# Patient Record
Sex: Male | Born: 1940
Health system: Southern US, Community
[De-identification: ages and names within clinical notes are randomized; demographics above are authoritative.]

## PROBLEM LIST (undated history)

## (undated) DIAGNOSIS — J449 Chronic obstructive pulmonary disease, unspecified: Secondary | ICD-10-CM

## (undated) DIAGNOSIS — F419 Anxiety disorder, unspecified: Secondary | ICD-10-CM

## (undated) DIAGNOSIS — E785 Hyperlipidemia, unspecified: Secondary | ICD-10-CM

## (undated) DIAGNOSIS — R0902 Hypoxemia: Secondary | ICD-10-CM

## (undated) DIAGNOSIS — I2510 Atherosclerotic heart disease of native coronary artery without angina pectoris: Secondary | ICD-10-CM

## (undated) DIAGNOSIS — I219 Acute myocardial infarction, unspecified: Secondary | ICD-10-CM

## (undated) DIAGNOSIS — R0602 Shortness of breath: Secondary | ICD-10-CM

## (undated) DIAGNOSIS — N183 Chronic kidney disease, stage 3 unspecified: Secondary | ICD-10-CM

## (undated) DIAGNOSIS — I10 Essential (primary) hypertension: Secondary | ICD-10-CM

## (undated) DIAGNOSIS — Z9981 Dependence on supplemental oxygen: Secondary | ICD-10-CM

## (undated) DIAGNOSIS — F32A Depression, unspecified: Secondary | ICD-10-CM

## (undated) DIAGNOSIS — K219 Gastro-esophageal reflux disease without esophagitis: Secondary | ICD-10-CM

## (undated) DIAGNOSIS — F329 Major depressive disorder, single episode, unspecified: Secondary | ICD-10-CM

## (undated) DIAGNOSIS — I209 Angina pectoris, unspecified: Secondary | ICD-10-CM

## (undated) DIAGNOSIS — N400 Enlarged prostate without lower urinary tract symptoms: Secondary | ICD-10-CM

## (undated) HISTORY — DX: Hypoxemia: R09.02

## (undated) HISTORY — DX: Atherosclerotic heart disease of native coronary artery without angina pectoris: I25.10

## (undated) HISTORY — PX: OTHER SURGICAL HISTORY: SHX169

## (undated) HISTORY — PX: CATARACT EXTRACTION W/ INTRAOCULAR LENS  IMPLANT, BILATERAL: SHX1307

---

## 1998-04-16 ENCOUNTER — Emergency Department (HOSPITAL_COMMUNITY): Admission: EM | Admit: 1998-04-16 | Discharge: 1998-04-16 | Payer: Self-pay | Admitting: Emergency Medicine

## 1998-04-19 ENCOUNTER — Encounter (HOSPITAL_COMMUNITY): Admission: RE | Admit: 1998-04-19 | Discharge: 1998-07-18 | Payer: Self-pay

## 1999-07-24 ENCOUNTER — Other Ambulatory Visit: Admission: RE | Admit: 1999-07-24 | Discharge: 1999-07-24 | Payer: Self-pay | Admitting: Urology

## 2002-05-07 ENCOUNTER — Emergency Department (HOSPITAL_COMMUNITY): Admission: EM | Admit: 2002-05-07 | Discharge: 2002-05-07 | Payer: Self-pay | Admitting: Emergency Medicine

## 2002-05-07 ENCOUNTER — Encounter: Payer: Self-pay | Admitting: Emergency Medicine

## 2002-06-16 ENCOUNTER — Encounter: Payer: Self-pay | Admitting: *Deleted

## 2002-06-16 ENCOUNTER — Ambulatory Visit (HOSPITAL_COMMUNITY): Admission: RE | Admit: 2002-06-16 | Discharge: 2002-06-16 | Payer: Self-pay | Admitting: *Deleted

## 2002-06-22 ENCOUNTER — Ambulatory Visit (HOSPITAL_COMMUNITY): Admission: RE | Admit: 2002-06-22 | Discharge: 2002-06-22 | Payer: Self-pay | Admitting: *Deleted

## 2002-06-22 ENCOUNTER — Encounter: Payer: Self-pay | Admitting: *Deleted

## 2005-04-16 DIAGNOSIS — I2510 Atherosclerotic heart disease of native coronary artery without angina pectoris: Secondary | ICD-10-CM

## 2005-04-16 HISTORY — DX: Atherosclerotic heart disease of native coronary artery without angina pectoris: I25.10

## 2005-05-10 ENCOUNTER — Inpatient Hospital Stay (HOSPITAL_COMMUNITY): Admission: EM | Admit: 2005-05-10 | Discharge: 2005-05-12 | Payer: Self-pay | Admitting: Emergency Medicine

## 2005-05-11 HISTORY — PX: CORONARY ANGIOPLASTY WITH STENT PLACEMENT: SHX49

## 2007-05-28 ENCOUNTER — Encounter: Admission: RE | Admit: 2007-05-28 | Discharge: 2007-05-28 | Payer: Self-pay | Admitting: Interventional Cardiology

## 2007-12-30 ENCOUNTER — Emergency Department (HOSPITAL_COMMUNITY): Admission: EM | Admit: 2007-12-30 | Discharge: 2007-12-31 | Payer: Self-pay | Admitting: Physician Assistant

## 2008-04-16 HISTORY — PX: REFRACTIVE SURGERY: SHX103

## 2009-10-18 ENCOUNTER — Emergency Department (HOSPITAL_COMMUNITY): Admission: EM | Admit: 2009-10-18 | Discharge: 2009-10-19 | Payer: Self-pay | Admitting: Emergency Medicine

## 2010-07-02 LAB — DIFFERENTIAL
Basophils Relative: 1 % (ref 0–1)
Lymphocytes Relative: 25 % (ref 12–46)
Lymphs Abs: 2.3 10*3/uL (ref 0.7–4.0)
Neutro Abs: 5.2 10*3/uL (ref 1.7–7.7)
Neutrophils Relative %: 58 % (ref 43–77)

## 2010-07-02 LAB — COMPREHENSIVE METABOLIC PANEL
BUN: 22 mg/dL (ref 6–23)
CO2: 28 mEq/L (ref 19–32)
Calcium: 8.8 mg/dL (ref 8.4–10.5)
Chloride: 108 mEq/L (ref 96–112)
Creatinine, Ser: 0.89 mg/dL (ref 0.4–1.5)
GFR calc Af Amer: >60 mL/min (ref >60)
GFR calc non Af Amer: >60 mL/min (ref >60)
Glucose, Bld: 87 mg/dL (ref 70–99)
Total Bilirubin: 0.6 mg/dL (ref 0.3–1.2)

## 2010-07-02 LAB — URINALYSIS, ROUTINE W REFLEX MICROSCOPIC
Bilirubin Urine: NEGATIVE
Hgb urine dipstick: NEGATIVE
Protein, ur: NEGATIVE mg/dL
Specific Gravity, Urine: 1.02 (ref 1.005–1.030)
Urobilinogen, UA: 1 mg/dL (ref 0.0–1.0)

## 2010-07-02 LAB — CBC
HCT: 45.6 % (ref 39.0–52.0)
Hemoglobin: 15.9 g/dL (ref 13.0–17.0)
MCH: 30.6 pg (ref 26.0–34.0)
MCHC: 34.8 g/dL (ref 30.0–36.0)
MCV: 88 fL (ref 78.0–100.0)

## 2010-09-01 NOTE — H&P (Signed)
NAME:  Mitchell [name], Mitchell [name] NO.:  [id number]   MEDICAL RECORD NO.:  [id number]          PATIENT TYPE:  INP   LOCATION:  1833                         FACILITY:  [facility]   PHYSICIAN:  Lyn Records, M.D.   DATE OF BIRTH:  14-Feb-1941   DATE OF ADMISSION:  05/10/2005  DATE OF DISCHARGE:                                HISTORY & PHYSICAL   HISTORY OF PRESENT ILLNESS:  Mitchell [name] is a 70 year old Caucasian male with  a history of chest pain, status post diagnostic cardiac catheterization with  an approximately 60% blockage, per patient, 6 years ago, hypertension,  dyslipidemia, and depression.  He admits to recent family stressors and the  onset of intermittent chest pain at rest on Sunday.  The chest pain is  located in his left breast, to the left of his sternum and across his left  breast.  It is also located in his left back when he is sitting in a chair  without cushions.  He describes the pain as someone is stomping on my  chest, rates the pain a 7-8/10, and denies radiation.  He admits to  shortness of breath associated with the patient; however, denies  diaphoresis, nausea, vomiting, dizziness and syncope.  Mitchell [name] presented  to the [facility] Cardiology office this morning without an appointment, and was  advised to go to the [facility] emergency department.  Once triaged, he was  given nitroglycerin with pain improvement.  The nitroglycerin did not  totally relieve the pain.  The patient denies that anything is wrong with  him from a health perspective, and is anxious to leave the hospital to drive  to [facility].  In April of 1998, he had a normal Cardiolite exercise  stress test with an LVEF of 63%.   PAST MEDICAL HISTORY:  1.  Chest pain.  2.  Hypertension.  3.  Dyslipidemia.  4.  Depression.  5.  Status post diagnostic catheterization with approximately 60% blockage      per patient.  6.  Medication noncompliance.   ALLERGIES:  No known drug  allergies.   MEDICATIONS:  Aspirin 325 mg daily.   PAST SURGICAL HISTORY:  Ganglion cyst removal.   FAMILY HISTORY:  Significant for coronary artery disease status post 7  vessel CABG in his brother.  CVA in his father.  MI in his mother.  Hypertension and diabetes mellitus.   SOCIAL HISTORY:  Mitchell [name] is divorced.  He is semi-retired as a Veterinary surgeon.  He denies work since August of 2006.  He lives alone in  [place].  He has 3 adult sons and 3 grandchildren.  He admits to  occasional tobacco use in that he smokes a pipe.  He admits to 1-2 pints of  alcohol per day; however, denies illicit drug use.   REVIEW OF SYSTEMS:  He admits to chest pain and shortness of breath.  He  denies diaphoresis, nausea, vomiting, dizziness and syncope.  All other  systems reviewed are otherwise negative, other than what is stated in the  HPI.   PHYSICAL  EXAMINATION:  GENERAL:  Mitchell [name] is a 70 year old male who was  cooperative, anxious to leave the hospital, and in no acute distress.  VITAL SIGNS:  Temperature 97.5, blood pressure 148/84, pulse 74,  respirations 18, O2 saturations 98% over 2 liters of oxygen.  HEENT:  Unremarkable.  NECK:  Supple without carotid bruits or JVD.  HEART:  Regular rate and rhythm.  S1 and S2 normal without murmurs, gallops,  clicks, rubs.  LUNGS:  Expiratory wheezes noted.  ABDOMEN:  Soft, nontender, nontender, with active bowel sounds.  No aortic  or iliac bruits noted.  No masses or hepatosplenomegaly.  EXTREMITIES:  No peripheral edema.  DP pulses 2+/2 bilaterally.  SKIN:  Warm and dry without rashes or lesions.  NEUROLOGIC:  Alert and oriented x3.  No focal deficits.  PSYCHIATRIC:  Anxious mood.  Flat affect.   LABORATORY DATA:  White blood count 7.0, hemoglobin 16.5, hematocrit 47.5%,  platelets 186.  BMET revealed a sodium of 137, chloride 108, potassium 4.0,  CO2 of 24.4, BUN 19, creatinine 1.0, glucose 92.  Point of care enzymes x1   revealed myoglobin of 85.8, CK-MB 1.6, troponin I less than 0.05.   EKG revealed normal sinus rhythm with frequent PVC's.  Ventricular rate 80  beats per minute.  Chest x-ray revealed mild diffuse peribronchial  thickening.  No cardiomegaly or edema.  No active cardiopulmonary disease.   ASSESSMENT:  1.  Chest pain.  2.  Shortness of breath.  3.  Hypertension.  4.  Anxiety.  5.  Gastroesophageal reflux disease.  6.  Alcohol abuse.   PLAN:  1.  Admit the patient to the telemetry unit.  2.  Start IV nitroglycerin drip at 10 mcg per minute.  3.  Start oxygen at 2 liters per minute.  4.  Bed rest with bathroom privileges.  5.  Check serial cardiac enzymes.  6.  Start Protonix 40 mg daily.  7.  Precardiac catheterization orders initiated.  8.  Diagnostic left heart cardiac catheterization has been scheduled for      Friday, May 11, 2005.  9.  Check CBC with differential, CMET, EKG x2, PT, INR, MG, FLP in a.m.  10. Start subcutaneous Lovenox every 12 hours per pharmacy protocol.  11. The patient has been seen and examined by Dr. Verdis Prime, III, M.D.,      who participated in the medical decision making and plan of care.      Tylene Fantasia, [place]      Lyn Records, M.D.  Electronically Signed    RDM/MEDQ  D:  05/10/2005  T:  05/10/2005  Job:  [id number]

## 2010-09-01 NOTE — Discharge Summary (Signed)
NAME:  Mitchell [name], Mitchell [name] NO.:  [id number]   MEDICAL RECORD NO.:  [id number]          PATIENT TYPE:  INP   LOCATION:  6532                         FACILITY:  [facility]   PHYSICIAN:  Francisca December, M.D.  DATE OF BIRTH:  [birth year]   DATE OF ADMISSION:  05/10/2005  DATE OF DISCHARGE:  05/12/2005                                 DISCHARGE SUMMARY   DISCHARGE DIAGNOSES:  1.  Chest pain, resolved.  2.  Coronary artery disease, status post percutaneous intervention to the      left anterior descending and diagonal on May 11, 2005 by Dr. Verdis Prime.  3.  Hypertension.  4.  Dyslipidemia, treated.  5.  Depression.  6.  History of medication noncompliance.  7.  Family history of coronary artery disease.   Mr. Kidd is a 70 year old male patient who presented to the emergency room  on May 10, 2005 complaining of intermittent chest pain at rest.  Serial  cardiac isoenzymes were negative; however, with the patient's history of  mild coronary artery disease at prior catheterization in 1998, plans were  made for him to undergo cardiac catheterization on May 11, 2005.   Cardiac catheterization revealed angiographically normal left main  circumflex and right coronary artery.  He had a 90% LAD lesion/80% diagonal  lesion that required PCI of these lesions at the bifurcation utilizing  cutting balloon followed by kissing balloon angioplasty.  The patient  tolerated the procedure well and the following day was ready for discharge  to home.   Lab studies during the patient's hospitalization included a white count of  9.2, hemoglobin 16.2, hematocrit 45.8, platelets 211.  Sodium 134, potassium  4.7, BUN 17, creatinine 1.2.  Cardiac isoenzymes were negative.  Total  cholesterol 176, triglycerides 219, HDL 39, LDL 94.   DISCHARGE MEDICATIONS:  1.  Lipitor 40 mg a day.  2.  Toprol XL 50 mg a day.  3.  Enteric-coated aspirin 325 mg a day.  4.  Plavix 75 mg a day.  5.  Sublingual nitroglycerin p.r.n. chest pain.   He is not to drive for two days.  No lifting over ten pounds for one week.  Remain on a low fat diet.  Do not go back to work until seen by Dr. Katrinka Blazing.  He has an appointment with Dr. Katrinka Blazing on May 29, 2005 at 1:30 p.m.  He  is to call for any recurrent chest discomfort.      Guy Franco, P.A.      Francisca December, M.D.  Electronically Signed    LB/MEDQ  D:  06/27/2005  T:  06/28/2005  Job:  [id number]   cc:   Lyn Records, M.D.  Fax: [phone]

## 2010-09-01 NOTE — Cardiovascular Report (Signed)
NAME:  Mitchell [name], Mitchell [name] NO.:  [id number]   MEDICAL RECORD NO.:  [id number]          PATIENT TYPE:  INP   LOCATION:  3715                         FACILITY:  [facility]   PHYSICIAN:  Lyn Records, M.D.   DATE OF BIRTH:  [birth year]   DATE OF PROCEDURE:  DATE OF DISCHARGE:                              CARDIAC CATHETERIZATION   The patient has no primary care physician.   INDICATIONS:  Recurrent episodes of chest discomfort and right shoulder  discomfort. The patient with risk factors for coronary disease. No evidence  of myocardial infarction.   PROCEDURE PERFORMED:  1.  Left heart cath.  2.  __________  3.  Left ventriculography.  4.  PTCA with cutting balloon angioplasty and kissing balloon on the LAD      diagonal bifurcation lesion.  5.  Angio-Seal.   DESCRIPTION:  After informed consent, a 6-French sheath was placed in the  right femoral artery using modified Seldinger technique. A 6-French A2  multipurpose catheter was used for hemodynamic recordings, left  ventriculography by hand injection, selective left and right coronary  angiography. The patient tolerated the procedure without complications.   Review of the cine digital images demonstrated a high-grade mid LAD  bifurcation lesion with 90% stenosis in the LAD which is tiny and diffusely  diseased beyond the bifurcation and a larger diagonal at the bifurcation had  an ostial 80% stenosis. This was diagonal number two. Because the circumflex  and right coronary were normal, it was felt that if the patient's symptoms  are cardiac, they were due to his LAD lesion. After being with the patient  we decided to proceed with PCI. I discussed this case with Dr. Eldridge Dace and  Dr. Arminda Resides concerning approach.  He felt that if stenting was necessary  that it should probably occur from the LAD into the diagonal and rescue the  LAD because the LAD was small. After some contemplation, I felt that perhaps  the best  approach would be balloon angioplasty and not stenting if we get an  acceptable result.   We used heparin and Integrilin.  The patient was also enrolled in the  Zion trial. We used Integrilin and IV heparin. Alla Feeling study drug was  started. We used a Diastat and a #3 XP left guide catheter. We used a BMW  into the diagonal and a Clinical cytogeneticist into the LAD. After documenting an  ACT greater than 240-seconds, we dilated the LAD using a 15 mm long 2.0 mm  Maverick balloon. We then used a cutting balloon on the focal region of the  LAD at the bifurcation with the diagonal where recoil was occurring. This  was a 2.25 x 6 mm cutting balloon. We then performed cutting balloon  angioplasty on the large diagonal with a 2.75 x 6 mm cutting balloon.  Each  cutting balloon inflation was for 2 minutes in each vessel territory. We  then performed kissing balloon angioplasty using a 2.25 x 20 mm Quantum and  the LAD and a 2.75 x 12 mm Quantum in the diagonal.  Balloon inflation  pressures were 8 atmospheres.  The kiss was held for 2 minutes and the final  result, we felt was acceptable.  TIMI grade 3 flow was noted.  PCT was  documented to still be greater than 300.   PROCEDURE:  Angio-Seal arteriotomy closure was performed after documenting  appropriate anatomy.  Good hemostasis was achieved.   RESULTS:  1.  Hemodynamic data.      1.  Aortic pressure 136/83.      2.  Left ventricular pressure was 137/80.  2.  Left ventriculography: LV cavity size is normal. LVEF is normal at 60%.      No mitral regurgitation.  3.  Coronary angiography.      1.  Left main coronary:  Short, no obstruction.      2.  Left anterior descending coronary:  Relatively small vascular          territory with two large diagonals. The continuation of the LAD          after the second diagonal is relatively small in diameter and is          diffusely diseased up to 90% at its bifurcation with the second           diagonal. There is a more focal 80% stenosis in the LAD beyond the          bifurcation contained within a region of generalized diffuse          disease. The large second diagonal contains ostial 80% narrowing. No          other significant obstruction is noted in the LAD. The remainder of          the proximal LAD contains irregularities but no high-grade          obstruction.      3.  Circumflex artery: This gives origin to one large obtuse marginal          and contains no significant obstruction.      4.  Right coronary: The right coronary gives origin to a large PDA and          two large left ventricular branches. Minimal luminal irregularities          are noted. The PDA reaches the left ventricular apex and wraps          around.  4.  PCI: LAD at the bifurcation, 90% diagonal, two at the bifurcation 80%      and reduced to less than 40% and less than 30% with TIMI grade 3 flow      respectively.   CONCLUSIONS:  1.  Large normal RCA and moderate-sized circumflex is normal.  2.  High-grade bifurcation disease in the mid LAD involving the second      diagonal and the LAD with LAD containing a 90% stenosis with diffuse 80%      disease beyond the bifurcation and the ostium of the large diagonal      containing an 80% stenosis.  3.  Successful cutting balloon followed by kissing balloon angioplasty on      the bifurcation lesion with less than 40% stenosis noted in each      territory and TIMI grade 3 flow.  4.  Normal LV function.   PLAN:  Aspirin and Plavix. Plavix will be continued for least a week. Risk  factor modification by adding lipid lowering therapy. Will also add a beta  blocker. We will request smoking cessation consultation  and cardiac rehab  consultation.      Lyn Records, M.D.  Electronically Signed     HWS/MEDQ  D:  05/11/2005  T:  05/11/2005  Job:  [id number]   cc:   Lyn Records, M.D.  Fax: [phone]

## 2011-01-15 LAB — URINALYSIS, ROUTINE W REFLEX MICROSCOPIC
Bilirubin Urine: NEGATIVE
Hgb urine dipstick: NEGATIVE
Ketones, ur: NEGATIVE
Protein, ur: NEGATIVE
Urobilinogen, UA: 0.2

## 2011-01-15 LAB — DIFFERENTIAL
Basophils Relative: 1
Eosinophils Absolute: 0.3
Monocytes Absolute: 0.8
Monocytes Relative: 10
Neutrophils Relative %: 58

## 2011-01-15 LAB — POCT I-STAT, CHEM 8
Calcium, Ion: 1.16
Creatinine, Ser: 1
Glucose, Bld: 92
Hemoglobin: 16.3
TCO2: 23

## 2011-01-15 LAB — CBC
MCHC: 34.9
MCV: 86.8
RBC: 5.22

## 2011-04-29 ENCOUNTER — Encounter (HOSPITAL_COMMUNITY): Payer: Self-pay | Admitting: *Deleted

## 2011-04-29 ENCOUNTER — Inpatient Hospital Stay (HOSPITAL_COMMUNITY)
Admission: EM | Admit: 2011-04-29 | Discharge: 2011-05-10 | DRG: 153 | Disposition: A | Discharge status: Home / Self Care | Payer: Medicare HMO | Attending: Family Medicine | Admitting: Family Medicine

## 2011-04-29 ENCOUNTER — Emergency Department (HOSPITAL_COMMUNITY): Payer: Medicare HMO

## 2011-04-29 DIAGNOSIS — Z9861 Coronary angioplasty status: Secondary | ICD-10-CM

## 2011-04-29 DIAGNOSIS — F3289 Other specified depressive episodes: Secondary | ICD-10-CM | POA: Diagnosis present

## 2011-04-29 DIAGNOSIS — R0600 Dyspnea, unspecified: Secondary | ICD-10-CM

## 2011-04-29 DIAGNOSIS — Z7982 Long term (current) use of aspirin: Secondary | ICD-10-CM

## 2011-04-29 DIAGNOSIS — R0902 Hypoxemia: Secondary | ICD-10-CM | POA: Diagnosis present

## 2011-04-29 DIAGNOSIS — I10 Essential (primary) hypertension: Secondary | ICD-10-CM

## 2011-04-29 DIAGNOSIS — R0602 Shortness of breath: Secondary | ICD-10-CM

## 2011-04-29 DIAGNOSIS — N400 Enlarged prostate without lower urinary tract symptoms: Secondary | ICD-10-CM | POA: Diagnosis present

## 2011-04-29 DIAGNOSIS — Z79899 Other long term (current) drug therapy: Secondary | ICD-10-CM

## 2011-04-29 DIAGNOSIS — J101 Influenza due to other identified influenza virus with other respiratory manifestations: Secondary | ICD-10-CM | POA: Diagnosis present

## 2011-04-29 DIAGNOSIS — K5900 Constipation, unspecified: Secondary | ICD-10-CM | POA: Diagnosis not present

## 2011-04-29 DIAGNOSIS — J111 Influenza due to unidentified influenza virus with other respiratory manifestations: Principal | ICD-10-CM | POA: Diagnosis present

## 2011-04-29 DIAGNOSIS — E785 Hyperlipidemia, unspecified: Secondary | ICD-10-CM | POA: Diagnosis present

## 2011-04-29 DIAGNOSIS — R5381 Other malaise: Secondary | ICD-10-CM | POA: Diagnosis present

## 2011-04-29 DIAGNOSIS — J449 Chronic obstructive pulmonary disease, unspecified: Secondary | ICD-10-CM | POA: Diagnosis present

## 2011-04-29 DIAGNOSIS — B370 Candidal stomatitis: Secondary | ICD-10-CM | POA: Diagnosis not present

## 2011-04-29 DIAGNOSIS — M7981 Nontraumatic hematoma of soft tissue: Secondary | ICD-10-CM | POA: Diagnosis not present

## 2011-04-29 DIAGNOSIS — J4489 Other specified chronic obstructive pulmonary disease: Secondary | ICD-10-CM | POA: Diagnosis present

## 2011-04-29 DIAGNOSIS — Z87891 Personal history of nicotine dependence: Secondary | ICD-10-CM

## 2011-04-29 DIAGNOSIS — G4733 Obstructive sleep apnea (adult) (pediatric): Secondary | ICD-10-CM | POA: Diagnosis present

## 2011-04-29 DIAGNOSIS — I2510 Atherosclerotic heart disease of native coronary artery without angina pectoris: Secondary | ICD-10-CM | POA: Diagnosis present

## 2011-04-29 DIAGNOSIS — F329 Major depressive disorder, single episode, unspecified: Secondary | ICD-10-CM | POA: Diagnosis present

## 2011-04-29 HISTORY — DX: Gastro-esophageal reflux disease without esophagitis: K21.9

## 2011-04-29 HISTORY — DX: Depression, unspecified: F32.A

## 2011-04-29 HISTORY — DX: Benign prostatic hyperplasia without lower urinary tract symptoms: N40.0

## 2011-04-29 HISTORY — DX: Essential (primary) hypertension: I10

## 2011-04-29 HISTORY — DX: Hyperlipidemia, unspecified: E78.5

## 2011-04-29 HISTORY — DX: Shortness of breath: R06.02

## 2011-04-29 HISTORY — DX: Major depressive disorder, single episode, unspecified: F32.9

## 2011-04-29 HISTORY — DX: Anxiety disorder, unspecified: F41.9

## 2011-04-29 LAB — URINE CULTURE
Colony Count: 5000
Culture  Setup Time: 201301140228

## 2011-04-29 LAB — URINALYSIS, ROUTINE W REFLEX MICROSCOPIC
Glucose, UA: NEGATIVE mg/dL
Hgb urine dipstick: NEGATIVE
Ketones, ur: 15 mg/dL — AB
Leukocytes, UA: NEGATIVE
Protein, ur: 100 mg/dL — AB
pH: 5.5 (ref 5.0–8.0)

## 2011-04-29 LAB — URINE MICROSCOPIC-ADD ON

## 2011-04-29 LAB — CBC
MCH: 29.7 pg (ref 26.0–34.0)
MCV: 88.9 fL (ref 78.0–100.0)
Platelets: 121 10*3/uL — ABNORMAL LOW (ref 150–400)
RDW: 14.3 % (ref 11.5–15.5)

## 2011-04-29 LAB — DIFFERENTIAL
Basophils Absolute: 0 10*3/uL (ref 0.0–0.1)
Eosinophils Absolute: 0 10*3/uL (ref 0.0–0.7)
Eosinophils Relative: 0 % (ref 0–5)
Lymphs Abs: 0.5 10*3/uL — ABNORMAL LOW (ref 0.7–4.0)
Monocytes Absolute: 0.8 10*3/uL (ref 0.1–1.0)

## 2011-04-29 LAB — BASIC METABOLIC PANEL
BUN: 16 mg/dL (ref 6–23)
Calcium: 8.8 mg/dL (ref 8.4–10.5)
GFR calc non Af Amer: 74 mL/min — ABNORMAL LOW (ref >90)
Glucose, Bld: 94 mg/dL (ref 70–99)

## 2011-04-29 MED ORDER — IPRATROPIUM BROMIDE 0.02 % IN SOLN
0.5000 mg | Freq: Once | RESPIRATORY_TRACT | Status: AC
  Administered 2011-04-29: 0.5 mg via RESPIRATORY_TRACT

## 2011-04-29 MED ORDER — ALBUTEROL SULFATE (5 MG/ML) 0.5% IN NEBU
2.5000 mg | INHALATION_SOLUTION | Freq: Once | RESPIRATORY_TRACT | Status: DC
  Administered 2011-04-29: 2.5 mg via RESPIRATORY_TRACT

## 2011-04-29 MED ORDER — IPRATROPIUM BROMIDE 0.02 % IN SOLN: 1.0000 mg | Freq: Once | RESPIRATORY_TRACT | Status: DC

## 2011-04-29 MED ORDER — ASPIRIN EC 81 MG PO TBEC
81.0000 mg | DELAYED_RELEASE_TABLET | Freq: Every day | ORAL | Status: DC
  Administered 2011-04-30: 81 mg via ORAL
  Administered 2011-05-01 – 2011-05-04 (×4): 162 mg via ORAL
  Administered 2011-05-05 – 2011-05-08 (×4): 81 mg via ORAL
  Administered 2011-05-09: 162 mg via ORAL
  Administered 2011-05-10: 81 mg via ORAL
  Filled 2011-04-29 (×12): qty 2

## 2011-04-29 MED ORDER — ACETAMINOPHEN 500 MG PO TABS
1000.0000 mg | ORAL_TABLET | Freq: Once | ORAL | Status: AC
  Administered 2011-04-29: 1000 mg via ORAL
  Filled 2011-04-29: qty 2

## 2011-04-29 MED ORDER — FINASTERIDE 5 MG PO TABS
5.0000 mg | ORAL_TABLET | Freq: Every day | ORAL | Status: DC
  Administered 2011-04-30 – 2011-05-10 (×11): 5 mg via ORAL
  Filled 2011-04-29 (×12): qty 1

## 2011-04-29 MED ORDER — ATENOLOL 25 MG PO TABS
25.0000 mg | ORAL_TABLET | Freq: Every day | ORAL | Status: DC
  Administered 2011-04-30 – 2011-05-02 (×3): 25 mg via ORAL
  Filled 2011-04-29 (×4): qty 1

## 2011-04-29 MED ORDER — IPRATROPIUM BROMIDE 0.02 % IN SOLN
RESPIRATORY_TRACT | Status: AC
  Administered 2011-04-29: 0.5 mg via RESPIRATORY_TRACT
  Filled 2011-04-29: qty 2.5

## 2011-04-29 MED ORDER — ASPIRIN 81 MG PO CHEW
324.0000 mg | CHEWABLE_TABLET | Freq: Once | ORAL | Status: AC
  Administered 2011-04-29: 324 mg via ORAL
  Filled 2011-04-29: qty 4

## 2011-04-29 MED ORDER — ALBUTEROL SULFATE (5 MG/ML) 0.5% IN NEBU
INHALATION_SOLUTION | RESPIRATORY_TRACT | Status: AC
  Administered 2011-04-29: 2.5 mg via RESPIRATORY_TRACT
  Filled 2011-04-29: qty 1

## 2011-04-29 MED ORDER — ALBUTEROL SULFATE (5 MG/ML) 0.5% IN NEBU
5.0000 mg | INHALATION_SOLUTION | Freq: Once | RESPIRATORY_TRACT | Status: AC
  Administered 2011-04-29: 5 mg via RESPIRATORY_TRACT
  Filled 2011-04-29: qty 2

## 2011-04-29 MED ORDER — IOHEXOL 300 MG/ML  SOLN
100.0000 mL | Freq: Once | INTRAMUSCULAR | Status: AC | PRN
  Administered 2011-04-29: 100 mL via INTRAVENOUS

## 2011-04-29 MED ORDER — SODIUM CHLORIDE 0.9 % IV SOLN
INTRAVENOUS | Status: DC
  Administered 2011-04-29 – 2011-05-03 (×6): via INTRAVENOUS

## 2011-04-29 MED ORDER — METHYLPREDNISOLONE SODIUM SUCC 125 MG IJ SOLR
125.0000 mg | Freq: Once | INTRAMUSCULAR | Status: AC
  Administered 2011-04-29: 125 mg via INTRAVENOUS
  Filled 2011-04-29: qty 2

## 2011-04-29 MED ORDER — ALBUTEROL SULFATE (5 MG/ML) 0.5% IN NEBU
10.0000 mg | INHALATION_SOLUTION | Freq: Once | RESPIRATORY_TRACT | Status: AC
  Administered 2011-04-29: 10 mg via RESPIRATORY_TRACT

## 2011-04-29 NOTE — ED Notes (Signed)
Pt in from home with c/o sob states onset this am with no relief pt states called ems at 0300 was given O2 and felt better and refused to be transported on arrival pt anxious requesting O2 states pain in upper abd when coughing

## 2011-04-29 NOTE — H&P (Addendum)
Primary Care Physician: Verdis Prime MD (Cardiologist)   Chief Complaint: Shortness of breath for a couple days  History of Present Illness: Patient is a 71 year old gentleman history of hypertension, hyperlipidemia, left heart catheterization approximately 10 years ago with unknown results who presents for evaluation of shortness of breath. Patient reports that he was in his usual state of health until approximately 3 days prior to admission when he started to experience acute shortness of breath. Reports a small amount of rhinorrhea and nonproductive cough, although he feels as if phlegm is stuck in his chest. Denies any chest pain, palpitations, lightheadedness, or dizziness. No new nausea or vomiting. Unsure if there has been any recent sick contacts. No history of travel. Was an avid pipe smoker, recently quitting approximately a week ago given his ophthalmologist's recommendations. Symptoms progressively got worse, with patient unable to lie flat in bed. No new lower extremity edema. Patient denies any known heart disease, having only had angiogram which was apparently normal (per the patient). Has been taking his medications as directed. EMS was called last night, but the patient did not come in for further evaluation after he felt some improvement with a nebulizer treatment. However, symptoms quickly returned at which point the patient felt that he should come in for further evaluation.  In the emergency room, temperature 99.2, blood pressure 143/80, heart rate 79, respirations 18, satting 99 % on nasal cannula. CTA demonstrating retained secretions within the trachea with no pulmonary embolism seen to the large central or proximal hilar pulmonary arteries. EKG negative for ischemic changes with initial troponin negative. He was given a nebulizer treatment, aspirin 324 mg x1, and Solu-Medrol 125 mg x1. The patient was admitted for further evaluation and management.   Past Medical History     Diagnosis  Date   .  History of PTCA    .  History of cardiac cath    .  Hypertension    .  Hyperlipidemia    .  Depression     Past Surgical History   Procedure  Date   .  Cataract extraction       Allergies   Review of patient's allergies indicates no known allergies.  Home Medications    Current Outpatient Rx   Name  Route  Sig  Dispense  Refill   .  ASPIRIN EC 81 MG PO TBEC  Oral  Take 81-162 mg by mouth daily.     .  ATENOLOL 25 MG PO TABS  Oral  Take 25 mg by mouth daily.     Marland Kitchen  FINASTERIDE 5 MG PO TABS  Oral  Take 5 mg by mouth daily.       Family History   Problem  Relation  Age of Onset   .  Heart failure  Mother    .  Heart failure  Brother      Social History: Divorced many years, lives alone Recently quit pipe smoking, which he has done for many many years Reports drinking 5-6 drinks every other day or so No illicits  Review of Systems: General: As per HPI Skin: No rashes or lacerations HEENT: No rhinorrhea, sore throat, dry mouth, hearing difficulties Pulmonary: As per HPI Cardivascular: As per HPI. Of note, patient also reports progressive decrease in exercise tolerance over the past year or so. Gastrointestinal: No abdominal pain, dysphagia, odynophagia, nausea, vomiting, hematemesis, melena, hematochezia, bowel changes Genitourinary: No dysuria, hematuria, increased urinary frequency/urgency. No discharge Musculoskeletal: No muscle aches, pain. No arthritis  Hematologic: No easy bruising or bleeding Neurologic: No headaches, vision changes, focal neurologic deficits Psychologic: No suicidial or homicidal ideation. No depression  Filed Vitals:   04/29/11 1507 04/29/11 1613 04/29/11 1624 04/29/11 1639  BP: 143/80  142/85   Pulse: 79  94   Temp: 99.2 F (37.3 C)     TempSrc: Oral     Resp: 18  16   SpO2: 99% 100% 100% 100%    Physical Exam: General: Alert and oriented x 3, no apparent distress Skin: No rashes, bruises HEENT: Head  atraumatic, sclera anicertic, pupils equal and reactive to light, oropharynx moist with tonsils unremarkable Neck: Soft, no lymphadenopathy, thyromegaly, or bruits Chest: Inspiratory and expiratory wheezes. No rales or ronchi Heart: Regular rate and rhythm, normal S1/S2 no rubs, gallops, or murmurs Abdomen: Soft, nontender, obese, + bowel sounds, no masses Extremities: No cyanosis, clubbing, or edema. 2+ radial and dorsalis pedis pulses bilaterally Neurologic: Grossly intact   Labs: CBC    Component Value Date/Time   WBC 7.0 04/29/2011 1634   RBC 5.52 04/29/2011 1634   HGB 16.4 04/29/2011 1634   HCT 49.1 04/29/2011 1634   PLT 121* 04/29/2011 1634   MCV 88.9 04/29/2011 1634   MCH 29.7 04/29/2011 1634   MCHC 33.4 04/29/2011 1634   RDW 14.3 04/29/2011 1634   LYMPHSABS 0.5* 04/29/2011 1634   MONOABS 0.8 04/29/2011 1634   EOSABS 0.0 04/29/2011 1634   BASOSABS 0.0 04/29/2011 1634    BMET    Component Value Date/Time   NA 136 04/29/2011 1634   K 3.9 04/29/2011 1634   CL 99 04/29/2011 1634   CO2 28 04/29/2011 1634   GLUCOSE 94 04/29/2011 1634   BUN 16 04/29/2011 1634   CREATININE 1.00 04/29/2011 1634   CALCIUM 8.8 04/29/2011 1634   GFRNONAA 74* 04/29/2011 1634   GFRAA 85* 04/29/2011 1634    Troponin 0.00 BNP 254  Urinalysis: Cloudy with specific gravity 1.033, positive ketones and protein negative leuk esterase or white blood cells  EKG (my read): Sinus with possible left atrial type T wave abnormality. No ischemic changes  Chest radiograph: IMPRESSION:  Mild hyperinflation. No acute chest process.  CT chest: IMPRESSION:  Limited exam but no large central or proximal hilar pulmonary  embolus.  Lower trachea retained secretions.  No acute intrathoracic process, pneumonia or CHF   Impression/Plan: 71 year old gentleman history of hypertension, hyperlipidemia, left heart catheterization approximately 10 years ago with unknown results who presents for evaluation of shortness of breath.  The patient is currently afebrile and hemodynamically stable, although continues to report of shortness of breath. Differential considerations include acute bronchitis given history, with retained secretions in the trachea questionably secondary to bronchitis or retained secretions given recent tobacco cessation. However, no evidence of emphysema, heart failure, or focal pneumonia is seen on the CT.  Cough and shortness of breath: Most likely a component of reactive airway disease secondary to bronchitis/tracheitis with possible exacerbation due to recent tobacco cessation given history. However, differential considerations also include acute coronary syndrome given cardiac history and risk factors. - Admit to telemetry - Monitor serial cardiac enzymes - Flu swab, starting azithromycin for possible atypical infection - Prednisone 50 mg by mouth daily - Check TTE given history, worsening exercise tolerance over the past year, and potential cardiac consideration - Humidified oxygen and symptomatic management with nebulizers as needed  Hypertension: Slightly elevated - Continue to monitor, restarting home medications  BPH: - Continue Finasteride  Tobacco use: Patient reports having quit  for about a week - Nicotine patch  Fluid/electrolytes/nutrition: - Hep-locked IV - Monitor electrolytes daily - Cardiac diet  Prophylaxis: - Lovenox  CODE STATUS: Full code

## 2011-04-29 NOTE — ED Provider Notes (Signed)
History     CSN: [id number]  Arrival date & time 04/29/11  1445   Chief Complaint  Patient presents with  . Shortness of Breath  . Chest Pain    HPI Pt was seen at 1600.   Per pt, c/o gradual onset and worsening of persistent SOB, cough and "wheezing" for the past several days, worse since last night.  States the SOB worsens with ambulation and laying flat.  Has been sleeping in a chair for the past several days.  Has been assoc with chest "pressure" and "heaviness."  Denies palpitations, no fevers, no back pain, no abd pain, no N/V/D, no rash.      Cards:  Dr. Verdis Prime  Past Medical History  Diagnosis Date  . History of PTCA   . History of cardiac cath   . Hypertension   . Hyperlipidemia   . Depression     Past Surgical History  Procedure Date  . Cataract extraction     Family History  Problem Relation Age of Onset  . Heart failure Mother   . Heart failure Brother     History  Substance Use Topics  . Smoking status: Never Smoker   . Smokeless tobacco: Not on file  . Alcohol Use: Yes     Review of Systems ROS: Statement: All systems negative except as marked or noted in the HPI; Constitutional: Negative for fever and chills. ; ; Eyes: Negative for eye pain, redness and discharge. ; ; ENMT: Negative for ear pain, hoarseness, nasal congestion, sinus pressure and sore throat. ; ; Cardiovascular: +CP, SOB, DOE.  Negative for palpitations, diaphoresis, and peripheral edema. ; ; Respiratory: +cough, wheezing and negative for stridor. ; ; Gastrointestinal: Negative for nausea, vomiting, diarrhea, abdominal pain, blood in stool, hematemesis, jaundice and rectal bleeding. . ; ; Genitourinary: Negative for dysuria, flank pain and hematuria. ; ; Musculoskeletal: Negative for back pain and neck pain. Negative for swelling and trauma.; ; Skin: Negative for pruritus, rash, abrasions, blisters, bruising and skin lesion.; ; Neuro: Negative for headache, lightheadedness and neck  stiffness. Negative for weakness, altered level of consciousness , altered mental status, extremity weakness, paresthesias, involuntary movement, seizure and syncope.     Allergies  Review of patient's allergies indicates no known allergies.  Home Medications   Current Outpatient Rx  Name Route Sig Dispense Refill  . ASPIRIN EC 81 MG PO TBEC Oral Take 81-162 mg by mouth daily.    . ATENOLOL 25 MG PO TABS Oral Take 25 mg by mouth daily.    Marland Kitchen FINASTERIDE 5 MG PO TABS Oral Take 5 mg by mouth daily.      BP 142/85  Pulse 94  Temp(Src) 99.2 F (37.3 C) (Oral)  Resp 16  SpO2 100%  Physical Exam 1605: Physical examination:  Nursing notes reviewed; Vital signs and O2 SAT reviewed;  Constitutional: Well developed, Well nourished, Well hydrated, In no acute distress; Head:  Normocephalic, atraumatic; Eyes: EOMI, PERRL, No scleral icterus; ENMT: Mouth and pharynx normal, Mucous membranes moist; Neck: Supple, Full range of motion, No lymphadenopathy; Cardiovascular: Regular rate and rhythm, No murmur, rub, or gallop; Respiratory: Breath sounds coarse & equal bilaterally, with insp/exp wheezes bilat and occas audible wheezing.  Speaking full sentences. Normal respiratory effort/excursion; Chest: Nontender, Movement normal; Abdomen: Soft, Nontender, Nondistended, Normal bowel sounds;; Extremities: Pulses normal, No tenderness, No edema, No calf edema or asymmetry.; Neuro: AA&Ox3, Major CN grossly intact.  No gross focal motor or sensory deficits in extremities.; Skin:  Color normal, Warm, Dry, no rash.    ED Course  Procedures  1910:  Pt feels somewhat better after hour long neb and solumedrol, but still tells staff that he still feels SOB without the N/C O2.  Sats 100% on O2 2L N/C, resps appear without distress.  +anxious.  BNP mildly elevated, but no overt CHF on CXR.  Will check CT-A chest for r/o PE, CHF, PNA.  Pt agreeable.   2100:   CTA chest without evidence of PE, PNA or CHF.  T/C to [facility],  case discussed, including:  HPI, pertinent PM/SHx, VS/PE, dx testing, ED course and treatment.  Agreeable to admit.  Requests to obtain tele bed to team 5/Dr. Ashley Royalty.   MDM  MDM Reviewed: nursing note, vitals and previous chart Reviewed previous: ECG Interpretation: labs, ECG and x-ray    Date: 04/29/2011  Rate: 81  Rhythm: normal sinus rhythm  QRS Axis: normal  Intervals: normal  ST/T Wave abnormalities: normal  Conduction Disutrbances:none  Narrative Interpretation:   Old EKG Reviewed: unchanged; no significant changes from previous EKG dated 10/18/2009.  Results for orders placed during the hospital encounter of 04/29/11  BASIC METABOLIC PANEL      Component Value Range   Sodium 136  135 - 145 (mEq/L)   Potassium 3.9  3.5 - 5.1 (mEq/L)   Chloride 99  96 - 112 (mEq/L)   CO2 28  19 - 32 (mEq/L)   Glucose, Bld 94  70 - 99 (mg/dL)   BUN 16  6 - 23 (mg/dL)   Creatinine, Ser 1.61  0.50 - 1.35 (mg/dL)   Calcium 8.8  8.4 - 09.6 (mg/dL)   GFR calc non Af Amer 74 (*) >90 (mL/min)   GFR calc Af Amer 85 (*) >90 (mL/min)  PRO B NATRIURETIC PEPTIDE      Component Value Range   Pro B Natriuretic peptide (BNP) 257.2 (*) 0 - 125 (pg/mL)  CBC      Component Value Range   WBC 7.0  4.0 - 10.5 (K/uL)   RBC 5.52  4.22 - 5.81 (MIL/uL)   Hemoglobin 16.4  13.0 - 17.0 (g/dL)   HCT 04.5  40.9 - 81.1 (%)   MCV 88.9  78.0 - 100.0 (fL)   MCH 29.7  26.0 - 34.0 (pg)   MCHC 33.4  30.0 - 36.0 (g/dL)   RDW 91.4  78.2 - 95.6 (%)   Platelets 121 (*) 150 - 400 (K/uL)  DIFFERENTIAL      Component Value Range   Neutrophils Relative 81 (*) 43 - 77 (%)   Neutro Abs 5.6  1.7 - 7.7 (K/uL)   Lymphocytes Relative 7 (*) 12 - 46 (%)   Lymphs Abs 0.5 (*) 0.7 - 4.0 (K/uL)   Monocytes Relative 11  3 - 12 (%)   Monocytes Absolute 0.8  0.1 - 1.0 (K/uL)   Eosinophils Relative 0  0 - 5 (%)   Eosinophils Absolute 0.0  0.0 - 0.7 (K/uL)   Basophils Relative 0  0 - 1 (%)   Basophils Absolute 0.0  0.0 - 0.1 (K/uL)    URINALYSIS, ROUTINE W REFLEX MICROSCOPIC      Component Value Range   Color, Urine AMBER (*) YELLOW    APPearance CLOUDY (*) CLEAR    Specific Gravity, Urine 1.033 (*) 1.005 - 1.030    pH 5.5  5.0 - 8.0    Glucose, UA NEGATIVE  NEGATIVE (mg/dL)   Hgb urine dipstick NEGATIVE  NEGATIVE  Bilirubin Urine NEGATIVE  NEGATIVE    Ketones, ur 15 (*) NEGATIVE (mg/dL)   Protein, ur 147 (*) NEGATIVE (mg/dL)   Urobilinogen, UA 1.0  0.0 - 1.0 (mg/dL)   Nitrite NEGATIVE  NEGATIVE    Leukocytes, UA NEGATIVE  NEGATIVE   POCT I-STAT TROPONIN I      Component Value Range   Troponin i, poc 0.00  0.00 - 0.08 (ng/mL)   Comment 3           URINE MICROSCOPIC-ADD ON      Component Value Range   WBC, UA 0-2  <3 (WBC/hpf)   Bacteria, UA RARE  RARE    Casts HYALINE CASTS (*) NEGATIVE    Urine-Other MUCOUS PRESENT     Ct Angio Chest W/cm &/or Wo Cm 04/29/2011  *RADIOLOGY REPORT*  Clinical Data: Shortness of breath, cough, chest pain  CT ANGIOGRAPHY CHEST  Technique:  Multidetector CT imaging of the chest using the standard protocol during bolus administration of intravenous contrast. Multiplanar reconstructed images including MIPs were obtained and reviewed to evaluate the vascular anatomy.  Contrast: OMNIPAQUE IOHEXOL 300 MG/ML IV SOLN  Comparison: 04/29/2011 chest x-ray  Findings: Limited contrast opacification of the pulmonary arteries. Within the limits of the study, there is no large central or proximal hilar pulmonary embolus demonstrated.  Difficult to exclude smaller segmental or subsegmental pulmonary emboli.  Mild atherosclerosis of the thoracic aorta.  Negative for aneurysm or dissection.  Normal heart size.  No pericardial or pleural effusion.  No hiatal hernia.  Mildly prominent subcarinal lymph nodes, likely reactive.  No definite adenopathy.  Retained secretions present in the lower trachea, images 32-39.  No acute finding in the upper abdomen imaged.  Thoracic spondylosis and degenerative  changes noted.  Lung windows demonstrate mild hyperinflation.  Motion artifact in the lower lobes.  No focal pneumonia, collapse, consolidation, airspace process, pneumonia, or interstitial changes.  IMPRESSION: Limited exam but no large central or proximal hilar pulmonary embolus.  Lower trachea retained secretions.  No acute intrathoracic process, pneumonia or CHF  Original Report Authenticated By: Judie Petit. Ruel Favors, M.D.   Dg Chest Port 1 View 04/29/2011  *RADIOLOGY REPORT*  Clinical Data: CHF, shortness of breath, hypertension  PORTABLE CHEST - 1 VIEW  Comparison: 05/28/2007  Findings: Normal heart size and vascularity.  Monitor leads and external tubing overlie the chest.  No focal airspace disease, collapse, consolidation, large effusion or pneumothorax.  Trachea midline.  Degenerative thoracic spine.   Prominent lower lobe vascular markings.  Lungs are mildly hyperinflated.  IMPRESSION: Mild hyperinflation.  No acute chest process.  Original Report Authenticated By: Judie Petit. Ruel Favors, M.D.       [facility] [address] [place], [place] 04/30/11 [phone]

## 2011-04-29 NOTE — ED Notes (Signed)
Patient transported to CT 

## 2011-04-30 ENCOUNTER — Encounter (HOSPITAL_COMMUNITY): Payer: Self-pay

## 2011-04-30 LAB — CBC
HCT: 47.4 % (ref 39.0–52.0)
Hemoglobin: 16.1 g/dL (ref 13.0–17.0)
MCH: 30 pg (ref 26.0–34.0)
MCHC: 34 g/dL (ref 30.0–36.0)
MCV: 88.3 fL (ref 78.0–100.0)
Platelets: 117 10*3/uL — ABNORMAL LOW (ref 150–400)
RBC: 5.37 MIL/uL (ref 4.22–5.81)
RDW: 14.2 % (ref 11.5–15.5)
WBC: 5.6 10*3/uL (ref 4.0–10.5)

## 2011-04-30 LAB — BASIC METABOLIC PANEL WITH GFR
BUN: 15 mg/dL (ref 6–23)
CO2: 28 meq/L (ref 19–32)
Calcium: 8.4 mg/dL (ref 8.4–10.5)
Chloride: 101 meq/L (ref 96–112)
Creatinine, Ser: 0.9 mg/dL (ref 0.50–1.35)
GFR calc Af Amer: >90 mL/min
GFR calc non Af Amer: 84 mL/min — ABNORMAL LOW
Glucose, Bld: 165 mg/dL — ABNORMAL HIGH (ref 70–99)
Potassium: 4.2 meq/L (ref 3.5–5.1)
Sodium: 137 meq/L (ref 135–145)

## 2011-04-30 LAB — CARDIAC PANEL(CRET KIN+CKTOT+MB+TROPI)
CK, MB: 3.2 ng/mL (ref 0.3–4.0)
CK, MB: 3.7 ng/mL (ref 0.3–4.0)
CK, MB: 3.8 ng/mL (ref 0.3–4.0)
Relative Index: 1.4 (ref 0.0–2.5)
Relative Index: 1.9 (ref 0.0–2.5)
Relative Index: 1.7 (ref 0.0–2.5)
Total CK: 229 U/L (ref 7–232)
Total CK: 200 U/L (ref 7–232)
Total CK: 219 U/L (ref 7–232)
Troponin I: <0.3 ng/mL
Troponin I: <0.3 ng/mL
Troponin I: <0.3 ng/mL

## 2011-04-30 MED ORDER — ENOXAPARIN SODIUM 40 MG/0.4ML ~~LOC~~ SOLN
40.0000 mg | Freq: Every day | SUBCUTANEOUS | Status: DC
  Administered 2011-04-30 – 2011-05-05 (×7): 40 mg via SUBCUTANEOUS
  Filled 2011-04-30 (×9): qty 0.4

## 2011-04-30 MED ORDER — LEVOFLOXACIN IN D5W 750 MG/150ML IV SOLN
750.0000 mg | Freq: Once | INTRAVENOUS | Status: AC
  Administered 2011-04-30: 750 mg via INTRAVENOUS
  Filled 2011-04-30: qty 150

## 2011-04-30 MED ORDER — DM-GUAIFENESIN ER 30-600 MG PO TB12
1.0000 | ORAL_TABLET | Freq: Two times a day (BID) | ORAL | Status: DC
  Administered 2011-04-30 – 2011-05-03 (×7): 1 via ORAL
  Filled 2011-04-30 (×10): qty 1

## 2011-04-30 MED ORDER — ALPRAZOLAM 0.25 MG PO TABS
0.2500 mg | ORAL_TABLET | Freq: Two times a day (BID) | ORAL | Status: DC | PRN
  Administered 2011-04-30 – 2011-05-03 (×5): 0.25 mg via ORAL
  Filled 2011-04-30 (×5): qty 1

## 2011-04-30 MED ORDER — NICOTINE 7 MG/24HR TD PT24
7.0000 mg | MEDICATED_PATCH | Freq: Every day | TRANSDERMAL | Status: DC
  Administered 2011-04-30 – 2011-05-10 (×11): 7 mg via TRANSDERMAL
  Filled 2011-04-30 (×12): qty 1

## 2011-04-30 MED ORDER — BENZONATATE 100 MG PO CAPS
100.0000 mg | ORAL_CAPSULE | Freq: Three times a day (TID) | ORAL | Status: DC | PRN
  Filled 2011-04-30: qty 1

## 2011-04-30 MED ORDER — ONDANSETRON HCL 4 MG/2ML IJ SOLN
4.0000 mg | Freq: Four times a day (QID) | INTRAMUSCULAR | Status: DC | PRN
  Administered 2011-05-02 (×2): 4 mg via INTRAVENOUS
  Filled 2011-04-30 (×3): qty 2

## 2011-04-30 MED ORDER — SODIUM CHLORIDE 0.9 % IJ SOLN
3.0000 mL | INTRAMUSCULAR | Status: DC | PRN
  Administered 2011-05-09 – 2011-05-10 (×2): 3 mL via INTRAVENOUS

## 2011-04-30 MED ORDER — LEVOFLOXACIN IN D5W 750 MG/150ML IV SOLN
750.0000 mg | INTRAVENOUS | Status: DC
  Administered 2011-05-01 – 2011-05-03 (×3): 750 mg via INTRAVENOUS
  Filled 2011-04-30 (×4): qty 150

## 2011-04-30 MED ORDER — AZITHROMYCIN 250 MG PO TABS: 500.0000 mg | ORAL_TABLET | Freq: Every day | ORAL | Status: DC

## 2011-04-30 MED ORDER — SODIUM CHLORIDE 0.9 % IJ SOLN
3.0000 mL | Freq: Two times a day (BID) | INTRAMUSCULAR | Status: DC
  Administered 2011-04-30 – 2011-05-04 (×5): 3 mL via INTRAVENOUS
  Administered 2011-05-05: 21:00:00 via INTRAVENOUS
  Administered 2011-05-05 – 2011-05-10 (×10): 3 mL via INTRAVENOUS

## 2011-04-30 MED ORDER — TRAMADOL HCL 50 MG PO TABS
100.0000 mg | ORAL_TABLET | Freq: Four times a day (QID) | ORAL | Status: DC | PRN
  Administered 2011-04-30 – 2011-05-07 (×9): 100 mg via ORAL
  Filled 2011-04-30 (×5): qty 2
  Filled 2011-04-30 (×2): qty 1
  Filled 2011-04-30 (×3): qty 2

## 2011-04-30 MED ORDER — NITROGLYCERIN 0.4 MG SL SUBL: 0.4000 mg | SUBLINGUAL_TABLET | SUBLINGUAL | Status: DC | PRN

## 2011-04-30 MED ORDER — SODIUM CHLORIDE 0.9 % IV SOLN: 250.0000 mL | INTRAVENOUS | Status: DC | PRN

## 2011-04-30 MED ORDER — AZITHROMYCIN 250 MG PO TABS: 250.0000 mg | ORAL_TABLET | Freq: Every day | ORAL | Status: DC

## 2011-04-30 MED ORDER — ACETAMINOPHEN 325 MG PO TABS
650.0000 mg | ORAL_TABLET | ORAL | Status: DC | PRN
  Administered 2011-04-30 – 2011-05-10 (×9): 650 mg via ORAL
  Filled 2011-04-30 (×11): qty 2

## 2011-04-30 MED ORDER — PREDNISONE 50 MG PO TABS
50.0000 mg | ORAL_TABLET | Freq: Every day | ORAL | Status: DC
  Administered 2011-04-30: 50 mg via ORAL
  Filled 2011-04-30: qty 1

## 2011-04-30 MED ORDER — METHYLPREDNISOLONE SODIUM SUCC 125 MG IJ SOLR
80.0000 mg | Freq: Three times a day (TID) | INTRAMUSCULAR | Status: DC
  Administered 2011-04-30 – 2011-05-02 (×8): 80 mg via INTRAVENOUS
  Filled 2011-04-30 (×13): qty 2

## 2011-04-30 MED ORDER — IPRATROPIUM BROMIDE 0.02 % IN SOLN
0.5000 mg | RESPIRATORY_TRACT | Status: DC
  Administered 2011-04-30 – 2011-05-04 (×24): 0.5 mg via RESPIRATORY_TRACT
  Filled 2011-04-30 (×27): qty 2.5

## 2011-04-30 MED ORDER — ALBUTEROL SULFATE (5 MG/ML) 0.5% IN NEBU
2.5000 mg | INHALATION_SOLUTION | RESPIRATORY_TRACT | Status: DC
  Administered 2011-04-30 – 2011-05-04 (×24): 2.5 mg via RESPIRATORY_TRACT
  Filled 2011-04-30 (×6): qty 0.5
  Filled 2011-04-30: qty 1
  Filled 2011-04-30 (×19): qty 0.5

## 2011-04-30 NOTE — Progress Notes (Signed)
Confirms no pcp. Has [employer] coverage. Able to get one using [facility] customer service number

## 2011-04-30 NOTE — ED Notes (Signed)
Pt reports he is here for sob, pt sts he is still out of breath at this moment, sat 97% on 4 L oxygen, denied using oxygen at home. Pt reports he still has the chest pain at this moment. Breath sound clear, but diminished, HR 75 regular sinus. Pt IV saline lock at this moment.

## 2011-04-30 NOTE — Progress Notes (Signed)
PATIENT DETAILS Name: Mitchell [name] Age: 71 y.o. Sex: male Date of Birth: 03-Mar-1941 Admit Date: 04/29/2011 PCP:No primary provider on file.  Subjective: Still short of breath-almost unchanged from yesterday. Easy speaking in full sentences.  Objective: Vital signs in last 24 hours: Filed Vitals:   04/29/11 1624 04/29/11 1639 04/30/11 0153 04/30/11 0730  BP: 142/85  135/80 169/65  Pulse: 94  90 78  Temp:   99 F (37.2 C) 97.7 F (36.5 C)  TempSrc:   Oral Oral  Resp: 16  18   SpO2: 100% 100% 98% 98%    Weight change:   There is no height or weight on file to calculate BMI.  Intake/Output from previous day: No intake or output data in the 24 hours ending 04/30/11 1055  PHYSICAL EXAM: Gen Exam: Awake and alert with clear speech.  Not in acute distress, speaking in full sentences. Neck: Supple, No JVD.   Chest: Good air entry bilaterally, rhonchi heard all over CVS: S1 S2 Regular, no murmurs.  Abdomen: soft, BS +, non tender, non distended.  Extremities: no edema, lower extremities warm to touch. Neurologic: Non Focal.   Skin: No Rash.   Wounds: N/A.    CONSULTS:  none  LAB RESULTS: CBC  Lab 04/30/11 0800 04/29/11 1634  WBC 5.6 7.0  HGB 16.1 16.4  HCT 47.4 49.1  PLT 117* 121*  MCV 88.3 88.9  MCH 30.0 29.7  MCHC 34.0 33.4  RDW 14.2 14.3  LYMPHSABS -- 0.5*  MONOABS -- 0.8  EOSABS -- 0.0  BASOSABS -- 0.0  BANDABS -- --    Chemistries   Lab 04/30/11 0800 04/29/11 1634  NA 137 136  K 4.2 3.9  CL 101 99  CO2 28 28  GLUCOSE 165* 94  BUN 15 16  CREATININE 0.90 1.00  CALCIUM 8.4 8.8  MG -- --    GFR CrCl is unknown because there is no height on file for the current visit.  Coagulation profile No results found for this basename: INR:5,PROTIME:5 in the last 168 hours  Cardiac Enzymes  Lab 04/30/11 0800 04/30/11 0040  CKMB 3.7 3.2  TROPONINI <0.30 <0.30  MYOGLOBIN -- --    No components found with this basename: POCBNP:3 No results found  for this basename: DDIMER:2 in the last 72 hours No results found for this basename: HGBA1C:2 in the last 72 hours No results found for this basename: CHOL:2,HDL:2,LDLCALC:2,TRIG:2,CHOLHDL:2,LDLDIRECT:2 in the last 72 hours No results found for this basename: TSH,T4TOTAL,FREET3,T3FREE,THYROIDAB in the last 72 hours No results found for this basename: VITAMINB12:2,FOLATE:2,FERRITIN:2,TIBC:2,IRON:2,RETICCTPCT:2 in the last 72 hours No results found for this basename: LIPASE:2,AMYLASE:2 in the last 72 hours  Urine Studies No results found for this basename: UACOL:2,UAPR:2,USPG:2,UPH:2,UTP:2,UGL:2,UKET:2,UBIL:2,UHGB:2,UNIT:2,UROB:2,ULEU:2,UEPI:2,UWBC:2,URBC:2,UBAC:2,CAST:2,CRYS:2,UCOM:2,BILUA:2 in the last 72 hours  MICROBIOLOGY: No results found for this or any previous visit (from the past 240 hour(s)).  RADIOLOGY STUDIES/RESULTS: Ct Angio Chest W/cm &/or Wo Cm  04/29/2011  *RADIOLOGY REPORT*  Clinical Data: Shortness of breath, cough, chest pain  CT ANGIOGRAPHY CHEST  Technique:  Multidetector CT imaging of the chest using the standard protocol during bolus administration of intravenous contrast. Multiplanar reconstructed images including MIPs were obtained and reviewed to evaluate the vascular anatomy.  Contrast: OMNIPAQUE IOHEXOL 300 MG/ML IV SOLN  Comparison: 04/29/2011 chest x-ray  Findings: Limited contrast opacification of the pulmonary arteries. Within the limits of the study, there is no large central or proximal hilar pulmonary embolus demonstrated.  Difficult to exclude smaller segmental or subsegmental pulmonary emboli.  Mild atherosclerosis of the thoracic aorta.  Negative for aneurysm or dissection.  Normal heart size.  No pericardial or pleural effusion.  No hiatal hernia.  Mildly prominent subcarinal lymph nodes, likely reactive.  No definite adenopathy.  Retained secretions present in the lower trachea, images 32-39.  No acute finding in the upper abdomen imaged.  Thoracic  spondylosis and degenerative changes noted.  Lung windows demonstrate mild hyperinflation.  Motion artifact in the lower lobes.  No focal pneumonia, collapse, consolidation, airspace process, pneumonia, or interstitial changes.  IMPRESSION: Limited exam but no large central or proximal hilar pulmonary embolus.  Lower trachea retained secretions.  No acute intrathoracic process, pneumonia or CHF  Original Report Authenticated By: Judie Petit. Ruel Favors, M.D.   Dg Chest Port 1 View  04/29/2011  *RADIOLOGY REPORT*  Clinical Data: CHF, shortness of breath, hypertension  PORTABLE CHEST - 1 VIEW  Comparison: 05/28/2007  Findings: Normal heart size and vascularity.  Monitor leads and external tubing overlie the chest.  No focal airspace disease, collapse, consolidation, large effusion or pneumothorax.  Trachea midline.  Degenerative thoracic spine.   Prominent lower lobe vascular markings.  Lungs are mildly hyperinflated.  IMPRESSION: Mild hyperinflation.  No acute chest process.  Original Report Authenticated By: Judie Petit. Ruel Favors, M.D.    MEDICATIONS: Scheduled Meds:   . acetaminophen  1,000 mg Oral Once  . albuterol  10 mg Nebulization Once  . albuterol  2.5 mg Nebulization Q4H  . albuterol  5 mg Nebulization Once  . aspirin  324 mg Oral Once  . aspirin EC  81-162 mg Oral Daily  . atenolol  25 mg Oral Daily  . dextromethorphan-guaiFENesin  1 tablet Oral BID  . enoxaparin  40 mg Subcutaneous QHS  . finasteride  5 mg Oral Daily  . ipratropium  0.5 mg Nebulization Once  . ipratropium  0.5 mg Nebulization Q4H  . levofloxacin (LEVAQUIN) IV  750 mg Intravenous Once  . levofloxacin (LEVAQUIN) IV  750 mg Intravenous Q24H  . methylPREDNISolone (SOLU-MEDROL) injection  125 mg Intravenous Once  . methylPREDNISolone (SOLU-MEDROL) injection  80 mg Intravenous TID  . nicotine  7 mg Transdermal Daily  . sodium chloride  3 mL Intravenous Q12H  . DISCONTD: albuterol  2.5 mg Nebulization Once  . DISCONTD: azithromycin   250 mg Oral Daily  . DISCONTD: azithromycin  500 mg Oral Daily  . DISCONTD: ipratropium  1 mg Nebulization Once  . DISCONTD: predniSONE  50 mg Oral Q breakfast   Continuous Infusions:   . sodium chloride 50 mL/hr at 04/29/11 1837   PRN Meds:.sodium chloride, acetaminophen, ALPRAZolam, benzonatate, iohexol, nitroGLYCERIN, ondansetron (ZOFRAN) IV, sodium chloride  Antibiotics: Anti-infectives     Start     Dose/Rate Route Frequency Ordered Stop   05/01/11 1000   azithromycin (ZITHROMAX) tablet 250 mg  Status:  Discontinued        250 mg Oral Daily 04/30/11 0016 04/30/11 0939   05/01/11 1000   Levofloxacin (LEVAQUIN) IVPB 750 mg        750 mg 100 mL/hr over 90 Minutes Intravenous Every 24 hours 04/30/11 1018     04/30/11 1000   azithromycin (ZITHROMAX) tablet 500 mg  Status:  Discontinued        500 mg Oral Daily 04/30/11 0016 04/30/11 0939   04/30/11 0945   Levofloxacin (LEVAQUIN) IVPB 750 mg        750 mg 100 mL/hr over 90 Minutes Intravenous  Once 04/30/11 0944  Assessment/Plan: Patient Active Hospital Problem List: Shortness of breath    Assessment: Likely this is either acute bronchitis or undiagnosed COPD with exacerbation. He does not sound wet-doubt this is CHF, his BNP is only 257. 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   Plan: At this time, will plan to treat with antibiotics (change Zithromax to Levaquin), IV steroids (change prednisone to Solu-Medrol) and scheduled nebulizers (change as needed nebulizers to scheduled). Will get a 2-D echo to evaluate LV function as well. I do think he has some amount of obstructive sleep apnea, would need a sleep study as an outpatient. If he does not improve, then perhaps we could think of doing inpatient pulmonary function test, otherwise will plan on doing it as an outpatient. We'll get pulmonary consultation -if he fails to respond to the  above treatment plan.   Hypertension    Assessment: Relatively well controlled    Plan: Continue with the atenolol, if shortness of breath not better, then we need to change this to something even more selective.  History of coronary artery disease Assessment: Doubt above symptomatology is related to CAD, patient is wheezing/has rhonchi, cardiac enzymes so far are negative. Plan: Continue with aspirin and atenolol. Will check lipids to see if he should be on statins.  BPH Assessment: Stable, no problems with urinating. Plan: Continue with Proscar  Disposition: -Remain inpatient  DVT Prophylaxis: Subcutaneous Lovenox at prophylactic dose  Code Status: Full code  Werner Lean Zahli Vetsch,MD. 04/30/2011, 10:55 AM

## 2011-04-30 NOTE — Progress Notes (Signed)
Prefers Primary Care Physician: Verdis Prime MD (Cardiologist). ED CM also offered pt a list of In network [facility] Medicare providers within 15 minutes of his zip [phone]

## 2011-04-30 NOTE — Progress Notes (Signed)
ANTIBIOTIC CONSULT NOTE - INITIAL  Pharmacy Consult for Levaquin Indication: r/o PNA  No Known Allergies  Patient Measurements:     Vital Signs: Temp: 97.7 F (36.5 C) (01/14 0730) Temp src: Oral (01/14 0730) BP: 169/65 mmHg (01/14 0730) Pulse Rate: 78  (01/14 0730) Intake/Output from previous day:   Intake/Output from this shift:    Labs:  Basename 04/30/11 0800 04/29/11 1634  WBC 5.6 7.0  HGB 16.1 16.4  PLT 117* 121*  LABCREA -- --  CREATININE 0.90 1.00   CrCl is unknown because there is no height on file for the current visit. No results found for this basename: VANCOTROUGH:2,VANCOPEAK:2,VANCORANDOM:2,GENTTROUGH:2,GENTPEAK:2,GENTRANDOM:2,TOBRATROUGH:2,TOBRAPEAK:2,TOBRARND:2,AMIKACINPEAK:2,AMIKACINTROU:2,AMIKACIN:2, in the last 72 hours   Microbiology: No results found for this or any previous visit (from the past 720 hour(s)).  Medical History: Past Medical History  Diagnosis Date  . History of PTCA   . History of cardiac cath   . Hypertension   . Hyperlipidemia   . Depression   . BPH (benign prostatic hypertrophy)   . Shortness of breath   . Anxiety   . GERD (gastroesophageal reflux disease)     Medications:  Scheduled:    . acetaminophen  1,000 mg Oral Once  . albuterol  10 mg Nebulization Once  . albuterol  2.5 mg Nebulization Q4H  . albuterol  5 mg Nebulization Once  . aspirin  324 mg Oral Once  . aspirin EC  81-162 mg Oral Daily  . atenolol  25 mg Oral Daily  . dextromethorphan-guaiFENesin  1 tablet Oral BID  . enoxaparin  40 mg Subcutaneous QHS  . finasteride  5 mg Oral Daily  . ipratropium  0.5 mg Nebulization Once  . ipratropium  0.5 mg Nebulization Q4H  . levofloxacin (LEVAQUIN) IV  750 mg Intravenous Once  . methylPREDNISolone (SOLU-MEDROL) injection  125 mg Intravenous Once  . methylPREDNISolone (SOLU-MEDROL) injection  80 mg Intravenous TID  . nicotine  7 mg Transdermal Daily  . sodium chloride  3 mL Intravenous Q12H  . DISCONTD:  albuterol  2.5 mg Nebulization Once  . DISCONTD: azithromycin  250 mg Oral Daily  . DISCONTD: azithromycin  500 mg Oral Daily  . DISCONTD: ipratropium  1 mg Nebulization Once  . DISCONTD: predniSONE  50 mg Oral Q breakfast   Infusions:    . sodium chloride 50 mL/hr at 04/29/11 1837   PRN: sodium chloride, acetaminophen, ALPRAZolam, benzonatate, iohexol, nitroGLYCERIN, ondansetron (ZOFRAN) IV, sodium chloride Assessment: 71 yo M with SOB to start levaquin emperically for r/o PNA WBC WNL Renal fnx stable with Scr 0.9 Afebrile   Goal of Therapy: Levaquin per renal fnx  Plan:  1.) Levaquin 750 mg IV q24h 2.) Monitor renal fnx   Allante Whitmire, Loma Messing 04/30/2011,10:12 AM

## 2011-04-30 NOTE — Progress Notes (Signed)
  Echocardiogram 2D Echocardiogram has been performed.  [name] Deneen 04/30/2011, 10:31 AM

## 2011-04-30 NOTE — ED Notes (Signed)
Pt moved to hospital bed for comfort,

## 2011-04-30 NOTE — ED Notes (Signed)
Vital signs deferred until morning rounds, due to pt request and stability of vital signs previously

## 2011-05-01 DIAGNOSIS — J101 Influenza due to other identified influenza virus with other respiratory manifestations: Secondary | ICD-10-CM | POA: Diagnosis present

## 2011-05-01 DIAGNOSIS — R0902 Hypoxemia: Secondary | ICD-10-CM | POA: Diagnosis present

## 2011-05-01 LAB — CBC
HCT: 48.9 % (ref 39.0–52.0)
Hemoglobin: 16.5 g/dL (ref 13.0–17.0)
RBC: 5.5 MIL/uL (ref 4.22–5.81)
WBC: 8.7 10*3/uL (ref 4.0–10.5)

## 2011-05-01 LAB — BASIC METABOLIC PANEL
BUN: 20 mg/dL (ref 6–23)
CO2: 26 mEq/L (ref 19–32)
Chloride: 100 mEq/L (ref 96–112)
Glucose, Bld: 205 mg/dL — ABNORMAL HIGH (ref 70–99)
Potassium: 3.4 mEq/L — ABNORMAL LOW (ref 3.5–5.1)
Sodium: 137 mEq/L (ref 135–145)

## 2011-05-01 LAB — LIPID PANEL
Cholesterol: 187 mg/dL (ref 0–200)
HDL: 59 mg/dL (ref >39)
LDL Cholesterol: 113 mg/dL — ABNORMAL HIGH (ref 0–99)
Triglycerides: 76 mg/dL (ref <150)
VLDL: 15 mg/dL (ref 0–40)

## 2011-05-01 LAB — INFLUENZA PANEL BY PCR (TYPE A & B): H1N1 flu by pcr: NOT DETECTED

## 2011-05-01 MED ORDER — ALBUTEROL SULFATE (5 MG/ML) 0.5% IN NEBU
INHALATION_SOLUTION | RESPIRATORY_TRACT | Status: AC
  Administered 2011-05-01: 2.5 mg via RESPIRATORY_TRACT
  Filled 2011-05-01: qty 0.5

## 2011-05-01 NOTE — Progress Notes (Signed)
Subjective: Pt states that he doesn't feel any better today. Patient appears non-toxic but acutely ill appearing.Symptoms began 4 days ago. Objective: Filed Vitals:   05/01/11 1441 05/01/11 1541 05/01/11 2009 05/01/11 2120  BP: 148/79   152/88  Pulse: 74   78  Temp: 98.2 F (36.8 C)   97.7 F (36.5 C)  TempSrc: Oral   Oral  Resp: 18   18  Height:      Weight:      SpO2: 99% 96% 96% 96%   Weight change:   Intake/Output Summary (Last 24 hours) at 05/01/11 2125 Last data filed at 05/01/11 1800  Gross per 24 hour  Intake   1034 ml  Output    600 ml  Net    434 ml    General: Alert, awake, oriented x3, in no acute distress.  HEENT: [place]/AT [place]ER[place], EOMI Neck: Trachea midline,  no masses, no thyromegal,y no JVD, no carotid bruit OROPHARYNX:  Moist, No exudate/ erythema/lesions.  Heart: Regular rate and rhythm, without murmurs, rubs, gallops, PMI non-displaced, no heaves or thrills on palpation.  Lungs: Clear to auscultation, no wheezing or rhonchi noted. No increased vocal fremitus resonant to percussion  Abdomen: Soft, nontender, nondistended, positive bowel sounds, no masses no hepatosplenomegaly noted..  Neuro: No focal neurological deficits noted cranial nerves II through XII grossly intact. DTRs 2+ bilaterally upper and lower extremities. Strength functional in bilateral upper and lower extremities. Musculoskeletal: No warm swelling or erythema around joints, no spinal tenderness noted. Psychiatric: Patient alert and oriented x3, good insight and cognition, good recent to remote recall. Lymph node survey: No cervical axillary or inguinal lymphadenopathy noted.     Lab Results:  Basename 05/01/11 0430 04/30/11 0800  NA 137 137  K 3.4* 4.2  CL 100 101  CO2 26 28  GLUCOSE 205* 165*  BUN 20 15  CREATININE 1.02 0.90  CALCIUM 8.8 8.4  MG -- --  PHOS -- --   No results found for this basename: AST:2,ALT:2,ALKPHOS:2,BILITOT:2,PROT:2,ALBUMIN:2 in the last 72 hours No results  found for this basename: LIPASE:2,AMYLASE:2 in the last 72 hours  Basename 05/01/11 0430 04/30/11 0800 04/29/11 1634  WBC 8.7 5.6 --  NEUTROABS -- -- 5.6  HGB 16.5 16.1 --  HCT 48.9 47.4 --  MCV 88.9 88.3 --  PLT 120* 117* --    Basename 04/30/11 1155 04/30/11 0800 04/30/11 0040  CKTOTAL 219 200 229  CKMB 3.8 3.7 3.2  CKMBINDEX -- -- --  TROPONINI <0.30 <0.30 <0.30   No components found with this basename: POCBNP:3 No results found for this basename: DDIMER:2 in the last 72 hours No results found for this basename: HGBA1C:2 in the last 72 hours  Basename 05/01/11 0430  CHOL 187  HDL 59  LDLCALC 113*  TRIG 76  CHOLHDL 3.2  LDLDIRECT --   No results found for this basename: TSH,T4TOTAL,FREET3,T3FREE,THYROIDAB in the last 72 hours No results found for this basename: VITAMINB12:2,FOLATE:2,FERRITIN:2,TIBC:2,IRON:2,RETICCTPCT:2 in the last 72 hours  Micro Results: Recent Results (from the past 240 hour(s))  URINE CULTURE     Status: Normal   Collection Time   04/29/11  6:09 PM      Component Value Range Status Comment   Specimen Description URINE, CLEAN CATCH   Final    Special Requests NONE   Final    Setup Time [id number]   Final    Colony Count 5,000 COLONIES/ML   Final    Culture INSIGNIFICANT GROWTH   Final  Report Status 05/01/2011 FINAL   Final     Studies/Results: Ct Angio Chest W/cm &/or Wo Cm  04/29/2011  *RADIOLOGY REPORT*  Clinical Data: Shortness of breath, cough, chest pain  CT ANGIOGRAPHY CHEST  Technique:  Multidetector CT imaging of the chest using the standard protocol during bolus administration of intravenous contrast. Multiplanar reconstructed images including MIPs were obtained and reviewed to evaluate the vascular anatomy.  Contrast: OMNIPAQUE IOHEXOL 300 MG/ML IV SOLN  Comparison: 04/29/2011 chest x-ray  Findings: Limited contrast opacification of the pulmonary arteries. Within the limits of the study, there is no large central or proximal  hilar pulmonary embolus demonstrated.  Difficult to exclude smaller segmental or subsegmental pulmonary emboli.  Mild atherosclerosis of the thoracic aorta.  Negative for aneurysm or dissection.  Normal heart size.  No pericardial or pleural effusion.  No hiatal hernia.  Mildly prominent subcarinal lymph nodes, likely reactive.  No definite adenopathy.  Retained secretions present in the lower trachea, images 32-39.  No acute finding in the upper abdomen imaged.  Thoracic spondylosis and degenerative changes noted.  Lung windows demonstrate mild hyperinflation.  Motion artifact in the lower lobes.  No focal pneumonia, collapse, consolidation, airspace process, pneumonia, or interstitial changes.  IMPRESSION: Limited exam but no large central or proximal hilar pulmonary embolus.  Lower trachea retained secretions.  No acute intrathoracic process, pneumonia or CHF  Original Report Authenticated By: Judie Petit. Ruel Favors, M.D.   Dg Chest Port 1 View  04/29/2011  *RADIOLOGY REPORT*  Clinical Data: CHF, shortness of breath, hypertension  PORTABLE CHEST - 1 VIEW  Comparison: 05/28/2007  Findings: Normal heart size and vascularity.  Monitor leads and external tubing overlie the chest.  No focal airspace disease, collapse, consolidation, large effusion or pneumothorax.  Trachea midline.  Degenerative thoracic spine.   Prominent lower lobe vascular markings.  Lungs are mildly hyperinflated.  IMPRESSION: Mild hyperinflation.  No acute chest process.  Original Report Authenticated By: Judie Petit. Ruel Favors, M.D.    Medications: I have reviewed the patient's current medications. Scheduled Meds:   . albuterol  2.5 mg Nebulization Q4H  . aspirin EC  81-162 mg Oral Daily  . atenolol  25 mg Oral Daily  . dextromethorphan-guaiFENesin  1 tablet Oral BID  . enoxaparin  40 mg Subcutaneous QHS  . finasteride  5 mg Oral Daily  . ipratropium  0.5 mg Nebulization Q4H  . levofloxacin (LEVAQUIN) IV  750 mg Intravenous Q24H  .  methylPREDNISolone (SOLU-MEDROL) injection  80 mg Intravenous TID  . nicotine  7 mg Transdermal Daily  . sodium chloride  3 mL Intravenous Q12H   Continuous Infusions:   . sodium chloride 50 mL/hr at 05/01/11 1955   PRN Meds:.sodium chloride, acetaminophen, ALPRAZolam, benzonatate, nitroGLYCERIN, ondansetron (ZOFRAN) IV, sodium chloride, traMADol Assessment/Plan: Patient Active Hospital Problem List: Shortness of breath (04/29/2011)   Assessment: Pt does not display SOB and speaks in full sentences.    Hypertension (04/29/2011)   Assessment: BP still elevated. Will require further titration of medications.    Influenza A (05/01/2011)   Assessment: Will continue supportive care. Pt out of the window of opportunity for Tamiflu.    Hypoxemia (05/01/2011)   Assessment: Continue supportive care.  DVT prophylaxis with Lovenox.     LOS: 2 days

## 2011-05-01 NOTE — Progress Notes (Signed)
CARE MANAGEMENT NOTE 05/01/2011  Patient:  Mitchell [name], Mitchell [name]   Account Number:  [id number]  Date Initiated:  05/01/2011  Documentation initiated by:  DAVIS,RHONDA  Subjective/Objective Assessment:   pt with failed outpt tx of copd     Action/Plan:   lives at home   Anticipated DC Date:  05/04/2011   Anticipated DC Plan:  HOME/SELF CARE  In-house referral  NA      DC Planning Services  NA      [facility] Choice  NA   Choice offered to / List presented to:  NA   DME arranged  NA      DME agency  NA     HH arranged  NA      HH agency  NA   Status of service:  In process, will continue to follow Medicare Important Message given?  NA - LOS <3 / Initial given by admissions (If response is "NO", the following Medicare IM given date fields will be blank) Date Medicare IM given:   Date Additional Medicare IM given:    Discharge Disposition:    Per UR Regulation:  Reviewed for med. necessity/level of care/duration of stay  Comments:  [id number]/Rhonda Davis,RN,BSN,CCM

## 2011-05-02 LAB — CBC
MCH: 29.2 pg (ref 26.0–34.0)
MCHC: 33 g/dL (ref 30.0–36.0)
RDW: 13.9 % (ref 11.5–15.5)

## 2011-05-02 LAB — BASIC METABOLIC PANEL
CO2: 29 mEq/L (ref 19–32)
Chloride: 99 mEq/L (ref 96–112)
GFR calc Af Amer: >90 mL/min (ref >90)
Potassium: 4.9 mEq/L (ref 3.5–5.1)

## 2011-05-02 MED ORDER — OSELTAMIVIR PHOSPHATE 75 MG PO CAPS
75.0000 mg | ORAL_CAPSULE | Freq: Two times a day (BID) | ORAL | Status: AC
  Administered 2011-05-02 – 2011-05-07 (×10): 75 mg via ORAL
  Filled 2011-05-02 (×10): qty 1

## 2011-05-02 MED ORDER — PANTOPRAZOLE SODIUM 40 MG PO TBEC
40.0000 mg | DELAYED_RELEASE_TABLET | Freq: Every day | ORAL | Status: DC
  Administered 2011-05-02 – 2011-05-09 (×7): 40 mg via ORAL
  Filled 2011-05-02 (×8): qty 1

## 2011-05-02 MED ORDER — MORPHINE SULFATE 2 MG/ML IJ SOLN
1.0000 mg | Freq: Once | INTRAMUSCULAR | Status: AC
  Administered 2011-05-02: 1 mg via INTRAVENOUS

## 2011-05-02 MED ORDER — AMLODIPINE BESYLATE 5 MG PO TABS
5.0000 mg | ORAL_TABLET | Freq: Every day | ORAL | Status: DC
  Administered 2011-05-02 – 2011-05-04 (×3): 5 mg via ORAL
  Filled 2011-05-02 (×3): qty 1

## 2011-05-02 MED ORDER — METHYLPREDNISOLONE SODIUM SUCC 125 MG IJ SOLR
60.0000 mg | Freq: Three times a day (TID) | INTRAMUSCULAR | Status: DC
  Administered 2011-05-02 – 2011-05-03 (×2): 60 mg via INTRAVENOUS
  Filled 2011-05-02 (×5): qty 0.96

## 2011-05-02 MED ORDER — MORPHINE SULFATE 2 MG/ML IJ SOLN
INTRAMUSCULAR | Status: AC
  Filled 2011-05-02: qty 1

## 2011-05-02 NOTE — Progress Notes (Signed)
Mitchell [name] is a pleasant  71 y.o. male patient admitted toxic, complaining of shortness of breath. He was positive for type A influenza. He has remained quite toxic but has had tamiflu due to presenting 4 days or so into symptoms.  SUBJECTIVE Says he barely feels better.   1. Dyspnea     Past Medical History  Diagnosis Date  . History of PTCA   . History of cardiac cath   . Hypertension   . Hyperlipidemia   . Depression   . BPH (benign prostatic hypertrophy)   . Shortness of breath   . Anxiety   . GERD (gastroesophageal reflux disease)    Current Facility-Administered Medications  Medication Dose Route Frequency Provider Last Rate Last Dose  . 0.9 %  sodium chloride infusion   Intravenous Continuous Laray Anger, DO 50 mL/hr at 05/02/11 1839    . 0.9 %  sodium chloride infusion  250 mL Intravenous PRN Suella Grove, MD      . acetaminophen (TYLENOL) tablet 650 mg  650 mg Oral Q4H PRN Suella Grove, MD   650 mg at 05/02/11 0655  . albuterol (PROVENTIL) (5 MG/ML) 0.5% nebulizer solution 2.5 mg  2.5 mg Nebulization Q4H [name], MD   2.5 mg at 05/02/11 1644  . ALPRAZolam [name]) tablet 0.25 mg  0.25 mg Oral BID PRN Suella Grove, MD   0.25 mg at 05/01/11 2003  . aspirin EC tablet 81-162 mg  81-162 mg Oral Daily Suella Grove, MD   162 mg at 05/02/11 4782  . atenolol (TENORMIN) tablet 25 mg  25 mg Oral Daily Suella Grove, MD   25 mg at 05/02/11 9562  . benzonatate (TESSALON) capsule 100 mg  100 mg Oral TID PRN Suella Grove, MD      . dextromethorphan-guaiFENesin [facility] DM) 30-600 MG per 12 hr tablet 1 tablet  1 tablet Oral BID Suella Grove, MD   1 tablet at 05/02/11 [phone]  . enoxaparin (LOVENOX) injection 40 mg  40 mg Subcutaneous QHS Suella Grove, MD   40 mg at 05/01/11 2128  . finasteride (PROSCAR) tablet 5 mg  5 mg Oral Daily Suella Grove, MD   5 mg at 05/02/11 6578  . ipratropium (ATROVENT) nebulizer solution 0.5 mg  0.5 mg Nebulization Q4H [name], MD   0.5 mg at 05/02/11 1645  .  Levofloxacin (LEVAQUIN) IVPB 750 mg  750 mg Intravenous Q24H Loma Messing Borgerding, PHARMD   750 mg at 05/02/11 4696  . methylPREDNISolone sodium succinate (SOLU-MEDROL) 125 MG injection 80 mg  80 mg Intravenous TID [name], MD   80 mg at 05/02/11 1527  . morphine 2 MG/ML injection 1 mg  1 mg Intravenous Once Linken Mcglothen, MD      . nicotine (NICODERM CQ - dosed in mg/24 hr) patch 7 mg  7 mg Transdermal Daily Suella Grove, MD   7 mg at 05/02/11 0924  . nitroGLYCERIN (NITROSTAT) SL tablet 0.4 mg  0.4 mg Sublingual Q5 min PRN Suella Grove, MD      . ondansetron [facility]) injection 4 mg  4 mg Intravenous Q6H PRN Suella Grove, MD   4 mg at 05/02/11 1547  . oseltamivir (TAMIFLU) capsule 75 mg  75 mg Oral BID Rosezella Kronick, MD      . sodium chloride 0.9 % injection 3 mL  3 mL Intravenous Q12H Suella Grove, MD   3 mL at 05/01/11 2129  . sodium chloride 0.9 % injection 3 mL  3 mL  Intravenous PRN Suella Grove, MD      . traMADol Janean Sark) tablet 100 mg  100 mg Oral Q6H PRN [name], MD   100 mg at 05/02/11 1540   No Known Allergies Principal Problem:  *Shortness of breath Active Problems:  Hypertension  Influenza A  Hypoxemia   Vital signs in last 24 hours: Temp:  [97.4 F (36.3 C)-97.7 F (36.5 C)] 97.4 F (36.3 C) (01/16 1430) Pulse Rate:  [67-81] 76  (01/16 1430) Resp:  [18] 18  (01/16 1430) BP: (130-162)/(65-89) 130/65 mmHg (01/16 1430) SpO2:  [96 %-98 %] 98 % (01/16 1645) Weight:  [89.449 kg (197 lb 3.2 oz)] 89.449 kg (197 lb 3.2 oz) (01/16 0452) Weight change: -1.27 kg (-2 lb 12.8 oz) Last BM Date: 04/29/11  Intake/Output from previous day: 01/15 0701 - 01/16 0700 In: 1262 [P.O.:360; I.V.:750; IV Piggyback:152] Out: 860 [Urine:860] Intake/Output this shift:    Lab Results:  Basename 05/02/11 0430 05/01/11 0430  WBC 12.6* 8.7  HGB 17.0 16.5  HCT 51.5 48.9  PLT 129* 120*   BMET  Basename 05/02/11 0430 05/01/11 0430  NA 133* 137  K 4.9 3.4*  CL 99 100  CO2 29 26   GLUCOSE 161* 205*  BUN 20 20  CREATININE 0.86 1.02  CALCIUM 9.1 8.8    Studies/Results: No results found.  Medications: I have reviewed the patient's current medications.   Physical exam GENERAL- alert, sweaty. HEAD- normal atraumatic, no neck masses, normal thyroid, no jvd RESPIRATORY- appears well, vitals normal, no respiratory distress, acyanotic, normal RR, ear and throat exam is normal, neck free of mass or lymphadenopathy, chest clear, no wheezing, crepitations, rhonchi, normal symmetric air entry CVS- regular rate and rhythm, S1, S2 normal, no murmur, click, rub or gallop ABDOMEN- abdomen is soft without significant tenderness, masses, organomegaly or guarding NEURO- Grossly normal EXTREMITIES- extremities normal, atraumatic, no cyanosis or edema  Plan  * Influenza A- I believe he may benefit from tamiflu, given he is still toxic. Continue ivf, and other support measures, including small dose of morphine for pain control.   * Bp fluctuating- hold atenolol in view of persistent respiratory distress. Start norvasc.  *BPH- on finasteride.  * dvt/gi prophylaxis. Condition remains very closely guarded.    Wallie Lagrand 05/02/2011 7:03 PM Pager: [phone].

## 2011-05-03 LAB — CBC
HCT: 50.3 % (ref 39.0–52.0)
Hemoglobin: 16.9 g/dL (ref 13.0–17.0)
MCV: 87.8 fL (ref 78.0–100.0)
WBC: 10.2 10*3/uL (ref 4.0–10.5)

## 2011-05-03 LAB — COMPREHENSIVE METABOLIC PANEL
Alkaline Phosphatase: 55 U/L (ref 39–117)
BUN: 22 mg/dL (ref 6–23)
CO2: 26 mEq/L (ref 19–32)
Chloride: 101 mEq/L (ref 96–112)
Creatinine, Ser: 0.85 mg/dL (ref 0.50–1.35)
GFR calc Af Amer: >90 mL/min (ref >90)
GFR calc non Af Amer: 86 mL/min — ABNORMAL LOW (ref >90)
Glucose, Bld: 172 mg/dL — ABNORMAL HIGH (ref 70–99)
Potassium: 4.1 mEq/L (ref 3.5–5.1)
Total Bilirubin: 0.3 mg/dL (ref 0.3–1.2)

## 2011-05-03 MED ORDER — LACTULOSE 10 GM/15ML PO SOLN
20.0000 g | Freq: Once | ORAL | Status: AC
  Administered 2011-05-03: 20 g via ORAL
  Filled 2011-05-03: qty 30

## 2011-05-03 MED ORDER — FUROSEMIDE 40 MG PO TABS
40.0000 mg | ORAL_TABLET | Freq: Once | ORAL | Status: AC
  Administered 2011-05-03: 40 mg via ORAL
  Filled 2011-05-03: qty 1

## 2011-05-03 MED ORDER — GUAIFENESIN ER 600 MG PO TB12
1200.0000 mg | ORAL_TABLET | Freq: Two times a day (BID) | ORAL | Status: DC
  Administered 2011-05-03 – 2011-05-10 (×14): 1200 mg via ORAL
  Filled 2011-05-03 (×16): qty 2

## 2011-05-03 MED ORDER — DOCUSATE SODIUM 100 MG PO CAPS
100.0000 mg | ORAL_CAPSULE | Freq: Two times a day (BID) | ORAL | Status: DC | PRN
  Administered 2011-05-04 – 2011-05-10 (×2): 100 mg via ORAL
  Filled 2011-05-03: qty 1

## 2011-05-03 MED ORDER — ACETAMINOPHEN-CODEINE #3 300-30 MG PO TABS
1.0000 | ORAL_TABLET | Freq: Once | ORAL | Status: AC
  Administered 2011-05-03: 1 via ORAL
  Filled 2011-05-03: qty 1

## 2011-05-03 MED ORDER — LEVOFLOXACIN 750 MG PO TABS
750.0000 mg | ORAL_TABLET | Freq: Every day | ORAL | Status: AC
  Administered 2011-05-04 – 2011-05-06 (×3): 750 mg via ORAL
  Filled 2011-05-03 (×4): qty 1

## 2011-05-03 MED ORDER — SENNA 8.6 MG PO TABS
1.0000 | ORAL_TABLET | Freq: Every day | ORAL | Status: DC
  Administered 2011-05-03 – 2011-05-04 (×2): 8.6 mg via ORAL
  Filled 2011-05-03: qty 1
  Filled 2011-05-03: qty 2

## 2011-05-03 MED ORDER — ZOLPIDEM TARTRATE 5 MG PO TABS
5.0000 mg | ORAL_TABLET | Freq: Every evening | ORAL | Status: DC | PRN
  Administered 2011-05-04 – 2011-05-09 (×6): 5 mg via ORAL
  Filled 2011-05-03 (×6): qty 1

## 2011-05-03 MED ORDER — PREDNISONE 20 MG PO TABS
40.0000 mg | ORAL_TABLET | Freq: Every day | ORAL | Status: DC
  Administered 2011-05-03 – 2011-05-04 (×2): 40 mg via ORAL
  Filled 2011-05-03 (×2): qty 2

## 2011-05-03 NOTE — Progress Notes (Signed)
[name] says he feels a little better. I discussed with his daughter in law earlier today. He complaints of cough/constipation, fever and abdominal pains.   1. Dyspnea     Past Medical History  Diagnosis Date  . History of PTCA   . History of cardiac cath   . Hypertension   . Hyperlipidemia   . Depression   . BPH (benign prostatic hypertrophy)   . Shortness of breath   . Anxiety   . GERD (gastroesophageal reflux disease)    Current Facility-Administered Medications  Medication Dose Route Frequency Provider Last Rate Last Dose  . 0.9 %  sodium chloride infusion  250 mL Intravenous PRN Suella Grove, MD      . acetaminophen (TYLENOL) tablet 650 mg  650 mg Oral Q4H PRN Suella Grove, MD   650 mg at 05/02/11 0655  . acetaminophen-codeine (TYLENOL #3) 300-30 MG per tablet 1 tablet  1 tablet Oral Once Xavia Kniskern, MD      . albuterol (PROVENTIL) (5 MG/ML) 0.5% nebulizer solution 2.5 mg  2.5 mg Nebulization Q4H [name], MD   2.5 mg at 05/03/11 1150  . ALPRAZolam [name]) tablet 0.25 mg  0.25 mg Oral BID PRN Suella Grove, MD   0.25 mg at 05/03/11 0532  . amLODipine (NORVASC) tablet 5 mg  5 mg Oral Daily Shloime Keilman, MD   5 mg at 05/03/11 0946  . aspirin EC tablet 81-162 mg  81-162 mg Oral Daily Suella Grove, MD   162 mg at 05/03/11 0945  . docusate sodium (COLACE) capsule 100 mg  100 mg Oral BID PRN Rj Pedrosa, MD      . enoxaparin (LOVENOX) injection 40 mg  40 mg Subcutaneous QHS Suella Grove, MD   40 mg at 05/02/11 2150  . finasteride (PROSCAR) tablet 5 mg  5 mg Oral Daily Suella Grove, MD   5 mg at 05/03/11 0947  . furosemide (LASIX) tablet 40 mg  40 mg Oral Once [name], MD   40 mg at 05/03/11 1207  . guaiFENesin (MUCINEX) 12 hr tablet 1,200 mg  1,200 mg Oral BID Sy Saintjean, MD      . ipratropium (ATROVENT) nebulizer solution 0.5 mg  0.5 mg Nebulization Q4H [name], MD   0.5 mg at 05/03/11 1150  . lactulose (CHRONULAC) 10 GM/15ML solution 20 g  20 g Oral Once Shawnte Demarest,  MD   20 g at 05/03/11 1207  . morphine 2 MG/ML injection 1 mg  1 mg Intravenous Once Lynze Reddy, MD   1 mg at 05/02/11 2047  . morphine 2 MG/ML injection           . nicotine (NICODERM CQ - dosed in mg/24 hr) patch 7 mg  7 mg Transdermal Daily Suella Grove, MD   7 mg at 05/03/11 0945  . nitroGLYCERIN (NITROSTAT) SL tablet 0.4 mg  0.4 mg Sublingual Q5 min PRN Suella Grove, MD      . ondansetron [facility]) injection 4 mg  4 mg Intravenous Q6H PRN Suella Grove, MD   4 mg at 05/02/11 1547  . oseltamivir (TAMIFLU) capsule 75 mg  75 mg Oral BID Aseem Sessums, MD   75 mg at 05/03/11 0946  . pantoprazole (PROTONIX) EC tablet 40 mg  40 mg Oral Q1200 Eternity Dexter, MD   40 mg at 05/03/11 1206  . predniSONE (DELTASONE) tablet 40 mg  40 mg Oral Daily Serina Nichter, MD   40 mg at 05/03/11 1207  . senna (SENOKOT)  tablet 8.6 mg  1 tablet Oral Daily Kamdon Reisig, MD   8.6 mg at 05/03/11 1206  . sodium chloride 0.9 % injection 3 mL  3 mL Intravenous Q12H Suella Grove, MD   3 mL at 05/01/11 2129  . sodium chloride 0.9 % injection 3 mL  3 mL Intravenous PRN Suella Grove, MD      . traMADol Janean Sark) tablet 100 mg  100 mg Oral Q6H PRN [name], MD   100 mg at 05/03/11 0531  . zolpidem (AMBIEN) tablet 5 mg  5 mg Oral QHS PRN [name], MD      . DISCONTD: 0.9 %  sodium chloride infusion   Intravenous Continuous Arman Loy, MD 100 mL/hr at 05/03/11 0601    . DISCONTD: atenolol (TENORMIN) tablet 25 mg  25 mg Oral Daily Suella Grove, MD   25 mg at 05/02/11 3086  . DISCONTD: benzonatate (TESSALON) capsule 100 mg  100 mg Oral TID PRN Suella Grove, MD      . DISCONTD: dextromethorphan-guaiFENesin [facility] DM) 30-600 MG per 12 hr tablet 1 tablet  1 tablet Oral BID Suella Grove, MD   1 tablet at 05/03/11 0946  . DISCONTD: Levofloxacin (LEVAQUIN) IVPB 750 mg  750 mg Intravenous Q24H Loma Messing Borgerding, PHARMD   750 mg at 05/03/11 0949  . DISCONTD: methylPREDNISolone sodium succinate (SOLU-MEDROL) 125 MG injection 60 mg  60  mg Intravenous Q8H Georges Victorio, MD   60 mg at 05/03/11 0524  . DISCONTD: methylPREDNISolone sodium succinate (SOLU-MEDROL) 125 MG injection 80 mg  80 mg Intravenous TID [name], MD   80 mg at 05/02/11 1527   No Known Allergies Principal Problem:  *Shortness of breath Active Problems:  Hypertension  Influenza A  Hypoxemia   Vital signs in last 24 hours: Temp:  [97.4 F (36.3 C)-98.1 F (36.7 C)] 98.1 F (36.7 C) (01/17 0943) Pulse Rate:  [76-86] 86  (01/17 0943) Resp:  [18-20] 20  (01/17 0943) BP: (130-170)/(65-90) 165/90 mmHg (01/17 0943) SpO2:  [94 %-98 %] 97 % (01/17 0943) Weight:  [90.493 kg (199 lb 8 oz)] 90.493 kg (199 lb 8 oz) (01/17 0516) Weight change: 1.043 kg (2 lb 4.8 oz) Last BM Date: 04/29/11  Intake/Output from previous day: 01/16 0701 - 01/17 0700 In: 1583.7 [P.O.:480; I.V.:1101.7; IV Piggyback:2] Out: 950 [Urine:950] Intake/Output this shift: Total I/O In: 180 [P.O.:180] Out: -   Lab Results:  Basename 05/03/11 0404 05/02/11 0430  WBC 10.2 12.6*  HGB 16.9 17.0  HCT 50.3 51.5  PLT 121* 129*   BMET  Basename 05/03/11 0404 05/02/11 0430  NA 136 133*  K 4.1 4.9  CL 101 99  CO2 26 29  GLUCOSE 172* 161*  BUN 22 20  CREATININE 0.85 0.86  CALCIUM 8.8 9.1    Studies/Results: No results found.  Medications: I have reviewed the patient's current medications.   Physical exam GENERAL- alert HEAD- normal atraumatic, no neck masses, normal thyroid, no jvd RESPIRATORY- appears well, vitals normal, no respiratory distress, acyanotic, normal RR, ear and throat exam is normal, neck free of mass or lymphadenopathy, chest clear, no wheezing, crepitations, rhonchi, normal symmetric air entry CVS- regular rate and rhythm, S1, S2 normal, no murmur, click, rub or gallop ABDOMEN-obese. +BS. NEURO- Grossly normal EXTREMITIES- extremities normal, atraumatic, no cyanosis or edema  Plan  * Influenza A-Making slow progress. No longer toxic looking.  Trial of tylenol #3. Change levaquin to oral. * Htn-uncontrolled. Monitor off ivf.  *BPH- on  finasteride.  * dvt/gi prophylaxis.  Condition remains closely guarded.      Reatha Sur 05/03/2011 1:47 PM Pager: [phone].

## 2011-05-04 ENCOUNTER — Inpatient Hospital Stay (HOSPITAL_COMMUNITY): Payer: Medicare HMO

## 2011-05-04 LAB — CBC
MCV: 88.1 fL (ref 78.0–100.0)
Platelets: 131 10*3/uL — ABNORMAL LOW (ref 150–400)
RDW: 13.7 % (ref 11.5–15.5)
WBC: 11.6 10*3/uL — ABNORMAL HIGH (ref 4.0–10.5)

## 2011-05-04 LAB — BASIC METABOLIC PANEL
Chloride: 100 mEq/L (ref 96–112)
Creatinine, Ser: 0.95 mg/dL (ref 0.50–1.35)
GFR calc Af Amer: >90 mL/min (ref >90)
Sodium: 138 mEq/L (ref 135–145)

## 2011-05-04 MED ORDER — NYSTATIN 100000 UNIT/ML MT SUSP
10.0000 mL | Freq: Four times a day (QID) | OROMUCOSAL | Status: DC
  Administered 2011-05-04 – 2011-05-10 (×17): 1000000 [IU] via ORAL
  Filled 2011-05-04 (×29): qty 10

## 2011-05-04 MED ORDER — PREDNISONE 20 MG PO TABS
30.0000 mg | ORAL_TABLET | Freq: Every day | ORAL | Status: DC
  Administered 2011-05-05: 30 mg via ORAL
  Filled 2011-05-04: qty 1

## 2011-05-04 MED ORDER — FLEET ENEMA 7-19 GM/118ML RE ENEM
1.0000 | ENEMA | RECTAL | Status: AC
  Administered 2011-05-04: 1 via RECTAL
  Filled 2011-05-04: qty 1

## 2011-05-04 MED ORDER — AMLODIPINE BESYLATE 10 MG PO TABS
10.0000 mg | ORAL_TABLET | Freq: Every day | ORAL | Status: DC
  Administered 2011-05-05 – 2011-05-10 (×6): 10 mg via ORAL
  Filled 2011-05-04 (×6): qty 1

## 2011-05-04 NOTE — Progress Notes (Addendum)
SUBJECTIVE Mitchell [name] complaints of swollen and red tongue, which he said happened earlier today. He believes he could not swallow then. His son is concerned about the bulge in his belly when he lies flat. Also worried about constipation.   1. Dyspnea     Past Medical History  Diagnosis Date  . History of PTCA   . History of cardiac cath   . Hypertension   . Hyperlipidemia   . Depression   . BPH (benign prostatic hypertrophy)   . Shortness of breath   . Anxiety   . GERD (gastroesophageal reflux disease)    Current Facility-Administered Medications  Medication Dose Route Frequency Provider Last Rate Last Dose  . 0.9 %  sodium chloride infusion  250 mL Intravenous PRN Suella Grove, MD      . acetaminophen (TYLENOL) tablet 650 mg  650 mg Oral Q4H PRN Suella Grove, MD   650 mg at 05/02/11 0655  . albuterol (PROVENTIL) (5 MG/ML) 0.5% nebulizer solution 2.5 mg  2.5 mg Nebulization Q4H [name], MD   2.5 mg at 05/04/11 1220  . ALPRAZolam [name]) tablet 0.25 mg  0.25 mg Oral BID PRN Suella Grove, MD   0.25 mg at 05/03/11 2041  . amLODipine (NORVASC) tablet 5 mg  5 mg Oral Daily Ladarrell Cornwall, MD   5 mg at 05/04/11 0928  . aspirin EC tablet 81-162 mg  81-162 mg Oral Daily Suella Grove, MD   162 mg at 05/04/11 0926  . docusate sodium (COLACE) capsule 100 mg  100 mg Oral BID PRN Arielys Wandersee, MD   100 mg at 05/04/11 0929  . enoxaparin (LOVENOX) injection 40 mg  40 mg Subcutaneous QHS Suella Grove, MD   40 mg at 05/03/11 2039  . finasteride (PROSCAR) tablet 5 mg  5 mg Oral Daily Suella Grove, MD   5 mg at 05/04/11 9629  . guaiFENesin (MUCINEX) 12 hr tablet 1,200 mg  1,200 mg Oral BID Shady Padron, MD   1,200 mg at 05/04/11 0926  . ipratropium (ATROVENT) nebulizer solution 0.5 mg  0.5 mg Nebulization Q4H [name], MD   0.5 mg at 05/04/11 1220  . levofloxacin (LEVAQUIN) tablet 750 mg  750 mg Oral Daily Bethann Qualley, MD   750 mg at 05/04/11 0925  . nicotine (NICODERM CQ - dosed in mg/24 hr) patch  7 mg  7 mg Transdermal Daily Suella Grove, MD   7 mg at 05/04/11 0928  . nitroGLYCERIN (NITROSTAT) SL tablet 0.4 mg  0.4 mg Sublingual Q5 min PRN Suella Grove, MD      . nystatin (MYCOSTATIN) 100000 UNIT/ML suspension 1,000,000 Units  10 mL Oral QID Destry Dauber, MD      . ondansetron (ZOFRAN) injection 4 mg  4 mg Intravenous Q6H PRN Suella Grove, MD   4 mg at 05/02/11 1547  . oseltamivir (TAMIFLU) capsule 75 mg  75 mg Oral BID Devereaux Grayson, MD   75 mg at 05/04/11 0926  . pantoprazole (PROTONIX) EC tablet 40 mg  40 mg Oral Q1200 Jahlen Bollman, MD   40 mg at 05/04/11 1234  . predniSONE (DELTASONE) tablet 30 mg  30 mg Oral Daily Ravyn Nikkel, MD      . sodium chloride 0.9 % injection 3 mL  3 mL Intravenous Q12H Suella Grove, MD   3 mL at 05/04/11 0929  . sodium chloride 0.9 % injection 3 mL  3 mL Intravenous PRN Suella Grove, MD      . traMADol Janean Sark) tablet  100 mg  100 mg Oral Q6H PRN [name], MD   100 mg at 05/03/11 2040  . zolpidem (AMBIEN) tablet 5 mg  5 mg Oral QHS PRN Minta Fair, MD      . DISCONTD: predniSONE (DELTASONE) tablet 40 mg  40 mg Oral Daily Delainy Mcelhiney, MD   40 mg at 05/04/11 0926  . DISCONTD: senna (SENOKOT) tablet 8.6 mg  1 tablet Oral Daily Iseah Plouff, MD   8.6 mg at 05/04/11 0940   No Known Allergies Principal Problem:  *Shortness of breath Active Problems:  Hypertension  Influenza A  Hypoxemia   Vital signs in last 24 hours: Temp:  [97.3 F (36.3 C)-97.8 F (36.6 C)] 97.3 F (36.3 C) (01/18 0445) Pulse Rate:  [70-89] 70  (01/18 0445) Resp:  [20] 20  (01/18 0445) BP: (155-168)/(82-89) 155/87 mmHg (01/18 0445) SpO2:  [95 %-98 %] 95 % (01/18 0921) Weight change:  Last BM Date: 04/29/11  Intake/Output from previous day: 01/17 0701 - 01/18 0700 In: 2275 [P.O.:1500; I.V.:475; IV Piggyback:300] Out: 1525 [Urine:1525] Intake/Output this shift: Total I/O In: 240 [P.O.:240] Out: -   Lab Results:  Basename 05/04/11 0400 05/03/11 0404  WBC 11.6* 10.2   HGB 16.2 16.9  HCT 48.2 50.3  PLT 131* 121*   BMET  Basename 05/04/11 0400 05/03/11 0404  NA 138 136  K 4.1 4.1  CL 100 101  CO2 31 26  GLUCOSE 127* 172*  BUN 21 22  CREATININE 0.95 0.85  CALCIUM 8.6 8.8    Studies/Results: No results found.  Medications: I have reviewed the patient's current medications.   Physical exam GENERAL- alert, sitting up. More comfortable than yesterday. HEAD- normal atraumatic, no neck masses, normal thyroid, no jvd. Some oral thrush. Red tongue, but patient just had red jello. Per nursing RESPIRATORY- appears well, vitals normal, no respiratory distress, acyanotic, normal RR, ear and throat exam is normal, neck free of mass or lymphadenopathy, chest clear, no wheezing, crepitations, rhonchi, normal symmetric air entry CVS- regular rate and rhythm, S1, S2 normal, no murmur, click, rub or gallop ABDOMEN-?epigastric hernia. Reducible. NEURO- Grossly normal EXTREMITIES- extremities normal, atraumatic, no cyanosis or edema  Plan  * Influenza A- improving. Continue tamiflu, levaquin. Steroid taper. * Htn-fluctuating. Monitor. *BPH- on finasteride. * Swollen tongue- may have reacted to the new meds started yesterday? Tylenol #3 versus sennakot. D/c tylenol #3, sennakot and watch. Some candida as well. Add nystatin. * dvt/gi prophylaxis.  Condition remains closely guarded.     Paulina Muchmore 05/04/2011 2:31 PM Pager: [phone].   epigastric hernia? Ct abdomen.

## 2011-05-05 LAB — CBC
HCT: 47.8 % (ref 39.0–52.0)
Platelets: 139 10*3/uL — ABNORMAL LOW (ref 150–400)
RBC: 5.46 MIL/uL (ref 4.22–5.81)
RDW: 13.8 % (ref 11.5–15.5)
WBC: 10.2 10*3/uL (ref 4.0–10.5)

## 2011-05-05 LAB — BASIC METABOLIC PANEL
BUN: 22 mg/dL (ref 6–23)
Chloride: 100 mEq/L (ref 96–112)
GFR calc Af Amer: >90 mL/min (ref >90)
Potassium: 3.8 mEq/L (ref 3.5–5.1)

## 2011-05-05 MED ORDER — ALBUTEROL SULFATE (5 MG/ML) 0.5% IN NEBU
2.5000 mg | INHALATION_SOLUTION | Freq: Four times a day (QID) | RESPIRATORY_TRACT | Status: DC
  Administered 2011-05-05 – 2011-05-09 (×18): 2.5 mg via RESPIRATORY_TRACT
  Filled 2011-05-05 (×18): qty 0.5

## 2011-05-05 MED ORDER — LACTULOSE 10 GM/15ML PO SOLN
20.0000 g | Freq: Three times a day (TID) | ORAL | Status: AC
  Administered 2011-05-05 – 2011-05-06 (×3): 20 g via ORAL
  Filled 2011-05-05 (×3): qty 30

## 2011-05-05 MED ORDER — PREDNISONE 20 MG PO TABS
20.0000 mg | ORAL_TABLET | Freq: Every day | ORAL | Status: DC
  Administered 2011-05-06: 20 mg via ORAL
  Filled 2011-05-05 (×2): qty 1

## 2011-05-05 MED ORDER — IPRATROPIUM BROMIDE 0.02 % IN SOLN
0.5000 mg | Freq: Four times a day (QID) | RESPIRATORY_TRACT | Status: DC | PRN
  Administered 2011-05-05: 0.5 mg via RESPIRATORY_TRACT
  Filled 2011-05-05: qty 2.5

## 2011-05-05 MED ORDER — IPRATROPIUM BROMIDE 0.02 % IN SOLN
0.5000 mg | Freq: Four times a day (QID) | RESPIRATORY_TRACT | Status: DC
  Administered 2011-05-05 – 2011-05-09 (×18): 0.5 mg via RESPIRATORY_TRACT
  Filled 2011-05-05 (×19): qty 2.5

## 2011-05-05 MED ORDER — FUROSEMIDE 40 MG PO TABS
40.0000 mg | ORAL_TABLET | Freq: Once | ORAL | Status: AC
  Administered 2011-05-05: 40 mg via ORAL
  Filled 2011-05-05: qty 1

## 2011-05-05 MED ORDER — ALBUTEROL SULFATE (5 MG/ML) 0.5% IN NEBU
2.5000 mg | INHALATION_SOLUTION | Freq: Four times a day (QID) | RESPIRATORY_TRACT | Status: DC | PRN
  Administered 2011-05-05: 2.5 mg via RESPIRATORY_TRACT
  Filled 2011-05-05: qty 0.5

## 2011-05-05 NOTE — Progress Notes (Signed)
Mitchell [name] has not had ct of the abdomen as he says he is full and cannot take contrast as yet. He reports slow progress. He has had small bowel movement. Complaints of bloating and gaining "5 lbs since admission".    1. Dyspnea     Past Medical History  Diagnosis Date  . History of PTCA   . History of cardiac cath   . Hypertension   . Hyperlipidemia   . Depression   . BPH (benign prostatic hypertrophy)   . Shortness of breath   . Anxiety   . GERD (gastroesophageal reflux disease)    Current Facility-Administered Medications  Medication Dose Route Frequency Provider Last Rate Last Dose  . 0.9 %  sodium chloride infusion  250 mL Intravenous PRN Mitchell Grove, MD      . acetaminophen (TYLENOL) tablet 650 mg  650 mg Oral Q4H PRN Mitchell Grove, MD   650 mg at 05/02/11 0655  . albuterol (PROVENTIL) (5 MG/ML) 0.5% nebulizer solution 2.5 mg  2.5 mg Nebulization QID Mitchell Rinella, MD   2.5 mg at 05/05/11 1221  . albuterol (PROVENTIL) (5 MG/ML) 0.5% nebulizer solution 2.5 mg  2.5 mg Nebulization Q6H PRN Mitchell Duffner, MD   2.5 mg at 05/05/11 0549  . ALPRAZolam [name]) tablet 0.25 mg  0.25 mg Oral BID PRN Mitchell Grove, MD   0.25 mg at 05/03/11 2041  . amLODipine (NORVASC) tablet 10 mg  10 mg Oral Daily Mitchell Osmanovic, MD   10 mg at 05/05/11 1008  . aspirin EC tablet 81-162 mg  81-162 mg Oral Daily Mitchell Grove, MD   81 mg at 05/05/11 1008  . docusate sodium (COLACE) capsule 100 mg  100 mg Oral BID PRN Mitchell Renovato, MD   100 mg at 05/04/11 0929  . enoxaparin (LOVENOX) injection 40 mg  40 mg Subcutaneous QHS Mitchell Grove, MD   40 mg at 05/04/11 2154  . finasteride (PROSCAR) tablet 5 mg  5 mg Oral Daily Mitchell Grove, MD   5 mg at 05/05/11 1303  . guaiFENesin (MUCINEX) 12 hr tablet 1,200 mg  1,200 mg Oral BID Mitchell Ortner, MD   1,200 mg at 05/05/11 1302  . ipratropium (ATROVENT) nebulizer solution 0.5 mg  0.5 mg Nebulization QID Mitchell Gemme, MD   0.5 mg at 05/05/11 1221  . ipratropium (ATROVENT) nebulizer solution  0.5 mg  0.5 mg Nebulization Q6H PRN Mitchell Manninen, MD   0.5 mg at 05/05/11 0549  . levofloxacin (LEVAQUIN) tablet 750 mg  750 mg Oral Daily Mitchell Johnstone, MD   750 mg at 05/05/11 1008  . nicotine (NICODERM CQ - dosed in mg/24 hr) patch 7 mg  7 mg Transdermal Daily Mitchell Grove, MD   7 mg at 05/05/11 1009  . nitroGLYCERIN (NITROSTAT) SL tablet 0.4 mg  0.4 mg Sublingual Q5 min PRN Mitchell Grove, MD      . nystatin (MYCOSTATIN) 100000 UNIT/ML suspension 1,000,000 Units  10 mL Oral QID Mitchell Canal, MD   1,000,000 Units at 05/05/11 1304  . ondansetron (ZOFRAN) injection 4 mg  4 mg Intravenous Q6H PRN Mitchell Grove, MD   4 mg at 05/02/11 1547  . oseltamivir (TAMIFLU) capsule 75 mg  75 mg Oral BID Mitchell Boyar, MD   75 mg at 05/05/11 1008  . pantoprazole (PROTONIX) EC tablet 40 mg  40 mg Oral Q1200 Mitchell Netter, MD   40 mg at 05/05/11 1303  . predniSONE (DELTASONE) tablet 30 mg  30 mg Oral Daily Mitchell Binion,  MD   30 mg at 05/05/11 1008  . sodium chloride 0.9 % injection 3 mL  3 mL Intravenous Q12H Mitchell Grove, MD   3 mL at 05/05/11 1304  . sodium chloride 0.9 % injection 3 mL  3 mL Intravenous PRN Mitchell Grove, MD      . sodium phosphate (FLEET) 7-19 GM/118ML enema 1 enema  1 enema Rectal 1 day or 1 dose Mitchell Moraes, MD   1 enema at 05/04/11 1641  . traMADol (ULTRAM) tablet 100 mg  100 mg Oral Q6H PRN Mitchell A. Ashley Royalty, MD   100 mg at 05/04/11 2025  . zolpidem (AMBIEN) tablet 5 mg  5 mg Oral QHS PRN Mitchell Canal, MD   5 mg at 05/04/11 2154  . DISCONTD: albuterol (PROVENTIL) (5 MG/ML) 0.5% nebulizer solution 2.5 mg  2.5 mg Nebulization Q4H Mitchell Massed, MD   2.5 mg at 05/04/11 2037  . DISCONTD: ipratropium (ATROVENT) nebulizer solution 0.5 mg  0.5 mg Nebulization Q4H Mitchell Massed, MD   0.5 mg at 05/04/11 2037   No Known Allergies Principal Problem:  *Shortness of breath Active Problems:  Hypertension  Influenza A  Hypoxemia   Vital signs in last 24 hours: Temp:  [97.6 F (36.4 C)-98 F (36.7  C)] 98 F (36.7 C) (01/19 1430) Pulse Rate:  [82-110] 110  (01/19 1430) Resp:  [16-18] 18  (01/19 1430) BP: (127-166)/(80-89) 127/80 mmHg (01/19 1430) SpO2:  [86 %-96 %] 90 % (01/19 1445) Weight:  [89.994 kg (198 lb 6.4 oz)] 89.994 kg (198 lb 6.4 oz) (01/19 0619) Weight change:  Last BM Date: 04/29/11  Intake/Output from previous day: 01/18 0701 - 01/19 0700 In: 480 [P.O.:480] Out: 500 [Urine:500] Intake/Output this shift: Total I/O In: 420 [P.O.:420] Out: 175 [Urine:175]  Lab Results:  Basename 05/05/11 0419 05/04/11 0400  WBC 10.2 11.6*  HGB 16.2 16.2  HCT 47.8 48.2  PLT 139* 131*   BMET  Basename 05/05/11 0419 05/04/11 0400  NA 137 138  K 3.8 4.1  CL 100 100  CO2 31 31  GLUCOSE 102* 127*  BUN 22 21  CREATININE 0.91 0.95  CALCIUM 8.6 8.6    Studies/Results: No results found.  Medications: I have reviewed the patient's current medications.   Physical exam GENERAL- alert HEAD- normal atraumatic, no neck masses, normal thyroid, no jvd RESPIRATORY- appears well, vitals normal, no respiratory distress, acyanotic, normal RR, ear and throat exam is normal, neck free of mass or lymphadenopathy, chest clear, no wheezing, crepitations, rhonchi, normal symmetric air entry CVS- regular rate and rhythm, S1, S2 normal, no murmur, click, rub or gallop ABDOMEN- epigastric hernia. NEURO- Grossly normal EXTREMITIES- extremities normal, atraumatic, no cyanosis or edema  Plan  * Influenza A- improving. Continue tamiflu, levaquin.  Continue Steroid taper.  * Htn-fluctuating. Monitor. To get lasix dose today. Resume home meds for the long term. *BPH- on finasteride.  * Swollen tongue- resolved. Some candida. Continue nystatin. * Epigastric hernia- await ct abdomen/pelvis when patient ready to do it. * dvt/gi prophylaxis.  Condition fair.      Mitchell [name] 05/05/2011 4:36 PM Pager: [phone].

## 2011-05-05 NOTE — Progress Notes (Signed)
Pt continues to refuse bowel prep. Will continue to monitor and encourage pt the benefits of bowel prep. Mitchell Maser, RN

## 2011-05-06 LAB — COMPREHENSIVE METABOLIC PANEL
ALT: 43 U/L (ref 0–53)
AST: 23 U/L (ref 0–37)
Albumin: 2.7 g/dL — ABNORMAL LOW (ref 3.5–5.2)
Chloride: 95 mEq/L — ABNORMAL LOW (ref 96–112)
Creatinine, Ser: 0.97 mg/dL (ref 0.50–1.35)
Potassium: 3.4 mEq/L — ABNORMAL LOW (ref 3.5–5.1)
Sodium: 137 mEq/L (ref 135–145)
Total Bilirubin: 0.4 mg/dL (ref 0.3–1.2)

## 2011-05-06 LAB — CBC
MCV: 87 fL (ref 78.0–100.0)
Platelets: 151 10*3/uL (ref 150–400)
RBC: 5.45 MIL/uL (ref 4.22–5.81)
RDW: 13.8 % (ref 11.5–15.5)
WBC: 12.6 10*3/uL — ABNORMAL HIGH (ref 4.0–10.5)

## 2011-05-06 MED ORDER — FLEET ENEMA 7-19 GM/118ML RE ENEM
1.0000 | ENEMA | Freq: Once | RECTAL | Status: AC
  Administered 2011-05-06: 1 via RECTAL
  Filled 2011-05-06: qty 1

## 2011-05-06 MED ORDER — IOHEXOL 300 MG/ML  SOLN
100.0000 mL | Freq: Once | INTRAMUSCULAR | Status: AC | PRN
  Administered 2011-05-06: 100 mL via INTRAVENOUS

## 2011-05-06 MED ORDER — PREDNISONE 10 MG PO TABS
10.0000 mg | ORAL_TABLET | Freq: Every day | ORAL | Status: DC
  Administered 2011-05-07: 10 mg via ORAL
  Filled 2011-05-06 (×2): qty 1

## 2011-05-06 MED ORDER — POTASSIUM CHLORIDE CRYS ER 20 MEQ PO TBCR
40.0000 meq | EXTENDED_RELEASE_TABLET | Freq: Once | ORAL | Status: AC
  Administered 2011-05-06: 40 meq via ORAL
  Filled 2011-05-06: qty 2

## 2011-05-06 NOTE — Progress Notes (Signed)
SUBJECTIVE "not feeling too good". Hasn't moved bowel today. I discussed patient's ct abdomen results with DR Daphine Deutscher, gen surgery, who felt that no surgical intervention needed for such kind of intramuscular bleeds as patient has, unless superinfection occurs. i have also taken the patient off lovenox.   1. Dyspnea     Past Medical History  Diagnosis Date  . History of PTCA   . History of cardiac cath   . Hypertension   . Hyperlipidemia   . Depression   . BPH (benign prostatic hypertrophy)   . Shortness of breath   . Anxiety   . GERD (gastroesophageal reflux disease)    Current Facility-Administered Medications  Medication Dose Route Frequency Provider Last Rate Last Dose  . 0.9 %  sodium chloride infusion  250 mL Intravenous PRN Suella Grove, MD      . acetaminophen (TYLENOL) tablet 650 mg  650 mg Oral Q4H PRN Suella Grove, MD   650 mg at 05/06/11 0726  . albuterol (PROVENTIL) (5 MG/ML) 0.5% nebulizer solution 2.5 mg  2.5 mg Nebulization QID Devan Babino, MD   2.5 mg at 05/06/11 1610  . albuterol (PROVENTIL) (5 MG/ML) 0.5% nebulizer solution 2.5 mg  2.5 mg Nebulization Q6H PRN Leanette Eutsler, MD   2.5 mg at 05/05/11 0549  . ALPRAZolam [name]) tablet 0.25 mg  0.25 mg Oral BID PRN Suella Grove, MD   0.25 mg at 05/03/11 2041  . amLODipine (NORVASC) tablet 10 mg  10 mg Oral Daily Draven Laine, MD   10 mg at 05/06/11 1107  . aspirin EC tablet 81-162 mg  81-162 mg Oral Daily Suella Grove, MD   81 mg at 05/06/11 1108  . docusate sodium (COLACE) capsule 100 mg  100 mg Oral BID PRN Sahas Sluka, MD   100 mg at 05/04/11 0929  . finasteride (PROSCAR) tablet 5 mg  5 mg Oral Daily Suella Grove, MD   5 mg at 05/06/11 1107  . furosemide (LASIX) tablet 40 mg  40 mg Oral Once Andretta Ergle, MD   40 mg at 05/05/11 1815  . guaiFENesin (MUCINEX) 12 hr tablet 1,200 mg  1,200 mg Oral BID Marirose Deveney, MD   1,200 mg at 05/06/11 1107  . iohexol (OMNIPAQUE) 300 MG/ML solution 100 mL  100 mL Intravenous Once PRN  Medication Radiologist, MD   100 mL at 05/06/11 1000  . ipratropium (ATROVENT) nebulizer solution 0.5 mg  0.5 mg Nebulization QID Rayel Santizo, MD   0.5 mg at 05/06/11 1610  . ipratropium (ATROVENT) nebulizer solution 0.5 mg  0.5 mg Nebulization Q6H PRN Tillman Kazmierski, MD   0.5 mg at 05/05/11 0549  . lactulose (CHRONULAC) 10 GM/15ML solution 20 g  20 g Oral TID Roselyn Doby, MD   20 g at 05/06/11 1103  . levofloxacin (LEVAQUIN) tablet 750 mg  750 mg Oral Daily Sharleen Szczesny, MD   750 mg at 05/06/11 1106  . nicotine (NICODERM CQ - dosed in mg/24 hr) patch 7 mg  7 mg Transdermal Daily Suella Grove, MD   7 mg at 05/06/11 1114  . nitroGLYCERIN (NITROSTAT) SL tablet 0.4 mg  0.4 mg Sublingual Q5 min PRN Suella Grove, MD      . nystatin (MYCOSTATIN) 100000 UNIT/ML suspension 1,000,000 Units  10 mL Oral QID [name], MD   1,000,000 Units at 05/06/11 1103  . ondansetron (ZOFRAN) injection 4 mg  4 mg Intravenous Q6H PRN Suella Grove, MD   4 mg at 05/02/11 1547  . oseltamivir (TAMIFLU)  capsule 75 mg  75 mg Oral BID Ustin Cruickshank, MD   75 mg at 05/06/11 1107  . pantoprazole (PROTONIX) EC tablet 40 mg  40 mg Oral Q1200 Sarayu Prevost, MD   40 mg at 05/06/11 1107  . potassium chloride SA (K-DUR,KLOR-CON) CR tablet 40 mEq  40 mEq Oral Once Nela Bascom, MD      . predniSONE (DELTASONE) tablet 10 mg  10 mg Oral Daily Aryianna Earwood, MD      . sodium chloride 0.9 % injection 3 mL  3 mL Intravenous Q12H Suella Grove, MD   3 mL at 05/06/11 1105  . sodium chloride 0.9 % injection 3 mL  3 mL Intravenous PRN Suella Grove, MD      . sodium phosphate (FLEET) 7-19 GM/118ML enema 1 enema  1 enema Rectal Once Amorette Charrette, MD      . traMADol (ULTRAM) tablet 100 mg  100 mg Oral Q6H PRN [name], MD   100 mg at 05/06/11 0726  . zolpidem (AMBIEN) tablet 5 mg  5 mg Oral QHS PRN [name], MD   5 mg at 05/05/11 2112  . DISCONTD: enoxaparin (LOVENOX) injection 40 mg  40 mg Subcutaneous QHS Suella Grove, MD   40 mg at 05/05/11  2113  . DISCONTD: predniSONE (DELTASONE) tablet 20 mg  20 mg Oral Daily Enid Maultsby, MD       No Known Allergies Principal Problem:  *Shortness of breath Active Problems:  Hypertension  Influenza A  Hypoxemia   Vital signs in last 24 hours: Temp:  [97.4 F (36.3 C)-98.3 F (36.8 C)] 97.4 F (36.3 C) (01/20 1530) Pulse Rate:  [82-101] 83  (01/20 1530) Resp:  [18-19] 19  (01/20 1530) BP: (127-157)/(87-95) 157/95 mmHg (01/20 1530) SpO2:  [90 %-95 %] 90 % (01/20 1611) Weight:  [88.542 kg (195 lb 3.2 oz)] 88.542 kg (195 lb 3.2 oz) (01/20 0321) Weight change: -1.86 kg (-4 lb 1.6 oz) Last BM Date: 04/29/11  Intake/Output from previous day: 01/19 0701 - 01/20 0700 In: 570 [P.O.:570] Out: 1600 [Urine:1600] Intake/Output this shift: Total I/O In: 0  Out: 200 [Urine:200]  Lab Results:  [facility] 05/06/11 0432 05/05/11 0419  WBC 12.6* 10.2  HGB 15.9 16.2  HCT 47.4 47.8  PLT 151 139*   BMET  Basename 05/06/11 0432 05/05/11 0419  NA 137 137  K 3.4* 3.8  CL 95* 100  CO2 35* 31  GLUCOSE 96 102*  BUN 17 22  CREATININE 0.97 0.91  CALCIUM 8.6 8.6    Studies/Results: Ct Abdomen Pelvis W Contrast  05/06/2011  *RADIOLOGY REPORT*  Clinical Data: Suspected epigastric hernia  CT ABDOMEN AND PELVIS WITH CONTRAST  Technique:  Multidetector CT imaging of the abdomen and pelvis was performed following the standard protocol during bolus administration of intravenous contrast.  Contrast: OMNIPAQUE IOHEXOL 300 MG/ML IV SOLN  Comparison: 10/19/2009  Findings: Dependent atelectasis at the lung bases.  Liver, spleen, pancreas, and adrenal glands are within normal limits.  Cholelithiasis.  No associated inflammatory changes.  Kidneys are notable for a tiny left renal cysts.  No hydronephrosis.  No evidence of bowel obstruction.  Normal appendix.  Colonic diverticulosis, without associated inflammatory changes.  Atherosclerotic calcifications of the abdominal aorta and branch vessels.  No  abdominopelvic ascites.  No suspicious abdominopelvic lymphadenopathy.  Prostatomegaly, measuring 5.7 cm in transverse dimension.  Bladder is within normal limits.  No evidence of ventral hernia.  Tiny fat-containing left femoral and inguinal hernias (series  2/images 78 and 82).  Asymmetric enlargement of the right rectus abdominus muscle with suspected 2.5 x 3.3 cm focus of hemorrhage inferiorly (series 2/image 57).  Degenerative changes of the visualized thoracolumbar spine.  IMPRESSION: No evidence of ventral hernia.  Asymmetric enlargement of the right rectus abdominus muscle with suspected intramuscular hematoma.  Additional ancillary findings as above.  Original Report Authenticated By: Charline Bills, M.D.    Medications: I have reviewed the patient's current medications.   Physical exam GENERAL- alert HEAD- normal atraumatic, no neck masses, normal thyroid, no jvd RESPIRATORY- appears well, vitals normal, no respiratory distress, acyanotic, normal RR, ear and throat exam is normal, neck free of mass or lymphadenopathy, chest clear, no wheezing, crepitations, rhonchi, normal symmetric air entry CVS- regular rate and rhythm, S1, S2 normal, no murmur, click, rub or gallop ABDOMEN-slight tenderness to palpation RLQ. +BS NEURO- Grossly normal EXTREMITIES- extremities normal, atraumatic, no cyanosis or edema  Plan  * Influenza A- improving. Continue tamiflu, levaquin. D/c steroids. * Htn-fluctuating, but better. Resume home meds for the long term.  *BPH- on finasteride.  * Rectus sheath hematoma- related to excessive intraabdominal pressure from cough. Watch. * constipation- supportive care. * dvt/gi prophylaxis.  Condition fair.      Rufus Cypert 05/06/2011 5:16 PM Pager: [phone].

## 2011-05-06 NOTE — Progress Notes (Signed)
05-06-11 NSG:  Pt has decided to drink the contrast and have the CT.  CT tech notified and are bringing new contrast.  Abd is s any changes in the past 2 hours.  I have marked the rlq where it is firmer.  Pt vss, and NAD.   BLE edema is gone now and pt states he has lost 4 pounds since yesterday.

## 2011-05-06 NOTE — Progress Notes (Signed)
05-06-11 NSG:  Pt c/o increased pain in abd and points to rlq, this area is noted to be firmer than it was earlier in the shift, and abd is no longer symetrical it is larger on theLeft side than right.  Pt states I went to the bathroom about an hour ago and was able to pass gas, then returned to bed and sneezed 8 times and that is when this pain increased.   Refusing more pain meds or xanax at this time.  BS remain +, abd is remains soft but slightly firmer in that 2" round area rlq.  VSS.   Continues to refuse to drink CT contrast.   No real distress at this time will continue to monitor.

## 2011-05-06 NOTE — Progress Notes (Signed)
05-06-11 NSG:  Continues to refuse Contrast for CT states "I won't ever drink it - I can't tolerate it and I don't want the CT done - my hernia is just fine"  States he is passing gas and is noted to tolerate his diet well, denies any pain in his abd and denies nausea.  Abd is soft c + bs.   Much education done pt verbalizes understanding but states "if doctor wants to check on my hernia he is going to have to do it where I don't have to drink that stuff.

## 2011-05-07 LAB — CBC
MCH: 29.4 pg (ref 26.0–34.0)
MCHC: 33.6 g/dL (ref 30.0–36.0)
MCV: 87.5 fL (ref 78.0–100.0)
Platelets: 170 10*3/uL (ref 150–400)
RBC: 5.58 MIL/uL (ref 4.22–5.81)

## 2011-05-07 LAB — BASIC METABOLIC PANEL
CO2: 34 mEq/L — ABNORMAL HIGH (ref 19–32)
Calcium: 9 mg/dL (ref 8.4–10.5)
Creatinine, Ser: 0.99 mg/dL (ref 0.50–1.35)
GFR calc non Af Amer: 80 mL/min — ABNORMAL LOW (ref >90)
Glucose, Bld: 122 mg/dL — ABNORMAL HIGH (ref 70–99)

## 2011-05-07 MED ORDER — ATENOLOL 25 MG PO TABS
25.0000 mg | ORAL_TABLET | Freq: Every day | ORAL | Status: DC
  Administered 2011-05-07 – 2011-05-10 (×4): 25 mg via ORAL
  Filled 2011-05-07 (×4): qty 1

## 2011-05-07 NOTE — Progress Notes (Signed)
Patient transfered to 1307, Patient informed of the transfer and reason for the transfer and patient verbalized understanding. Alert and oriented X4 denies and pain distress prior to transfer. Reported off to [facility] on the 3rd floor and patient transferred via wheelchair.

## 2011-05-07 NOTE — Progress Notes (Signed)
SUBJECTIVE Says he had shower today. Worried to come off oxygen.   1. Dyspnea     Past Medical History  Diagnosis Date  . History of PTCA   . History of cardiac cath   . Hypertension   . Hyperlipidemia   . Depression   . BPH (benign prostatic hypertrophy)   . Shortness of breath   . Anxiety   . GERD (gastroesophageal reflux disease)    Current Facility-Administered Medications  Medication Dose Route Frequency Provider Last Rate Last Dose  . 0.9 %  sodium chloride infusion  250 mL Intravenous PRN Suella Grove, MD      . acetaminophen (TYLENOL) tablet 650 mg  650 mg Oral Q4H PRN Suella Grove, MD   650 mg at 05/07/11 1105  . albuterol (PROVENTIL) (5 MG/ML) 0.5% nebulizer solution 2.5 mg  2.5 mg Nebulization QID Mane Consolo, MD   2.5 mg at 05/07/11 1617  . albuterol (PROVENTIL) (5 MG/ML) 0.5% nebulizer solution 2.5 mg  2.5 mg Nebulization Q6H PRN Ronelle Smallman, MD   2.5 mg at 05/05/11 0549  . ALPRAZolam [name]) tablet 0.25 mg  0.25 mg Oral BID PRN Suella Grove, MD   0.25 mg at 05/03/11 2041  . amLODipine (NORVASC) tablet 10 mg  10 mg Oral Daily Seymore Brodowski, MD   10 mg at 05/07/11 1043  . aspirin EC tablet 81-162 mg  81-162 mg Oral Daily Suella Grove, MD   81 mg at 05/07/11 1043  . atenolol (TENORMIN) tablet 25 mg  25 mg Oral Daily Shyne Lehrke, MD      . docusate sodium (COLACE) capsule 100 mg  100 mg Oral BID PRN Nasiya Pascual, MD   100 mg at 05/04/11 0929  . finasteride (PROSCAR) tablet 5 mg  5 mg Oral Daily Suella Grove, MD   5 mg at 05/07/11 1043  . guaiFENesin (MUCINEX) 12 hr tablet 1,200 mg  1,200 mg Oral BID Noris Kulinski, MD   1,200 mg at 05/07/11 1043  . ipratropium (ATROVENT) nebulizer solution 0.5 mg  0.5 mg Nebulization QID Chancey Cullinane, MD   0.5 mg at 05/07/11 1617  . ipratropium (ATROVENT) nebulizer solution 0.5 mg  0.5 mg Nebulization Q6H PRN Madalee Altmann, MD   0.5 mg at 05/05/11 0549  . nicotine (NICODERM CQ - dosed in mg/24 hr) patch 7 mg  7 mg Transdermal Daily Suella Grove, MD    7 mg at 05/07/11 1041  . nitroGLYCERIN (NITROSTAT) SL tablet 0.4 mg  0.4 mg Sublingual Q5 min PRN Suella Grove, MD      . nystatin (MYCOSTATIN) 100000 UNIT/ML suspension 1,000,000 Units  10 mL Oral QID [name], MD   1,000,000 Units at 05/07/11 1803  . ondansetron (ZOFRAN) injection 4 mg  4 mg Intravenous Q6H PRN Suella Grove, MD   4 mg at 05/02/11 1547  . oseltamivir (TAMIFLU) capsule 75 mg  75 mg Oral BID Chimere Klingensmith, MD   75 mg at 05/07/11 1043  . pantoprazole (PROTONIX) EC tablet 40 mg  40 mg Oral Q1200 Cartrell Bentsen, MD   40 mg at 05/07/11 1227  . sodium chloride 0.9 % injection 3 mL  3 mL Intravenous Q12H Suella Grove, MD   3 mL at 05/07/11 1046  . sodium chloride 0.9 % injection 3 mL  3 mL Intravenous PRN Suella Grove, MD      . traMADol Janean Sark) tablet 100 mg  100 mg Oral Q6H PRN [name], MD   100 mg at 05/06/11 0726  .  zolpidem (AMBIEN) tablet 5 mg  5 mg Oral QHS PRN Danija Gosa, MD   5 mg at 05/06/11 2317  . DISCONTD: predniSONE (DELTASONE) tablet 10 mg  10 mg Oral Daily Angeliyah Kirkey, MD   10 mg at 05/07/11 1043   No Known Allergies Principal Problem:  *Shortness of breath Active Problems:  Hypertension  Influenza A  Hypoxemia   Vital signs in last 24 hours: Temp:  [97.5 F (36.4 C)-98 F (36.7 C)] 97.7 F (36.5 C) (01/21 1400) Pulse Rate:  [89-93] 89  (01/21 1400) Resp:  [18] 18  (01/21 1400) BP: (132-143)/(80-92) 132/80 mmHg (01/21 1400) SpO2:  [90 %-95 %] 95 % (01/21 1620) Weight:  [89.7 kg (197 lb 12 oz)] 89.7 kg (197 lb 12 oz) (01/21 0355) Weight change: 1.158 kg (2 lb 8.8 oz) Last BM Date: 05/05/11  Intake/Output from previous day: 01/20 0701 - 01/21 0700 In: 0  Out: 650 [Urine:650] Intake/Output this shift:    Lab Results:  Basename 05/07/11 0520 05/06/11 0432  WBC 11.5* 12.6*  HGB 16.4 15.9  HCT 48.8 47.4  PLT 170 151   BMET  Basename 05/07/11 0520 05/06/11 0432  NA 136 137  K 4.5 3.4*  CL 96 95*  CO2 34* 35*  GLUCOSE 122* 96  BUN  15 17  CREATININE 0.99 0.97  CALCIUM 9.0 8.6    Studies/Results: Ct Abdomen Pelvis W Contrast  05/06/2011  *RADIOLOGY REPORT*  Clinical Data: Suspected epigastric hernia  CT ABDOMEN AND PELVIS WITH CONTRAST  Technique:  Multidetector CT imaging of the abdomen and pelvis was performed following the standard protocol during bolus administration of intravenous contrast.  Contrast: OMNIPAQUE IOHEXOL 300 MG/ML IV SOLN  Comparison: 10/19/2009  Findings: Dependent atelectasis at the lung bases.  Liver, spleen, pancreas, and adrenal glands are within normal limits.  Cholelithiasis.  No associated inflammatory changes.  Kidneys are notable for a tiny left renal cysts.  No hydronephrosis.  No evidence of bowel obstruction.  Normal appendix.  Colonic diverticulosis, without associated inflammatory changes.  Atherosclerotic calcifications of the abdominal aorta and branch vessels.  No abdominopelvic ascites.  No suspicious abdominopelvic lymphadenopathy.  Prostatomegaly, measuring 5.7 cm in transverse dimension.  Bladder is within normal limits.  No evidence of ventral hernia.  Tiny fat-containing left femoral and inguinal hernias (series 2/images 78 and 82).  Asymmetric enlargement of the right rectus abdominus muscle with suspected 2.5 x 3.3 cm focus of hemorrhage inferiorly (series 2/image 57).  Degenerative changes of the visualized thoracolumbar spine.  IMPRESSION: No evidence of ventral hernia.  Asymmetric enlargement of the right rectus abdominus muscle with suspected intramuscular hematoma.  Additional ancillary findings as above.  Original Report Authenticated By: Charline Bills, M.D.    Medications: I have reviewed the patient's current medications.   Physical exam GENERAL- alert HEAD- normal atraumatic, no neck masses, normal thyroid, no jvd RESPIRATORY- appears well, vitals normal, no respiratory distress, acyanotic, normal RR, ear and throat exam is normal, neck free of mass or  lymphadenopathy, chest clear, no wheezing, crepitations, rhonchi, normal symmetric air entry CVS- regular rate and rhythm, S1, S2 normal, no murmur, click, rub or gallop ABDOMEN- abdomen is soft without significant tenderness, masses, organomegaly or guarding NEURO- Grossly normal EXTREMITIES- extremities normal, atraumatic, no cyanosis or edema  Plan  * Influenza A- improving. Continue tamiflu, levaquin. D/c steroids. * Htn-fluctuating. Resume atenolol. *BPH- on finasteride.   *Rectus sheath hematoma- stable.  * constipation- supportive care.  * dvt/gi prophylaxis.  Condition fair,  ambulate. Hopefully home next day or so.     Maven Varelas 05/07/2011 7:33 PM Pager: [phone].

## 2011-05-08 MED ORDER — FLEET ENEMA 7-19 GM/118ML RE ENEM
1.0000 | ENEMA | Freq: Once | RECTAL | Status: AC
  Administered 2011-05-08: 1 via RECTAL
  Filled 2011-05-08: qty 1

## 2011-05-08 MED ORDER — LACTULOSE 10 GM/15ML PO SOLN
20.0000 g | Freq: Once | ORAL | Status: AC
  Administered 2011-05-08: 20 g via ORAL
  Filled 2011-05-08: qty 30

## 2011-05-08 MED ORDER — DOCUSATE SODIUM 100 MG PO CAPS
100.0000 mg | ORAL_CAPSULE | Freq: Once | ORAL | Status: AC
  Administered 2011-05-08: 100 mg via ORAL
  Filled 2011-05-08: qty 1

## 2011-05-08 MED ORDER — TIOTROPIUM BROMIDE MONOHYDRATE 18 MCG IN CAPS
18.0000 ug | ORAL_CAPSULE | Freq: Every day | RESPIRATORY_TRACT | Status: DC
  Administered 2011-05-10: 18 ug via RESPIRATORY_TRACT
  Filled 2011-05-08: qty 5

## 2011-05-08 MED ORDER — FLUTICASONE-SALMETEROL 250-50 MCG/DOSE IN AEPB
1.0000 | INHALATION_SPRAY | Freq: Two times a day (BID) | RESPIRATORY_TRACT | Status: DC
  Administered 2011-05-08 – 2011-05-10 (×4): 1 via RESPIRATORY_TRACT
  Filled 2011-05-08: qty 14

## 2011-05-08 NOTE — Progress Notes (Signed)
Patient ambulated on room air, O2 sats 84%.

## 2011-05-08 NOTE — Progress Notes (Signed)
Mr Degroat was admitted on 04/29/11 with flu like symptoms, which had been on going for 4 days prior to admission. He had confirmed Influenza type A. He was eventually placed on tamiflu and will complete course before d/c. He was also treated as if superimposed CAP. He developed rectus sheath muscle hematoma from severe coughing. I discussed findings with Dr Martin([facility]), who opined no surgical intervention warranted unless he developed infection from the the hematoma. Mr Kolbe has made very slow progress, although he is no longer toxic, and should complete tamiflu/levaquin before d/c. The main challenge now is mobilizing him. He was walked in the hall way today and became dyspneic, desaturating to 84% on room air. He has been tapered off steroids. We have discussed discharge options and at present he is ok with HHS reluctantly. I talked with his son Faylene Million, who is actively involved in his care. The son is willing to take him in for a few days till he is able to manage by himself. He needs to work with Physical therapy, and the hope is that he can be safely discharged in the next day or 2.  S- complaints of constipation O/E- comfortable at rest but gets dyspneic off oxygen  Vitals- stable, Please see chart. HEENT- No jvd. RS- bilateral air entry, no rhonchi or rales or wheezes. CVS- S1S2.RRR. Abdomen- soft, non tender. +BS. CNS- grossly intact. Extremities- no pedal edema.  Plan  * Influenza A- Resolved. Completed abx course. Now deconditioned from severity of illness. * Htn-controlled. *BPH- on finasteride.  *Rectus sheath hematoma- stable.  * constipation- supportive care.  * dvt/gi prophylaxis.  Condition fair, ambulate. Hopefully home next day or so.

## 2011-05-08 NOTE — Progress Notes (Signed)
PT Cancellation Note  Treatment cancelled today due to pt currently wants to nap. He then wants to take a shower and try to get his bowels to move. While he does acknowledge that activity will help with his bowel management, he is "too give out" right now to even do any bed exercises.  Will try back later today  Donnetta Hail 05/08/2011, 9:53 AM

## 2011-05-08 NOTE — Evaluation (Signed)
Physical Therapy Evaluation Patient Details Name: Mitchell [name] MRN: [id number] DOB: 12/25/1940 Today's Date: 05/08/2011  Recommend HHPT and RW at D/C  Problem List:  Patient Active Problem List  Diagnoses  . Shortness of breath  . Hypertension  . Influenza A  . Hypoxemia    Past Medical History:  Past Medical History  Diagnosis Date  . History of PTCA   . History of cardiac cath   . Hypertension   . Hyperlipidemia   . Depression   . BPH (benign prostatic hypertrophy)   . Shortness of breath   . Anxiety   . GERD (gastroesophageal reflux disease)    Past Surgical History:  Past Surgical History  Procedure Date  . Cataract extraction   . Ganglion cyst removed   . Cardiac catheterization 05/11/2005    PCI OF LAD    PT Assessment/Plan/Recommendation PT Assessment Clinical Impression Statement: Pt with deconditioning from illness who needs a RW for gait support and O2 for respiratory support, He states he does not want to go  a rehab facility, but will consider HHPT. He needs continued PT at D/C PT Recommendation/Assessment: Patient will need skilled PT in the acute care venue PT Problem List: Decreased strength;Decreased activity tolerance;Decreased mobility;Decreased knowledge of use of DME Barriers to Discharge: Decreased caregiver support PT Therapy Diagnosis : Difficulty walking;Generalized weakness PT Plan PT Frequency: Min 3X/week PT Treatment/Interventions: DME instruction;Gait training;Functional mobility training;Therapeutic exercise;Stair training PT Recommendation Recommendations for Other Services: OT consult Follow Up Recommendations: Home health PT Equipment Recommended: Rolling walker with 5" wheels PT Goals  Acute Rehab PT Goals PT Goal Formulation: With patient Time For Goal Achievement: 2 weeks Pt will go Sit to Stand: with modified independence PT Goal: Sit to Stand - Progress: Goal set today Pt will Ambulate: >150 feet;with modified  independence;with least restrictive assistive device PT Goal: Ambulate - Progress: Goal set today Pt will Go Up / Down Stairs: 1-2 stairs;with modified independence PT Goal: Up/Down Stairs - Progress: Goal set today Pt will Perform Home Exercise Program: with supervision, verbal cues required/provided PT Goal: Perform Home Exercise Program - Progress: Goal set today  PT Evaluation Precautions/Restrictions    Prior Functioning  Home Living Lives With: Alone Receives Help From: Friend(s) Type of Home: Apartment Home Layout: One level Home Access: Stairs to enter [employer] of Steps: 1 Home Adaptive Equipment: None Prior Function Level of Independence: Independent with gait;Independent with transfers Cognition Cognition Arousal/Alertness: Awake/alert Overall Cognitive Status: Appears within functional limits for tasks assessed Sensation/Coordination   Extremity Assessment RLE Assessment RLE Assessment: Within Functional Limits LLE Assessment LLE Assessment: Within Functional Limits Mobility (including Balance) Bed Mobility Bed Mobility: Yes Supine to Sit: 7: Independent Sit to Supine: 7: Independent Transfers Sit to Stand: Other (comment);4: Min assist (needs cues for pushing up with arms ) Sit to Stand Details (indicate cue type and reason): pt needs frequent cues to extend hips to stand up straight Stand to Sit: 6: Modified independent (Device/Increase time) Ambulation/Gait Ambulation/Gait: Yes Ambulation/Gait Assistance: 4: Min assist Ambulation/Gait Assistance Details (indicate cue type and reason): pt used RW and needed cues to extend trunk to improve posture and activate core in gait Ambulation Distance (Feet): 100 Feet Assistive device: Rolling walker (O2 tank at 3L) Gait Pattern: Step-through pattern;Trunk flexed (pt tends to lean heavily on RW, can extend trunk with cues) Gait velocity: slow Stairs: No Wheelchair Mobility Wheelchair Mobility: No    Posture/Postural Control Posture/Postural Control: No significant limitations (pt with generaized deconditioning from  bedrest during recove) Balance Balance Assessed: Yes Static Sitting Balance Static Sitting - Balance Support: No upper extremity supported Static Sitting - Level of Assistance: 7: Independent Static Sitting - Comment/# of Minutes: 5 Exercise  Other Exercises Other Exercises: bilateral U/E hip to hip to activate core in sitting, standing., standing glute sets, sit to stand, static standing balance,  End of Session PT - End of Session Equipment Utilized During Treatment:  (RW) Activity Tolerance: Patient tolerated treatment well (pt concerned about wanting to have a BM) Patient left: in chair General Behavior During Session: [facility] for tasks performed Cognition: [facility] for tasks performed  Donnetta Hail 05/08/2011, 3:28 PM

## 2011-05-09 LAB — CBC
MCH: 29.5 pg (ref 26.0–34.0)
MCV: 87.1 fL (ref 78.0–100.0)
Platelets: 176 10*3/uL (ref 150–400)
RDW: 14 % (ref 11.5–15.5)
WBC: 16.3 10*3/uL — ABNORMAL HIGH (ref 4.0–10.5)

## 2011-05-09 LAB — BASIC METABOLIC PANEL
Calcium: 8.6 mg/dL (ref 8.4–10.5)
Creatinine, Ser: 0.96 mg/dL (ref 0.50–1.35)
GFR calc Af Amer: >90 mL/min (ref >90)

## 2011-05-09 MED ORDER — MAGNESIUM CITRATE PO SOLN
1.0000 | Freq: Once | ORAL | Status: AC
  Administered 2011-05-09: 1 via ORAL
  Filled 2011-05-09: qty 296

## 2011-05-09 MED ORDER — POLYETHYLENE GLYCOL 3350 17 G PO PACK
17.0000 g | PACK | Freq: Two times a day (BID) | ORAL | Status: DC
  Administered 2011-05-09 – 2011-05-10 (×3): 17 g via ORAL
  Filled 2011-05-09 (×4): qty 1

## 2011-05-09 MED ORDER — DOCUSATE SODIUM 100 MG PO CAPS
100.0000 mg | ORAL_CAPSULE | Freq: Two times a day (BID) | ORAL | Status: DC
  Administered 2011-05-09 – 2011-05-10 (×3): 100 mg via ORAL
  Filled 2011-05-09 (×4): qty 1

## 2011-05-09 NOTE — Progress Notes (Signed)
PROGRESS NOTE  Mitchell [name] RUE:[id number] DOB: Oct 18, 1940 DOA: 04/29/2011 PCP: Verdis Prime, M.D. (cardiologist)  Brief narrative: 71 year old man admitted January 13. Presented to the emergency department with shortness of breath for several days. It made for cough, shortness of breath, possible bronchitis/tracheitis, possible acute coronary syndrome.  Past medical history: Hypertension, hyperlipidemia, depression, history of pipe smoking, 5-6 alcoholic drinks every other day  Consultants:  Physical therapy: Home health physical therapy, rolling walker at discharge.  Procedures:  January 14: 2-D echocardiogram:  Antibiotics:  January 14-20: Levaquin  January 16-20: Tamiflu  Interim History: Chart reviewed in detail. Currently on 5 L nasal cannula. 2 stools reported yesterday. Subjective: Complains of few bowel movements. Breathing okay.  Objective: Filed Vitals:   05/08/11 1943 05/08/11 2010 05/09/11 0607 05/09/11 0915  BP:  136/71 127/80   Pulse:  70 81   Temp:  98.6 F (37 C) 98.5 F (36.9 C)   TempSrc:  Oral Oral   Resp:  16 16   Height:      Weight:      SpO2: 89% 94% 95% 93%    Intake/Output Summary (Last 24 hours) at 05/09/11 1132 Last data filed at 05/09/11 0725  Gross per 24 hour  Intake    240 ml  Output      0 ml  Net    240 ml    Exam:  General: Appears anxious. Nontoxic. Cardiovascular: Regular rate and rhythm. No murmur, rub or gallop. No lower extremity edema. Respiratory: Clear to auscultation bilaterally. No wheezes, rales or rhonchi. Normal respiratory effort.  Data Reviewed: Basic Metabolic Panel:  Lab 05/09/11 1478 05/07/11 0520 05/06/11 0432 05/05/11 0419 05/04/11 0400  NA 133* 136 137 137 138  K 4.0 4.5 -- -- --  CL 99 96 95* 100 100  CO2 29 34* 35* 31 31  GLUCOSE 98 122* 96 102* 127*  BUN 23 15 17 22 21   CREATININE 0.96 0.99 0.97 0.91 0.95  CALCIUM 8.6 9.0 8.6 8.6 8.6  MG -- -- -- -- --  PHOS -- -- -- -- --   Liver  Function Tests:  Lab 05/06/11 0432 05/03/11 0404  AST 23 21  ALT 43 22  ALKPHOS 48 55  BILITOT 0.4 0.3  PROT 5.5* 6.0  ALBUMIN 2.7* 2.8*   CBC:  Lab 05/09/11 0335 05/07/11 0520 05/06/11 0432 05/05/11 0419 05/04/11 0400  WBC 16.3* 11.5* 12.6* 10.2 11.6*  NEUTROABS -- -- -- -- --  HGB 15.3 16.4 15.9 16.2 16.2  HCT 45.2 48.8 47.4 47.8 48.2  MCV 87.1 87.5 87.0 87.5 88.1  PLT 176 170 151 139* 131*    Recent Results (from the past 240 hour(s))  URINE CULTURE     Status: Normal   Collection Time   04/29/11  6:09 PM      Component Value Range Status Comment   Specimen Description URINE, CLEAN CATCH   Final    Special Requests NONE   Final    Setup Time [id number]   Final    Colony Count 5,000 COLONIES/ML   Final    Culture INSIGNIFICANT GROWTH   Final    Report Status 05/01/2011 FINAL   Final      Studies: Ct Angio Chest W/cm &/or Wo Cm  04/29/2011  *RADIOLOGY REPORT*  Clinical Data: Shortness of breath, cough, chest pain  CT ANGIOGRAPHY CHEST  Technique:  Multidetector CT imaging of the chest using the standard protocol during bolus administration of intravenous contrast. Multiplanar reconstructed images  including MIPs were obtained and reviewed to evaluate the vascular anatomy.  Contrast: OMNIPAQUE IOHEXOL 300 MG/ML IV SOLN  Comparison: 04/29/2011 chest x-ray  Findings: Limited contrast opacification of the pulmonary arteries. Within the limits of the study, there is no large central or proximal hilar pulmonary embolus demonstrated.  Difficult to exclude smaller segmental or subsegmental pulmonary emboli.  Mild atherosclerosis of the thoracic aorta.  Negative for aneurysm or dissection.  Normal heart size.  No pericardial or pleural effusion.  No hiatal hernia.  Mildly prominent subcarinal lymph nodes, likely reactive.  No definite adenopathy.  Retained secretions present in the lower trachea, images 32-39.  No acute finding in the upper abdomen imaged.  Thoracic spondylosis and  degenerative changes noted.  Lung windows demonstrate mild hyperinflation.  Motion artifact in the lower lobes.  No focal pneumonia, collapse, consolidation, airspace process, pneumonia, or interstitial changes.  IMPRESSION: Limited exam but no large central or proximal hilar pulmonary embolus.  Lower trachea retained secretions.  No acute intrathoracic process, pneumonia or CHF  Original Report Authenticated By: Judie Petit. Ruel Favors, M.D.   Ct Abdomen Pelvis W Contrast  05/06/2011  *RADIOLOGY REPORT*  Clinical Data: Suspected epigastric hernia  CT ABDOMEN AND PELVIS WITH CONTRAST  Technique:  Multidetector CT imaging of the abdomen and pelvis was performed following the standard protocol during bolus administration of intravenous contrast.  Contrast: OMNIPAQUE IOHEXOL 300 MG/ML IV SOLN  Comparison: 10/19/2009  Findings: Dependent atelectasis at the lung bases.  Liver, spleen, pancreas, and adrenal glands are within normal limits.  Cholelithiasis.  No associated inflammatory changes.  Kidneys are notable for a tiny left renal cysts.  No hydronephrosis.  No evidence of bowel obstruction.  Normal appendix.  Colonic diverticulosis, without associated inflammatory changes.  Atherosclerotic calcifications of the abdominal aorta and branch vessels.  No abdominopelvic ascites.  No suspicious abdominopelvic lymphadenopathy.  Prostatomegaly, measuring 5.7 cm in transverse dimension.  Bladder is within normal limits.  No evidence of ventral hernia.  Tiny fat-containing left femoral and inguinal hernias (series 2/images 78 and 82).  Asymmetric enlargement of the right rectus abdominus muscle with suspected 2.5 x 3.3 cm focus of hemorrhage inferiorly (series 2/image 57).  Degenerative changes of the visualized thoracolumbar spine.  IMPRESSION: No evidence of ventral hernia.  Asymmetric enlargement of the right rectus abdominus muscle with suspected intramuscular hematoma.  Additional ancillary findings as above.  Original  Report Authenticated By: Charline Bills, M.D.   Dg Chest Port 1 View  04/29/2011  *RADIOLOGY REPORT*  Clinical Data: CHF, shortness of breath, hypertension  PORTABLE CHEST - 1 VIEW  Comparison: 05/28/2007  Findings: Normal heart size and vascularity.  Monitor leads and external tubing overlie the chest.  No focal airspace disease, collapse, consolidation, large effusion or pneumothorax.  Trachea midline.  Degenerative thoracic spine.   Prominent lower lobe vascular markings.  Lungs are mildly hyperinflated.  IMPRESSION: Mild hyperinflation.  No acute chest process.  Original Report Authenticated By: Judie Petit. TREVOR Miles Costain, M.D.    Scheduled Meds:   . albuterol  2.5 mg Nebulization QID  . amLODipine  10 mg Oral Daily  . aspirin EC  81-162 mg Oral Daily  . atenolol  25 mg Oral Daily  . docusate sodium  100 mg Oral Once  . finasteride  5 mg Oral Daily  . Fluticasone-Salmeterol  1 puff Inhalation BID  . guaiFENesin  1,200 mg Oral BID  . ipratropium  0.5 mg Nebulization QID  . lactulose  20 g Oral Once  .  nicotine  7 mg Transdermal Daily  . nystatin  10 mL Oral QID  . pantoprazole  40 mg Oral Q1200  . sodium chloride  3 mL Intravenous Q12H  . sodium phosphate  1 enema Rectal Once  . sodium phosphate  1 enema Rectal Once  . tiotropium  18 mcg Inhalation Daily   Continuous Infusions:    Assessment/Plan: 1. Influenza A: Clinically resolved. Completed Tamiflu. 2. Hypoxia: Oxygenation 84% on room air with ambulation. Home oxygen. Secondary to deconditioning. Currently on 5 L per minute nasal cannula. Wean as tolerated. 3. Leukocytosis: Possibly related to steroids. No evidence of acute infection. 4. Rectus sheath hematoma: Stable. Secondary to coughing. Per general surgery no intervention warranted unless hematoma becomes infected. 5. Constipation: Not currently on any scheduled medications. The patient refused bowel prep. Start scheduled medications. 6. Hypertension: Controlled. 7. Possible  obstructive sleep apnea: Consider outpatient sleep study. 8. Alcohol abuse: Will encourage cessation. 9. Possible subclinical COPD: Followup as an outpatient.   Code Status: Full code. Family Communication: Disposition Plan: Home with home health.   Brendia Sacks, MD  [facility] Pager [phone] 05/09/2011, 11:32 AM    LOS: 10 days

## 2011-05-09 NOTE — Plan of Care (Signed)
Problem: Phase II Progression Outcomes Goal: Dyspnea controlled w/progressive activity Outcome: Progressing Respiratory inhalers in progress-lungs clear;continue to monitor w/ respiratory therapist. AW

## 2011-05-09 NOTE — Progress Notes (Signed)
Physical Therapy Treatment Patient Details Name: Mitchell [name] MRN: [id number] DOB: [birth year] Today's Date: 05/09/2011  PT Assessment/Plan  PT - Assessment/Plan Comments on Treatment Session: Pt improved today, but still needs HHPT and RW at home.  Pt with decreased O2 sats with gait, working to wean from O2 PT Plan: Discharge plan remains appropriate PT Frequency: Min 3X/week Recommendations for Other Services: OT consult Follow Up Recommendations: Home health PT Equipment Recommended: Rolling walker with 5" wheels PT Goals  Acute Rehab PT Goals PT Goal: Sit to Stand - Progress: Progressing toward goal Pt will Ambulate: >150 feet;with modified independence;with least restrictive assistive device PT Goal: Ambulate - Progress: Progressing toward goal Pt will Perform Home Exercise Program: with supervision, verbal cues required/provided PT Goal: Perform Home Exercise Program - Progress: Progressing toward goal  PT Treatment Precautions/Restrictions  Restrictions Weight Bearing Restrictions: No Mobility (including Balance) Bed Mobility Bed Mobility: Yes Supine to Sit: 7: Independent Sit to Supine: 7: Independent Transfers Transfers: Yes Sit to Stand: 6: Modified independent (Device/Increase time) Stand to Sit: 6: Modified independent (Device/Increase time) Ambulation/Gait Ambulation/Gait: Yes Ambulation/Gait Assistance: 4: Min assist Ambulation/Gait Assistance Details (indicate cue type and reason): pt refused to use RW, but wanted to use hand hold assist.  He stayed forward bent at trunk and seemed to have difficiulty with stepping.  He had some pain in right abdomen with attempts at hip flexion on the right Ambulation Distance (Feet): 200 Feet Assistive device: 1 person hand held assist Gait Pattern: Step-through pattern;Decreased hip/knee flexion - right;Antalgic;Trunk flexed Gait velocity: slow Stairs: No Wheelchair Mobility Wheelchair Mobility: No  Posture/Postural  Control Posture/Postural Control: No significant limitations Static Sitting Balance Static Sitting - Level of Assistance: 7: Independent Exercise  Other Exercises Other Exercises: pt issued handout of exercises and says he can do them. copy in shadow chart End of Session PT - End of Session Activity Tolerance: Patient tolerated treatment well Patient left: in bed Nurse Communication: Mobility status for ambulation General Behavior During Session: [facility] for tasks performed Cognition: [facility] for tasks performed  Donnetta Hail 05/09/2011, 4:56 PM

## 2011-05-10 MED ORDER — ALBUTEROL SULFATE HFA 108 (90 BASE) MCG/ACT IN AERS: 2.0000 | INHALATION_SPRAY | RESPIRATORY_TRACT | Status: DC | PRN

## 2011-05-10 MED ORDER — DSS 100 MG PO CAPS: 100.0000 mg | ORAL_CAPSULE | Freq: Two times a day (BID) | ORAL | Status: AC

## 2011-05-10 MED ORDER — IPRATROPIUM-ALBUTEROL 18-103 MCG/ACT IN AERO
2.0000 | INHALATION_SPRAY | Freq: Four times a day (QID) | RESPIRATORY_TRACT | Status: DC
  Administered 2011-05-10: 2 via RESPIRATORY_TRACT
  Filled 2011-05-10: qty 14.7

## 2011-05-10 MED ORDER — IPRATROPIUM-ALBUTEROL 18-103 MCG/ACT IN AERO: 2.0000 | INHALATION_SPRAY | Freq: Four times a day (QID) | RESPIRATORY_TRACT | Status: DC

## 2011-05-10 MED ORDER — ALBUTEROL SULFATE HFA 108 (90 BASE) MCG/ACT IN AERS
2.0000 | INHALATION_SPRAY | RESPIRATORY_TRACT | Status: DC | PRN
  Filled 2011-05-10: qty 6.7

## 2011-05-10 MED ORDER — ALBUTEROL SULFATE (5 MG/ML) 0.5% IN NEBU
2.5000 mg | INHALATION_SOLUTION | Freq: Two times a day (BID) | RESPIRATORY_TRACT | Status: DC
  Administered 2011-05-10: 2.5 mg via RESPIRATORY_TRACT
  Filled 2011-05-10: qty 0.5

## 2011-05-10 MED ORDER — POLYETHYLENE GLYCOL 3350 17 G PO PACK: 17.0000 g | PACK | Freq: Two times a day (BID) | ORAL | Status: AC | PRN

## 2011-05-10 NOTE — Progress Notes (Signed)
Pt D/C home with home health . Pt alert and oriented, no signs of distress, denies pain, sats at 86% on RM air and 96 % on 2L O2. D/C instructions and  Medication Instructions done, pt verbalizes understanding.

## 2011-05-10 NOTE — Discharge Summary (Signed)
Physician Discharge Summary  AMBER GUTHRIDGE ZOX:[id number] DOB: May 18, 1940 DOA: 04/29/2011  PCP: Verdis Prime, M.D. (cardiologist)  Admit date: 04/29/2011 Discharge date: 05/10/2011  Discharge Diagnoses:  1. Influenza A 2. Persistent hypoxia, secondary to above 3. Rectus sheath hematoma 4. Constipation, resolved 5. Possible subclinical COPD  Discharge Condition: Improved  Disposition: Home with home oxygen 3 L per minute nasal cannula. Note home health physical therapy and rolling walker was recommended the patient refused.  History of present illness:  71 year old man admitted January 13. Presented to the emergency department with shortness of breath for several days. Was admitted for cough, shortness of breath, possible bronchitis/tracheitis, possible acute coronary syndrome.  Hospital Course:  Mr. Cookston was admitted to the medical floor. He was treated for influenza with Tamiflu. He has significant coughing and developed a rectus sheath hematoma. There has been no evidence of further complication from this and Gen. surgery recommended no further evaluation at this time. This patient has made his recovery from influenza he is to remain hypoxic. He does have a history of pipe smoking. At this point he will be discharged home on home oxygen. Hopefully this can be weaned as he continues to improve over the next few weeks. He may have subclinical COPD and outpatient evaluation for this could be considered as clinically indicated based on his improvement. Constipation was resolved with aggressive bowel regimen. These and other issues delineated below. 1. Influenza A: Clinically resolved. Completed Tamiflu.  2. Hypoxia: Oxygenation 84% on room air with ambulation. Home oxygen. Secondary to deconditioning. 3 L per minute nasal cannula continuous.   3. Rectus sheath hematoma: Stable. Secondary to coughing. Per general surgery no intervention warranted unless hematoma becomes infected.   4. Constipation: Resolved. Continue bowel regimen as an outpatient.  5. Hypertension: Controlled.  6. Possible obstructive sleep apnea: Consider outpatient sleep study.  7. Alcohol abuse: Recommend cessation.  8. Possible subclinical COPD: Followup as an outpatient.  Consultants:  Physical therapy: Home health physical therapy, rolling walker at discharge.  Procedures:  January 14: 2-D echocardiogram: Left ventricle: The cavity size was normal. Systolic function was normal. The estimated ejection fraction was in the range of 60% to 65%. Wall motion was normal; there were no regional wall motion abnormalities. Antibiotics:  January 14-20: Levaquin   January 16-20: Tamiflu  Discharge Instructions  Discharge Orders    Future Orders Please Complete By Expires   Diet - low sodium heart healthy      Increase activity slowly      Discharge instructions      Comments:   Recommended ambulation with a rolling walker.     Medication List  As of 05/10/2011 11:58 AM   TAKE these medications         albuterol 108 (90 BASE) MCG/ACT inhaler   Commonly known as: PROVENTIL HFA;VENTOLIN HFA   Inhale 2 puffs into the lungs every 4 (four) hours as needed for wheezing or shortness of breath.      albuterol-ipratropium 18-103 MCG/ACT inhaler   Commonly known as: COMBIVENT   Inhale 2 puffs into the lungs 4 (four) times daily.      aspirin EC 81 MG tablet   Take 81-162 mg by mouth daily.      atenolol 25 MG tablet   Commonly known as: TENORMIN   Take 25 mg by mouth daily.      DSS 100 MG Caps   Take 100 mg by mouth 2 (two) times daily.  finasteride 5 MG tablet   Commonly known as: PROSCAR   Take 5 mg by mouth daily.      polyethylene glycol packet   Commonly known as: MIRALAX / GLYCOLAX   Take 17 g by mouth 2 (two) times daily as needed (constipation).           Follow-up Information    Follow up with Lesleigh Noe, MD in 1 week.   Contact information:   [address] [place] [place] [address] [phone]           The results of significant diagnostics from this hospitalization (including imaging, microbiology, ancillary and laboratory) are listed below for reference.    Significant Diagnostic Studies: Ct Angio Chest W/cm &/or Wo Cm  04/29/2011  *RADIOLOGY REPORT*  Clinical Data: Shortness of breath, cough, chest pain  CT ANGIOGRAPHY CHEST  Technique:  Multidetector CT imaging of the chest using the standard protocol during bolus administration of intravenous contrast. Multiplanar reconstructed images including MIPs were obtained and reviewed to evaluate the vascular anatomy.  Contrast: OMNIPAQUE IOHEXOL 300 MG/ML IV SOLN  Comparison: 04/29/2011 chest x-ray  Findings: Limited contrast opacification of the pulmonary arteries. Within the limits of the study, there is no large central or proximal hilar pulmonary embolus demonstrated.  Difficult to exclude smaller segmental or subsegmental pulmonary emboli.  Mild atherosclerosis of the thoracic aorta.  Negative for aneurysm or dissection.  Normal heart size.  No pericardial or pleural effusion.  No hiatal hernia.  Mildly prominent subcarinal lymph nodes, likely reactive.  No definite adenopathy.  Retained secretions present in the lower trachea, images 32-39.  No acute finding in the upper abdomen imaged.  Thoracic spondylosis and degenerative changes noted.  Lung windows demonstrate mild hyperinflation.  Motion artifact in the lower lobes.  No focal pneumonia, collapse, consolidation, airspace process, pneumonia, or interstitial changes.  IMPRESSION: Limited exam but no large central or proximal hilar pulmonary embolus.  Lower trachea retained secretions.  No acute intrathoracic process, pneumonia or CHF  Original Report Authenticated By: Judie Petit. Ruel Favors, M.D.   Ct Abdomen Pelvis W Contrast  05/06/2011  *RADIOLOGY REPORT*  Clinical Data: Suspected epigastric hernia  CT ABDOMEN  AND PELVIS WITH CONTRAST  Technique:  Multidetector CT imaging of the abdomen and pelvis was performed following the standard protocol during bolus administration of intravenous contrast.  Contrast: OMNIPAQUE IOHEXOL 300 MG/ML IV SOLN  Comparison: 10/19/2009  Findings: Dependent atelectasis at the lung bases.  Liver, spleen, pancreas, and adrenal glands are within normal limits.  Cholelithiasis.  No associated inflammatory changes.  Kidneys are notable for a tiny left renal cysts.  No hydronephrosis.  No evidence of bowel obstruction.  Normal appendix.  Colonic diverticulosis, without associated inflammatory changes.  Atherosclerotic calcifications of the abdominal aorta and branch vessels.  No abdominopelvic ascites.  No suspicious abdominopelvic lymphadenopathy.  Prostatomegaly, measuring 5.7 cm in transverse dimension.  Bladder is within normal limits.  No evidence of ventral hernia.  Tiny fat-containing left femoral and inguinal hernias (series 2/images 78 and 82).  Asymmetric enlargement of the right rectus abdominus muscle with suspected 2.5 x 3.3 cm focus of hemorrhage inferiorly (series 2/image 57).  Degenerative changes of the visualized thoracolumbar spine.  IMPRESSION: No evidence of ventral hernia.  Asymmetric enlargement of the right rectus abdominus muscle with suspected intramuscular hematoma.  Additional ancillary findings as above.  Original Report Authenticated By: Charline Bills, M.D.   Dg Chest Port 1 View  04/29/2011  *  RADIOLOGY REPORT*  Clinical Data: CHF, shortness of breath, hypertension  PORTABLE CHEST - 1 VIEW  Comparison: 05/28/2007  Findings: Normal heart size and vascularity.  Monitor leads and external tubing overlie the chest.  No focal airspace disease, collapse, consolidation, large effusion or pneumothorax.  Trachea midline.  Degenerative thoracic spine.   Prominent lower lobe vascular markings.  Lungs are mildly hyperinflated.  IMPRESSION: Mild hyperinflation.  No acute  chest process.  Original Report Authenticated By: Judie Petit. Ruel Favors, M.D.    Labs: Basic Metabolic Panel:  Lab 05/09/11 1610 05/07/11 0520 05/06/11 0432 05/05/11 0419 05/04/11 0400  NA 133* 136 137 137 138  K 4.0 4.5 -- -- --  CL 99 96 95* 100 100  CO2 29 34* 35* 31 31  GLUCOSE 98 122* 96 102* 127*  BUN 23 15 17 22 21   CREATININE 0.96 0.99 0.97 0.91 0.95  CALCIUM 8.6 9.0 8.6 8.6 8.6  MG -- -- -- -- --  PHOS -- -- -- -- --   Liver Function Tests:  Lab 05/06/11 0432  AST 23  ALT 43  ALKPHOS 48  BILITOT 0.4  PROT 5.5*  ALBUMIN 2.7*   CBC:  Lab 05/09/11 0335 05/07/11 0520 05/06/11 0432 05/05/11 0419 05/04/11 0400  WBC 16.3* 11.5* 12.6* 10.2 11.6*  NEUTROABS -- -- -- -- --  HGB 15.3 16.4 15.9 16.2 16.2  HCT 45.2 48.8 47.4 47.8 48.2  MCV 87.1 87.5 87.0 87.5 88.1  PLT 176 170 151 139* 131*    Time coordinating discharge: 45 minutes.  Signed:  Brendia Sacks, MD  [facility] 05/10/2011, 11:58 AM

## 2011-05-10 NOTE — Progress Notes (Signed)
Patient refused HHC but is agreeable to home 02; Home 02 orders faxed to [facility] (insurance provider for DME) will deliver 02 to the patient's room for discharge; B. Ave Filter RN, BSN, [place]

## 2011-05-10 NOTE — Progress Notes (Signed)
PROGRESS NOTE  Mitchell [name] ZOX:[id number] DOB: [birth year] DOA: 04/29/2011 PCP: Verdis Prime, M.D. (cardiologist)  Brief narrative: 71 year old man admitted January 13. Presented to the emergency department with shortness of breath for several days. Was admitted for cough, shortness of breath, possible bronchitis/tracheitis, possible acute coronary syndrome.  Past medical history: Hypertension, hyperlipidemia, depression, history of pipe smoking, 5-6 alcoholic drinks every other day  Consultants:  Physical therapy: Home health physical therapy, rolling walker at discharge.  Procedures:  January 14: 2-D echocardiogram: Left ventricle: The cavity size was normal. Systolic function was normal. The estimated ejection fraction was in the range of 60% to 65%. Wall motion was normal; there were no regional wall motion abnormalities.  Antibiotics:  January 14-20: Levaquin  January 16-20: Tamiflu  Interim History: Chart reviewed in detail. Has been weaned to 3 L per minute nasal cannula. Patient refuses home health services and walker. Agreeable to home oxygen. Subjective: Feels much better. Reports many bowel movements.  Objective: Filed Vitals:   05/09/11 2055 05/09/11 2145 05/10/11 0237 05/10/11 0644  BP:  110/73 110/73 106/65  Pulse:  74 74 72  Temp:  98.2 F (36.8 C) 98.2 F (36.8 C) 98.5 F (36.9 C)  TempSrc:  Oral Oral Oral  Resp:  18 18 18   Height:      Weight:      SpO2: 90% 92% 92% 91%    Intake/Output Summary (Last 24 hours) at 05/10/11 1119 Last data filed at 05/09/11 1830  Gross per 24 hour  Intake    600 ml  Output    200 ml  Net    400 ml    Exam:  General: Appears less anxious. Nontoxic. Cardiovascular: Regular rate and rhythm. No murmur, rub or gallop. No lower extremity edema. Respiratory: Clear to auscultation bilaterally. No wheezes, rales or rhonchi. Normal respiratory effort.  Data Reviewed: Basic Metabolic Panel:  Lab 05/09/11 8119 05/07/11  0520 05/06/11 0432 05/05/11 0419 05/04/11 0400  NA 133* 136 137 137 138  K 4.0 4.5 -- -- --  CL 99 96 95* 100 100  CO2 29 34* 35* 31 31  GLUCOSE 98 122* 96 102* 127*  BUN 23 15 17 22 21   CREATININE 0.96 0.99 0.97 0.91 0.95  CALCIUM 8.6 9.0 8.6 8.6 8.6  MG -- -- -- -- --  PHOS -- -- -- -- --   Liver Function Tests:  Lab 05/06/11 0432  AST 23  ALT 43  ALKPHOS 48  BILITOT 0.4  PROT 5.5*  ALBUMIN 2.7*   CBC:  Lab 05/09/11 0335 05/07/11 0520 05/06/11 0432 05/05/11 0419 05/04/11 0400  WBC 16.3* 11.5* 12.6* 10.2 11.6*  NEUTROABS -- -- -- -- --  HGB 15.3 16.4 15.9 16.2 16.2  HCT 45.2 48.8 47.4 47.8 48.2  MCV 87.1 87.5 87.0 87.5 88.1  PLT 176 170 151 139* 131*   Studies: Ct Angio Chest W/cm &/or Wo Cm  04/29/2011  *RADIOLOGY REPORT*  Clinical Data: Shortness of breath, cough, chest pain  CT ANGIOGRAPHY CHEST  Technique:  Multidetector CT imaging of the chest using the standard protocol during bolus administration of intravenous contrast. Multiplanar reconstructed images including MIPs were obtained and reviewed to evaluate the vascular anatomy.  Contrast: OMNIPAQUE IOHEXOL 300 MG/ML IV SOLN  Comparison: 04/29/2011 chest x-ray  Findings: Limited contrast opacification of the pulmonary arteries. Within the limits of the study, there is no large central or proximal hilar pulmonary embolus demonstrated.  Difficult to exclude smaller segmental or subsegmental pulmonary  emboli.  Mild atherosclerosis of the thoracic aorta.  Negative for aneurysm or dissection.  Normal heart size.  No pericardial or pleural effusion.  No hiatal hernia.  Mildly prominent subcarinal lymph nodes, likely reactive.  No definite adenopathy.  Retained secretions present in the lower trachea, images 32-39.  No acute finding in the upper abdomen imaged.  Thoracic spondylosis and degenerative changes noted.  Lung windows demonstrate mild hyperinflation.  Motion artifact in the lower lobes.  No focal pneumonia, collapse,  consolidation, airspace process, pneumonia, or interstitial changes.  IMPRESSION: Limited exam but no large central or proximal hilar pulmonary embolus.  Lower trachea retained secretions.  No acute intrathoracic process, pneumonia or CHF  Original Report Authenticated By: Judie Petit. Ruel Favors, M.D.   Ct Abdomen Pelvis W Contrast  05/06/2011  *RADIOLOGY REPORT*  Clinical Data: Suspected epigastric hernia  CT ABDOMEN AND PELVIS WITH CONTRAST  Technique:  Multidetector CT imaging of the abdomen and pelvis was performed following the standard protocol during bolus administration of intravenous contrast.  Contrast: OMNIPAQUE IOHEXOL 300 MG/ML IV SOLN  Comparison: 10/19/2009  Findings: Dependent atelectasis at the lung bases.  Liver, spleen, pancreas, and adrenal glands are within normal limits.  Cholelithiasis.  No associated inflammatory changes.  Kidneys are notable for a tiny left renal cysts.  No hydronephrosis.  No evidence of bowel obstruction.  Normal appendix.  Colonic diverticulosis, without associated inflammatory changes.  Atherosclerotic calcifications of the abdominal aorta and branch vessels.  No abdominopelvic ascites.  No suspicious abdominopelvic lymphadenopathy.  Prostatomegaly, measuring 5.7 cm in transverse dimension.  Bladder is within normal limits.  No evidence of ventral hernia.  Tiny fat-containing left femoral and inguinal hernias (series 2/images 78 and 82).  Asymmetric enlargement of the right rectus abdominus muscle with suspected 2.5 x 3.3 cm focus of hemorrhage inferiorly (series 2/image 57).  Degenerative changes of the visualized thoracolumbar spine.  IMPRESSION: No evidence of ventral hernia.  Asymmetric enlargement of the right rectus abdominus muscle with suspected intramuscular hematoma.  Additional ancillary findings as above.  Original Report Authenticated By: Charline Bills, M.D.   Dg Chest Port 1 View  04/29/2011  *RADIOLOGY REPORT*  Clinical Data: CHF, shortness of  breath, hypertension  PORTABLE CHEST - 1 VIEW  Comparison: 05/28/2007  Findings: Normal heart size and vascularity.  Monitor leads and external tubing overlie the chest.  No focal airspace disease, collapse, consolidation, large effusion or pneumothorax.  Trachea midline.  Degenerative thoracic spine.   Prominent lower lobe vascular markings.  Lungs are mildly hyperinflated.  IMPRESSION: Mild hyperinflation.  No acute chest process.  Original Report Authenticated By: Judie Petit. TREVOR Miles Costain, M.D.    Scheduled Meds:    . albuterol  2.5 mg Nebulization BID  . amLODipine  10 mg Oral Daily  . aspirin EC  81-162 mg Oral Daily  . atenolol  25 mg Oral Daily  . docusate sodium  100 mg Oral BID  . finasteride  5 mg Oral Daily  . Fluticasone-Salmeterol  1 puff Inhalation BID  . guaiFENesin  1,200 mg Oral BID  . magnesium citrate  1 Bottle Oral Once  . nicotine  7 mg Transdermal Daily  . nystatin  10 mL Oral QID  . pantoprazole  40 mg Oral Q1200  . polyethylene glycol  17 g Oral BID  . sodium chloride  3 mL Intravenous Q12H  . tiotropium  18 mcg Inhalation Daily  . DISCONTD: albuterol  2.5 mg Nebulization QID  . DISCONTD: ipratropium  0.5 mg  Nebulization QID   Continuous Infusions:    Assessment/Plan: 1. Influenza A: Clinically resolved. Completed Tamiflu. 2. Hypoxia: Oxygenation 84% on room air with ambulation. Home oxygen. Secondary to deconditioning. 3 L per minute nasal cannula continuous. 3. Leukocytosis: Possibly related to steroids. No evidence of acute infection. 4. Rectus sheath hematoma: Stable. Secondary to coughing. Per general surgery no intervention warranted unless hematoma becomes infected. 5. Constipation: Resolved. Continue bowel regimen as an outpatient. 6. Hypertension: Controlled. 7. Possible obstructive sleep apnea: Consider outpatient sleep study. 8. Alcohol abuse: Recommend cessation. 9. Possible subclinical COPD: Followup as an outpatient.   Code Status: Full  code. Family Communication: Disposition Plan: Home today. Apparently patient refuses home health.   Brendia Sacks, MD  [facility] Pager [phone] 05/10/2011, 11:19 AM    LOS: 11 days

## 2011-05-10 NOTE — Progress Notes (Signed)
Talked to patient about DCP; patient plans to return home at discharge, refused any HHC services and rolling walker; Patient is agreeable to home oxygen ( room air sats 86% per nursing staff). Once order is written for home 02, CM will fax orders off to [facility] for home 02 arrangements. Abelino Derrick RN, BSN, MHA

## 2011-06-20 ENCOUNTER — Emergency Department (HOSPITAL_COMMUNITY)
Admission: EM | Admit: 2011-06-20 | Discharge: 2011-06-20 | Disposition: A | Discharge status: Home / Self Care | Payer: Medicare HMO | Attending: Emergency Medicine | Admitting: Emergency Medicine

## 2011-06-20 ENCOUNTER — Encounter (HOSPITAL_COMMUNITY): Payer: Self-pay | Admitting: Emergency Medicine

## 2011-06-20 DIAGNOSIS — F3289 Other specified depressive episodes: Secondary | ICD-10-CM | POA: Insufficient documentation

## 2011-06-20 DIAGNOSIS — N400 Enlarged prostate without lower urinary tract symptoms: Secondary | ICD-10-CM | POA: Insufficient documentation

## 2011-06-20 DIAGNOSIS — Z7982 Long term (current) use of aspirin: Secondary | ICD-10-CM | POA: Insufficient documentation

## 2011-06-20 DIAGNOSIS — K219 Gastro-esophageal reflux disease without esophagitis: Secondary | ICD-10-CM | POA: Insufficient documentation

## 2011-06-20 DIAGNOSIS — M542 Cervicalgia: Secondary | ICD-10-CM | POA: Insufficient documentation

## 2011-06-20 DIAGNOSIS — F329 Major depressive disorder, single episode, unspecified: Secondary | ICD-10-CM | POA: Insufficient documentation

## 2011-06-20 DIAGNOSIS — F411 Generalized anxiety disorder: Secondary | ICD-10-CM | POA: Insufficient documentation

## 2011-06-20 DIAGNOSIS — M791 Myalgia, unspecified site: Secondary | ICD-10-CM

## 2011-06-20 DIAGNOSIS — I10 Essential (primary) hypertension: Secondary | ICD-10-CM | POA: Insufficient documentation

## 2011-06-20 DIAGNOSIS — E785 Hyperlipidemia, unspecified: Secondary | ICD-10-CM | POA: Insufficient documentation

## 2011-06-20 DIAGNOSIS — Z9861 Coronary angioplasty status: Secondary | ICD-10-CM | POA: Insufficient documentation

## 2011-06-20 MED ORDER — HYDROCODONE-ACETAMINOPHEN 5-325 MG PO TABS: 1.0000 | ORAL_TABLET | ORAL | Status: AC | PRN

## 2011-06-20 MED ORDER — CYCLOBENZAPRINE HCL 10 MG PO TABS
5.0000 mg | ORAL_TABLET | Freq: Once | ORAL | Status: AC
  Administered 2011-06-20: 5 mg via ORAL
  Filled 2011-06-20: qty 2

## 2011-06-20 MED ORDER — HYDROCODONE-ACETAMINOPHEN 5-325 MG PO TABS
1.0000 | ORAL_TABLET | Freq: Once | ORAL | Status: AC
  Administered 2011-06-20: 1 via ORAL
  Filled 2011-06-20: qty 1

## 2011-06-20 MED ORDER — CYCLOBENZAPRINE HCL 5 MG PO TABS: 5.0000 mg | ORAL_TABLET | Freq: Two times a day (BID) | ORAL | Status: AC | PRN

## 2011-06-20 NOTE — ED Provider Notes (Signed)
No CP SOB n/v/d.  No f/c/r.  No cough, no sore throat.  No red flags.  Positional MSK pain  NCAT FROM of the neck with pain, supple RRR CTAB  Nabs  PAIN MEDICATION AND CLOSE FOLLOW UP  Abhijot Straughter K Mckoy Bhakta-Rasch, MD 06/20/11 [phone]

## 2011-06-20 NOTE — Discharge Instructions (Signed)
ALTERNATE WARM WITH COOL COMPRESSES TO THE NECK. TAKE MEDICATIONS AS PRESCRIBED. RETURN HERE WITH ANY FEVER, VOMITING, DIZZINESS OR NEW CONCERN.  Muscle Strain A muscle strain, or pulled muscle, occurs when a muscle is over-stretched. A small number of muscle fibers may also be torn. This is especially common in athletes. This happens when a sudden violent force placed on a muscle pushes it past its capacity. Usually, recovery from a pulled muscle takes 1 to 2 weeks. But complete healing will take 5 to 6 weeks. There are millions of muscle fibers. Following injury, your body will usually return to normal quickly. HOME CARE INSTRUCTIONS   While awake, apply ice to the sore muscle for 15 to 20 minutes each hour for the first 2 days. Put ice in a plastic bag and place a towel between the bag of ice and your skin.   Do not use the pulled muscle for several days. Do not use the muscle if you have pain.   You may wrap the injured area with an elastic bandage for comfort. Be careful not to bind it too tightly. This may interfere with blood circulation.   Only take over-the-counter or prescription medicines for pain, discomfort, or fever as directed by your caregiver. Do not use aspirin as this will increase bleeding (bruising) at injury site.   Warming up before exercise helps prevent muscle strains.  SEEK MEDICAL CARE IF:  There is increased pain or swelling in the affected area. MAKE SURE YOU:   Understand these instructions.   Will watch your condition.   Will get help right away if you are not doing well or get worse.  Document Released: 04/02/2005 Document Revised: 03/22/2011 Document Reviewed: 10/30/2006 [facility] Patient Information 2012 [place], [place].

## 2011-06-20 NOTE — ED Provider Notes (Signed)
History     CSN: [id number]  Arrival date & time 06/20/11  0518   First MD Initiated Contact with Patient 06/20/11 [phone]      Chief Complaint  Patient presents with  . Neck Pain    (Consider location/radiation/quality/duration/timing/severity/associated sxs/prior treatment) Patient is a 71 y.o. male presenting with neck pain. The history is provided by the patient.  Neck Pain  This is a new problem. The current episode started more than 2 days ago. The problem occurs constantly. The problem has not changed since onset.There has been no fever. The pain is present in the occipital region. The pain is moderate. The symptoms are aggravated by position. The pain is the same all the time. Pertinent negatives include no photophobia, no visual change, no syncope, no headaches, no tingling and no weakness. Associated symptoms comments: He reports similar symptoms in the past off and on. To relieve the discomfort, he rests head in certain positions. No known injury. . He has tried neck support for the symptoms. The treatment provided mild relief.    Past Medical History  Diagnosis Date  . History of PTCA   . History of cardiac cath   . Hypertension   . Hyperlipidemia   . Depression   . BPH (benign prostatic hypertrophy)   . Shortness of breath   . Anxiety   . GERD (gastroesophageal reflux disease)     Past Surgical History  Procedure Date  . Cataract extraction   . Ganglion cyst removed   . Cardiac catheterization 05/11/2005    PCI OF LAD    Family History  Problem Relation Age of Onset  . Heart failure Mother   . Heart failure Brother     History  Substance Use Topics  . Smoking status: Current Some Day Smoker    Types: Pipe  . Smokeless tobacco: Never Used  . Alcohol Use: 3.5 oz/week    7 drink(s) per week      Review of Systems  Constitutional: Negative for fever and chills.  HENT: Positive for neck pain.   Eyes: Negative for photophobia.  Respiratory: Negative.     Cardiovascular: Negative.  Negative for syncope.  Gastrointestinal: Negative.   Musculoskeletal:       See HPI  Skin: Negative.   Neurological: Negative.  Negative for tingling, weakness and headaches.    Allergies  Review of patient's allergies indicates no known allergies.  Home Medications   Current Outpatient Rx  Name Route Sig Dispense Refill  . ALBUTEROL SULFATE HFA 108 (90 BASE) MCG/ACT IN AERS Inhalation Inhale 2 puffs into the lungs every 4 (four) hours as needed for wheezing or shortness of breath. 1 Inhaler 0  . ASPIRIN EC 81 MG PO TBEC Oral Take 162 mg by mouth daily.     . ATENOLOL 25 MG PO TABS Oral Take 25 mg by mouth daily.    Marland Kitchen FINASTERIDE 5 MG PO TABS Oral Take 5 mg by mouth daily.    Maximino Greenland 18-103 MCG/ACT IN AERO Inhalation Inhale 2 puffs into the lungs 4 (four) times daily. 1 Inhaler 0    BP 148/91  Pulse 55  Temp 98 F (36.7 C)  Resp 16  SpO2 96%  Physical Exam  Constitutional: He is oriented to person, place, and time. He appears well-developed and well-nourished.  Neck: Normal range of motion.       FROM of neck where patient reports pain with movement. Tender bilaterally from occipital region to base of neck. No  redness, swelling.   Pulmonary/Chest: Effort normal.  Musculoskeletal: Normal range of motion.  Neurological: He is alert and oriented to person, place, and time.  Skin: Skin is warm and dry.  Psychiatric: He has a normal mood and affect.    ED Course  Procedures (including critical care time  Pain improved with medications. Dr. Nicanor Alcon in to co-evaluate.  Labs Reviewed - No data to display No results found.   No diagnosis found.    MDM   Pain with movement, better with rest. No symptoms of illness or dizziness to cause suspicion for diagnoses other than muscular soreness.       Rodena Medin, PA-C 06/20/11 [phone]

## 2011-06-20 NOTE — ED Notes (Signed)
Pt alert, nad, c/o stiff neck, onset a few days ago, denies trauma or injury, ambulates to triage, resp even unlabored, skin pwd

## 2011-12-27 ENCOUNTER — Other Ambulatory Visit (HOSPITAL_COMMUNITY): Payer: Self-pay | Admitting: Urology

## 2011-12-27 DIAGNOSIS — R599 Enlarged lymph nodes, unspecified: Secondary | ICD-10-CM

## 2012-01-08 ENCOUNTER — Encounter (HOSPITAL_COMMUNITY)
Admission: RE | Admit: 2012-01-08 | Discharge: 2012-01-08 | Disposition: A | Discharge status: Home / Self Care | Payer: Medicare HMO | Source: Ambulatory Visit | Attending: Urology | Admitting: Urology

## 2012-01-08 DIAGNOSIS — K5730 Diverticulosis of large intestine without perforation or abscess without bleeding: Secondary | ICD-10-CM | POA: Insufficient documentation

## 2012-01-08 DIAGNOSIS — N400 Enlarged prostate without lower urinary tract symptoms: Secondary | ICD-10-CM | POA: Insufficient documentation

## 2012-01-08 DIAGNOSIS — R599 Enlarged lymph nodes, unspecified: Secondary | ICD-10-CM | POA: Insufficient documentation

## 2012-01-08 DIAGNOSIS — K8020 Calculus of gallbladder without cholecystitis without obstruction: Secondary | ICD-10-CM | POA: Insufficient documentation

## 2012-01-08 DIAGNOSIS — I714 Abdominal aortic aneurysm, without rupture, unspecified: Secondary | ICD-10-CM | POA: Insufficient documentation

## 2012-01-08 LAB — GLUCOSE, CAPILLARY: Glucose-Capillary: 100 mg/dL — ABNORMAL HIGH (ref 70–99)

## 2012-01-08 MED ORDER — FLUDEOXYGLUCOSE F - 18 (FDG) INJECTION
15.2000 | Freq: Once | INTRAVENOUS | Status: AC | PRN
  Administered 2012-01-08: 15.2 via INTRAVENOUS

## 2012-01-15 DIAGNOSIS — I209 Angina pectoris, unspecified: Secondary | ICD-10-CM

## 2012-01-15 HISTORY — DX: Angina pectoris, unspecified: I20.9

## 2012-01-22 ENCOUNTER — Inpatient Hospital Stay (HOSPITAL_COMMUNITY)
Admission: EM | Admit: 2012-01-22 | Discharge: 2012-01-25 | DRG: 247 | Disposition: A | Discharge status: Home / Self Care | Payer: Medicare HMO | Attending: Internal Medicine | Admitting: Internal Medicine

## 2012-01-22 ENCOUNTER — Emergency Department (HOSPITAL_COMMUNITY): Payer: Medicare HMO

## 2012-01-22 ENCOUNTER — Encounter (HOSPITAL_COMMUNITY): Payer: Self-pay | Admitting: *Deleted

## 2012-01-22 DIAGNOSIS — E785 Hyperlipidemia, unspecified: Secondary | ICD-10-CM | POA: Diagnosis present

## 2012-01-22 DIAGNOSIS — I2510 Atherosclerotic heart disease of native coronary artery without angina pectoris: Secondary | ICD-10-CM | POA: Diagnosis present

## 2012-01-22 DIAGNOSIS — Z955 Presence of coronary angioplasty implant and graft: Secondary | ICD-10-CM

## 2012-01-22 DIAGNOSIS — F411 Generalized anxiety disorder: Secondary | ICD-10-CM | POA: Diagnosis present

## 2012-01-22 DIAGNOSIS — G894 Chronic pain syndrome: Secondary | ICD-10-CM | POA: Diagnosis present

## 2012-01-22 DIAGNOSIS — I25119 Atherosclerotic heart disease of native coronary artery with unspecified angina pectoris: Secondary | ICD-10-CM | POA: Diagnosis present

## 2012-01-22 DIAGNOSIS — R0902 Hypoxemia: Secondary | ICD-10-CM | POA: Diagnosis present

## 2012-01-22 DIAGNOSIS — K219 Gastro-esophageal reflux disease without esophagitis: Secondary | ICD-10-CM | POA: Diagnosis present

## 2012-01-22 DIAGNOSIS — I10 Essential (primary) hypertension: Secondary | ICD-10-CM | POA: Diagnosis present

## 2012-01-22 DIAGNOSIS — R079 Chest pain, unspecified: Secondary | ICD-10-CM

## 2012-01-22 DIAGNOSIS — I219 Acute myocardial infarction, unspecified: Secondary | ICD-10-CM

## 2012-01-22 DIAGNOSIS — F17200 Nicotine dependence, unspecified, uncomplicated: Secondary | ICD-10-CM | POA: Diagnosis present

## 2012-01-22 DIAGNOSIS — Z9861 Coronary angioplasty status: Secondary | ICD-10-CM

## 2012-01-22 DIAGNOSIS — K8020 Calculus of gallbladder without cholecystitis without obstruction: Secondary | ICD-10-CM | POA: Diagnosis present

## 2012-01-22 DIAGNOSIS — R0602 Shortness of breath: Secondary | ICD-10-CM | POA: Diagnosis present

## 2012-01-22 DIAGNOSIS — F3289 Other specified depressive episodes: Secondary | ICD-10-CM | POA: Diagnosis present

## 2012-01-22 DIAGNOSIS — F329 Major depressive disorder, single episode, unspecified: Secondary | ICD-10-CM | POA: Diagnosis present

## 2012-01-22 DIAGNOSIS — N400 Enlarged prostate without lower urinary tract symptoms: Secondary | ICD-10-CM | POA: Diagnosis present

## 2012-01-22 DIAGNOSIS — Z7982 Long term (current) use of aspirin: Secondary | ICD-10-CM

## 2012-01-22 DIAGNOSIS — I214 Non-ST elevation (NSTEMI) myocardial infarction: Principal | ICD-10-CM | POA: Diagnosis present

## 2012-01-22 DIAGNOSIS — D696 Thrombocytopenia, unspecified: Secondary | ICD-10-CM | POA: Diagnosis not present

## 2012-01-22 DIAGNOSIS — Z79899 Other long term (current) drug therapy: Secondary | ICD-10-CM

## 2012-01-22 HISTORY — DX: Acute myocardial infarction, unspecified: I21.9

## 2012-01-22 LAB — CBC WITH DIFFERENTIAL/PLATELET
Basophils Absolute: 0.1 10*3/uL (ref 0.0–0.1)
Basophils Relative: 1 % (ref 0–1)
Eosinophils Relative: 3 % (ref 0–5)
HCT: 44.6 % (ref 39.0–52.0)
Hemoglobin: 15.2 g/dL (ref 13.0–17.0)
MCH: 29.5 pg (ref 26.0–34.0)
MCHC: 34.1 g/dL (ref 30.0–36.0)
MCV: 86.6 fL (ref 78.0–100.0)
Monocytes Absolute: 1.2 10*3/uL — ABNORMAL HIGH (ref 0.1–1.0)
Monocytes Relative: 13 % — ABNORMAL HIGH (ref 3–12)
Neutro Abs: 5.3 10*3/uL (ref 1.7–7.7)
RDW: 13.7 % (ref 11.5–15.5)

## 2012-01-22 LAB — COMPREHENSIVE METABOLIC PANEL
ALT: 15 U/L (ref 0–53)
AST: 18 U/L (ref 0–37)
Albumin: 3.3 g/dL — ABNORMAL LOW (ref 3.5–5.2)
Alkaline Phosphatase: 59 U/L (ref 39–117)
Calcium: 9 mg/dL (ref 8.4–10.5)
Glucose, Bld: 131 mg/dL — ABNORMAL HIGH (ref 70–99)
Potassium: 3.8 mEq/L (ref 3.5–5.1)
Sodium: 139 mEq/L (ref 135–145)
Total Protein: 5.9 g/dL — ABNORMAL LOW (ref 6.0–8.3)

## 2012-01-22 LAB — POCT I-STAT TROPONIN I

## 2012-01-22 MED ORDER — ONDANSETRON HCL 4 MG/2ML IJ SOLN
INTRAMUSCULAR | Status: AC
  Administered 2012-01-22: 20:00:00
  Filled 2012-01-22: qty 2

## 2012-01-22 MED ORDER — NITROGLYCERIN IN D5W 200-5 MCG/ML-% IV SOLN
2.0000 ug/min | Freq: Once | INTRAVENOUS | Status: AC
  Administered 2012-01-22: 10 ug/min via INTRAVENOUS
  Filled 2012-01-22: qty 250

## 2012-01-22 MED ORDER — IOHEXOL 350 MG/ML SOLN
65.0000 mL | Freq: Once | INTRAVENOUS | Status: AC | PRN
  Administered 2012-01-22: 65 mL via INTRAVENOUS

## 2012-01-22 MED ORDER — HEPARIN (PORCINE) IN NACL 100-0.45 UNIT/ML-% IJ SOLN
1100.0000 [IU]/h | INTRAMUSCULAR | Status: DC
  Administered 2012-01-22 – 2012-01-23 (×2): 1100 [IU]/h via INTRAVENOUS
  Filled 2012-01-22: qty 250

## 2012-01-22 MED ORDER — MORPHINE SULFATE 4 MG/ML IJ SOLN
4.0000 mg | Freq: Once | INTRAMUSCULAR | Status: AC
  Administered 2012-01-22: 4 mg via INTRAVENOUS
  Filled 2012-01-22: qty 1

## 2012-01-22 MED ORDER — ASPIRIN 81 MG PO CHEW
324.0000 mg | CHEWABLE_TABLET | Freq: Once | ORAL | Status: AC
  Administered 2012-01-22: 324 mg via ORAL

## 2012-01-22 MED ORDER — ALBUTEROL SULFATE (5 MG/ML) 0.5% IN NEBU
5.0000 mg | INHALATION_SOLUTION | Freq: Once | RESPIRATORY_TRACT | Status: AC
  Administered 2012-01-22: 5 mg via RESPIRATORY_TRACT
  Filled 2012-01-22: qty 1

## 2012-01-22 MED ORDER — HEPARIN BOLUS VIA INFUSION
4000.0000 [IU] | Freq: Once | INTRAVENOUS | Status: AC
  Administered 2012-01-22: 4000 [IU] via INTRAVENOUS

## 2012-01-22 MED ORDER — ONDANSETRON HCL 4 MG/2ML IJ SOLN
INTRAMUSCULAR | Status: AC
  Administered 2012-01-22: 4 mg
  Filled 2012-01-22: qty 2

## 2012-01-22 NOTE — ED Notes (Signed)
C/o sob, chest heaviness, DOE. Onset 2d ago, here by EMS, ASA 324, ntg x3, zofran 4mg  given PTA, NSL in place, NSR on monitor. Alert, NAD, calm, interactive, resps even & mildly labored, pt of Dr. Verdis Prime.

## 2012-01-22 NOTE — H&P (Addendum)
Admit date: 01/22/2012 Referring Physician  Dr. Ethelda Chick Primary Cardiologist Dr. Verdis Prime Chief complaint/reason for admission:chest pain  HPI: This is a 71yo WM with a history of cutting balloon PCI of the LAD in 2007, HTN, dyslipidemia and GERD who was in his USOH until yesterday afternoon and took 2 bayer ASA which improved with pain.  He had recurrent pain last PM but went to bed and slept all night.  When he got up this am he has some mild discomfort in his chest but around 1PM he starting having severe pressure on his chest with radiation into the left arm and neck.  He call EMS and they gave him 2-3 SL NTG.  The NTG did improve his pain.  Currently he complains of pain in his neck and left arm.  The chest pain increases with deep breathing and movement of his left arm.  He says that this pain was the worst pain he has ever had but currently it is a 4/10 in intensity.  Of note he had some bleeding from his penis Sept 11th and saw Dr. 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  He has had severe abdominal pain in the RUQ for several days that is very tender to palpation.    PMH:    Past Medical History  Diagnosis Date  . History of PTCA   . History of cardiac cath   . Hypertension   . Hyperlipidemia   . Depression   . BPH (benign prostatic hypertrophy)   . Shortness of breath   . Anxiety   . GERD (gastroesophageal reflux disease)     PSH:    Past Surgical History  Procedure Date  . Cataract extraction   . Ganglion cyst removed   . Cardiac catheterization 05/11/2005    PCI OF LAD    ALLERGIES:   Review of patient's allergies indicates no known allergies.  Prior to Admit Meds:   (Not in a hospital admission) Family HX:    Family History  Problem Relation Age of Onset  . Heart failure Mother   . Heart failure Brother     Social HX:    History   Social History  . Marital Status: Single    Spouse Name: N/A    Number of Children: N/A  . Years of Education: N/A   Occupational History  . Not on file.   Social History Main Topics  . Smoking status: Current Some Day Smoker    Types: Pipe  . Smokeless tobacco: Never Used  . Alcohol Use: 3.5 oz/week    7 drink(s) per week  . Drug Use: No  . Sexually Active: No   Other Topics Concern  . Not on file   Social History Narrative  . No narrative on file     ROS:  All 11 ROS were addressed and are negative except what is stated in the HPI  PHYSICAL EXAM Filed Vitals:   01/22/12 2243  BP: 121/80  Pulse:   Temp:   Resp: 13   General: Well developed, well nourished, in no acute distress Head: Eyes PERRLA, No xanthomas.   Normal cephalic and atramatic  Lungs:   Clear bilaterally to auscultation and percussion. Heart:   HRRR S1 S2 Pulses are 2+ & equal.            No carotid bruit.  No JVD.  No abdominal bruits. No femoral bruits. Abdomen: Bowel sounds are positive, abdomen soft and tender to palpation over RUQ without masses  Extremities:   No clubbing, cyanosis or edema.  DP +1 Neuro: Alert and oriented X 3. Psych:  Good affect, responds appropriately   Labs:   Lab Results  Component Value Date   WBC 8.6 01/22/2012   HGB 15.2 01/22/2012   HCT 44.6 01/22/2012   MCV 86.6 01/22/2012   PLT 152 01/22/2012    Lab 01/22/12 1939  NA 139  K 3.8  CL 105  CO2 25  BUN 22  CREATININE 1.12  CALCIUM 9.0  PROT 5.9*  BILITOT 0.2*  ALKPHOS 59  ALT 15  AST 18  GLUCOSE 131*   Lab Results  Component Value Date   CKTOTAL 219 04/30/2011   CKMB 3.8 04/30/2011   TROPONINI <0.30 04/30/2011   No results found for this basename: PTT   No results found for this basename: INR, PROTIME     Lab Results  Component Value Date   CHOL 187 05/01/2011   Lab Results  Component Value Date   HDL 59 05/01/2011   Lab Results  Component Value Date   LDLCALC  113* 05/01/2011   Lab Results  Component Value Date   TRIG 76 05/01/2011   Lab Results  Component Value Date   CHOLHDL 3.2 05/01/2011   No results found for this basename: LDLDIRECT      Radiology: *RADIOLOGY REPORT*  Clinical Data: Shortness of breath, chest pressure  CHEST - 1 VIEW  Comparison: 04/29/2011  Findings: The heart size and mediastinal contours are within normal  limits. Both lungs are clear.  IMPRESSION:  No active disease.  Original Report Authenticated By: Judie Petit. Ruel Favors, M.D.    EKG:  NSR with nonspecific T wave abnormality in V1 and V2  ASSESSMENT:  1.  Chest pain with some atypical components.  Initial troponin is elevated. 2.  CAD with history of cutting balloon angioplasty with stent in 2007 3.  Anxiety/depression 4. GERD 5.  Abdominal pain  PLAN:   1.  Admit to stepdown tele 2.  Cycle cardiac enzymes 3.  IV Heparin gtt per pharmacy 4.  IV NTG gtt 5.  ASA/statin/beta blocker 6.  NPO after midnight 7.  Probable cath in am 8.  Consider GI evaluation for cholelithiasis by CT scan with severe pain with palpation of RUQ  Quintella Reichert, MD  01/22/2012  11:15 PM

## 2012-01-22 NOTE — ED Notes (Signed)
Cardiology in to see pt.  Pt remains in ed to see hospitalist for admission.  Nad.

## 2012-01-22 NOTE — ED Provider Notes (Signed)
History     CSN: [id number]  Arrival date & time 01/22/12  1925   First MD Initiated Contact with Patient 01/22/12 1944      Chief Complaint  Patient presents with  . Chest Pain  . Shortness of Breath    (Consider location/radiation/quality/duration/timing/severity/associated sxs/prior treatment) HPI Complains of left-sided chest pain and left arm pain, pleuritic in nature worse with moving his arm accompanied by shortness of breath onset yesterday afternoon. Treated by EMS today with aspirin and 3 sublingual nitroglycerin and Zofran IV with partial relief. Pain is improved with remaining still associated symptoms include shortness of breath no nausea no sweatiness Past Medical History  Diagnosis Date  . History of PTCA   . History of cardiac cath   . Hypertension   . Hyperlipidemia   . Depression   . BPH (benign prostatic hypertrophy)   . Shortness of breath   . Anxiety   . 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 Cataract extraction   . Ganglion cyst removed   . Cardiac catheterization 05/11/2005    PCI OF LAD    Family History  Problem Relation Age of Onset  . Heart failure Mother   . Heart failure Brother     History  Substance Use Topics  . Smoking status: Current Some Day Smoker    Types: Pipe  . Smokeless tobacco: Never Used  . Alcohol Use: 3.5 oz/week    7 drink(s) per week      Review of Systems  Constitutional: Negative.   HENT: Negative.   Respiratory: Positive for shortness of breath.   Cardiovascular: Positive for chest pain.  Gastrointestinal: Positive for nausea.  Musculoskeletal: Negative.   Skin: Negative.   Neurological: Negative.   Hematological: Negative.   Psychiatric/Behavioral: Negative.     Allergies  Review of patient's allergies indicates no known  allergies.  Home Medications   Current Outpatient Rx  Name Route Sig Dispense Refill  . ALBUTEROL SULFATE HFA 108 (90 BASE) MCG/ACT IN AERS Inhalation Inhale 2 puffs into the lungs every 4 (four) hours as needed for wheezing or shortness of breath. 1 Inhaler 0  . IPRATROPIUM-ALBUTEROL 18-103 MCG/ACT IN AERO Inhalation Inhale 2 puffs into the lungs 4 (four) times daily. 1 Inhaler 0  . ASPIRIN EC 81 MG PO TBEC Oral Take 162 mg by mouth daily.     . ATENOLOL 25 MG PO TABS Oral Take 25 mg by mouth daily.    Marland Kitchen FINASTERIDE 5 MG PO TABS Oral Take 5 mg by mouth daily.      BP 146/85  Temp 98.1 F (36.7 C) (Oral)  Resp 12  SpO2 97%  Physical Exam  Nursing note and vitals reviewed. Constitutional: He appears well-developed and well-nourished. He appears distressed.       Appears uncomfortable  HENT:  Head: Normocephalic and atraumatic.  Eyes: Conjunctivae normal are normal. Pupils are equal, round, and reactive to light.  Neck: Neck supple. No tracheal deviation present. No thyromegaly present.  Cardiovascular: Normal rate and regular rhythm.   No murmur heard. Pulmonary/Chest: Effort normal and breath sounds normal.  Abdominal: Soft. Bowel sounds are normal. He exhibits no distension. There is no tenderness.  Musculoskeletal: Normal range of motion. He exhibits no edema and no tenderness.  Neurological: He is alert. Coordination normal.  Skin: Skin is warm  and dry. No rash noted.  Psychiatric: He has a normal mood and affect.    ED Course  Procedures (including critical care time) 8:25 PM patient reports nausea and pain not improved after treatment with morphine . Intravenous nitroglycerin drip ordered  Labs Reviewed  COMPREHENSIVE METABOLIC PANEL  CBC WITH DIFFERENTIAL  PRO B NATRIURETIC PEPTIDE   No results found.  Date: 01/22/2012  Rate: 65  Rhythm: normal sinus rhythm  QRS Axis: normal  Intervals: normal  ST/T Wave abnormalities: normal  Conduction Disutrbances:none   Narrative Interpretation:   Old EKG Reviewed: unchanged no significant change from Apr 29 2011 as interpreted by me Results for orders placed during the hospital encounter of 01/22/12  COMPREHENSIVE METABOLIC PANEL      Component Value Range   Sodium 139  135 - 145 mEq/L   Potassium 3.8  3.5 - 5.1 mEq/L   Chloride 105  96 - 112 mEq/L   CO2 25  19 - 32 mEq/L   Glucose, Bld 131 (*) 70 - 99 mg/dL   BUN 22  6 - 23 mg/dL   Creatinine, Ser 5.28  0.50 - 1.35 mg/dL   Calcium 9.0  8.4 - 41.3 mg/dL   Total Protein 5.9 (*) 6.0 - 8.3 g/dL   Albumin 3.3 (*) 3.5 - 5.2 g/dL   AST 18  0 - 37 U/L   ALT 15  0 - 53 U/L   Alkaline Phosphatase 59  39 - 117 U/L   Total Bilirubin 0.2 (*) 0.3 - 1.2 mg/dL   GFR calc non Af Amer 64 (*) >90 mL/min   GFR calc Af Amer 74 (*) >90 mL/min  CBC WITH DIFFERENTIAL      Component Value Range   WBC 8.6  4.0 - 10.5 K/uL   RBC 5.15  4.22 - 5.81 MIL/uL   Hemoglobin 15.2  13.0 - 17.0 g/dL   HCT 24.4  01.0 - 27.2 %   MCV 86.6  78.0 - 100.0 fL   MCH 29.5  26.0 - 34.0 pg   MCHC 34.1  30.0 - 36.0 g/dL   RDW 53.6  64.4 - 03.4 %   Platelets 152  150 - 400 K/uL   Neutrophils Relative 61  43 - 77 %   Neutro Abs 5.3  1.7 - 7.7 K/uL   Lymphocytes Relative 22  12 - 46 %   Lymphs Abs 1.9  0.7 - 4.0 K/uL   Monocytes Relative 13 (*) 3 - 12 %   Monocytes Absolute 1.2 (*) 0.1 - 1.0 K/uL   Eosinophils Relative 3  0 - 5 %   Eosinophils Absolute 0.3  0.0 - 0.7 K/uL   Basophils Relative 1  0 - 1 %   Basophils Absolute 0.1  0.0 - 0.1 K/uL  PRO B NATRIURETIC PEPTIDE      Component Value Range   Pro B Natriuretic peptide (BNP) 162.4 (*) 0 - 125 pg/mL  POCT I-STAT TROPONIN I      Component Value Range   Troponin i, poc 0.18 (*) 0.00 - 0.08 ng/mL   Comment NOTIFIED PHYSICIAN     Comment 3           LIPASE, BLOOD      Component Value Range   Lipase 28  11 - 59 U/L   Ct Angio Chest Pe W/cm &/or Wo Cm  01/22/2012  *RADIOLOGY REPORT*  Clinical Data: 71 year old male with shortness  of breath and chest pain.  CT ANGIOGRAPHY CHEST  Technique:  Multidetector CT imaging of the chest using the standard protocol during bolus administration of intravenous contrast. Multiplanar reconstructed images including MIPs were obtained and reviewed to evaluate the vascular anatomy.  Contrast: 65mL OMNIPAQUE IOHEXOL 350 MG/ML SOLN  Comparison: 04/29/2011.  Findings: Suboptimal contrast bolus timing in the pulmonary arterial tree.  Fortunately there is very little respiratory motion. No focal filling defect identified in the pulmonary arterial tree to suggest the presence of acute pulmonary embolism.  Atelectatic changes to the major airways.  Increased dependent opacity compatible with atelectasis.  No other abnormal pulmonary opacity.  Negative thoracic inlet.  Mediastinal lipomatosis.  No mediastinal lymphadenopathy.  Coronary artery atherosclerosis.  No pericardial or pleural effusion.  Cholelithiasis.  Other visualized upper abdominal viscera are negative.  Chronic lower sternal fracture. No acute osseous abnormality identified.  IMPRESSION: 1. No evidence of acute pulmonary embolus. 2.  Pulmonary atelectasis.  Coronary artery atherosclerosis. Cholelithiasis.   Original Report Authenticated By: Harley Hallmark, M.D.    Nm Pet Image Initial (pi) Skull Base To Thigh  01/08/2012  *RADIOLOGY REPORT*  Clinical Data: Initial treatment strategy for enlarged pancreatic lymph node on CT.  NUCLEAR MEDICINE PET SKULL BASE TO THIGH  Fasting Blood Glucose:  100  Technique:  15.2 mCi F-18 FDG was injected intravenously. CT data was obtained and used for attenuation correction and anatomic localization only.  (This was not acquired as a diagnostic CT examination.) Additional exam technical data entered on technologist worksheet.  Comparison:  CT abdomen pelvis dated 05/06/2011.  CT chest dated 04/29/2011.  CT abdomen/pelvis dated 10/19/2009.  Findings:  Neck: No hypermetabolic lymph nodes in the neck.  Chest:  No  hypermetabolic mediastinal or hilar nodes.  No suspicious pulmonary nodules on the CT scan.  Abdomen/Pelvis:  2.2 x 2.3 cm non-FDG-avid soft tissue lesion in the mid abdomen (series 2/image 153).  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  It is stable versus minimally enlarged from 2011, when it measured 2.1 x 1.8 cm.  No abnormal hypermetabolic activity within the liver, pancreas, adrenal glands, or spleen.  No hypermetabolic lymph nodes in the abdomen or pelvis.  Cholelithiasis.  Colonic diverticulosis.  Prostatomegaly, measuring 6.1 cm in transverse dimension.  Bladder is thick-walled although underdistended.  Prior right inferior rectus hematoma has resolved.  Skeleton:  No focal hypermetabolic activity to suggest skeletal metastasis.  IMPRESSION: No findings specific for malignancy in the neck, chest, abdomen, or pelvis.  No suspicious lymphadenopathy.  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  This likely corresponds to the reported "enlarged pancreatic lymph node."  Additional stable ancillary findings as above.   Original Report Authenticated By: Charline Bills, M.D.    Dg Chest Portable 1 View  01/22/2012   *RADIOLOGY REPORT*  Clinical Data: Shortness of breath, chest pressure  CHEST - 1 VIEW  Comparison:  04/29/2011  Findings: The heart size and mediastinal contours are within normal limits.  Both lungs are clear.  IMPRESSION: No active disease.   Original Report Authenticated By: Judie Petit. Ruel Favors, M.D.      No diagnosis found.  10:40 PM pain improved after treatment with intravenous nitroglycerin. Heparin as per pharmacy protocol for acute coronary syndrome spoke with Dr. Mayford Knife who will patient emergency department for  admission  12:45 PM patient continues to improve patient reexamined. He is in no distress he is minimally tender at epigastrium and right upper quadrant MDM  Assessment symptoms atypical for acute coronary syndrome however with elevated troponin, shortness of breath and left arm pain feel it intravenous nitroglycerin, aspirin heparin are warranted. Dr. Mayford Knife requests that I call hospitalist physician for admission.  Dr. Onalee Hua called by me and will work with Dr. Mayford Knife to arrange for admission or 23  observation Diagnosis NSTEMI   CRITICAL CARE Performed by: Doug Sou   Total critical care time: 35 minute  Critical care time was exclusive of separately billable procedures and treating other patients.  Critical care was necessary to treat or prevent imminent or life-threatening deterioration.  Critical care was time spent personally by me on the following activities: development of treatment plan with patient and/or surrogate as well as nursing, discussions with consultants, evaluation of patient's response to treatment, examination of patient, obtaining history from patient or surrogate, ordering and performing treatments and interventions, ordering and review of laboratory studies, ordering and review of radiographic studies, pulse oximetry and re-evaluation of patient's condition.      Doug Sou, MD 01/23/12 [phone]

## 2012-01-22 NOTE — ED Notes (Signed)
Pt to CT, no changes, alert, calm, NAd.

## 2012-01-22 NOTE — ED Notes (Signed)
EDP at BS 

## 2012-01-22 NOTE — ED Notes (Signed)
EKG completed and given to Dr. Jacubowitz along with OLD ekg. 

## 2012-01-22 NOTE — Progress Notes (Signed)
\  ANTICOAGULATION CONSULT NOTE - Initial Consult  Pharmacy Consult for Heparin Indication: chest pain/ACS  No Known Allergies  Patient Measurements: Height: 6' 0.05" (183 cm) Weight: 198 lb 6.6 oz (90 kg) IBW/kg (Calculated) : 77.71   Vital Signs: Temp: 98.1 F (36.7 C) (10/08 1937) Temp src: Oral (10/08 1937) BP: 121/80 mmHg (10/08 2243) Pulse Rate: 63  (10/08 2130)  Labs:  Basename 01/22/12 1939  HGB 15.2  HCT 44.6  PLT 152  APTT --  LABPROT --  INR --  HEPARINUNFRC --  CREATININE 1.12  CKTOTAL --  CKMB --  TROPONINI --    Estimated Creatinine Clearance: 65.5 ml/min (by C-G formula based on Cr of 1.12).   Medical History: Past Medical History  Diagnosis Date  . History of PTCA   . History of cardiac cath   . Hypertension   . Hyperlipidemia   . Depression   . BPH (benign prostatic hypertrophy)   . Shortness of breath   . Anxiety   . GERD (gastroesophageal reflux disease)     Medications:  ASA  Atenolol  Proscar  Ntg  Assessment: 71 yo male with chest pain for Heparin  Goal of Therapy:  Heparin level 0.3-0.7 units/ml Monitor platelets by anticoagulation protocol: Yes   Plan:  Heparin 4000 units IV bolus, then 1100 units/hr Check heparin level in 6 hours.  Kiarra Kidd, Gary Fleet 01/22/2012,10:45 PM

## 2012-01-23 DIAGNOSIS — I25119 Atherosclerotic heart disease of native coronary artery with unspecified angina pectoris: Secondary | ICD-10-CM | POA: Diagnosis present

## 2012-01-23 DIAGNOSIS — I214 Non-ST elevation (NSTEMI) myocardial infarction: Secondary | ICD-10-CM | POA: Diagnosis present

## 2012-01-23 DIAGNOSIS — K8020 Calculus of gallbladder without cholecystitis without obstruction: Secondary | ICD-10-CM | POA: Diagnosis present

## 2012-01-23 DIAGNOSIS — I2510 Atherosclerotic heart disease of native coronary artery without angina pectoris: Secondary | ICD-10-CM | POA: Diagnosis present

## 2012-01-23 DIAGNOSIS — I10 Essential (primary) hypertension: Secondary | ICD-10-CM

## 2012-01-23 DIAGNOSIS — R079 Chest pain, unspecified: Secondary | ICD-10-CM

## 2012-01-23 DIAGNOSIS — R0602 Shortness of breath: Secondary | ICD-10-CM | POA: Insufficient documentation

## 2012-01-23 LAB — BASIC METABOLIC PANEL
Chloride: 107 mEq/L (ref 96–112)
Creatinine, Ser: 1 mg/dL (ref 0.50–1.35)
GFR calc Af Amer: 85 mL/min — ABNORMAL LOW (ref >90)
Potassium: 3.8 mEq/L (ref 3.5–5.1)
Sodium: 139 mEq/L (ref 135–145)

## 2012-01-23 LAB — LIPID PANEL
HDL: 51 mg/dL (ref >39)
LDL Cholesterol: 111 mg/dL — ABNORMAL HIGH (ref 0–99)
Triglycerides: 63 mg/dL (ref <150)

## 2012-01-23 LAB — CBC
HCT: 41.2 % (ref 39.0–52.0)
RDW: 13.8 % (ref 11.5–15.5)
WBC: 8.7 10*3/uL (ref 4.0–10.5)

## 2012-01-23 LAB — LIPASE, BLOOD: Lipase: 28 U/L (ref 11–59)

## 2012-01-23 LAB — PROTIME-INR
INR: 1.03 (ref 0.00–1.49)
Prothrombin Time: 13.4 seconds (ref 11.6–15.2)

## 2012-01-23 LAB — MRSA PCR SCREENING: MRSA by PCR: NEGATIVE

## 2012-01-23 MED ORDER — ATORVASTATIN CALCIUM 80 MG PO TABS
80.0000 mg | ORAL_TABLET | Freq: Every day | ORAL | Status: DC
  Administered 2012-01-23 – 2012-01-24 (×2): 80 mg via ORAL
  Filled 2012-01-23 (×3): qty 1

## 2012-01-23 MED ORDER — ASPIRIN EC 81 MG PO TBEC: 81.0000 mg | DELAYED_RELEASE_TABLET | Freq: Every day | ORAL | Status: DC

## 2012-01-23 MED ORDER — SODIUM CHLORIDE 0.9 % IJ SOLN: 3.0000 mL | INTRAMUSCULAR | Status: DC | PRN

## 2012-01-23 MED ORDER — FINASTERIDE 5 MG PO TABS
5.0000 mg | ORAL_TABLET | Freq: Every day | ORAL | Status: DC
  Administered 2012-01-23 – 2012-01-25 (×3): 5 mg via ORAL
  Filled 2012-01-23 (×3): qty 1

## 2012-01-23 MED ORDER — ASPIRIN 81 MG PO CHEW: 324.0000 mg | CHEWABLE_TABLET | ORAL | Status: DC

## 2012-01-23 MED ORDER — ACETAMINOPHEN 325 MG PO TABS
650.0000 mg | ORAL_TABLET | ORAL | Status: DC | PRN
  Administered 2012-01-23 – 2012-01-24 (×5): 650 mg via ORAL
  Filled 2012-01-23 (×4): qty 2

## 2012-01-23 MED ORDER — ONDANSETRON HCL 4 MG/2ML IJ SOLN
4.0000 mg | Freq: Four times a day (QID) | INTRAMUSCULAR | Status: DC | PRN
  Administered 2012-01-23: 4 mg via INTRAVENOUS
  Filled 2012-01-23: qty 2

## 2012-01-23 MED ORDER — SODIUM CHLORIDE 0.9 % IV SOLN
INTRAVENOUS | Status: DC
  Administered 2012-01-23: 150 mL/h via INTRAVENOUS
  Administered 2012-01-24: 10:00:00 via INTRAVENOUS

## 2012-01-23 MED ORDER — SODIUM CHLORIDE 0.9 % IV SOLN: INTRAVENOUS | Status: DC

## 2012-01-23 MED ORDER — SODIUM CHLORIDE 0.9 % IV SOLN: 250.0000 mL | INTRAVENOUS | Status: DC | PRN

## 2012-01-23 MED ORDER — ASPIRIN 81 MG PO CHEW
324.0000 mg | CHEWABLE_TABLET | ORAL | Status: AC
  Administered 2012-01-23: 324 mg via ORAL
  Filled 2012-01-23: qty 4

## 2012-01-23 MED ORDER — POLYVINYL ALCOHOL 1.4 % OP SOLN
2.0000 [drp] | OPHTHALMIC | Status: DC | PRN
  Administered 2012-01-23: 2 [drp] via OPHTHALMIC
  Filled 2012-01-23: qty 15

## 2012-01-23 MED ORDER — MORPHINE SULFATE 2 MG/ML IJ SOLN: 1.0000 mg | INTRAMUSCULAR | Status: DC | PRN

## 2012-01-23 MED ORDER — ASPIRIN EC 81 MG PO TBEC
81.0000 mg | DELAYED_RELEASE_TABLET | Freq: Every day | ORAL | Status: DC
  Administered 2012-01-23 – 2012-01-25 (×2): 81 mg via ORAL
  Filled 2012-01-23 (×3): qty 1

## 2012-01-23 MED ORDER — DIAZEPAM 5 MG PO TABS: 10.0000 mg | ORAL_TABLET | ORAL | Status: DC

## 2012-01-23 MED ORDER — ATENOLOL 25 MG PO TABS
25.0000 mg | ORAL_TABLET | Freq: Every morning | ORAL | Status: DC
  Administered 2012-01-23 – 2012-01-25 (×3): 25 mg via ORAL
  Filled 2012-01-23 (×4): qty 1

## 2012-01-23 MED ORDER — SODIUM CHLORIDE 0.9 % IJ SOLN: 3.0000 mL | Freq: Two times a day (BID) | INTRAMUSCULAR | Status: DC

## 2012-01-23 MED ORDER — DIAZEPAM 5 MG PO TABS
10.0000 mg | ORAL_TABLET | ORAL | Status: AC
  Administered 2012-01-24: 10 mg via ORAL
  Filled 2012-01-23: qty 2

## 2012-01-23 MED ORDER — SODIUM CHLORIDE 0.9 % IV SOLN
250.0000 mL | INTRAVENOUS | Status: DC | PRN
  Administered 2012-01-23: 250 mL via INTRAVENOUS

## 2012-01-23 MED ORDER — SODIUM CHLORIDE 0.9 % IJ SOLN
3.0000 mL | Freq: Two times a day (BID) | INTRAMUSCULAR | Status: DC
  Administered 2012-01-23 (×2): 3 mL via INTRAVENOUS

## 2012-01-23 MED ORDER — ASPIRIN 300 MG RE SUPP
300.0000 mg | RECTAL | Status: AC
  Filled 2012-01-23: qty 1

## 2012-01-23 MED ORDER — ASPIRIN 81 MG PO CHEW
324.0000 mg | CHEWABLE_TABLET | ORAL | Status: AC
  Administered 2012-01-24: 324 mg via ORAL
  Filled 2012-01-23: qty 4

## 2012-01-23 MED ORDER — ATORVASTATIN CALCIUM 10 MG PO TABS
10.0000 mg | ORAL_TABLET | Freq: Every day | ORAL | Status: DC
  Filled 2012-01-23: qty 1

## 2012-01-23 MED ORDER — NITROGLYCERIN IN D5W 200-5 MCG/ML-% IV SOLN
3.0000 ug/min | INTRAVENOUS | Status: DC
  Administered 2012-01-23: 20 ug/min via INTRAVENOUS
  Administered 2012-01-24: 15 ug/min via INTRAVENOUS
  Filled 2012-01-23: qty 250

## 2012-01-23 MED ORDER — CYCLOBENZAPRINE HCL 10 MG PO TABS
5.0000 mg | ORAL_TABLET | Freq: Three times a day (TID) | ORAL | Status: DC | PRN
  Filled 2012-01-23: qty 1

## 2012-01-23 MED ORDER — BIOTENE DRY MOUTH MT LIQD
15.0000 mL | Freq: Two times a day (BID) | OROMUCOSAL | Status: DC
  Administered 2012-01-23 (×2): 15 mL via OROMUCOSAL

## 2012-01-23 MED ORDER — ACETAMINOPHEN 325 MG PO TABS
650.0000 mg | ORAL_TABLET | Freq: Once | ORAL | Status: AC
  Administered 2012-01-23: 650 mg via ORAL
  Filled 2012-01-23: qty 2

## 2012-01-23 NOTE — Progress Notes (Signed)
CRITICAL VALUE ALERT  Critical value received:  Troponin 4.51   Date of notification:  01/23/12  Time of notification:  1005  Critical value read back:yes  Nurse who received alert:  C.Viriginia Amendola  MD notified (1st page):  Dr.HenrySmith III   Time of first page:  1005  MD notified (2nd page):  Time of second page:  Responding MD:  Dr.Smith   Time MD responded:  1020

## 2012-01-23 NOTE — Progress Notes (Signed)
Dr. Mayford Knife aware of critical troponin. Also, pt having episodes of SB with a HR of 49 non-sustaining. Orders to hold AM dose of atenolol. Pt comfortable and resting at this time. Will report to AM nurse and continue to monitor.  M.Foster Simpson, RN

## 2012-01-23 NOTE — Progress Notes (Signed)
ANTICOAGULATION CONSULT NOTE - Follow Up Consult  Pharmacy Consult for Heparin Indication: NSTEMI  No Known Allergies  Patient Measurements: Height: 6' (182.9 cm) Weight: 218 lb 7.6 oz (99.1 kg) IBW/kg (Calculated) : 77.6  Heparin Dosing Weight: 99kg  Vital Signs: Temp: 97.9 F (36.6 C) (10/09 1200) Temp src: Oral (10/09 1200) BP: 114/73 mmHg (10/09 1200) Pulse Rate: 51  (10/09 1200)  Labs:  Basename 01/23/12 0850 01/23/12 0308 01/23/12 0303 01/22/12 1939  HGB -- 14.0 -- 15.2  HCT -- 41.2 -- 44.6  PLT -- 122* -- 152  APTT -- -- -- --  LABPROT -- 13.4 -- --  INR -- 1.03 -- --  HEPARINUNFRC 0.35 -- -- --  CREATININE -- 1.00 -- 1.12  CKTOTAL -- -- -- --  CKMB -- -- -- --  TROPONINI 4.51* -- 3.44* --    Estimated Creatinine Clearance: 81.4 ml/min (by C-G formula based on Cr of 1).   Medications:  Scheduled:    . acetaminophen  650 mg Oral Once  . albuterol  5 mg Nebulization Once  . antiseptic oral rinse  15 mL Mouth Rinse BID  . aspirin  324 mg Oral Once  . aspirin  324 mg Oral NOW   Or  . aspirin  300 mg Rectal NOW  . aspirin  324 mg Oral Pre-Cath  . aspirin EC  81 mg Oral Daily  . atenolol  25 mg Oral q morning - 10a  . atorvastatin  80 mg Oral q1800  . diazepam  10 mg Oral On Call  . finasteride  5 mg Oral Daily  . heparin  4,000 Units Intravenous Once  .  morphine injection  4 mg Intravenous Once  . nitroGLYCERIN  2-200 mcg/min Intravenous Once  . ondansetron      . ondansetron      . sodium chloride  3 mL Intravenous Q12H  . sodium chloride  3 mL Intravenous Q12H  . DISCONTD: sodium chloride   Intravenous STAT  . DISCONTD: aspirin  324 mg Oral Pre-Cath  . DISCONTD: aspirin EC  81 mg Oral Daily  . DISCONTD: atorvastatin  10 mg Oral q1800  . DISCONTD: diazepam  10 mg Oral On Call   Infusions:    . sodium chloride    . heparin 1,100 Units/hr (01/22/12 2303)  . nitroGLYCERIN 30 mcg/min (01/23/12 0900)    Assessment: 71 y/o male patient  admitted with chest pain, now with positive cardiac enzymes receiving heparin for anticoagulation. Xa level is therapeutic, plan for cardiac cath in am.  Goal of Therapy:  Heparin level 0.3-0.7 units/ml Monitor platelets by anticoagulation protocol: Yes   Plan:  Continue gtt at 1100 units/hr and check level in 8 hours.   Verlene Mayer, PharmD, BCPS Pager [phone] 01/23/2012,12:40 PM

## 2012-01-23 NOTE — ED Notes (Signed)
Admitting MD at [facility], no changes, alert, NAD, calm, interactive, family at [facility] x2, pending bed assignment and admission orders.

## 2012-01-23 NOTE — Progress Notes (Addendum)
CRITICAL VALUE ALERT  Critical value received:  Troponin I-3.44  Date of notification:  01/23/12  Time of notification:  0350  Critical value read back:yes  Nurse who received alert:  M.Foster Simpson, RN  MD notified (1st page):  K.Schorr, NP. Orders to page cardiology.                                        Kym Groom, MD paged  Time of first page:  [phone]  MD notified (2nd page): T. Turner  Time of second page: 0530  Responding MD:  T. Turner  Time MD responded:  0600

## 2012-01-23 NOTE — Progress Notes (Addendum)
PATIENT RESTING AT THIS TIME, C/O CHEST PAIN OF 3/10 WITH HEADACHE OF 8/10 TYLENOL GIVEN PER ORDERS WILL CONTINUE TO MONITOR

## 2012-01-23 NOTE — Progress Notes (Signed)
ANTICOAGULATION CONSULT NOTE - Follow Up Consult  Pharmacy Consult for Heparin Indication: NSTEMI  No Known Allergies  Patient Measurements: Height: 6' (182.9 cm) Weight: 218 lb 7.6 oz (99.1 kg) IBW/kg (Calculated) : 77.6  Heparin Dosing Weight: 99kg  Vital Signs: Temp: 98 F (36.7 C) (10/09 1945) Temp src: Oral (10/09 1945) BP: 135/71 mmHg (10/09 1945) Pulse Rate: 53  (10/09 1945)  Labs:  Basename 01/23/12 1930 01/23/12 1444 01/23/12 0850 01/23/12 0308 01/23/12 0303 01/22/12 1939  HGB -- -- -- 14.0 -- 15.2  HCT -- -- -- 41.2 -- 44.6  PLT -- -- -- 122* -- 152  APTT -- -- -- -- -- --  LABPROT -- -- -- 13.4 -- --  INR -- -- -- 1.03 -- --  HEPARINUNFRC 0.30 -- 0.35 -- -- --  CREATININE -- -- -- 1.00 -- 1.12  CKTOTAL -- -- -- -- -- --  CKMB -- -- -- -- -- --  TROPONINI -- 9.12* 4.51* -- 3.44* --    Estimated Creatinine Clearance: 81.4 ml/min (by C-G formula based on Cr of 1).   Medications:  Scheduled:     . acetaminophen  650 mg Oral Once  . antiseptic oral rinse  15 mL Mouth Rinse BID  . aspirin  324 mg Oral Once  . aspirin  324 mg Oral NOW   Or  . aspirin  300 mg Rectal NOW  . aspirin  324 mg Oral Pre-Cath  . aspirin EC  81 mg Oral Daily  . atenolol  25 mg Oral q morning - 10a  . atorvastatin  80 mg Oral q1800  . diazepam  10 mg Oral On Call  . finasteride  5 mg Oral Daily  . heparin  4,000 Units Intravenous Once  . nitroGLYCERIN  2-200 mcg/min Intravenous Once  . sodium chloride  3 mL Intravenous Q12H  . sodium chloride  3 mL Intravenous Q12H  . DISCONTD: sodium chloride   Intravenous STAT  . DISCONTD: aspirin  324 mg Oral Pre-Cath  . DISCONTD: aspirin EC  81 mg Oral Daily  . DISCONTD: atorvastatin  10 mg Oral q1800  . DISCONTD: diazepam  10 mg Oral On Call   Infusions:     . sodium chloride    . heparin 1,100 Units/hr (01/23/12 1823)  . nitroGLYCERIN 20 mcg/min (01/23/12 1300)    Assessment: 71 y/o male patient admitted with chest pain, now  with positive cardiac enzymes receiving heparin for anticoagulation. Heparin level remains therapeutic, plan for cardiac cath in am.  Goal of Therapy:  Heparin level 0.3-0.7 units/ml Monitor platelets by anticoagulation protocol: Yes   Plan:  Continue gtt at 1100 units/hr   Follow-up daily heparin level or post-cath.   Link Snuffer, PharmD, BCPS Clinical Pharmacist [phone] 01/23/2012,8:31 PM

## 2012-01-23 NOTE — Progress Notes (Addendum)
[facility] PROGRESS NOTE  Mitchell [name] ZOX:[id number] DOB: Nov 14, 1940 DOA: 01/22/2012 PCP: Sheila Oats, MD  Assessment/Plan:    PLAN:   Chest pain with elevated troponin, likely NSTEMI in patient with known CAD.  CT angio PE was negative on 10/8.   -  Appreciate cardiology recommendations -  Continue NTG gtt -  Continue heparin gtt -  Trending troponins -  Likely cath tomorrow morning -  Continue ASA, statin -  Beta blocker held this morning due to bradycardia  -  LDL not at goal, increase atorvastatin to 80mg  pending catheterization -  A1c pending.  Shortness of breath, likely related to ACS.  No evidence of PE or pneumonia or pulmonary edema. -   Continue oxygen for now pending catheterization  Headache, likely secondary to NTG. -  Continue aspirin and tylenol  Abdominal pain with normal LFTs and lipase.  May be musculoskeletal -  RUQ [place] -  Trend LFTs and lipase tomorrow morning.    Left shoulder pain, likely partly musculoskeletal as positional and apparent spasm on exam.   -  Flexeril prn  Dry eyes, start saline eye gtt  Thrombocytopenia, mild decrease from yesterday.  No evidence of bleeding.  May be due to heparin gtt.   -  Trend -  If worsens severely, dc heparin and send HIT panel  DIET:  Healthy heart, then NPO at MN ACCESS:  PIV x 2 IVF:  None PROPH:  Heparin gtt  Code Status: Full code Family Communication:  Spoke with family at bedside Disposition Plan: pending likely cardiac catheterization tomorrow morning, awaiting cardiology recommendations  Brief history:  This is a 71yo WM with a history of cutting balloon PCI of the LAD in 2007, HTN, dyslipidemia and GERD who was in his USOH until yesterday afternoon and took 2 bayer ASA which improved with pain. He had recurrent pain last PM but went to bed and slept all night. When he got up this am he has some mild discomfort in his chest but around 1PM he starting having severe pressure on his  chest with radiation into the left arm and neck. He call EMS and they gave him 2-3 SL NTG. The NTG did improve his pain. Currently he complains of pain in his neck and left arm. The chest pain increases with deep breathing and movement of his left arm. He says that this pain was the worst pain he has ever had but currently it is a 4/10 in intensity. Of note he had some bleeding from his penis Sept 11th and saw Dr. 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 He has had severe abdominal pain in the RUQ for several days that is very tender to palpation.   Consultants:  Cardiology  Procedures:  RUQ [place] pending  CT angio chest 10/8  Antibiotics:  None  HPI/Subjective:  8/10 substernal chest soreness with persistent shortness of breath.  Denies nausea, vomiting.  Has left shoulder pain and was unable to sleep last night.  He has severe bitemporal headache that is not improving with tylenol.    Objective: Filed Vitals:   01/23/12 0400 01/23/12 0500 01/23/12 0800 01/23/12 1000  BP: 114/73 121/71 107/68 110/73  Pulse: 56 54 62 54  Temp:   97.6 F (36.4 C)   TempSrc:   Oral   Resp: 15 16 20 13   Height:  Weight:      SpO2: 95% 96% 96% 98%    Intake/Output Summary (Last 24 hours) at 01/23/12 1010 Last data filed at 01/23/12 0900  Gross per 24 hour  Intake    317 ml  Output    400 ml  Net    -83 ml   Filed Weights   01/22/12 2243 01/23/12 0241  Weight: 90 kg (198 lb 6.6 oz) 99.1 kg (218 lb 7.6 oz)    Exam:  General: Well developed, well nourished, in no acute distress  Head: Eyes PERRLA Lungs: Clear bilaterally to auscultation and percussion.  Heart: HRRR S1 S2 Pulses are 2+ & equal.   Abdomen: Bowel sounds are positive, abdomen soft and tender to palpation over RUQ without rebound or guarding. Extremities:  1+ lower  extremity edema. DP +1.  Left shoulder with muscle spasms.  No bony tenderness over clavicle or scapula.   Neuro: Alert and oriented X 3.  Psych: Good affect, responds appropriately   Data Reviewed: Basic Metabolic Panel:  Lab 01/23/12 1610 01/22/12 1939  NA 139 139  K 3.8 3.8  CL 107 105  CO2 25 25  GLUCOSE 111* 131*  BUN 21 22  CREATININE 1.00 1.12  CALCIUM 8.5 9.0  MG -- --  PHOS -- --   Liver Function Tests:  Lab 01/22/12 1939  AST 18  ALT 15  ALKPHOS 59  BILITOT 0.2*  PROT 5.9*  ALBUMIN 3.3*    Lab 01/22/12 1940  LIPASE 28  AMYLASE --   No results found for this basename: AMMONIA:5 in the last 168 hours CBC:  Lab 01/23/12 0308 01/22/12 1939  WBC 8.7 8.6  NEUTROABS -- 5.3  HGB 14.0 15.2  HCT 41.2 44.6  MCV 86.6 86.6  PLT 122* 152   Cardiac Enzymes:  Lab 01/23/12 0850 01/23/12 0303  CKTOTAL -- --  CKMB -- --  CKMBINDEX -- --  TROPONINI 4.51* 3.44*   BNP (last 3 results)  Basename 01/22/12 1940 05/01/11 0430 04/29/11 1634  PROBNP 162.4* 391.3* 257.2*   CBG: No results found for this basename: GLUCAP:5 in the last 168 hours  Recent Results (from the past 240 hour(s))  MRSA PCR SCREENING     Status: Normal   Collection Time   01/23/12  2:56 AM      Component Value Range Status Comment   MRSA by PCR NEGATIVE  NEGATIVE Final      Studies: Ct Angio Chest Pe W/cm &/or Wo Cm  01/22/2012  *RADIOLOGY REPORT*  Clinical Data: 71 year old male with shortness of breath and chest pain.  CT ANGIOGRAPHY CHEST  Technique:  Multidetector CT imaging of the chest using the standard protocol during bolus administration of intravenous contrast. Multiplanar reconstructed images including MIPs were obtained and reviewed to evaluate the vascular anatomy.  Contrast: 65mL OMNIPAQUE IOHEXOL 350 MG/ML SOLN  Comparison: 04/29/2011.  Findings: Suboptimal contrast bolus timing in the pulmonary arterial tree.  Fortunately there is very little respiratory motion. No focal  filling defect identified in the pulmonary arterial tree to suggest the presence of acute pulmonary embolism.  Atelectatic changes to the major airways.  Increased dependent opacity compatible with atelectasis.  No other abnormal pulmonary opacity.  Negative thoracic inlet.  Mediastinal lipomatosis.  No mediastinal lymphadenopathy.  Coronary artery atherosclerosis.  No pericardial or pleural effusion.  Cholelithiasis.  Other visualized upper abdominal viscera are negative.  Chronic lower sternal fracture. No acute osseous abnormality identified.  IMPRESSION: 1. No evidence of acute pulmonary embolus.  2.  Pulmonary atelectasis.  Coronary artery atherosclerosis. Cholelithiasis.   Original Report Authenticated By: Harley Hallmark, M.D.    Dg Chest Portable 1 View  01/22/2012   *RADIOLOGY REPORT*  Clinical Data: Shortness of breath, chest pressure  CHEST - 1 VIEW  Comparison:  04/29/2011  Findings: The heart size and mediastinal contours are within normal limits.  Both lungs are clear.  IMPRESSION: No active disease.   Original Report Authenticated By: Judie Petit. Ruel Favors, M.D.     Scheduled Meds:   . acetaminophen  650 mg Oral Once  . albuterol  5 mg Nebulization Once  . antiseptic oral rinse  15 mL Mouth Rinse BID  . aspirin  324 mg Oral Once  . aspirin  324 mg Oral NOW   Or  . aspirin  300 mg Rectal NOW  . aspirin  324 mg Oral Pre-Cath  . aspirin EC  81 mg Oral Daily  . aspirin EC  81 mg Oral Daily  . atenolol  25 mg Oral q morning - 10a  . atorvastatin  10 mg Oral q1800  . diazepam  10 mg Oral On Call  . finasteride  5 mg Oral Daily  . heparin  4,000 Units Intravenous Once  .  morphine injection  4 mg Intravenous Once  . nitroGLYCERIN  2-200 mcg/min Intravenous Once  . ondansetron      . ondansetron      . sodium chloride  3 mL Intravenous Q12H  . sodium chloride  3 mL Intravenous Q12H  . DISCONTD: sodium chloride   Intravenous STAT   Continuous Infusions:   . sodium chloride    .  heparin 1,100 Units/hr (01/22/12 2303)  . nitroGLYCERIN 20 mcg/min (01/23/12 0323)    Principal Problem:  *NSTEMI (non-ST elevated myocardial infarction) Active Problems:  Chest pain  CAD (coronary artery disease)  SOB (shortness of breath)  Cholelithiasis    Time spent: 30    Yarielys Beed, [facility]  [facility] Pager [phone]. If 8PM-8AM, please contact night-coverage at [email or web address], password [facility] 01/23/2012, 10:10 AM  LOS: 1 day

## 2012-01-23 NOTE — H&P (Signed)
Chief Complaint:  cp  HPI: 71 yo male h/o cad comes in with 2 days of waxing/wan progressive worsening sscp radiates to left arm.  Associated sob and nausea no vomiting.  No fevers/cough or recent illnesses but has been feeling really tired lately.  No le swelling or edema.  Currently on ntg gtt and hep gtt and cp is much improved but not totally resolved.  Also has been having ruq abd pain from his gallbladder, has h/o cholelithiasis.  Also been dealing with hematuria which has resolved about 3 wks ago, had cytoscopy which was neg per pt report.  Review of Systems:  O/w neg  Past Medical History: Past Medical History  Diagnosis Date  . History of PTCA   . History of cardiac cath   . Hypertension   . Hyperlipidemia   . Depression   . BPH (benign prostatic hypertrophy)   . Shortness of breath   . Anxiety   . GERD (gastroesophageal reflux disease)    Past Surgical History  Procedure Date  . Cataract extraction   . Ganglion cyst removed   . Cardiac catheterization 05/11/2005    PCI OF LAD    Medications: Prior to Admission medications   Medication Sig Start Date End Date Taking? Authorizing Provider  aspirin EC 81 MG tablet Take 243 mg by mouth once.    Yes Historical Provider, MD  aspirin EC 81 MG tablet Take 81 mg by mouth daily.   Yes Historical Provider, MD  atenolol (TENORMIN) 25 MG tablet Take 25 mg by mouth every morning.    Yes Historical Provider, MD  finasteride (PROSCAR) 5 MG tablet Take 5 mg by mouth daily.   Yes Historical Provider, MD  nitroGLYCERIN (NITROSTAT) 0.4 MG SL tablet Place 0.4 mg under the tongue every 5 (five) minutes as needed. FOR CHEST PAIN   Yes Historical Provider, MD    Allergies:  No Known Allergies  Social History:  reports that he has been smoking Pipe.  He has never used smokeless tobacco. He reports that he drinks about 3.5 ounces of alcohol per week. He reports that he does not use illicit drugs.  Family History: Family History    Problem Relation Age of Onset  . Heart failure Mother   . Heart failure Brother     Physical Exam: Filed Vitals:   01/23/12 0015 01/23/12 0045 01/23/12 0130 01/23/12 0145  BP: 123/79 127/81 130/82 124/81  Pulse: 62 57 61 58  Temp:      TempSrc:      Resp: 13 15 17 16   Height:      Weight:      SpO2: 97% 97% 97% 98%   General appearance: alert, cooperative and no distress Lungs: clear to auscultation bilaterally Heart: regular rate and rhythm, S1, S2 normal, no murmur, click, rub or gallop Abdomen: soft, non-tender; bowel sounds normal; no masses,  no organomegaly Extremities: extremities normal, atraumatic, no cyanosis or edema Pulses: 2+ and symmetric Skin: Skin color, texture, turgor normal. No rashes or lesions Neurologic: Grossly normal    Labs on Admission:   [facility] 01/22/12 1939  NA 139  K 3.8  CL 105  CO2 25  GLUCOSE 131*  BUN 22  CREATININE 1.12  CALCIUM 9.0  MG --  PHOS --    Basename 01/22/12 1939  AST 18  ALT 15  ALKPHOS 59  BILITOT 0.2*  PROT 5.9*  ALBUMIN 3.3*    Basename 01/22/12 1940  LIPASE 28  AMYLASE --  Basename 01/22/12 1939  WBC 8.6  NEUTROABS 5.3  HGB 15.2  HCT 44.6  MCV 86.6  PLT 152   Radiological Exams on Admission: Ct Angio Chest Pe W/cm &/or Wo Cm  01/22/2012  *RADIOLOGY REPORT*  Clinical Data: 71 year old male with shortness of breath and chest pain.  CT ANGIOGRAPHY CHEST  Technique:  Multidetector CT imaging of the chest using the standard protocol during bolus administration of intravenous contrast. Multiplanar reconstructed images including MIPs were obtained and reviewed to evaluate the vascular anatomy.  Contrast: 65mL OMNIPAQUE IOHEXOL 350 MG/ML SOLN  Comparison: 04/29/2011.  Findings: Suboptimal contrast bolus timing in the pulmonary arterial tree.  Fortunately there is very little respiratory motion. No focal filling defect identified in the pulmonary arterial tree to suggest the presence of acute pulmonary  embolism.  Atelectatic changes to the major airways.  Increased dependent opacity compatible with atelectasis.  No other abnormal pulmonary opacity.  Negative thoracic inlet.  Mediastinal lipomatosis.  No mediastinal lymphadenopathy.  Coronary artery atherosclerosis.  No pericardial or pleural effusion.  Cholelithiasis.  Other visualized upper abdominal viscera are negative.  Chronic lower sternal fracture. No acute osseous abnormality identified.  IMPRESSION: 1. No evidence of acute pulmonary embolus. 2.  Pulmonary atelectasis.  Coronary artery atherosclerosis. Cholelithiasis.   Original Report Authenticated By: Harley Hallmark, M.D.    Nm Pet Image Initial (pi) Skull Base To Thigh  01/08/2012  *RADIOLOGY REPORT*  Clinical Data: Initial treatment strategy for enlarged pancreatic lymph node on CT.  NUCLEAR MEDICINE PET SKULL BASE TO THIGH  Fasting Blood Glucose:  100  Technique:  15.2 mCi F-18 FDG was injected intravenously. CT data was obtained and used for attenuation correction and anatomic localization only.  (This was not acquired as a diagnostic CT examination.) Additional exam technical data entered on technologist worksheet.  Comparison:  CT abdomen pelvis dated 05/06/2011.  CT chest dated 04/29/2011.  CT abdomen/pelvis dated 10/19/2009.  Findings:  Neck: No hypermetabolic lymph nodes in the neck.  Chest:  No hypermetabolic mediastinal or hilar nodes.  No suspicious pulmonary nodules on the CT scan.  Abdomen/Pelvis:  2.2 x 2.3 cm non-FDG-avid soft tissue lesion in the mid abdomen (series 2/image 153).  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  It is stable versus minimally enlarged from 2011, when it measured 2.1 x 1.8 cm.  No abnormal hypermetabolic activity within the liver, pancreas, adrenal glands, or spleen.  No hypermetabolic lymph nodes in the abdomen or pelvis.   Cholelithiasis.  Colonic diverticulosis.  Prostatomegaly, measuring 6.1 cm in transverse dimension.  Bladder is thick-walled although underdistended.  Prior right inferior rectus hematoma has resolved.  Skeleton:  No focal hypermetabolic activity to suggest skeletal metastasis.  IMPRESSION: No findings specific for malignancy in the neck, chest, abdomen, or pelvis.  No suspicious lymphadenopathy.  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  This likely corresponds to the reported "enlarged pancreatic lymph node."  Additional stable ancillary findings as above.   Original Report Authenticated By: Charline Bills, M.D.    Dg Chest Portable 1 View  01/22/2012   *RADIOLOGY REPORT*  Clinical Data: Shortness of breath, chest pressure  CHEST - 1 VIEW  Comparison:  04/29/2011  Findings: The heart size and mediastinal contours are within normal limits.  Both lungs are clear.  IMPRESSION: No active disease.   Original Report Authenticated By: Judie Petit. Ruel Favors, M.D.     Assessment/Plan Present on Admission:  71 yo male with nstemi .NSTEMI (non-ST elevated myocardial infarction) .Chest pain .CAD (coronary artery disease) .SOB (shortness of breath) .Cholelithiasis  lft and alk phos nml.  Will order abd u/s for his prob symptomatic cholelithiasis but nothing acute at this time.  Pt is [facility] cardiology pt.  Pt already evaluated by cards on call dr turner and will likely get heart cath in am.  Keep npo overnight.  Will cont ntg and hep gtt.  Attempted to call on call MD cardiology but no return call.  Will place in stepdown and serial enzymes.  No evidence of chf at this time.  acs pathway.  Mikah Rottinghaus [phone] 01/23/2012, 2:01 AM

## 2012-01-23 NOTE — Progress Notes (Addendum)
Patient Name: Mitchell [name] Date of Encounter: 01/23/2012    SUBJECTIVE: Doing well with no angina this AM. Markers are positive. Having mechanical left shoulder pain.  TELEMETRY:  NSR: Filed Vitals:   01/23/12 0500 01/23/12 0800 01/23/12 1000 01/23/12 1200  BP: 121/71 107/68 110/73 114/73  Pulse: 54 62 54 51  Temp:  97.6 F (36.4 C)  97.9 F (36.6 C)  TempSrc:  Oral  Oral  Resp: 16 20 13 13   Height:      Weight:      SpO2: 96% 96% 98% 99%    Intake/Output Summary (Last 24 hours) at 01/23/12 1319 Last data filed at 01/23/12 1300  Gross per 24 hour  Intake    437 ml  Output    400 ml  Net     37 ml    LABS: Basic Metabolic Panel:  Basename 01/23/12 0308 01/22/12 1939  NA 139 139  K 3.8 3.8  CL 107 105  CO2 25 25  GLUCOSE 111* 131*  BUN 21 22  CREATININE 1.00 1.12  CALCIUM 8.5 9.0  MG -- --  PHOS -- --   CBC:  Basename 01/23/12 0308 01/22/12 1939  WBC 8.7 8.6  NEUTROABS -- 5.3  HGB 14.0 15.2  HCT 41.2 44.6  MCV 86.6 86.6  PLT 122* 152   Cardiac Enzymes:  Basename 01/23/12 0850 01/23/12 0303  CKTOTAL -- --  CKMB -- --  CKMBINDEX -- --  TROPONINI 4.51* 3.44*   BNP    Component Value Date/Time   PROBNP 162.4* 01/22/2012 1940   Fasting Lipid Panel:  Basename 01/23/12 0308  CHOL 175  HDL 51  LDLCALC 111*  TRIG 63  CHOLHDL 3.4  LDLDIRECT --    Radiology/Studies:  No CHF on CXR CTA chest revealed cholelithiasis and no PE  ECG : normal x 2 Physical Exam: Blood pressure 114/73, pulse 51, temperature 97.9 F (36.6 C), temperature source Oral, resp. rate 13, height 6' (1.829 m), weight 99.1 kg (218 lb 7.6 oz), SpO2 99.00%. Weight change:    S4 gallop. Chest clear Extremities - edema.  ASSESSMENT:  1. NSTEMI with normal appearing ECG. Suspect circumflex/OM disease. 2. Chronic pain syndrome 3. Hypertension 4. CAD with prior PCI LAD 2006 5. Hyperlipidemia  Plan:  1. IV hydration since got contrast late last night. 2. Cor angio and  possible PCI in AM 3. NPO after mid night. Discussed cath and possible PCI including risks. He agrees to proceed in AM.   Signed, Veatrice Kells W 01/23/2012, 1:19 PM

## 2012-01-24 ENCOUNTER — Other Ambulatory Visit: Payer: Self-pay

## 2012-01-24 ENCOUNTER — Encounter (HOSPITAL_COMMUNITY)
Admission: EM | Disposition: A | Discharge status: Home / Self Care | Payer: Self-pay | Source: Home / Self Care | Attending: Internal Medicine

## 2012-01-24 HISTORY — PX: LEFT HEART CATHETERIZATION WITH CORONARY ANGIOGRAM: SHX5451

## 2012-01-24 LAB — POCT ACTIVATED CLOTTING TIME: Activated Clotting Time: 394 seconds

## 2012-01-24 LAB — COMPREHENSIVE METABOLIC PANEL
ALT: 21 U/L (ref 0–53)
AST: 56 U/L — ABNORMAL HIGH (ref 0–37)
Alkaline Phosphatase: 53 U/L (ref 39–117)
CO2: 25 mEq/L (ref 19–32)
Calcium: 8.5 mg/dL (ref 8.4–10.5)
Chloride: 105 mEq/L (ref 96–112)
GFR calc Af Amer: >90 mL/min (ref >90)
GFR calc non Af Amer: 85 mL/min — ABNORMAL LOW (ref >90)
Glucose, Bld: 126 mg/dL — ABNORMAL HIGH (ref 70–99)
Potassium: 4.1 mEq/L (ref 3.5–5.1)
Sodium: 137 mEq/L (ref 135–145)
Total Bilirubin: 0.4 mg/dL (ref 0.3–1.2)

## 2012-01-24 LAB — CBC
Hemoglobin: 13.5 g/dL (ref 13.0–17.0)
Platelets: 123 10*3/uL — ABNORMAL LOW (ref 150–400)
RBC: 4.61 MIL/uL (ref 4.22–5.81)
WBC: 8.6 10*3/uL (ref 4.0–10.5)

## 2012-01-24 LAB — TROPONIN I
Troponin I: 7.33 ng/mL (ref <0.30)
Troponin I: 4.47 ng/mL (ref <0.30)

## 2012-01-24 SURGERY — LEFT HEART CATHETERIZATION WITH CORONARY ANGIOGRAM
Anesthesia: LOCAL | Laterality: Bilateral

## 2012-01-24 MED ORDER — LIDOCAINE HCL (PF) 1 % IJ SOLN
INTRAMUSCULAR | Status: AC
  Filled 2012-01-24: qty 30

## 2012-01-24 MED ORDER — SODIUM CHLORIDE 0.9 % IV SOLN
INTRAVENOUS | Status: AC
  Administered 2012-01-24: 16:00:00 via INTRAVENOUS

## 2012-01-24 MED ORDER — SODIUM CHLORIDE 0.9 % IV SOLN
INTRAVENOUS | Status: DC
Start: 2012-01-24 — End: 2012-01-24

## 2012-01-24 MED ORDER — FENTANYL CITRATE 0.05 MG/ML IJ SOLN
INTRAMUSCULAR | Status: AC
  Filled 2012-01-24: qty 2

## 2012-01-24 MED ORDER — TICAGRELOR 90 MG PO TABS
ORAL_TABLET | ORAL | Status: AC
  Filled 2012-01-24: qty 2

## 2012-01-24 MED ORDER — MIDAZOLAM HCL 2 MG/2ML IJ SOLN
INTRAMUSCULAR | Status: AC
  Filled 2012-01-24: qty 2

## 2012-01-24 MED ORDER — ACETAMINOPHEN 325 MG PO TABS: 650.0000 mg | ORAL_TABLET | ORAL | Status: DC | PRN

## 2012-01-24 MED ORDER — BIVALIRUDIN 250 MG IV SOLR
INTRAVENOUS | Status: AC
  Filled 2012-01-24: qty 250

## 2012-01-24 MED ORDER — HEPARIN SODIUM (PORCINE) 1000 UNIT/ML IJ SOLN
INTRAMUSCULAR | Status: AC
  Filled 2012-01-24: qty 1

## 2012-01-24 MED ORDER — SODIUM CHLORIDE 0.9 % IV SOLN
0.2500 mg/kg/h | INTRAVENOUS | Status: DC
  Filled 2012-01-24: qty 250

## 2012-01-24 MED ORDER — HEPARIN (PORCINE) IN NACL 2-0.9 UNIT/ML-% IJ SOLN
INTRAMUSCULAR | Status: AC
  Filled 2012-01-24: qty 1500

## 2012-01-24 MED ORDER — TICAGRELOR 90 MG PO TABS
90.0000 mg | ORAL_TABLET | Freq: Two times a day (BID) | ORAL | Status: DC
  Administered 2012-01-24 – 2012-01-25 (×2): 90 mg via ORAL
  Filled 2012-01-24 (×3): qty 1

## 2012-01-24 MED ORDER — VERAPAMIL HCL 2.5 MG/ML IV SOLN
INTRAVENOUS | Status: AC
  Filled 2012-01-24: qty 2

## 2012-01-24 MED ORDER — NITROGLYCERIN 0.2 MG/ML ON CALL CATH LAB
INTRAVENOUS | Status: AC
  Filled 2012-01-24: qty 1

## 2012-01-24 NOTE — Progress Notes (Signed)
TR BAND REMOVAL  LOCATION:    right radial  DEFLATED PER PROTOCOL:    yes  TIME BAND OFF / DRESSING APPLIED:    1830   SITE UPON ARRIVAL:    Level 0  SITE AFTER BAND REMOVAL:    Level 0  REVERSE ALLEN'S TEST:     positive  CIRCULATION SENSATION AND MOVEMENT:    Within Normal Limits   yes  COMMENTS:   Tolerated procedure well 

## 2012-01-24 NOTE — CV Procedure (Signed)
Diagnostic Cardiac Catheterization Report  Mitchell [name]  71 y.o.  male 08/19/1940  Procedure Date:01/24/2012 Referring Physician: Deboraha Sprang at [facility] Primary Cardiologist:: HWBSmith, III, MD   PROCEDURE:  Left heart catheterization with selective coronary angiography, left ventriculogram, and DES distal RCA.  INDICATIONS:  ACS with NSTEMI and abnormal markers with vague recurring chest and shoulder pain.  The risks, benefits, and details of the procedure were explained to the patient.  The patient verbalized understanding and wanted to proceed.  Informed written consent was obtained.  PROCEDURE TECHNIQUE:  After Xylocaine anesthesia a 76F sheath was placed in the right radial artery with a single anterior needle wall stick.   Coronary angiography was done using a 76F 4 cm JR, 3.5 CM JL catheter.  Left ventriculography was done using a 76F JR catheter.  After reviewing the images we noted a 99% stenosis in the proximal portion of a large PDA and had TIMI grade 2 flow. Collaterals from the distal circumflex to this territory were noted.  We upgraded to a 6 French brachial sheath. Tortuosity in the subclavian and innominate may the catheter movement and torque ability difficult. An Angiomax bolus and infusion was started. ACT was documented greater than 300 seconds. 180 mg of Brilinta was loaded. A BMW wire was used with some difficulty to cross the stenosis in the PDA. Our guide catheter was a 6 French RCA XB catheter. Predilatation was performed with a 2.5 x 12 mm balloon. We then positioned and deployed a 2.75 x 15 mm Kellogg at 10 atmospheres. 2 balloon inflations were performed to a peak pressure of 13 atmospheres. Mild chest discomfort occurred during balloon inflation. High pressure post dilatation was not felt to be necessary.  CONTRAST:  Total of 270 cc.  COMPLICATIONS:  None.    HEMODYNAMICS:  Aortic pressure was 166/69 mmHg; LV pressure was 164/1 mmHg; LVEDP  20 mm mercury.  There was no gradient between the left ventricle and aorta.    ANGIOGRAPHIC DATA:   The left main coronary artery is short and widely patent..  The left anterior descending artery is diffuse luminal irregularities are noted throughout the proximal vessel. The bifurcation of a large diagonal and continuation of the LAD in the mid vessel which was previously intervened upon was widely patent. Up to 50% stenosis is noted in the midportion of the LAD. The distal PDA fills around the apex via collaterals  The left circumflex artery is a small and gives origin to a bifurcating first obtuse marginal. Minimal luminal irregularities are noted.  The right coronary artery is dominant vessel with 2 large left ventricular branches and a large posterior descending branch. The posterior descending branch contains a 99% stenosis with TIMI grade 2 in the proximal one third. This was felt to be the culprit for the patient's presentation.Marland Kitchen  LEFT VENTRICULOGRAM:  Left ventricular angiogram was done in the 30 RAO projection and revealed normal left ventricular wall motion and systolic function with an estimated ejection fraction of 50% %.    CORONARY PCI: The 99% stenosis in the proximal PDA with noted TIMI 2 flow was reduced to 0 presented with TIMI grade 3 flow after PCI and stenting with a drug-eluting device to an effective diameter of 2.9 mm. TIMI grade 3 flow was reestablished.  IMPRESSIONS:  1. Non-ST elevation myocardial infarction due to 2 subtotal occlusion of the proximal PDA.  2. Successful DES implantation in the posterior descending branch as noted above  3.  Moderate mid left anterior descending coronary disease but no high-grade obstruction.  4. Widely patent small circumflex territory  5. Overall preserved LV systolic function with elevated EDP suggesting diastolic heart failure.   RECOMMENDATION:  1. Brilinta and aspirin for 12 months  2. Consider discharge tomorrow morning if  no complications.

## 2012-01-24 NOTE — Progress Notes (Signed)
Patient Name: Mitchell [name] Date of Encounter: 01/24/2012    SUBJECTIVE:No chest discomfort during the night. He has severe HA due to NTG drip. He has chronic dyspnea, no different than usual COPD related breathing problems.  TELEMETRY:  NSR: Filed Vitals:   01/24/12 0500 01/24/12 0600 01/24/12 0700 01/24/12 0900  BP: 118/68 136/67 124/70 120/62  Pulse:    58  Temp:    97.8 F (36.6 C)  TempSrc:    Oral  Resp: 17 18 14 15   Height:      Weight:      SpO2:   100% 100%    Intake/Output Summary (Last 24 hours) at 01/24/12 1102 Last data filed at 01/24/12 0900  Gross per 24 hour  Intake   2890 ml  Output   1075 ml  Net   1815 ml    LABS: Basic Metabolic Panel:  Basename 01/24/12 0200 01/23/12 0308  NA 137 139  K 4.1 3.8  CL 105 107  CO2 25 25  GLUCOSE 126* 111*  BUN 14 21  CREATININE 0.85 1.00  CALCIUM 8.5 8.5  MG -- --  PHOS -- --   CBC:  Basename 01/24/12 0200 01/23/12 0308 01/22/12 1939  WBC 8.6 8.7 --  NEUTROABS -- -- 5.3  HGB 13.5 14.0 --  HCT 40.1 41.2 --  MCV 87.0 86.6 --  PLT 123* 122* --   Cardiac Enzymes:  Basename 01/24/12 0821 01/24/12 0200 01/23/12 1930  CKTOTAL -- -- --  CKMB -- -- --  CKMBINDEX -- -- --  TROPONINI 4.47* 7.33* 3.91*   Fasting Lipid Panel:  Basename 01/23/12 0308  CHOL 175  HDL 51  LDLCALC 111*  TRIG 63  CHOLHDL 3.4  LDLDIRECT --   ECG: normal  Radiology/Studies:  No new.  Physical Exam: Blood pressure 120/62, pulse 58, temperature 97.8 F (36.6 C), temperature source Oral, resp. rate 15, height 6' (1.829 m), weight 99.1 kg (218 lb 7.6 oz), SpO2 100.00%. Weight change:    S4 gallop.  Chest is clear Extremities are without edema.  ASSESSMENT:  1. NSTEMI. No recurrent CP since admission. ECG is normal but the TI elevations are c/w MI 2. Diastolic heart failure, acute on chronic aggravated by IV fluid for cath later today. 3. Hypertension, controlled.  Plan:  1. For cath and possible PCI this AM when lab  available. At risk for renal injury from contrast, CVA, MI, Death, bleeding, limb ischemia, etc. Discussed with patient again this AM with the family present. 2. Will d/c NTG when anatomy iddentified.  Windy Fast W 01/24/2012, 11:02 AM

## 2012-01-24 NOTE — Progress Notes (Signed)
[facility] PROGRESS NOTE  OSHA ERRICO ZOX:[id number] DOB: [birth year] DOA: 01/22/2012 PCP: Sheila Oats, MD  Assessment/Plan: Principal Problem:  *NSTEMI (non-ST elevated myocardial infarction) Due to stenosis of prox PDA s/p DES stent- Brilinta and ASA for 12 mo  Active Problems:  Shortness of breath on exertion Due to above lesion?- follow for improvement now that stenting has been accomplished   Hypertension Cont Atenolol  RUQ pain Pt states pain started during the chest pain and intensified as the chest pain increased. May be a radiation of his chest pain but he also stated that pain increased when he tried to move or turn therefore, it may be musculoskeletal. Will cont to follow.   Morbid obesity Will need to address importance of weight loss prior to d/c  Dyslipidemia Elevated lDL- has been started on high dose Lipitor   Brief narrative: 71 yo male h/o cad comes in with 2 days of waxing/wan progressive worsening sscp radiates to left arm. Associated sob and nausea no vomiting. No fevers/cough or recent illnesses but has been feeling really tired lately. No le swelling or edema. Currently on ntg gtt and hep gtt and cp is much improved but not totally resolved. Also has been having ruq abd pain from his gallbladder, has h/o cholelithiasis. Also been dealing with hematuria which has resolved about 3 wks ago, had cytoscopy which was neg per pt report.   Consultants:  cardiology  Procedures:  Cardiac cath 10/10  Antibiotics:  None   HPI/Subjective: Pt alert, no current chest pain but chest is "sore". No abdominal pain either. He has a severe headache from Nitro infusion. He states he is severely constipated and attributes his abdominal pain to this.   Objective: Filed Vitals:   01/24/12 0700 01/24/12 0900 01/24/12 1107 01/24/12 1508  BP: 124/70 120/62  164/46  Pulse:  58 54 66  Temp:  97.8 F (36.6 C)  97.9 F (36.6 C)  TempSrc:  Oral  Oral  Resp: 14  15  16   Height:      Weight:      SpO2: 100% 100%  98%    Intake/Output Summary (Last 24 hours) at 01/24/12 1512 Last data filed at 01/24/12 0900  Gross per 24 hour  Intake   2536 ml  Output   1075 ml  Net   1461 ml    Exam:   General:  Alert, mild distress due to headache   Cardiovascular: RRR, no murmurs  Respiratory: CTA b/l   Abdomen: Mild tenderness in RUQ and epigastrium, BS+, Abd quite distended and tympanic  Ext: no c/c/e  Data Reviewed: Basic Metabolic Panel:  Lab 01/24/12 8119 01/23/12 0308 01/22/12 1939  NA 137 139 139  K 4.1 3.8 3.8  CL 105 107 105  CO2 25 25 25   GLUCOSE 126* 111* 131*  BUN 14 21 22   CREATININE 0.85 1.00 1.12  CALCIUM 8.5 8.5 9.0  MG -- -- --  PHOS -- -- --   Liver Function Tests:  Lab 01/24/12 0200 01/22/12 1939  AST 56* 18  ALT 21 15  ALKPHOS 53 59  BILITOT 0.4 0.2*  PROT 5.4* 5.9*  ALBUMIN 2.9* 3.3*    Lab 01/24/12 0200 01/22/12 1940  LIPASE 36 28  AMYLASE -- --   No results found for this basename: AMMONIA:5 in the last 168 hours CBC:  Lab 01/24/12 0200 01/23/12 0308 01/22/12 1939  WBC 8.6 8.7 8.6  NEUTROABS -- -- 5.3  HGB 13.5 14.0 15.2  HCT 40.1  41.2 44.6  MCV 87.0 86.6 86.6  PLT 123* 122* 152   Cardiac Enzymes:  Lab 01/24/12 0821 01/24/12 0200 01/23/12 1930 01/23/12 1444 01/23/12 0850  CKTOTAL -- -- -- -- --  CKMB -- -- -- -- --  CKMBINDEX -- -- -- -- --  TROPONINI 4.47* 7.33* 3.91* 9.12* 4.51*   BNP (last 3 results)  Basename 01/22/12 1940 05/01/11 0430 04/29/11 1634  PROBNP 162.4* 391.3* 257.2*   CBG: No results found for this basename: GLUCAP:5 in the last 168 hours  Recent Results (from the past 240 hour(s))  MRSA PCR SCREENING     Status: Normal   Collection Time   01/23/12  2:56 AM      Component Value Range Status Comment   MRSA by PCR NEGATIVE  NEGATIVE Final      Studies: Ct Angio Chest Pe W/cm &/or Wo Cm  01/22/2012  *RADIOLOGY REPORT*  Clinical Data: 71 year old male with  shortness of breath and chest pain.  CT ANGIOGRAPHY CHEST  Technique:  Multidetector CT imaging of the chest using the standard protocol during bolus administration of intravenous contrast. Multiplanar reconstructed images including MIPs were obtained and reviewed to evaluate the vascular anatomy.  Contrast: 65mL OMNIPAQUE IOHEXOL 350 MG/ML SOLN  Comparison: 04/29/2011.  Findings: Suboptimal contrast bolus timing in the pulmonary arterial tree.  Fortunately there is very little respiratory motion. No focal filling defect identified in the pulmonary arterial tree to suggest the presence of acute pulmonary embolism.  Atelectatic changes to the major airways.  Increased dependent opacity compatible with atelectasis.  No other abnormal pulmonary opacity.  Negative thoracic inlet.  Mediastinal lipomatosis.  No mediastinal lymphadenopathy.  Coronary artery atherosclerosis.  No pericardial or pleural effusion.  Cholelithiasis.  Other visualized upper abdominal viscera are negative.  Chronic lower sternal fracture. No acute osseous abnormality identified.  IMPRESSION: 1. No evidence of acute pulmonary embolus. 2.  Pulmonary atelectasis.  Coronary artery atherosclerosis. Cholelithiasis.   Original Report Authenticated By: Harley Hallmark, M.D.    Nm Pet Image Initial (pi) Skull Base To Thigh  01/08/2012  *RADIOLOGY REPORT*  Clinical Data: Initial treatment strategy for enlarged pancreatic lymph node on CT.  NUCLEAR MEDICINE PET SKULL BASE TO THIGH  Fasting Blood Glucose:  100  Technique:  15.2 mCi F-18 FDG was injected intravenously. CT data was obtained and used for attenuation correction and anatomic localization only.  (This was not acquired as a diagnostic CT examination.) Additional exam technical data entered on technologist worksheet.  Comparison:  CT abdomen pelvis dated 05/06/2011.  CT chest dated 04/29/2011.  CT abdomen/pelvis dated 10/19/2009.  Findings:  Neck: No hypermetabolic lymph nodes in the neck.  Chest:   No hypermetabolic mediastinal or hilar nodes.  No suspicious pulmonary nodules on the CT scan.  Abdomen/Pelvis:  2.2 x 2.3 cm non-FDG-avid soft tissue lesion in the mid abdomen (series 2/image 153).  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  It is stable versus minimally enlarged from 2011, when it measured 2.1 x 1.8 cm.  No abnormal hypermetabolic activity within the liver, pancreas, adrenal glands, or spleen.  No hypermetabolic lymph nodes in the abdomen or pelvis.  Cholelithiasis.  Colonic diverticulosis.  Prostatomegaly, measuring 6.1 cm in transverse dimension.  Bladder is thick-walled although underdistended.  Prior right inferior rectus hematoma has resolved.  Skeleton:  No focal hypermetabolic activity to suggest skeletal metastasis.  IMPRESSION: No findings specific for malignancy in the neck, chest, abdomen, or pelvis.  No suspicious lymphadenopathy.  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  This likely corresponds to the reported "enlarged pancreatic lymph node."  Additional stable ancillary findings as above.   Original Report Authenticated By: Charline Bills, M.D.    Dg Chest Portable 1 View  01/22/2012   *RADIOLOGY REPORT*  Clinical Data: Shortness of breath, chest pressure  CHEST - 1 VIEW  Comparison:  04/29/2011  Findings: The heart size and mediastinal contours are within normal limits.  Both lungs are clear.  IMPRESSION: No active disease.   Original Report Authenticated By: Judie Petit. Ruel Favors, M.D.     Scheduled Meds:   . antiseptic oral rinse  15 mL Mouth Rinse BID  . aspirin  324 mg Oral Pre-Cath  . aspirin EC  81 mg Oral Daily  . atenolol  25 mg Oral q morning - 10a  . atorvastatin  80 mg Oral q1800  . bivalirudin      .  diazepam  10 mg Oral On Call  . fentaNYL      . fentaNYL      . finasteride  5 mg Oral Daily  . heparin      . heparin      . lidocaine      . midazolam      . midazolam      . nitroGLYCERIN      . sodium chloride  3 mL Intravenous Q12H  . Ticagrelor      . verapamil      . DISCONTD: bivalirudin (ANGIOMAX) infusion 5 mg/mL (Cath Lab,ACS,PCI indication)  0.25 mg/kg/hr Intravenous To Cath  . DISCONTD: sodium chloride  3 mL Intravenous Q12H   Continuous Infusions:   . heparin Stopped (01/24/12 1040)  . nitroGLYCERIN Stopped (01/24/12 1207)  . DISCONTD: sodium chloride 150 mL/hr at 01/24/12 0957    ________________________________________________________________________  Time spent: 35 min   [employer]  [facility] Pager [phone] If 8PM-8AM, please contact night-coverage at [email or web address], password [facility] 01/24/2012, 3:12 PM  LOS: 2 days

## 2012-01-25 LAB — CBC
HCT: 45.2 % (ref 39.0–52.0)
MCV: 86.1 fL (ref 78.0–100.0)
RBC: 5.25 MIL/uL (ref 4.22–5.81)
WBC: 8.6 10*3/uL (ref 4.0–10.5)

## 2012-01-25 LAB — BASIC METABOLIC PANEL
BUN: 9 mg/dL (ref 6–23)
CO2: 27 mEq/L (ref 19–32)
Chloride: 104 mEq/L (ref 96–112)
Creatinine, Ser: 0.88 mg/dL (ref 0.50–1.35)

## 2012-01-25 MED ORDER — TICAGRELOR 90 MG PO TABS: 90.0000 mg | ORAL_TABLET | Freq: Two times a day (BID) | ORAL | Status: DC

## 2012-01-25 MED ORDER — ATORVASTATIN CALCIUM 80 MG PO TABS: 80.0000 mg | ORAL_TABLET | Freq: Every day | ORAL | Status: DC

## 2012-01-25 MED FILL — Dextrose Inj 5%: INTRAVENOUS | Qty: 50 | Status: AC

## 2012-01-25 NOTE — Progress Notes (Signed)
CARDIAC REHAB PHASE I   PRE:  Rate/Rhythm: 62SR  BP:  Supine: 139/87  Sitting:   Standing:    SaO2: 94%RA  MODE:  Ambulation: 350 ft   POST:  Rate/Rhythem: 74SR  BP:  Supine:   Sitting: 163/74  Standing:    SaO2: 95%RA 4540-9811 Pt walked 350 ft on RA with hand held asst. Gait slow and steady. Had not walked since Tues. No c/o CP. Education completed. Permission given to set up CRP2 [place]. Set up breakfast and assisted pt. To recliner after walk. Call bell in reach.  Mitchell [name]

## 2012-01-25 NOTE — Discharge Summary (Signed)
Patient ID: Mitchell [name] MRN: [id number] DOB/AGE: 72-Aug-1942 72 y.o.  Admit date: 01/22/2012 Discharge date: 01/25/2012  Primary Discharge Diagnosis: Non STEMI Secondary Discharge Diagnosis: Coronary artery disease  Hypertension  Cholelithiasis  COPD  Patient Active Problem List   Diagnosis Date Noted  . NSTEMI (non-ST elevated myocardial infarction) 01/23/2012    Priority: High  . Chest pain 01/23/2012  . CAD (coronary artery disease) 01/23/2012  . SOB (shortness of breath) 01/23/2012  . Cholelithiasis 01/23/2012  . Influenza A 05/01/2011  . Hypoxemia 05/01/2011  . Shortness of breath 04/29/2011  . Hypertension 04/29/2011   Significant Diagnostic Studies: Cardiac catheterization and PCI of the PDA  Consults: None  Hospital Course: Patient was admitted to the hospital with an atypical pain syndrome in the chest. Because of this he underwent a somewhat circuitous evaluation including a CT angiogram of his chest and abdomen. He is found to have gallstones. Cardiac markers subsequently returned abnormal. IV nitroglycerin and heparin was started. He was taken to the catheterization laboratory on 01/24/2012 and from out of subtotally occluded posterior descending artery of the right coronary. This was treated with a drug-eluting stent with a good result. No complications occurred overnight. The patient is discharged in improved condition.   Discharge Exam: Blood pressure 149/80, pulse 64, temperature 97.9 F (36.6 C), temperature source Oral, resp. rate 13, height 6' (1.829 m), weight 101.7 kg (224 lb 3.3 oz), SpO2 94.00%.    The right radial cath site is unremarkable.  The lungs are clear.  Cardiac exam reveals no gallop or rub.  Neurological exam is unremarkable. Labs:   Lab Results  Component Value Date   WBC 8.6 01/25/2012   HGB 15.1 01/25/2012   HCT 45.2 01/25/2012   MCV 86.1 01/25/2012   PLT 130* 01/25/2012    Lab 01/24/12 0200  NA 137  K 4.1  CL  105  CO2 25  BUN 14  CREATININE 0.85  CALCIUM 8.5  PROT 5.4*  BILITOT 0.4  ALKPHOS 53  ALT 21  AST 56*  GLUCOSE 126*   Lab Results  Component Value Date   CKTOTAL 219 04/30/2011   CKMB 3.8 04/30/2011   TROPONINI 4.47* 01/24/2012    Lab Results  Component Value Date   CHOL 175 01/23/2012   CHOL 187 05/01/2011   Lab Results  Component Value Date   HDL 51 01/23/2012   HDL 59 5/80/9983   Lab Results  Component Value Date   LDLCALC 111* 01/23/2012   LDLCALC 113* 05/01/2011   Lab Results  Component Value Date   TRIG 63 01/23/2012   TRIG 76 05/01/2011   Lab Results  Component Value Date   CHOLHDL 3.4 01/23/2012   CHOLHDL 3.2 05/01/2011   No results found for this basename: LDLDIRECT      Radiology: No acute abnormality on chest x-ray EKG: EKG on 01/25/2012 is normal.  FOLLOW UP PLANS AND APPOINTMENTS    Medication List     As of 01/25/2012  7:10 AM    TAKE these medications         aspirin EC 81 MG tablet   Take 243 mg by mouth once.      atenolol 25 MG tablet   Commonly known as: TENORMIN   Take 25 mg by mouth every morning.      atorvastatin 80 MG tablet   Commonly known as: LIPITOR   Take 1 tablet (80 mg total) by mouth daily at 6 PM.  finasteride 5 MG tablet   Commonly known as: PROSCAR   Take 5 mg by mouth daily.      nitroGLYCERIN 0.4 MG SL tablet   Commonly known as: NITROSTAT   Place 0.4 mg under the tongue every 5 (five) minutes as needed. FOR CHEST PAIN      Ticagrelor 90 MG Tabs tablet   Commonly known as: BRILINTA   Take 1 tablet (90 mg total) by mouth 2 (two) times daily.           Follow-up Information    Follow up with Lesleigh Noe, MD. On 01/30/2012. (9:10 AM)    Contact information:   [address] [place] [address] [phone]          BRING ALL MEDICATIONS WITH YOU TO FOLLOW UP APPOINTMENTS  Time spent with patient to include physician time: 20 minutes    Signed: Lesleigh Noe 01/25/2012, 7:10 AM

## 2012-02-14 ENCOUNTER — Emergency Department (HOSPITAL_COMMUNITY): Payer: Medicare HMO

## 2012-02-14 ENCOUNTER — Encounter (HOSPITAL_COMMUNITY): Payer: Self-pay | Admitting: Unknown Physician Specialty

## 2012-02-14 ENCOUNTER — Observation Stay (HOSPITAL_COMMUNITY)
Admission: EM | Admit: 2012-02-14 | Discharge: 2012-02-15 | Disposition: A | Discharge status: Home / Self Care | Payer: Medicare HMO | Attending: Interventional Cardiology | Admitting: Interventional Cardiology

## 2012-02-14 DIAGNOSIS — Z8659 Personal history of other mental and behavioral disorders: Secondary | ICD-10-CM | POA: Insufficient documentation

## 2012-02-14 DIAGNOSIS — R0602 Shortness of breath: Secondary | ICD-10-CM | POA: Insufficient documentation

## 2012-02-14 DIAGNOSIS — I10 Essential (primary) hypertension: Secondary | ICD-10-CM | POA: Insufficient documentation

## 2012-02-14 DIAGNOSIS — Z9861 Coronary angioplasty status: Secondary | ICD-10-CM | POA: Insufficient documentation

## 2012-02-14 DIAGNOSIS — R079 Chest pain, unspecified: Secondary | ICD-10-CM | POA: Diagnosis present

## 2012-02-14 DIAGNOSIS — I25119 Atherosclerotic heart disease of native coronary artery with unspecified angina pectoris: Secondary | ICD-10-CM | POA: Diagnosis present

## 2012-02-14 DIAGNOSIS — E782 Mixed hyperlipidemia: Secondary | ICD-10-CM | POA: Insufficient documentation

## 2012-02-14 DIAGNOSIS — K219 Gastro-esophageal reflux disease without esophagitis: Secondary | ICD-10-CM | POA: Insufficient documentation

## 2012-02-14 DIAGNOSIS — R0600 Dyspnea, unspecified: Secondary | ICD-10-CM | POA: Diagnosis present

## 2012-02-14 DIAGNOSIS — I252 Old myocardial infarction: Secondary | ICD-10-CM | POA: Insufficient documentation

## 2012-02-14 DIAGNOSIS — I2510 Atherosclerotic heart disease of native coronary artery without angina pectoris: Principal | ICD-10-CM | POA: Diagnosis present

## 2012-02-14 DIAGNOSIS — E785 Hyperlipidemia, unspecified: Secondary | ICD-10-CM | POA: Insufficient documentation

## 2012-02-14 DIAGNOSIS — R9431 Abnormal electrocardiogram [ECG] [EKG]: Secondary | ICD-10-CM

## 2012-02-14 HISTORY — DX: Chronic obstructive pulmonary disease, unspecified: J44.9

## 2012-02-14 HISTORY — DX: Angina pectoris, unspecified: I20.9

## 2012-02-14 HISTORY — DX: Atherosclerotic heart disease of native coronary artery without angina pectoris: I25.10

## 2012-02-14 HISTORY — DX: Acute myocardial infarction, unspecified: I21.9

## 2012-02-14 LAB — COMPREHENSIVE METABOLIC PANEL
BUN: 19 mg/dL (ref 6–23)
CO2: 24 mEq/L (ref 19–32)
Chloride: 105 mEq/L (ref 96–112)
Creatinine, Ser: 0.95 mg/dL (ref 0.50–1.35)
GFR calc non Af Amer: 81 mL/min — ABNORMAL LOW (ref >90)
Glucose, Bld: 144 mg/dL — ABNORMAL HIGH (ref 70–99)
Total Bilirubin: 0.3 mg/dL (ref 0.3–1.2)

## 2012-02-14 LAB — BASIC METABOLIC PANEL
BUN: 19 mg/dL (ref 6–23)
CO2: 28 mEq/L (ref 19–32)
Chloride: 105 mEq/L (ref 96–112)
Creatinine, Ser: 1.05 mg/dL (ref 0.50–1.35)
GFR calc Af Amer: 80 mL/min — ABNORMAL LOW (ref >90)
Glucose, Bld: 117 mg/dL — ABNORMAL HIGH (ref 70–99)

## 2012-02-14 LAB — CBC WITH DIFFERENTIAL/PLATELET
Basophils Relative: 1 % (ref 0–1)
HCT: 44.1 % (ref 39.0–52.0)
Hemoglobin: 14.9 g/dL (ref 13.0–17.0)
Lymphs Abs: 1.7 10*3/uL (ref 0.7–4.0)
MCH: 29.1 pg (ref 26.0–34.0)
MCHC: 33.8 g/dL (ref 30.0–36.0)
Monocytes Absolute: 1 10*3/uL (ref 0.1–1.0)
Monocytes Relative: 12 % (ref 3–12)
Neutro Abs: 5.8 10*3/uL (ref 1.7–7.7)

## 2012-02-14 LAB — TROPONIN I: Troponin I: <0.3 ng/mL (ref <0.30)

## 2012-02-14 MED ORDER — SODIUM CHLORIDE 0.9 % IJ SOLN: 3.0000 mL | INTRAMUSCULAR | Status: DC | PRN

## 2012-02-14 MED ORDER — CLOPIDOGREL BISULFATE 75 MG PO TABS
75.0000 mg | ORAL_TABLET | Freq: Every day | ORAL | Status: DC
  Administered 2012-02-15: 75 mg via ORAL
  Filled 2012-02-14 (×2): qty 1

## 2012-02-14 MED ORDER — HEPARIN BOLUS VIA INFUSION
4000.0000 [IU] | Freq: Once | INTRAVENOUS | Status: AC
  Administered 2012-02-14: 4000 [IU] via INTRAVENOUS

## 2012-02-14 MED ORDER — ONDANSETRON HCL 4 MG/2ML IJ SOLN: 4.0000 mg | Freq: Four times a day (QID) | INTRAMUSCULAR | Status: DC | PRN

## 2012-02-14 MED ORDER — SODIUM CHLORIDE 0.9 % IV SOLN: 250.0000 mL | INTRAVENOUS | Status: DC | PRN

## 2012-02-14 MED ORDER — CLOPIDOGREL BISULFATE 75 MG PO TABS
150.0000 mg | ORAL_TABLET | Freq: Once | ORAL | Status: AC
  Administered 2012-02-14: 150 mg via ORAL
  Filled 2012-02-14: qty 2

## 2012-02-14 MED ORDER — SODIUM CHLORIDE 0.9 % IJ SOLN
3.0000 mL | Freq: Two times a day (BID) | INTRAMUSCULAR | Status: DC
  Administered 2012-02-14 – 2012-02-15 (×2): 3 mL via INTRAVENOUS

## 2012-02-14 MED ORDER — NITROGLYCERIN 0.4 MG SL SUBL: 0.4000 mg | SUBLINGUAL_TABLET | SUBLINGUAL | Status: DC | PRN

## 2012-02-14 MED ORDER — ATENOLOL 25 MG PO TABS
25.0000 mg | ORAL_TABLET | Freq: Every morning | ORAL | Status: DC
  Administered 2012-02-15: 25 mg via ORAL
  Filled 2012-02-14: qty 1

## 2012-02-14 MED ORDER — ASPIRIN EC 81 MG PO TBEC
81.0000 mg | DELAYED_RELEASE_TABLET | Freq: Every day | ORAL | Status: DC
  Administered 2012-02-15: 81 mg via ORAL
  Filled 2012-02-14: qty 1

## 2012-02-14 MED ORDER — ACETAMINOPHEN 325 MG PO TABS: 650.0000 mg | ORAL_TABLET | ORAL | Status: DC | PRN

## 2012-02-14 MED ORDER — FINASTERIDE 5 MG PO TABS
5.0000 mg | ORAL_TABLET | Freq: Every day | ORAL | Status: DC
  Administered 2012-02-15: 5 mg via ORAL
  Filled 2012-02-14: qty 1

## 2012-02-14 MED ORDER — ATORVASTATIN CALCIUM 80 MG PO TABS: 80.0000 mg | ORAL_TABLET | Freq: Every day | ORAL | Status: DC

## 2012-02-14 MED ORDER — ZOLPIDEM TARTRATE 5 MG PO TABS
5.0000 mg | ORAL_TABLET | Freq: Every evening | ORAL | Status: DC | PRN
  Administered 2012-02-14: 5 mg via ORAL
  Filled 2012-02-14: qty 1

## 2012-02-14 MED ORDER — HEPARIN (PORCINE) IN NACL 100-0.45 UNIT/ML-% IJ SOLN
1150.0000 [IU]/h | INTRAMUSCULAR | Status: DC
  Administered 2012-02-14: 1150 [IU]/h via INTRAVENOUS
  Filled 2012-02-14: qty 250

## 2012-02-14 NOTE — ED Notes (Signed)
Patient arrived via EMS with chest pain and shortness of breath x 3wks. Patient states he had an MI 3 wks ago and stent placement, his symptoms have continued since then. Patient is A/O x3, pain 5/10.

## 2012-02-14 NOTE — Consult Note (Signed)
ANTICOAGULATION CONSULT NOTE - Initial Consult  Pharmacy Consult for Heparin Indication: chest pain/ACS  No Known Allergies  Patient Measurements: Height: 6' 0.05" (183 cm) Weight: 224 lb 3.3 oz (101.7 kg) IBW/kg (Calculated) : 77.71  Heparin Dosing Weight: ~98kg  Vital Signs: Temp: 98.2 F (36.8 C) (10/31 1545) Temp src: Oral (10/31 1545) BP: 127/89 mmHg (10/31 1820) Pulse Rate: 66  (10/31 1820)  Labs:  [facility] 02/14/12 1625  HGB 14.9  HCT 44.1  PLT 155  APTT --  LABPROT --  INR --  HEPARINUNFRC --  CREATININE 1.05  CKTOTAL --  CKMB --  TROPONINI <0.30    Estimated Creatinine Clearance: 78.5 ml/min (by C-G formula based on Cr of 1.05).   Medical History: Past Medical History  Diagnosis Date  . History of PTCA   . History of cardiac cath   . Hypertension   . Hyperlipidemia   . Depression   . BPH (benign prostatic hypertrophy)   . Anxiety   . GERD (gastroesophageal reflux disease)     Medications:  No anticoagulants pta  Assessment: 71yom with recent DES placed to his posterior descending artery (01/24/12) returns to the ED today with CP and new EKG changes. He will begin heparin. Last admission a heparin bolus of 4000 units and drip at 1100 units/hr resulted in a therapeutic level. CBC and renal function wnl.  Goal of Therapy:  Heparin level 0.3-0.7 units/ml Monitor platelets by anticoagulation protocol: Yes   Plan:  1) Heparin bolus 4000 units x 1 2) Heparin drip at 1150 units/hr 3) 8h heparin level 4) Daily heparin level and CBC  Fredrik Rigger 02/14/2012,7:45 PM

## 2012-02-14 NOTE — ED Notes (Signed)
Called pharmacy and requested to come speak to patient about possible allergy to -statin medications.

## 2012-02-14 NOTE — ED Provider Notes (Signed)
History     CSN: [id number]  Arrival date & time 02/14/12  1530   First MD Initiated Contact with Patient 02/14/12 1548      Chief Complaint  Patient presents with  . Shortness of Breath     Patient is a 71 y.o. male presenting with shortness of breath. The history is provided by the patient.  Shortness of Breath  The current episode started more than 2 weeks ago. The onset was gradual. The problem occurs occasionally. The problem has been gradually worsening. The problem is moderate. The symptoms are relieved by rest. The symptoms are aggravated by activity. Associated symptoms include chest pain and shortness of breath. Pertinent negatives include no orthopnea, no fever and no cough.  Pt presents for chest pain/SOB for several weeks He reports h/o MI several weeks ago.  He reports ever since leaving hospital he has felt SOB with chest pain CP is left upper chest, worse with movement of arms.  No falls/trauma No focal weakness No abd pain No orthopnea He reports he spoke to cardiac rehab nurse today and was advised to come to the ED He came via EMS He reports med compliance since hospital discharge He does report mild diarrhea but it is nonbloody  Past Medical History  Diagnosis Date  . History of PTCA   . History of cardiac cath   . Hypertension   . Hyperlipidemia   . Depression   . BPH (benign prostatic hypertrophy)   . Shortness of breath   . Anxiety   . GERD (gastroesophageal reflux disease)     Past Surgical History  Procedure Date  . Cataract extraction   . Ganglion cyst removed   . Cardiac catheterization 05/11/2005    PCI OF LAD    Family History  Problem Relation Age of Onset  . Heart failure Mother   . Heart failure Brother     History  Substance Use Topics  . Smoking status: Former Smoker    Types: Pipe  . Smokeless tobacco: Never Used  . Alcohol Use: 3.5 oz/week    7 drink(s) per week      Review of Systems  Constitutional: Negative for  fever.  Respiratory: Positive for shortness of breath. Negative for cough.   Cardiovascular: Positive for chest pain. Negative for orthopnea.  All other systems reviewed and are negative.    Allergies  Review of patient's allergies indicates no known allergies.  Home Medications   Current Outpatient Rx  Name Route Sig Dispense Refill  . ATENOLOL 25 MG PO TABS Oral Take 25 mg by mouth every morning.     . ATORVASTATIN CALCIUM 80 MG PO TABS Oral Take 1 tablet (80 mg total) by mouth daily at 6 PM. 30 tablet 11  . FINASTERIDE 5 MG PO TABS Oral Take 5 mg by mouth daily.    Marland Kitchen NITROGLYCERIN 0.4 MG SL SUBL Sublingual Place 0.4 mg under the tongue every 5 (five) minutes as needed. FOR CHEST PAIN    . TICAGRELOR 90 MG PO TABS Oral Take 1 tablet (90 mg total) by mouth 2 (two) times daily. 60 tablet 11    BP 134/76  Pulse 86  Temp 98.2 F (36.8 C) (Oral)  Resp 18  SpO2 97%  Physical Exam CONSTITUTIONAL: Well developed/well nourished HEAD AND FACE: Normocephalic/atraumatic EYES: EOMI/PERRL ENMT: Mucous membranes moist NECK: supple no meningeal signs SPINE:entire spine nontender Chest - tender in left upper chest, no bruising/crepitance CV: S1/S2 noted, no murmurs/rubs/gallops noted LUNGS: Lungs  are clear to auscultation bilaterally, no apparent distress ABDOMEN: soft, nontender, no rebound or guarding GU:no cva tenderness NEURO: Pt is awake/alert, moves all extremitiesx4 EXTREMITIES: pulses normal, full ROM SKIN: warm, color normal PSYCH: no abnormalities of mood noted  ED Course  Procedures   Labs Reviewed  CBC WITH DIFFERENTIAL  BASIC METABOLIC PANEL  TROPONIN I   4:23 PM Pt here with CP/SOB-  Reports constant for several weeks since hospital discharge.  Has not significant worsened today.  No syncope reported.  Reports pain is worse with palpation/movement of his arms.   He does have h/o recent nonstemi with stent placement earlier this month ASA deferred as he is on  brilinta 4:44 PM Pt with abnormal EKG, T wave changes inferiorly, will consult cardiology  5:41 PM D/w dr Donnie Aho, will see patient as he agrees pt with abnormal EKG but negative Trop Pt is pain free at this time. Will defer further anticoagulation No pleuritic pain reported, doubt PE   MDM  Nursing notes including past medical history and social history reviewed and considered in documentation Previous records reviewed and considered xrays reviewed and considered Labs/vital reviewed and considered        Date: 02/14/2012  Rate: 80  Rhythm: normal sinus rhythm  QRS Axis: normal  Intervals: normal  ST/T Wave abnormalities: inverted T waves inferiorly  Conduction Disutrbances:none  Narrative Interpretation:   Old EKG Reviewed: changes noted    Joya Gaskins, MD 02/14/12 [phone]

## 2012-02-14 NOTE — ED Notes (Signed)
Pt reports feeling fine at this time but states that with any movement he becomes SOB and has pain.  Provided pt and family member with meal tray.

## 2012-02-14 NOTE — ED Notes (Signed)
Pts ex wife at bedside states that pt cannot have any of "-tor" medications because it "paralyzes him." Pt was told he was allergic to -statins.

## 2012-02-14 NOTE — H&P (Addendum)
History and Physical   Admit date: 02/14/2012 Name:  Mitchell [name] Medical record number: [id number] DOB/Age:  January 19, 1941  71 y.o. male  Referring Physician:   [facility] Primary Cardiologist: Dr. Verdis Prime Primary Physician: Deboraha Sprang [facility] Chief complaint/reason for admission:  Dyspnea, chest pain and shoulder pain  HPI:  This 71 year old male has a history of hypertension, hyperlipidemia and some anxiety and depression. He had a history of cutting balloon angioplasty of a bifurcation lesion of the LAD about 2 years ago. About 3 weeks ago he was admitted with atypical chest and shoulder pain but had positive cardiac enzymes. He was eventually taken to the cardiac catheterization laboratory were he was found to have subtotal occlusion of the posterior descending artery and had a drug-eluting stent implanted in that. He was discharged home. He has had significant dyspnea since discharge. The dyspnea has been quite problematic for him and he has been seen at Dr. Michaelle Copas office and I was also called on one occasion. The dyspnea is to the point where he can do nothing without significant dyspnea. 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 Today he was talking to the nurse at cardiac rehabilitation and noted him to be dyspneic at home and then he was complaining of a left-sided chest pain like a bruise. He also complained of left shoulder pain but the shoulder pain was worse if he would move his arm and he was found that he would have to support his arm with the pillow or in a sling if he walked. Because of these multiple complaints he presented to the [facility] emergency room. An EKG showed some T wave inversions in the inferior leads that were new from a previous EKG October 11 when he was discharged. He denies PND or orthopnea and has not had significant edema.    Past Medical History    Diagnosis Date  . History of PTCA   . History of cardiac cath   . Hypertension   . Hyperlipidemia   . Depression   . BPH (benign prostatic hypertrophy)   . Anxiety   . GERD (gastroesophageal reflux disease)      Past Surgical History  Procedure Date  . Cataract extraction   . Ganglion cyst removed   . Cardiac catheterization 05/11/2005    PCI OF LAD   Allergies:  has no known allergies.   Medications: Prior to Admission medications   Medication Sig Start Date End Date Taking? Authorizing Provider  atenolol (TENORMIN) 25 MG tablet Take 25 mg by mouth every morning.    Yes Historical Provider, MD  atorvastatin (LIPITOR) 80 MG tablet Take 1 tablet (80 mg total) by mouth daily at 6 PM. 01/25/12  Yes Lyn Records III, MD  finasteride (PROSCAR) 5 MG tablet Take 5 mg by mouth daily.   Yes Historical Provider, MD  nitroGLYCERIN (NITROSTAT) 0.4 MG SL tablet Place 0.4 mg under the tongue every 5 (five) minutes as needed. FOR CHEST PAIN   Yes Historical Provider, MD  Ticagrelor (BRILINTA) 90 MG TABS tablet Take 1 tablet (90 mg total) by mouth 2 (two) times daily. 01/25/12  Yes Lesleigh Noe, MD   Family History:  Family Status  Relation Status Death Age  . Mother Deceased     MI  . Brother Alive     CAD  . Father Deceased     CVA  Social History:   reports that he has quit smoking. His smoking use included Pipe. He has never used smokeless tobacco. He reports that he drinks about 3.5 ounces of alcohol per week. He reports that he does not use illicit drugs.   History   Social History Narrative   Truck driver     Review of Systems: He complains of occasional abdominal pain if he eats fatty food. He does have significant nocturia. He complains of poor vision or he has had previous cataract surgery and implants. His weight has been fluctuating some. Other than as noted above the remainder of the review of systems is normal.  Physical Exam: BP 127/89  Pulse 66  Temp  98.2 F (36.8 C) (Oral)  Resp 14  SpO2 98%  General appearance: alert, cooperative, appears stated age, no distress and mildly obese Head: Normocephalic, without obvious abnormality, atraumatic Eyes: conjunctivae/corneas clear. PERRL, EOM's intact. Fundi not examined Neck: no adenopathy, no carotid bruit, no JVD and supple, symmetrical, trachea midline Lungs: mild wheezing bilaterally Heart: regular rate and rhythm, S1, S2 normal, no murmur, click, rub or gallop Abdomen: soft, non-tender; bowel sounds normal; no masses,  no organomegaly Pelvic: deferred Extremities: extremities normal, atraumatic, no cyanosis or edema Pulses: 2+ and symmetric Skin: Skin color, texture, turgor normal. No rashes or lesions Neurologic: Grossly normal  Labs: CBC  Basename 02/14/12 1625  WBC 8.9  RBC 5.12  HGB 14.9  HCT 44.1  PLT 155  MCV 86.1  MCH 29.1  MCHC 33.8  RDW 13.5  LYMPHSABS 1.7  MONOABS 1.0  EOSABS 0.3  BASOSABS 0.1   CMP   Basename 02/14/12 1625  NA 140  K 3.8  CL 105  CO2 28  GLUCOSE 117*  BUN 19  CREATININE 1.05  CALCIUM 8.8  PROT --  ALBUMIN --  AST --  ALT --  ALKPHOS --  BILITOT --  GFRNONAA 69*  GFRAA 80*   BNP (last 3 results)  Basename 01/22/12 1940 05/01/11 0430 04/29/11 1634  PROBNP 162.4* 391.3* 257.2*   Cardiac Panel (last 3 results)  Basename 02/14/12 1625  CKTOTAL --  CKMB --  TROPONINI <0.30  RELINDX --   EKG: T wave inversions in the inferior leads new from his last EKG done on October 11  Radiology: No acute lung disease   IMPRESSIONS: 1. Chest pain with atypical features that may be unstable angina pectoris 2. Severe dyspnea 3. Coronary artery disease with recent drug-eluting stent placement in the posterior descending artery 4. Hypertension 5. Hyperlipidemia 6. Shoulder pain that is likely arthritic 7. Anxiety and depression  PLAN: The patient has had significant dyspnea that has worsened since his stent implantation. It  may be related to [place]. I will change him to Plavix and we will get serial cardiac enzymes. Continue heparin overnight. I will keep him n.p.o. For possible further testing in the morning.  Signed: Darden Palmer MD [facility]  02/14/2012, 7:25 PM

## 2012-02-14 NOTE — ED Notes (Signed)
Meds ordered from pharmacy.

## 2012-02-14 NOTE — ED Notes (Signed)
Pharmacy at bedside

## 2012-02-15 LAB — HEPARIN LEVEL (UNFRACTIONATED): Heparin Unfractionated: 0.38 IU/mL (ref 0.30–0.70)

## 2012-02-15 LAB — LIPID PANEL
Cholesterol: 94 mg/dL (ref 0–200)
LDL Cholesterol: 42 mg/dL (ref 0–99)
Triglycerides: 39 mg/dL (ref <150)

## 2012-02-15 LAB — TROPONIN I: Troponin I: <0.3 ng/mL (ref <0.30)

## 2012-02-15 MED ORDER — ASPIRIN 81 MG PO TBEC: 81.0000 mg | DELAYED_RELEASE_TABLET | Freq: Every day | ORAL | Status: DC

## 2012-02-15 MED ORDER — IPRATROPIUM-ALBUTEROL 18-103 MCG/ACT IN AERO
2.0000 | INHALATION_SPRAY | Freq: Four times a day (QID) | RESPIRATORY_TRACT | Status: DC
  Filled 2012-02-15: qty 14.7

## 2012-02-15 MED ORDER — LEVALBUTEROL HCL 0.63 MG/3ML IN NEBU
0.6300 mg | INHALATION_SOLUTION | Freq: Three times a day (TID) | RESPIRATORY_TRACT | Status: DC
  Administered 2012-02-15: 0.63 mg via RESPIRATORY_TRACT
  Filled 2012-02-15 (×4): qty 3

## 2012-02-15 MED ORDER — CLOPIDOGREL BISULFATE 75 MG PO TABS: 75.0000 mg | ORAL_TABLET | Freq: Every day | ORAL | Status: DC

## 2012-02-15 MED ORDER — IPRATROPIUM-ALBUTEROL 18-103 MCG/ACT IN AERO: 2.0000 | INHALATION_SPRAY | Freq: Four times a day (QID) | RESPIRATORY_TRACT | Status: DC

## 2012-02-15 NOTE — Progress Notes (Signed)
SUBJECTIVE:  SHOB better this AM.  [place] pain better as well.    OBJECTIVE:   Vitals:   Filed Vitals:   02/14/12 1945 02/14/12 2007 02/14/12 2033 02/15/12 0614  BP: 93/64  148/84 129/85  Pulse: 78 63 64 59  Temp:   98.2 F (36.8 C) 98 F (36.7 C)  TempSrc:   Oral Oral  Resp: 21 12 19 20   Height:   6' (1.829 m)   Weight:   101.691 kg (224 lb 3 oz)   SpO2: 95% 99% 97% 98%   I&O's:   Intake/Output Summary (Last 24 hours) at 02/15/12 0845 Last data filed at 02/15/12 0600  Gross per 24 hour  Intake      0 ml  Output    350 ml  Net   -350 ml   TELEMETRY: Reviewed telemetry pt in NSR:     PHYSICAL EXAM General: Well developed, well nourished, in no acute distress Head:    Normal cephalic and atramatic  Lungs:  Bilateral wheezing Heart:   HRRR S1 S2  Abdomen: obese Msk:   Normal strength and tone for age. Extremities:  No edema.  DP +1 Neuro: Alert and oriented X 3. Psych:  Good affect, responds appropriately   LABS: Basic Metabolic Panel:  Basename 02/14/12 1955 02/14/12 1625  NA 141 140  K 3.5 3.8  CL 105 105  CO2 24 28  GLUCOSE 144* 117*  BUN 19 19  CREATININE 0.95 1.05  CALCIUM 8.9 8.8  MG -- --  PHOS -- --   Liver Function Tests:  [facility] 02/14/12 1955  AST 18  ALT 16  ALKPHOS 76  BILITOT 0.3  PROT 6.1  ALBUMIN 3.4*   No results found for this basename: LIPASE:2,AMYLASE:2 in the last 72 hours CBC:  Basename 02/14/12 1625  WBC 8.9  NEUTROABS 5.8  HGB 14.9  HCT 44.1  MCV 86.1  PLT 155   Cardiac Enzymes:  Basename 02/15/12 0131 02/14/12 1955 02/14/12 1625  CKTOTAL -- -- --  CKMB -- -- --  CKMBINDEX -- -- --  TROPONINI <0.30 <0.30 <0.30   BNP: No components found with this basename: POCBNP:3 D-Dimer:  [facility] 02/14/12 1955  DDIMER 0.52*   Hemoglobin A1C: No results found for this basename: HGBA1C in the last 72 hours Fasting Lipid Panel:  Basename 02/15/12 0525  CHOL 94  HDL 44  LDLCALC 42  TRIG 39  CHOLHDL 2.1    LDLDIRECT --   Thyroid Function Tests:  Basename 02/14/12 1955  TSH 0.946  T4TOTAL --  T3FREE --  THYROIDAB --   Anemia Panel: No results found for this basename: VITAMINB12,FOLATE,FERRITIN,TIBC,IRON,RETICCTPCT in the last 72 hours Coag Panel:   Lab Results  Component Value Date   INR 0.99 02/14/2012   INR 1.03 01/23/2012    RADIOLOGY: Ct Angio Chest Pe W/cm &/or Wo Cm  01/22/2012  *RADIOLOGY REPORT*  Clinical Data: 71 year old male with shortness of breath and chest pain.  CT ANGIOGRAPHY CHEST  Technique:  Multidetector CT imaging of the chest using the standard protocol during bolus administration of intravenous contrast. Multiplanar reconstructed images including MIPs were obtained and reviewed to evaluate the vascular anatomy.  Contrast: 65mL OMNIPAQUE IOHEXOL 350 MG/ML SOLN  Comparison: 04/29/2011.  Findings: Suboptimal contrast bolus timing in the pulmonary arterial tree.  Fortunately there is very little respiratory motion. No focal filling defect identified in the pulmonary arterial tree to suggest the presence of acute pulmonary embolism.  Atelectatic changes to the major airways.  Increased dependent opacity compatible with atelectasis.  No other abnormal pulmonary opacity.  Negative thoracic inlet.  Mediastinal lipomatosis.  No mediastinal lymphadenopathy.  Coronary artery atherosclerosis.  No pericardial or pleural effusion.  Cholelithiasis.  Other visualized upper abdominal viscera are negative.  Chronic lower sternal fracture. No acute osseous abnormality identified.  IMPRESSION: 1. No evidence of acute pulmonary embolus. 2.  Pulmonary atelectasis.  Coronary artery atherosclerosis. Cholelithiasis.   Original Report Authenticated By: Harley Hallmark, M.D.    Dg Chest Port 1 View  02/14/2012  *RADIOLOGY REPORT*  Clinical Data: Chest pain, no acute injury, former smoking history  PORTABLE CHEST - 1 VIEW  Comparison: CT angio chest of 01/22/2012 and chest x-ray of the same date   Findings: No active infiltrate or effusion is seen.  Mediastinal contours appear normal.  The heart is within normal limits in size. No bony abnormality is seen.  IMPRESSION: No active lung disease.   Original Report Authenticated By: Dwyane Dee, M.D.    Dg Chest Portable 1 View  01/22/2012   *RADIOLOGY REPORT*  Clinical Data: Shortness of breath, chest pressure  CHEST - 1 VIEW  Comparison:  04/29/2011  Findings: The heart size and mediastinal contours are within normal limits.  Both lungs are clear.  IMPRESSION: No active disease.   Original Report Authenticated By: Judie Petit. Ruel Favors, M.D.       ASSESSMENTLennette [name], CAD  PLAN:  Sx better.  Ruled out for MI.  WIll stop heparin.  Start Xopenex.  He had a hospital admission in Jan where he got better with nebs, per his report. Required home oxygen at that time. No pulmonary edema by CXR.  Ambulate in halls.  If feeling better, may let him go home later today.  Clear liquids for now in the event that cath is needed later.  He was sent home on Albuterol adn atrovent at that time but is no longer taking this medicine.  Corky Crafts., MD  02/15/2012  8:45 AM

## 2012-02-15 NOTE — Consult Note (Signed)
ANTICOAGULATION CONSULT NOTE - Follow-Up Consult  Pharmacy Consult for Heparin Indication: chest pain/ACS  Allergies  Allergen Reactions  . Lipitor (Atorvastatin) Other (See Comments)    "just paralyzed me; I can't take it"  . Statins Other (See Comments)    "paralyzed me; I can't take it"    Patient Measurements: Height: 6' (182.9 cm) Weight: 224 lb 3 oz (101.691 kg) IBW/kg (Calculated) : 77.6  Heparin Dosing Weight: ~98kg  Vital Signs: Temp: 98 F (36.7 C) (11/01 0614) Temp src: Oral (11/01 0614) BP: 129/85 mmHg (11/01 0614) Pulse Rate: 59  (11/01 0614)  Labs:  Basename 02/15/12 0525 02/15/12 0131 02/14/12 1955 02/14/12 1625  HGB -- -- -- 14.9  HCT -- -- -- 44.1  PLT -- -- -- 155  APTT -- -- 32 --  LABPROT -- -- 13.0 --  INR -- -- 0.99 --  HEPARINUNFRC 0.38 -- -- --  CREATININE -- -- 0.95 1.05  CKTOTAL -- -- -- --  CKMB -- -- -- --  TROPONINI -- <0.30 <0.30 <0.30    Estimated Creatinine Clearance: 86.7 ml/min (by C-G formula based on Cr of 0.95).  Assessment: 71yom with recent DES placed to his posterior descending artery (01/24/12) returns to the ED with CP and new EKG changes. Heparin level therapeutic on 1150 units/hr. No bleeding noted.  Goal of Therapy:  Heparin level 0.3-0.7 units/ml Monitor platelets by anticoagulation protocol: Yes   Plan:  1) Continue Heparin drip at 1150 units/hr 2) Daily heparin level and CBC  Christoper Fabian, PharmD, BCPS Clinical pharmacist, pager [phone] 02/15/2012,6:28 AM

## 2012-02-15 NOTE — Progress Notes (Signed)
Pt ready for discharge. Completed pt's discharge education along with prescriptions. Pt understands the importance of taking his plavix daily. Pt is stable for discharge with daughter. Will wheel pt out to car.

## 2012-02-15 NOTE — Discharge Summary (Signed)
Patient ID: Mitchell [name] MRN: [id number] DOB/AGE: January 17, 1941 71 y.o.  Admit date: 02/14/2012 Discharge date: 02/15/2012  Primary Discharge Diagnosis Shortness of breath Secondary Discharge Diagnosis CAD  Significant Diagnostic Studies: CXR- no pulmonary edema  Consults: None  Hospital Course: 71 y/o who had a PDA stent 3 weeks ago.  He was on Brilinta.  He had been taking this antiplatelet drug since his stent was placed.  He came to the office and had some shortness of breath last week.  The plan was to keep him on Brilinta for 6 weeks and then changing to Plavix.  Last night, when he came in, he was switched to Plavix.  This morning, he notes that his shortness of breath is better.  He still had some wheezing on exam.  Review of the medical history showed that in January, he was sent home with prescriptions for albuterol and Atrovent.  He states that he was given into the canister is a never used this medication chronically.  Since receiving a Xopenex nebulizer treatment, he is feeling much better.  He has walked in the halls without any chest discomfort.  His left shoulder feels better.  He thinks it is the medication that was bothering him.  He would  Like to go home.    Discharge Exam: Blood pressure 138/74, pulse 77, temperature 98 F (36.7 C), temperature source Oral, resp. rate 18, height 6' (1.829 m), weight 101.691 kg (224 lb 3 oz), SpO2 99.00%.   [place]/AT RRR, S1-S2  Scant bilateral wheezing Obese  No edema  Labs:   Lab Results  Component Value Date   WBC 8.9 02/14/2012   HGB 14.9 02/14/2012   HCT 44.1 02/14/2012   MCV 86.1 02/14/2012   PLT 155 02/14/2012    Lab 02/14/12 1955  NA 141  K 3.5  CL 105  CO2 24  BUN 19  CREATININE 0.95  CALCIUM 8.9  PROT 6.1  BILITOT 0.3  ALKPHOS 76  ALT 16  AST 18  GLUCOSE 144*   Lab Results  Component Value Date   CKTOTAL 219 04/30/2011   CKMB 3.8 04/30/2011   TROPONINI <0.30 02/15/2012    Lab Results  Component Value  Date   CHOL 94 02/15/2012   CHOL 175 01/23/2012   CHOL 187 05/01/2011   Lab Results  Component Value Date   HDL 44 02/15/2012   HDL 51 40/12/8117   HDL 59 1/47/8295   Lab Results  Component Value Date   LDLCALC 42 02/15/2012   LDLCALC 111* 01/23/2012   LDLCALC 113* 05/01/2011   Lab Results  Component Value Date   TRIG 39 02/15/2012   TRIG 63 01/23/2012   TRIG 76 05/01/2011   Lab Results  Component Value Date   CHOLHDL 2.1 02/15/2012   CHOLHDL 3.4 01/23/2012   CHOLHDL 3.2 05/01/2011   No results found for this basename: LDLDIRECT      Radiology: No pulmonary edema on chest x-ray  AOZ:HYQMVH sinus rhythm, inferior T wave inversions  FOLLOW UP PLANS AND APPOINTMENTS Discharge Orders    Future Appointments: Provider: Department: Dept Phone: Center:   02/20/2012 1:00 PM Colonel Bald, OT [facility] [phone] [facility]       Medication List     As of 02/15/2012  1:58 PM    STOP taking these medications         atorvastatin 80 MG tablet   Commonly known as: LIPITOR      TAKE these medications  albuterol-ipratropium 18-103 MCG/ACT inhaler   Commonly known as: COMBIVENT   Inhale 2 puffs into the lungs every 6 (six) hours.      aspirin 81 MG EC tablet   Take 1 tablet (81 mg total) by mouth daily.      atenolol 25 MG tablet   Commonly known as: TENORMIN   Take 25 mg by mouth every morning.      clopidogrel 75 MG tablet   Commonly known as: PLAVIX   Take 1 tablet (75 mg total) by mouth daily with breakfast.      finasteride 5 MG tablet   Commonly known as: PROSCAR   Take 5 mg by mouth daily.      nitroGLYCERIN 0.4 MG SL tablet   Commonly known as: NITROSTAT   Place 0.4 mg under the tongue every 5 (five) minutes as needed. FOR CHEST PAIN      Ticagrelor 90 MG Tabs tablet   Commonly known as: BRILINTA   Take 1 tablet (90 mg total) by mouth 2 (two) times daily.         BRING ALL MEDICATIONS WITH YOU TO FOLLOW UP APPOINTMENTS  Time spent with  patient to include physician time:40 minutes, going over medication changes and coordinating a way from the get his medicines  Signed: VARANASI,JAYADEEP S. 02/15/2012, 1:58 PM

## 2012-02-15 NOTE — Progress Notes (Signed)
Pt ambulated with standby assistance approx 550 feet. Pt tolerated well. No chest pain, slight SOB towards end of walk. Pt's O2 sats were 96% on room air while ambulating.

## 2012-02-15 NOTE — Care Management Note (Signed)
    Page 1 of 1   02/18/2012     3:26:04 PM   CARE MANAGEMENT NOTE 02/18/2012  Patient:  Mitchell [name], Mitchell [name]   Account Number:  [id number]  Date Initiated:  02/15/2012  Documentation initiated by:  Drystan Reader  Subjective/Objective Assessment:   PT ADM WITH CP, SOB S/P STENT PLACEMENT 3 WEEKS AGO.  PTA, PT LIVES ALONE AND IS INDEPENDENT.     Action/Plan:   WILL FOLLOW FOR HOME NEEDS AS PT PROGRESSES.   Anticipated DC Date:  02/15/2012   Anticipated DC Plan:  HOME/SELF CARE         Choice offered to / List presented to:             Status of service:  Completed, signed off Medicare Important Message given?   (If response is "NO", the following Medicare IM given date fields will be blank) Date Medicare IM given:   Date Additional Medicare IM given:    Discharge Disposition:  HOME/SELF CARE  Per UR Regulation:  Reviewed for med. necessity/level of care/duration of stay  If discussed at Long Length of Stay Meetings, dates discussed:    Comments:

## 2012-02-15 NOTE — Progress Notes (Signed)
Pt received breathing treatment then ambulated approx. 500 feet. Pt did have expiratory wheezing while ambulating and when resting. Pt did not have chest pain and stated that his SOB was better than when he came in. Will continue to monitor.

## 2012-02-20 ENCOUNTER — Ambulatory Visit: Payer: Medicare HMO | Attending: Ophthalmology | Admitting: Occupational Therapy

## 2012-02-20 DIAGNOSIS — H53419 Scotoma involving central area, unspecified eye: Secondary | ICD-10-CM | POA: Insufficient documentation

## 2012-02-20 DIAGNOSIS — IMO0001 Reserved for inherently not codable concepts without codable children: Secondary | ICD-10-CM | POA: Insufficient documentation

## 2012-02-20 DIAGNOSIS — H3530 Unspecified macular degeneration: Secondary | ICD-10-CM | POA: Insufficient documentation

## 2012-07-14 ENCOUNTER — Emergency Department (HOSPITAL_COMMUNITY): Payer: Medicare HMO

## 2012-07-14 ENCOUNTER — Encounter (HOSPITAL_COMMUNITY): Payer: Self-pay

## 2012-07-14 ENCOUNTER — Emergency Department (HOSPITAL_COMMUNITY)
Admission: EM | Admit: 2012-07-14 | Discharge: 2012-07-14 | Disposition: A | Discharge status: Home / Self Care | Payer: Medicare HMO | Attending: Emergency Medicine | Admitting: Emergency Medicine

## 2012-07-14 DIAGNOSIS — R059 Cough, unspecified: Secondary | ICD-10-CM | POA: Insufficient documentation

## 2012-07-14 DIAGNOSIS — N400 Enlarged prostate without lower urinary tract symptoms: Secondary | ICD-10-CM | POA: Insufficient documentation

## 2012-07-14 DIAGNOSIS — Z7902 Long term (current) use of antithrombotics/antiplatelets: Secondary | ICD-10-CM | POA: Insufficient documentation

## 2012-07-14 DIAGNOSIS — J441 Chronic obstructive pulmonary disease with (acute) exacerbation: Secondary | ICD-10-CM | POA: Insufficient documentation

## 2012-07-14 DIAGNOSIS — I2510 Atherosclerotic heart disease of native coronary artery without angina pectoris: Secondary | ICD-10-CM | POA: Insufficient documentation

## 2012-07-14 DIAGNOSIS — R079 Chest pain, unspecified: Secondary | ICD-10-CM | POA: Insufficient documentation

## 2012-07-14 DIAGNOSIS — Z7982 Long term (current) use of aspirin: Secondary | ICD-10-CM | POA: Insufficient documentation

## 2012-07-14 DIAGNOSIS — Z8719 Personal history of other diseases of the digestive system: Secondary | ICD-10-CM | POA: Insufficient documentation

## 2012-07-14 DIAGNOSIS — Z8639 Personal history of other endocrine, nutritional and metabolic disease: Secondary | ICD-10-CM | POA: Insufficient documentation

## 2012-07-14 DIAGNOSIS — Z79899 Other long term (current) drug therapy: Secondary | ICD-10-CM | POA: Insufficient documentation

## 2012-07-14 DIAGNOSIS — Z9861 Coronary angioplasty status: Secondary | ICD-10-CM | POA: Insufficient documentation

## 2012-07-14 DIAGNOSIS — I252 Old myocardial infarction: Secondary | ICD-10-CM | POA: Insufficient documentation

## 2012-07-14 DIAGNOSIS — Z8679 Personal history of other diseases of the circulatory system: Secondary | ICD-10-CM | POA: Insufficient documentation

## 2012-07-14 DIAGNOSIS — Z862 Personal history of diseases of the blood and blood-forming organs and certain disorders involving the immune mechanism: Secondary | ICD-10-CM | POA: Insufficient documentation

## 2012-07-14 DIAGNOSIS — R05 Cough: Secondary | ICD-10-CM | POA: Insufficient documentation

## 2012-07-14 DIAGNOSIS — Z8659 Personal history of other mental and behavioral disorders: Secondary | ICD-10-CM | POA: Insufficient documentation

## 2012-07-14 DIAGNOSIS — I10 Essential (primary) hypertension: Secondary | ICD-10-CM | POA: Insufficient documentation

## 2012-07-14 DIAGNOSIS — Z87891 Personal history of nicotine dependence: Secondary | ICD-10-CM | POA: Insufficient documentation

## 2012-07-14 LAB — CBC WITH DIFFERENTIAL/PLATELET
Eosinophils Absolute: 0.5 10*3/uL (ref 0.0–0.7)
Hemoglobin: 16 g/dL (ref 13.0–17.0)
Lymphs Abs: 1.7 10*3/uL (ref 0.7–4.0)
MCH: 29 pg (ref 26.0–34.0)
Neutro Abs: 5.2 10*3/uL (ref 1.7–7.7)
Neutrophils Relative %: 61 % (ref 43–77)
Platelets: 145 10*3/uL — ABNORMAL LOW (ref 150–400)
RBC: 5.51 MIL/uL (ref 4.22–5.81)
WBC: 8.6 10*3/uL (ref 4.0–10.5)

## 2012-07-14 LAB — COMPREHENSIVE METABOLIC PANEL
ALT: 15 U/L (ref 0–53)
Albumin: 3.5 g/dL (ref 3.5–5.2)
Alkaline Phosphatase: 69 U/L (ref 39–117)
Chloride: 104 mEq/L (ref 96–112)
GFR calc Af Amer: >90 mL/min (ref >90)
Glucose, Bld: 124 mg/dL — ABNORMAL HIGH (ref 70–99)
Potassium: 3.8 mEq/L (ref 3.5–5.1)
Sodium: 139 mEq/L (ref 135–145)
Total Bilirubin: 0.3 mg/dL (ref 0.3–1.2)
Total Protein: 6.4 g/dL (ref 6.0–8.3)

## 2012-07-14 MED ORDER — ALBUTEROL SULFATE (2.5 MG/3ML) 0.083% IN NEBU: 2.5000 mg | INHALATION_SOLUTION | Freq: Four times a day (QID) | RESPIRATORY_TRACT | Status: DC | PRN

## 2012-07-14 MED ORDER — IPRATROPIUM BROMIDE 0.02 % IN SOLN
0.5000 mg | RESPIRATORY_TRACT | Status: DC
  Administered 2012-07-14: 0.5 mg via RESPIRATORY_TRACT
  Filled 2012-07-14 (×2): qty 2.5

## 2012-07-14 MED ORDER — ALBUTEROL SULFATE (5 MG/ML) 0.5% IN NEBU: 2.5000 mg | INHALATION_SOLUTION | Freq: Once | RESPIRATORY_TRACT | Status: DC

## 2012-07-14 MED ORDER — PREDNISONE 10 MG PO TABS: 60.0000 mg | ORAL_TABLET | Freq: Every day | ORAL | Status: DC

## 2012-07-14 MED ORDER — ALBUTEROL SULFATE (5 MG/ML) 0.5% IN NEBU
2.5000 mg | INHALATION_SOLUTION | RESPIRATORY_TRACT | Status: DC
  Administered 2012-07-14: 2.5 mg via RESPIRATORY_TRACT
  Filled 2012-07-14: qty 0.5
  Filled 2012-07-14: qty 1
  Filled 2012-07-14: qty 0.5

## 2012-07-14 MED ORDER — AEROCHAMBER PLUS FLO-VU MEDIUM MISC
1.0000 | Freq: Once | Status: DC
  Filled 2012-07-14: qty 1

## 2012-07-14 MED ORDER — MOXIFLOXACIN HCL 400 MG PO TABS: 400.0000 mg | ORAL_TABLET | Freq: Every day | ORAL | Status: DC

## 2012-07-14 MED ORDER — IPRATROPIUM BROMIDE 0.02 % IN SOLN: 0.5000 mg | Freq: Once | RESPIRATORY_TRACT | Status: DC

## 2012-07-14 NOTE — ED Notes (Signed)
Patient remained at 94% O2 while ambulating

## 2012-07-14 NOTE — ED Provider Notes (Signed)
History     CSN: [id number]  Arrival date & time 07/14/12  1547   First MD Initiated Contact with Patient 07/14/12 1550      No chief complaint on file.   (Consider location/radiation/quality/duration/timing/severity/associated sxs/prior treatment) Patient is a 72 y.o. male presenting with shortness of breath. The history is provided by the patient.  Shortness of Breath Severity:  Severe Onset quality:  Gradual Duration:  2 weeks Timing:  Constant Progression:  Worsening Context: URI   Relieved by:  Rest and oxygen Worsened by:  Activity Ineffective treatments:  Inhaler Associated symptoms: chest pain, cough and sputum production   Associated symptoms: no abdominal pain, no fever, no rash and no vomiting   Cough:    Cough characteristics:  Productive   Past Medical History  Diagnosis Date  . Hypertension   . Hyperlipidemia   . Depression   . BPH (benign prostatic hypertrophy)   . Anxiety   . GERD (gastroesophageal reflux disease)   . Coronary artery disease   . Myocardial infarction 01/22/2012  . Anginal pain 01/2012  . COPD (chronic obstructive pulmonary disease)     "maybe" (02/14/2012)  . Shortness of breath     "walking and laying down sometimes" (02/14/2012)    Past Surgical History  Procedure Laterality Date  . Ganglion cyst removed  1956?    right  . Coronary angioplasty with stent placement  05/11/2005    PCI OF LAD  . Cataract extraction w/ intraocular lens  implant, bilateral  ~ 2010  . Refractive surgery  2010    left    Family History  Problem Relation Age of Onset  . Heart failure Mother   . Heart failure Brother     History  Substance Use Topics  . Smoking status: Former Smoker -- 42 years    Types: Pipe    Quit date: 04/28/2011  . Smokeless tobacco: Never Used  . Alcohol Use: 3.5 oz/week    7 drink(s) per week     Comment: 02/14/2012 "weekends I have 6-7 mixed drinks"      Review of Systems  Constitutional: Negative for fever.   HENT: Negative for congestion, facial swelling and trouble swallowing.   Respiratory: Positive for cough, sputum production and shortness of breath.   Cardiovascular: Positive for chest pain.  Gastrointestinal: Negative for nausea, vomiting, abdominal pain and diarrhea.  Genitourinary: Negative for difficulty urinating.  Skin: Negative for rash.  All other systems reviewed and are negative.    Allergies  Lipitor and Statins  Home Medications   Current Outpatient Rx  Name  Route  Sig  Dispense  Refill  . albuterol-ipratropium (COMBIVENT) 18-103 MCG/ACT inhaler   Inhalation   Inhale 2 puffs into the lungs every 6 (six) hours.   1 Inhaler   6   . aspirin EC 81 MG EC tablet   Oral   Take 1 tablet (81 mg total) by mouth daily.         Marland Kitchen atenolol (TENORMIN) 25 MG tablet   Oral   Take 25 mg by mouth every morning.          . clopidogrel (PLAVIX) 75 MG tablet   Oral   Take 1 tablet (75 mg total) by mouth daily with breakfast.   30 tablet   11   . finasteride (PROSCAR) 5 MG tablet   Oral   Take 5 mg by mouth daily.         . nitroGLYCERIN (NITROSTAT) 0.4 MG SL  tablet   Sublingual   Place 0.4 mg under the tongue every 5 (five) minutes as needed. FOR CHEST PAIN         . Ticagrelor (BRILINTA) 90 MG TABS tablet   Oral   Take 1 tablet (90 mg total) by mouth 2 (two) times daily.   60 tablet   11     BP 164/84  Pulse 79  Temp(Src) 98.2 F (36.8 C)  Resp 14  SpO2 94%  Physical Exam  Nursing note and vitals reviewed. Constitutional: He is oriented to person, place, and time. He appears well-developed and well-nourished. No distress.  HENT:  Head: Normocephalic and atraumatic.  Mouth/Throat: Oropharynx is clear and moist.  Eyes: Conjunctivae are normal. Pupils are equal, round, and reactive to light. No scleral icterus.  Neck: Normal range of motion. Neck supple.  Cardiovascular: Normal rate, regular rhythm, normal heart sounds and intact distal pulses.    No murmur heard. Pulmonary/Chest: Effort normal. No stridor. No respiratory distress. He has decreased breath sounds in the left middle field and the left lower field. He has wheezes (diffuse).  Abdominal: Soft. He exhibits no distension. There is no tenderness.  Musculoskeletal: Normal range of motion. He exhibits edema (1+ pitting).  Neurological: He is alert and oriented to person, place, and time.  Skin: Skin is warm and dry. No rash noted.  Psychiatric: He has a normal mood and affect. His behavior is normal.    ED Course  Procedures (including critical care time)  Labs Reviewed  CBC WITH DIFFERENTIAL - Abnormal; Notable for the following:    Platelets 145 (*)    Monocytes Absolute 1.1 (*)    Eosinophils Relative 6 (*)    All other components within normal limits  COMPREHENSIVE METABOLIC PANEL - Abnormal; Notable for the following:    Glucose, Bld 124 (*)    BUN 25 (*)    GFR calc non Af Amer 81 (*)    All other components within normal limits  PRO B NATRIURETIC PEPTIDE - Abnormal; Notable for the following:    Pro B Natriuretic peptide (BNP) 166.6 (*)    All other components within normal limits  TROPONIN I   Dg Chest 2 View  07/14/2012  *RADIOLOGY REPORT*  Clinical Data: Chest pain.  Fever.  Cough.  Shortness of breath. Current history of hypertension.  CHEST - 2 VIEW  Comparison: Portable chest x-ray 02/14/2012, 01/22/2012.  CTA chest 01/22/2012, 04/29/2011.  Findings: Cardiac silhouette normal in size, unchanged.  Thoracic aorta mildly atherosclerotic, unchanged.  Hilar and mediastinal contours otherwise unremarkable.  Lungs clear.  Bronchovascular markings normal.  Pulmonary vascularity normal.  No pneumothorax. No pleural effusions.  Degenerative changes involving the thoracic spine.  IMPRESSION: No acute cardiopulmonary disease.   Original Report Authenticated By: Hulan Saas, M.D.   All radiology studies independently viewed by me.      Date: 07/14/2012  Rate: 78   Rhythm: normal sinus rhythm  QRS Axis: normal  Intervals: normal  ST/T Wave abnormalities: nonspecific T wave changes  Conduction Disutrbances:none  Narrative Interpretation: nonspecific t wave changes have resolved in lateral leads and are now present in aVL.  Old EKG Reviewed: changes noted   1. COPD exacerbation       MDM   2 weeks of worsening shortness of breath.  EMS reported diffuse wheezing and respiratory distress which has improved after albuterol and solumedrol given PTA.  Still has some wheezing, but is not tachypneic or hypoxic on initial exam.  He reports productive cough as well.  Plan CXR, labs, and duoneb.  He has mild BLE edema, but last echo showed good cardiac function.  Most likely illness secondary to COPD exacerbation +/- pneumonia.  Story not consistent with ACS.    5:59 PM on re-exam, pt was moving better air and was without wheezes.  Plan to ambulate.  He endorsed left upper chest pain and was significantly tender to palpation here.  Pain occurs when coughing.  Still not c/w ACS.  Labwork is unremarkable.       Rennis Petty, MD 07/14/12 2101

## 2012-07-14 NOTE — ED Notes (Signed)
Densel Kronick (Son) left [phone] as contact number

## 2012-07-14 NOTE — ED Provider Notes (Signed)
72 year old male with a long standing history of tobacco use who stopped approximately a year and a half ago but has since been diagnosed with some degree of obstructive pulmonary disease. He presents with increased dyspnea over the last 24 hours to use had increased shortness of breath over a month, this is worse with exertion, worse with coughing and when he gets upper respiratory symptoms his cough seems to get worse with shortness of breath. He denies any significant swelling or asymmetry of his lower extremities and denies any chest pain to me at this time. He has had increased cough over the last week.  On exam the patient has diffuse mild expiratory wheezing with forced expiration only over his posterior lung fields, no increased work of breathing, no rales, oxygen saturations of 95-96% on room air, minimal edema bilaterally with no asymmetry, no rashes, normal mental status. He is not tachycardic, tachypneic and does not appear to be in distress.  I personally interpreted his two-view chest x-ray PA and lateral views which shows no signs of focal infiltrates pneumothorax or abnormal mediastinum. The patient would likely benefit from further neb treatments, home with a spacer for his metered-dose inhaler, prednisone therapy over the next 5 days and followup with his family doctor. At this time is not warranted home oxygen use though he states that he wants it for comfort reasons only. He will be referred back to Dr. Tenny Craw, his family doctor.  I agree with the residents ECG evaluation.  I saw and evaluated the patient, reviewed the resident's note and I agree with the findings and plan.   Vida Roller, MD 07/14/12 [phone]

## 2012-07-15 NOTE — ED Provider Notes (Signed)
Medical screening examination/treatment/procedure(s) were conducted as a shared visit with resident physician practitioner(s) and myself.  I personally evaluated the patient during the encounter   I saw and evaluated the patient, reviewed the resident's note and I agree with the findings and plan.  Please see separate documentation regarding my interaction with pt  Vida Roller, MD 07/15/12 [phone]

## 2013-02-02 ENCOUNTER — Other Ambulatory Visit: Payer: Self-pay | Admitting: *Deleted

## 2013-02-02 DIAGNOSIS — E7800 Pure hypercholesterolemia, unspecified: Secondary | ICD-10-CM

## 2013-02-25 ENCOUNTER — Other Ambulatory Visit: Payer: Medicare HMO

## 2013-02-25 ENCOUNTER — Ambulatory Visit: Payer: Medicare HMO | Admitting: Interventional Cardiology

## 2013-04-30 ENCOUNTER — Ambulatory Visit (INDEPENDENT_AMBULATORY_CARE_PROVIDER_SITE_OTHER): Payer: Commercial Managed Care - HMO | Admitting: Interventional Cardiology

## 2013-04-30 ENCOUNTER — Encounter: Payer: Self-pay | Admitting: Interventional Cardiology

## 2013-04-30 ENCOUNTER — Other Ambulatory Visit: Payer: Medicare HMO

## 2013-04-30 VITALS — BP 120/80 | HR 54 | Ht 72.0 in | Wt 226.0 lb

## 2013-04-30 DIAGNOSIS — I10 Essential (primary) hypertension: Secondary | ICD-10-CM

## 2013-04-30 DIAGNOSIS — J449 Chronic obstructive pulmonary disease, unspecified: Secondary | ICD-10-CM | POA: Diagnosis not present

## 2013-04-30 DIAGNOSIS — J4489 Other specified chronic obstructive pulmonary disease: Secondary | ICD-10-CM | POA: Diagnosis not present

## 2013-04-30 DIAGNOSIS — E7800 Pure hypercholesterolemia, unspecified: Secondary | ICD-10-CM

## 2013-04-30 DIAGNOSIS — E785 Hyperlipidemia, unspecified: Secondary | ICD-10-CM

## 2013-04-30 DIAGNOSIS — I2510 Atherosclerotic heart disease of native coronary artery without angina pectoris: Secondary | ICD-10-CM | POA: Diagnosis not present

## 2013-04-30 LAB — HEPATIC FUNCTION PANEL
ALBUMIN: 3.6 g/dL (ref 3.5–5.2)
ALT: 19 U/L (ref 0–53)
AST: 18 U/L (ref 0–37)
Alkaline Phosphatase: 53 U/L (ref 39–117)
BILIRUBIN TOTAL: 0.7 mg/dL (ref 0.3–1.2)
Bilirubin, Direct: 0.1 mg/dL (ref 0.0–0.3)
Total Protein: 6.1 g/dL (ref 6.0–8.3)

## 2013-04-30 LAB — LIPID PANEL
CHOLESTEROL: 167 mg/dL (ref 0–200)
HDL: 64.9 mg/dL (ref >39.00)
LDL CALC: 87 mg/dL (ref 0–99)
TRIGLYCERIDES: 75 mg/dL (ref 0.0–149.0)
Total CHOL/HDL Ratio: 3
VLDL: 15 mg/dL (ref 0.0–40.0)

## 2013-04-30 NOTE — Progress Notes (Signed)
Patient ID: Mitchell BossierJarald Dean Kiper, male   DOB: April 29, 1940, [age] y.o.   MRN: [id number]    [address] [place], [place]  [address] Phone: [phone] Fax:  [phone]  Date:  04/30/2013   ID:  Mitchell BossierJarald Dean Ramser, DOB April 29, 1940, MRN [id number]  PCP:  Default, Provider, MD   ASSESSMENT:  1. CAD with prior right coronary DES and LAD diagonal cutting balloon angioplasty, remote. He is stable without angina 2. COPD, treated by primary physician Dr. Janalyn Rouseawls 3. Hypertension under good control 4. Hyperlipidemia on therapy  PLAN:  1. The patient is doing well and I recommended that he engage in physical activity as tolerated. 2. He is encouraged to call if he begins having angina or any evidence of heart failure such as lower extremity swelling or orthopnea. 3. I will otherwise plan to see him back in 12 months unless cardiovascular symptoms develop.   SUBJECTIVE: Mitchell BossierJarald Dean Glosser is a 73 y.o. male who has COPD, anxiety disorder, hypertension, and COPD. He had dyspnea about one month ago and saw his primary physician he started antibiotics. This led to resolution and the thought that the dyspnea was COPD/bronchitis related. He has not had angina. He denies nitroglycerin use. There is no orthopnea, PND, or peripheral edema.   Wt Readings from Last 3 Encounters:  04/30/13 226 lb (102.513 kg)  02/14/12 224 lb 3 oz (101.691 kg)  01/25/12 224 lb 3.3 oz (101.7 kg)     Past Medical History  Diagnosis Date  . Hypertension   . Hyperlipidemia   . Depression   . BPH (benign prostatic hypertrophy)   . Anxiety   . GERD (gastroesophageal reflux disease)   . Coronary artery disease   . Myocardial infarction 01/22/2012  . Anginal pain 01/2012  . COPD (chronic obstructive pulmonary disease)     "maybe" (02/14/2012)  . Shortness of breath     "walking and laying down sometimes" (02/14/2012)    Current Outpatient Prescriptions  Medication Sig Dispense Refill  . albuterol (PROVENTIL  HFA;VENTOLIN HFA) 108 (90 BASE) MCG/ACT inhaler Inhale 2 puffs into the lungs every 6 (six) hours as needed for wheezing. For shortness of breath      . albuterol (PROVENTIL) (2.5 MG/3ML) 0.083% nebulizer solution Take 3 mLs (2.5 mg total) by nebulization every 6 (six) hours as needed for wheezing.  75 mL  0  . aspirin EC 81 MG EC tablet Take 1 tablet (81 mg total) by mouth daily.      Marland Kitchen. atenolol (TENORMIN) 25 MG tablet Take 25 mg by mouth every morning.       . clopidogrel (PLAVIX) 75 MG tablet Take 1 tablet (75 mg total) by mouth daily with breakfast.  30 tablet  11  . doxycycline (VIBRA-TABS) 100 MG tablet       . finasteride (PROSCAR) 5 MG tablet Take 5 mg by mouth daily.      . furosemide (LASIX) 20 MG tablet       . nitroGLYCERIN (NITROSTAT) 0.4 MG SL tablet Place 0.4 mg under the tongue every 5 (five) minutes as needed. FOR CHEST PAIN      . predniSONE (DELTASONE) 10 MG tablet Take 6 tablets (60 mg total) by mouth daily.  30 tablet  0  . tamsulosin (FLOMAX) 0.4 MG CAPS capsule       . budesonide-formoterol (SYMBICORT) 160-4.5 MCG/ACT inhaler Inhale 2 puffs into the lungs 2 (two) times daily.      . [DISCONTINUED] atorvastatin (  LIPITOR) 80 MG tablet Take 1 tablet (80 mg total) by mouth daily at 6 PM.  30 tablet  11   No current facility-administered medications for this visit.    Allergies:    Allergies  Allergen Reactions  . Lipitor [Atorvastatin] Other (See Comments)    "just paralyzed me; I can't take it"  . Statins Other (See Comments)    "paralyzed me; I can't take it"    Social History:  The patient  reports that he quit smoking about 2 years ago. His smoking use included Pipe. He has never used smokeless tobacco. He reports that he drinks about 3.5 ounces of alcohol per week. He reports that he does not use illicit drugs.   ROS:  Please see the history of present illness.   No syncope, palpitations, transient neurological complaints, or claudication. Has been having swelling  of the right knee.   All other systems reviewed and negative.   OBJECTIVE: VS:  BP 120/80  Pulse 54  Ht 6' (1.829 m)  Wt 226 lb (102.513 kg)  BMI 30.64 kg/m2 Well nourished, well developed, in no acute distress, elderly HEENT: normal Neck: JVD flat. Carotid bruit absent  Cardiac:  normal S1, S2; RRR; no murmur Lungs:  clear to auscultation bilaterally, no wheezing, rhonchi or rales Abd: soft, nontender, no hepatomegaly Ext: No edema. Pulses 2+ Skin: warm and dry Neuro:  CNs 2-12 intact, no focal abnormalities noted  EKG:  Normal with the exception of sinus bradycardia       Signed, Darci Needle III, MD 04/30/2013 10:27 AM  Past Medical History  CAD with LAD/Diag CBPTCA and Kissing 2007, 10/13--+ NSTEMI with DES to RCA (PDA)   Hypertension   Hyperlipidemia   History of depression/anxiety   BPH   GERD

## 2013-04-30 NOTE — Patient Instructions (Signed)
Your physician recommends that you continue on your current medications as directed. Please refer to the Current Medication list given to you today.  Your physician wants you to follow-up in: 1 year You will receive a reminder letter in the mail two months in advance. If you don't receive a letter, please call our office to schedule the follow-up appointment.  Please call the office if you are experiencing chest pain, or swelling

## 2013-05-01 ENCOUNTER — Telehealth: Payer: Self-pay | Admitting: *Deleted

## 2013-05-01 ENCOUNTER — Encounter: Payer: Self-pay | Admitting: Interventional Cardiology

## 2013-05-01 NOTE — Telephone Encounter (Signed)
Mitchell RhodesBetty called to let Mitchell [name] know that after his office visit they reviewed his medication list at home and wanted Mitchell [name] to know that he is not taking proscar, doxycycline or prednisone. I will remove these from his med list.

## 2013-05-04 ENCOUNTER — Telehealth: Payer: Self-pay

## 2013-05-04 NOTE — Telephone Encounter (Signed)
pt given lab results.At target and no evidence liver injury.pt verbalized understanding, and rqst copy of labs be mailed.done.

## 2013-05-04 NOTE — Telephone Encounter (Signed)
Message copied by Jarvis NewcomerPARRIS-GODLEY, LISA S on Mon May 04, 2013  9:00 AM ------      Message from: Verdis PrimeSMITH, HENRY      Created: Thu Apr 30, 2013  8:03 PM       At target and no evidence liver injury ------

## 2013-12-08 ENCOUNTER — Encounter (HOSPITAL_BASED_OUTPATIENT_CLINIC_OR_DEPARTMENT_OTHER): Payer: Medicare HMO | Attending: General Surgery

## 2013-12-08 DIAGNOSIS — Z7902 Long term (current) use of antithrombotics/antiplatelets: Secondary | ICD-10-CM | POA: Diagnosis not present

## 2013-12-08 DIAGNOSIS — I2510 Atherosclerotic heart disease of native coronary artery without angina pectoris: Secondary | ICD-10-CM | POA: Diagnosis not present

## 2013-12-08 DIAGNOSIS — X58XXXA Exposure to other specified factors, initial encounter: Secondary | ICD-10-CM | POA: Insufficient documentation

## 2013-12-08 DIAGNOSIS — S91009A Unspecified open wound, unspecified ankle, initial encounter: Principal | ICD-10-CM

## 2013-12-08 DIAGNOSIS — I10 Essential (primary) hypertension: Secondary | ICD-10-CM | POA: Diagnosis not present

## 2013-12-08 DIAGNOSIS — Z951 Presence of aortocoronary bypass graft: Secondary | ICD-10-CM | POA: Diagnosis not present

## 2013-12-08 DIAGNOSIS — S81809A Unspecified open wound, unspecified lower leg, initial encounter: Principal | ICD-10-CM

## 2013-12-08 DIAGNOSIS — S81009A Unspecified open wound, unspecified knee, initial encounter: Secondary | ICD-10-CM | POA: Insufficient documentation

## 2013-12-08 NOTE — Progress Notes (Signed)
[facility]  NAME:  [name], [name] NO.:  [id number]  MEDICAL RECORD NO.:  [id number]      DATE OF BIRTH:  13-Oct-1940  PHYSICIAN:  Ardath Sax, M.D.           VISIT DATE:                                  OFFICE VISIT   This is a 73 year old male who comes to [place] because he had a traumatic wound to the anterior aspect of his left leg which developed into a large hematoma which later drained, and he now has a wound about 2 cm in diameter.  This man has been on Plavix since open heart surgery several years ago.  He also is on Lasix, nitroglycerin, albuterol, and metoprolol or hypertension.  He had the open heart surgery in 2013.  The wound on examination goes down fairly deep, but does not appear to go through the subcutaneous tissue.  I debrided it and elected to treat it with silver collagen and he will use this every day.  I also felt it would be good to wrap him in an Ace bandage as he has some edema of this lower extremity.  He is also on Bactrim DS and I wrote him another prescription to keep him on it.  His blood pressure was 128/88, pulse 49, temperature 98.  He weighs 224 pounds.  DIAGNOSES: 1. Traumatic wound to anterior aspect of the left leg. 2. Hypertension. 3. Coronary artery disease.     Ardath Sax, M.D.     PP/MEDQ  D:  12/08/2013  T:  12/08/2013  Job:  [id number]

## 2013-12-15 ENCOUNTER — Encounter (HOSPITAL_BASED_OUTPATIENT_CLINIC_OR_DEPARTMENT_OTHER): Payer: Medicare HMO | Attending: General Surgery

## 2013-12-15 DIAGNOSIS — S99929A Unspecified injury of unspecified foot, initial encounter: Principal | ICD-10-CM

## 2013-12-15 DIAGNOSIS — X58XXXA Exposure to other specified factors, initial encounter: Secondary | ICD-10-CM | POA: Insufficient documentation

## 2013-12-15 DIAGNOSIS — S99919A Unspecified injury of unspecified ankle, initial encounter: Principal | ICD-10-CM

## 2013-12-15 DIAGNOSIS — S8990XA Unspecified injury of unspecified lower leg, initial encounter: Secondary | ICD-10-CM | POA: Insufficient documentation

## 2013-12-22 DIAGNOSIS — S8990XA Unspecified injury of unspecified lower leg, initial encounter: Secondary | ICD-10-CM | POA: Diagnosis not present

## 2013-12-29 DIAGNOSIS — S99919A Unspecified injury of unspecified ankle, initial encounter: Secondary | ICD-10-CM | POA: Diagnosis not present

## 2013-12-29 DIAGNOSIS — S8990XA Unspecified injury of unspecified lower leg, initial encounter: Secondary | ICD-10-CM | POA: Diagnosis not present

## 2014-01-05 DIAGNOSIS — S8990XA Unspecified injury of unspecified lower leg, initial encounter: Secondary | ICD-10-CM | POA: Diagnosis not present

## 2014-01-05 DIAGNOSIS — S99929A Unspecified injury of unspecified foot, initial encounter: Secondary | ICD-10-CM | POA: Diagnosis not present

## 2014-01-12 DIAGNOSIS — S99919A Unspecified injury of unspecified ankle, initial encounter: Secondary | ICD-10-CM | POA: Diagnosis not present

## 2014-01-12 DIAGNOSIS — S8990XA Unspecified injury of unspecified lower leg, initial encounter: Secondary | ICD-10-CM | POA: Diagnosis not present

## 2014-01-19 ENCOUNTER — Encounter (HOSPITAL_BASED_OUTPATIENT_CLINIC_OR_DEPARTMENT_OTHER): Payer: Medicare HMO | Attending: General Surgery

## 2014-01-19 DIAGNOSIS — L97929 Non-pressure chronic ulcer of unspecified part of left lower leg with unspecified severity: Secondary | ICD-10-CM | POA: Insufficient documentation

## 2014-01-19 DIAGNOSIS — I87332 Chronic venous hypertension (idiopathic) with ulcer and inflammation of left lower extremity: Secondary | ICD-10-CM | POA: Diagnosis not present

## 2014-01-26 DIAGNOSIS — I87332 Chronic venous hypertension (idiopathic) with ulcer and inflammation of left lower extremity: Secondary | ICD-10-CM | POA: Diagnosis not present

## 2014-01-26 DIAGNOSIS — L97929 Non-pressure chronic ulcer of unspecified part of left lower leg with unspecified severity: Secondary | ICD-10-CM | POA: Diagnosis not present

## 2014-02-02 DIAGNOSIS — L97929 Non-pressure chronic ulcer of unspecified part of left lower leg with unspecified severity: Secondary | ICD-10-CM | POA: Diagnosis not present

## 2014-02-02 DIAGNOSIS — I87332 Chronic venous hypertension (idiopathic) with ulcer and inflammation of left lower extremity: Secondary | ICD-10-CM | POA: Diagnosis not present

## 2014-02-09 DIAGNOSIS — L97929 Non-pressure chronic ulcer of unspecified part of left lower leg with unspecified severity: Secondary | ICD-10-CM | POA: Diagnosis not present

## 2014-02-09 DIAGNOSIS — I87332 Chronic venous hypertension (idiopathic) with ulcer and inflammation of left lower extremity: Secondary | ICD-10-CM | POA: Diagnosis not present

## 2014-02-16 ENCOUNTER — Encounter (HOSPITAL_BASED_OUTPATIENT_CLINIC_OR_DEPARTMENT_OTHER): Payer: Commercial Managed Care - HMO | Attending: General Surgery

## 2014-02-16 DIAGNOSIS — S81802D Unspecified open wound, left lower leg, subsequent encounter: Secondary | ICD-10-CM | POA: Insufficient documentation

## 2014-03-25 ENCOUNTER — Encounter (HOSPITAL_COMMUNITY): Payer: Self-pay | Admitting: Interventional Cardiology

## 2014-05-25 ENCOUNTER — Ambulatory Visit: Payer: Medicare HMO | Admitting: Interventional Cardiology

## 2014-07-26 ENCOUNTER — Ambulatory Visit: Payer: Medicare HMO | Admitting: Interventional Cardiology

## 2014-10-04 ENCOUNTER — Ambulatory Visit (INDEPENDENT_AMBULATORY_CARE_PROVIDER_SITE_OTHER): Payer: Commercial Managed Care - HMO | Admitting: Interventional Cardiology

## 2014-10-04 ENCOUNTER — Encounter: Payer: Self-pay | Admitting: Interventional Cardiology

## 2014-10-04 VITALS — BP 130/70 | HR 67 | Ht 72.0 in | Wt 234.4 lb

## 2014-10-04 DIAGNOSIS — I2510 Atherosclerotic heart disease of native coronary artery without angina pectoris: Secondary | ICD-10-CM

## 2014-10-04 DIAGNOSIS — I10 Essential (primary) hypertension: Secondary | ICD-10-CM | POA: Diagnosis not present

## 2014-10-04 DIAGNOSIS — J449 Chronic obstructive pulmonary disease, unspecified: Secondary | ICD-10-CM

## 2014-10-04 DIAGNOSIS — E785 Hyperlipidemia, unspecified: Secondary | ICD-10-CM

## 2014-10-04 NOTE — Patient Instructions (Signed)
Medication Instructions:  Your physician recommends that you continue on your current medications as directed. Please refer to the Current Medication list given to you today.   Labwork: None   Testing/Procedures: None   Follow-Up: Your physician wants you to follow-up in: 1 year with Dr.Smith You will receive a reminder letter in the mail two months in advance. If you don't receive a letter, please call our office to schedule the follow-up appointment.  Any Other Special Instructions Will Be Listed Below (If Applicable).   

## 2014-10-04 NOTE — Progress Notes (Signed)
Cardiology Office Note   Date:  10/04/2014   ID:  Mitchell [name], DOB 20-Sep-1940, MRN [id number]  PCP:  Default, Provider, MD  Cardiologist:  Lesleigh Noe, MD   Chief Complaint  Patient presents with  . Coronary Artery Disease      History of Present Illness: Mitchell [name] is a 74 y.o. male who presents for COPD, coronary artery disease with LAD cutting balloon angioplasty 2007 and DES PDA 2013, hypertension, and hyperlipidemia.  He has no complaints. He specifically denies angina. There is no orthopnea PND or lower extremity swelling. He has not had palpitations or syncope.  Past Medical History  Diagnosis Date  . Hypertension   . Hyperlipidemia   . Depression   . BPH (benign prostatic hypertrophy)   . Anxiety   . GERD (gastroesophageal reflux disease)   . Coronary artery disease   . Myocardial infarction 01/22/2012  . Anginal pain 01/2012  . COPD (chronic obstructive pulmonary disease)     "maybe" (02/14/2012)  . Shortness of breath     "walking and laying down sometimes" (02/14/2012)    Past Surgical History  Procedure Laterality Date  . Ganglion cyst removed  1956?    right  . Coronary angioplasty with stent placement  05/11/2005    PCI OF LAD  . Cataract extraction w/ intraocular lens  implant, bilateral  ~ 2010  . Refractive surgery  2010    left  . Left heart catheterization with coronary angiogram Bilateral 01/24/2012    Procedure: LEFT HEART CATHETERIZATION WITH CORONARY ANGIOGRAM;  Surgeon: Lesleigh Noe, MD;  Location: [facility] CATH LAB;  Service: Cardiovascular;  Laterality: Bilateral;     Current Outpatient Prescriptions  Medication Sig Dispense Refill  . albuterol (PROVENTIL HFA;VENTOLIN HFA) 108 (90 BASE) MCG/ACT inhaler Inhale 2 puffs into the lungs every 6 (six) hours as needed for wheezing. For shortness of breath    . albuterol (PROVENTIL) (2.5 MG/3ML) 0.083% nebulizer solution Take 3 mLs (2.5 mg total) by nebulization every 6  (six) hours as needed for wheezing. 75 mL 0  . aspirin EC 81 MG EC tablet Take 1 tablet (81 mg total) by mouth daily.    Marland Kitchen atenolol (TENORMIN) 25 MG tablet Take 25 mg by mouth every morning.     Marland Kitchen atorvastatin (LIPITOR) 10 MG tablet Take 10 mg by mouth daily.    . budesonide-formoterol (SYMBICORT) 160-4.5 MCG/ACT inhaler Inhale 2 puffs into the lungs 2 (two) times daily.    . clopidogrel (PLAVIX) 75 MG tablet Take 1 tablet (75 mg total) by mouth daily with breakfast. 30 tablet 11  . diclofenac sodium (VOLTAREN) 1 % GEL Apply topically 4 (four) times daily.    Marland Kitchen doxycycline (VIBRAMYCIN) 100 MG capsule Take 100 mg by mouth 2 (two) times daily.    . finasteride (PROSCAR) 5 MG tablet Take 5 mg by mouth daily.    . Fluticasone-Salmeterol (ADVAIR) 250-50 MCG/DOSE AEPB Inhale 1 puff into the lungs 2 (two) times daily.    . furosemide (LASIX) 20 MG tablet Take 20 mg by mouth daily. Fluid pill    . mometasone-formoterol (DULERA) 100-5 MCG/ACT AERO Inhale 2 puffs into the lungs 2 (two) times daily.    . nitroGLYCERIN (NITROSTAT) 0.4 MG SL tablet Place 0.4 mg under the tongue every 5 (five) minutes as needed. FOR CHEST PAIN    . tamsulosin (FLOMAX) 0.4 MG CAPS capsule      No current facility-administered medications for this visit.  Allergies:   Lipitor and Statins    Social History:  The patient  reports that he quit smoking about 3 years ago. His smoking use included Pipe. He has never used smokeless tobacco. He reports that he drinks about 3.5 oz of alcohol per week. He reports that he does not use illicit drugs.   Family History:  The patient's family history includes Heart failure in his brother and mother.    ROS:  Please see the history of present illness.   Otherwise, review of systems are positive for under financial stress but otherwise no complaints.   All other systems are reviewed and negative.    PHYSICAL EXAM: VS:  There were no vitals taken for this visit. , BMI There is no  weight on file to calculate BMI. GEN: Well nourished, well developed, in no acute distress HEENT: normal Neck: no JVD, carotid bruits, or masses Cardiac: RRR; no murmurs, rubs, or gallops,no edema  Respiratory:  clear to auscultation bilaterally, normal work of breathing GI: soft, nontender, nondistended, + BS MS: no deformity or atrophy Skin: warm and dry, no rash Neuro:  Strength and sensation are intact Psych: euthymic mood, full affect   EKG:  EKG is ordered today. The ekg ordered today demonstrates normal sinus rhythm with normal appearance   Recent Labs: No results found for requested labs within last 365 days.    Lipid Panel    Component Value Date/Time   CHOL 167 04/30/2013 1043   TRIG 75.0 04/30/2013 1043   HDL 64.90 04/30/2013 1043   CHOLHDL 3 04/30/2013 1043   VLDL 15.0 04/30/2013 1043   LDLCALC 87 04/30/2013 1043      Wt Readings from Last 3 Encounters:  04/30/13 102.513 kg (226 lb)  02/14/12 101.691 kg (224 lb 3 oz)  01/25/12 101.7 kg (224 lb 3.3 oz)      Other studies Reviewed: Additional studies/ records that were reviewed today include:    ASSESSMENT AND PLAN:  Essential hypertension -stable  Hyperlipemia - no recent laboratory data  Chronic obstructive pulmonary disease, unspecified COPD, unspecified chronic bronchitis type - limited teeing dyspnea  Coronary artery disease involving native coronary artery of native heart without angina pectoris - no nitroglycerin use     Current medicines are reviewed at length with the patient today.  The patient does not have concerns regarding medicines.  The following changes have been made:  no change  Labs/ tests ordered today include:  No orders of the defined types were placed in this encounter.     Disposition:   FU with HS in 1 year  Signed, Lesleigh Noe, MD  10/04/2014 2:23 PM    [facility] [address] [place], [place], [place]  [address] Phone: [phone];  Fax: [phone]

## 2015-01-31 ENCOUNTER — Inpatient Hospital Stay (HOSPITAL_COMMUNITY)
Admission: EM | Admit: 2015-01-31 | Discharge: 2015-02-03 | DRG: 872 | Disposition: A | Discharge status: Home Health | Payer: Commercial Managed Care - HMO | Attending: Internal Medicine | Admitting: Internal Medicine

## 2015-01-31 ENCOUNTER — Emergency Department (HOSPITAL_COMMUNITY): Payer: Commercial Managed Care - HMO

## 2015-01-31 ENCOUNTER — Encounter (HOSPITAL_COMMUNITY): Payer: Self-pay | Admitting: *Deleted

## 2015-01-31 DIAGNOSIS — J441 Chronic obstructive pulmonary disease with (acute) exacerbation: Secondary | ICD-10-CM | POA: Diagnosis present

## 2015-01-31 DIAGNOSIS — I252 Old myocardial infarction: Secondary | ICD-10-CM | POA: Diagnosis not present

## 2015-01-31 DIAGNOSIS — Z9861 Coronary angioplasty status: Secondary | ICD-10-CM

## 2015-01-31 DIAGNOSIS — E785 Hyperlipidemia, unspecified: Secondary | ICD-10-CM | POA: Diagnosis present

## 2015-01-31 DIAGNOSIS — I2510 Atherosclerotic heart disease of native coronary artery without angina pectoris: Secondary | ICD-10-CM | POA: Diagnosis present

## 2015-01-31 DIAGNOSIS — F419 Anxiety disorder, unspecified: Secondary | ICD-10-CM | POA: Diagnosis present

## 2015-01-31 DIAGNOSIS — Z8659 Personal history of other mental and behavioral disorders: Secondary | ICD-10-CM

## 2015-01-31 DIAGNOSIS — H547 Unspecified visual loss: Secondary | ICD-10-CM

## 2015-01-31 DIAGNOSIS — H548 Legal blindness, as defined in USA: Secondary | ICD-10-CM | POA: Diagnosis present

## 2015-01-31 DIAGNOSIS — L299 Pruritus, unspecified: Secondary | ICD-10-CM | POA: Diagnosis present

## 2015-01-31 DIAGNOSIS — Z7902 Long term (current) use of antithrombotics/antiplatelets: Secondary | ICD-10-CM | POA: Diagnosis not present

## 2015-01-31 DIAGNOSIS — N400 Enlarged prostate without lower urinary tract symptoms: Secondary | ICD-10-CM | POA: Diagnosis present

## 2015-01-31 DIAGNOSIS — R739 Hyperglycemia, unspecified: Secondary | ICD-10-CM | POA: Diagnosis present

## 2015-01-31 DIAGNOSIS — Z6831 Body mass index (BMI) 31.0-31.9, adult: Secondary | ICD-10-CM | POA: Diagnosis not present

## 2015-01-31 DIAGNOSIS — I10 Essential (primary) hypertension: Secondary | ICD-10-CM | POA: Diagnosis present

## 2015-01-31 DIAGNOSIS — Z888 Allergy status to other drugs, medicaments and biological substances status: Secondary | ICD-10-CM

## 2015-01-31 DIAGNOSIS — F329 Major depressive disorder, single episode, unspecified: Secondary | ICD-10-CM | POA: Diagnosis present

## 2015-01-31 DIAGNOSIS — Z7982 Long term (current) use of aspirin: Secondary | ICD-10-CM | POA: Diagnosis not present

## 2015-01-31 DIAGNOSIS — M79671 Pain in right foot: Secondary | ICD-10-CM

## 2015-01-31 DIAGNOSIS — E669 Obesity, unspecified: Secondary | ICD-10-CM | POA: Diagnosis present

## 2015-01-31 DIAGNOSIS — Z87891 Personal history of nicotine dependence: Secondary | ICD-10-CM

## 2015-01-31 DIAGNOSIS — K219 Gastro-esophageal reflux disease without esophagitis: Secondary | ICD-10-CM | POA: Diagnosis present

## 2015-01-31 DIAGNOSIS — Z8249 Family history of ischemic heart disease and other diseases of the circulatory system: Secondary | ICD-10-CM

## 2015-01-31 DIAGNOSIS — R0600 Dyspnea, unspecified: Secondary | ICD-10-CM | POA: Diagnosis present

## 2015-01-31 DIAGNOSIS — E6609 Other obesity due to excess calories: Secondary | ICD-10-CM

## 2015-01-31 DIAGNOSIS — I2583 Coronary atherosclerosis due to lipid rich plaque: Secondary | ICD-10-CM

## 2015-01-31 DIAGNOSIS — R079 Chest pain, unspecified: Secondary | ICD-10-CM | POA: Diagnosis not present

## 2015-01-31 DIAGNOSIS — Z7289 Other problems related to lifestyle: Secondary | ICD-10-CM

## 2015-01-31 DIAGNOSIS — Z79899 Other long term (current) drug therapy: Secondary | ICD-10-CM | POA: Diagnosis not present

## 2015-01-31 DIAGNOSIS — I25119 Atherosclerotic heart disease of native coronary artery with unspecified angina pectoris: Secondary | ICD-10-CM | POA: Diagnosis present

## 2015-01-31 DIAGNOSIS — R000 Tachycardia, unspecified: Secondary | ICD-10-CM

## 2015-01-31 DIAGNOSIS — A419 Sepsis, unspecified organism: Principal | ICD-10-CM | POA: Diagnosis present

## 2015-01-31 DIAGNOSIS — F1090 Alcohol use, unspecified, uncomplicated: Secondary | ICD-10-CM

## 2015-01-31 DIAGNOSIS — Z789 Other specified health status: Secondary | ICD-10-CM

## 2015-01-31 DIAGNOSIS — E782 Mixed hyperlipidemia: Secondary | ICD-10-CM | POA: Diagnosis present

## 2015-01-31 LAB — COMPREHENSIVE METABOLIC PANEL
ALBUMIN: 3.6 g/dL (ref 3.5–5.0)
ALBUMIN: 3.2 g/dL — AB (ref 3.5–5.0)
ALK PHOS: 62 U/L (ref 38–126)
ALT: 20 U/L (ref 17–63)
ALT: 19 U/L (ref 17–63)
ANION GAP: 10 (ref 5–15)
AST: 22 U/L (ref 15–41)
AST: 25 U/L (ref 15–41)
Alkaline Phosphatase: 53 U/L (ref 38–126)
Anion gap: 14 (ref 5–15)
BILIRUBIN TOTAL: 0.9 mg/dL (ref 0.3–1.2)
BUN: 25 mg/dL — AB (ref 6–20)
BUN: 23 mg/dL — ABNORMAL HIGH (ref 6–20)
CHLORIDE: 102 mmol/L (ref 101–111)
CO2: 25 mmol/L (ref 22–32)
CO2: 25 mmol/L (ref 22–32)
CREATININE: 1.36 mg/dL — AB (ref 0.61–1.24)
CREATININE: 1.3 mg/dL — AB (ref 0.61–1.24)
Calcium: 9.3 mg/dL (ref 8.9–10.3)
Calcium: 9.4 mg/dL (ref 8.9–10.3)
Chloride: 101 mmol/L (ref 101–111)
GFR calc Af Amer: 57 mL/min — ABNORMAL LOW (ref >60)
GFR calc non Af Amer: 52 mL/min — ABNORMAL LOW (ref >60)
GFR, EST NON AFRICAN AMERICAN: 49 mL/min — AB (ref >60)
GLUCOSE: 72 mg/dL (ref 65–99)
GLUCOSE: 281 mg/dL — AB (ref 65–99)
POTASSIUM: 4.2 mmol/L (ref 3.5–5.1)
Potassium: 4 mmol/L (ref 3.5–5.1)
SODIUM: 137 mmol/L (ref 135–145)
Sodium: 140 mmol/L (ref 135–145)
TOTAL PROTEIN: 5.9 g/dL — AB (ref 6.5–8.1)
Total Bilirubin: 0.9 mg/dL (ref 0.3–1.2)
Total Protein: 5.7 g/dL — ABNORMAL LOW (ref 6.5–8.1)

## 2015-01-31 LAB — CBC WITH DIFFERENTIAL/PLATELET
BASOS ABS: 0 10*3/uL (ref 0.0–0.1)
BASOS PCT: 0 %
Eosinophils Absolute: 0 10*3/uL (ref 0.0–0.7)
Eosinophils Relative: 0 %
HEMATOCRIT: 46 % (ref 39.0–52.0)
Hemoglobin: 15.1 g/dL (ref 13.0–17.0)
LYMPHS PCT: 8 %
Lymphs Abs: 1.2 10*3/uL (ref 0.7–4.0)
MCH: 29.5 pg (ref 26.0–34.0)
MCHC: 32.8 g/dL (ref 30.0–36.0)
MCV: 89.8 fL (ref 78.0–100.0)
MONO ABS: 1.7 10*3/uL — AB (ref 0.1–1.0)
Monocytes Relative: 10 %
NEUTROS ABS: 13.4 10*3/uL — AB (ref 1.7–7.7)
NEUTROS PCT: 82 %
Platelets: 146 10*3/uL — ABNORMAL LOW (ref 150–400)
RBC: 5.12 MIL/uL (ref 4.22–5.81)
RDW: 15.1 % (ref 11.5–15.5)
WBC: 16.3 10*3/uL — AB (ref 4.0–10.5)

## 2015-01-31 LAB — INFLUENZA PANEL BY PCR (TYPE A & B)
H1N1FLUPCR: NOT DETECTED
Influenza A By PCR: NEGATIVE
Influenza B By PCR: NEGATIVE

## 2015-01-31 LAB — BRAIN NATRIURETIC PEPTIDE: B Natriuretic Peptide: 40.9 pg/mL (ref 0.0–100.0)

## 2015-01-31 LAB — TSH: TSH: 0.608 u[IU]/mL (ref 0.350–4.500)

## 2015-01-31 LAB — TROPONIN I

## 2015-01-31 MED ORDER — ALBUTEROL SULFATE (2.5 MG/3ML) 0.083% IN NEBU
5.0000 mg | INHALATION_SOLUTION | Freq: Once | RESPIRATORY_TRACT | Status: AC
  Administered 2015-01-31: 5 mg via RESPIRATORY_TRACT
  Filled 2015-01-31: qty 6

## 2015-01-31 MED ORDER — IPRATROPIUM BROMIDE 0.02 % IN SOLN
0.5000 mg | Freq: Once | RESPIRATORY_TRACT | Status: AC
  Administered 2015-01-31: 0.5 mg via RESPIRATORY_TRACT
  Filled 2015-01-31: qty 2.5

## 2015-01-31 MED ORDER — LEVOFLOXACIN IN D5W 750 MG/150ML IV SOLN
750.0000 mg | INTRAVENOUS | Status: DC
  Administered 2015-01-31 – 2015-02-02 (×3): 750 mg via INTRAVENOUS
  Filled 2015-01-31 (×4): qty 150

## 2015-01-31 MED ORDER — ONDANSETRON HCL 4 MG/2ML IJ SOLN
4.0000 mg | Freq: Four times a day (QID) | INTRAMUSCULAR | Status: DC | PRN
  Administered 2015-02-03: 4 mg via INTRAVENOUS
  Filled 2015-01-31: qty 2

## 2015-01-31 MED ORDER — DICLOFENAC SODIUM 1 % TD GEL
2.0000 g | Freq: Four times a day (QID) | TRANSDERMAL | Status: DC
  Administered 2015-01-31 – 2015-02-03 (×12): 2 g via TOPICAL
  Filled 2015-01-31: qty 100

## 2015-01-31 MED ORDER — ACETAMINOPHEN 325 MG PO TABS
650.0000 mg | ORAL_TABLET | Freq: Four times a day (QID) | ORAL | Status: DC | PRN
  Administered 2015-02-01 – 2015-02-03 (×3): 650 mg via ORAL
  Filled 2015-01-31 (×3): qty 2

## 2015-01-31 MED ORDER — ATENOLOL 50 MG PO TABS
25.0000 mg | ORAL_TABLET | Freq: Every morning | ORAL | Status: DC
  Administered 2015-01-31 – 2015-02-03 (×4): 25 mg via ORAL
  Filled 2015-01-31 (×4): qty 1

## 2015-01-31 MED ORDER — ONDANSETRON HCL 4 MG PO TABS: 4.0000 mg | ORAL_TABLET | Freq: Four times a day (QID) | ORAL | Status: DC | PRN

## 2015-01-31 MED ORDER — DOCUSATE SODIUM 100 MG PO CAPS
100.0000 mg | ORAL_CAPSULE | Freq: Every day | ORAL | Status: DC | PRN
  Administered 2015-02-01: 100 mg via ORAL
  Filled 2015-01-31: qty 1

## 2015-01-31 MED ORDER — FUROSEMIDE 20 MG PO TABS
20.0000 mg | ORAL_TABLET | Freq: Every day | ORAL | Status: DC
  Administered 2015-01-31 – 2015-02-01 (×2): 20 mg via ORAL
  Filled 2015-01-31 (×2): qty 1

## 2015-01-31 MED ORDER — POLYETHYLENE GLYCOL 3350 17 G PO PACK
17.0000 g | PACK | Freq: Every day | ORAL | Status: DC | PRN
  Administered 2015-02-01: 17 g via ORAL
  Filled 2015-01-31: qty 1

## 2015-01-31 MED ORDER — METHYLPREDNISOLONE SODIUM SUCC 125 MG IJ SOLR
125.0000 mg | Freq: Once | INTRAMUSCULAR | Status: AC
  Administered 2015-01-31: 125 mg via INTRAVENOUS
  Filled 2015-01-31: qty 2

## 2015-01-31 MED ORDER — BUDESONIDE 0.25 MG/2ML IN SUSP
0.2500 mg | Freq: Two times a day (BID) | RESPIRATORY_TRACT | Status: DC
  Administered 2015-01-31 – 2015-02-03 (×6): 0.25 mg via RESPIRATORY_TRACT
  Filled 2015-01-31 (×8): qty 2

## 2015-01-31 MED ORDER — TAMSULOSIN HCL 0.4 MG PO CAPS
0.4000 mg | ORAL_CAPSULE | Freq: Every day | ORAL | Status: DC
  Administered 2015-02-01 – 2015-02-03 (×3): 0.4 mg via ORAL
  Filled 2015-01-31 (×3): qty 1

## 2015-01-31 MED ORDER — CETYLPYRIDINIUM CHLORIDE 0.05 % MT LIQD
7.0000 mL | Freq: Two times a day (BID) | OROMUCOSAL | Status: DC
  Administered 2015-01-31 – 2015-02-03 (×6): 7 mL via OROMUCOSAL

## 2015-01-31 MED ORDER — CLOPIDOGREL BISULFATE 75 MG PO TABS
75.0000 mg | ORAL_TABLET | Freq: Every day | ORAL | Status: DC
  Administered 2015-02-01 – 2015-02-03 (×3): 75 mg via ORAL
  Filled 2015-01-31 (×3): qty 1

## 2015-01-31 MED ORDER — NITROGLYCERIN 0.4 MG SL SUBL: 0.4000 mg | SUBLINGUAL_TABLET | SUBLINGUAL | Status: DC | PRN

## 2015-01-31 MED ORDER — IPRATROPIUM-ALBUTEROL 0.5-2.5 (3) MG/3ML IN SOLN
3.0000 mL | RESPIRATORY_TRACT | Status: DC
  Administered 2015-01-31 – 2015-02-03 (×17): 3 mL via RESPIRATORY_TRACT
  Filled 2015-01-31 (×15): qty 3

## 2015-01-31 MED ORDER — ASPIRIN EC 81 MG PO TBEC: 81.0000 mg | DELAYED_RELEASE_TABLET | Freq: Every day | ORAL | Status: DC

## 2015-01-31 MED ORDER — SODIUM CHLORIDE 0.9 % IJ SOLN
3.0000 mL | Freq: Two times a day (BID) | INTRAMUSCULAR | Status: DC
  Administered 2015-01-31: 3 mL via INTRAVENOUS
  Administered 2015-01-31: 19 mL via INTRAVENOUS
  Administered 2015-02-01 – 2015-02-03 (×5): 3 mL via INTRAVENOUS

## 2015-01-31 MED ORDER — ACETAMINOPHEN 650 MG RE SUPP: 650.0000 mg | Freq: Four times a day (QID) | RECTAL | Status: DC | PRN

## 2015-01-31 MED ORDER — ENOXAPARIN SODIUM 40 MG/0.4ML ~~LOC~~ SOLN
40.0000 mg | SUBCUTANEOUS | Status: DC
  Administered 2015-01-31 – 2015-02-02 (×3): 40 mg via SUBCUTANEOUS
  Filled 2015-01-31 (×4): qty 0.4

## 2015-01-31 MED ORDER — ZOLPIDEM TARTRATE 5 MG PO TABS
5.0000 mg | ORAL_TABLET | Freq: Once | ORAL | Status: AC
  Administered 2015-01-31: 5 mg via ORAL
  Filled 2015-01-31: qty 1

## 2015-01-31 MED ORDER — ASPIRIN EC 81 MG PO TBEC
81.0000 mg | DELAYED_RELEASE_TABLET | Freq: Every day | ORAL | Status: DC
  Administered 2015-01-31 – 2015-02-03 (×4): 81 mg via ORAL
  Filled 2015-01-31 (×4): qty 1

## 2015-01-31 MED ORDER — HYDROCODONE-ACETAMINOPHEN 5-325 MG PO TABS
1.0000 | ORAL_TABLET | ORAL | Status: DC | PRN
  Administered 2015-01-31 – 2015-02-03 (×4): 1 via ORAL
  Filled 2015-01-31 (×4): qty 1

## 2015-01-31 NOTE — H&P (Signed)
[facility] History and Physical  Ralphael Southgate AUQ:[id number] DOB: 02/07/1941 DOA: 01/31/2015  Referring physician: Jola Schmidt, MD PCP: Default, Provider, MD   Chief Complaint: "Couldn't breathe".  HPI:  Patient is a 74 year old Caucasian male with past medical history significant for COPD not requiring home oxygen, coronary artery disease, myocardial infarction, worst being legally blind, alcohol use, and obesity; who presents with a 2 day history of progressively worsening shortness of breath. Patient reported associated symptoms of a duct of cough with greenish sputum production, subjective fever, diaphoresis, and chest pain. Currently the patient feels as though he has a headache He reports that he tried taking at least 5 nebulized treatments at home without relief of symptoms. He notes no significant relief with the treatments. He reports living at home alone legally blind. Feels like he needs assistance at home and is not able to get it. Family which includes 3 sons live nearby.  His cardiologist Daneen Schick, MD reports last seeing him in April or May of this year. He reports that he took one full pill yesterday and that is instructed to take 1-2 depending on his weight. Fluid fills can make him lose up to 10 pounds as he states his dry weight is somewhere around 226. On presentation to the ED his weight is 234. BMPs were within normal limits. Lastly the patient complains of right foot pain and denies any recent trauma. Pain is experienced mostly with bearing weight.    Review of Systems:  Constitutional: Positive for fever, chills, diaphoresis, and fatigue.  HEENT: Positive for eye pain, headache, decreased vision. Denies photophobia,  redness, hearing loss, ear pain, congestion, sore throat, rhinorrhea, sneezing, mouth sores, trouble swallowing, neck pain, neck stiffness and tinnitus.  Respiratory: Positive for SOB, DOE, cough, chest tightness, and wheezing.  Cardiovascular:  Positive for chest pain. Denies palpitations and leg swelling.  Gastrointestinal: Positive for abdominal distention. Denies nausea, vomiting, abdominal pain, diarrhea, constipation, blood in stool.  Genitourinary: Denies dysuria, urgency, frequency, hematuria, flank pain and difficulty urinating.  Musculoskeletal: Denies myalgias, back pain, joint swelling, arthralgias and gait problem.  Skin: Denies pallor, rash and wound.  Neurological: Denies dizziness, seizures, syncope, weakness, light-headedness, and numbness. Positive for headaches.  Hematological: Denies adenopathy. Easy bruising, personal or family bleeding history  Psychiatric/Behavioral: Denies suicidal ideation, mood changes, confusion, nervousness, sleep disturbance and agitation       Past Medical History  Diagnosis Date  . Hypertension   . Hyperlipidemia   . Depression   . BPH (benign prostatic hypertrophy)   . Anxiety   . GERD (gastroesophageal reflux disease)   . Coronary artery disease   . Myocardial infarction ([place]) 01/22/2012  . Anginal pain ([place]) 01/2012  . COPD (chronic obstructive pulmonary disease) ([place])     "maybe" (02/14/2012)  . Shortness of breath     "walking and laying down sometimes" (02/14/2012)     Past Surgical History  Procedure Laterality Date  . Ganglion cyst removed  1956?    right  . Coronary angioplasty with stent placement  05/11/2005    PCI OF LAD  . Cataract extraction w/ intraocular lens  implant, bilateral  ~ 2010  . Refractive surgery  2010    left  . Left heart catheterization with coronary angiogram Bilateral 01/24/2012    Procedure: LEFT HEART CATHETERIZATION WITH CORONARY ANGIOGRAM;  Surgeon: Sinclair Grooms, MD;  Location[facility] C[facility] LAB;  Service: Cardiovascular;  Laterality: Bilateral;      Social History:  reports  that he quit smoking about 3 years ago. His smoking use included Pipe. He has never used smokeless tobacco. He reports that he drinks about 3.5 oz of alcohol  per week. He reports that he does not use illicit drugs.  where does patient live--home  and with whom if at home? alone Can patient participate in ADLs? Y  Allergies  Allergen Reactions  . Lipitor [Atorvastatin] Other (See Comments)    "just paralyzed me; I can't take it"  . Statins Other (See Comments)    "paralyzed me; I can't take it"    Family History  Problem Relation Age of Onset  . Heart failure Mother   . Heart failure Brother       FAMILY HISTORY    Prior to Admission medications   Medication Sig Start Date End Date Taking? Authorizing Provider  albuterol (PROVENTIL HFA;VENTOLIN HFA) 108 (90 BASE) MCG/ACT inhaler Inhale 2 puffs into the lungs every 6 (six) hours as needed for wheezing. For shortness of breath   Yes Historical Provider, MD  albuterol (PROVENTIL) (2.5 MG/3ML) 0.083% nebulizer solution Take 3 mLs (2.5 mg total) by nebulization every 6 (six) hours as needed for wheezing. 07/14/12  Yes Serita Grit, MD  aspirin EC 81 MG EC tablet Take 1 tablet (81 mg total) by mouth daily. 02/15/12  Yes Jettie Booze, MD  atenolol (TENORMIN) 25 MG tablet Take 25 mg by mouth every morning.    Yes Historical Provider, MD  budesonide-formoterol (SYMBICORT) 160-4.5 MCG/ACT inhaler Inhale 2 puffs into the lungs 2 (two) times daily.   Yes Historical Provider, MD  clopidogrel (PLAVIX) 75 MG tablet Take 1 tablet (75 mg total) by mouth daily with breakfast. 02/15/12  Yes Jettie Booze, MD  diclofenac sodium (VOLTAREN) 1 % GEL Apply topically 4 (four) times daily.   Yes Historical Provider, MD  Fluticasone-Salmeterol (ADVAIR) 250-50 MCG/DOSE AEPB Inhale 1 puff into the lungs 2 (two) times daily.   Yes Historical Provider, MD  furosemide (LASIX) 20 MG tablet Take 20 mg by mouth daily. Fluid pill 02/11/13  Yes Historical Provider, MD  mometasone-formoterol (DULERA) 100-5 MCG/ACT AERO Inhale 2 puffs into the lungs 2 (two) times daily.   Yes Historical Provider, MD  nitroGLYCERIN  (NITROSTAT) 0.4 MG SL tablet Place 0.4 mg under the tongue every 5 (five) minutes as needed. FOR CHEST PAIN   Yes Historical Provider, MD  tamsulosin (FLOMAX) 0.4 MG CAPS capsule Take 0.4 mg by mouth daily after breakfast.  03/19/13  Yes Historical Provider, MD     Physical Exam: Filed Vitals:   01/31/15 0714 01/31/15 0715 01/31/15 0730 01/31/15 0841  BP: 116/63 120/69 122/71 133/79  Pulse: 104 103 101 93  Temp:      Resp: 24 27 25 17   Height:      Weight:      SpO2: 94% 93% 90% 95%     Constitutional: Vital signs reviewed. Patient is a obese male who appears in mild respiratory distress but is cooperative with exam. Alert and oriented 3  Head: Normocephalic and atraumatic  Ear: TM normal bilaterally  Mouth: no erythema or exudates, MMM  Eyes: PERRL, EOMI, conjunctivae normal, No scleral icterus.  Neck: Supple, Trachea midline normal ROM, No JVD, mass, thyromegaly, or carotid bruit present.  Cardiovascular: Tachycardic, S1 normal, S2 normal, no MRG, pulses symmetric and intact bilaterally  Pulmonary/Chest: Patient is tachypnea on nasal cannula oxygen has decreased overall air movement. Prolonged expiratory wheezes are appreciated with visualization of greenish sputum production.  Able to speak in almost full sentences. Abdominal: Soft. Non-tender, non-distended, bowel sounds are normal, no masses, organomegaly, or guarding present.  GU: no CVA tenderness Musculoskeletal: No joint deformities, erythema, or stiffness, ROM full and no nontender Ext: +1 pitting edema noted to the knee bilaterally. Tenderness to palpation of the dorsum of the right forefoot. No cyanosis, pulses palpable bilaterally (DP and PT)  Hematology: no cervical, inginal, or axillary adenopathy.  Neurological: A&O x3, Strenght is normal and symmetric bilaterally, cranial nerve II-XII are grossly intact, no focal motor deficit, sensory intact to light touch bilaterally.  Skin: Warm, dry and intact. No rash, cyanosis, or  clubbing.  Psychiatric: Normal mood and affect. speech and behavior is normal. Judgment and thought content normal. Cognition and memory are normal.      Data Review   Micro Results No results found for this or any previous visit (from the past 240 hour(s)).  Radiology Reports Dg Chest Portable 1 View  01/31/2015  CLINICAL DATA:  Shortness of breath, history of COPD and CHF. EXAM: PORTABLE CHEST 1 VIEW COMPARISON:  Radiograph 09/16/2012.  CT 01/22/2012 FINDINGS: Hyperinflation is unchanged from prior exam. Both costophrenic angles are excluded from the field of view. Heart at the upper limits normal in size, mediastinal contours are normal. No airspace consolidation, pulmonary edema, or pneumothorax. No large pleural effusion. Sclerotic densities projecting over the left upper hemithorax correspond to bone islands in the left posterior rib on prior CT. No acute osseous abnormalities are seen. IMPRESSION: Hyperinflation, unchanged from prior exam. No acute pulmonary process, allowing for both costophrenic angles being excluded. Electronically Signed   By: Jeb Levering M.D.   On: 01/31/2015 03:44     CBC  Recent Labs Lab 01/31/15 0405  WBC 16.3*  HGB 15.1  HCT 46.0  PLT 146*  MCV 89.8  MCH 29.5  MCHC 32.8  RDW 15.1  LYMPHSABS 1.2  MONOABS 1.7*  EOSABS 0.0  BASOSABS 0.0    Chemistries  No results for input(s): NA, K, CL, CO2, GLUCOSE, BUN, CREATININE, CALCIUM, MG, AST, ALT, ALKPHOS, BILITOT in the last 168 hours.  Invalid input(s): GFRCGP ------------------------------------------------------------------------------------------------------------------ CrCl cannot be calculated (Patient has no serum creatinine result on file.). ------------------------------------------------------------------------------------------------------------------ No results for input(s): HGBA1C in the last 72  hours. ------------------------------------------------------------------------------------------------------------------ No results for input(s): CHOL, HDL, LDLCALC, TRIG, CHOLHDL, LDLDIRECT in the last 72 hours. ------------------------------------------------------------------------------------------------------------------ No results for input(s): TSH, T4TOTAL, T3FREE, THYROIDAB in the last 72 hours.  Invalid input(s): FREET3 ------------------------------------------------------------------------------------------------------------------ No results for input(s): VITAMINB12, FOLATE, FERRITIN, TIBC, IRON, RETICCTPCT in the last 72 hours.  Coagulation profile No results for input(s): INR, PROTIME in the last 168 hours.  No results for input(s): DDIMER in the last 72 hours.  Cardiac Enzymes  Recent Labs Lab 01/31/15 0405  TROPONINI <0.03   ------------------------------------------------------------------------------------------------------------------ Invalid input(s): POCBNP   CBG: No results for input(s): GLUCAP in the last 168 hours.     EKG: Independently reviewed. Shows sinus tachycardia   Assessment/Plan Principal Problem: SIRS/sepsis: SIRS criteria are met in regards to patient's tachycardia, tachypnea, and elevated what blood cell count. Suspect a respiratory source.  Acute COPD exacerbation ([place]): Patient found to have significant wheezing with greenish sputum production.  Hyperinflated lungs on chest x-ray no signs of a definitive infiltrate. Given 125 mg of Solu-Medrol in the emergency department along with DuoNeb's. Patient with moderate to severe exacerbation considering increased dyspnea with the patient whose age is greater than 85 and previous history of cardiac disease.  -Sputum culture -Started  patient on Levaquin -Repeat checks x-ray in the morning -Budesonide nebulized every 12 hours scheduled -continue DuoNeb's every 4 hours  -We'll need to be  placed back on home inhalers once breathing better controlled . Hypertension: Controlled at this time Continue home meds atenolol  CAD (coronary artery disease) with history of PCI and 2 previous MIs. -Continue Plavix and aspirin  Hyperlipemia: Chronic Patient with allergy to Lipitor and statins -Place patient on a heart healthy diet - lipid panel ordered - Further counseling needed on dietary modifications  History of depression: patient expresses feelings of being down secondary to decreased sight and ability to function around his home  Vision impairment:chronic issue -Will have social worker assess for needs   Right foot pain: -Check x-ray foot -Physical therapy to eval  Obesity  Alcohol use ([place])  Sinus tachycardia ([place]) Secondary to acute infection being signs of   Code Status:   full Family Communication: bedside Disposition Plan: admit   Total time spent 55 minutes.Greater than 50% of this time was spent in counseling, explanation of diagnosis, planning of further management, and coordination of care  [facility] Pager [phone]  If 7PM-7AM, please contact night-coverage [email or web address] Password [email or web address] 01/31/2015, 8:51 AM

## 2015-01-31 NOTE — ED Provider Notes (Signed)
CSN: [id number]     Arrival date & time 01/31/15  4098 History  By signing my name below, I, Doreatha Martin, attest that this documentation has been prepared under the direction and in the presence of Azalia Bilis, MD. Electronically Signed: Doreatha Martin, ED Scribe. 01/31/2015. 3:31 AM.    Chief Complaint  Patient presents with  . Shortness of Breath  . Congestive Heart Failure   The history is provided by the patient. No language interpreter was used.    HPI Comments: Mitchell [name] is a 74 y.o. male with h/o HTN, HLD, CAD, MI, COPD who presents to the Emergency Department complaining of moderate SOB onset 2 days ago with associated HA, cough onset 5 hours ago. Pt took 5 neb treatments prior to EMS with some relief of SOB but notes that he has had increased coughing since. He states that symptoms are worsened when lying flat. No recent abx. He is not O2 dependent at home. Pt is a non-smoker. He states he previously smoked a pipe. PCP is Dr. Tenny Craw with [facility] at [facility]. He states he lives alone. No increased leg swelling or abdominal distension from baseline.   Past Medical History  Diagnosis Date  . Hypertension   . Hyperlipidemia   . Depression   . BPH (benign prostatic hypertrophy)   . Anxiety   . GERD (gastroesophageal reflux disease)   . Coronary artery disease   . Myocardial infarction ([facility]) 01/22/2012  . Anginal pain ([facility]) 01/2012  . COPD (chronic obstructive pulmonary disease) ([facility])     "maybe" (02/14/2012)  . Shortness of breath     "walking and laying down sometimes" (02/14/2012)   Past Surgical History  Procedure Laterality Date  . Ganglion cyst removed  1956?    right  . Coronary angioplasty with stent placement  05/11/2005    PCI OF LAD  . Cataract extraction w/ intraocular lens  implant, bilateral  ~ 2010  . Refractive surgery  2010    left  . Left heart catheterization with coronary angiogram Bilateral 01/24/2012    Procedure: LEFT HEART CATHETERIZATION  WITH CORONARY ANGIOGRAM;  Surgeon: Lesleigh Noe, MD;  Location: [facility] CATH LAB;  Service: Cardiovascular;  Laterality: Bilateral;   Family History  Problem Relation Age of Onset  . Heart failure Mother   . Heart failure Brother    Social History  Substance Use Topics  . Smoking status: Former Smoker -- 42 years    Types: Pipe    Quit date: 04/28/2011  . Smokeless tobacco: Never Used  . Alcohol Use: 3.5 oz/week    7 drink(s) per week     Comment: 02/14/2012 "weekends I have 6-7 mixed drinks"    Review of Systems  A complete 10 system review of systems was obtained and all systems are negative except as noted in the HPI and PMH.    Allergies  Lipitor and Statins  Home Medications   Prior to Admission medications   Medication Sig Start Date End Date Taking? Authorizing Provider  albuterol (PROVENTIL HFA;VENTOLIN HFA) 108 (90 BASE) MCG/ACT inhaler Inhale 2 puffs into the lungs every 6 (six) hours as needed for wheezing. For shortness of breath    Historical Provider, MD  albuterol (PROVENTIL) (2.5 MG/3ML) 0.083% nebulizer solution Take 3 mLs (2.5 mg total) by nebulization every 6 (six) hours as needed for wheezing. 07/14/12   Blake Divine, MD  aspirin EC 81 MG EC tablet Take 1 tablet (81 mg total) by mouth daily.  02/15/12   Corky Crafts, MD  atenolol (TENORMIN) 25 MG tablet Take 25 mg by mouth every morning.     Historical Provider, MD  atorvastatin (LIPITOR) 10 MG tablet Take 10 mg by mouth daily.    Historical Provider, MD  budesonide-formoterol (SYMBICORT) 160-4.5 MCG/ACT inhaler Inhale 2 puffs into the lungs 2 (two) times daily.    Historical Provider, MD  clopidogrel (PLAVIX) 75 MG tablet Take 1 tablet (75 mg total) by mouth daily with breakfast. 02/15/12   Corky Crafts, MD  diclofenac sodium (VOLTAREN) 1 % GEL Apply topically 4 (four) times daily.    Historical Provider, MD  doxycycline (VIBRAMYCIN) 100 MG capsule Take 100 mg by mouth 2 (two) times daily.     Historical Provider, MD  finasteride (PROSCAR) 5 MG tablet Take 5 mg by mouth daily.    Historical Provider, MD  Fluticasone-Salmeterol (ADVAIR) 250-50 MCG/DOSE AEPB Inhale 1 puff into the lungs 2 (two) times daily.    Historical Provider, MD  furosemide (LASIX) 20 MG tablet Take 20 mg by mouth daily. Fluid pill 02/11/13   Historical Provider, MD  mometasone-formoterol (DULERA) 100-5 MCG/ACT AERO Inhale 2 puffs into the lungs 2 (two) times daily.    Historical Provider, MD  nitroGLYCERIN (NITROSTAT) 0.4 MG SL tablet Place 0.4 mg under the tongue every 5 (five) minutes as needed. FOR CHEST PAIN    Historical Provider, MD  tamsulosin (FLOMAX) 0.4 MG CAPS capsule Take 0.4 mg by mouth daily after breakfast.  03/19/13   Historical Provider, MD   BP 125/67 mmHg  Pulse 110  Temp(Src) 99.6 F (37.6 C)  Resp 28  Ht 6' (1.829 m)  Wt 234 lb 8 oz (106.369 kg)  BMI 31.80 kg/m2  SpO2 92% Physical Exam  Constitutional: He is oriented to person, place, and time. He appears well-developed and well-nourished.  HENT:  Head: Normocephalic and atraumatic.  Eyes: EOM are normal.  Neck: Normal range of motion.  Cardiovascular: Normal rate, regular rhythm, normal heart sounds and intact distal pulses.   Pulmonary/Chest: Effort normal. No respiratory distress. He has wheezes. He has rales.  Mild rales and wheezing bilaterally. Orthopnea noted.   Abdominal: Soft. He exhibits no distension. There is no tenderness.  Musculoskeletal: Normal range of motion. He exhibits no edema.  No edema in the lower extremities.   Neurological: He is alert and oriented to person, place, and time.  Skin: Skin is warm and dry.  Psychiatric: He has a normal mood and affect. Judgment normal.  Nursing note and vitals reviewed.  ED Course  Procedures (including critical care time) DIAGNOSTIC STUDIES: Oxygen Saturation is 93% on [place] 2.5 L/min, adequate by my interpretation.    COORDINATION OF CARE: 3:29 AM Discussed treatment  plan with pt at bedside and pt agreed to plan.   Labs Review Labs Reviewed  CBC WITH DIFFERENTIAL/PLATELET - Abnormal; Notable for the following:    WBC 16.3 (*)    Platelets 146 (*)    Neutro Abs 13.4 (*)    Monocytes Absolute 1.7 (*)    All other components within normal limits  BRAIN NATRIURETIC PEPTIDE  TROPONIN I  COMPREHENSIVE METABOLIC PANEL    Imaging Review Dg Chest Portable 1 View  01/31/2015  CLINICAL DATA:  Shortness of breath, history of COPD and CHF. EXAM: PORTABLE CHEST 1 VIEW COMPARISON:  Radiograph 09/16/2012.  CT 01/22/2012 FINDINGS: Hyperinflation is unchanged from prior exam. Both costophrenic angles are excluded from the field of view. Heart at the upper  limits normal in size, mediastinal contours are normal. No airspace consolidation, pulmonary edema, or pneumothorax. No large pleural effusion. Sclerotic densities projecting over the left upper hemithorax correspond to bone islands in the left posterior rib on prior CT. No acute osseous abnormalities are seen. IMPRESSION: Hyperinflation, unchanged from prior exam. No acute pulmonary process, allowing for both costophrenic angles being excluded. Electronically Signed   By: Rubye OaksMelanie  Ehinger M.D.   On: 01/31/2015 03:44   I have personally reviewed and evaluated these images and lab results as part of my medical decision-making.   EKG Interpretation   Date/Time:  Monday January 31 2015 03:27:11 EDT Ventricular Rate:  108 PR Interval:  163 QRS Duration: 87 QT Interval:  350 QTC Calculation: 469 R Axis:   6 Text Interpretation:  Sinus tachycardia No significant change was found  Confirmed by King Pinzon  MD, Dorene Bruni ([id number]) on 01/31/2015 4:36:31 AM      MDM   Final diagnoses:  None    7:28 AM Pt continues to feel SOB at this time. Will be admitted for COPD exacerbation. Still with wheezing at this time   I, Raylen Ken M, personally performed the services described in this documentation. All medical record  entries made by the scribe were at my direction and in my presence.  I have reviewed the chart and discharge instructions and agree that the record reflects my personal performance and is accurate and complete. Charls Custer M.  01/31/2015. 7:28 AM.       Azalia BilisKevin Vessie Olmsted, MD 01/31/15 [phone]

## 2015-01-31 NOTE — ED Notes (Addendum)
Pt to ED from home via [facility] c/o shortness of breath x 2 days. Hx of CHF and COPD; pt has taken neb treatments x 5 prior to EMS which increased coughing. Pt has received 5mg  Albuterol and 0.5 mg Atrovent by EMS. Also c/o fever at home and headache.

## 2015-02-01 ENCOUNTER — Inpatient Hospital Stay (HOSPITAL_COMMUNITY): Payer: Commercial Managed Care - HMO

## 2015-02-01 DIAGNOSIS — E785 Hyperlipidemia, unspecified: Secondary | ICD-10-CM

## 2015-02-01 DIAGNOSIS — R079 Chest pain, unspecified: Secondary | ICD-10-CM

## 2015-02-01 DIAGNOSIS — Z789 Other specified health status: Secondary | ICD-10-CM

## 2015-02-01 DIAGNOSIS — J441 Chronic obstructive pulmonary disease with (acute) exacerbation: Secondary | ICD-10-CM

## 2015-02-01 DIAGNOSIS — I2510 Atherosclerotic heart disease of native coronary artery without angina pectoris: Secondary | ICD-10-CM

## 2015-02-01 DIAGNOSIS — Z8659 Personal history of other mental and behavioral disorders: Secondary | ICD-10-CM

## 2015-02-01 DIAGNOSIS — I10 Essential (primary) hypertension: Secondary | ICD-10-CM

## 2015-02-01 LAB — COMPREHENSIVE METABOLIC PANEL
ALT: 17 U/L (ref 17–63)
ANION GAP: 8 (ref 5–15)
AST: 18 U/L (ref 15–41)
Albumin: 2.9 g/dL — ABNORMAL LOW (ref 3.5–5.0)
Alkaline Phosphatase: 57 U/L (ref 38–126)
BUN: 23 mg/dL — ABNORMAL HIGH (ref 6–20)
CALCIUM: 8.8 mg/dL — AB (ref 8.9–10.3)
CHLORIDE: 101 mmol/L (ref 101–111)
CO2: 25 mmol/L (ref 22–32)
Creatinine, Ser: 1.26 mg/dL — ABNORMAL HIGH (ref 0.61–1.24)
GFR calc non Af Amer: 54 mL/min — ABNORMAL LOW (ref >60)
Glucose, Bld: 196 mg/dL — ABNORMAL HIGH (ref 65–99)
Potassium: 4.1 mmol/L (ref 3.5–5.1)
SODIUM: 134 mmol/L — AB (ref 135–145)
Total Bilirubin: 0.9 mg/dL (ref 0.3–1.2)
Total Protein: 5.3 g/dL — ABNORMAL LOW (ref 6.5–8.1)

## 2015-02-01 LAB — GLUCOSE, CAPILLARY
Glucose-Capillary: 180 mg/dL — ABNORMAL HIGH (ref 65–99)
Glucose-Capillary: 183 mg/dL — ABNORMAL HIGH (ref 65–99)

## 2015-02-01 LAB — CBC
HCT: 43.9 % (ref 39.0–52.0)
HEMOGLOBIN: 14.7 g/dL (ref 13.0–17.0)
MCH: 29.9 pg (ref 26.0–34.0)
MCHC: 33.5 g/dL (ref 30.0–36.0)
MCV: 89.4 fL (ref 78.0–100.0)
PLATELETS: 144 10*3/uL — AB (ref 150–400)
RBC: 4.91 MIL/uL (ref 4.22–5.81)
RDW: 15.1 % (ref 11.5–15.5)
WBC: 20.5 10*3/uL — ABNORMAL HIGH (ref 4.0–10.5)

## 2015-02-01 MED ORDER — INSULIN ASPART 100 UNIT/ML ~~LOC~~ SOLN
0.0000 [IU] | Freq: Every day | SUBCUTANEOUS | Status: DC
  Administered 2015-02-02: 2 [IU] via SUBCUTANEOUS

## 2015-02-01 MED ORDER — FUROSEMIDE 10 MG/ML IJ SOLN: 40.0000 mg | Freq: Every day | INTRAMUSCULAR | Status: DC

## 2015-02-01 MED ORDER — SALINE SPRAY 0.65 % NA SOLN
1.0000 | NASAL | Status: DC | PRN
  Administered 2015-02-01 (×2): 1 via NASAL
  Filled 2015-02-01: qty 44

## 2015-02-01 MED ORDER — INSULIN ASPART 100 UNIT/ML ~~LOC~~ SOLN
0.0000 [IU] | Freq: Three times a day (TID) | SUBCUTANEOUS | Status: DC
  Administered 2015-02-01 – 2015-02-03 (×5): 3 [IU] via SUBCUTANEOUS
  Administered 2015-02-03: 2 [IU] via SUBCUTANEOUS

## 2015-02-01 MED ORDER — METHYLPREDNISOLONE SODIUM SUCC 125 MG IJ SOLR
60.0000 mg | Freq: Two times a day (BID) | INTRAMUSCULAR | Status: DC
  Administered 2015-02-01 – 2015-02-03 (×4): 60 mg via INTRAVENOUS
  Filled 2015-02-01 (×4): qty 2

## 2015-02-01 MED ORDER — ZOLPIDEM TARTRATE 5 MG PO TABS
5.0000 mg | ORAL_TABLET | Freq: Once | ORAL | Status: AC
  Administered 2015-02-01: 5 mg via ORAL
  Filled 2015-02-01: qty 1

## 2015-02-01 MED ORDER — FUROSEMIDE 10 MG/ML IJ SOLN
40.0000 mg | Freq: Every day | INTRAMUSCULAR | Status: AC
  Administered 2015-02-01 – 2015-02-02 (×2): 40 mg via INTRAVENOUS
  Filled 2015-02-01 (×2): qty 4

## 2015-02-01 MED ORDER — BENZONATATE 100 MG PO CAPS
100.0000 mg | ORAL_CAPSULE | Freq: Three times a day (TID) | ORAL | Status: DC
  Administered 2015-02-01 – 2015-02-03 (×6): 100 mg via ORAL
  Filled 2015-02-01 (×6): qty 1

## 2015-02-01 NOTE — Plan of Care (Signed)
Problem: Acute Rehab PT Goals(only PT should resolve) Goal: Pt Will Transfer Bed To Chair/Chair To Bed With safe technique Goal: Pt Will Ambulate With reciprocal gait pattern

## 2015-02-01 NOTE — Care Management Note (Addendum)
Case Management Note  Patient Details  Name: Mitchell BossierJarald Dean Jenkin MRN: [id number] Date of Birth: Aug 26, 1940  Subjective/Objective:      Date: 02/01/15 Spoke with patient at the bedside along with son , [name] [phone]. Introduced self as Sports coachcase manager and explained role in discharge planning and how to be reached. Verified patient lives in town, alone , has DME  [facility] nebulizer etc. Expressed  [facility] he would like to see if he could get a mask instead of nose prongs. Verified patient anticipates to go home alone at time of discharge and will have  part-time supervision by family friends neighbors at this time to best of their knowledge. Patient denied needing help with their medication. Patient drives  to MD appointments. Verified patient has PCP Donovan KailAllen Ross. Cleda DaubBetty Reece , ex wife will most likely be his transport at discharge  [phone].  [facility].   Plan: CM will continue to follow for discharge planning and [facility].               Action/Plan:   Expected Discharge Date:                  Expected Discharge Plan:  Home w Home Health Services  In-House Referral:     Discharge planning Services  CM Consult  Post Acute Care Choice:    Choice offered to:     DME Arranged:    DME Agency:     HH Arranged:    HH Agency:     Status of Service:  In process, will continue to follow  Medicare Important Message Given:    Date Medicare IM Given:    Medicare IM give by:    Date Additional Medicare IM Given:    Additional Medicare Important Message give by:     If discussed at Long Length of Stay Meetings, dates discussed:    Additional Comments:  Leone Havenaylor, Collins Dimaria Clinton, RN 02/01/2015, 4:06 PM

## 2015-02-01 NOTE — Evaluation (Signed)
Physical Therapy Evaluation Patient Details Name: Mitchell [name] MRN: [id number] DOB: 08/17/1940 Today's Date: 02/01/2015   History of Present Illness  74 yo male with onset of  LLe pain from a year ago, has been using SPC to manage at home.  Injured stepping into hole and straining R hip at the time.  Has also SIRS, sepsis since this amission with COPD exacerbation  Clinical Impression  Pt is weak but able to move, and will need some additional assistance at home.  His main issue with LLE is tightness in L thigh and with L ankle, restricting his control of LLE.  Pt is acustomed to accommodating this with gait but is in need of a follow up stretch program as he did stretch IT band with just mobility.      Follow Up Recommendations Home health PT    Equipment Recommendations  Other (comment) (assess ability to use [facility] next visit)    Recommendations for Other Services       Precautions / Restrictions Precautions Precautions: Fall (telemetry) Restrictions Weight Bearing Restrictions: No      Mobility  Bed Mobility Overal bed mobility: Modified Independent                Transfers Overall transfer level: Modified independent Equipment used:  (IV pole for steadying)             General transfer comment: reminders for hand placemennt  Ambulation/Gait Ambulation/Gait assistance: Independent Ambulation Distance (Feet): 125 Feet Assistive device:  (IV pole) Gait Pattern/deviations: Step-through pattern;Shuffle;Wide base of support Gait velocity: slower Gait velocity interpretation: Below normal speed for age/gender General Gait Details: Pt is accustomed to [facility] and did use IV pole in a minimal way, no issue with requiring assistance  Stairs            Wheelchair Mobility    Modified Rankin (Stroke Patients Only)       Balance Overall balance assessment: Needs assistance Sitting-balance support: Feet supported Sitting balance-Leahy Scale: Good    Postural control: Posterior lean Standing balance support: Single extremity supported Standing balance-Leahy Scale: Fair                               Pertinent Vitals/Pain Pain Assessment: 0-10 Pain Score: 10-Worst pain ever Pain Location: L lateral thigh Pain Descriptors / Indicators: Cramping Pain Intervention(s): Monitored during session;Premedicated before session    Home Living Family/patient expects to be discharged to:: Private residence Living Arrangements: Alone Available Help at Discharge: Other (Comment) (none per pt) Type of Home: House       Home Layout: One level Home Equipment: Grab bars - toilet;Grab bars - tub/shower;Cane - single point      Prior Function Level of Independence: Independent               Hand Dominance        Extremity/Trunk Assessment   Upper Extremity Assessment: Overall WFL for tasks assessed           Lower Extremity Assessment: Overall WFL for tasks assessed      Cervical / Trunk Assessment: Normal  Communication   Communication: No difficulties  Cognition Arousal/Alertness: Awake/alert Behavior During Therapy: WFL for tasks assessed/performed Overall Cognitive Status: Within Functional Limits for tasks assessed       Memory: Decreased recall of precautions;Decreased short-term memory              General Comments General comments (  skin integrity, edema, etc.): Pt was mildly SOB with exertion on 2L O2     Exercises        Assessment/Plan    PT Assessment Patient needs continued PT services  PT Diagnosis Difficulty walking;Acute pain   PT Problem List Decreased strength;Decreased range of motion;Decreased activity tolerance;Decreased balance;Decreased mobility;Decreased coordination;Decreased knowledge of use of DME;Decreased safety awareness;Decreased knowledge of precautions;Obesity;Pain  PT Treatment Interventions DME instruction;Gait training;Functional mobility  training;Therapeutic activities;Therapeutic exercise;Balance training;Neuromuscular re-education;Patient/family education   PT Goals (Current goals can be found in the Care Plan section) Acute Rehab PT Goals Patient Stated Goal: to have a little help at home PT Goal Formulation: With patient Time For Goal Achievement: 02/15/15 Potential to Achieve Goals: Good    Frequency Min 3X/week   Barriers to discharge Decreased caregiver support home alone with reduced endurance    Co-evaluation               End of Session Equipment Utilized During Treatment: Oxygen Activity Tolerance: Patient tolerated treatment well;Patient limited by pain Patient left: in bed;with call bell/phone within reach;with bed alarm set Nurse Communication: Mobility status         Time: [id number] PT Time Calculation (min) (ACUTE ONLY): 38 min   Charges:   PT Evaluation $Initial PT Evaluation Tier I: 1 Procedure PT Treatments $Gait Training: 8-22 mins $Therapeutic Exercise: 8-22 mins   PT G Codes:        Ivar DrapeStout, Virl Coble E 02/01/2015, 5:05 PM   Samul Dadauth Cassandria Drew, PT MS Acute Rehab Dept. Number: [facility] [phone] and [facility] [phone]

## 2015-02-01 NOTE — Progress Notes (Signed)
Utilization review completed. Powell Halbert, RN, BSN. 

## 2015-02-01 NOTE — Progress Notes (Signed)
[facility]                                                                              Patient Demographics  Mitchell [name], is a 74 y.o. male, DOB - 1941/02/28, SFK:[id number]  Admit date - 01/31/2015   Admitting Physician Norval Morton, MD  Outpatient Primary MD for the patient is [facility], [place]  LOS - 1   Chief Complaint  Patient presents with  . Shortness of Breath  . Congestive Heart Failure       Brief HPI   Per Dr. Tamala Julian on 01/31/15 Patient is a 74 year old Caucasian male with past medical history significant for COPD not requiring home oxygen, coronary artery disease, myocardial infarction, worst being legally blind, alcohol use, and obesity; who presents with a 2 day history of progressively worsening shortness of breath. Patient reported associated symptoms of a duct of cough with greenish sputum production, subjective fever, diaphoresis, and chest pain. Currently the patient feels as though he has a headache He reports that he tried taking at least 5 nebulized treatments at home without relief of symptoms. He notes no significant relief with the treatments. He reports living at home alone legally blind. Feels like he needs assistance at home and is not able to get it. Family which includes 3 sons live nearby.  His cardiologist Daneen Schick, MD reports last seeing him in April or May of this year. He reports that he took one full pill yesterday and that is instructed to take 1-2 depending on his weight. Fluid fills can make him lose up to 10 pounds as he states his dry weight is somewhere around 226. On presentation to the ED his weight is 234. BMPs were within normal limits. Lastly the patient complains of right foot pain and denies any recent trauma. Pain is experienced mostly with bearing weight.   Assessment & Plan    Principal Problem: SIRS/sepsis: SIRS criteria are met in regards to patient's tachycardia, tachypnea, and elevated what blood cell  count. Suspected respiratory source.  Active problems Acute COPD exacerbation (HCC) - Continue duo nebs scheduled, Pulmicort, IV Levaquin, flutter valve - Placed on Solu-Medrol 60 mg IV q12hrs  - Influenza panel negative - No blood cultures available. Home O2 evaluation prior to discharge.  - Question if patient has any pulmonary hypertension, right heart or underlying congestive heart failure, had been taking double dosing of his Lasix at home prior to admission for last 3 days. No prior echo in the system, obtain 2-D echocardiogram, place on IV Lasix for 2 days  - strict I's and O's and daily weights   Hypertension: Controlled at this time Continue home meds atenolol  CAD (coronary artery disease) with history of PCI and 2 previous MIs. -Continue Plavix and aspirin  Hyperlipemia: Chronic Patient with allergy to Lipitor and statins - Follow lipid panel   Vision impairment:chronic issue -Will have social worker assess for needs PTU to evaluation, home O2 evaluation prior to discharge   Obesity -Patient counseled strongly on diet and weight control    Alcohol use ([place])  Sinus tachycardia ([place]); Likely due to #1  Code Status:Full  CODE STATUS  Family Communication: Discussed in detail with the patient, all imaging results, lab results explained to the patient    Disposition Plan: Likely DC in 24-48 hours  Time Spent in minutes  25 minutes  Procedures  CXR  Consults   None   DVT Prophylaxis  Lovenox   Medications  Scheduled Meds: . antiseptic oral rinse  7 mL Mouth Rinse BID  . aspirin EC  81 mg Oral Daily  . atenolol  25 mg Oral q morning - 10a  . budesonide (PULMICORT) nebulizer solution  0.25 mg Nebulization BID  . clopidogrel  75 mg Oral Q breakfast  . diclofenac sodium  2 g Topical QID  . enoxaparin (LOVENOX) injection  40 mg Subcutaneous Q24H  . furosemide  20 mg Oral Daily  . ipratropium-albuterol  3 mL Nebulization Q4H  . levofloxacin (LEVAQUIN) IV  750  mg Intravenous Q24H  . sodium chloride  3 mL Intravenous Q12H  . tamsulosin  0.4 mg Oral QPC breakfast   Continuous Infusions:  PRN Meds:.acetaminophen **OR** acetaminophen, docusate sodium, HYDROcodone-acetaminophen, nitroGLYCERIN, ondansetron **OR** ondansetron (ZOFRAN) IV, polyethylene glycol   Antibiotics   Anti-infectives    Start     Dose/Rate Route Frequency Ordered Stop   01/31/15 1100  levofloxacin (LEVAQUIN) IVPB 750 mg     750 mg 100 mL/hr over 90 Minutes Intravenous Every 24 hours 01/31/15 1046          Subjective:   [name] was seen and examined today.  Still feeling short of breath and wheezing. Reports that he had been taking double doses of his Lasix. No chest pain. Denies any fevers or chills. Patient denies dizziness, abdominal pain, N/V/D/C, new weakness, numbess, tingling. No acute events overnight.    Objective:   Blood pressure 125/79, pulse 75, temperature 98 F (36.7 C), temperature source Oral, resp. rate 20, height 6' (1.829 m), weight 107.6 kg (237 lb 3.4 oz), SpO2 97 %.  Wt Readings from Last 3 Encounters:  02/01/15 107.6 kg (237 lb 3.4 oz)  10/04/14 106.323 kg (234 lb 6.4 oz)  04/30/13 102.513 kg (226 lb)     Intake/Output Summary (Last 24 hours) at 02/01/15 1422 Last data filed at 02/01/15 1338  Gross per 24 hour  Intake    483 ml  Output    775 ml  Net   -292 ml    Exam  General: Alert and oriented x 3, NAD  HEENT:  PERRLA, EOMI, Anicteric Sclera, mucous membranes moist.   Neck: Supple, no JVD, no masses  CVS: S1 S2 auscultated, no rubs, murmurs or gallops. Regular rate and rhythm.  Respiratory: Decreased breath sounds at the bases with scattered wheezing  Abdomen: Soft, nontender, nondistended, + bowel sounds  Ext: no cyanosis clubbing or edema  Neuro: AAOx3, Cr N's II- XII. Strength 5/5 upper and lower extremities bilaterally  Skin: No rashes  Psych: Normal affect and demeanor, alert and oriented x3    Data  Review   Micro Results No results found for this or any previous visit (from the past 240 hour(s)).  Radiology Reports Dg Chest 2 View  02/01/2015  CLINICAL DATA:  Short of breath EXAM: CHEST  2 VIEW COMPARISON:  Yesterday FINDINGS: Upper normal heart size. Left basilar atelectasis. Low volumes. No pneumothorax. IMPRESSION: Low volumes and left basilar atelectasis persist. Electronically Signed   By: Marybelle Killings M.D.   On: 02/01/2015 09:31   Dg Chest Portable 1 View  01/31/2015  CLINICAL DATA:  Shortness of breath, history of COPD and CHF. EXAM: PORTABLE CHEST 1 VIEW COMPARISON:  Radiograph 09/16/2012.  CT 01/22/2012 FINDINGS: Hyperinflation is unchanged from prior exam. Both costophrenic angles are excluded from the field of view. Heart at the upper limits normal in size, mediastinal contours are normal. No airspace consolidation, pulmonary edema, or pneumothorax. No large pleural effusion. Sclerotic densities projecting over the left upper hemithorax correspond to bone islands in the left posterior rib on prior CT. No acute osseous abnormalities are seen. IMPRESSION: Hyperinflation, unchanged from prior exam. No acute pulmonary process, allowing for both costophrenic angles being excluded. Electronically Signed   By: Jeb Levering M.D.   On: 01/31/2015 03:44    CBC  Recent Labs Lab 01/31/15 0405 02/01/15 0521  WBC 16.3* 20.5*  HGB 15.1 14.7  HCT 46.0 43.9  PLT 146* 144*  MCV 89.8 89.4  MCH 29.5 29.9  MCHC 32.8 33.5  RDW 15.1 15.1  LYMPHSABS 1.2  --   MONOABS 1.7*  --   EOSABS 0.0  --   BASOSABS 0.0  --     Chemistries   Recent Labs Lab 01/31/15 1105 01/31/15 1442 02/01/15 0521  NA 140 137 134*  K 4.2 4.0 4.1  CL 101 102 101  CO2 25 25 25   GLUCOSE 72 281* 196*  BUN 25* 23* 23*  CREATININE 1.36* 1.30* 1.26*  CALCIUM 9.3 9.4 8.8*  AST 22 25 18   ALT 20 19 17   ALKPHOS 62 53 57  BILITOT 0.9 0.9 0.9    ------------------------------------------------------------------------------------------------------------------ estimated creatinine clearance is 64.2 mL/min (by C-G formula based on Cr of 1.26). ------------------------------------------------------------------------------------------------------------------ No results for input(s): HGBA1C in the last 72 hours. ------------------------------------------------------------------------------------------------------------------ No results for input(s): CHOL, HDL, LDLCALC, TRIG, CHOLHDL, LDLDIRECT in the last 72 hours. ------------------------------------------------------------------------------------------------------------------  Recent Labs  01/31/15 1142  TSH 0.608   ------------------------------------------------------------------------------------------------------------------ No results for input(s): VITAMINB12, FOLATE, FERRITIN, TIBC, IRON, RETICCTPCT in the last 72 hours.  Coagulation profile No results for input(s): INR, PROTIME in the last 168 hours.  No results for input(s): DDIMER in the last 72 hours.  Cardiac Enzymes  Recent Labs Lab 01/31/15 0405  TROPONINI <0.03   ------------------------------------------------------------------------------------------------------------------ Invalid input(s): POCBNP  No results for input(s): GLUCAP in the last 72 hours.   Sallie Maker M.D. [facility] 02/01/2015, 2:22 PM  Pager: [phone] Between 7am to 7pm - call Pager - [phone]  After 7pm go to [email or web address] - password [email or web address]  Call night coverage person covering after 7pm

## 2015-02-02 ENCOUNTER — Inpatient Hospital Stay (HOSPITAL_COMMUNITY): Payer: Commercial Managed Care - HMO

## 2015-02-02 DIAGNOSIS — R0600 Dyspnea, unspecified: Secondary | ICD-10-CM

## 2015-02-02 LAB — CBC
HCT: 43.5 % (ref 39.0–52.0)
Hemoglobin: 14 g/dL (ref 13.0–17.0)
MCH: 28.7 pg (ref 26.0–34.0)
MCHC: 32.2 g/dL (ref 30.0–36.0)
MCV: 89.1 fL (ref 78.0–100.0)
PLATELETS: 154 10*3/uL (ref 150–400)
RBC: 4.88 MIL/uL (ref 4.22–5.81)
RDW: 15.2 % (ref 11.5–15.5)
WBC: 13.4 10*3/uL — ABNORMAL HIGH (ref 4.0–10.5)

## 2015-02-02 LAB — GLUCOSE, CAPILLARY
GLUCOSE-CAPILLARY: 169 mg/dL — AB (ref 65–99)
Glucose-Capillary: 160 mg/dL — ABNORMAL HIGH (ref 65–99)
Glucose-Capillary: 152 mg/dL — ABNORMAL HIGH (ref 65–99)
Glucose-Capillary: 226 mg/dL — ABNORMAL HIGH (ref 65–99)

## 2015-02-02 LAB — BASIC METABOLIC PANEL
Anion gap: 13 (ref 5–15)
BUN: 23 mg/dL — ABNORMAL HIGH (ref 6–20)
CO2: 25 mmol/L (ref 22–32)
CREATININE: 1.32 mg/dL — AB (ref 0.61–1.24)
Calcium: 9.1 mg/dL (ref 8.9–10.3)
Chloride: 100 mmol/L — ABNORMAL LOW (ref 101–111)
GFR calc Af Amer: 59 mL/min — ABNORMAL LOW (ref >60)
GFR, EST NON AFRICAN AMERICAN: 51 mL/min — AB (ref >60)
Glucose, Bld: 181 mg/dL — ABNORMAL HIGH (ref 65–99)
Potassium: 4.5 mmol/L (ref 3.5–5.1)
SODIUM: 138 mmol/L (ref 135–145)

## 2015-02-02 LAB — HEMOGLOBIN A1C
HEMOGLOBIN A1C: 5.9 % — AB (ref 4.8–5.6)
MEAN PLASMA GLUCOSE: 123 mg/dL

## 2015-02-02 MED ORDER — ZOLPIDEM TARTRATE 5 MG PO TABS
5.0000 mg | ORAL_TABLET | Freq: Every evening | ORAL | Status: DC | PRN
  Administered 2015-02-02: 5 mg via ORAL
  Filled 2015-02-02: qty 1

## 2015-02-02 NOTE — Progress Notes (Signed)
  Echocardiogram 2D Echocardiogram has been performed.  Arvil ChacoFoster, Tatiyana Foucher 02/02/2015, 12:26 PM

## 2015-02-02 NOTE — Consult Note (Signed)
Hue Frick EdD 

## 2015-02-02 NOTE — Consult Note (Signed)
CARDIOLOGY CONSULT NOTE   Patient ID: Mitchell [name] MRN: [id number] DOB/AGE: 07-15-1940 74 y.o.  Admit date: 01/31/2015  Primary Physician   Donovan Kail, MD Primary Cardiologist   Dr. Katrinka Blazing Reason for Consultation   Possible CHF  HPI: Mitchell [name] is a 74 y.o. male with a history of COPD, coronary artery disease with LAD cutting balloon angioplasty 2007 and DES PDA 2013, hypertension, hyperlipidemia, GERD who admitted 01/31/15 for COPD exacerbation and sepsis.  Last seen by Dr. Katrinka Blazing 10/04/14. At that time, he was doing well on cardiac stand point. The patient has been prescribed Lasix 20 mg daily by his primary care provider January/February 2016. However, he forgot to mention Dr. Katrinka Blazing when he saw him last time.  He has a chronic shortness of breath. Intermittent lower extremity edema, for which he takes additional Lasix. He is a very anxious person. He is partially blind due to cataract. For the past few weeks, he has been having more shortness of breath and abdominal tightness. For that, he is taking additional Lasix however no improvement. He admits to having orthopnea and mild upper chest discomfort, described as tightness. This chest pain is different from his previous cardiac pain. He denies PND, palpitations, melena, blood in his stool.  He presented to [facility] ED 01/31/15 for 2 days history of progressive worsening shortness of breath associated with cough and greenish sputum. He was admitted for acute COPD exacerbation and sepsis criteria. He was treated with nebs scheduled, Pulmicort and  IV Levaquin.   Cardiology is consulted for evaluation of possible congestive heart failure. His breathing has improved however not at baseline. Currently no chest pain. Complains of abdominal tightness. Patient reports dry weight is around 225lb. So far patient has given IV Lasix 20 mg 2 and IV Lasix 40 mg 2. Net I&O negative since admission. Creatinine ~1.3 since  admission. CXR yesterday showed low volumes and left basilar atelectasis. EKG 01/31/15 with sinus tachycardia at rate of 108bmp. No ST or T wave abnormality.   Echo 04/30/11 - LV EF of 60-65%, no WM abnormality. Septal motion showed abnormal function and dyssynergy. Pending reading of echo today.   Past Medical History  Diagnosis Date  . Hypertension   . Hyperlipidemia   . Depression   . BPH (benign prostatic hypertrophy)   . Anxiety   . GERD (gastroesophageal reflux disease)   . Coronary artery disease   . Myocardial infarction (HCC) 01/22/2012  . Anginal pain (HCC) 01/2012  . COPD (chronic obstructive pulmonary disease) (HCC)     "maybe" (02/14/2012)  . Shortness of breath     "walking and laying down sometimes" (02/14/2012)     Past Surgical History  Procedure Laterality Date  . Ganglion cyst removed  1956?    right  . Coronary angioplasty with stent placement  05/11/2005    PCI OF LAD  . Cataract extraction w/ intraocular lens  implant, bilateral  ~ 2010  . Refractive surgery  2010    left  . Left heart catheterization with coronary angiogram Bilateral 01/24/2012    Procedure: LEFT HEART CATHETERIZATION WITH CORONARY ANGIOGRAM;  Surgeon: Lesleigh Noe, MD;  Location: [facility] CATH LAB;  Service: Cardiovascular;  Laterality: Bilateral;    Allergies  Allergen Reactions  . Lipitor [Atorvastatin] Other (See Comments)    "just paralyzed me; I can't take it"  . Statins Other (See Comments)    "paralyzed me; I can't take it"    I have reviewed  the patient's current medications . antiseptic oral rinse  7 mL Mouth Rinse BID  . aspirin EC  81 mg Oral Daily  . atenolol  25 mg Oral q morning - 10a  . benzonatate  100 mg Oral TID  . budesonide (PULMICORT) nebulizer solution  0.25 mg Nebulization BID  . clopidogrel  75 mg Oral Q breakfast  . diclofenac sodium  2 g Topical QID  . enoxaparin (LOVENOX) injection  40 mg Subcutaneous Q24H  . insulin aspart  0-15 Units Subcutaneous TID  WC  . insulin aspart  0-5 Units Subcutaneous QHS  . ipratropium-albuterol  3 mL Nebulization Q4H  . levofloxacin (LEVAQUIN) IV  750 mg Intravenous Q24H  . methylPREDNISolone (SOLU-MEDROL) injection  60 mg Intravenous Q12H  . sodium chloride  3 mL Intravenous Q12H  . tamsulosin  0.4 mg Oral QPC breakfast     acetaminophen **OR** acetaminophen, docusate sodium, HYDROcodone-acetaminophen, nitroGLYCERIN, ondansetron **OR** ondansetron (ZOFRAN) IV, polyethylene glycol, sodium chloride  Prior to Admission medications   Medication Sig Start Date End Date Taking? Authorizing Provider  albuterol (PROVENTIL HFA;VENTOLIN HFA) 108 (90 BASE) MCG/ACT inhaler Inhale 2 puffs into the lungs every 6 (six) hours as needed for wheezing. For shortness of breath   Yes Historical Provider, MD  albuterol (PROVENTIL) (2.5 MG/3ML) 0.083% nebulizer solution Take 3 mLs (2.5 mg total) by nebulization every 6 (six) hours as needed for wheezing. 07/14/12  Yes Blake Divine, MD  aspirin EC 81 MG EC tablet Take 1 tablet (81 mg total) by mouth daily. 02/15/12  Yes Corky Crafts, MD  atenolol (TENORMIN) 25 MG tablet Take 25 mg by mouth every morning.    Yes Historical Provider, MD  budesonide-formoterol (SYMBICORT) 160-4.5 MCG/ACT inhaler Inhale 2 puffs into the lungs 2 (two) times daily.   Yes Historical Provider, MD  clopidogrel (PLAVIX) 75 MG tablet Take 1 tablet (75 mg total) by mouth daily with breakfast. 02/15/12  Yes Corky Crafts, MD  diclofenac sodium (VOLTAREN) 1 % GEL Apply topically 4 (four) times daily.   Yes Historical Provider, MD  Fluticasone-Salmeterol (ADVAIR) 250-50 MCG/DOSE AEPB Inhale 1 puff into the lungs 2 (two) times daily.   Yes Historical Provider, MD  furosemide (LASIX) 20 MG tablet Take 20 mg by mouth daily. Fluid pill 02/11/13  Yes Historical Provider, MD  mometasone-formoterol (DULERA) 100-5 MCG/ACT AERO Inhale 2 puffs into the lungs 2 (two) times daily.   Yes Historical Provider, MD    nitroGLYCERIN (NITROSTAT) 0.4 MG SL tablet Place 0.4 mg under the tongue every 5 (five) minutes as needed. FOR CHEST PAIN   Yes Historical Provider, MD  tamsulosin (FLOMAX) 0.4 MG CAPS capsule Take 0.4 mg by mouth daily after breakfast.  03/19/13  Yes Historical Provider, MD     Social History   Social History  . Marital Status: Single    Spouse Name: N/A  . Number of Children: N/A  . Years of Education: N/A   Occupational History  . Not on file.   Social History Main Topics  . Smoking status: Former Smoker -- 42 years    Types: Pipe    Quit date: 04/28/2011  . Smokeless tobacco: Never Used  . Alcohol Use: 3.5 oz/week    7 Standard drinks or equivalent per week     Comment: 02/14/2012 "weekends I have 6-7 mixed drinks"     occasional  . Drug Use: No  . Sexual Activity: Yes   Other Topics Concern  . Not on file  Social History Narrative   Truck driver          Family Status  Relation Status Death Age  . Mother Deceased     MI  . Brother Alive     CAD  . Father Deceased     CVA   Family History  Problem Relation Age of Onset  . Heart failure Mother   . Heart failure Brother      ROS:  Full 14 point review of systems complete and found to be negative unless listed above.  Physical Exam: Blood pressure 143/73, pulse 67, temperature 97.7 F (36.5 C), temperature source Oral, resp. rate 20, height 6' (1.829 m), weight 233 lb 3.2 oz (105.779 kg), SpO2 95 %.  General: Well developed, well nourished, anxious male in no acute distress Head: Eyes PERRLA, No xanthomas. Normocephalic and atraumatic, oropharynx without edema or exudate.  Lungs: Resp regular and unlabored. Diminished breath sound throughout without rales or wheezing.  Heart: RRR no s3, s4, or murmurs..   Neck: No carotid bruits. No lymphadenopathy.  JVD difficult to assess due to girth.  Abdomen: Bowel sounds present, abdomen distended.  Msk:  No spine or cva tenderness. No weakness, no joint  deformities or effusions. Extremities: No clubbing, cyanosis. Trace BL LE edema. DP/PT/Radials 2+ and equal bilaterally. Neuro: Alert and oriented X 3. No focal deficits noted. Psych:  Good affect, responds appropriately Skin: No rashes or lesions noted.  Labs:   Lab Results  Component Value Date   WBC 13.4* 02/02/2015   HGB 14.0 02/02/2015   HCT 43.5 02/02/2015   MCV 89.1 02/02/2015   PLT 154 02/02/2015   No results for input(s): INR in the last 72 hours.  Recent Labs Lab 02/01/15 0521 02/02/15 0531  NA 134* 138  K 4.1 4.5  CL 101 100*  CO2 25 25  BUN 23* 23*  CREATININE 1.26* 1.32*  CALCIUM 8.8* 9.1  PROT 5.3*  --   BILITOT 0.9  --   ALKPHOS 57  --   ALT 17  --   AST 18  --   GLUCOSE 196* 181*  ALBUMIN 2.9*  --    No results found for: MG  Recent Labs  01/31/15 0405  TROPONINI <0.03   BNP (last 3 results)  Recent Labs  01/31/15 0405  BNP 40.9   Lab Results  Component Value Date   TSH 0.608 01/31/2015   ProBNP (last 3 results) No results for input(s): PROBNP in the last 8760 hours.   Lipid Panel     Component Value Date/Time   CHOL 167 04/30/2013 1043   TRIG 75.0 04/30/2013 1043   HDL 64.90 04/30/2013 1043   CHOLHDL 3 04/30/2013 1043   VLDL 15.0 04/30/2013 1043   LDLCALC 87 04/30/2013 1043     Echo: 02/02/15 LV EF: 55% -  60%  ------------------------------------------------------------------- Indications:   Dyspnea 786.09.  ------------------------------------------------------------------- History:  PMH:  Chest pain. Coronary artery disease. Chronic obstructive pulmonary disease. Risk factors: Hypertension. Dyslipidemia.  ------------------------------------------------------------------- Study Conclusions  - Left ventricle: The cavity size was normal. Wall thickness was normal. Systolic function was normal. The estimated ejection fraction was in the range of 55% to 60%. Wall motion was normal; there were no  regional wall motion abnormalities. Left ventricular diastolic function parameters were normal.  ECG:  Vent. rate 108 BPM PR interval 163 ms QRS duration 87 ms QT/QTc 350/469 ms P-R-T axes 71 6 64  Radiology:  Dg Chest 2 View  02/01/2015  CLINICAL DATA:  Short of breath EXAM: CHEST  2 VIEW COMPARISON:  Yesterday FINDINGS: Upper normal heart size. Left basilar atelectasis. Low volumes. No pneumothorax. IMPRESSION: Low volumes and left basilar atelectasis persist. Electronically Signed   By: Jolaine Click M.D.   On: 02/01/2015 09:31    ASSESSMENT AND PLAN:     1. Worsening dyspnea to COPD exacerbation. - Presented with worsening shortness of breath and cough with green sputum. - His breathing has improved but not at baseline. He said that his nebulizer treatment does not work at home. - COPD management per primary. - There is a concern regarding underlying congestive heart failure. He does have a history of lower extremity edema with fluid buildup in his abdomen. He takes Lasix by mouth 20 mg daily. He also takes an additional Lasix if worsening of lower extremity edema or abdominal tightness. Patient reports dry weight is around 225lb. So far patient has given IV Lasix 20 mg 2 and IV Lasix 40 mg 2. Net I&O negative since admission. Creatinine ~1.3 since admission. Chest x-ray without pulmonary congestion. No rales on auscultation of lungs. Trace lower extremity edema. BNP of 40.9. TSH normal.  - Echo 04/30/11 - LV EF of 60-65%, no WM abnormality. Septal motion showed abnormal function and dyssynergy - Repeat echo today 55-60%, no WM abnormality. RV function is normal.  - His shortness of breath most likely from COPD given reassuring most recent echocardiogram today. Consider GI etiology for distended abdomen. Further recommendation regarding daily dose of Lasix per M.D.  1. Hypertension - Relatively stable. Continue BB    2. Chest pain - Likely pruritus take with cough and COPD  exacerbation. His pain is different from previous cardiac pain, which he described as a something sitting on his chest. Currently chest pain-free. Top  3. CAD (coronary artery disease) - LAD cutting balloon angioplasty 2007 and DES PDA 2013 - No anginal pain. - Continue aspirin, atenolol, Plavix - Not on statin due to allergies.  4. Hyperlipemia - No results found for requested labs within last 365 days.  - AST & ALT normal.  - Consider lipid clinic evaluation as outpatient.  5.   Obesity - discussed lifestyle modification at length.   6. Hyperglycemia - HgbA1C of 5.9. Hemoglobin benefit from dietary counseling.   Otherwise per IM:   History of depression   Vision impairment   Right foot pain   Alcohol use (HCC)   Dispo: He will benefit to from medication education. I have advised him to bring all his home medication during outpatient office visit.     SignedManson Passey, PA 02/02/2015, 2:26 PM Pager [phone]  Co-Sign MD  Patient seen and examined. Agree with assessment and plan. Mitchell [name] is a 68 Caucasian male who is followed by Dr. Miguel [name], for primary care and Dr. Garnette Scheuermann for cardiology care.  The patient has established CAD and underwent Cutting Balloon PCI to his LAD in 2007 and DES stenting of his PDA in 2013.  He has a history of COPD, hypertension, hyperlipidemia, as well as GERD.  The patient admits to chronic shortness of breath and has noticed intermittent leg edema for which he takes Lasix on an as-needed basis.  He was admitted following a 2 day history of progressive shortness of breath with cough and development of greenish sputum.  He is been treated with nebulizers, Pulmicort, and Levaquin.  We are asked to see him to evaluate potential for CHF in the setting of his  COPD.  He denies any anginal type symptoms or any of the chest pressure that he experienced prior to his coronary interventions.  A 2-D echo Doppler study today confirms normal  systolic and diastolic function and did not reveal any significant valvular pathology.  Of note, his estimated CVP was 3 cm which is dry.   His BNP is 40 which excludes congestive heart failure.  Troponins are negative.  Thyroid function is normal. His ECG from 10/17 which was independently reviewed by me reveals sinus tachycardia at 108, without ST segment changes or ischemic findings.  A chest x-ray did not reveal any signs of CHF with low lung volumes and left basilar atelectasis.  The patient has some confusion of his home meds and these would need to be reviewed and adjusted if necessary prior to discharge.    Lennette Biharihomas A. Jezel Basto, MD, [facility] OrleansFACC 02/02/2015 3:56 PM

## 2015-02-02 NOTE — Progress Notes (Signed)
[facility]                                                                              Patient Demographics  Mitchell [name], is a 74 y.o. male, DOB - 1940-10-10, QBH:[id number]  Admit date - 01/31/2015   Admitting Physician Norval Morton, MD  Outpatient Primary MD for the patient is [facility], MD  LOS - 2   Chief Complaint  Patient presents with  . Shortness of Breath  . Congestive Heart Failure       Brief HPI   Per Dr. Tamala Julian on 01/31/15 Patient is a 74 year old Caucasian male with past medical history significant for COPD not requiring home oxygen, coronary artery disease, myocardial infarction, worst being legally blind, alcohol use, and obesity; who presents with a 2 day history of progressively worsening shortness of breath. Patient reported associated symptoms of a duct of cough with greenish sputum production, subjective fever, diaphoresis, and chest pain. Currently the patient feels as though he has a headache He reports that he tried taking at least 5 nebulized treatments at home without relief of symptoms. He notes no significant relief with the treatments. He reports living at home alone legally blind. Feels like he needs assistance at home and is not able to get it. Family which includes 3 sons live nearby.  His cardiologist Daneen Schick, MD reports last seeing him in April or May of this year. He reports that he took one full pill yesterday and that is instructed to take 1-2 depending on his weight. Fluid fills can make him lose up to 10 pounds as he states his dry weight is somewhere around 226. On presentation to the ED his weight is 234. BMPs were within normal limits. Lastly the patient complains of right foot pain and denies any recent trauma. Pain is experienced mostly with bearing weight.   Assessment & Plan    Principal Problem: SIRS/sepsis: SIRS criteria are met in regards to patient's tachycardia, tachypnea, and elevated what blood cell  count. Suspected respiratory source.  Active problems Dyspnea secondary to Acute COPD exacerbation ([place]), ? Underlying CHF  - Continue duo nebs scheduled, Pulmicort, IV Levaquin, flutter valve - Placed on Solu-Medrol 60 mg IV q12hrs  - Influenza panel negative - No blood cultures available. Home O2 evaluation prior to discharge.  - Question if patient has any pulmonary hypertension, right heart or underlying congestive heart failure, had been taking double dosing of his Lasix at home prior to admission for last 3 days. No prior echo in the system, 2-D echo done, results pending  - Continue IV Lasix, patient reports that his dry weight is around 225lbs , cardiology consult called.  - Home O2 evaluation prior to DC - No hypoxia or tachycardia to suggest any pulmonary embolism     Hypertension: Controlled at this time Continue home meds atenolol  CAD (coronary artery disease) with history of PCI and 2 previous MIs. -Continue Plavix and aspirin  Hyperlipemia: - Allergic to Lipitor and statins  Vision impairment:chronic issue -PT evaluation and treatment at home PT  - home O2 evaluation prior to discharge   Obesity -Patient counseled  strongly on diet and weight control    Alcohol use ([place]) - Patient reports occasional alcohol use  Sinus tachycardia ([place]); Likely due to #1  Code Status:Full CODE STATUS  Family Communication: Discussed in detail with the patient, all imaging results, lab results explained to the patient    Disposition Plan: Likely DC in 24-48 hours  Time Spent in minutes  25 minutes  Procedures  CXR  Consults   None   DVT Prophylaxis  Lovenox   Medications  Scheduled Meds: . antiseptic oral rinse  7 mL Mouth Rinse BID  . aspirin EC  81 mg Oral Daily  . atenolol  25 mg Oral q morning - 10a  . benzonatate  100 mg Oral TID  . budesonide (PULMICORT) nebulizer solution  0.25 mg Nebulization BID  . clopidogrel  75 mg Oral Q breakfast  . diclofenac  sodium  2 g Topical QID  . enoxaparin (LOVENOX) injection  40 mg Subcutaneous Q24H  . insulin aspart  0-15 Units Subcutaneous TID WC  . insulin aspart  0-5 Units Subcutaneous QHS  . ipratropium-albuterol  3 mL Nebulization Q4H  . levofloxacin (LEVAQUIN) IV  750 mg Intravenous Q24H  . methylPREDNISolone (SOLU-MEDROL) injection  60 mg Intravenous Q12H  . sodium chloride  3 mL Intravenous Q12H  . tamsulosin  0.4 mg Oral QPC breakfast   Continuous Infusions:  PRN Meds:.acetaminophen **OR** acetaminophen, docusate sodium, HYDROcodone-acetaminophen, nitroGLYCERIN, ondansetron **OR** ondansetron (ZOFRAN) IV, polyethylene glycol, sodium chloride   Antibiotics   Anti-infectives    Start     Dose/Rate Route Frequency Ordered Stop   01/31/15 1100  levofloxacin (LEVAQUIN) IVPB 750 mg     750 mg 100 mL/hr over 90 Minutes Intravenous Every 24 hours 01/31/15 1046          Subjective:   Mitchell [name] was seen and examined today.  No wheezing however still feeling short of breath.  No chest pain. Denies any fevers or chills. Patient denies dizziness, abdominal pain, N/V/D/C, new weakness, numbess, tingling. No acute events overnight.    Objective:   Blood pressure 143/81, pulse 73, temperature 98.4 F (36.9 C), temperature source Oral, resp. rate 20, height 6' (1.829 m), weight 105.779 kg (233 lb 3.2 oz), SpO2 95 %.  Wt Readings from Last 3 Encounters:  02/02/15 105.779 kg (233 lb 3.2 oz)  10/04/14 106.323 kg (234 lb 6.4 oz)  04/30/13 102.513 kg (226 lb)     Intake/Output Summary (Last 24 hours) at 02/02/15 1251 Last data filed at 02/02/15 0350  Gross per 24 hour  Intake    243 ml  Output    550 ml  Net   -307 ml    Exam  General: Alert and oriented x 3, NAD  HEENT:  PERRLA, EOMI, Anicteric Sclera, mucous membranes moist.   Neck: Supple, no JVD, no masses  CVS: S1 S2 auscultated, no rubs, murmurs or gallops. Regular rate and rhythm.  Respiratory: Decreased breath sounds  throughout otherwise no wheezing today  Abdomen: Soft, nontender, nondistended, + bowel sounds  Ext: no cyanosis clubbing or edema  Neuro: no new deficits  Skin: No rashes  Psych: Normal affect and demeanor, alert and oriented x3    Data Review   Micro Results No results found for this or any previous visit (from the past 240 hour(s)).  Radiology Reports Dg Chest 2 View  02/01/2015  CLINICAL DATA:  Short of breath EXAM: CHEST  2 VIEW COMPARISON:  Yesterday FINDINGS: Upper normal heart size. Left  basilar atelectasis. Low volumes. No pneumothorax. IMPRESSION: Low volumes and left basilar atelectasis persist. Electronically Signed   By: Marybelle Killings M.D.   On: 02/01/2015 09:31   Dg Chest Portable 1 View  01/31/2015  CLINICAL DATA:  Shortness of breath, history of COPD and CHF. EXAM: PORTABLE CHEST 1 VIEW COMPARISON:  Radiograph 09/16/2012.  CT 01/22/2012 FINDINGS: Hyperinflation is unchanged from prior exam. Both costophrenic angles are excluded from the field of view. Heart at the upper limits normal in size, mediastinal contours are normal. No airspace consolidation, pulmonary edema, or pneumothorax. No large pleural effusion. Sclerotic densities projecting over the left upper hemithorax correspond to bone islands in the left posterior rib on prior CT. No acute osseous abnormalities are seen. IMPRESSION: Hyperinflation, unchanged from prior exam. No acute pulmonary process, allowing for both costophrenic angles being excluded. Electronically Signed   By: Jeb Levering M.D.   On: 01/31/2015 03:44    CBC  Recent Labs Lab 01/31/15 0405 02/01/15 0521 02/02/15 0531  WBC 16.3* 20.5* 13.4*  HGB 15.1 14.7 14.0  HCT 46.0 43.9 43.5  PLT 146* 144* 154  MCV 89.8 89.4 89.1  MCH 29.5 29.9 28.7  MCHC 32.8 33.5 32.2  RDW 15.1 15.1 15.2  LYMPHSABS 1.2  --   --   MONOABS 1.7*  --   --   EOSABS 0.0  --   --   BASOSABS 0.0  --   --     Chemistries   Recent Labs Lab 01/31/15 1105  01/31/15 1442 02/01/15 0521 02/02/15 0531  NA 140 137 134* 138  K 4.2 4.0 4.1 4.5  CL 101 102 101 100*  CO2 _0 GLUCOSE 72 281* 196* 181*  BUN 25* 23* 23* 23*  CREATININE 1.36* 1.30* 1.26* 1.32*  CALCIUM 9.3 9.4 8.8* 9.1  AST _1 --   ALT _2 --   ALKPHOS 62 53 57  --   BILITOT 0.9 0.9 0.9  --    ------------------------------------------------------------------------------------------------------------------ estimated creatinine clearance is 60.8 mL/min (by C-G formula based on Cr of 1.32). ------------------------------------------------------------------------------------------------------------------  Recent Labs  02/01/15 0521  HGBA1C 5.9*   ------------------------------------------------------------------------------------------------------------------ No results for input(s): CHOL, HDL, LDLCALC, TRIG, CHOLHDL, LDLDIRECT in the last 72 hours. ------------------------------------------------------------------------------------------------------------------  Recent Labs  01/31/15 1142  TSH 0.608   ------------------------------------------------------------------------------------------------------------------ No results for input(s): VITAMINB12, FOLATE, FERRITIN, TIBC, IRON, RETICCTPCT in the last 72 hours.  Coagulation profile No results for input(s): INR, PROTIME in the last 168 hours.  No results for input(s): DDIMER in the last 72 hours.  Cardiac Enzymes  Recent Labs Lab 01/31/15 0405  TROPONINI <0.03   ------------------------------------------------------------------------------------------------------------------ Invalid input(s): POCBNP   Recent Labs  02/01/15 1805 02/01/15 2128 02/02/15 0730 02/02/15 1126  GLUCAP 180* 183* 169* 160*     Varetta Chavers M.D. [facility] 02/02/2015, 12:51 PM  Pager: [phone] Between 7am to 7pm - call Pager - [phone]  After 7pm go to [email or web address] - password [email or web address]  Call  night coverage person covering after 7pm

## 2015-02-03 LAB — GLUCOSE, CAPILLARY
Glucose-Capillary: 149 mg/dL — ABNORMAL HIGH (ref 65–99)
Glucose-Capillary: 154 mg/dL — ABNORMAL HIGH (ref 65–99)

## 2015-02-03 LAB — BASIC METABOLIC PANEL
Anion gap: 8 (ref 5–15)
BUN: 24 mg/dL — AB (ref 6–20)
CHLORIDE: 102 mmol/L (ref 101–111)
CO2: 27 mmol/L (ref 22–32)
CREATININE: 1.21 mg/dL (ref 0.61–1.24)
Calcium: 8.7 mg/dL — ABNORMAL LOW (ref 8.9–10.3)
GFR calc Af Amer: >60 mL/min (ref >60)
GFR calc non Af Amer: 57 mL/min — ABNORMAL LOW (ref >60)
Glucose, Bld: 158 mg/dL — ABNORMAL HIGH (ref 65–99)
POTASSIUM: 4.1 mmol/L (ref 3.5–5.1)
SODIUM: 137 mmol/L (ref 135–145)

## 2015-02-03 LAB — CBC
HEMATOCRIT: 44.9 % (ref 39.0–52.0)
HEMOGLOBIN: 14.8 g/dL (ref 13.0–17.0)
MCH: 29.5 pg (ref 26.0–34.0)
MCHC: 33 g/dL (ref 30.0–36.0)
MCV: 89.4 fL (ref 78.0–100.0)
Platelets: 158 10*3/uL (ref 150–400)
RBC: 5.02 MIL/uL (ref 4.22–5.81)
RDW: 15 % (ref 11.5–15.5)
WBC: 12.4 10*3/uL — ABNORMAL HIGH (ref 4.0–10.5)

## 2015-02-03 MED ORDER — FUROSEMIDE 20 MG PO TABS
20.0000 mg | ORAL_TABLET | Freq: Every day | ORAL | Status: DC
Start: 2015-02-03 — End: 2015-02-03

## 2015-02-03 MED ORDER — FUROSEMIDE 20 MG PO TABS: 20.0000 mg | ORAL_TABLET | Freq: Every day | ORAL | Status: DC | PRN

## 2015-02-03 MED ORDER — BENZONATATE 100 MG PO CAPS: 100.0000 mg | ORAL_CAPSULE | Freq: Three times a day (TID) | ORAL | Status: DC | PRN

## 2015-02-03 MED ORDER — PREDNISONE 50 MG PO TABS
60.0000 mg | ORAL_TABLET | Freq: Every day | ORAL | Status: DC
  Administered 2015-02-03: 60 mg via ORAL
  Filled 2015-02-03 (×2): qty 1

## 2015-02-03 MED ORDER — GUAIFENESIN ER 600 MG PO TB12
1200.0000 mg | ORAL_TABLET | Freq: Two times a day (BID) | ORAL | Status: DC
Start: 2015-02-03 — End: 2015-02-03
  Administered 2015-02-03: 1200 mg via ORAL
  Filled 2015-02-03: qty 2

## 2015-02-03 MED ORDER — LEVOFLOXACIN 750 MG PO TABS
750.0000 mg | ORAL_TABLET | Freq: Every day | ORAL | Status: DC
Start: 2015-02-03 — End: 2015-03-07

## 2015-02-03 MED ORDER — PREDNISONE 10 MG PO TABS: ORAL_TABLET | ORAL | Status: DC

## 2015-02-03 MED ORDER — LEVOFLOXACIN 750 MG PO TABS
750.0000 mg | ORAL_TABLET | Freq: Every day | ORAL | Status: DC
  Administered 2015-02-03: 750 mg via ORAL
  Filled 2015-02-03: qty 1

## 2015-02-03 MED ORDER — ALBUTEROL SULFATE (2.5 MG/3ML) 0.083% IN NEBU: 2.5000 mg | INHALATION_SOLUTION | Freq: Three times a day (TID) | RESPIRATORY_TRACT | Status: DC

## 2015-02-03 MED ORDER — FUROSEMIDE 20 MG PO TABS: 20.0000 mg | ORAL_TABLET | Freq: Every day | ORAL | Status: DC

## 2015-02-03 MED ORDER — GUAIFENESIN ER 600 MG PO TB12: 1200.0000 mg | ORAL_TABLET | Freq: Two times a day (BID) | ORAL | Status: DC

## 2015-02-03 NOTE — Progress Notes (Signed)
Walking O2 sat done with NT, Tayla. O2 saturation ranged from 91-96% on room air while ambulating around unit. Gavin Poundeborah, CM notified. Will get pt ready for d/c.

## 2015-02-03 NOTE — Clinical Social Work Note (Signed)
Clinical Social Worker received referral for assistance at home.  Chart reviewed.  PT/OT recommending home with home health.  Spoke with RN Case Manager who will follow up with patient to discuss home health needs.    CSW signing off - please re consult if social work needs arise.  Macario GoldsJesse Gennaro [place], [place] [phone]

## 2015-02-03 NOTE — Progress Notes (Signed)
NURSING PROGRESS NOTE  Mitchell [name] [id number] Discharge Data: 02/03/2015 11:49 AM Attending Provider: Cathren Harshipudeep K Rai, MD WUJ:WJXB,JYNWGPCP:ROSS,ALLeN, MD     Mitchell [name] to be D/C'd Home per MD order.  Discussed with the patient the After Visit Summary and all questions fully answered. All IV's discontinued with no bleeding noted. All belongings returned to patient for patient to take home. O2 tank and four point cane sent home with pt.  Last Vital Signs:  Blood pressure 143/84, pulse 82, temperature 98.1 F (36.7 C), temperature source Oral, resp. rate 18, height 6' (1.829 m), weight 105.779 kg (233 lb 3.2 oz), SpO2 93 %.  Discharge Medication List   Medication List    STOP taking these medications        diclofenac sodium 1 % Gel  Commonly known as:  VOLTAREN     Fluticasone-Salmeterol 250-50 MCG/DOSE Aepb  Commonly known as:  ADVAIR      TAKE these medications        albuterol (2.5 MG/3ML) 0.083% nebulizer solution  Commonly known as:  PROVENTIL  Take 3 mLs (2.5 mg total) by nebulization 3 (three) times daily.     albuterol 108 (90 BASE) MCG/ACT inhaler  Commonly known as:  PROVENTIL HFA;VENTOLIN HFA  Inhale 2 puffs into the lungs every 6 (six) hours as needed for wheezing. For shortness of breath     aspirin 81 MG EC tablet  Take 1 tablet (81 mg total) by mouth daily.     atenolol 25 MG tablet  Commonly known as:  TENORMIN  Take 25 mg by mouth every morning.     benzonatate 100 MG capsule  Commonly known as:  TESSALON  Take 1 capsule (100 mg total) by mouth 3 (three) times daily as needed for cough.     budesonide-formoterol 160-4.5 MCG/ACT inhaler  Commonly known as:  SYMBICORT  Inhale 2 puffs into the lungs 2 (two) times daily.     clopidogrel 75 MG tablet  Commonly known as:  PLAVIX  Take 1 tablet (75 mg total) by mouth daily with breakfast.     furosemide 20 MG tablet  Commonly known as:  LASIX  Take 1 tablet (20 mg total) by mouth daily. Fluid pill     guaiFENesin 600 MG 12 hr tablet  Commonly known as:  MUCINEX  Take 2 tablets (1,200 mg total) by mouth 2 (two) times daily.     levofloxacin 750 MG tablet  Commonly known as:  LEVAQUIN  Take 1 tablet (750 mg total) by mouth daily. X 7days     mometasone-formoterol 100-5 MCG/ACT Aero  Commonly known as:  DULERA  Inhale 2 puffs into the lungs 2 (two) times daily.     nitroGLYCERIN 0.4 MG SL tablet  Commonly known as:  NITROSTAT  Place 0.4 mg under the tongue every 5 (five) minutes as needed. FOR CHEST PAIN     predniSONE 10 MG tablet  Commonly known as:  DELTASONE  Prednisone dosing: Take  Prednisone 40mg  (4 tabs) x 3 days, then taper to 30mg  (3 tabs) x 3 days, then 20mg  (2 tabs) x 3days, then 10mg  (1 tab) x 3days, then OFF.  Dispense:  30 tabs, refills: None  Start taking on:  02/04/2015     tamsulosin 0.4 MG Caps capsule  Commonly known as:  FLOMAX  Take 0.4 mg by mouth daily after breakfast.         Bennie Pieriniyndi Arieanna Pressey, RN

## 2015-02-03 NOTE — Care Management Note (Addendum)
Case Management Note  Patient Details  Name: Mitchell [name] MRN: [id number] Date of Birth: 06/10/1940  Subjective/Objective:    Patient for dc today, [facility], and neb machine. Ref[facility] made to [facility], Pt, OT, aide . Soc will begin 24-48 hrs post dc. Also [facility], which will be brought up to patient's room. Patient states he does not need a neb machine he has one at home. Patient will need home oxygen and a large base quad cane. This [facility] also [facility] also [facility].               Action/Plan:   Expected Discharge Date:                  Expected Discharge Plan:  Home w Home Health Services  In-House Referral:     Discharge planning Services  CM Consult  Post Acute Care Choice:  Home Health Choice offered to:  Patient  DME Arranged:  Oxygen DME Agency:  [facility]  HH Arranged:  RN, PT, OT, Nurse's Aide HH Agency:  [facility]  Status of Service:  Completed, signed off  Medicare Important Message Given:    Date Medicare IM Given:    Medicare IM give by:    Date Additional Medicare IM Given:    Additional Medicare Important Message give by:     If discussed at Long Length of Stay Meetings, dates discussed:    Additional Comments:  Leone Havenaylor, Deborah Clinton, RN 02/03/2015, 12:19 PM

## 2015-02-03 NOTE — Consult Note (Signed)
   [facility] Consult   02/03/2015  Mitchell [name] [birth year] [id number] Patient evaluated for community based chronic disease management services with [facility] as a benefit of patient's [facility] Medicare Insurance. Spoke with patient at bedside to explain [facility] Care [facility] services.  Patient endorses that his primary care provider is Dr. Miguel AschoffAllan Ross.  Patient lives alone and is legally blind.  Consent forms signed.   Patient will receive post discharge transition of care call and will be evaluated for monthly home visits for assessments and disease process education.  Left contact information and [facility] literature at bedside. Made Inpatient Case Manager, Gavin PoundDeborah aware that [facility] Care Management following. Of note, [facility] Care [facility] services does not replace or interfere with any services that are arranged by inpatient case management or social work.  For additional questions or referrals please contact:   Charlesetta ShanksVictoria Dezi Schaner, RN BSN CCM [facility] Liaison  [phone] business mobile phone

## 2015-02-03 NOTE — Care Management Important Message (Signed)
Important Message  Patient Details  Name: Mitchell [name] MRN: [id number] Date of Birth: [birth year]   Medicare Important Message Given:  Yes-second notification given    Kyla BalzarineShealy, Jaycie Kregel Abena 02/03/2015, 1:42 PM

## 2015-02-03 NOTE — Progress Notes (Signed)
Walking O2 sat re-done. SATURATION QUALIFICATIONS: (This note is used to comply with regulatory documentation for home oxygen)  Patient Saturations on Room Air at Rest = 94%  Patient Saturations on Room Air while Ambulating = 87%  Patient Saturations on 2 Liters of oxygen while Ambulating = 95%

## 2015-02-03 NOTE — Care Management Note (Addendum)
Case Management Note  Patient Details  Name: Mitchell [name] MRN: [id number] Date of Birth: [birth year]  Subjective/Objective:      Patient for dc today, [facility], and neb machine.  Ref[facility] made [facility], Pt, OT, aide .  Soc will begin 24-48 hrs post dc.  Also [facility], which will be brought up to patient's room.  Patient states he does not need a neb machine he has one at home.  Patient will need home oxygen.  This referral also given to [facility].                Action/Plan:   Expected Discharge Date:                  Expected Discharge Plan:  Home w Home Health Services  In-House Referral:     Discharge planning Services  CM Consult  Post Acute Care Choice:  Home Health Choice offered to:  Patient  DME Arranged:  Nebulizer machine DME Agency:  [facility]  HH Arranged:  RN, PT, OT, Nurse's Aide HH Agency:  [facility]  Status of Service:  Completed, signed off  Medicare Important Message Given:    Date Medicare IM Given:    Medicare IM give by:    Date Additional Medicare IM Given:    Additional Medicare Important Message give by:     If discussed at Long Length of Stay Meetings, dates discussed:    Additional Comments:  Leone Havenaylor, Srinivas Lippman Clinton, RN 02/03/2015, 11:15 AM

## 2015-02-03 NOTE — Discharge Summary (Signed)
Physician Discharge Summary   Patient ID: Mitchell [name] MRN: [id number] DOB/AGE: 1941-03-21 74 y.o.  Admit date: 01/31/2015 Discharge date: 02/03/2015  Primary Care Physician:  Donovan Kail, MD  Discharge Diagnoses:    . COPD exacerbation (HCC) . CAD (coronary artery disease) . Hyperlipemia . Hypertension . Dyspnea  obesity  Consults:   Cardiology, Dr. Tresa Endo   Recommendations for Outpatient Follow-up:  Patient was recommended albuterol nebs 3 times a day and in between as needed, prednisone with taper, Levaquin  Home health services arranged by the case manager  Outpatient pulmonology follow-up appointment scheduled on 02/07/15 with Dr. Vassie Loll  TESTS THAT NEED FOLLOW-UP None  DIET:Heart healthy diet   Allergies:   Allergies  Allergen Reactions  . Lipitor [Atorvastatin] Other (See Comments)    "just paralyzed me; I can't take it"  . Statins Other (See Comments)    "paralyzed me; I can't take it"     Discharge Medications:   Medication List    STOP taking these medications        diclofenac sodium 1 % Gel  Commonly known as:  VOLTAREN     Fluticasone-Salmeterol 250-50 MCG/DOSE Aepb  Commonly known as:  ADVAIR      TAKE these medications        albuterol (2.5 MG/3ML) 0.083% nebulizer solution  Commonly known as:  PROVENTIL  Take 3 mLs (2.5 mg total) by nebulization 3 (three) times daily.     albuterol 108 (90 BASE) MCG/ACT inhaler  Commonly known as:  PROVENTIL HFA;VENTOLIN HFA  Inhale 2 puffs into the lungs every 6 (six) hours as needed for wheezing. For shortness of breath     aspirin 81 MG EC tablet  Take 1 tablet (81 mg total) by mouth daily.     atenolol 25 MG tablet  Commonly known as:  TENORMIN  Take 25 mg by mouth every morning.     benzonatate 100 MG capsule  Commonly known as:  TESSALON  Take 1 capsule (100 mg total) by mouth 3 (three) times daily as needed for cough.     budesonide-formoterol 160-4.5 MCG/ACT inhaler  Commonly  known as:  SYMBICORT  Inhale 2 puffs into the lungs 2 (two) times daily.     clopidogrel 75 MG tablet  Commonly known as:  PLAVIX  Take 1 tablet (75 mg total) by mouth daily with breakfast.     furosemide 20 MG tablet  Commonly known as:  LASIX  Take 1 tablet (20 mg total) by mouth daily. Fluid pill     guaiFENesin 600 MG 12 hr tablet  Commonly known as:  MUCINEX  Take 2 tablets (1,200 mg total) by mouth 2 (two) times daily.     levofloxacin 750 MG tablet  Commonly known as:  LEVAQUIN  Take 1 tablet (750 mg total) by mouth daily. X 7days     mometasone-formoterol 100-5 MCG/ACT Aero  Commonly known as:  DULERA  Inhale 2 puffs into the lungs 2 (two) times daily.     nitroGLYCERIN 0.4 MG SL tablet  Commonly known as:  NITROSTAT  Place 0.4 mg under the tongue every 5 (five) minutes as needed. FOR CHEST PAIN     predniSONE 10 MG tablet  Commonly known as:  DELTASONE  Prednisone dosing: Take  Prednisone  (4 tabs) x 3 days, then taper to  (3 tabs) x 3 days, then  (2 tabs) x 3days, then  (1 tab) x 3days, then OFF.  Dispense:  30 tabs, refills: None  Start taking on:  02/04/2015     tamsulosin 0.4 MG Caps capsule  Commonly known as:  FLOMAX  Take 0.4 mg by mouth daily after breakfast.         Brief H and P: For complete details please refer to admission H and P, but in brief Per Dr. Katrinka BlazingSmith on 01/31/15 Patient is a 74 year old Caucasian male with past medical history significant for COPD not requiring home oxygen, coronary artery disease, myocardial infarction, worst being legally blind, alcohol use, and obesity; who presents with a 2 day history of progressively worsening shortness of breath. Patient reported associated symptoms of a duct of cough with greenish sputum production, subjective fever, diaphoresis, and chest pain. Currently the patient feels as though he has a headache He reports that he tried taking at least 5 nebulized treatments at home without relief of  symptoms. He notes no significant relief with the treatments. He reports living at home alone legally blind. Feels like he needs assistance at home and is not able to get it. Family which includes 3 sons live nearby.  His cardiologist Verdis PrimeHenry Smith, MD reports last seeing him in April or May of this year. He reports that he took one full pill yesterday and that is instructed to take 1-2 depending on his weight. Fluid fills can make him lose up to 10 pounds as he states his dry weight is somewhere around 226. On presentation to the ED his weight is 234. BMPs were within normal limits. Lastly the patient complains of right foot pain and denies any recent trauma. Pain is experienced mostly with bearing weight.   Hospital Course:   Dyspnea secondary to Acute COPD exacerbation (HCC) Patient was placed on scheduled bronchodilators, Pulmicort, IV Levaquin, flutter valve. He was placed on IV Solu-Medrol, transitioned to oral prednisone with taper. - Influenza panel negative - Home O2 evaluation will be done prior to discharge. - Patient had been taking Lasix at home, sometimes taking double dose with no significant improvement. 2-D echo was done which showed EF of 55-60%, normal wall motion, no regional wall motion abnormalities, diastolic function parameters normal. Patient was seen by cardiology, did not feel that dyspnea is coming from any underlying congestive heart failure. BNP 40 which exclude CHF exacerbation.  - No hypoxia or tachycardia to suggest any pulmonary embolism. Outpatient pulmonology appointment was scheduled with Dr. Vassie LollAlva on 10/24, I recommended patient to follow-up with his cardiologist in 2 weeks, Dr. Katrinka BlazingSmith.    Hypertension: Controlled at this time Continue home meds atenolol  CAD (coronary artery disease) with history of PCI and 2 previous MIs. -Continue Plavix and aspirin. No acute issues per cardiology.  Hyperlipemia: - Allergic to Lipitor and statins  Vision  impairment:chronic issue - PT evaluation recommended home health physical therapy. Patient [facility] also [facility] and for home assessments and disease process education. Home health RN for medication management and home health PT was arranged. - home O2 evaluation prior to discharge   Obesity -Patient counseled strongly on diet and weight control   Alcohol use (HCC) - Patient reports occasional alcohol use  Sinus tachycardia (HCC); Likely due to #1    Day of Discharge BP 143/84 mmHg  Pulse 81  Temp(Src) 98.1 F (36.7 C) (Oral)  Resp 18  Ht 6' (1.829 m)  Wt 105.779 kg (233 lb 3.2 oz)  BMI 31.62 kg/m2  SpO2 92%  Physical Exam: General: Alert and awake oriented x3 not in any acute distress. HEENT: anicteric sclera,  pupils reactive to light and accommodation CVS: S1-S2 clear no murmur rubs or gallops Chest: clear to auscultation bilaterally, no wheezing rales or rhonchi Abdomen: soft nontender, nondistended, normal bowel sounds Extremities: no cyanosis, clubbing or edema noted bilaterally Neuro: Cranial nerves II-XII intact, no focal neurological deficits   The results of significant diagnostics from this hospitalization (including imaging, microbiology, ancillary and laboratory) are listed below for reference.    LAB RESULTS: Basic Metabolic Panel:  Recent Labs Lab 02/02/15 0531 02/03/15 0541  NA 138 137  K 4.5 4.1  CL 100* 102  CO2 25 27  GLUCOSE 181* 158*  BUN 23* 24*  CREATININE 1.32* 1.21  CALCIUM 9.1 8.7*   Liver Function Tests:  Recent Labs Lab 01/31/15 1442 02/01/15 0521  AST 25 18  ALT 19 17  ALKPHOS 53 57  BILITOT 0.9 0.9  PROT 5.7* 5.3*  ALBUMIN 3.2* 2.9*   No results for input(s): LIPASE, AMYLASE in the last 168 hours. No results for input(s): AMMONIA in the last 168 hours. CBC:  Recent Labs Lab 01/31/15 0405  02/02/15 0531 02/03/15 0541  WBC 16.3*  < > 13.4* 12.4*  NEUTROABS 13.4*  --   --   --   HGB 15.1  < > 14.0 14.8   HCT 46.0  < > 43.5 44.9  MCV 89.8  < > 89.1 89.4  PLT 146*  < > 154 158  < > = values in this interval not displayed. Cardiac Enzymes:  Recent Labs Lab 01/31/15 0405  TROPONINI <0.03   BNP: Invalid input(s): POCBNP CBG:  Recent Labs Lab 02/02/15 2105 02/03/15 0813  GLUCAP 226* 149*    Significant Diagnostic Studies:  Dg Chest 2 View  02/01/2015  CLINICAL DATA:  Short of breath EXAM: CHEST  2 VIEW COMPARISON:  Yesterday FINDINGS: Upper normal heart size. Left basilar atelectasis. Low volumes. No pneumothorax. IMPRESSION: Low volumes and left basilar atelectasis persist. Electronically Signed   By: Jolaine Click M.D.   On: 02/01/2015 09:31   Dg Chest Portable 1 View  01/31/2015  CLINICAL DATA:  Shortness of breath, history of COPD and CHF. EXAM: PORTABLE CHEST 1 VIEW COMPARISON:  Radiograph 09/16/2012.  CT 01/22/2012 FINDINGS: Hyperinflation is unchanged from prior exam. Both costophrenic angles are excluded from the field of view. Heart at the upper limits normal in size, mediastinal contours are normal. No airspace consolidation, pulmonary edema, or pneumothorax. No large pleural effusion. Sclerotic densities projecting over the left upper hemithorax correspond to bone islands in the left posterior rib on prior CT. No acute osseous abnormalities are seen. IMPRESSION: Hyperinflation, unchanged from prior exam. No acute pulmonary process, allowing for both costophrenic angles being excluded. Electronically Signed   By: Rubye Oaks M.D.   On: 01/31/2015 03:44    2D ECHO: Study Conclusions  - Left ventricle: The cavity size was normal. Wall thickness was normal. Systolic function was normal. The estimated ejection fraction was in the range of 55% to 60%. Wall motion was normal; there were no regional wall motion abnormalities. Left ventricular diastolic function parameters were normal.   Disposition and Follow-up: Discharge Instructions    Diet - low sodium heart  healthy    Complete by:  As directed      Discharge instructions    Complete by:  As directed   Please continue albuterol nebs 3 times a day and in between as needed. You can use albuterol inhaler as a rescue inhaler.  Please follow with Dr. Vassie Loll, pulmonology, on  02/07/15, appointment scheduled.     Increase activity slowly    Complete by:  As directed             DISPOSITION: Home with home health   DISCHARGE FOLLOW-UP Follow-up Information    Follow up with Oretha Milch., MD On 02/07/2015.   Specialty:  Pulmonary Disease   Why:  At 10:00 AM, for hospital follow-up, please arrive at 9:45am for paperwork    Contact information:   [address] [place] [address] [phone]       Follow up with ROSS,ALLeN, MD. Schedule an appointment as soon as possible for a visit in 2 weeks.   Specialty:  Obstetrics and Gynecology   Why:  for hospital follow-up// This is not there patient   Contact information:   [address] [place] [address] [phone]       Follow up with Lesleigh Noe, MD. Schedule an appointment as soon as possible for a visit on 02/17/2015.   Specialty:  Cardiology   Why:  for hospital follow-up, please ask about lasix (fluid pill)  Appointment with Dr. Katrinka Blazing PA  02/17/15 at 3:20pm   Contact information:   [address] [place] [address] [phone]       Follow up with [facility].   Why:  HHRN, HHPT, OT, aide   Contact information:   [address] [place] [address] [phone]       Follow up with [facility].   Why:  neb machine   Contact information:   [address] [place] [address] [phone]        Time spent on Discharge: 35 minutes  Signed:   RAI,RIPUDEEP M.D. [facility] 02/03/2015, 11:27 AM Pager: [phone]

## 2015-02-03 NOTE — Progress Notes (Signed)
PT Cancellation Note  Patient Details Name: Mitchell [name] MRN: [id number] DOB: 1940/11/22   Cancelled Treatment:    Reason Eval/Treat Not Completed: Other (comment) (Pt preparing for d/c).  Michail JewelsAshley Parr PT, DPT [phone] Pager: [phone] 02/03/2015, 12:03 PM

## 2015-02-04 ENCOUNTER — Telehealth: Payer: Self-pay | Admitting: Interventional Cardiology

## 2015-02-04 NOTE — Telephone Encounter (Signed)
Referred Home Health Nurse to call patient's PCP.

## 2015-02-04 NOTE — Patient Outreach (Signed)
[facility]) Care Management  02/04/2015  Mitchell [name] 12-Dec-1940 [id number]   Referral from Charlesetta ShanksVictoria Brewer, RN to assign Community RN, assigned Elliot CousinLisa Matthews, RN.  Thanks, Corrie MckusickLisa O. Sharlee BlewMoore, [facility] Care Management [facility] CM Assistant Phone: [phone] Fax: [phone]

## 2015-02-04 NOTE — Telephone Encounter (Signed)
New message    Home health nurse calling - new  prescription  [facility] to [facility] - home oxygen mask.    Fax# [phone].

## 2015-02-07 ENCOUNTER — Encounter: Payer: Self-pay | Admitting: Pulmonary Disease

## 2015-02-07 ENCOUNTER — Telehealth: Payer: Self-pay | Admitting: Pulmonary Disease

## 2015-02-07 ENCOUNTER — Ambulatory Visit (INDEPENDENT_AMBULATORY_CARE_PROVIDER_SITE_OTHER): Payer: Commercial Managed Care - HMO | Admitting: Pulmonary Disease

## 2015-02-07 VITALS — BP 118/78 | HR 64

## 2015-02-07 DIAGNOSIS — J449 Chronic obstructive pulmonary disease, unspecified: Secondary | ICD-10-CM | POA: Diagnosis not present

## 2015-02-07 DIAGNOSIS — J441 Chronic obstructive pulmonary disease with (acute) exacerbation: Secondary | ICD-10-CM | POA: Diagnosis not present

## 2015-02-07 DIAGNOSIS — R0602 Shortness of breath: Secondary | ICD-10-CM

## 2015-02-07 MED ORDER — NYSTATIN 100000 UNIT/ML MT SUSP: 5.0000 mL | Freq: Two times a day (BID) | OROMUCOSAL | Status: DC

## 2015-02-07 NOTE — Telephone Encounter (Signed)
OK to continue dulera twice daily- rinse mouth after use Rx for albuterol MDI 2 puffs as needed for wheezing every 6h

## 2015-02-07 NOTE — Assessment & Plan Note (Signed)
Complete course of prednisone and Levaquin Stay on dulera 2 puffs twice daily Use albuterol MDI 2 puffs as needed every 6h Consider pulmonary rehabilitation in the future if he would be interested

## 2015-02-07 NOTE — Patient Instructions (Addendum)
Breathing test showed airway obstruction & lung capacity at 38% Complete course of prednisone Stay on dulera 2 puffs twice daily Use albuterol MDI 2 puffs as needed every 6h Nystatin 5 ml swish & swallow twice daily x 10 days

## 2015-02-07 NOTE — Progress Notes (Signed)
Subjective:    Patient ID: Mitchell [name], male    DOB: [birth year], 74 y.o.   MRN: [id number]  HPI  Mitchell [name] is a 74 year old Caucasian male who presents for evaluation of COPD PMH coronary artery disease, myocardial infarction, worst being legally blind, alcohol use, and obesity;  He smoked a pipe for 30 years, never smoked any cigarettes.  He is accompanied by his ex-wife 30 years, [name].  Adm 10/17-10/20 with a 2 day history of progressively worsening shortness of breath. Patient was seen by cardiology, did not feel that dyspnea is coming from any underlying congestive heart failure. BNP 40 . He was discharged on Levaquin and prednisone taper. He feels that DuoNeb's worsened his symptoms and does not want to take this. He is unclear on his medications- but I had his ex-wife check and call us-he does seem that he is taking Dulera Frequent wheezing has been noted by friends and family members He reports a dry cough without diurnal or seasonal variation,On tessalon for cough - helps  He denies pedal edema or history of orthopnea or paroxysmal nocturnal dyspnea He is a retired [employer] architect, drinks a lot on weekends  Significant tests/ events  Spirometry-showed moderate airway obstruction with FEV1 39%  echo was done which showed EF of 55-60%, normal wall motion, no regional wall motion abnormalities, diastolic function parameters normal   Past Medical History  Diagnosis Date  . Hypertension   . Hyperlipidemia   . Depression   . BPH (benign prostatic hypertrophy)   . Anxiety   . GERD (gastroesophageal reflux disease)   . Coronary artery disease   . Myocardial infarction (HCC) 01/22/2012  . Anginal pain (HCC) 01/2012  . COPD (chronic obstructive pulmonary disease) (HCC)     "maybe" (02/14/2012)  . Shortness of breath     "walking and laying down sometimes" (02/14/2012)    Past Surgical History  Procedure Laterality Date  . Ganglion cyst removed  1956?    right  .  Coronary angioplasty with stent placement  05/11/2005    PCI OF LAD  . Cataract extraction w/ intraocular lens  implant, bilateral  ~ 2010  . Refractive surgery  2010    left  . Left heart catheterization with coronary angiogram Bilateral 01/24/2012    Procedure: LEFT HEART CATHETERIZATION WITH CORONARY ANGIOGRAM;  Surgeon: Lesleigh Noe, MD;  Location: [facility] CATH LAB;  Service: Cardiovascular;  Laterality: Bilateral;    Allergies  Allergen Reactions  . Lipitor [Atorvastatin] Other (See Comments)    "just paralyzed me; I can't take it"  . Statins Other (See Comments)    "paralyzed me; I can't take it"    Social History   Social History  . Marital Status: Single    Spouse Name: N/A  . Number of Children: N/A  . Years of Education: N/A   Occupational History  . Not on file.   Social History Main Topics  . Smoking status: Former Smoker -- 42 years    Types: Pipe    Quit date: 04/28/2011  . Smokeless tobacco: Never Used  . Alcohol Use: 3.5 oz/week    7 Standard drinks or equivalent per week     Comment: 02/14/2012 "weekends I have 6-7 mixed drinks"     occasional  . Drug Use: No  . Sexual Activity: Yes   Other Topics Concern  . Not on file   Social History Narrative   Truck driver  Family History  Problem Relation Age of Onset  . Heart failure Mother   . Heart failure Brother      Review of Systems neg for any significant sore throat, dysphagia, itching, sneezing, nasal congestion or excess/ purulent secretions, fever, chills, sweats, unintended wt loss, pleuritic or exertional cp, hempoptysis, orthopnea pnd or change in chronic leg swelling. Also denies presyncope, palpitations, heartburn, abdominal pain, nausea, vomiting, diarrhea or change in bowel or urinary habits, dysuria,hematuria, rash, arthralgias, visual complaints, headache, numbness weakness or ataxia.     Objective:   Physical Exam  Gen. Pleasant, obese, in no distress, normal affect ENT  - no lesions, no post nasal drip, class 2 airway, thrush + Neck: No JVD, no thyromegaly, no carotid bruits Lungs: no use of accessory muscles, no dullness to percussion, decreased without rales or rhonchi  Cardiovascular: Rhythm regular, heart sounds  normal, no murmurs or gallops, no peripheral edema Abdomen: soft and non-tender, no hepatosplenomegaly, BS normal. Musculoskeletal: No deformities, no cyanosis or clubbing Neuro:  alert, non focal, no tremors       Assessment & Plan:

## 2015-02-07 NOTE — Telephone Encounter (Signed)
Spoke w/ pt spouse. She reports they were letting RA know pt is on dulera 100 mcg and on O2. Pt does not have symbicort. Will make RA aware

## 2015-02-07 NOTE — Assessment & Plan Note (Signed)
It is curious that he is a pipe smoker alone but does have moderate to severe airway obstruction with FEV1 of 39%. He is possible that this is solely because he is recovering from an exacerbation. We'll perform spirometry pre-and post on a future visit.  He'll continue Dulera in the meantime Due to thrush, I have also given him a prescription for nystatin hopefully he will recover the taste for  food

## 2015-02-08 MED ORDER — MOMETASONE FURO-FORMOTEROL FUM 100-5 MCG/ACT IN AERO: 2.0000 | INHALATION_SPRAY | Freq: Two times a day (BID) | RESPIRATORY_TRACT | Status: DC

## 2015-02-08 NOTE — Telephone Encounter (Signed)
Patient states that he already has albuterol inhaler and does not need refill at this time.  Just needs refills of Dulera.  [facility] per patient's request.  Patient notified.  Nothing further needed.

## 2015-02-09 ENCOUNTER — Other Ambulatory Visit: Payer: Self-pay | Admitting: *Deleted

## 2015-02-09 NOTE — Patient Outreach (Signed)
[facility]) Care Management  02/09/2015  Mitchell [name] [birth year] [id number]   Initial transition of care  RN attempted to reach this pt for [facility] services however unsuccessful. RN able to leave a  HIPAA approved voice message requesting a call back. Will inquire further on the return cal if not will continue outreach calls accordingly.  Elliot CousinLisa Zein Helbing, RN Care Management Coordinator [facility] Main Office [phone]

## 2015-02-10 ENCOUNTER — Telehealth: Payer: Self-pay | Admitting: Pulmonary Disease

## 2015-02-10 ENCOUNTER — Other Ambulatory Visit: Payer: Self-pay

## 2015-02-10 ENCOUNTER — Other Ambulatory Visit: Payer: Self-pay | Admitting: *Deleted

## 2015-02-10 DIAGNOSIS — J441 Chronic obstructive pulmonary disease with (acute) exacerbation: Secondary | ICD-10-CM

## 2015-02-10 NOTE — Patient Outreach (Signed)
Subjective: [name] is a 74 year old male who was referred to pharmacy due to not being able to afford his nystatin suspension.  I called [name] and he stated when he went to the pharmacy, they told him it would be $275.63.  I called [facility] and determined the cost was [facility] not the Nystatin suspension.  When I looked in EPIC, it looked like the medication was sent to [facility].  [facility] is now [facility] [place].  I called them with [name] on the line and determined that Nystatin was not sent to them.  I told [name], I would call Dr. Reginia NaasAlva's office and get the medication sent [facility].  He stated this would be fine.   Objective: Outpatient Encounter Prescriptions as of 02/10/2015  Medication Sig Note  . albuterol (PROVENTIL HFA;VENTOLIN HFA) 108 (90 BASE) MCG/ACT inhaler Inhale 2 puffs into the lungs every 6 (six) hours as needed for wheezing. For shortness of breath   . albuterol (PROVENTIL) (2.5 MG/3ML) 0.083% nebulizer solution Take 3 mLs (2.5 mg total) by nebulization 3 (three) times daily.   Marland Kitchen. aspirin 325 MG tablet Take 1 tablet by mouth daily. AS NEEDED 02/07/2015: Received from: External Pharmacy Received Sig:   . aspirin EC 81 MG EC tablet Take 1 tablet (81 mg total) by mouth daily.   Marland Kitchen. atenolol (TENORMIN) 25 MG tablet Take 25 mg by mouth every morning.    . benzonatate (TESSALON) 100 MG capsule Take 1 capsule (100 mg total) by mouth 3 (three) times daily as needed for cough.   . budesonide-formoterol (SYMBICORT) 160-4.5 MCG/ACT inhaler Inhale 2 puffs into the lungs 2 (two) times daily.   . clopidogrel (PLAVIX) 75 MG tablet Take 1 tablet (75 mg total) by mouth daily with breakfast.   . furosemide (LASIX) 20 MG tablet Take 1 tablet (20 mg total) by mouth daily. Fluid pill   . guaiFENesin (MUCINEX) 600 MG 12 hr tablet Take 2 tablets (1,200 mg total) by mouth 2 (two) times daily.   Marland Kitchen. levofloxacin (LEVAQUIN) 750 MG tablet Take 1 tablet  (750 mg total) by mouth daily. X 7days   . mometasone-formoterol (DULERA) 100-5 MCG/ACT AERO Inhale 2 puffs into the lungs 2 (two) times daily.   . nitroGLYCERIN (NITROSTAT) 0.4 MG SL tablet Place 0.4 mg under the tongue every 5 (five) minutes as needed. FOR CHEST PAIN   . nystatin (MYCOSTATIN) 100000 UNIT/ML suspension Take 5 mLs (500,000 Units total) by mouth 2 (two) times daily. Swish & Swallow.   . predniSONE (STERAPRED UNI-PAK 21 TAB) 10 MG (21) TBPK tablet As directed 02/07/2015: Received from: External Pharmacy Received Sig: TK UTD ON PACKAGE  . tamsulosin (FLOMAX) 0.4 MG CAPS capsule Take 0.4 mg by mouth daily after breakfast.     No facility-administered encounter medications on file as of 02/10/2015.   Assessment: 1.  Medication Adherence:  [name] cannot be adherent to his nystatin due to not being able to afford it if it is as expensive as he thought.  2.  Nystatin prescription:  Nystatin was not sent to the correct pharmacy in order for [name] to use it right away.   Plan:   1.  I will call Dr. Reginia NaasAlva's office and see if they can send a new [facility].  2.  I will cover the cost of the medication if [name] is not able to afford it.  3.  I will follow up with Mr. Caul once I hear from Dr. Reginia Naas office about the prescription.   4.  I will notify Mitchell Cousin, RN of my findings.   Mitchell [name], PharmD, [employer] [facility] [employer] consultant [phone]

## 2015-02-10 NOTE — Patient Outreach (Signed)
I called [name] to discuss assistance with a medication.  I had to leave a HIPPA compliant message for him to call me back.  I will reach out later if I do not hear from him.  Steve Rattlerawn Aswad Wandrey, PharmD, [employer] [facility] Pharmacy Manager [phone]

## 2015-02-10 NOTE — Patient Outreach (Addendum)
[facility]) Care Management  02/10/2015  Mitchell [name] 1940-05-14 [id number]   Transition of care (2nd outreach)  RN able to speak with pt and completed the transition of care. RN reintroduced the [facility] program and available services to assist pt with his COPD through education and awareness on when to interact with medical care. Pt very receptive and states he is currently having issues with the involved [facility] agency. RN explained the difference between the [facility] and case management services with [facility] which do not conflict. Pt aware that [facility] visits on a month bases however maybe able to visit twice monthly if needed for adherence to the plan of care that will be developed and initiated on the initial home visit. Further discussed COPD action plan concerning the GREEN/YELLOW/RED zone and what to do if acute symptoms are encountered. Pt again receptive and express his gratitude for services for the details. Pt also reports his recent follow up appointments with his cardiologist Dr. Verdis PrimeHenry [name] and [facility] where he was found to have "blisters" in his mouth and prescribed a medicine that he can not afford. Therefore pt has not been taking this medication that he could not recall the name and did not have the information on this medication on hand during the call but would be able to obtain from his spouse at another time. Pt states he has loss 12 lbs since discharge due to th inability to consume foods due because of the blisters. RN has offered to assist pt not only with community home visits based upon his available but to also refer to [facility](Mitchell [name]) for assistance with his medication issues. Stress the importance of taking all her prescribed medications as a prevention measure.  Pt again receptive and RN will again refer to pharmacy via [facility] and based upon pt's schedule unable to meet this week will schedule next home visit and another  transition of care call accordingly. Due to pt's above issues plan of care discussed along with goals as pt receptive and has a clear understanding of all discussed today.Will continue to follow up on pt's progress related to plan of care. Pt again appreciative and grateful for today's call and services that have been offered.   Elliot CousinLisa Matthews, RN Care Management Coordinator [facility] Main Office [phone]

## 2015-02-10 NOTE — Patient Outreach (Signed)
[facility]) Care Management  02/10/2015  Mitchell [name] 02/07/1941 [id number]   Request from Elliot CousinLisa Matthews, RN to assign Pharmacy, assigned Steve Rattlerawn Pettus, PharmD.  Thanks, Corrie MckusickLisa O. Sharlee BlewMoore, [name] Phone: [phone] Fax: [phone]

## 2015-02-10 NOTE — Patient Outreach (Signed)
I called [facility] to discuss getting a new prescription for nystatin suspension sent to [facility].  The triage nurse took my message and was going to send it to Dr. Reginia NaasAlva's nurse.  I will follow up to determine if the prescription was sent if I do not hear back from them.   Steve Rattlerawn Annete Ayuso, PharmD, [employer] [facility] Pharmacy Manager [phone]

## 2015-02-11 ENCOUNTER — Other Ambulatory Visit: Payer: Self-pay

## 2015-02-11 MED ORDER — NYSTATIN 100000 UNIT/ML MT SUSP: 5.0000 mL | Freq: Two times a day (BID) | OROMUCOSAL | Status: AC

## 2015-02-11 NOTE — Patient Outreach (Signed)
I was able to determine that  Pulmonology called the [facility].  I called [name] to let him know it was there and gave the address to the pharmacy.  He stated he would pick it up later today.  I told him to call me with further issues or questions.  I will close his case to pharmacy.  I will let Elliot CousinLisa Matthews, RN know as well.   Steve Rattlerawn Nivia Gervase, PharmD, [employer] [facility] Pharmacy Manager [phone]

## 2015-02-11 NOTE — Telephone Encounter (Signed)
[facility] [place] [facility]. If [facility] then the pt would have $0 copay. Called and spoke to pt. Pt states [facility] and he said it would cost him over $200. [facility]. Pt [facility]. Nothing further needed at this time.

## 2015-02-11 NOTE — Telephone Encounter (Signed)
Mitchell [name], [facility] called and states patient needs to get meds locally for Nystatin orally. He can be reached at [phone].

## 2015-02-14 ENCOUNTER — Other Ambulatory Visit: Payer: Self-pay | Admitting: *Deleted

## 2015-02-14 NOTE — Patient Outreach (Signed)
[facility]) Care Management  02/14/2015  Allie BossierJarald Dean Florido 02-27-1941 [id number]   Transition of Care (2nd week)  RN spoke with pt today as an ongoing transition of care follow up from last week to inquire if he continues to do well. Pt states he has gained a little fluid and may need this RN to assist him tomorrow on the initial home visit with what to do. RN inquired if he needs to contact his provided concerning the fluid retention and if he has any heart condition such as HF that contribute to his fluid retention. Pt confirmed no heart condition and refused to consult his provider indicating "it's not that bad". RN has informed pt to contact his provider if his condition continues to get worse especially if it effects his breathing. Pt reports his weight at 231 lbs today. No other symptoms or signs of distress mentioned at this time as pt verified tomorrow's appointment at 9 AM. RN will completed a home visit and notify pt's primary of [facility] involvement.   Elliot CousinLisa Lekeya Rollings, RN Care Management Coordinator [facility] Main Office [phone]

## 2015-02-15 ENCOUNTER — Encounter: Payer: Self-pay | Admitting: *Deleted

## 2015-02-15 ENCOUNTER — Other Ambulatory Visit: Payer: Self-pay | Admitting: *Deleted

## 2015-02-15 NOTE — Patient Outreach (Signed)
[facility]N) Care Management   02/15/2015  Mitchell [name] May 11, 1940 [id number]  Mitchell [name] is an 74 y.o. male  Subjective:  Pt receptive to enrolling into the [name] program (consent obtained already). COPD:  Pt reports he is not aware of the COPD action plan and continues to breath well on the 3 liters continuous O2. Pt report he is willing to monitor his COPD better  with the information that will be provided today. Pt states he wants to stay out of the hospital and willing to do what is recommended for his to maintain his health. Denies any breathing problems however has SOB with any activity. Uses his home O2 and portable tanks when needed. Inquired about a long distance trip and if he is well enough to travel using his oxygen. Pt reports his last flare up with his COPD follow by hospitalization pt receptive to information on what to do before his symptoms get worse.  MEDICATION:  Pt reports he has not taken his new medication Nystatin prescribed post-discharged. Pt reports [name] pharmacy assisted with getting the medication however pt states he does not know how to measure and take the medication using the syringe.  States he will take his medication as prescribed once directed on how to draw up the correct medications for dosing. States he uses his inhaler as prescribed but has not been rinsing his mouth out and wonders if this contributes to his blisters in his mouth.  MEDICAL APPOINTMENT: Pt report has not been able to attend all medical appointments due to the co-pays. Pt states he tries to attend to all medical appointments if he has his co-pays. Pt has reported his income of around $1500 monthly and aware he may not be able to qualify for resources offered in the community but able to have some assistance throughout the week that can assist with his errands ect.. RESOURCE: Pt has requested RN assist with contacting [facility] and consult with his agent  Mitchell [name] [phone] concerning request made in the past however no response based upon when he calls. Pt requesting the following: short cooking gloves, sunglasses and a new watch or repair due to the current watch is reading out the wrong date (talking watch). SUPPORTIVE CARE: Pt has supportive family with 3 sons, ex-wife [name]([name]) and a friend that comes back several days a week to assist pt with his errands and outings as needed. Pt has indicated he has a A.D but no HCPOA.   Objective:   Review of Systems  HENT: Negative.   Eyes: Negative.   Respiratory: Positive for cough.        Clear sputum  Cardiovascular: Negative.   Gastrointestinal: Negative.   Genitourinary: Negative.   Musculoskeletal: Negative.   Skin: Negative.   Neurological: Negative.   Endo/Heme/Allergies: Negative.   Psychiatric/Behavioral: Negative.     Physical Exam  Constitutional: He is oriented to person, place, and time. He appears well-developed and well-nourished.  HENT:  Right Ear: External ear normal.  Left Ear: External ear normal.  Neck: Normal range of motion.  Cardiovascular: Regular rhythm.   Respiratory: Effort normal and breath sounds normal.  GI: Soft. Bowel sounds are normal.  Musculoskeletal: He exhibits edema.  Left lower ankle with minimal swelling 1+  Neurological: He is alert and oriented to person, place, and time.  Skin: Skin is warm and dry.  Psychiatric: He has a normal mood and affect. His behavior is normal. Judgment and thought content normal.  Current Medications:   Current Outpatient Prescriptions  Medication Sig Dispense Refill  . aspirin 325 MG tablet Take 1 tablet by mouth daily. AS NEEDED    . aspirin EC 81 MG EC tablet Take 1 tablet (81 mg total) by mouth daily.    Marland Kitchen atenolol (TENORMIN) 25 MG tablet Take 25 mg by mouth every morning.     . benzonatate (TESSALON) 100 MG capsule Take 1 capsule (100 mg total) by mouth 3 (three) times daily as needed for cough. 60 capsule  0  . clopidogrel (PLAVIX) 75 MG tablet Take 1 tablet (75 mg total) by mouth daily with breakfast. 30 tablet 11  . furosemide (LASIX) 20 MG tablet Take 1 tablet (20 mg total) by mouth daily. Fluid pill 30 tablet 3  . guaiFENesin (MUCINEX) 600 MG 12 hr tablet Take 2 tablets (1,200 mg total) by mouth 2 (two) times daily. 30 tablet 0  . levofloxacin (LEVAQUIN) 750 MG tablet Take 1 tablet (750 mg total) by mouth daily. X 7days 7 tablet 0  . mometasone-formoterol (DULERA) 100-5 MCG/ACT AERO Inhale 2 puffs into the lungs 2 (two) times daily. 1 Inhaler 6  . nitroGLYCERIN (NITROSTAT) 0.4 MG SL tablet Place 0.4 mg under the tongue every 5 (five) minutes as needed. FOR CHEST PAIN    . nystatin (MYCOSTATIN) 100000 UNIT/ML suspension Take 5 mLs (500,000 Units total) by mouth 2 (two) times daily. Swish & Swallow. 100 mL 0  . predniSONE (STERAPRED UNI-PAK 21 TAB) 10 MG (21) TBPK tablet As directed  0  . tamsulosin (FLOMAX) 0.4 MG CAPS capsule Take 0.4 mg by mouth daily after breakfast.     . albuterol (PROVENTIL HFA;VENTOLIN HFA) 108 (90 BASE) MCG/ACT inhaler Inhale 2 puffs into the lungs every 6 (six) hours as needed for wheezing. For shortness of breath    . albuterol (PROVENTIL) (2.5 MG/3ML) 0.083% nebulizer solution Take 3 mLs (2.5 mg total) by nebulization 3 (three) times daily. (Patient not taking: Reported on 02/15/2015) 75 mL 0  . budesonide-formoterol (SYMBICORT) 160-4.5 MCG/ACT inhaler Inhale 2 puffs into the lungs 2 (two) times daily.     No current facility-administered medications for this visit.    Functional Status:   In your present state of health, do you have any difficulty performing the following activities: 02/15/2015 01/31/2015  Hearing? N N  Vision? N N  Difficulty concentrating or making decisions? N N  Walking or climbing stairs? N N  Dressing or bathing? N N  Doing errands, shopping? N N  Preparing Food and eating ? N -  Using the Toilet? N -  In the past six months, have you  accidently leaked urine? N -  Do you have problems with loss of bowel control? N -  Managing your Medications? N -  Managing your Finances? N -  Housekeeping or managing your Housekeeping? N -    Fall/Depression Screening:    PHQ 2/9 Scores 02/15/2015  PHQ - 2 Score 0   Filed Vitals:   02/15/15 0943  BP: 120/78  Pulse: 61  Resp: 20    Assessment:   Enrolled into the [facility] program and services Case management related to COPD Home O2 Non-adherent with medication related home medication (Nystatin) Non-adherent related medical appointments Follow up related [facility] A/D and HCPOA   Plan:  Physical assessment completed with no acute issues encountered upon today's assessment. Will verify a signs consent in noted in EPIC due to his recent discharge and involvement with the  hospital liaison Charlesetta Shanks). Will discussed and educate pt on COPD with printable information and visual aides to assist pt with understanding how to manage his COPD and prevent acute events with flare-ups. Will provide EMMI information (COPD/What you can do/When to get help and Your Lungs). Information discussed and the COPD action plan provided and discussed into for teach back methods with ongoing education. Will verify pt remains in the GREEN zone and aware of what to do if acute signs and symptoms that have been discussed today for urgent verses emergent care. Will continue to review and continue ongoing education for pt's knowledge base and quick response to prevent acute stages of occurring symptoms.  Will verify pt is using the prescribed amount of oxygen iai settings (3 liters) and concentrator continues to be maintained and services by the agency. Will verify no problems or issues have occurred with this current concentrator. Will also verify pt uses his portable tanks and aware of refills or who to call if he wants to go out of town and needs tanks services on his road trips. Will discuss pulse  ox and what the device reads with his oxygenation levels with saturations. Resources provided with local pharmacies.  Will review and discuss all medications and verified pt's awareness of all his medications. Will strongly encouraged pt to take all prescribed medications and demonstrate dosage draw of 5 mls on Nystatin medication and how often to use based upon the prescribed prescription. Will verify pt's understanding and able to draw up 5 mls as demonstrated today for two dose to be administered later today. Pt will understanding of twice a day usage and to swash and swallow as prescribed until empty with no refills on this medication however if symptoms do not improved pt aware to contact his provider. Will also inform pt to rinse mouth after every use of his Dulera inhaler as a prevention measure from further irritation (pt with clear understanding). Will strongly encouraged pt to attend all medical appointments even he has to work out small incremented payments with the providers office. Will stress the importance of attending his medical appointments in regulating his medications and treatment plan. Pt with understanding and will attempt to adhere to all medical appointments and reschedule soon after to accommodate his co-pays.  Will contact personnel services at [facility] [phone]) with the request and/or inquires the pt has at this time (needing another talking watch, sunglasses to prevent impaired vision and short cooking gloves). Left voice message requesting a call back to this RN or the pt for further details on pt's request. Note these items have been promised but have not been received after several inquires states pt. Will inquire on Living Will and Health Care Power of Atty and provided education on both and information packet. Will request pt review and completed with family member for his records and make this RN aware when he has completed this task. RN will  encouraged pt completed over the next few months due to the upcoming Holidays.  Plan of care discussed and goals as pt receptive to all that has been discussed and willing to manage his care with the information that has been provided (printable information and [facility] calendar tools for documentation). Pt has requested if RN can come twice month on occassions to keep him on track in managing his care. No other inquires or request at this time.   Elliot Cousin, RN Care Management Coordinator [facility] Main Office [phone]

## 2015-02-16 NOTE — Progress Notes (Signed)
Cardiology Office Note   Date:  02/17/2015   ID:  Mitchell BossierJarald Dean Hon, DOB 08/20/1940, MRN [id number]  PCP:   Duane Lopeoss, Alan, MD  Cardiologist:  Dr. Verdis PrimeHenry Smith   Electrophysiologist:  n/a  Chief Complaint  Patient presents with  . Hospitalization Follow-up  . Coronary Artery Disease     History of Present Illness: Mitchell [name] is a 74 y.o. male with a hx of CAD status post prior stenting to the LAD in 2007 and DES to the PDA in 2013, HTN, HL, COPD last seen by Dr. Katrinka BlazingSmith 6/16.  Admitted 10/16 with progressive shortness of breath with associated increased cough and sputum production. He was treated for AECOPD. He was seen by cardiology for possible congestive heart failure. He has a history of LE edema and abdominal fullness. Echocardiogram demonstrated normal LV function and normal diastolic function. Estimated CVP was noted to be 2 cm. BNP was normal at 40. Troponin levels were normal. No evidence of CHF was noted.  Returns for FU.  He remains short of breath with any activity.  He is quite frustrated and describes a type of O2 mask used in the hospital that helped him more than the mask that was delivered to him.  He has FU with pulmonology.  He denies chest pain like his prior angina.  He denies syncope, PND.  LE edema is stable.   Studies/Reports Reviewed Today:  Echo 02/02/15 EF 55-60%, normal wall motion, normal diastolic function  LHC 10/13 LM: Patent LAD: Proximal luminal irregularities, diagonal/LAD bifurcational stenting patent, mid LAD 50% LCx: Luminal irregularities RCA: PDA 99% > > PCI: DES EF 50%    Past Medical History  Diagnosis Date  . Hypertension   . Hyperlipidemia   . Depression   . BPH (benign prostatic hypertrophy)   . Anxiety   . GERD (gastroesophageal reflux disease)   . Coronary artery disease   . Myocardial infarction (HCC) 01/22/2012  . Anginal pain (HCC) 01/2012  . COPD (chronic obstructive pulmonary disease) (HCC)     "maybe"  (02/14/2012)  . Shortness of breath     "walking and laying down sometimes" (02/14/2012)    Past Surgical History  Procedure Laterality Date  . Ganglion cyst removed  1956?    right  . Coronary angioplasty with stent placement  05/11/2005    PCI OF LAD  . Cataract extraction w/ intraocular lens  implant, bilateral  ~ 2010  . Refractive surgery  2010    left  . Left heart catheterization with coronary angiogram Bilateral 01/24/2012    Procedure: LEFT HEART CATHETERIZATION WITH CORONARY ANGIOGRAM;  Surgeon: Lesleigh NoeHenry W Smith III, MD;  [facility];  Service: Cardiovascular;  Laterality: Bilateral;     Current Outpatient Prescriptions  Medication Sig Dispense Refill  . albuterol (PROVENTIL HFA;VENTOLIN HFA) 108 (90 BASE) MCG/ACT inhaler Inhale 2 puffs into the lungs every 6 (six) hours as needed for wheezing. For shortness of breath    . albuterol (PROVENTIL) (2.5 MG/3ML) 0.083% nebulizer solution Take 3 mLs (2.5 mg total) by nebulization 3 (three) times daily. 75 mL 0  . aspirin 325 MG tablet Take 1 tablet by mouth daily. AS NEEDED FOR HEADACHE AT BEDTIME    . aspirin EC 81 MG EC tablet Take 1 tablet (81 mg total) by mouth daily.    Marland Kitchen. atenolol (TENORMIN) 25 MG tablet Take 1 tablet (25 mg total) by mouth every morning. 90 tablet 3  . benzonatate (TESSALON) 100 MG capsule Take  1 capsule (100 mg total) by mouth 3 (three) times daily as needed for cough. 60 capsule 0  . budesonide-formoterol (SYMBICORT) 160-4.5 MCG/ACT inhaler Inhale 2 puffs into the lungs 2 (two) times daily.    . clopidogrel (PLAVIX) 75 MG tablet Take 1 tablet (75 mg total) by mouth daily with breakfast. 90 tablet 3  . furosemide (LASIX) 20 MG tablet Take 1 tablet (20 mg total) by mouth 2 (two) times daily. 180 tablet 3  . guaiFENesin (MUCINEX) 600 MG 12 hr tablet Take 2 tablets (1,200 mg total) by mouth 2 (two) times daily. 30 tablet 0  . levofloxacin (LEVAQUIN) 750 MG tablet Take 1 tablet (750 mg total) by mouth  daily. X 7days 7 tablet 0  . mometasone-formoterol (DULERA) 100-5 MCG/ACT AERO Inhale 2 puffs into the lungs 2 (two) times daily. 1 Inhaler 6  . nitroGLYCERIN (NITROSTAT) 0.4 MG SL tablet Place 0.4 mg under the tongue every 5 (five) minutes as needed. FOR CHEST PAIN    . nystatin (MYCOSTATIN) 100000 UNIT/ML suspension Take 5 mLs (500,000 Units total) by mouth 2 (two) times daily. Swish & Swallow. 100 mL 0  . predniSONE (STERAPRED UNI-PAK 21 TAB) 10 MG (21) TBPK tablet As directed  0  . sodium chloride (OCEAN) 0.65 % SOLN nasal spray Place 1 spray into both nostrils as needed for congestion.    . tamsulosin (FLOMAX) 0.4 MG CAPS capsule Take 0.4 mg by mouth daily after breakfast.      No current facility-administered medications for this visit.    Allergies:   Lipitor and Statins    Social History:   Social History   Social History  . Marital Status: Single    Spouse Name: N/A  . Number of Children: N/A  . Years of Education: N/A   Social History Main Topics  . Smoking status: Former Smoker -- 42 years    Types: Pipe    Quit date: 04/28/2011  . Smokeless tobacco: Never Used  . Alcohol Use: 3.5 oz/week    7 Standard drinks or equivalent per week     Comment: 02/14/2012 "weekends I have 6-7 mixed drinks"     occasional  . Drug Use: No  . Sexual Activity: Yes   Other Topics Concern  . None   Social History Narrative   Truck driver          Family History:   Family History  Problem Relation Age of Onset  . Heart failure Mother   . Heart failure Brother      ROS:   Please see the history of present illness.   Review of Systems  All other systems reviewed and are negative.    PHYSICAL EXAM: VS:  BP 118/70 mmHg  Pulse 80  Ht 6' (1.829 m)  Wt 230 lb 6.4 oz (104.509 kg)  BMI 31.24 kg/m2    Wt Readings from Last 3 Encounters:  02/17/15 230 lb 6.4 oz (104.509 kg)  02/15/15 227 lb (102.967 kg)  02/02/15 233 lb 3.2 oz (105.779 kg)    GEN: Well nourished, well  developed, in no acute distress HEENT: normal Neck: no JVD,  no masses Cardiac:  Distant S1/S2, RRR; no murmur, no rubs or gallops, no edema   Respiratory:  Decreased breath sounds with faint, diffuse expiratory wheezing GI: soft, nontender, nondistended, + BS MS: no deformity or atrophy Skin: warm and dry  Neuro:  CNs II-XII intact, Strength and sensation are intact Psych: Normal affect   EKG:  EKG  is ordered today.  It demonstrates:   NSR, HR 80, normal axis, QTc 449 ms   Recent Labs: 01/31/2015: B Natriuretic Peptide 40.9; TSH 0.608 02/01/2015: ALT 17 02/03/2015: BUN 24*; Creatinine, Ser 1.21; Hemoglobin 14.8; Platelets 158; Potassium 4.1; Sodium 137    Lipid Panel    Component Value Date/Time   CHOL 167 04/30/2013 1043   TRIG 75.0 04/30/2013 1043   HDL 64.90 04/30/2013 1043   CHOLHDL 3 04/30/2013 1043   VLDL 15.0 04/30/2013 1043   LDLCALC 87 04/30/2013 1043      ASSESSMENT AND PLAN:  1. CAD:  S/p prior LAD and PDA stenting.  Recent admission with AECOPD. He is not having any symptoms reminiscent of his prior angina.  Continue ASA, beta-blocker, Plavix. He is intol of statin rx.   2. COPD:  He remains significantly short of breath.  He is frustrated about his O2 equipment. I have asked him to call Pulmonology for help with managing his O2 equipment.  He should FU as planned.  3. HTN:  Controlled.   4. Hyperlipidemia:  Statin intolerant.  LDL in 1/15 was 87.       Medication Changes: Current medicines are reviewed at length with the patient today.  Concerns regarding medicines are as outlined above.  The following changes have been made:   Discontinued Medications   FUROSEMIDE (LASIX) 20 MG TABLET    Take 1 tablet (20 mg total) by mouth daily. Fluid pill   Modified Medications   Modified Medication Previous Medication   ATENOLOL (TENORMIN) 25 MG TABLET atenolol (TENORMIN) 25 MG tablet      Take 1 tablet (25 mg total) by mouth every morning.    Take 25 mg by  mouth every morning.    CLOPIDOGREL (PLAVIX) 75 MG TABLET clopidogrel (PLAVIX) 75 MG tablet      Take 1 tablet (75 mg total) by mouth daily with breakfast.    Take 1 tablet (75 mg total) by mouth daily with breakfast.   FUROSEMIDE (LASIX) 20 MG TABLET furosemide (LASIX) 20 MG tablet      Take 1 tablet (20 mg total) by mouth 2 (two) times daily.    Take 20 mg by mouth 2 (two) times daily.   New Prescriptions   No medications on file   Labs/ tests ordered today include:   Orders Placed This Encounter  Procedures  . EKG 12-Lead     Disposition:    FU with Dr. Verdis Prime as planned.     Signed, Brynda Rim, MHS 02/17/2015 4:52 PM    [facility] [address] [place], [place], [place]  [address] Phone: [phone]; Fax: [phone]

## 2015-02-17 ENCOUNTER — Ambulatory Visit (INDEPENDENT_AMBULATORY_CARE_PROVIDER_SITE_OTHER): Payer: Commercial Managed Care - HMO | Admitting: Physician Assistant

## 2015-02-17 ENCOUNTER — Encounter: Payer: Self-pay | Admitting: Physician Assistant

## 2015-02-17 VITALS — BP 118/70 | HR 80 | Ht 72.0 in | Wt 230.4 lb

## 2015-02-17 DIAGNOSIS — E785 Hyperlipidemia, unspecified: Secondary | ICD-10-CM | POA: Diagnosis not present

## 2015-02-17 DIAGNOSIS — J449 Chronic obstructive pulmonary disease, unspecified: Secondary | ICD-10-CM

## 2015-02-17 DIAGNOSIS — I10 Essential (primary) hypertension: Secondary | ICD-10-CM

## 2015-02-17 DIAGNOSIS — I2510 Atherosclerotic heart disease of native coronary artery without angina pectoris: Secondary | ICD-10-CM | POA: Diagnosis not present

## 2015-02-17 MED ORDER — FUROSEMIDE 20 MG PO TABS: 20.0000 mg | ORAL_TABLET | Freq: Two times a day (BID) | ORAL | Status: DC

## 2015-02-17 MED ORDER — ATENOLOL 25 MG PO TABS: 25.0000 mg | ORAL_TABLET | Freq: Every morning | ORAL | Status: DC

## 2015-02-17 MED ORDER — CLOPIDOGREL BISULFATE 75 MG PO TABS: 75.0000 mg | ORAL_TABLET | Freq: Every day | ORAL | Status: DC

## 2015-02-17 NOTE — Patient Instructions (Signed)
Medication Instructions:  REFILLS SENT IN FOR PLAVIX, ATENOLOL, LASIX TO [facility]  Labwork: NONE  Testing/Procedures: NONE  Follow-Up: FOLLOW UP WITH DR. Katrinka BlazingSMITH AS PLANNED  Any Other Special Instructions Will Be Listed Below (If Applicable). PER SCOTT WEAVER, PAC YOU WILL NEED TO CALL PULMONOLOGY OFFICE TO SEE IF THEY CAN HELP YOU GET THE O2 MASK THAT HELPS   If you need a refill on your cardiac medications before your next appointment, please call your pharmacy.

## 2015-02-21 ENCOUNTER — Other Ambulatory Visit: Payer: Self-pay | Admitting: *Deleted

## 2015-02-23 NOTE — Patient Outreach (Signed)
[facility]) Care Management  02/21/2015  Mitchell BossierJarald Dean [name] [birth year] [id number]  Transition of Care  RN attempted to contact pt however unsuccessful. Will continue outreach calls.   Elliot CousinLisa Averlee Swartz, RN Care Management Coordinator [facility] Main Office [phone]

## 2015-02-28 ENCOUNTER — Other Ambulatory Visit: Payer: Self-pay | Admitting: *Deleted

## 2015-02-28 DIAGNOSIS — J441 Chronic obstructive pulmonary disease with (acute) exacerbation: Secondary | ICD-10-CM

## 2015-02-28 NOTE — Patient Outreach (Signed)
[facility]) Care Management  02/28/2015  Allie BossierJarald Dean Balthazar 1940/08/14 [id number]   Request from Elliot CousinLisa Matthews, RN to assign Pharmacy, assigned Steve Rattlerawn Pettus, PharmD.  Thanks, Corrie MckusickLisa O. Sharlee BlewMoore, AABA [facility] Care [facility] CM Assistant Phone: [phone] Fax: [phone]

## 2015-02-28 NOTE — Patient Outreach (Signed)
[facility]) Care Management  02/28/2015  Allie BossierJarald Dean Saia 1940/05/14 [id number]  Transition of care (4th outreach)  RN spoke with pt today as he reports he continues to do well however recent office visit he was prescribed 4 different types of medications however the doctor's office called the medications into [facility] and he usually has them called into [facility]. States the medication were to expensive and he only picked up one medications his fluid pill. The other he could not afford and did not obtain. Pt state he was too tire to talk to anyone else today and was going to take a snap. RN inquired if the medications were any of his inhalers or important medications he needed to start immediately. Pt states no the medications were not urgent and for his cholesterol but can't remember the other and did not wish to review his medications at this time. RN offered to [facility] to get his medications to the right location or assist with obtain his medication not to delay it any further. Pt agreed but requested the pharmacy contact his in the morning between 8:30-9:00AM ([facility]) to assist pt further. Pt has also requested RN follow up at the end of this week to inquire on how he is doing and if he has is medications. Will follow up accordingly.  Elliot CousinLisa Damier Disano, RN Care Management Coordinator [facility] Main Office [phone]

## 2015-03-04 ENCOUNTER — Other Ambulatory Visit: Payer: Self-pay

## 2015-03-04 ENCOUNTER — Other Ambulatory Visit: Payer: Self-pay | Admitting: *Deleted

## 2015-03-04 NOTE — Patient Outreach (Signed)
[facility]) Care Management  03/04/2015  Allie BossierJarald Dean Morton 09/14/1940 [id number]   Pharmacy issues  RN follow up with pt today concerning his medications however pt reports she has not resolved this matter yet and will need to go out of town today. RN attempted to assist further on what medications pt needs as he continued to explained issues with [facility] and what medications he can afford but unable to identify those medications needed. Pt [facility] to further address exactly what is needed. RN strongly encouraged pt to be available for [facility] to address this issues with missing medications. Pt states he will be available on Monday at 9:00AM. RN offered to contact [facility](Dawn Pettus) who has assisted this pt in the past concerning his current medication issues. Pt was receptive however requested a call today with a possible visit on Monday. RN strongly encouraged pt to attempt to get his medication prior to going out of town to avoid possible delays or issues related to the absence of any of his medications. [facility] and request a call to pt today for possible assistance with his medications. RN offered to follow up Monday as pt requested late afternoon call to inquire.  Elliot CousinLisa Quinntin Malter, RN Care Management Coordinator [facility] Main Office [phone]

## 2015-03-04 NOTE — Patient Outreach (Signed)
I reached out to [name] after talking with Elliot CousinLisa Matthews, RN, his nurse care manager.  She stated he was out of some of his medications.  I called [name] and he stated he had all his medications.  He stated he had gone to the pharmacy to pick up some medications and 3 of them were around $100.  He did not pick those up.  I called [facility] and they stated it was his finasteride, benzonatate, and his tamasulosin.  He asked for me to come do a home visit on Monday, November 21 at 12 pm.  I will review his medications at that time and determine if he can get those medications from [facility] at a lower cost.    Steve Rattlerawn Zhamir Pirro, PharmD, [employer]ACP [facility] Pharmacy Manager [phone]

## 2015-03-07 ENCOUNTER — Other Ambulatory Visit: Payer: Self-pay | Admitting: *Deleted

## 2015-03-07 ENCOUNTER — Other Ambulatory Visit: Payer: Self-pay

## 2015-03-07 NOTE — Patient Outreach (Addendum)
[facility]) Care Management  03/07/2015  [name] [birth year] [id number]   [facility](Dawn Pettus) while visiting in the pt's home today. Note pharmacy was recently referral to assist pt with his medication issues noted on a telephonic call last Friday. Pharmacy inquired on obtaining some compression stockings for this pt today and requested RN to provided during a home visit on tomorrow.  RN requested specific measurements for the correct size and will attempt to obtain by the home visit tomorrow. RN will follow up tomorrow at the pt's home for further intake on the routine home visit and once again verify the fitting for the compression stockings. No other request or inquires from the pt while pharmacy connected with the RN telephonically.   Pharmacy followed up with RN on the measurements for the compression that RN will confirm prior to TEDs being provided.  Elliot CousinLisa Louay Myrie, RN Care Management Coordinator [facility] Main Office [phone]

## 2015-03-07 NOTE — Patient Outreach (Signed)
[facility] Care Management  [facility]   03/07/2015  Mitchell BossierJarald Dean [name] [birth year] [id number]  Subjective: Mitchell [name] is a 74 year old male who was referred to pharmacy for a medication review.  When I talked to him on Friday, March 04, 2015, he did not know which medications he had and which one he did not.  When I arrived this morning, he had his medications divided into what he is taking regularly and his extra medications.  He has poor sight but is able to fill his pill box.  He is able to read the bottle and has a system that allows him to know where his medications are in the house and which ones to take when.  I reviewed his medications and the only one he was missing in his pill box was finasteride.  He had two 90 day bottles sitting in the extra medication section.  He did not have albuterol to use as a rescue medication.  He was out of guaifenesin and Nitrostat.  He will need prescriptions for all three of them.  He also stated he was taking diphenhydramine 50 mg at bedtime to help him sleep.    Objective:   Current Medications: Current Outpatient Prescriptions  Medication Sig Dispense Refill  . aspirin 325 MG tablet Take 1 tablet by mouth daily. AS NEEDED FOR HEADACHE AT BEDTIME    . aspirin EC 81 MG EC tablet Take 1 tablet (81 mg total) by mouth daily.    Marland Kitchen. atenolol (TENORMIN) 25 MG tablet Take 1 tablet (25 mg total) by mouth every morning. 90 tablet 3  . benzonatate (TESSALON) 100 MG capsule Take 1 capsule (100 mg total) by mouth 3 (three) times daily as needed for cough. 60 capsule 0  . clopidogrel (PLAVIX) 75 MG tablet Take 1 tablet (75 mg total) by mouth daily with breakfast. 90 tablet 3  . diphenhydrAMINE (BENADRYL) 25 MG tablet Take 25 mg by mouth every 6 (six) hours as needed.    . finasteride (PROSCAR) 5 MG tablet Take 5 mg by mouth daily.    . furosemide (LASIX) 20 MG tablet Take 1 tablet (20 mg total) by mouth 2 (two) times daily. 180 tablet 3   . mometasone-formoterol (DULERA) 100-5 MCG/ACT AERO Inhale 2 puffs into the lungs 2 (two) times daily. 1 Inhaler 6  . Multiple Vitamins-Minerals (HEALTHY EYES) TABS Take 1 tablet by mouth daily.    Marland Kitchen. nystatin (MYCOSTATIN) 100000 UNIT/ML suspension Take 5 mLs by mouth 2 (two) times daily as needed.    . sodium chloride (OCEAN) 0.65 % SOLN nasal spray Place 1 spray into both nostrils as needed for congestion.    . tamsulosin (FLOMAX) 0.4 MG CAPS capsule Take 0.4 mg by mouth daily after breakfast.     . albuterol (PROVENTIL HFA;VENTOLIN HFA) 108 (90 BASE) MCG/ACT inhaler Inhale 2 puffs into the lungs every 6 (six) hours as needed for wheezing. For shortness of breath    . albuterol (PROVENTIL) (2.5 MG/3ML) 0.083% nebulizer solution Take 3 mLs (2.5 mg total) by nebulization 3 (three) times daily. (Patient not taking: Reported on 03/07/2015) 75 mL 0  . guaiFENesin (MUCINEX) 600 MG 12 hr tablet Take 2 tablets (1,200 mg total) by mouth 2 (two) times daily. (Patient not taking: Reported on 03/07/2015) 30 tablet 0  . nitroGLYCERIN (NITROSTAT) 0.4 MG SL tablet Place 0.4 mg under the tongue every 5 (five) minutes as needed. FOR CHEST PAIN     No current  facility-administered medications for this visit.    Functional Status: In your present state of health, do you have any difficulty performing the following activities: 02/15/2015 01/31/2015  Hearing? N N  Vision? N N  Difficulty concentrating or making decisions? N N  Walking or climbing stairs? N N  Dressing or bathing? N N  Doing errands, shopping? N N  Preparing Food and eating ? N -  Using the Toilet? N -  In the past six months, have you accidently leaked urine? N -  Do you have problems with loss of bowel control? N -  Managing your Medications? N -  Managing your Finances? N -  Housekeeping or managing your Housekeeping? N -    Fall/Depression Screening: PHQ 2/9 Scores 02/15/2015  PHQ - 2 Score 0    Assessment: 1.  Medication  adherence:  Mitchell [name] is adherent to his medication if he knows which medications he has.  He did not realize he had finasteride and this is why he was not taking it.  He has all the medications with the exception of guaifenesin and nitrostat.  He does not have a prescription for albuterol but he would benefit from having one.   2.  Diphenhydramine:  He was taking a large dose of diphenhydramine to help him sleep.  It can contribute to not being able to go to the bathroom and could lead to falls if he remains sleepy after waking.  He is willing to decrease the dose but not to stop completely until his discusses an alternative with his provider.   Plan: 1.  I organized all of Mitchell [name] medications so he has amble supply of all of them. I was able to fill a pill box for 30 days.  He is confident that he will be able to continue to fill his pill box. 2.  I will send a letter to Dr. Tenny Craw asking for albuterol, nitrostat and guaifenesin prescriptions to be sent to [facility].  3 . I will follow up in 3 weeks to see how he is doing with compliance.   4.  I have updated Mitchell Cousin, RN of my findings.    [facility] CM Care Plan Problem One        Most Recent Value   Care Plan Problem One  medication adherence   Role Documenting the Problem One  Clinical Pharmacist   Care Plan for Problem One  Active   [facility] CM Short Term Goal #1 (0-30 days)  Mitchell [name] will be able to take his medications daily for the next 30 days with an adherence rate of at least 80% evidence by patient report.   [facility] CM Short Term Goal #1 Start Date  03/07/15   Interventions for Short Term Goal #1  I went over all his medications with him.  I gave him a pill box that would allow him to fill it for 30 days.  I counseled on the number of medications that should be in each box.  I reviewed his system of how he remembers to take his medications.      Steve Rattler, PharmD, [employer] [facility] [phone]

## 2015-03-08 ENCOUNTER — Other Ambulatory Visit: Payer: Self-pay | Admitting: *Deleted

## 2015-03-08 NOTE — Patient Outreach (Signed)
[facility]) Care Management   03/08/2015  Mitchell [name] April 02, 1941 [id number]  Mitchell [name] is an 74 y.o. male  Subjective:  COPD: Pt reports he is doing well with no acute episodes. Pt continues to use her home O2 on the recommended liters with no problems breathing other then his normal SOB upon activities. Pt states he is aware when to contact his provider if he is distress. Pt was able to recite some of the signs and symptoms of the COPD action plan and states he is in the GREEN area today with no distress.  MEDICATION: Pt reports since the visit with the [facility] pharmacy [facility] yesterday he has all his medications "straight". Pt also states the pharmacist filled up a new pill box that will last a lot longer then before.  No addition issues at this time as this matter has been solved.  MEDICAL APPOINTMENTS: Pt states he has attended all his appointments so far and will try to go to all he can. Currently pt seek assistance from his ex-wife with some transportation but pt is able to drive. Pt has numbers to all his providers and can contact if needed for any changes or questions. Pt will contact his primary provider today and inquire if he can take the OTC Mucinex for his ongoing chest congestion.  EDEMA: Requested compression stockings to assist with his minimal swelling to his lower legs (provider is aware).   Objective:   Review of Systems  Constitutional: Negative.   HENT: Negative.   Eyes: Negative.   Respiratory: Positive for cough.        Cough with no sputum.  Cardiovascular: Negative.   Gastrointestinal: Negative.   Genitourinary: Negative.   Musculoskeletal: Negative.   Skin: Negative.   Neurological: Negative.   Endo/Heme/Allergies: Negative.   Psychiatric/Behavioral: Negative.     Physical Exam  Constitutional: He is oriented to person, place, and time. He appears well-developed and well-nourished.  HENT:  Right Ear: External ear  normal.  Left Ear: External ear normal.  Nose: Nose normal.  Neck: Normal range of motion.  Cardiovascular: Normal heart sounds.   Respiratory: Effort normal and breath sounds normal.  GI: Soft. Bowel sounds are normal.  Musculoskeletal: Normal range of motion.  Neurological: He is alert and oriented to person, place, and time.  Skin: Skin is warm and dry.  Psychiatric: He has a normal mood and affect. His behavior is normal. Judgment and thought content normal.    Current Medications:   Current Outpatient Prescriptions  Medication Sig Dispense Refill  . albuterol (PROVENTIL HFA;VENTOLIN HFA) 108 (90 BASE) MCG/ACT inhaler Inhale 2 puffs into the lungs every 6 (six) hours as needed for wheezing. For shortness of breath    . aspirin 325 MG tablet Take 1 tablet by mouth daily. AS NEEDED FOR HEADACHE AT BEDTIME    . aspirin EC 81 MG EC tablet Take 1 tablet (81 mg total) by mouth daily.    Marland Kitchen atenolol (TENORMIN) 25 MG tablet Take 1 tablet (25 mg total) by mouth every morning. 90 tablet 3  . benzonatate (TESSALON) 100 MG capsule Take 1 capsule (100 mg total) by mouth 3 (three) times daily as needed for cough. 60 capsule 0  . clopidogrel (PLAVIX) 75 MG tablet Take 1 tablet (75 mg total) by mouth daily with breakfast. 90 tablet 3  . diphenhydrAMINE (BENADRYL) 25 MG tablet Take 25 mg by mouth every 6 (six) hours as needed.    . finasteride (  PROSCAR) 5 MG tablet Take 5 mg by mouth daily.    . furosemide (LASIX) 20 MG tablet Take 1 tablet (20 mg total) by mouth 2 (two) times daily. 180 tablet 3  . mometasone-formoterol (DULERA) 100-5 MCG/ACT AERO Inhale 2 puffs into the lungs 2 (two) times daily. 1 Inhaler 6  . Multiple Vitamins-Minerals (HEALTHY EYES) TABS Take 1 tablet by mouth daily.    . nitroGLYCERIN (NITROSTAT) 0.4 MG SL tablet Place 0.4 mg under the tongue every 5 (five) minutes as needed. FOR CHEST PAIN    . nystatin (MYCOSTATIN) 100000 UNIT/ML suspension Take 5 mLs by mouth 2 (two) times  daily as needed.    . sodium chloride (OCEAN) 0.65 % SOLN nasal spray Place 1 spray into both nostrils as needed for congestion.    . tamsulosin (FLOMAX) 0.4 MG CAPS capsule Take 0.4 mg by mouth daily after breakfast.     . guaiFENesin (MUCINEX) 600 MG 12 hr tablet Take 2 tablets (1,200 mg total) by mouth 2 (two) times daily. (Patient not taking: Reported on 03/07/2015) 30 tablet 0   No current facility-administered medications for this visit.    Functional Status:   In your present state of health, do you have any difficulty performing the following activities: 02/15/2015 01/31/2015  Hearing? N N  Vision? N N  Difficulty concentrating or making decisions? N N  Walking or climbing stairs? N N  Dressing or bathing? N N  Doing errands, shopping? N N  Preparing Food and eating ? N -  Using the Toilet? N -  In the past six months, have you accidently leaked urine? N -  Do you have problems with loss of bowel control? N -  Managing your Medications? N -  Managing your Finances? N -  Housekeeping or managing your Housekeeping? N -    Fall/Depression Screening:    PHQ 2/9 Scores 02/15/2015  PHQ - 2 Score 0   Filed Vitals:   03/08/15 0909  BP: 120/70  Pulse: 64  Resp: 20   Assessment:   Ongoing case management related to COPD Respiratory issues related to upper chest congestion upon auscultation Follow up on medication issues related to prescription medications. Follow up on medical appointments related to scheduled visits. Bilateral edema related to swelling to lower legs/ankles  Plan:  Physical assessment completed with no acute issues noted today. Will reiterate on the COPD action plan and verify if pt remains in the GREEN zone and aware of how to respond to acute episodes. Pt abe to teach back some of the signs and symptoms however RN reviewed this topic once again. Will review plan of care and adjust goals accordingly to allow adherence of pt's awareness.  Due to findings today  with upper throat/chest congestion RN will verify no fevers, chills or symptoms of possible infection and encouraged pt to take OTC Mucinex  (expectorant) however seek clearance with his primary provider prior to adminsitering this medication if he has not taken this medication in the past. Will verify bilateral lobes remain clear. Will verified pt has all her medications and taking all medications since the home visit consult has taken place on yesterday with Steve Rattler (pharmacy) for assistance. Pt confirms a few changes and has been compliant with the recommended changes from pharmacy.  Will confirm pt has attending all scheduled medical appointments with no delays or cancellation mentioned during today's home visit. Confirmed pt has sufficient transportation to and from her medical appointments. Will continue to encouraged adherence  with future appointments as a prevention measure due to pt's fragile state and risk of infection. Will re-measure pt's legs for the requested compression stockings pt has requested today. Measures completed for stocking sizes and RN offered to assist pt by demonstrating the correct way to apply to compression stockings and informed pt to removed to compression stockings prior to going to bed. Pt aware not to wear the compression stockings to bed.  Plan to follow up in few weeks and re-evaluate plan of care and goals that were adjusted today to allow adherence. No other inquires or request at this time.    Elliot CousinLisa Tamsen Reist, RN Care Management Coordinator [facility] Main Office [phone]

## 2015-03-15 ENCOUNTER — Ambulatory Visit (INDEPENDENT_AMBULATORY_CARE_PROVIDER_SITE_OTHER): Payer: Commercial Managed Care - HMO | Admitting: Adult Health

## 2015-03-15 ENCOUNTER — Encounter: Payer: Self-pay | Admitting: Adult Health

## 2015-03-15 VITALS — BP 128/70 | HR 71 | Temp 98.0°F | Ht 72.0 in | Wt 232.0 lb

## 2015-03-15 DIAGNOSIS — J449 Chronic obstructive pulmonary disease, unspecified: Secondary | ICD-10-CM | POA: Diagnosis not present

## 2015-03-15 DIAGNOSIS — J9610 Chronic respiratory failure, unspecified whether with hypoxia or hypercapnia: Secondary | ICD-10-CM | POA: Insufficient documentation

## 2015-03-15 DIAGNOSIS — J9611 Chronic respiratory failure with hypoxia: Secondary | ICD-10-CM | POA: Diagnosis not present

## 2015-03-15 NOTE — Assessment & Plan Note (Signed)
Cont on O2 with act  

## 2015-03-15 NOTE — Patient Instructions (Signed)
Continue on Dulera 2 puffs Twice daily  , rinse after use.  Use oxygen with activity As needed   follow up Dr. Vassie LollAlva  In 3-4 months with PFT.

## 2015-03-15 NOTE — Progress Notes (Signed)
Subjective:    Patient ID: Mitchell [name], male    DOB: 04-25-1940, 74 y.o.   MRN: [id number]  HPI 74 yo male with severe COPD seen for pulmonary consult 01/2015 with Dr. Vassie LollAlva   Former pipe smoker (2013)   TEST  Spirometry 01/2015 FEV1 39% 2 D echo with EF 55-60% , nml diastolic fxn   03/15/2015 Follow up : COPD  Returns for 1 month follow up .  Says he has good and bad days. Is independent , takes care of house and drives.  Says he goes out a lot. Get winded with walking long distances.  Says he is a lot of better from recent hospitalization.  Last ov continued on Dulera . Needs PFT in future . Has occasional cough , gets white mucus mostly .  Flu shot utd. Thinks his pnuemovax is utd.  Uses oxygen As needed  , feels he does not need it that much.  Denies any sinus pressure/drainage, chest tightness/congestion, wheezing, fever, nausea or vomiting.    Past Medical History  Diagnosis Date  . Hypertension   . Hyperlipidemia   . Depression   . BPH (benign prostatic hypertrophy)   . Anxiety   . GERD (gastroesophageal reflux disease)   . Coronary artery disease   . Myocardial infarction (HCC) 01/22/2012  . Anginal pain (HCC) 01/2012  . COPD (chronic obstructive pulmonary disease) (HCC)     "maybe" (02/14/2012)  . Shortness of breath     "walking and laying down sometimes" (02/14/2012)   Current Outpatient Prescriptions on File Prior to Visit  Medication Sig Dispense Refill  . albuterol (PROVENTIL HFA;VENTOLIN HFA) 108 (90 BASE) MCG/ACT inhaler Inhale 2 puffs into the lungs every 6 (six) hours as needed for wheezing. For shortness of breath    . aspirin 325 MG tablet Take 1 tablet by mouth daily. AS NEEDED FOR HEADACHE AT BEDTIME    . aspirin EC 81 MG EC tablet Take 1 tablet (81 mg total) by mouth daily.    Marland Kitchen. atenolol (TENORMIN) 25 MG tablet Take 1 tablet (25 mg total) by mouth every morning. 90 tablet 3  . benzonatate (TESSALON) 100 MG capsule Take 1 capsule (100 mg  total) by mouth 3 (three) times daily as needed for cough. 60 capsule 0  . clopidogrel (PLAVIX) 75 MG tablet Take 1 tablet (75 mg total) by mouth daily with breakfast. 90 tablet 3  . diphenhydrAMINE (BENADRYL) 25 MG tablet Take 25 mg by mouth every 6 (six) hours as needed.    . finasteride (PROSCAR) 5 MG tablet Take 5 mg by mouth daily.    . furosemide (LASIX) 20 MG tablet Take 1 tablet (20 mg total) by mouth 2 (two) times daily. 180 tablet 3  . guaiFENesin (MUCINEX) 600 MG 12 hr tablet Take 2 tablets (1,200 mg total) by mouth 2 (two) times daily. 30 tablet 0  . mometasone-formoterol (DULERA) 100-5 MCG/ACT AERO Inhale 2 puffs into the lungs 2 (two) times daily. 1 Inhaler 6  . Multiple Vitamins-Minerals (HEALTHY EYES) TABS Take 1 tablet by mouth daily.    . nitroGLYCERIN (NITROSTAT) 0.4 MG SL tablet Place 0.4 mg under the tongue every 5 (five) minutes as needed. FOR CHEST PAIN    . nystatin (MYCOSTATIN) 100000 UNIT/ML suspension Take 5 mLs by mouth 2 (two) times daily as needed.    . sodium chloride (OCEAN) 0.65 % SOLN nasal spray Place 1 spray into both nostrils as needed for congestion.    . tamsulosin (  FLOMAX) 0.4 MG CAPS capsule Take 0.4 mg by mouth daily after breakfast.      No current facility-administered medications on file prior to visit.      Review of Systems Constitutional:   No  weight loss, night sweats,  Fevers, chills,  +fatigue, or  lassitude.  HEENT:   No headaches,  Difficulty swallowing,  Tooth/dental problems, or  Sore throat,                No sneezing, itching, ear ache,  +nasal congestion, post nasal drip,   CV:  No chest pain,  Orthopnea, PND, swelling in lower extremities, anasarca, dizziness, palpitations, syncope.   GI  No heartburn, indigestion, abdominal pain, nausea, vomiting, diarrhea, change in bowel habits, loss of appetite, bloody stools.   Resp:    No chest wall deformity  Skin: no rash or lesions.  GU: no dysuria, change in color of urine, no  urgency or frequency.  No flank pain, no hematuria   MS:  No joint pain or swelling.  No decreased range of motion.  No back pain.  Psych:  No change in mood or affect. No depression or anxiety.  No memory loss.         Objective:   Physical Exam GEN: A/Ox3; pleasant , NAD, elderly   HEENT:  [place]/AT,  EACs-clear, TMs-wnl, NOSE-clear, THROAT-clear, no lesions, no postnasal drip or exudate noted.   NECK:  Supple w/ fair ROM; no JVD; normal carotid impulses w/o bruits; no thyromegaly or nodules palpated; no lymphadenopathy.  RESP  Decreased BS in bases no accessory muscle use, no dullness to percussion  CARD:  RRR, no m/r/g  , no peripheral edema, pulses intact, no cyanosis or clubbing.  GI:   Soft & nt; nml bowel sounds; no organomegaly or masses detected.  Musco: Warm bil, no deformities or joint swelling noted.   Neuro: alert, no focal deficits noted.    Skin: Warm, no lesions or rashes         Assessment & Plan:

## 2015-03-15 NOTE — Assessment & Plan Note (Addendum)
Controlled on rx .  Check PFT on return ov   Plan  Continue on Dulera 2 puffs Twice daily  , rinse after use.  Use oxygen with activity As needed   follow up Dr. Vassie LollAlva  In 3-4 months with PFT.

## 2015-03-15 NOTE — Addendum Note (Signed)
Addended by: Karalee HeightOX, Keitra Carusone P on: 03/15/2015 10:46 AM   Modules accepted: Orders

## 2015-03-18 NOTE — Progress Notes (Signed)
Reviewed & agree with plan  

## 2015-03-31 ENCOUNTER — Other Ambulatory Visit: Payer: Self-pay

## 2015-03-31 NOTE — Patient Outreach (Signed)
I called [name] to determine how he was doing with his adherence.  He stated he was not doing very well.  He stated he was having a hard time filling the pill box that I gave him.  He asked if I could come out to assist him.  I told him the earliest I could come out would be Wednesday, April 06, 2015. He stated that would be fine.  I will see him then.    Steve Rattlerawn Leeanna Slaby, PharmD, [employer] [facility] Pharmacy Manager [phone]

## 2015-04-06 ENCOUNTER — Other Ambulatory Visit: Payer: Self-pay | Admitting: *Deleted

## 2015-04-06 ENCOUNTER — Other Ambulatory Visit: Payer: Self-pay

## 2015-04-06 NOTE — Patient Outreach (Signed)
[facility]) Care Management  [place]   04/06/2015  Mitchell [name] Nov 11, 1940 [id number]  Subjective: Mitchell [name] is a 74 year old male who I am following for medication adherence.  The purpose of my home visit today is to review the pill box to determine his accuracy of being able to fill it.  He filled it 100% correctly.  He had received a new prescription for metolazone 2.5 mg on Monday, Wednesday and Friday.  He did not place this in the pill box yet.  He knew to just take it.  He does wish to place it in it.  Raina Mina, RN, his nurse care manager is here for a home visit as well.  His only concern is renewing his prescription for benzonatate through [employer].  He admits to only missing one day of his medications.   Objective:   Current Medications: Current Outpatient Prescriptions  Medication Sig Dispense Refill  . aspirin EC 81 MG EC tablet Take 1 tablet (81 mg total) by mouth daily.    Marland Kitchen atenolol (TENORMIN) 25 MG tablet Take 1 tablet (25 mg total) by mouth every morning. 90 tablet 3  . benzonatate (TESSALON) 100 MG capsule Take 1 capsule (100 mg total) by mouth 3 (three) times daily as needed for cough. 60 capsule 0  . clopidogrel (PLAVIX) 75 MG tablet Take 1 tablet (75 mg total) by mouth daily with breakfast. 90 tablet 3  . diphenhydrAMINE (BENADRYL) 25 MG tablet Take 25 mg by mouth every 6 (six) hours as needed. Reported on 04/06/2015    . finasteride (PROSCAR) 5 MG tablet Take 5 mg by mouth daily.    . furosemide (LASIX) 20 MG tablet Take 1 tablet (20 mg total) by mouth 2 (two) times daily. 180 tablet 3  . guaiFENesin (MUCINEX) 600 MG 12 hr tablet Take 2 tablets (1,200 mg total) by mouth 2 (two) times daily. 30 tablet 0  . mometasone-formoterol (DULERA) 100-5 MCG/ACT AERO Inhale 2 puffs into the lungs 2 (two) times daily. 1 Inhaler 6  . Multiple Vitamins-Minerals (HEALTHY EYES) TABS Take 1 tablet by mouth daily.    . nitroGLYCERIN  (NITROSTAT) 0.4 MG SL tablet Place 0.4 mg under the tongue every 5 (five) minutes as needed. FOR CHEST PAIN    . tamsulosin (FLOMAX) 0.4 MG CAPS capsule Take 0.4 mg by mouth daily after breakfast.     . albuterol (PROVENTIL HFA;VENTOLIN HFA) 108 (90 BASE) MCG/ACT inhaler Inhale 2 puffs into the lungs every 6 (six) hours as needed for wheezing. Reported on 04/06/2015    . aspirin 325 MG tablet Take 1 tablet by mouth daily. Reported on 04/06/2015    . nystatin (MYCOSTATIN) 100000 UNIT/ML suspension Take 5 mLs by mouth 2 (two) times daily as needed. Reported on 04/06/2015    . sodium chloride (OCEAN) 0.65 % SOLN nasal spray Place 1 spray into both nostrils as needed for congestion. Reported on 04/06/2015     No current facility-administered medications for this visit.    Functional Status: In your present state of health, do you have any difficulty performing the following activities: 02/15/2015 01/31/2015  Hearing? N N  Vision? N N  Difficulty concentrating or making decisions? N N  Walking or climbing stairs? N N  Dressing or bathing? N N  Doing errands, shopping? N N  Preparing Food and eating ? N -  Using the Toilet? N -  In the past six months, have you accidently leaked  urine? N -  Do you have problems with loss of bowel control? N -  Managing your Medications? N -  Managing your Finances? N -  Housekeeping or managing your Housekeeping? N -    Fall/Depression Screening: PHQ 2/9 Scores 02/15/2015  PHQ - 2 Score 0    Assessment: 1.  Medication adherence:  He is adherent to his medication.  He is also able to fill his pill box correctly.   Plan: 1.  I filled his pill box up for another month. 2.  He is not able to refill his benzonatate until after April 19, 2015 when he receives his check.  He will call [place] after that time to do that.  I gave him the number to do it.  3.  I will follow up in 3 weeks to see how his adherence is going and see if he has any other  pharmacy needs.  Most likely I will close his case since he is doing so well.   THN CM Care Plan Problem One        Most Recent Value   Care Plan Problem One  medication adherence   Role Documenting the Problem One  Clinical Pharmacist   Care Plan for Problem One  Active   THN CM Short Term Goal #1 (0-30 days)  Mitchell [name] will be able to take his medications daily for the next 30 days with an adherence rate of at least 80% evidence by patient report.   THN CM Short Term Goal #1 Start Date  03/07/15   THN CM Short Term Goal #1 Met Date  04/06/15   Interventions for Short Term Goal #1  I went over all his medications with him.  I gave him a pill box that would allow him to fill it for 30 days.  I counseled on the number of medications that should be in each box.  I reviewed his system of how he remembers to take his medications.     THN CM Care Plan Problem Two        Most Recent Value   Care Plan Problem Two  Reordering his medications   Role Documenting the Problem Two  Clinical Pharmacist   Care Plan for Problem Two  Active   THN CM Short Term Goal #1 (0-30 days)  Mitchell [name] will be able to order his benzonatate from [place] order pharmacy by evidence of him having it at my next home visit in the next 30 days   THN CM Short Term Goal #1 Start Date  04/06/15   Interventions for Short Term Goal #2   I wrote the number of [facility] on a piece of paper with the name of the medication for Mitchell [name] to call to order the medication.      Deanne Coffer, PharmD, [place] [phone]

## 2015-04-06 NOTE — Patient Outreach (Addendum)
[facility]) Care Management   04/06/2015  Mitchell BossierJarald Dean [name] [birth year] [id number]  Mitchell BossierJarald Dean [name] is an 74 y.o. male  Subjective:  COPD:  Pt reports how he manages his flare ups with use of OTC Mucinex and his prescribed medications. Pt reports he wears a mask when he is outside the home.  Pt reports he is in the GREEN zone today with no major events.  O2: Pt states he wears his home O2 when needed and he providers are aware. States he recently received a water bottle to attach to the concentrator and uses Distilled H2O indicating this is better for him wit no additional nose bleeds.  MEDICATIONS: Pt states he missed one day of his medications and continues to fill his pill box for an entire month of his medications. Note [facility]ttus via [facility] assisted pt with his medications today for refills and filling his pill box.   Objective:   Review of Systems  All other systems reviewed and are negative.   Physical Exam  Constitutional: He is oriented to person, place, and time. He appears well-developed and well-nourished.  HENT:  Right Ear: External ear normal.  Left Ear: External ear normal.  Neck: Normal range of motion.  Cardiovascular: Normal heart sounds.   Respiratory: Effort normal and breath sounds normal.  GI: Soft. Bowel sounds are normal.  Musculoskeletal: Normal range of motion.  Neurological: He is alert and oriented to person, place, and time.  Skin: Skin is warm and dry.  Note some discoloration to the left lower leg.  Psychiatric: He has a normal mood and affect. His behavior is normal. Judgment and thought content normal.    Current Medications:   Current Outpatient Prescriptions  Medication Sig Dispense Refill  . albuterol (PROVENTIL HFA;VENTOLIN HFA) 108 (90 BASE) MCG/ACT inhaler Inhale 2 puffs into the lungs every 6 (six) hours as needed for wheezing. For shortness of breath    . aspirin 325 MG tablet Take 1 tablet by mouth daily. AS NEEDED  FOR HEADACHE AT BEDTIME    . aspirin EC 81 MG EC tablet Take 1 tablet (81 mg total) by mouth daily.    Marland Kitchen. atenolol (TENORMIN) 25 MG tablet Take 1 tablet (25 mg total) by mouth every morning. 90 tablet 3  . benzonatate (TESSALON) 100 MG capsule Take 1 capsule (100 mg total) by mouth 3 (three) times daily as needed for cough. 60 capsule 0  . clopidogrel (PLAVIX) 75 MG tablet Take 1 tablet (75 mg total) by mouth daily with breakfast. 90 tablet 3  . diphenhydrAMINE (BENADRYL) 25 MG tablet Take 25 mg by mouth every 6 (six) hours as needed.    . finasteride (PROSCAR) 5 MG tablet Take 5 mg by mouth daily.    . furosemide (LASIX) 20 MG tablet Take 1 tablet (20 mg total) by mouth 2 (two) times daily. 180 tablet 3  . guaiFENesin (MUCINEX) 600 MG 12 hr tablet Take 2 tablets (1,200 mg total) by mouth 2 (two) times daily. 30 tablet 0  . mometasone-formoterol (DULERA) 100-5 MCG/ACT AERO Inhale 2 puffs into the lungs 2 (two) times daily. 1 Inhaler 6  . Multiple Vitamins-Minerals (HEALTHY EYES) TABS Take 1 tablet by mouth daily.    . nitroGLYCERIN (NITROSTAT) 0.4 MG SL tablet Place 0.4 mg under the tongue every 5 (five) minutes as needed. FOR CHEST PAIN    . nystatin (MYCOSTATIN) 100000 UNIT/ML suspension Take 5 mLs by mouth 2 (two) times daily as needed.    .Marland Kitchen  sodium chloride (OCEAN) 0.65 % SOLN nasal spray Place 1 spray into both nostrils as needed for congestion.    . tamsulosin (FLOMAX) 0.4 MG CAPS capsule Take 0.4 mg by mouth daily after breakfast.      No current facility-administered medications for this visit.    Functional Status:   In your present state of health, do you have any difficulty performing the following activities: 02/15/2015 01/31/2015  Hearing? N N  Vision? N N  Difficulty concentrating or making decisions? N N  Walking or climbing stairs? N N  Dressing or bathing? N N  Doing errands, shopping? N N  Preparing Food and eating ? N -  Using the Toilet? N -  In the past six months, have  you accidently leaked urine? N -  Do you have problems with loss of bowel control? N -  Managing your Medications? N -  Managing your Finances? N -  Housekeeping or managing your Housekeeping? N -    Fall/Depression Screening:    PHQ 2/9 Scores 02/15/2015  PHQ - 2 Score 0   Filed Vitals:   04/06/15 1107  BP: 120/60  Pulse: 64  Resp: 20    Assessment:   Ongoing case management related to COPD Home O2 Follow up on medication adherence related to prescribed medications.   Plan:  Physical assessment completed with no acute issues noted today. Will reiterate on the COPD action plan and inquired on pt's increase knowledge on what to do if acute symptoms should occur. Will verify pt is able to perform teach back method on reciting some of the signs and symptoms of COPD. Will strongly encouraged pt to wear his home oxygen to prevent COPD exacerbation episodes.  Strongly encouraged pt to wear his home O2 as prescribed to prevent COPD flare ups (exacerbation episodes).  Will strongly encouraged pt to take all her daily medications. Dawn Pettus (pharmacy via [facility]) present today and filled pt's pill box for ongoing adherence. Verified pt with understanding of all his medications and has enough refills. Plan of care discussed along with goals reviewed and extended to allow pt's adherence. Due to pt's progress over the last few months pt making good progress in managing his care. RN will discuss weaning pt from the community home visit to possible health coach for ongoing follow up telephonically.  Elliot Cousin, RN Care Management Coordinator [facility] Main Office [phone]

## 2015-04-26 ENCOUNTER — Ambulatory Visit: Payer: Self-pay | Admitting: *Deleted

## 2015-04-26 ENCOUNTER — Other Ambulatory Visit: Payer: Self-pay | Admitting: *Deleted

## 2015-04-26 NOTE — Patient Outreach (Signed)
[facility]) Care Management  04/26/2015  Mitchell [name] 03-Mar-1941 [id number]  RN attempted to reach pt twice this morning at 9 and 11am however pt not available. RN also received a call from Steve Rattlerawn Pettus PhD [facility] who was on site at the pt's home however unsuccessful with the home visit. RN has left two HIPAA approved voice messages with the calls today and will continue outreach accordingly. Note today's plans were to discharge this pt who has been successful in his progression in managing his care independently. Will verify accordingly and review plan of care and goals prior to closure of this case once in contact with this pt.  Elliot CousinLisa Darell Saputo, RN Care Management Coordinator [facility] Main Office [phone]

## 2015-04-26 NOTE — Patient Outreach (Signed)
[facility]) Care Management  04/26/2015  Mitchell BossierJarald Dean [name] 1940-11-01 [id number]   RN received a call from pt today who indicated his had a event last week with this breathing and EMS was called but he refused to go to the ED. Pt also indicated he has called his provider each day but unsuccessful with getting the assistance he needed. Pt states he is taking all her medications as prescribed and today he is requesting assistance expecting a home visit at 1130 however the appointments were arranged at 1100 with RN and pharmacy who visited pt on the site at that time. This RN has been calling pt this morning unsuccessfully however left messages. When pt returned the call he was upset and aburpt with is conversation and started discussing several issues interchanging topics (medications, EMS visiting last week, neeed assistance to get to [facility] as he called the office in the complex were he lives, caregiver visiting this past week to assist with his care and the need for a different type of nebulizer recommended by this caregiver). RN attempted to gather more information concerning his event and how he was doing today however pt short and mentioned that RN has discussed on the last home visit possible discharge today and pt felt he was being left to manage his care alone with no assistance. RN attempted to explain to pt that it was a discussion however if he has other issues related to his COPD or medications he does not have to be discharged as planned. Pt adamant about no longer [facility] however RN strongly encouraged pt to reconsider and encouraged pt to at least talk with Steve Rattlerawn Pettus, PhD who was called by this RN and updated on this pt's current concerns.  Pt ended the call.     RN called the pt back and attempt to defuse pt's understanding and slow explain the above information mention and offered to assist further. RN mentioned the pt's progress as of last home visit which has  improved as pt agreed however with the last incident pt felt he is now behind on his progress. RN offered once again to assist as pt declined. RN strongly encouraged pt to except the call from Steve Rattlerawn Pettus, PhD to assist further with any medications issues however no response as pt again ended the call.  RN will attempt to follow up once again with this pt later in this week to allow pharmacy to assist and perhaps pt will be more receptive to case manager for update on his status related to COPD and medication issues.    RN contacted  Dr. Tenny Crawoss spoke with Tammy with the above update and request office to contact pt an inquire further on pt's possible needs or needed appointment if pt is receptive to possible intervention at this time. RN has left my contact with the provider if [facility]. Office informed RN will attempt to make contact with this pt once again in a few days to inquire further on pt's needs. Will follow up accordingly.  Elliot CousinLisa Huldah Marin, RN Care Management Coordinator [facility] Main Office [phone]

## 2015-05-03 ENCOUNTER — Other Ambulatory Visit: Payer: Self-pay

## 2015-05-03 NOTE — Patient Outreach (Signed)
I called Mr. Scheer to determine how he was doing with his medications.  He was stated he was not feeling well.  I asked what was going on.  He stated his head was hurting and his was sleeping a lot.  He did not have much of an appetite.  I asked if he had called Dr. Tenny Craw.  He stated he did not.  I encouraged him to call Dr. Tenny Craw to set up an appointment to be assessed especially if he did not feel well enough to get out of bed and if he was not eating.  I stressed that if he felt like it was moving to his lungs and it was becoming an infection that was effecting is breathing he needed to see Dr. Tenny Craw or his pulmonologist.  He stated he may.  He did not seem very interested in calling the doctor.  I stressed the importance again in seeking care if he does not feel well.  I asked how he was doing with taking his medications.  He stated it was a long story and he really did not want to talk about it right now.  He stated he was taking two of his medications, his blood thinner (clopidogrel) and his fluid pill (furosemide).  He stated the others were making him not feel well.  I asked for him to talk it about it more and explain what he meant.  He stated he did not want to talk about it and for me to call him back tomorrow.  I again stressed the importance of taking all of his medications and as I was doing this he hung up on me.  I will attempt to call him back tomorrow.   Steve Rattler, PharmD, [employer] [facility] [employer] consultant [phone]

## 2015-05-04 ENCOUNTER — Other Ambulatory Visit: Payer: Self-pay

## 2015-05-04 NOTE — Patient Outreach (Signed)
I called Mitchell [name] to see how he was doing this morning and he stated he was doing better.  He stated he was able to get a full night of sleep.  He stated he was no longer using his oxygen and he was only using it for emergency.  He also state he was only taking atenolol, clopidogrel, furosemide, and aspirin.  The other medications were making him sleep all the time and making him feel sick so he was not going to take them any more.  He stated Dr. Charlott Rakes office was aware.  I stated that he needed to take his medications are prescribed and to talk to Dr. Tenny Craw about his medications be stopping all of them.  I asked if he had an appointment with him.  He stated he had one in July 2017.  I told him I was going to follow up with Dr. Charlott Rakes office and he stated that Dr. Tenny Craw already knew.  I asked if I could call him in a couple of week to see if he changed his mind about his medications and he said that would be fine and hung up.  I will call in a few weeks to see if he has changed his mind about being adherent to his medications.   Steve Rattler, PharmD, [employer] [employer] consultant [phone]

## 2015-05-04 NOTE — Patient Outreach (Signed)
I received a return phone call from [facility], Dr. Charlott Rakes nurse, in regards to Mr. Bochenek.  I told her the medications Mr. Hurlbut was taking and that he had stopped all the other medications and that he was no longer using his oxygen.  She stated he would let Dr. Tenny Craw know.    Steve Rattler, PharmD, [employer] [employer] consultant [phone]

## 2015-05-06 ENCOUNTER — Other Ambulatory Visit: Payer: Self-pay | Admitting: *Deleted

## 2015-05-06 DIAGNOSIS — J441 Chronic obstructive pulmonary disease with (acute) exacerbation: Secondary | ICD-10-CM

## 2015-05-06 NOTE — Patient Outreach (Addendum)
[facility]) Care Management  05/06/2015  Mitchell [name] 1941/04/05 [id number]  Telephone Assessment   RN spoke with pt concerning [facility] services as pt appreciative for services provided. RN continued to offered [facility] services concerning his COPD and management of care however pt very talkative and recited all his has learned since in conception with [facility] services from the correct way to breathing using pulse-lip breathing and how to bundle up in the cold weather to avoid getting sick.  Pt able to also mention how he was taking his medication in the beginning and how he is managing his medications at this point (pharmacy still working with pt). Pt reports having mouth and throat irritations and recites how he was instructed by this RN when taking his antibiotic in the past to "swash and swallow". Pt indicated he will consult with with doctor on an upcoming appointment (Dr. Harrington Challenger) concerning his throat irritations that has reoccurred. Confirmed pt remains in the GREEN zone on the COPD action plan with no distress today however pt mentioned he is attempted to wean his home O2 as RN has strongly encourage pt to apply his home O2 as instructed by his provider to avoid SOB and exertion with his daily activities ( pt with understanding). Pt very upset due to [facility] services coming to a close and a long conversation took place today explained that this is a short-tem services with a plan of care in place and workable goals which have been met however if pt felt other issues needde to be address for extending [facility] community case management regarding community visits to please present at this time however pt had no issues to present that would require ongoing community case management. RN offered ongoing telephonic services with a health coach as pt very receptive with a referral. RN inquired on any other needed of service at this time with none mentioned. Based upon today's discussed of pt's progress as  indicated by this pt and in view all that the pt has accomplished while under the care of [facility] RN praised pt for his progress and continued to encouraged adherence. Pt has accepted ongoing telephonic consults with the Health coach from [facility]. RN will update Dr. Harrington Challenger accordingly with [facility] disposition.   Raina Mina, RN Care Management Coordinator [place] Office [phone]

## 2015-05-16 ENCOUNTER — Other Ambulatory Visit: Payer: Self-pay

## 2015-05-16 NOTE — Patient Outreach (Signed)
[facility]) Care Management  05/16/2015  Jamani Bearce 08-21-1940 [id number]  Telephone call to patient for health coach introductory call.  Patient reports that he is pushing along.  He reports that his breathing is doing ok today.  Explained to patient health coach role.  He verbalized understanding.  No concerns voiced from patient.     Plan: RN Health Coach will contact patient within one month and patient agrees to next outreach.   RN Health Coach will send patient introductory letter.     Bary Leriche, RN, MSN [facility] Care Management RN Telephonic Health Coach [phone]

## 2015-05-17 ENCOUNTER — Encounter: Payer: Self-pay | Admitting: *Deleted

## 2015-06-02 ENCOUNTER — Other Ambulatory Visit: Payer: Self-pay

## 2015-06-02 NOTE — Patient Outreach (Signed)
I called Mr. Schirmer to follow up on how he was doing on taking his medications.  He answered me with just "doing well" and "yup".  I told him I was going to close him out to pharmacy since he was doing well and that he was being followed by a Nurse Health Coach and that she was going to call him later this month.  He said "yes" and hung up on me.  I am closing him to pharmacy.  I will let Dionne know and I will let his provider know as well.  I am happy to assist in the future if any other pharmacy issues arise.   Steve Rattler, PharmD, [employer] [employer] consultant [phone]

## 2015-06-06 ENCOUNTER — Other Ambulatory Visit: Payer: Self-pay

## 2015-06-06 NOTE — Patient Outreach (Signed)
[facility]) Care Management  [facility] Care Manager  06/06/2015   Kristan Votta 01/06/1941 [id number]  Subjective: Telephone call to patient for initial assessment.  Patient very hurried and states he has things to do.  Patient states that his breathing is doing ok but he gets tired and it takes him longer to do things but he gets them done. Patient expresses that he would benefit from help with cleaning and other things but he states he does not qualify.  He also states he mentioned it to his physician but he still does not have any help. He states that his sons do not visit and that he does most things on his own.  Discussed with patient COPD and COPD action plan and using it as a guide to his daily activities. He verbalized understanding and states he has to go.      Objective:   Current Medications:  Current Outpatient Prescriptions  Medication Sig Dispense Refill  . albuterol (PROVENTIL HFA;VENTOLIN HFA) 108 (90 BASE) MCG/ACT inhaler Inhale 2 puffs into the lungs every 6 (six) hours as needed for wheezing. Reported on 05/04/2015    . aspirin 325 MG tablet Take 1 tablet by mouth daily. Reported on 04/06/2015    . atenolol (TENORMIN) 25 MG tablet Take 1 tablet (25 mg total) by mouth every morning. 90 tablet 3  . clopidogrel (PLAVIX) 75 MG tablet Take 1 tablet (75 mg total) by mouth daily with breakfast. 90 tablet 3  . diphenhydrAMINE (BENADRYL) 25 MG tablet Take 25 mg by mouth every 6 (six) hours as needed. Reported on 05/04/2015    . finasteride (PROSCAR) 5 MG tablet Take 5 mg by mouth daily. Reported on 05/04/2015    . furosemide (LASIX) 20 MG tablet Take 1 tablet (20 mg total) by mouth 2 (two) times daily. 180 tablet 3  . metolazone (ZAROXOLYN) 2.5 MG tablet Take 2.5 mg by mouth. Reported on 05/04/2015    . Multiple Vitamins-Minerals (HEALTHY EYES) TABS Take 1 tablet by mouth daily. Reported on 05/04/2015    . nitroGLYCERIN (NITROSTAT) 0.4 MG SL tablet Place 0.4 mg under  the tongue every 5 (five) minutes as needed. Reported on 05/04/2015    . nystatin (MYCOSTATIN) 100000 UNIT/ML suspension Take 5 mLs by mouth 2 (two) times daily as needed. Reported on 05/04/2015    . sodium chloride (OCEAN) 0.65 % SOLN nasal spray Place 1 spray into both nostrils as needed for congestion. Reported on 05/04/2015    . tamsulosin (FLOMAX) 0.4 MG CAPS capsule Take 0.4 mg by mouth daily after breakfast. Reported on 05/04/2015    . aspirin EC 81 MG EC tablet Take 1 tablet (81 mg total) by mouth daily. (Patient not taking: Reported on 05/04/2015)    . benzonatate (TESSALON) 100 MG capsule Take 1 capsule (100 mg total) by mouth 3 (three) times daily as needed for cough. (Patient not taking: Reported on 05/04/2015) 60 capsule 0  . guaiFENesin (MUCINEX) 600 MG 12 hr tablet Take 2 tablets (1,200 mg total) by mouth 2 (two) times daily. (Patient not taking: Reported on 05/04/2015) 30 tablet 0  . mometasone-formoterol (DULERA) 100-5 MCG/ACT AERO Inhale 2 puffs into the lungs 2 (two) times daily. (Patient not taking: Reported on 05/04/2015) 1 Inhaler 6   No current facility-administered medications for this visit.    Functional Status:  In your present state of health, do you have any difficulty performing the following activities: 06/06/2015 02/15/2015  Hearing? N N  Vision? N  N  Difficulty concentrating or making decisions? N N  Walking or climbing stairs? N N  Dressing or bathing? N N  Doing errands, shopping? N N  Preparing Food and eating ? N N  Using the Toilet? N N  In the past six months, have you accidently leaked urine? N N  Do you have problems with loss of bowel control? N N  Managing your Medications? N N  Managing your Finances? N N  Housekeeping or managing your Housekeeping? N N    Fall/Depression Screening: PHQ 2/9 Scores 06/06/2015 02/15/2015  PHQ - 2 Score 0 0    Assessment: Patient will benefit from health coach outreach for disease management and support.   Plan:  [facility]  CM Care Plan Problem Three        Most Recent Value   Care Plan Problem Three   Lack of knowledge with COPD managemnet   Role Documenting the Problem Three  Health Coach   Care Plan for Problem Three  Active   THN Long Term Goal (31-90) days  Pt will be able to acknowledge COPD action plan within the next 90 days.   THN Long Term Goal Start Date  06/06/15   Interventions for Problem Three Long Term Goal  RN Health coach reviewed with patient COPD action plan and when to notify physician.       RN Health Coach will provide ongoing education for patient on COPD through phone calls and sending printed information to patient for further discussion.  RN Health Coach sent welcome letter to patient.  RN Health Coach will send initial barriers letter, assessment, and care plan to primary care physician.  RN Health Coach will contact patient within one month and patient agrees to next contact.   Bary Leriche, RN, MSN [facility] Care Management RN Telephonic Health Coach [phone]

## 2015-07-01 ENCOUNTER — Other Ambulatory Visit: Payer: Self-pay

## 2015-07-01 NOTE — Patient Outreach (Signed)
[facility]) Care Management  [facility] Care Manager  07/01/2015   Mitchell [name] 1940/04/25 [id number]  Subjective: Telephone call to patient for monthly call.  Patient reports he is doing good.  Patient reports using breathing medications about twice a day.  Discussed with patient the COPD action plan and importance of using action plan as a guide to daily maintenance.  He verbalized understanding. No concerns.   Objective:   Current Medications:  Current Outpatient Prescriptions  Medication Sig Dispense Refill  . albuterol (PROVENTIL HFA;VENTOLIN HFA) 108 (90 BASE) MCG/ACT inhaler Inhale 2 puffs into the lungs every 6 (six) hours as needed for wheezing. Reported on 05/04/2015    . aspirin 325 MG tablet Take 1 tablet by mouth daily. Reported on 04/06/2015    . atenolol (TENORMIN) 25 MG tablet Take 1 tablet (25 mg total) by mouth every morning. 90 tablet 3  . clopidogrel (PLAVIX) 75 MG tablet Take 1 tablet (75 mg total) by mouth daily with breakfast. 90 tablet 3  . diphenhydrAMINE (BENADRYL) 25 MG tablet Take 25 mg by mouth every 6 (six) hours as needed. Reported on 05/04/2015    . finasteride (PROSCAR) 5 MG tablet Take 5 mg by mouth daily. Reported on 05/04/2015    . furosemide (LASIX) 20 MG tablet Take 1 tablet (20 mg total) by mouth 2 (two) times daily. 180 tablet 3  . metolazone (ZAROXOLYN) 2.5 MG tablet Take 2.5 mg by mouth. Reported on 05/04/2015    . Multiple Vitamins-Minerals (HEALTHY EYES) TABS Take 1 tablet by mouth daily. Reported on 05/04/2015    . nitroGLYCERIN (NITROSTAT) 0.4 MG SL tablet Place 0.4 mg under the tongue every 5 (five) minutes as needed. Reported on 05/04/2015    . nystatin (MYCOSTATIN) 100000 UNIT/ML suspension Take 5 mLs by mouth 2 (two) times daily as needed. Reported on 05/04/2015    . sodium chloride (OCEAN) 0.65 % SOLN nasal spray Place 1 spray into both nostrils as needed for congestion. Reported on 05/04/2015    . tamsulosin (FLOMAX) 0.4 MG CAPS  capsule Take 0.4 mg by mouth daily after breakfast. Reported on 05/04/2015    . aspirin EC 81 MG EC tablet Take 1 tablet (81 mg total) by mouth daily. (Patient not taking: Reported on 05/04/2015)    . benzonatate (TESSALON) 100 MG capsule Take 1 capsule (100 mg total) by mouth 3 (three) times daily as needed for cough. (Patient not taking: Reported on 05/04/2015) 60 capsule 0  . guaiFENesin (MUCINEX) 600 MG 12 hr tablet Take 2 tablets (1,200 mg total) by mouth 2 (two) times daily. (Patient not taking: Reported on 05/04/2015) 30 tablet 0  . mometasone-formoterol (DULERA) 100-5 MCG/ACT AERO Inhale 2 puffs into the lungs 2 (two) times daily. (Patient not taking: Reported on 05/04/2015) 1 Inhaler 6   No current facility-administered medications for this visit.    Functional Status:  In your present state of health, do you have any difficulty performing the following activities: 06/06/2015 02/15/2015  Hearing? N N  Vision? N N  Difficulty concentrating or making decisions? N N  Walking or climbing stairs? N N  Dressing or bathing? N N  Doing errands, shopping? N N  Preparing Food and eating ? N N  Using the Toilet? N N  In the past six months, have you accidently leaked urine? N N  Do you have problems with loss of bowel control? N N  Managing your Medications? N N  Managing your Finances? N N  Housekeeping or managing your Housekeeping? N N    Fall/Depression Screening: PHQ 2/9 Scores 07/01/2015 06/06/2015 02/15/2015  PHQ - 2 Score 0 0 0    Assessment: Patient continues to benefit from health coach outreach for disease management and support.    Plan:  [facility] Care [facility] Problem [facility]        Most Recent Value   Care Plan Problem Three   Lack of knowledge with COPD managemnet   Role Documenting the Problem Three  Health Coach   Care Plan for Problem Three  Active   THN Long Term Goal (31-90) days  Pt will be able to acknowledge COPD action plan within the next 90 days.   THN Long Term Goal  Start Date  07/01/15   Interventions for Problem Three Long Term Goal  RN Health coach reinforced with patient COPD action plan and when to notify physician.       RN Health Coach will contact patient within one month and patient agrees to next outreach.    Mitchell Lericheionne J Esterlene Atiyeh, RN, MSN [facility] Care Management RN Telephonic Health Coach [phone]

## 2015-07-07 ENCOUNTER — Ambulatory Visit: Payer: Commercial Managed Care - HMO | Admitting: Pulmonary Disease

## 2015-07-13 ENCOUNTER — Ambulatory Visit: Payer: Commercial Managed Care - HMO | Admitting: Pulmonary Disease

## 2015-07-27 ENCOUNTER — Ambulatory Visit: Payer: Self-pay

## 2015-07-27 ENCOUNTER — Other Ambulatory Visit: Payer: Self-pay

## 2015-07-27 NOTE — Patient Outreach (Signed)
[facility]N) Care Management  07/27/2015  Allie BossierJarald Dean Krammes October 27, 1940 [id number]   Telephone call to patient for monthly outreach.  No answer. HIPAA compliant voice message left.    Plan: RN Health Coach will contact patient within 1-2 weeks.   Bary Lericheionne J Maziah Smola, RN, MSN [facility] Care Management RN Telephonic Health Coach [phone]

## 2015-08-09 ENCOUNTER — Other Ambulatory Visit: Payer: Self-pay

## 2015-08-09 NOTE — Patient Outreach (Signed)
[facility]) Care Management  08/09/2015  Mitchell BossierJarald Dean [name] 03/13/1941 [id number]   [facility] office.  Advised on patient report of foot pain. She reports that Mitchell [name] has spoken with patient according to note and that it was agreed that if pain does not get better that patient to notify the office on tomorrow. Advised her that patient was looking for return call.  She states she will let Mitchell [name] know and have her call patient.    Bary Lericheionne J Ashyr Hedgepath, RN, MSN [facility] Care Management RN Telephonic Health Coach [phone]

## 2015-08-09 NOTE — Patient Outreach (Signed)
[facility]N) Care Management  08/09/2015  Mitchell [name] 09-01-1940 [id number]   Incoming call from Noreene LarssonJill at Dr. Charlott Rakesoss's office.  She states that Dr. Tenny Crawoss is ordering colchicine 0.6mg  take two tablets now and 1 tablet an hour later.  She states that if pain no better tell patient to go to the emergency room.  Noreene LarssonJill states she will call to pharmacy patient has on file. [facility] [address]  Advised I would call patient to let him know.    Telephone call to patient to give new information from the physician. Patient very abrupt and asked why health coach was calling again.  Advised him on order for colchicine and to go to the emergency room if no better.  He verbalized understanding. Patient states he does not use [facility] any more and that he uses [facility] [address].  Advised him I would call the office back to let them know of information.  Asked patient if he had any questions he states no.  Advised patient to call if any questions or concerns.  He verbalized understanding.    Telephone call back to [place].  Advised on change in patient pharmacy. She states she will note that change as they did not have that information.    Bary Lericheionne J Shamyah Stantz, RN, MSN [facility] Care Management RN Telephonic Health Coach [phone]

## 2015-08-09 NOTE — Patient Outreach (Addendum)
[facility](THN) Care Management  [facility]  08/09/2015   Mitchell [name] [birth year] [id number]  Subjective: Telephone call to patient for monthly call.  Patient states he is soaking his left foot as he awaked this morning in pain.  Patient rates pain at 10/10.  He states he has called the office this morning and waiting for follow up. Patient denies any redness, swelling, or wound to foot. Advised patient that health coach would call the office to follow up.  He verbalized understanding. Patient reports his breathing has been "middle of the road".  Denies any difficulties in managing his breathing.  Discussed with patient COPD action plan and when to notify physician.  He verbalized understanding.    Objective:   Encounter Medications:  Outpatient Encounter Prescriptions as of 08/09/2015  Medication Sig Note  . albuterol (PROVENTIL HFA;VENTOLIN HFA) 108 (90 BASE) MCG/ACT inhaler Inhale 2 puffs into the lungs every 6 (six) hours as needed for wheezing. Reported on 05/04/2015   . aspirin 325 MG tablet Take 1 tablet by mouth daily. Reported on 04/06/2015 02/07/2015: Received from: External Pharmacy Received Sig:   . aspirin EC 81 MG EC tablet Take 1 tablet (81 mg total) by mouth daily. (Patient not taking: Reported on 05/04/2015)   . atenolol (TENORMIN) 25 MG tablet Take 1 tablet (25 mg total) by mouth every morning.   . benzonatate (TESSALON) 100 MG capsule Take 1 capsule (100 mg total) by mouth 3 (three) times daily as needed for cough. (Patient not taking: Reported on 05/04/2015)   . clopidogrel (PLAVIX) 75 MG tablet Take 1 tablet (75 mg total) by mouth daily with breakfast.   . diphenhydrAMINE (BENADRYL) 25 MG tablet Take 25 mg by mouth every 6 (six) hours as needed. Reported on 05/04/2015   . finasteride (PROSCAR) 5 MG tablet Take 5 mg by mouth daily. Reported on 05/04/2015   . furosemide (LASIX) 20 MG tablet Take 1 tablet (20 mg total) by mouth 2 (two) times daily.   Marland Kitchen.  guaiFENesin (MUCINEX) 600 MG 12 hr tablet Take 2 tablets (1,200 mg total) by mouth 2 (two) times daily. (Patient not taking: Reported on 05/04/2015)   . metolazone (ZAROXOLYN) 2.5 MG tablet Take 2.5 mg by mouth. Reported on 05/04/2015   . mometasone-formoterol (DULERA) 100-5 MCG/ACT AERO Inhale 2 puffs into the lungs 2 (two) times daily. (Patient not taking: Reported on 05/04/2015)   . Multiple Vitamins-Minerals (HEALTHY EYES) TABS Take 1 tablet by mouth daily. Reported on 05/04/2015   . nitroGLYCERIN (NITROSTAT) 0.4 MG SL tablet Place 0.4 mg under the tongue every 5 (five) minutes as needed. Reported on 05/04/2015 03/07/2015: Has not needed  . nystatin (MYCOSTATIN) 100000 UNIT/ML suspension Take 5 mLs by mouth 2 (two) times daily as needed. Reported on 05/04/2015   . sodium chloride (OCEAN) 0.65 % SOLN nasal spray Place 1 spray into both nostrils as needed for congestion. Reported on 05/04/2015   . tamsulosin (FLOMAX) 0.4 MG CAPS capsule Take 0.4 mg by mouth daily after breakfast. Reported on 05/04/2015    No facility-administered encounter medications on file as of 08/09/2015.    Functional Status:  In your present state of health, do you have any difficulty performing the following activities: 06/06/2015 02/15/2015  Hearing? N N  Vision? N N  Difficulty concentrating or making decisions? N N  Walking or climbing stairs? N N  Dressing or bathing? N N  Doing errands, shopping? N N  Preparing Food and eating ? N  N  Using the Toilet? N N  In the past six months, have you accidently leaked urine? N N  Do you have problems with loss of bowel control? N N  Managing your Medications? N N  Managing your Finances? N N  Housekeeping or managing your Housekeeping? N N    Fall/Depression Screening: PHQ 2/9 Scores 08/09/2015 07/01/2015 06/06/2015 02/15/2015  PHQ - 2 Score 0 0 0 0    Assessment: Patient continues to benefit from health coach outreach for disease management and support.  Plan:  [facility] CM Care  Plan Problem Three        Most Recent Value   Care Plan Problem Three   Lack of knowledge with COPD managemnet   Role Documenting the Problem Three  Health Coach   Care Plan for Problem Three  Active   THN Long Term Goal (31-90) days  Pt will be able to acknowledge COPD action plan within the next 90 days.   THN Long Term Goal Start Date  07/01/15   Interventions for Problem Three Long Term Goal  RN Health coach reviewed with patient COPD action plan and when to notify physician.       RN Health Coach will contact patient within one month and patient agrees to next outreach.   RN Health Coach called Dr. Charlott Rakes office left message for return call.   Bary Leriche, RN, MSN [facility] Care Management RN Telephonic Health Coach [phone]

## 2015-09-02 ENCOUNTER — Other Ambulatory Visit: Payer: Self-pay

## 2015-09-02 NOTE — Patient Outreach (Signed)
[facility](THN) Care Management  09/02/2015  Mitchell BossierJarald Dean [name] [birth year] [id number]   Telephone call to patient for monthly call. Patient states he does not have much time.  Patient reluctant to verify HIPAA but does verify. Patient jumps into how does he know who I am and that I could be scamming him. He asked how do I know who he is by just verifying his date of birth and address.  Explained to patient that was our process.  Patient states he does not like that and does not want to do this.  Advised patient that the program was totally voluntary and he could stop the program at any time.  Patient raising his voice states he does not want to do that.  He mentioned possibly having a code from his physician that we both know.  Patient states he will call his physician to express his concerns. Advised him that health coach would also reach out to physician. He verbalized understanding.   Telephone call to Dr. Charlott Rakesoss's office to speak with the nurse. Message left for return call.   12:37 pm  Return call from [place] at Dr. Charlott Rakesoss's office. Explained the above and asked them to reach out to patient to possibly establish a code with patient that we know in order for him to feel more comfortable. She states she will give him a call.    Plan: RN Health Coach will wait return call from physician office. If no return call will attempt patient again in 1-2 weeks.   Bary Lericheionne J Kati Riggenbach, RN, MSN [facility] Care Management RN Telephonic Health Coach [phone]

## 2015-09-06 ENCOUNTER — Other Ambulatory Visit: Payer: Self-pay

## 2015-09-06 NOTE — Patient Outreach (Signed)
[facility](THN) Care Management  09/06/2015  Mitchell BossierJarald Dean [name] 1940/07/07 [id number]   Incoming call from Noreene LarssonJill at Dr. Charlott Rakesoss's office.  She states that she has talked with Dr. Tenny Crawoss and patient mutual decision to close patient case [facility] if patient has issues with telephone safety.   She thanked [facility] care management for services.    Plan: RN Health Coach will send closure letter to patient and physician.   RN health Coach will notify care management assistant to close case.    Bary Lericheionne J Lanita Stammen, RN, MSN [facility] Care Management RN Telephonic Health Coach [phone]

## 2015-09-28 ENCOUNTER — Ambulatory Visit: Payer: Commercial Managed Care - HMO

## 2015-11-03 ENCOUNTER — Encounter: Payer: Self-pay | Admitting: *Deleted

## 2015-11-10 ENCOUNTER — Telehealth: Payer: Self-pay | Admitting: Interventional Cardiology

## 2015-11-10 NOTE — Telephone Encounter (Signed)
New Message:    He wants to make sure that Dr Katrinka Blazing know this and to pass it on to the right person.He does not want any more automated phone call reminding him of an appointment.His reminders  started last Friday and today he is still getting reminders .He have had 4 different reminders,very frustrating.He said he had been on the phone a total of 27 minutes to complain about this. It takes up too much of his time,once he says he is coming,that should stop the other reminders.

## 2015-11-14 ENCOUNTER — Ambulatory Visit (INDEPENDENT_AMBULATORY_CARE_PROVIDER_SITE_OTHER): Payer: Commercial Managed Care - HMO | Admitting: Interventional Cardiology

## 2015-11-14 ENCOUNTER — Encounter: Payer: Self-pay | Admitting: Interventional Cardiology

## 2015-11-14 VITALS — BP 124/60 | HR 64 | Ht 72.0 in | Wt 212.0 lb

## 2015-11-14 DIAGNOSIS — E785 Hyperlipidemia, unspecified: Secondary | ICD-10-CM

## 2015-11-14 DIAGNOSIS — I10 Essential (primary) hypertension: Secondary | ICD-10-CM

## 2015-11-14 DIAGNOSIS — I2510 Atherosclerotic heart disease of native coronary artery without angina pectoris: Secondary | ICD-10-CM | POA: Diagnosis not present

## 2015-11-14 DIAGNOSIS — J441 Chronic obstructive pulmonary disease with (acute) exacerbation: Secondary | ICD-10-CM | POA: Diagnosis not present

## 2015-11-14 NOTE — Progress Notes (Signed)
Cardiology Office Note    Date:  11/14/2015   ID:  Mitchell [name], DOB 1941/02/25, MRN [id number]  PCP:   Duane Lope, MD  Cardiologist: Lesleigh Noe, MD   Chief Complaint  Patient presents with  . Coronary Artery Disease    History of Present Illness:  Mitchell [name] is a 75 y.o. male coronary artery disease, COPD, claudication, history of LAD stent, and hypertension.  Mitchell [name] is doing relatively well. He is limited by dyspnea and leg discomfort. He denies angina. Has lost 30 pounds. No nitroglycerin use. Overall he feels well.  He is irritated that I ambulate getting into his room.  Past Medical History:  Diagnosis Date  . Anginal pain (HCC) 01/2012  . Anxiety   . Atherosclerotic heart disease of native coronary artery without angina pectoris   . BPH (benign prostatic hypertrophy)   . CAD (coronary artery disease) 2007   with LAD/Diag CBPTCA and kissing   . COPD (chronic obstructive pulmonary disease) (HCC)    "maybe" (02/14/2012)  . Coronary artery disease   . Depression   . GERD (gastroesophageal reflux disease)   . Hyperlipidemia   . Hypertension   . Hypoxemia   . Myocardial infarction (HCC) 01/22/2012   NSTEMI with DES to RSA (PDA)  . Shortness of breath    "walking and laying down sometimes" (02/14/2012)    Past Surgical History:  Procedure Laterality Date  . CATARACT EXTRACTION W/ INTRAOCULAR LENS  IMPLANT, BILATERAL  ~ 2010  . CORONARY ANGIOPLASTY WITH STENT PLACEMENT  05/11/2005   PCI OF LAD  . GANGLION CYST REMOVED  1956?   right  . LEFT HEART CATHETERIZATION WITH CORONARY ANGIOGRAM Bilateral 01/24/2012   Procedure: LEFT HEART CATHETERIZATION WITH CORONARY ANGIOGRAM;  Surgeon: Lesleigh Noe, MD;  Location: [facility] CATH LAB;  Service: Cardiovascular;  Laterality: Bilateral;  . REFRACTIVE SURGERY  2010   left    Current Medications: Outpatient Medications Prior to Visit  Medication Sig Dispense Refill  . aspirin EC 81 MG EC tablet Take 1  tablet (81 mg total) by mouth daily.    Marland Kitchen atenolol (TENORMIN) 25 MG tablet Take 1 tablet (25 mg total) by mouth every morning. 90 tablet 3  . clopidogrel (PLAVIX) 75 MG tablet Take 1 tablet (75 mg total) by mouth daily with breakfast. 90 tablet 3  . finasteride (PROSCAR) 5 MG tablet Take 5 mg by mouth daily. Reported on 05/04/2015    . metolazone (ZAROXOLYN) 2.5 MG tablet Take 2.5 mg by mouth 3 (three) times a week. Before taking Tuesday, Thursday, Saturday    . tamsulosin (FLOMAX) 0.4 MG CAPS capsule Take 0.4 mg by mouth daily after breakfast. Reported on 05/04/2015    . albuterol (PROVENTIL HFA;VENTOLIN HFA) 108 (90 BASE) MCG/ACT inhaler Inhale 2 puffs into the lungs every 6 (six) hours as needed for wheezing. Reported on 05/04/2015    . mometasone-formoterol (DULERA) 100-5 MCG/ACT AERO Inhale 2 puffs into the lungs 2 (two) times daily. (Patient not taking: Reported on 05/04/2015) 1 Inhaler 6  . nitroGLYCERIN (NITROSTAT) 0.4 MG SL tablet Place 0.4 mg under the tongue every 5 (five) minutes as needed. Reported on 05/04/2015    . sodium chloride (OCEAN) 0.65 % SOLN nasal spray Place 1 spray into both nostrils as needed for congestion. Reported on 05/04/2015    . aspirin 325 MG tablet Take 1 tablet by mouth daily. Reported on 04/06/2015    . benzonatate (TESSALON) 100 MG capsule Take 1  capsule (100 mg total) by mouth 3 (three) times daily as needed for cough. (Patient not taking: Reported on 05/04/2015) 60 capsule 0  . diphenhydrAMINE (BENADRYL) 25 MG tablet Take 25 mg by mouth every 6 (six) hours as needed. Reported on 05/04/2015    . furosemide (LASIX) 20 MG tablet Take 1 tablet (20 mg total) by mouth 2 (two) times daily. 180 tablet 3  . guaiFENesin (MUCINEX) 600 MG 12 hr tablet Take 2 tablets (1,200 mg total) by mouth 2 (two) times daily. (Patient not taking: Reported on 05/04/2015) 30 tablet 0  . Multiple Vitamins-Minerals (HEALTHY EYES) TABS Take 1 tablet by mouth daily. Reported on 05/04/2015    .  nystatin (MYCOSTATIN) 100000 UNIT/ML suspension Take 5 mLs by mouth 2 (two) times daily as needed. Reported on 05/04/2015     No facility-administered medications prior to visit.      Allergies:   Lipitor [atorvastatin] and Statins   Social History   Social History  . Marital status: Single    Spouse name: N/A  . Number of children: 3  . Years of education: N/A   Social History Main Topics  . Smoking status: Former Smoker    Years: 42.00    Types: Pipe    Quit date: 04/28/2011  . Smokeless tobacco: Never Used  . Alcohol use 3.5 oz/week    7 Standard drinks or equivalent per week     Comment: 02/14/2012 "weekends I have 6-7 mixed drinks"     occasional  . Drug use: No  . Sexual activity: Yes   Other Topics Concern  . None   Social History Narrative   Truck driver           Family History:  The patient's family history includes Heart failure in his brother and mother.   ROS:   Please see the history of present illness.    Pain in the right leg when he walks.  All other systems reviewed and are negative.   PHYSICAL EXAM:   VS:  BP 124/60   Pulse 64   Ht 6' (1.829 m)   Wt 212 lb (96.2 kg)   BMI 28.75 kg/m    GEN: Well nourished, well developed, in no acute distress  HEENT: normal  Neck: no JVD, carotid bruits, or masses Cardiac: RRR; no murmurs, rubs, or gallops,no edema  Respiratory:  clear to auscultation bilaterally, normal work of breathing GI: soft, nontender, nondistended, + BS MS: no deformity or atrophy  Skin: warm and dry, no rash Neuro:  Alert and Oriented x 3, Strength and sensation are intact Psych: euthymic mood, full affect  Wt Readings from Last 3 Encounters:  11/14/15 212 lb (96.2 kg)  04/06/15 219 lb (99.3 kg)  03/15/15 232 lb (105.2 kg)      Studies/Labs Reviewed:   EKG:  EKG  none  Recent Labs: 01/31/2015: B Natriuretic Peptide 40.9; TSH 0.608 02/01/2015: ALT 17 02/03/2015: BUN 24; Creatinine, Ser 1.21; Hemoglobin 14.8;  Platelets 158; Potassium 4.1; Sodium 137   Lipid Panel    Component Value Date/Time   CHOL 167 04/30/2013 1043   TRIG 75.0 04/30/2013 1043   HDL 64.90 04/30/2013 1043   CHOLHDL 3 04/30/2013 1043   VLDL 15.0 04/30/2013 1043   LDLCALC 87 04/30/2013 1043    Additional studies/ records that were reviewed today include:  No new data    ASSESSMENT:    1. Coronary artery disease involving native coronary artery of native heart without angina pectoris  2. COPD exacerbation (HCC)   3. Essential hypertension   4. Hyperlipemia   5. Hyperlipidemia      PLAN:  In order of problems listed above:  1. Stable. We encouraged aerobic activity. If increased activity he should call. 2. Per primary care. 3. Low-salt diet, exercise, weight loss with a cane. 4. Followed by primary care  Clinical follow-up in 12 months or before then if trouble.  Medication Adjustments/Labs and Tests Ordered: Current medicines are reviewed at length with the patient today.  Concerns regarding medicines are outlined above.  Medication changes, Labs and Tests ordered today are listed in the Patient Instructions below. There are no Patient Instructions on file for this visit.   Signed, Lesleigh Noe, MD  11/14/2015 11:45 AM    [facility] [address] [place], [place], [place]  [address] Phone: [phone]; Fax: [phone]

## 2015-11-14 NOTE — Patient Instructions (Signed)
Medication Instructions:  Your physician recommends that you continue on your current medications as directed. Please refer to the Current Medication list given to you today.   Labwork: None ordered  Testing/Procedures: None ordered  Follow-Up: Your physician wants you to follow-up in: 9-12 months with Dr.Smith You will receive a reminder letter in the mail two months in advance. If you don't receive a letter, please call our office to schedule the follow-up appointment.   Any Other Special Instructions Will Be Listed Below (If Applicable).     If you need a refill on your cardiac medications before your next appointment, please call your pharmacy.   

## 2016-01-01 ENCOUNTER — Emergency Department (HOSPITAL_COMMUNITY): Payer: Commercial Managed Care - HMO

## 2016-01-01 ENCOUNTER — Emergency Department (HOSPITAL_COMMUNITY)
Admission: EM | Admit: 2016-01-01 | Discharge: 2016-01-01 | Disposition: A | Discharge status: Home / Self Care | Payer: Commercial Managed Care - HMO | Attending: Emergency Medicine | Admitting: Emergency Medicine

## 2016-01-01 ENCOUNTER — Encounter (HOSPITAL_COMMUNITY): Payer: Self-pay | Admitting: *Deleted

## 2016-01-01 DIAGNOSIS — Z7982 Long term (current) use of aspirin: Secondary | ICD-10-CM | POA: Diagnosis not present

## 2016-01-01 DIAGNOSIS — M5412 Radiculopathy, cervical region: Secondary | ICD-10-CM | POA: Diagnosis not present

## 2016-01-01 DIAGNOSIS — Z955 Presence of coronary angioplasty implant and graft: Secondary | ICD-10-CM | POA: Insufficient documentation

## 2016-01-01 DIAGNOSIS — I2510 Atherosclerotic heart disease of native coronary artery without angina pectoris: Secondary | ICD-10-CM | POA: Insufficient documentation

## 2016-01-01 DIAGNOSIS — Z87891 Personal history of nicotine dependence: Secondary | ICD-10-CM | POA: Insufficient documentation

## 2016-01-01 DIAGNOSIS — M542 Cervicalgia: Secondary | ICD-10-CM | POA: Diagnosis present

## 2016-01-01 DIAGNOSIS — I252 Old myocardial infarction: Secondary | ICD-10-CM | POA: Diagnosis not present

## 2016-01-01 DIAGNOSIS — R51 Headache: Secondary | ICD-10-CM | POA: Diagnosis not present

## 2016-01-01 DIAGNOSIS — I10 Essential (primary) hypertension: Secondary | ICD-10-CM | POA: Diagnosis not present

## 2016-01-01 DIAGNOSIS — J449 Chronic obstructive pulmonary disease, unspecified: Secondary | ICD-10-CM | POA: Diagnosis not present

## 2016-01-01 MED ORDER — HYDROCODONE-ACETAMINOPHEN 5-325 MG PO TABS
2.0000 | ORAL_TABLET | Freq: Once | ORAL | Status: AC
  Administered 2016-01-01: 2 via ORAL
  Filled 2016-01-01: qty 2

## 2016-01-01 MED ORDER — NAPROXEN 375 MG PO TABS: 375.0000 mg | ORAL_TABLET | Freq: Two times a day (BID) | ORAL | 0 refills | Status: DC | PRN

## 2016-01-01 MED ORDER — CYCLOBENZAPRINE HCL 10 MG PO TABS: 10.0000 mg | ORAL_TABLET | Freq: Two times a day (BID) | ORAL | 0 refills | Status: DC | PRN

## 2016-01-01 MED ORDER — KETOROLAC TROMETHAMINE 60 MG/2ML IM SOLN
60.0000 mg | Freq: Once | INTRAMUSCULAR | Status: AC
  Administered 2016-01-01: 60 mg via INTRAMUSCULAR
  Filled 2016-01-01: qty 2

## 2016-01-01 MED ORDER — HYDROCODONE-ACETAMINOPHEN 5-325 MG PO TABS: 2.0000 | ORAL_TABLET | Freq: Four times a day (QID) | ORAL | 0 refills | Status: DC | PRN

## 2016-01-01 MED ORDER — METHYLPREDNISOLONE 4 MG PO TBPK: ORAL_TABLET | ORAL | 0 refills | Status: DC

## 2016-01-01 MED ORDER — PREDNISONE 20 MG PO TABS
60.0000 mg | ORAL_TABLET | Freq: Once | ORAL | Status: AC
  Administered 2016-01-01: 60 mg via ORAL
  Filled 2016-01-01: qty 3

## 2016-01-01 NOTE — ED Triage Notes (Signed)
The pt has had sharp pain in the lt and the back of his neck for 4 days.  He woke up this am with sharp pain  Having difficulty getting out of the bed this am

## 2016-01-01 NOTE — ED Notes (Signed)
Pt left and went out to car without his discharge papers. His son returned asking for medications. Retrieved d/c papers and went out to car to review his papers. Pt was not able to sign. Verbalized understanding.

## 2016-01-01 NOTE — ED Provider Notes (Signed)
[facility]-EMERGENCY DEPT Provider Note   CSN: [id number] Arrival date & time: 01/01/16  0610     History   Chief Complaint Chief Complaint  Patient presents with  . Neck Injury    HPI Mitchell [name] is a 75 y.o. male.  HPI 42 old male with past medical history of hypertension, hyperlipidemia, coronary artery disease who presents with left paraspinal neck pain and subjective weakness of his left upper extremity. The patient states that over the last several days he has had progressively worsening aching, throbbing but intermittently stabbing left neck pain. He states that he slipped "funny on it." Several days ago and the pain has persisted and worsened since then. The pain is made worse with any movement or palpation. Occasionally, the pain radiates down to his left shoulder. He noticed that he felt slightly weak with abduction and adduction of the shoulder but denies any distal numbness or weakness. He does have a known history of neck problems. He has never had neck surgery. No loss of bowel or bladder function. No lower extremity edema, weakness or numbness  Past Medical History:  Diagnosis Date  . Anginal pain (HCC) 01/2012  . Anxiety   . Atherosclerotic heart disease of native coronary artery without angina pectoris   . BPH (benign prostatic hypertrophy)   . CAD (coronary artery disease) 2007   with LAD/Diag CBPTCA and kissing   . COPD (chronic obstructive pulmonary disease) (HCC)    "maybe" (02/14/2012)  . Coronary artery disease   . Depression   . GERD (gastroesophageal reflux disease)   . Hyperlipidemia   . Hypertension   . Hypoxemia   . Myocardial infarction (HCC) 01/22/2012   NSTEMI with DES to RSA (PDA)  . Shortness of breath    "walking and laying down sometimes" (02/14/2012)    Patient Active Problem List   Diagnosis Date Noted  . Chronic respiratory failure (HCC) 03/15/2015  . COPD exacerbation (HCC) 01/31/2015  . Vision impairment 01/31/2015  . Right foot  pain 01/31/2015  . Obesity 01/31/2015  . Alcohol use (HCC) 01/31/2015  . COPD (chronic obstructive pulmonary disease) (HCC) 04/30/2013  . Dyspnea 02/14/2012  . Hyperlipemia   . History of depression   . Chest pain 01/23/2012  . CAD (coronary artery disease) 01/23/2012  . Cholelithiasis   . Hypertension 04/29/2011    Past Surgical History:  Procedure Laterality Date  . CATARACT EXTRACTION W/ INTRAOCULAR LENS  IMPLANT, BILATERAL  ~ 2010  . CORONARY ANGIOPLASTY WITH STENT PLACEMENT  05/11/2005   PCI OF LAD  . GANGLION CYST REMOVED  1956?   right  . LEFT HEART CATHETERIZATION WITH CORONARY ANGIOGRAM Bilateral 01/24/2012   Procedure: LEFT HEART CATHETERIZATION WITH CORONARY ANGIOGRAM;  Surgeon: Lesleigh Noe, MD;  Location: [facility] CATH LAB;  Service: Cardiovascular;  Laterality: Bilateral;  . REFRACTIVE SURGERY  2010   left       Home Medications    Prior to Admission medications   Medication Sig Start Date End Date Taking? Authorizing Provider  albuterol (PROVENTIL HFA;VENTOLIN HFA) 108 (90 BASE) MCG/ACT inhaler Inhale 2 puffs into the lungs every 6 (six) hours as needed for wheezing. Reported on 05/04/2015    Historical Provider, MD  aspirin EC 81 MG EC tablet Take 1 tablet (81 mg total) by mouth daily. 02/15/12   Corky Crafts, MD  atenolol (TENORMIN) 25 MG tablet Take 1 tablet (25 mg total) by mouth every morning. 02/17/15   Beatrice Lecher, PA-C  atorvastatin (LIPITOR)  10 MG tablet Take 10 mg by mouth daily.    Historical Provider, MD  clopidogrel (PLAVIX) 75 MG tablet Take 1 tablet (75 mg total) by mouth daily with breakfast. 02/17/15   Beatrice LecherScott T Weaver, PA-C  cyclobenzaprine (FLEXERIL) 10 MG tablet Take 1 tablet (10 mg total) by mouth 2 (two) times daily as needed for muscle spasms. 01/01/16   Shaune Pollackameron Rhythm Wigfall, MD  finasteride (PROSCAR) 5 MG tablet Take 5 mg by mouth daily. Reported on 05/04/2015    Historical Provider, MD  furosemide (LASIX) 40 MG tablet Take 40 mg by mouth 2  (two) times daily. 10/06/15   Historical Provider, MD  HYDROcodone-acetaminophen (NORCO/VICODIN) 5-325 MG tablet Take 2 tablets by mouth every 6 (six) hours as needed for severe pain. 01/01/16   Shaune Pollackameron Avari Nevares, MD  methylPREDNISolone (MEDROL DOSEPAK) 4 MG TBPK tablet Use as per package 01/01/16   Shaune Pollackameron Icie Kuznicki, MD  metolazone (ZAROXOLYN) 2.5 MG tablet Take 2.5 mg by mouth 3 (three) times a week. Before taking Tuesday, Thursday, Saturday    Historical Provider, MD  mometasone-formoterol (DULERA) 100-5 MCG/ACT AERO Inhale 2 puffs into the lungs 2 (two) times daily. Patient not taking: Reported on 05/04/2015 02/08/15   Oretha Milchakesh V Alva, MD  naproxen (NAPROSYN) 375 MG tablet Take 1 tablet (375 mg total) by mouth 2 (two) times daily as needed for moderate pain. 01/01/16   Shaune Pollackameron Latron Ribas, MD  nitroGLYCERIN (NITROSTAT) 0.4 MG SL tablet Place 0.4 mg under the tongue every 5 (five) minutes as needed. Reported on 05/04/2015    Historical Provider, MD  sodium chloride (OCEAN) 0.65 % SOLN nasal spray Place 1 spray into both nostrils as needed for congestion. Reported on 05/04/2015    Historical Provider, MD  tamsulosin (FLOMAX) 0.4 MG CAPS capsule Take 0.4 mg by mouth daily after breakfast. Reported on 05/04/2015 03/19/13   Historical Provider, MD    Family History Family History  Problem Relation Age of Onset  . Heart failure Mother   . Heart failure Brother     Social History Social History  Substance Use Topics  . Smoking status: Former Smoker    Years: 42.00    Types: Pipe    Quit date: 04/28/2011  . Smokeless tobacco: Never Used  . Alcohol use 3.5 oz/week    7 Standard drinks or equivalent per week     Comment: 02/14/2012 "weekends I have 6-7 mixed drinks"     occasional     Allergies   Lipitor [atorvastatin] and Statins   Review of Systems Review of Systems  Constitutional: Negative for chills, fatigue and fever.  HENT: Negative for congestion and rhinorrhea.   Eyes: Negative for visual  disturbance.  Respiratory: Negative for cough, shortness of breath and wheezing.   Cardiovascular: Negative for chest pain and leg swelling.  Gastrointestinal: Negative for abdominal pain, diarrhea, nausea and vomiting.  Genitourinary: Negative for dysuria and flank pain.  Musculoskeletal: Positive for neck pain and neck stiffness.  Skin: Negative for rash and wound.  Allergic/Immunologic: Negative for immunocompromised state.  Neurological: Positive for headaches. Negative for syncope and weakness.  All other systems reviewed and are negative.    Physical Exam Updated Vital Signs BP 164/87   Pulse (!) 48   Temp 97.4 F (36.3 C) (Oral)   Resp 16   SpO2 97%   Physical Exam  Constitutional: He is oriented to person, place, and time. He appears well-developed and well-nourished. No distress.  HENT:  Head: Normocephalic and atraumatic.  Eyes: Conjunctivae are  normal.  Neck: Neck supple.    Cardiovascular: Normal rate, regular rhythm and normal heart sounds.  Exam reveals no friction rub.   No murmur heard. Pulmonary/Chest: Effort normal and breath sounds normal. No respiratory distress. He has no wheezes. He has no rales.  Abdominal: He exhibits no distension.  Musculoskeletal: He exhibits no edema.  Neurological: He is alert and oriented to person, place, and time. He has normal strength. No cranial nerve deficit or sensory deficit. He exhibits normal muscle tone. GCS eye subscore is 4. GCS verbal subscore is 5. GCS motor subscore is 6.  Skin: Skin is warm. Capillary refill takes less than 2 seconds.  Psychiatric: He has a normal mood and affect.  Nursing note and vitals reviewed.  Strength 5/5 with shoulder abduction, adduction, flexion, extension, elbow flexion/extension/pronation/supination, wrist flexion/extension, grip strength b/l Normal sensation to distal UE bilaterally 2+ brachial reflexes b/l   ED Treatments / Results  Labs (all labs ordered are listed, but only  abnormal results are displayed) Labs Reviewed - No data to display  EKG  EKG Interpretation  Date/Time:  Sunday January 01 2016 06:19:53 EDT Ventricular Rate:  72 PR Interval:  168 QRS Duration: 78 QT Interval:  390 QTC Calculation: 427 R Axis:   63 Text Interpretation:  Normal sinus rhythm Normal ECG No significant change since last tracing Confirmed by Devinne Epstein MD, Sheria Lang [phone]) on 01/01/2016 9:16:38 AM       Radiology Dg Cervical Spine Complete  Result Date: 01/01/2016 CLINICAL DATA:  Patient with intermittent left-sided cervical spine pain for 3- 4 days. No known injury. EXAM: CERVICAL SPINE - COMPLETE 4+ VIEW COMPARISON:  Cervical spine radiograph 06/22/2011 FINDINGS: Re- demonstrated 3 mm retrolisthesis C3 on C4. C3-4 degenerative disc disease. Visualization through C6 on lateral view. Lateral masses articulate appropriately with the dens. Lung apices are clear. No acute cervical spine fracture. Multilevel facet degenerative changes C2-3 and C3-4. IMPRESSION: Mild retrolisthesis of C3 on C4 with associated degenerative disc disease at this level. Multilevel facet degenerative changes. No acute osseous abnormality. Electronically Signed   By: Annia Belt M.D.   On: 01/01/2016 09:01   Ct Head Wo Contrast  Result Date: 01/01/2016 CLINICAL DATA:  75 year old male with left-sided headache and neck/cervical spine pain for 4 days. EXAM: CT HEAD WITHOUT CONTRAST CT CERVICAL SPINE WITHOUT CONTRAST TECHNIQUE: Multidetector CT imaging of the head and cervical spine was performed following the standard protocol without intravenous contrast. Multiplanar CT image reconstructions of the cervical spine were also generated. COMPARISON:  06/22/2011 cervical spine radiographs FINDINGS: CT HEAD FINDINGS Brain: No evidence of acute infarction, hemorrhage, hydrocephalus, extra-axial collection or mass lesion/mass effect. Moderate atrophy and mild probable chronic small-vessel white matter ischemic changes  noted. Vascular: Mild intracranial atherosclerotic calcifications Skull: Normal. Negative for fracture or focal lesion. Sinuses/Orbits: No acute finding. Other: None CT CERVICAL SPINE FINDINGS Alignment: 3 mm retrolisthesis of C3 on C4 is unchanged from 2013. No acute subluxation identified. Skull base and vertebrae: No acute fracture. No suspicious focal bony abnormality. Soft tissues and spinal canal: No prevertebral soft tissue swelling. No soft tissue abnormalities identified. Disc levels: Multilevel degenerative disc disease, severe at C3-4 and moderate at C6-7 noted. Moderate multilevel facet arthropathy identified. These findings contribute to the following: Mild central spinal narrowing at C2-3. Moderate -severe central spinal and foraminal narrowing at C3-4. Mild central spinal and biforaminal narrowing at C4-5. Mild to moderate biforaminal narrowing at C5-6. Moderate central spinal and biforaminal narrowing at C6-7. Upper chest: Unremarkable  Other: None IMPRESSION: No evidence of acute intracranial abnormality. Atrophy and probable mild chronic small-vessel white matter ischemic changes. No evidence of acute abnormality within the cervical spine. Degenerative changes contributing to central spinal and foraminal narrowing as discussed above. Electronically Signed   By: Harmon Pier M.D.   On: 01/01/2016 12:40   Ct Cervical Spine Wo Contrast  Result Date: 01/01/2016 CLINICAL DATA:  75 year old male with left-sided headache and neck/cervical spine pain for 4 days. EXAM: CT HEAD WITHOUT CONTRAST CT CERVICAL SPINE WITHOUT CONTRAST TECHNIQUE: Multidetector CT imaging of the head and cervical spine was performed following the standard protocol without intravenous contrast. Multiplanar CT image reconstructions of the cervical spine were also generated. COMPARISON:  06/22/2011 cervical spine radiographs FINDINGS: CT HEAD FINDINGS Brain: No evidence of acute infarction, hemorrhage, hydrocephalus, extra-axial  collection or mass lesion/mass effect. Moderate atrophy and mild probable chronic small-vessel white matter ischemic changes noted. Vascular: Mild intracranial atherosclerotic calcifications Skull: Normal. Negative for fracture or focal lesion. Sinuses/Orbits: No acute finding. Other: None CT CERVICAL SPINE FINDINGS Alignment: 3 mm retrolisthesis of C3 on C4 is unchanged from 2013. No acute subluxation identified. Skull base and vertebrae: No acute fracture. No suspicious focal bony abnormality. Soft tissues and spinal canal: No prevertebral soft tissue swelling. No soft tissue abnormalities identified. Disc levels: Multilevel degenerative disc disease, severe at C3-4 and moderate at C6-7 noted. Moderate multilevel facet arthropathy identified. These findings contribute to the following: Mild central spinal narrowing at C2-3. Moderate -severe central spinal and foraminal narrowing at C3-4. Mild central spinal and biforaminal narrowing at C4-5. Mild to moderate biforaminal narrowing at C5-6. Moderate central spinal and biforaminal narrowing at C6-7. Upper chest: Unremarkable Other: None IMPRESSION: No evidence of acute intracranial abnormality. Atrophy and probable mild chronic small-vessel white matter ischemic changes. No evidence of acute abnormality within the cervical spine. Degenerative changes contributing to central spinal and foraminal narrowing as discussed above. Electronically Signed   By: Harmon Pier M.D.   On: 01/01/2016 12:40    Procedures Procedures (including critical care time)  Medications Ordered in ED Medications  HYDROcodone-acetaminophen (NORCO/VICODIN) 5-325 MG per tablet 2 tablet (2 tablets Oral Given 01/01/16 0920)  ketorolac (TORADOL) injection 60 mg (60 mg Intramuscular Given 01/01/16 0920)  predniSONE (DELTASONE) tablet 60 mg (60 mg Oral Given 01/01/16 1108)     Initial Impression / Assessment and Plan / ED Course  I have reviewed the triage vital signs and the nursing  notes.  Pertinent labs & imaging results that were available during my care of the patient were reviewed by me and considered in my medical decision making (see chart for details).  Clinical Course  Value Comment By Time  ED EKG (Reviewed) Shaune Pollack, MD 09/68 [id number]    74 year old male with past medical history of hypertension, hyperlipidemia, coronary artery disease who presents with left paraspinal neck pain and intermittent tingling in his left upper extremity. Patient has known cervical radiculopathy. Denies any direct trauma, but his symptoms did start after he slept in a weird position. On my exam, the patient is fully intact. Distal strength and sensation throughout the left upper and right upper lower extremities as well as right upper extremity. He has subjectively decreased sensation along the lateral aspect of the shoulder but no evidence of weakness or other numbness in his left arm. He has exquisite tenderness to palpation over the left paraspinal area involving both the trapezius and sternocleidomastoid. This perfectly reproduces his neck pain. Primary suspicion at this time  is likely acute musculoskeletal pain with component of cervical radiculopathy. As mentioned, no signs of cord compression. No fevers, chills or signs of osteo-myelitis or epidural abscess. Plain films show severe degenerative disease and this is confirmed with CT, but otherwise no acute fractures or malalignments. CT of the head is negative for any abnormality as well. Symptoms are improved with by mouth pain control. Otherwise, patient denies any chest pain, shortness of breath, palpitations, or any referred pain from the chest. His EKG is unremarkable. Will treat as likely muscular skeletal pain with close outpatient follow-up  Final Clinical Impressions(s) / ED Diagnoses   Final diagnoses:  Cervical radiculopathy  Neck pain    New Prescriptions Discharge Medication List as of 01/01/2016  1:10 PM    START  taking these medications   Details  cyclobenzaprine (FLEXERIL) 10 MG tablet Take 1 tablet (10 mg total) by mouth 2 (two) times daily as needed for muscle spasms., Starting Sun 01/01/2016, Print    HYDROcodone-acetaminophen (NORCO/VICODIN) 5-325 MG tablet Take 2 tablets by mouth every 6 (six) hours as needed for severe pain., Starting Sun 01/01/2016, Print    naproxen (NAPROSYN) 375 MG tablet Take 1 tablet (375 mg total) by mouth 2 (two) times daily as needed for moderate pain., Starting Sun 01/01/2016, Print         Shaune Pollack, MD 01/01/16 2013

## 2016-01-04 ENCOUNTER — Other Ambulatory Visit: Payer: Self-pay | Admitting: Physician Assistant

## 2016-05-01 ENCOUNTER — Encounter (HOSPITAL_COMMUNITY): Payer: Self-pay | Admitting: Emergency Medicine

## 2016-05-01 ENCOUNTER — Inpatient Hospital Stay (HOSPITAL_COMMUNITY)
Admission: EM | Admit: 2016-05-01 | Discharge: 2016-05-03 | DRG: 191 | Disposition: A | Discharge status: Home Health | Payer: Medicare PPO | Attending: Internal Medicine | Admitting: Internal Medicine

## 2016-05-01 ENCOUNTER — Emergency Department (HOSPITAL_COMMUNITY): Payer: Medicare PPO

## 2016-05-01 ENCOUNTER — Observation Stay (HOSPITAL_COMMUNITY): Payer: Medicare PPO

## 2016-05-01 DIAGNOSIS — N400 Enlarged prostate without lower urinary tract symptoms: Secondary | ICD-10-CM | POA: Diagnosis present

## 2016-05-01 DIAGNOSIS — J101 Influenza due to other identified influenza virus with other respiratory manifestations: Secondary | ICD-10-CM | POA: Diagnosis not present

## 2016-05-01 DIAGNOSIS — N179 Acute kidney failure, unspecified: Secondary | ICD-10-CM | POA: Diagnosis not present

## 2016-05-01 DIAGNOSIS — Z9841 Cataract extraction status, right eye: Secondary | ICD-10-CM

## 2016-05-01 DIAGNOSIS — I10 Essential (primary) hypertension: Secondary | ICD-10-CM | POA: Diagnosis present

## 2016-05-01 DIAGNOSIS — F17290 Nicotine dependence, other tobacco product, uncomplicated: Secondary | ICD-10-CM | POA: Diagnosis present

## 2016-05-01 DIAGNOSIS — Z888 Allergy status to other drugs, medicaments and biological substances status: Secondary | ICD-10-CM

## 2016-05-01 DIAGNOSIS — J441 Chronic obstructive pulmonary disease with (acute) exacerbation: Principal | ICD-10-CM | POA: Diagnosis present

## 2016-05-01 DIAGNOSIS — J1100 Influenza due to unidentified influenza virus with unspecified type of pneumonia: Secondary | ICD-10-CM | POA: Diagnosis not present

## 2016-05-01 DIAGNOSIS — Z955 Presence of coronary angioplasty implant and graft: Secondary | ICD-10-CM

## 2016-05-01 DIAGNOSIS — M109 Gout, unspecified: Secondary | ICD-10-CM

## 2016-05-01 DIAGNOSIS — Z7982 Long term (current) use of aspirin: Secondary | ICD-10-CM

## 2016-05-01 DIAGNOSIS — Z79899 Other long term (current) drug therapy: Secondary | ICD-10-CM

## 2016-05-01 DIAGNOSIS — J111 Influenza due to unidentified influenza virus with other respiratory manifestations: Secondary | ICD-10-CM | POA: Diagnosis present

## 2016-05-01 DIAGNOSIS — E785 Hyperlipidemia, unspecified: Secondary | ICD-10-CM | POA: Diagnosis present

## 2016-05-01 DIAGNOSIS — F329 Major depressive disorder, single episode, unspecified: Secondary | ICD-10-CM | POA: Diagnosis present

## 2016-05-01 DIAGNOSIS — Z961 Presence of intraocular lens: Secondary | ICD-10-CM | POA: Diagnosis present

## 2016-05-01 DIAGNOSIS — E860 Dehydration: Secondary | ICD-10-CM | POA: Diagnosis present

## 2016-05-01 DIAGNOSIS — I252 Old myocardial infarction: Secondary | ICD-10-CM

## 2016-05-01 DIAGNOSIS — M19079 Primary osteoarthritis, unspecified ankle and foot: Secondary | ICD-10-CM

## 2016-05-01 DIAGNOSIS — H547 Unspecified visual loss: Secondary | ICD-10-CM | POA: Diagnosis present

## 2016-05-01 DIAGNOSIS — Z9842 Cataract extraction status, left eye: Secondary | ICD-10-CM

## 2016-05-01 DIAGNOSIS — Z7902 Long term (current) use of antithrombotics/antiplatelets: Secondary | ICD-10-CM

## 2016-05-01 DIAGNOSIS — I959 Hypotension, unspecified: Secondary | ICD-10-CM | POA: Diagnosis present

## 2016-05-01 DIAGNOSIS — N189 Chronic kidney disease, unspecified: Secondary | ICD-10-CM

## 2016-05-01 DIAGNOSIS — I2510 Atherosclerotic heart disease of native coronary artery without angina pectoris: Secondary | ICD-10-CM | POA: Diagnosis present

## 2016-05-01 DIAGNOSIS — Z8249 Family history of ischemic heart disease and other diseases of the circulatory system: Secondary | ICD-10-CM

## 2016-05-01 DIAGNOSIS — Z7951 Long term (current) use of inhaled steroids: Secondary | ICD-10-CM

## 2016-05-01 LAB — URINALYSIS, ROUTINE W REFLEX MICROSCOPIC
BILIRUBIN URINE: NEGATIVE
Glucose, UA: NEGATIVE mg/dL
HGB URINE DIPSTICK: NEGATIVE
Ketones, ur: NEGATIVE mg/dL
Leukocytes, UA: NEGATIVE
Nitrite: NEGATIVE
PROTEIN: NEGATIVE mg/dL
Specific Gravity, Urine: 1.019 (ref 1.005–1.030)
pH: 5 (ref 5.0–8.0)

## 2016-05-01 LAB — CBC WITH DIFFERENTIAL/PLATELET
Basophils Absolute: 0 10*3/uL (ref 0.0–0.1)
Basophils Relative: 0 %
EOS ABS: 0.3 10*3/uL (ref 0.0–0.7)
EOS PCT: 2 %
HCT: 45.3 % (ref 39.0–52.0)
Hemoglobin: 15 g/dL (ref 13.0–17.0)
Lymphocytes Relative: 9 %
Lymphs Abs: 1 10*3/uL (ref 0.7–4.0)
MCH: 29.1 pg (ref 26.0–34.0)
MCHC: 33.1 g/dL (ref 30.0–36.0)
MCV: 88 fL (ref 78.0–100.0)
Monocytes Absolute: 0.6 10*3/uL (ref 0.1–1.0)
Monocytes Relative: 6 %
Neutro Abs: 9.7 10*3/uL — ABNORMAL HIGH (ref 1.7–7.7)
Neutrophils Relative %: 83 %
PLATELETS: 177 10*3/uL (ref 150–400)
RBC: 5.15 MIL/uL (ref 4.22–5.81)
RDW: 15.6 % — ABNORMAL HIGH (ref 11.5–15.5)
WBC: 11.6 10*3/uL — AB (ref 4.0–10.5)

## 2016-05-01 LAB — COMPREHENSIVE METABOLIC PANEL
ALK PHOS: 73 U/L (ref 38–126)
ALT: 45 U/L (ref 17–63)
AST: 53 U/L — AB (ref 15–41)
Albumin: 3.5 g/dL (ref 3.5–5.0)
Anion gap: 11 (ref 5–15)
BILIRUBIN TOTAL: 0.8 mg/dL (ref 0.3–1.2)
BUN: 37 mg/dL — AB (ref 6–20)
CALCIUM: 8.8 mg/dL — AB (ref 8.9–10.3)
CO2: 32 mmol/L (ref 22–32)
CREATININE: 1.74 mg/dL — AB (ref 0.61–1.24)
Chloride: 98 mmol/L — ABNORMAL LOW (ref 101–111)
GFR calc Af Amer: 42 mL/min — ABNORMAL LOW (ref >60)
GFR, EST NON AFRICAN AMERICAN: 36 mL/min — AB (ref >60)
Glucose, Bld: 107 mg/dL — ABNORMAL HIGH (ref 65–99)
Potassium: 3.5 mmol/L (ref 3.5–5.1)
Sodium: 141 mmol/L (ref 135–145)
TOTAL PROTEIN: 6.4 g/dL — AB (ref 6.5–8.1)

## 2016-05-01 LAB — CBC
HCT: 39.9 % (ref 39.0–52.0)
HEMOGLOBIN: 13.3 g/dL (ref 13.0–17.0)
MCH: 28.9 pg (ref 26.0–34.0)
MCHC: 33.3 g/dL (ref 30.0–36.0)
MCV: 86.6 fL (ref 78.0–100.0)
Platelets: 151 10*3/uL (ref 150–400)
RBC: 4.61 MIL/uL (ref 4.22–5.81)
RDW: 15.5 % (ref 11.5–15.5)
WBC: 17.3 10*3/uL — AB (ref 4.0–10.5)

## 2016-05-01 LAB — CREATININE, SERUM
Creatinine, Ser: 1.59 mg/dL — ABNORMAL HIGH (ref 0.61–1.24)
GFR, EST AFRICAN AMERICAN: 47 mL/min — AB (ref >60)
GFR, EST NON AFRICAN AMERICAN: 41 mL/min — AB (ref >60)

## 2016-05-01 LAB — INFLUENZA PANEL BY PCR (TYPE A & B)
INFLAPCR: POSITIVE — AB
Influenza B By PCR: NEGATIVE

## 2016-05-01 LAB — I-STAT CG4 LACTIC ACID, ED
Lactic Acid, Venous: 1.92 mmol/L (ref 0.5–1.9)
Lactic Acid, Venous: 1.77 mmol/L (ref 0.5–1.9)

## 2016-05-01 LAB — TROPONIN I: Troponin I: <0.03 ng/mL (ref <0.03)

## 2016-05-01 MED ORDER — FINASTERIDE 5 MG PO TABS
5.0000 mg | ORAL_TABLET | Freq: Every day | ORAL | Status: DC
  Administered 2016-05-02 – 2016-05-03 (×2): 5 mg via ORAL
  Filled 2016-05-01 (×2): qty 1

## 2016-05-01 MED ORDER — OSELTAMIVIR PHOSPHATE 75 MG PO CAPS
75.0000 mg | ORAL_CAPSULE | Freq: Once | ORAL | Status: DC
  Filled 2016-05-01: qty 1

## 2016-05-01 MED ORDER — ALBUTEROL SULFATE (2.5 MG/3ML) 0.083% IN NEBU: 2.5000 mg | INHALATION_SOLUTION | RESPIRATORY_TRACT | Status: DC | PRN

## 2016-05-01 MED ORDER — DEXTROSE 5 % IV SOLN: 500.0000 mg | INTRAVENOUS | Status: DC

## 2016-05-01 MED ORDER — SALINE SPRAY 0.65 % NA SOLN
1.0000 | NASAL | Status: DC | PRN
  Filled 2016-05-01: qty 44

## 2016-05-01 MED ORDER — DEXTROSE 5 % IV SOLN: 1.0000 g | INTRAVENOUS | Status: DC

## 2016-05-01 MED ORDER — IPRATROPIUM-ALBUTEROL 0.5-2.5 (3) MG/3ML IN SOLN
3.0000 mL | Freq: Once | RESPIRATORY_TRACT | Status: AC
  Administered 2016-05-01: 3 mL via RESPIRATORY_TRACT
  Filled 2016-05-01: qty 3

## 2016-05-01 MED ORDER — CYCLOBENZAPRINE HCL 10 MG PO TABS
10.0000 mg | ORAL_TABLET | Freq: Two times a day (BID) | ORAL | Status: DC | PRN
  Administered 2016-05-02: 10 mg via ORAL
  Filled 2016-05-01: qty 1

## 2016-05-01 MED ORDER — HEPARIN SODIUM (PORCINE) 5000 UNIT/ML IJ SOLN
5000.0000 [IU] | Freq: Three times a day (TID) | INTRAMUSCULAR | Status: DC
  Administered 2016-05-01 – 2016-05-03 (×4): 5000 [IU] via SUBCUTANEOUS
  Filled 2016-05-01 (×4): qty 1

## 2016-05-01 MED ORDER — NITROGLYCERIN 0.4 MG SL SUBL: 0.4000 mg | SUBLINGUAL_TABLET | SUBLINGUAL | Status: DC | PRN

## 2016-05-01 MED ORDER — SODIUM CHLORIDE 0.9 % IV BOLUS (SEPSIS)
1000.0000 mL | Freq: Once | INTRAVENOUS | Status: DC
  Administered 2016-05-01: 1000 mL via INTRAVENOUS

## 2016-05-01 MED ORDER — OSELTAMIVIR PHOSPHATE 30 MG PO CAPS
30.0000 mg | ORAL_CAPSULE | Freq: Two times a day (BID) | ORAL | Status: DC
  Administered 2016-05-01: 75 mg via ORAL
  Administered 2016-05-02 – 2016-05-03 (×3): 30 mg via ORAL
  Filled 2016-05-01 (×4): qty 1

## 2016-05-01 MED ORDER — TAMSULOSIN HCL 0.4 MG PO CAPS
0.4000 mg | ORAL_CAPSULE | Freq: Every day | ORAL | Status: DC
  Administered 2016-05-02 – 2016-05-03 (×2): 0.4 mg via ORAL
  Filled 2016-05-01 (×2): qty 1

## 2016-05-01 MED ORDER — ALBUTEROL SULFATE (2.5 MG/3ML) 0.083% IN NEBU
5.0000 mg | INHALATION_SOLUTION | Freq: Once | RESPIRATORY_TRACT | Status: AC
  Administered 2016-05-01: 5 mg via RESPIRATORY_TRACT
  Filled 2016-05-01: qty 6

## 2016-05-01 MED ORDER — HYDROCODONE-ACETAMINOPHEN 5-325 MG PO TABS
2.0000 | ORAL_TABLET | Freq: Four times a day (QID) | ORAL | Status: DC | PRN
  Administered 2016-05-01 – 2016-05-03 (×4): 2 via ORAL
  Filled 2016-05-01 (×4): qty 2

## 2016-05-01 MED ORDER — SODIUM CHLORIDE 0.9 % IV SOLN
INTRAVENOUS | Status: AC
  Administered 2016-05-01 – 2016-05-02 (×2): via INTRAVENOUS

## 2016-05-01 MED ORDER — SODIUM CHLORIDE 0.9 % IV BOLUS (SEPSIS)
1000.0000 mL | Freq: Once | INTRAVENOUS | Status: AC
  Administered 2016-05-01: 1000 mL via INTRAVENOUS

## 2016-05-01 MED ORDER — MOMETASONE FURO-FORMOTEROL FUM 100-5 MCG/ACT IN AERO
2.0000 | INHALATION_SPRAY | Freq: Two times a day (BID) | RESPIRATORY_TRACT | Status: DC
  Administered 2016-05-01 – 2016-05-03 (×4): 2 via RESPIRATORY_TRACT
  Filled 2016-05-01: qty 8.8

## 2016-05-01 MED ORDER — IBUPROFEN 200 MG PO TABS
600.0000 mg | ORAL_TABLET | Freq: Once | ORAL | Status: AC
  Administered 2016-05-01: 600 mg via ORAL
  Filled 2016-05-01: qty 3

## 2016-05-01 MED ORDER — SODIUM CHLORIDE 0.9 % IV BOLUS (SEPSIS): 1000.0000 mL | Freq: Once | INTRAVENOUS | Status: DC

## 2016-05-01 MED ORDER — CLOPIDOGREL BISULFATE 75 MG PO TABS
75.0000 mg | ORAL_TABLET | Freq: Every day | ORAL | Status: DC
  Administered 2016-05-02 – 2016-05-03 (×2): 75 mg via ORAL
  Filled 2016-05-01 (×2): qty 1

## 2016-05-01 MED ORDER — ONDANSETRON HCL 4 MG/2ML IJ SOLN
4.0000 mg | Freq: Once | INTRAMUSCULAR | Status: AC
  Administered 2016-05-01: 4 mg via INTRAVENOUS
  Filled 2016-05-01: qty 2

## 2016-05-01 MED ORDER — PIPERACILLIN-TAZOBACTAM 3.375 G IVPB 30 MIN
3.3750 g | Freq: Once | INTRAVENOUS | Status: AC
  Administered 2016-05-01: 3.375 g via INTRAVENOUS
  Filled 2016-05-01: qty 50

## 2016-05-01 MED ORDER — IPRATROPIUM-ALBUTEROL 0.5-2.5 (3) MG/3ML IN SOLN
3.0000 mL | Freq: Four times a day (QID) | RESPIRATORY_TRACT | Status: DC
  Administered 2016-05-01 – 2016-05-02 (×2): 3 mL via RESPIRATORY_TRACT
  Filled 2016-05-01 (×2): qty 3

## 2016-05-01 MED ORDER — ASPIRIN EC 81 MG PO TBEC
81.0000 mg | DELAYED_RELEASE_TABLET | Freq: Every day | ORAL | Status: DC
  Administered 2016-05-02 – 2016-05-03 (×2): 81 mg via ORAL
  Filled 2016-05-01 (×2): qty 1

## 2016-05-01 MED ORDER — METHYLPREDNISOLONE SODIUM SUCC 125 MG IJ SOLR
125.0000 mg | Freq: Two times a day (BID) | INTRAMUSCULAR | Status: DC
  Administered 2016-05-02 – 2016-05-03 (×3): 125 mg via INTRAVENOUS
  Filled 2016-05-01 (×3): qty 2

## 2016-05-01 MED ORDER — VANCOMYCIN HCL IN DEXTROSE 1-5 GM/200ML-% IV SOLN
1000.0000 mg | Freq: Once | INTRAVENOUS | Status: AC
  Administered 2016-05-01: 1000 mg via INTRAVENOUS
  Filled 2016-05-01: qty 200

## 2016-05-01 MED ORDER — ZOLPIDEM TARTRATE 5 MG PO TABS
5.0000 mg | ORAL_TABLET | Freq: Every evening | ORAL | Status: DC | PRN
  Administered 2016-05-01 – 2016-05-02 (×2): 5 mg via ORAL
  Filled 2016-05-01 (×2): qty 1

## 2016-05-01 MED ORDER — METHYLPREDNISOLONE SODIUM SUCC 125 MG IJ SOLR
125.0000 mg | Freq: Once | INTRAMUSCULAR | Status: AC
  Administered 2016-05-01: 125 mg via INTRAVENOUS
  Filled 2016-05-01: qty 2

## 2016-05-01 MED ORDER — ACETAMINOPHEN 325 MG PO TABS
650.0000 mg | ORAL_TABLET | Freq: Once | ORAL | Status: AC
  Administered 2016-05-01: 650 mg via ORAL
  Filled 2016-05-01: qty 2

## 2016-05-01 NOTE — ED Notes (Signed)
RN spoke to floor.  They are unsure what RN will be taking Pt at this time.  Bring Pt up at 1930.

## 2016-05-01 NOTE — ED Notes (Signed)
O2 begun via [place] at 1L

## 2016-05-01 NOTE — ED Notes (Signed)
RN spoke with hospitalist regarding BP and need for admission orders

## 2016-05-01 NOTE — ED Provider Notes (Signed)
[facility]-EMERGENCY DEPT Provider Note   CSN: [id number] Arrival date & time: 05/01/16  1102     History   Chief Complaint Chief Complaint  Patient presents with  . Cough  . Shortness of Breath    HPI Mitchell [name] is a 76 y.o. male.  HPI   Mitchell BossierJarald Dean Hohensee is a 76 y.o. male, with a history of COPD, CAD, MI, and HTN, presenting to the ED with productive cough with brown sputum and shortness of breath worsening over the last 4 days. Also endorses headache, body aches, subjective fever, generalized weakness, nausea, and chills during the same time period. He has had 3 loose stools over the past 3 days. Pt has taken DayQuil and tylenol this morning around 9 am this morning. Pt has not taken any of his prescribed medications today. Endorses a poor oral intake.  Denies chest pain, abdominal pain, vomiting, or any other complaints. Pt has not had vaccination against influenza or pneumonia. Pt is a former everyday pipe smoker. States he quit 5 years ago.   Past Medical History:  Diagnosis Date  . Anginal pain (HCC) 01/2012  . Anxiety   . Atherosclerotic heart disease of native coronary artery without angina pectoris   . BPH (benign prostatic hypertrophy)   . CAD (coronary artery disease) 2007   with LAD/Diag CBPTCA and kissing   . COPD (chronic obstructive pulmonary disease) (HCC)    "maybe" (02/14/2012)  . Coronary artery disease   . Depression   . GERD (gastroesophageal reflux disease)   . Hyperlipidemia   . Hypertension   . Hypoxemia   . Myocardial infarction 01/22/2012   NSTEMI with DES to RSA (PDA)  . Shortness of breath    "walking and laying down sometimes" (02/14/2012)    Patient Active Problem List   Diagnosis Date Noted  . Chronic respiratory failure (HCC) 03/15/2015  . COPD exacerbation (HCC) 01/31/2015  . Vision impairment 01/31/2015  . Right foot pain 01/31/2015  . Obesity 01/31/2015  . Alcohol use 01/31/2015  . COPD (chronic obstructive pulmonary  disease) (HCC) 04/30/2013  . Dyspnea 02/14/2012  . Hyperlipemia   . History of depression   . Chest pain 01/23/2012  . CAD (coronary artery disease) 01/23/2012  . Cholelithiasis   . Hypertension 04/29/2011    Past Surgical History:  Procedure Laterality Date  . CATARACT EXTRACTION W/ INTRAOCULAR LENS  IMPLANT, BILATERAL  ~ 2010  . CORONARY ANGIOPLASTY WITH STENT PLACEMENT  05/11/2005   PCI OF LAD  . GANGLION CYST REMOVED  1956?   right  . LEFT HEART CATHETERIZATION WITH CORONARY ANGIOGRAM Bilateral 01/24/2012   Procedure: LEFT HEART CATHETERIZATION WITH CORONARY ANGIOGRAM;  Surgeon: Lesleigh NoeHenry W Smith III, MD;  [facility];  Service: Cardiovascular;  Laterality: Bilateral;  . REFRACTIVE SURGERY  2010   left       Home Medications    Prior to Admission medications   Medication Sig Start Date End Date Taking? Authorizing Provider  albuterol (PROVENTIL HFA;VENTOLIN HFA) 108 (90 BASE) MCG/ACT inhaler Inhale 2 puffs into the lungs every 6 (six) hours as needed for wheezing. Reported on 05/04/2015   Yes Historical Provider, MD  aspirin EC 81 MG EC tablet Take 1 tablet (81 mg total) by mouth daily. 02/15/12  Yes Corky CraftsJayadeep S Varanasi, MD  atenolol (TENORMIN) 25 MG tablet TAKE 1 TABLET (25 MG TOTAL) BY MOUTH EVERY MORNING. 01/05/16  Yes Beatrice LecherScott T Weaver, PA-C  clopidogrel (PLAVIX) 75 MG tablet TAKE 1 TABLET (75 MG  TOTAL) BY MOUTH DAILY WITH BREAKFAST. 01/05/16  Yes Scott T Weaver, PA-C  COLCRYS 0.6 MG tablet Take 0.6 mg by mouth daily as needed for breakthrough pain. 04/10/16  Yes Historical Provider, MD  cyclobenzaprine (FLEXERIL) 10 MG tablet Take 1 tablet (10 mg total) by mouth 2 (two) times daily as needed for muscle spasms. 01/01/16  Yes Shaune Pollack, MD  finasteride (PROSCAR) 5 MG tablet Take 5 mg by mouth daily. Reported on 05/04/2015   Yes Historical Provider, MD  furosemide (LASIX) 40 MG tablet Take 40 mg by mouth 2 (two) times daily. 10/06/15  Yes Historical Provider, MD    HYDROcodone-acetaminophen (NORCO/VICODIN) 5-325 MG tablet Take 2 tablets by mouth every 6 (six) hours as needed for severe pain. 01/01/16  Yes Shaune Pollack, MD  metolazone (ZAROXOLYN) 2.5 MG tablet Take 2.5 mg by mouth 3 (three) times a week. Before taking Tuesday, Thursday, Saturday   Yes Historical Provider, MD  mometasone-formoterol (DULERA) 100-5 MCG/ACT AERO Inhale 2 puffs into the lungs 2 (two) times daily. 02/08/15  Yes Oretha Milch, MD  nabumetone (RELAFEN) 500 MG tablet Take 500 mg by mouth 2 (two) times daily as needed for pain. foot 04/10/16  Yes Historical Provider, MD  naproxen (NAPROSYN) 375 MG tablet Take 1 tablet (375 mg total) by mouth 2 (two) times daily as needed for moderate pain. 01/01/16  Yes Shaune Pollack, MD  nitroGLYCERIN (NITROSTAT) 0.4 MG SL tablet Place 0.4 mg under the tongue every 5 (five) minutes as needed. Reported on 05/04/2015   Yes Historical Provider, MD  sodium chloride (OCEAN) 0.65 % SOLN nasal spray Place 1 spray into both nostrils as needed for congestion. Reported on 05/04/2015   Yes Historical Provider, MD  tamsulosin (FLOMAX) 0.4 MG CAPS capsule Take 0.4 mg by mouth daily after breakfast. Reported on 05/04/2015 03/19/13  Yes Historical Provider, MD  methylPREDNISolone (MEDROL DOSEPAK) 4 MG TBPK tablet Use as per package Patient not taking: Reported on 05/01/2016 01/01/16   Shaune Pollack, MD    Family History Family History  Problem Relation Age of Onset  . Heart failure Mother   . Heart failure Brother     Social History Social History  Substance Use Topics  . Smoking status: Former Smoker    Years: 42.00    Types: Pipe    Quit date: 04/28/2011  . Smokeless tobacco: Never Used  . Alcohol use 3.5 oz/week    7 Standard drinks or equivalent per week     Comment: 02/14/2012 "weekends I have 6-7 mixed drinks"     occasional     Allergies   Lipitor [atorvastatin] and Statins   Review of Systems Review of Systems  Constitutional: Positive for  chills and fever (subjective).  Respiratory: Positive for cough and shortness of breath.   Cardiovascular: Negative for chest pain.  Gastrointestinal: Positive for nausea. Negative for abdominal pain and vomiting.  Genitourinary: Negative for dysuria, frequency and hematuria.  Neurological: Positive for weakness (generalized). Negative for dizziness.  All other systems reviewed and are negative.    Physical Exam Updated Vital Signs BP 106/66 (BP Location: Right Arm)   Pulse 65   Temp 97.9 F (36.6 C) (Oral)   Resp 16   SpO2 99%   Physical Exam  Constitutional: He appears well-developed and well-nourished. No distress.  HENT:  Head: Normocephalic and atraumatic.  Mouth/Throat: Uvula is midline. Mucous membranes are dry. No oropharyngeal exudate, posterior oropharyngeal edema or posterior oropharyngeal erythema.  Eyes: Conjunctivae are normal.  Neck:  Neck supple.  Cardiovascular: Normal rate, regular rhythm, normal heart sounds and intact distal pulses.   Pulmonary/Chest: He has decreased breath sounds. He has wheezes in the left lower field.  Increased work of breathing, but speaks in full sentences.  Abdominal: Soft. There is no tenderness. There is no guarding.  Musculoskeletal: He exhibits no edema.  Lymphadenopathy:    He has no cervical adenopathy.  Neurological: He is alert.  Skin: Skin is warm and dry. He is not diaphoretic.  Psychiatric: He has a normal mood and affect. His behavior is normal.  Nursing note and vitals reviewed.    ED Treatments / Results  Labs (all labs ordered are listed, but only abnormal results are displayed) Labs Reviewed  COMPREHENSIVE METABOLIC PANEL - Abnormal; Notable for the following:       Result Value   Chloride 98 (*)    Glucose, Bld 107 (*)    BUN 37 (*)    Creatinine, Ser 1.74 (*)    Calcium 8.8 (*)    Total Protein 6.4 (*)    AST 53 (*)    GFR calc non Af Amer 36 (*)    GFR calc Af Amer 42 (*)    All other components  within normal limits  CBC WITH DIFFERENTIAL/PLATELET - Abnormal; Notable for the following:    WBC 11.6 (*)    RDW 15.6 (*)    Neutro Abs 9.7 (*)    All other components within normal limits  INFLUENZA PANEL BY PCR (TYPE A & B) - Abnormal; Notable for the following:    Influenza A By PCR POSITIVE (*)    All other components within normal limits  I-STAT CG4 LACTIC ACID, ED - Abnormal; Notable for the following:    Lactic Acid, Venous 1.92 (*)    All other components within normal limits  CULTURE, BLOOD (ROUTINE X 2)  CULTURE, BLOOD (ROUTINE X 2)  URINE CULTURE  URINALYSIS, ROUTINE W REFLEX MICROSCOPIC  I-STAT CG4 LACTIC ACID, ED  I-STAT CG4 LACTIC ACID, ED    EKG  EKG Interpretation None       Radiology Dg Chest 2 View  Result Date: 05/01/2016 CLINICAL DATA:  Shortness of breath, fever, cold symptoms EXAM: CHEST  2 VIEW COMPARISON:  02/01/2015 FINDINGS: Cardiomediastinal silhouette is stable. No infiltrate or pleural effusion. No pulmonary edema. Subtle mild perihilar bronchitic changes. Degenerative changes thoracic spine. IMPRESSION: No infiltrate or pulmonary edema. Mild perihilar bronchitic changes. Degenerative changes thoracic spine. Electronically Signed   By: Natasha Mead M.D.   On: 05/01/2016 12:43    Procedures Procedures (including critical care time)  Medications Ordered in ED Medications  sodium chloride 0.9 % bolus 1,000 mL (0 mLs Intravenous Stopped 05/01/16 1426)    And  sodium chloride 0.9 % bolus 1,000 mL (0 mLs Intravenous Stopped 05/01/16 1426)    And  sodium chloride 0.9 % bolus 1,000 mL (1,000 mLs Intravenous Not Given 05/01/16 1322)  cefTRIAXone (ROCEPHIN) 1 g in dextrose 5 % 50 mL IVPB (not administered)  azithromycin (ZITHROMAX) 500 mg in dextrose 5 % 250 mL IVPB (not administered)  albuterol (PROVENTIL) (2.5 MG/3ML) 0.083% nebulizer solution 5 mg (5 mg Nebulization Given 05/01/16 1138)  ondansetron (ZOFRAN) injection 4 mg (4 mg Intravenous Given  05/01/16 1211)  acetaminophen (TYLENOL) tablet 650 mg (650 mg Oral Given 05/01/16 1322)  ipratropium-albuterol (DUONEB) 0.5-2.5 (3) MG/3ML nebulizer solution 3 mL (3 mLs Nebulization Given 05/01/16 1350)  vancomycin (VANCOCIN) IVPB 1000 mg/200 mL premix (0 mg  Intravenous Stopped 05/01/16 1526)  piperacillin-tazobactam (ZOSYN) IVPB 3.375 g (0 g Intravenous Stopped 05/01/16 1447)  methylPREDNISolone sodium succinate (SOLU-MEDROL) 125 mg/2 mL injection 125 mg (125 mg Intravenous Given 05/01/16 1551)     Initial Impression / Assessment and Plan / ED Course  I have reviewed the triage vital signs and the nursing notes.  Pertinent labs & imaging results that were available during my care of the patient were reviewed by me and considered in my medical decision making (see chart for details).  Clinical Course     Upon initial evaluation, patient's presentation is suspicion for possible sepsis as well as dehydration. Patient's afebrile state was suspected to be possibly due to his recent Tylenol use. Following patient's initial albuterol treatment, he voices some improvement but still endorses shortness of breath and states, "I have been working so hard to breathe, I think part of the issue is that I am tired." Air movement has improved. Once all the patient's lab results were returned and patient does not qualify as sepsis, I suspect COPD exacerbation. He continues to show increased work of breathing with a drop in SPO2 to 88-90% on room air with talking or exertion. Admission due to continued shortness of breath and work of breathing.   3:58 PM Spoke with Dr. Jarvis Newcomer, hospitalist, who agreed to admit the patient to MedSurg observation.  Findings and plan of care discussed with Linwood Dibbles, MD. Dr. Lynelle Doctor personally evaluated and examined this patient.   Vitals:   05/01/16 1139 05/01/16 1145 05/01/16 1200 05/01/16 1215  BP:  119/72 115/84 106/75  Pulse: 66 72 75 78  Resp: 16 17 18 18   Temp:        TempSrc:      SpO2: 97% 100% 97% 99%   Vitals:   05/01/16 1242 05/01/16 1242 05/01/16 1242 05/01/16 1437  BP:    114/62  Pulse:    92  Resp:    18  Temp: 99.7 F (37.6 C)     TempSrc: Rectal     SpO2:    95%  Weight:  95.3 kg 95.3 kg   Height:   6' (1.829 m)    Vitals:   05/01/16 1242 05/01/16 1242 05/01/16 1437 05/01/16 1556  BP:   114/62 114/76  Pulse:   92 85  Resp:   18 20  Temp:    100.6 F (38.1 C)  TempSrc:    Oral  SpO2:   95% 96%  Weight: 95.3 kg 95.3 kg    Height:  6' (1.829 m)       Final Clinical Impressions(s) / ED Diagnoses   Final diagnoses:  COPD exacerbation [facility])    New Prescriptions New Prescriptions   No medications on file     Anselm Pancoast, PA-C 05/01/16 1602    Anselm Pancoast, PA-C 05/01/16 1602    Linwood Dibbles, MD 05/02/16 2303

## 2016-05-01 NOTE — ED Triage Notes (Signed)
Patient reports productive cough, SOB, and body aches x3 days. Patient reports 3 episodes of diarrhea, but denies abdominal pain, N/V, and chest pain.

## 2016-05-01 NOTE — ED Notes (Signed)
O2 via [place] increased to 2L

## 2016-05-01 NOTE — ED Notes (Signed)
RN called Pts son, [name] with Pts approval to discuss Pt status at [phone].

## 2016-05-01 NOTE — Progress Notes (Signed)
PHARMACY NOTE:  ANTIMICROBIAL RENAL DOSAGE ADJUSTMENT  Current antimicrobial regimen includes a mismatch between antimicrobial dosage and estimated renal function.  As per policy approved by the Pharmacy & Therapeutics and Medical Executive Committees, the antimicrobial dosage will be adjusted accordingly.  Current antimicrobial dosage: Tamiflu 75 mg  Indication: Influenza A+  Renal Function:  Estimated Creatinine Clearance: 43.3 mL/min (by C-G formula based on SCr of 1.74 mg/dL (H)). []      On intermittent HD, scheduled: []      On CRRT    Antimicrobial dosage has been changed to:  30 mg bid  Additional comments:   Thank you for allowing pharmacy to be a part of this patient's care.  Yanelle Sousa A, [facility] CenterRPH 05/01/2016 4:53 PM

## 2016-05-01 NOTE — ED Provider Notes (Addendum)
Medical screening examination/treatment/procedure(s) were conducted as a shared visit with non-physician practitioner(s) and myself.  I personally evaluated the patient during the encounter.  Pt presented to the ED with persistent cough and congestion.  No improvement with breathing treatments and steroids.  O2 sat low 90s on rest.  Still wheezing on exam.  Will add on flu test.  Will admit for copd exacerbation.    Linwood DibblesJon Autumne Kallio, MD 05/01/16 1454  Flu test is positive.  Add on tamiflu.  Admission pending   Linwood DibblesJon Davonda Ausley, MD 05/01/16 [phone]

## 2016-05-01 NOTE — ED Notes (Signed)
Reminded patient that a urine sample is needed.

## 2016-05-01 NOTE — ED Notes (Signed)
RN contacted Pharm to discuss antibiotic orders and discontinued orders.

## 2016-05-01 NOTE — H&P (Signed)
History and Physical   Carlous Olivares ZOX:[id number] DOB: 09/25/1940 DOA: 05/01/2016  Referring MD/NP/PA: Linwood Dibbles, MD, EDP PCP: Duane Lope, MD  Patient coming from: Home  Chief Complaint: Dyspnea  HPI: Mitchell [name] is a 76 y.o. male with a history of COPD, CAD/NSTEMI, and HTN presenting with dyspnea and cough. He reports worsening cough with associated chest pain and sputum production. He also has significant dyspnea, weakness, muscle aches and fever. All symptoms have been constant and began abruptly 4 days ago. DayQuil and Tylenol have not helped. He has eaten poorly. No sick contacts. Not UTD on vaccinations for pneumonia or flu.  ED Course: On arrival he was saturating in the low 90's on room air at rest. He was noted to be wheezing, with minimal improvement from breathing treatments and still had increased work of breathing. Flu test was positive and tamiflu was started.   Review of Systems: No weight loss and per HPI. All others reviewed and are negative.   Past Medical History:  Diagnosis Date  . Anginal pain (HCC) 01/2012  . Anxiety   . Atherosclerotic heart disease of native coronary artery without angina pectoris   . BPH (benign prostatic hypertrophy)   . CAD (coronary artery disease) 2007   with LAD/Diag CBPTCA and kissing   . COPD (chronic obstructive pulmonary disease) (HCC)    "maybe" (02/14/2012)  . Coronary artery disease   . Depression   . GERD (gastroesophageal reflux disease)   . Hyperlipidemia   . Hypertension   . Hypoxemia   . Myocardial infarction 01/22/2012   NSTEMI with DES to RSA (PDA)  . Shortness of breath    "walking and laying down sometimes" (02/14/2012)    Past Surgical History:  Procedure Laterality Date  . CATARACT EXTRACTION W/ INTRAOCULAR LENS  IMPLANT, BILATERAL  ~ 2010  . CORONARY ANGIOPLASTY WITH STENT PLACEMENT  05/11/2005   PCI OF LAD  . GANGLION CYST REMOVED  1956?   right  . LEFT HEART CATHETERIZATION WITH CORONARY  ANGIOGRAM Bilateral 01/24/2012   Procedure: LEFT HEART CATHETERIZATION WITH CORONARY ANGIOGRAM;  Surgeon: Lesleigh Noe, MD;  Location: [facility] CATH LAB;  Service: Cardiovascular;  Laterality: Bilateral;  . REFRACTIVE SURGERY  2010   left   - Lives alone, former long-term pipe smoker. <1 EtOH drink per day on average. No illicit drug use.   Allergies  Allergen Reactions  . Lipitor [Atorvastatin] Other (See Comments)    "just paralyzed me; I can't take it"  . Statins Other (See Comments)    "paralyzed me; I can't take it"    Family History  Problem Relation Age of Onset  . Heart failure Mother   . Heart failure Brother    - Family history otherwise reviewed and not pertinent.  Prior to Admission medications   Medication Sig Start Date End Date Taking? Authorizing Provider  albuterol (PROVENTIL HFA;VENTOLIN HFA) 108 (90 BASE) MCG/ACT inhaler Inhale 2 puffs into the lungs every 6 (six) hours as needed for wheezing. Reported on 05/04/2015   Yes Historical Provider, MD  aspirin EC 81 MG EC tablet Take 1 tablet (81 mg total) by mouth daily. 02/15/12  Yes Corky Crafts, MD  atenolol (TENORMIN) 25 MG tablet TAKE 1 TABLET (25 MG TOTAL) BY MOUTH EVERY MORNING. 01/05/16  Yes Beatrice Lecher, PA-C  clopidogrel (PLAVIX) 75 MG tablet TAKE 1 TABLET (75 MG TOTAL) BY MOUTH DAILY WITH BREAKFAST. 01/05/16  Yes Scott T Weaver, PA-C  COLCRYS 0.6 MG  tablet Take 0.6 mg by mouth daily as needed for breakthrough pain. 04/10/16  Yes Historical Provider, MD  cyclobenzaprine (FLEXERIL) 10 MG tablet Take 1 tablet (10 mg total) by mouth 2 (two) times daily as needed for muscle spasms. 01/01/16  Yes Shaune Pollackameron Isaacs, MD  finasteride (PROSCAR) 5 MG tablet Take 5 mg by mouth daily. Reported on 05/04/2015   Yes Historical Provider, MD  furosemide (LASIX) 40 MG tablet Take 40 mg by mouth 2 (two) times daily. 10/06/15  Yes Historical Provider, MD  HYDROcodone-acetaminophen (NORCO/VICODIN) 5-325 MG tablet Take 2 tablets by  mouth every 6 (six) hours as needed for severe pain. 01/01/16  Yes Shaune Pollackameron Isaacs, MD  metolazone (ZAROXOLYN) 2.5 MG tablet Take 2.5 mg by mouth 3 (three) times a week. Before taking Tuesday, Thursday, Saturday   Yes Historical Provider, MD  mometasone-formoterol (DULERA) 100-5 MCG/ACT AERO Inhale 2 puffs into the lungs 2 (two) times daily. 02/08/15  Yes Oretha Milchakesh V Alva, MD  nabumetone (RELAFEN) 500 MG tablet Take 500 mg by mouth 2 (two) times daily as needed for pain. foot 04/10/16  Yes Historical Provider, MD  naproxen (NAPROSYN) 375 MG tablet Take 1 tablet (375 mg total) by mouth 2 (two) times daily as needed for moderate pain. 01/01/16  Yes Shaune Pollackameron Isaacs, MD  nitroGLYCERIN (NITROSTAT) 0.4 MG SL tablet Place 0.4 mg under the tongue every 5 (five) minutes as needed. Reported on 05/04/2015   Yes Historical Provider, MD  sodium chloride (OCEAN) 0.65 % SOLN nasal spray Place 1 spray into both nostrils as needed for congestion. Reported on 05/04/2015   Yes Historical Provider, MD  tamsulosin (FLOMAX) 0.4 MG CAPS capsule Take 0.4 mg by mouth daily after breakfast. Reported on 05/04/2015 03/19/13  Yes Historical Provider, MD  methylPREDNISolone (MEDROL DOSEPAK) 4 MG TBPK tablet Use as per package Patient not taking: Reported on 05/01/2016 01/01/16   Shaune Pollackameron Isaacs, MD    Physical Exam: Vitals:   05/01/16 1615 05/01/16 1630 05/01/16 1645 05/01/16 1700  BP: 107/56 102/57 (!) 89/72 97/62  Pulse: 87 87 89 86  Resp: 17 16 23 20   Temp:      TempSrc:      SpO2: 93% 95% 92% 93%  Weight:      Height:       Constitutional: 76 y.o. male in mild respiratory distress improved with oxygen Eyes: Lids and conjunctivae normal, PERRL ENMT: Mucous membranes are tacky. Posterior pharynx clear of any exudate or lesions. Poor dentition.  Neck: normal, supple, no masses, no thyromegaly Respiratory: Labored breathing on 2L by [place]. Diffuse end-expiratory wheezing and prolongation of expiration. Cardiovascular: Regular rate  and rhythm, no murmurs, rubs, or gallops. No carotid bruits. No JVD. No LE edema. 1+ pedal pulses. Abdomen: Normoactive bowel sounds. No tenderness, non-distended, and no masses palpated. No hepatosplenomegaly. GU: No indwelling catheter Musculoskeletal: No clubbing / cyanosis. Right MTP joint edematous and mildly erythematous, exquisitely tender to palpation. Full ROM, cap refill brisk, sensori-motor exam wnl. Skin: Warm, dry. No rashes. Neurologic: CN II-XII grossly intact. Gait not assessed. Speech normal, short setnences. No focal deficits in motor strength or sensation in all extremities.  Psychiatric: Alert and oriented x3. Normal judgment and insight. Mood normal with congruent affect.   Labs on Admission: I have personally reviewed following labs and imaging studies  CBC:  Recent Labs Lab 05/01/16 1153  WBC 11.6*  NEUTROABS 9.7*  HGB 15.0  HCT 45.3  MCV 88.0  PLT 177   Basic Metabolic Panel:  Recent Labs  Lab 05/01/16 1153  NA 141  K 3.5  CL 98*  CO2 32  GLUCOSE 107*  BUN 37*  CREATININE 1.74*  CALCIUM 8.8*   GFR: Estimated Creatinine Clearance: 43.3 mL/min (by C-G formula based on SCr of 1.74 mg/dL (H)). Liver Function Tests:  Recent Labs Lab 05/01/16 1153  AST 53*  ALT 45  ALKPHOS 73  BILITOT 0.8  PROT 6.4*  ALBUMIN 3.5   No results for input(s): LIPASE, AMYLASE in the last 168 hours. No results for input(s): AMMONIA in the last 168 hours. Coagulation Profile: No results for input(s): INR, PROTIME in the last 168 hours. Cardiac Enzymes: No results for input(s): CKTOTAL, CKMB, CKMBINDEX, TROPONINI in the last 168 hours. BNP (last 3 results) No results for input(s): PROBNP in the last 8760 hours. HbA1C: No results for input(s): HGBA1C in the last 72 hours. CBG: No results for input(s): GLUCAP in the last 168 hours. Lipid Profile: No results for input(s): CHOL, HDL, LDLCALC, TRIG, CHOLHDL, LDLDIRECT in the last 72 hours. Thyroid Function  Tests: No results for input(s): TSH, T4TOTAL, FREET4, T3FREE, THYROIDAB in the last 72 hours. Anemia Panel: No results for input(s): VITAMINB12, FOLATE, FERRITIN, TIBC, IRON, RETICCTPCT in the last 72 hours. Urine analysis:    Component Value Date/Time   COLORURINE YELLOW 05/01/2016 1432   APPEARANCEUR CLEAR 05/01/2016 1432   LABSPEC 1.019 05/01/2016 1432   PHURINE 5.0 05/01/2016 1432   GLUCOSEU NEGATIVE 05/01/2016 1432   HGBUR NEGATIVE 05/01/2016 1432   BILIRUBINUR NEGATIVE 05/01/2016 1432   KETONESUR NEGATIVE 05/01/2016 1432   PROTEINUR NEGATIVE 05/01/2016 1432   UROBILINOGEN 1.0 04/29/2011 1809   NITRITE NEGATIVE 05/01/2016 1432   LEUKOCYTESUR NEGATIVE 05/01/2016 1432   Sepsis Labs: @LABRCNTIP (procalcitonin:4,lacticidven:4) )No results found for this or any previous visit (from the past 240 hour(s)).   Radiological Exams on Admission: Dg Chest 2 View  Result Date: 05/01/2016 CLINICAL DATA:  Shortness of breath, fever, cold symptoms EXAM: CHEST  2 VIEW COMPARISON:  02/01/2015 FINDINGS: Cardiomediastinal silhouette is stable. No infiltrate or pleural effusion. No pulmonary edema. Subtle mild perihilar bronchitic changes. Degenerative changes thoracic spine. IMPRESSION: No infiltrate or pulmonary edema. Mild perihilar bronchitic changes. Degenerative changes thoracic spine. Electronically Signed   By: Natasha Mead M.D.   On: 05/01/2016 12:43    EKG: Independently reviewed. NSR. No ischemia. Do not agree that there are ST elevations in inferior leads.  Assessment/Plan Active Problems:   COPD exacerbation (HCC)   Acute severe COPD exacerbation due to influenza: Initially covered with CAP abx, but with no infiltrate on CXR and identified precipitant, will stop these. Has not been admitted with COPD, never intubated for it. Spirometry in the past showed moderate airway obstruction with FEV1 39%. - Given IV solumedrol and still having increased WOB, will continue this and wean to po  prednisone as indicated by improvement.  - Duonebs q6h / albuterol neb q1h prn - Continue ICS BID - Oxygen by nasal cannula to maintain SpO2 88 - 92% - Continue tamiflu x5 days - Recommend follow up with Dr. Vassie Loll (saw Oct 2016) - Per COPD gold protocol, consults to care management and social work have been placed  Acute kidney injury: SCr 1.74, presumed baseline 1.2 (last value from 2016). Due to dehydration/poor po intake.  - s/p 30cc/kg resuscitation in ED. Will continue IVF's and recheck BMP in AM - Holding lasix and metolazone. Last echo showed preserved EF and normal diastolic function.   Hypertension: Currently hypotensive. - Hold atenolol  CAD:  s/p DES. Has nonexertional chest pain most likely related to dyspnea/cough. - Cycle cardiac enzymes, recheck ECG in AM - Continue Plavix and aspirin - NTG prn  Hyperlipemia: Chronic, has statin allergy.  - Place patient on a heart healthy diet  History of depression: patient expresses feelings of being down secondary to decreased sight and ability to function around his home  Vision impairment: Per pt, worsened s/p cataract surgery and IOL by Dr. Hazle Quant. Reportedly 20/200 and stable. This will impact safety at discharge as he lives alone. - CSW consulted.   Acute gout flare: Started treatment as outpatient with colchicine.  - Should be treated with steroids for COPD as above (at least 5 days). Cr 1.74. No appreciable effusion for arthrocentesis/injection.  - Will hold NSAIDs for now - Check XR  DVT prophylaxis: Heparin  Code Status: Full  Family Communication: None at bedside Disposition Plan: DC to home once improved respiratory status Consults called: None  Admission status: Observation    Hazeline Junker, MD [facility] Pager [phone]  If 7PM-7AM, please contact night-coverage [email or web address] Password [facility] 05/01/2016, 5:23 PM

## 2016-05-02 DIAGNOSIS — N179 Acute kidney failure, unspecified: Secondary | ICD-10-CM | POA: Diagnosis present

## 2016-05-02 DIAGNOSIS — J9601 Acute respiratory failure with hypoxia: Secondary | ICD-10-CM | POA: Insufficient documentation

## 2016-05-02 DIAGNOSIS — F329 Major depressive disorder, single episode, unspecified: Secondary | ICD-10-CM | POA: Diagnosis present

## 2016-05-02 DIAGNOSIS — I252 Old myocardial infarction: Secondary | ICD-10-CM | POA: Diagnosis not present

## 2016-05-02 DIAGNOSIS — N400 Enlarged prostate without lower urinary tract symptoms: Secondary | ICD-10-CM | POA: Diagnosis present

## 2016-05-02 DIAGNOSIS — J101 Influenza due to other identified influenza virus with other respiratory manifestations: Secondary | ICD-10-CM | POA: Diagnosis present

## 2016-05-02 DIAGNOSIS — Z9841 Cataract extraction status, right eye: Secondary | ICD-10-CM | POA: Diagnosis not present

## 2016-05-02 DIAGNOSIS — E785 Hyperlipidemia, unspecified: Secondary | ICD-10-CM | POA: Diagnosis present

## 2016-05-02 DIAGNOSIS — J441 Chronic obstructive pulmonary disease with (acute) exacerbation: Principal | ICD-10-CM

## 2016-05-02 DIAGNOSIS — J111 Influenza due to unidentified influenza virus with other respiratory manifestations: Secondary | ICD-10-CM | POA: Diagnosis present

## 2016-05-02 DIAGNOSIS — Z8249 Family history of ischemic heart disease and other diseases of the circulatory system: Secondary | ICD-10-CM | POA: Diagnosis not present

## 2016-05-02 DIAGNOSIS — Z9842 Cataract extraction status, left eye: Secondary | ICD-10-CM | POA: Diagnosis not present

## 2016-05-02 DIAGNOSIS — I2510 Atherosclerotic heart disease of native coronary artery without angina pectoris: Secondary | ICD-10-CM | POA: Diagnosis present

## 2016-05-02 DIAGNOSIS — J1100 Influenza due to unidentified influenza virus with unspecified type of pneumonia: Secondary | ICD-10-CM | POA: Diagnosis not present

## 2016-05-02 DIAGNOSIS — Z7902 Long term (current) use of antithrombotics/antiplatelets: Secondary | ICD-10-CM | POA: Diagnosis not present

## 2016-05-02 DIAGNOSIS — I10 Essential (primary) hypertension: Secondary | ICD-10-CM

## 2016-05-02 DIAGNOSIS — M109 Gout, unspecified: Secondary | ICD-10-CM | POA: Diagnosis present

## 2016-05-02 DIAGNOSIS — H547 Unspecified visual loss: Secondary | ICD-10-CM | POA: Diagnosis present

## 2016-05-02 DIAGNOSIS — Z7982 Long term (current) use of aspirin: Secondary | ICD-10-CM | POA: Diagnosis not present

## 2016-05-02 DIAGNOSIS — Z888 Allergy status to other drugs, medicaments and biological substances status: Secondary | ICD-10-CM | POA: Diagnosis not present

## 2016-05-02 DIAGNOSIS — Z955 Presence of coronary angioplasty implant and graft: Secondary | ICD-10-CM | POA: Diagnosis not present

## 2016-05-02 DIAGNOSIS — E860 Dehydration: Secondary | ICD-10-CM | POA: Diagnosis present

## 2016-05-02 DIAGNOSIS — Z79899 Other long term (current) drug therapy: Secondary | ICD-10-CM | POA: Diagnosis not present

## 2016-05-02 DIAGNOSIS — Z7951 Long term (current) use of inhaled steroids: Secondary | ICD-10-CM | POA: Diagnosis not present

## 2016-05-02 DIAGNOSIS — Z961 Presence of intraocular lens: Secondary | ICD-10-CM | POA: Diagnosis present

## 2016-05-02 DIAGNOSIS — F17290 Nicotine dependence, other tobacco product, uncomplicated: Secondary | ICD-10-CM | POA: Diagnosis present

## 2016-05-02 DIAGNOSIS — I959 Hypotension, unspecified: Secondary | ICD-10-CM | POA: Diagnosis present

## 2016-05-02 LAB — URINE CULTURE: Culture: NO GROWTH

## 2016-05-02 LAB — CBC
HEMATOCRIT: 40.1 % (ref 39.0–52.0)
HEMOGLOBIN: 13.4 g/dL (ref 13.0–17.0)
MCH: 29.4 pg (ref 26.0–34.0)
MCHC: 33.4 g/dL (ref 30.0–36.0)
MCV: 87.9 fL (ref 78.0–100.0)
Platelets: 129 10*3/uL — ABNORMAL LOW (ref 150–400)
RBC: 4.56 MIL/uL (ref 4.22–5.81)
RDW: 15.8 % — AB (ref 11.5–15.5)
WBC: 20 10*3/uL — AB (ref 4.0–10.5)

## 2016-05-02 LAB — BASIC METABOLIC PANEL
ANION GAP: 8 (ref 5–15)
BUN: 30 mg/dL — ABNORMAL HIGH (ref 6–20)
CO2: 24 mmol/L (ref 22–32)
Calcium: 7.7 mg/dL — ABNORMAL LOW (ref 8.9–10.3)
Chloride: 104 mmol/L (ref 101–111)
Creatinine, Ser: 1.63 mg/dL — ABNORMAL HIGH (ref 0.61–1.24)
GFR, EST AFRICAN AMERICAN: 46 mL/min — AB (ref >60)
GFR, EST NON AFRICAN AMERICAN: 39 mL/min — AB (ref >60)
Glucose, Bld: 273 mg/dL — ABNORMAL HIGH (ref 65–99)
POTASSIUM: 3.8 mmol/L (ref 3.5–5.1)
Sodium: 136 mmol/L (ref 135–145)

## 2016-05-02 LAB — TROPONIN I
Troponin I: <0.03 ng/mL (ref <0.03)
Troponin I: <0.03 ng/mL (ref <0.03)

## 2016-05-02 MED ORDER — SENNOSIDES-DOCUSATE SODIUM 8.6-50 MG PO TABS
2.0000 | ORAL_TABLET | Freq: Two times a day (BID) | ORAL | Status: DC
  Administered 2016-05-02 – 2016-05-03 (×3): 2 via ORAL
  Filled 2016-05-02 (×3): qty 2

## 2016-05-02 MED ORDER — POLYETHYLENE GLYCOL 3350 17 G PO PACK
17.0000 g | PACK | Freq: Two times a day (BID) | ORAL | Status: DC
  Administered 2016-05-02 – 2016-05-03 (×3): 17 g via ORAL
  Filled 2016-05-02 (×3): qty 1

## 2016-05-02 MED ORDER — ATENOLOL 25 MG PO TABS
25.0000 mg | ORAL_TABLET | Freq: Every morning | ORAL | Status: DC
  Administered 2016-05-02 – 2016-05-03 (×2): 25 mg via ORAL
  Filled 2016-05-02 (×2): qty 1

## 2016-05-02 MED ORDER — IPRATROPIUM-ALBUTEROL 0.5-2.5 (3) MG/3ML IN SOLN
3.0000 mL | RESPIRATORY_TRACT | Status: DC
  Administered 2016-05-02 – 2016-05-03 (×6): 3 mL via RESPIRATORY_TRACT
  Filled 2016-05-02 (×6): qty 3

## 2016-05-02 MED ORDER — GUAIFENESIN-DM 100-10 MG/5ML PO SYRP
5.0000 mL | ORAL_SOLUTION | ORAL | Status: DC | PRN
  Administered 2016-05-02 – 2016-05-03 (×2): 5 mL via ORAL
  Filled 2016-05-02 (×2): qty 10

## 2016-05-02 MED ORDER — DM-GUAIFENESIN ER 30-600 MG PO TB12
1.0000 | ORAL_TABLET | Freq: Two times a day (BID) | ORAL | Status: DC
  Administered 2016-05-02 – 2016-05-03 (×3): 1 via ORAL
  Filled 2016-05-02 (×3): qty 1

## 2016-05-02 NOTE — Progress Notes (Signed)
[facility] PROGRESS NOTE    Progress Note  Mitchell [name]  ONG:[id number] DOB: 05-10-1940 DOA: 05/01/2016 PCP: Duane LopeAlan Ross, MD     Brief Narrative:   Mitchell BossierJarald Dean Drozdowski is an 76 y.o. male  with a history of COPD, CAD/NSTEMI, and HTN presenting with dyspnea and cough. He reports worsening cough with associated chest pain and sputum production. He also has significant dyspnea, weakness, muscle aches and fever.  Assessment/Plan:   Acute severe COPD exacerbation due to influenza:  - Spirometry in the past showed moderate airway obstruction with FEV1 39%. - cont IV solumedrol and Inhalers. - Oxygen by nasal cannula to maintain SpO2 88 - 92% - Continue tamiflu x5 days - Per COPD gold protocol, consults to care management and social work have been placed. At this time for cough as is complaining of rib cage pain with coughing.  Acute kidney injury:  On admission cr. 1.74, presumed baseline 1.2 (last value from 2016). Due to dehydration/poor po intake.  - improve with IV hydration. - Continue to hold lasix and metolazone.  Hypertension:  Currently hypotensive. Resume atenolol.  CAD:  - Cardiac markers negative 3. - Continue Plavix and aspirin - NTG prn  Hyperlipemia: - Chronic, has statin allergy.  - Place patient on a heart healthy diet  History of depression:  Patient expresses feelings of being down secondary to decreased sight and ability to function. No suicidal thoughts continue current regimen.  Vision impairment:  This is impacting safety at discharge as he lives alone.  Acute gout flare:  - Should be treated with steroids for COPD as above (at least 5 days). Cr 1.74. No appreciable effusion for arthrocentesis/injection.  - Right great toe X-ray showed tufaceous information   DVT prophylaxis: lovenox Family Communication:none Disposition Plan/Barrier to D/C: home in 2-3 days Code Status:     Code Status Orders        Start     Ordered     05/01/16 2005  Full code  Continuous     05/01/16 2004    Code Status History    Date Active Date Inactive Code Status Order ID Comments User Context   01/31/2015 12:41 PM 02/03/2015  5:21 PM Full Code [id number]  Clydie Braunondell A Smith, MD ED   02/14/2012  7:39 PM 02/15/2012  5:34 PM Full Code [id number]  Othella BoyerWilliam S Tilley, MD ED   04/30/2011 12:16 AM 05/10/2011  5:40 PM Full Code [id number]  Leonides SchanzAnna B Shell, RN ED    Advance Directive Documentation   Flowsheet Row Most Recent Value  Type of Advance Directive  Living will, Healthcare Power of Attorney  Pre-existing out of facility DNR order (yellow form or pink MOST form)  No data  "MOST" Form in Place?  No data        IV Access:    Peripheral IV   Procedures and diagnostic studies:   Dg Chest 2 View  Result Date: 05/01/2016 CLINICAL DATA:  Shortness of breath, fever, cold symptoms EXAM: CHEST  2 VIEW COMPARISON:  02/01/2015 FINDINGS: Cardiomediastinal silhouette is stable. No infiltrate or pleural effusion. No pulmonary edema. Subtle mild perihilar bronchitic changes. Degenerative changes thoracic spine. IMPRESSION: No infiltrate or pulmonary edema. Mild perihilar bronchitic changes. Degenerative changes thoracic spine. Electronically Signed   By: Natasha MeadLiviu  Pop M.D.   On: 05/01/2016 12:43   Dg Toe Great Right  Result Date: 05/01/2016 CLINICAL DATA:  Pt c/o diffuse RIGHT great toe pain, swelling, denies inj, admitted for + flu today, reports  hx known gout, arthritis to extremities. EXAM: RIGHT GREAT TOE COMPARISON:  None. FINDINGS: Osseous alignment is normal. Bone mineralization is normal. No fracture line or displaced fracture fragment identified. No acute or suspicious osseous lesion. No erosions or other confirming signs of an inflammatory arthritis. There is, however, a subtle density adjacent to the distal margin of the right first metatarsal bone which could represent early/mild tophus formation. IMPRESSION: 1. Subtle small density within the  soft tissues immediately adjacent to the distal margin of the right first metatarsal bone which may represent early/mild tophus formation related to patient's known gout. There are, however, no erosions or other confirming signs of an inflammatory arthritis. 2. No acute findings.  No fracture or dislocation. Electronically Signed   By: Bary Richard M.D.   On: 05/01/2016 20:56     Medical Consultants:    None.  Anti-Infectives:    Tamiflu Subjective:    Mitchell [name] he relates pain with coughing, he relates is not like his previous coronary syndromes. He relates with these coughing spells he also developed mild shortness of breath.  Objective:    Vitals:   05/01/16 2144 05/02/16 0458 05/02/16 0823 05/02/16 0824  BP:  110/78    Pulse:  64    Resp:  20    Temp:  98.3 F (36.8 C)    TempSrc:  Oral    SpO2: 94% 97% 97% 97%  Weight:      Height:        Intake/Output Summary (Last 24 hours) at 05/02/16 0830 Last data filed at 05/02/16 0700  Gross per 24 hour  Intake          1746.67 ml  Output              850 ml  Net           896.67 ml   Filed Weights   05/01/16 1242 05/01/16 1242 05/01/16 2011  Weight: 95.3 kg (210 lb) 95.3 kg (210 lb) 101.6 kg (223 lb 15.8 oz)    Exam: General exam: In no acute distress. Respiratory system: Good air movement and clear to auscultation. Cardiovascular system: S1 & S2 heard, RRR. No JVD. Gastrointestinal system: Abdomen is nondistended, soft and nontender.  Extremities: No pedal edema. Skin: No rashes, lesions or ulcers Psychiatry: Judgement and insight appear normal. Mood & affect appropriate.    Data Reviewed:    Labs: Basic Metabolic Panel:  Recent Labs Lab 05/01/16 1153 05/01/16 2038 05/02/16 0146  NA 141  --  136  K 3.5  --  3.8  CL 98*  --  104  CO2 32  --  24  GLUCOSE 107*  --  273*  BUN 37*  --  30*  CREATININE 1.74* 1.59* 1.63*  CALCIUM 8.8*  --  7.7*   GFR Estimated Creatinine Clearance: 47.6  mL/min (by C-G formula based on SCr of 1.63 mg/dL (H)). Liver Function Tests:  Recent Labs Lab 05/01/16 1153  AST 53*  ALT 45  ALKPHOS 73  BILITOT 0.8  PROT 6.4*  ALBUMIN 3.5   No results for input(s): LIPASE, AMYLASE in the last 168 hours. No results for input(s): AMMONIA in the last 168 hours. Coagulation profile No results for input(s): INR, PROTIME in the last 168 hours.  CBC:  Recent Labs Lab 05/01/16 1153 05/01/16 2038 05/02/16 0146  WBC 11.6* 17.3* 20.0*  NEUTROABS 9.7*  --   --   HGB 15.0 13.3 13.4  HCT 45.3 39.9 40.1  MCV 88.0 86.6 87.9  PLT 177 151 129*   Cardiac Enzymes:  Recent Labs Lab 05/01/16 2038 05/02/16 0200  TROPONINI <0.03 <0.03   BNP (last 3 results) No results for input(s): PROBNP in the last 8760 hours. CBG: No results for input(s): GLUCAP in the last 168 hours. D-Dimer: No results for input(s): DDIMER in the last 72 hours. Hgb A1c: No results for input(s): HGBA1C in the last 72 hours. Lipid Profile: No results for input(s): CHOL, HDL, LDLCALC, TRIG, CHOLHDL, LDLDIRECT in the last 72 hours. Thyroid function studies: No results for input(s): TSH, T4TOTAL, T3FREE, THYROIDAB in the last 72 hours.  Invalid input(s): FREET3 Anemia work up: No results for input(s): VITAMINB12, FOLATE, FERRITIN, TIBC, IRON, RETICCTPCT in the last 72 hours. Sepsis Labs:  Recent Labs Lab 05/01/16 1153 05/01/16 1204 05/01/16 1606 05/01/16 2038 05/02/16 0146  WBC 11.6*  --   --  17.3* 20.0*  LATICACIDVEN  --  1.92* 1.77  --   --    Microbiology No results found for this or any previous visit (from the past 240 hour(s)).   Medications:   . aspirin EC  81 mg Oral Daily  . clopidogrel  75 mg Oral Q breakfast  . finasteride  5 mg Oral Daily  . heparin  5,000 Units Subcutaneous Q8H  . ipratropium-albuterol  3 mL Nebulization Q4H  . methylPREDNISolone (SOLU-MEDROL) injection  125 mg Intravenous Q12H  . mometasone-formoterol  2 puff Inhalation BID    . oseltamivir  30 mg Oral BID  . sodium chloride  1,000 mL Intravenous Once  . tamsulosin  0.4 mg Oral QPC breakfast   Continuous Infusions: . sodium chloride 100 mL/hr at 05/02/16 1610    Time spent: 25 min   LOS: 0 days   Marinda Elk  [facility] Pager [phone]  *Please refer to [email or web address], password [email or web address] to get updated schedule on who will round on this patient, as hospitalists switch teams weekly. If 7PM-7AM, please contact night-coverage at [email or web address], password [email or web address] for any overnight needs.  05/02/2016, 8:30 AM

## 2016-05-02 NOTE — Progress Notes (Signed)
Inpatient Diabetes Program Recommendations  AACE/ADA: New Consensus Statement on Inpatient Glycemic Control (2015)  Target Ranges:  Prepandial:   less than 140 mg/dL      Peak postprandial:   less than 180 mg/dL (1-2 hours)      Critically ill patients:  140 - 180 mg/dL   Results for Mitchell [name], [name] (MRN [id number]) as of 05/02/2016 09:10  Ref. Range 05/02/2016 01:46  Glucose Latest Ref Range: 65 - 99 mg/dL [id number] (H)     Admit: Acute severe COPD exacerbation due to influenza  NO History of Diabetes noted     MD- Note patient currently receiving Solumedrol 125 mg BID.  Lab glucose elevated to 273 mg/dl this AM.  Please consider placing orders for CBG checks and Novolog Moderate Correction Scale/ SSI (0-15 units) TID AC + HS      --Will follow patient during hospitalization--  Ambrose FinlandJeannine Johnston Tajai Suder RN, MSN, CDE Diabetes Coordinator Inpatient Glycemic Control Team Team Pager: [phone] (8a-5p)

## 2016-05-02 NOTE — Care Management Note (Signed)
Case Management Note  Patient Details  Name: Mitchell [name] MRN: [id number] Date of Birth: December 06, 1940  Subjective/Objective:  76 y/o m admitted w/COPD. Hx: legally blind. From home. Patient states he needs help w/blind resources-recc HH social worker, HHPT. [facility] Await HHPT,HH social worker order,[id number].                  Action/Plan:d/c plan home w/HHC.   Expected Discharge Date:   (unknown)               Expected Discharge Plan:  Home w Home Health Services  In-House Referral:     Discharge planning Services  CM Consult  Post Acute Care Choice:    Choice offered to:  Patient  DME Arranged:    DME Agency:     HH Arranged:    HH Agency:     Status of Service:  In process, will continue to follow  If discussed at Long Length of Stay Meetings, dates discussed:    Additional Comments:  Lanier ClamMahabir, Paitynn Mikus, RN 05/02/2016, 12:07 PM

## 2016-05-02 NOTE — Care Management Obs Status (Signed)
MEDICARE OBSERVATION STATUS NOTIFICATION   Patient Details  Name: Mitchell [name] MRN: [name] Date of Birth: 05-17-1940   Medicare Observation Status Notification Given:  Yes    [name], Mitchell Najarro, RN 05/02/2016, 12:06 PM

## 2016-05-02 NOTE — Progress Notes (Signed)
Initial Nutrition Assessment  DOCUMENTATION CODES:   Obesity unspecified  INTERVENTION:  - Continue to encourage PO intakes. - RD will continue to monitor for additional needs.  NUTRITION DIAGNOSIS:   Inadequate oral intake related to poor appetite as evidenced by per patient/family report.  GOAL:   Patient will meet greater than or equal to 90% of their needs  MONITOR:   PO intake, Weight trends, Labs, I & O's  REASON FOR ASSESSMENT:   Consult COPD Protocol  ASSESSMENT:   76 y.o. male with a history of COPD, CAD/NSTEMI, and HTN presenting with dyspnea and cough. He reports worsening cough with associated chest pain and sputum production. He also has significant dyspnea, weakness, muscle aches and fever. All symptoms have been constant and began abruptly 4 days ago. DayQuil and Tylenol have not helped. He has eaten poorly. No sick contacts. Not UTD on vaccinations for pneumonia or flu.  Pt seen for COPD protocol assessment. BMI indicates obesity. Per chart review, pt consumed 100% of breakfast this AM which pt confirms. He states that lunch is already ordered but that he would like chicken salad sandwich and grapes as a snack today between meals. He states that PTA he was having ongoing pain d/t excessive coughing and that this led to decreased appetite and intakes for 3-4 days. Prior to this he had a good appetite. He also reports no BM during this time frame. Pt denies abdominal pain or nausea now or PTA and that pain is mainly in his chest area.   Physical assessment shows mild edema to BLE and no muscle and no fat wasting. Per chart review, last weight was in July 2017 and weight has been trending up 11 lbs since that time; will continue to monitor weight trends during hospitalization.   Medications reviewed; 125 mg IV Solu-medrol BID, PRN IV Zofran, 17 g Miralax BID, 2 tablets Senokot BID. Labs reviewed; BUN: 30 mg/dL, creatinine: 8.651.63 mg/dL, Ca: 7.7 mg/dL, GFR: 39 mL/min.    IVF: NS @ 100 mL/hr.    Diet Order:  Diet Heart Room service appropriate? Yes; Fluid consistency: Thin  Skin:  Reviewed, no issues  Last BM:  PTA  Height:   Ht Readings from Last 1 Encounters:  05/01/16 6' (1.829 m)    Weight:   Wt Readings from Last 1 Encounters:  05/01/16 223 lb 15.8 oz (101.6 kg)    Ideal Body Weight:  80.91 kg  BMI:  Body mass index is 30.38 kg/m.  Estimated Nutritional Needs:   Kcal:  1525-1725 (15-17 kcal/kg)  Protein:  65-75 grams  Fluid:  1.5-1.7 L/day  EDUCATION NEEDS:   No education needs identified at this time    Trenton GammonJessica Arlander Gillen, MS, RD, LDN, CNSC Inpatient Clinical Dietitian Pager # [phone] After hours/weekend pager # [phone]

## 2016-05-02 NOTE — Evaluation (Signed)
Physical Therapy Evaluation Patient Details Name: Mitchell [name] MRN: [id number] DOB: 02/26/1941 Today's Date: 05/02/2016   History of Present Illness  76 yo male admitted with COPD exac, acute gout flare. Hx of COPD, NSTEMI, CAD, HTN, COPD  Clinical Impression  On eval, pt required Min assist for mobility. He walked ~115 feet with a RW. O2 sats 91% on RA. Dyspnea 2/4. Pt presents with general weakness, decreased activity tolerance, and impaired gait and balance. Will follow and progress activity as tolerated.     Follow Up Recommendations Home health PT    Equipment Recommendations  Rolling walker with 5" wheels    Recommendations for Other Services OT consult     Precautions / Restrictions Precautions Precautions: Fall Precaution Comments: visual deficits per pt Restrictions Weight Bearing Restrictions: No      Mobility  Bed Mobility Overal bed mobility: Modified Independent                Transfers Overall transfer level: Needs assistance Equipment used: 1 person hand held assist Transfers: Sit to/from Stand Sit to Stand: Min assist         General transfer comment: Assist to rise, stabilize. Handheld assist provided.   Ambulation/Gait Ambulation/Gait assistance: Min guard Ambulation Distance (Feet): 115 Feet Assistive device: Rolling walker (2 wheeled) Gait Pattern/deviations: Step-through pattern;Decreased stride length     General Gait Details: close guard for safety.   Stairs            Wheelchair Mobility    Modified Rankin (Stroke Patients Only)       Balance Overall balance assessment: Needs assistance           Standing balance-Leahy Scale: Poor Standing balance comment: required RW on today                             Pertinent Vitals/Pain Pain Assessment: 0-10 Pain Score: 5  Pain Location: chest from coughing; headache Pain Intervention(s): Monitored during session    Home Living Family/patient  expects to be discharged to:: Private residence Living Arrangements: Alone   Type of Home: Apartment Home Access: Stairs to enter   Entergy CorporationEntrance Stairs-Number of Steps: 1 Home Layout: One level Home Equipment: Cane - single point;Grab bars - tub/shower;Grab bars - toilet      Prior Function Level of Independence: Independent with assistive device(s)         Comments: uses cane for ambulation     Hand Dominance        Extremity/Trunk Assessment   Upper Extremity Assessment Upper Extremity Assessment: Defer to OT evaluation    Lower Extremity Assessment Lower Extremity Assessment: Generalized weakness    Cervical / Trunk Assessment Cervical / Trunk Assessment: Normal  Communication   Communication: No difficulties  Cognition Arousal/Alertness: Awake/alert Behavior During Therapy: WFL for tasks assessed/performed Overall Cognitive Status: Within Functional Limits for tasks assessed                      General Comments      Exercises     Assessment/Plan    PT Assessment Patient needs continued PT services  PT Problem List Decreased mobility;Decreased activity tolerance;Decreased balance;Decreased knowledge of use of DME;Pain;Decreased strength          PT Treatment Interventions DME instruction;Gait training;Therapeutic activities;Therapeutic exercise;Patient/family education;Functional mobility training;Balance training    PT Goals (Current goals can be found in the Care Plan section)  Acute Rehab PT  Goals Patient Stated Goal: to get better PT Goal Formulation: With patient Time For Goal Achievement: 05/16/16 Potential to Achieve Goals: Good    Frequency Min 3X/week   Barriers to discharge        Co-evaluation               End of Session Equipment Utilized During Treatment: Gait belt Activity Tolerance: Patient tolerated treatment well Patient left:  (with OT in room closing out session)           Time: 1030-1100 PT Time  Calculation (min) (ACUTE ONLY): 30 min   Charges:   PT Evaluation $PT Eval Low Complexity: 1 Procedure PT Treatments $Gait Training: 8-22 mins   PT G Codes:        Rebeca Alert, MPT Pager: [phone]

## 2016-05-02 NOTE — Evaluation (Signed)
Occupational Therapy Evaluation Patient Details Name: Mitchell [name] MRN: [id number] DOB: 1940/11/15 Today's Date: 05/02/2016    History of Present Illness 76 yo male admitted with COPD exac, acute gout flare. Hx of COPD, NSTEMI, CAD, HTN, COPD   Clinical Impression   Pt admitted with COPD exacerbation. Pt currently with functional limitations due to the deficits listed below (see OT Problem List).  Pt will benefit from skilled OT to increase their safety and independence with ADL and functional mobility for ADL to facilitate discharge to venue listed below.               Precautions / Restrictions Precautions Precautions: Fall Precaution Comments: visual deficits per pt Restrictions Weight Bearing Restrictions: No      Mobility Bed Mobility Overal bed mobility: Modified Independent                Transfers Overall transfer level: Needs assistance Equipment used: 1 person hand held assist Transfers: Sit to/from Stand Sit to Stand: Min assist         General transfer comment: Assist to rise, stabilize. Handheld assist provided.     Balance Overall balance assessment: Needs assistance           Standing balance-Leahy Scale: Poor Standing balance comment: required RW on today                            ADL Overall ADL's : Needs assistance/impaired Eating/Feeding: Supervision/ safety;Sitting   Grooming: Minimal assistance;Standing   Upper Body Bathing: Set up;Sitting   Lower Body Bathing: Moderate assistance;Sit to/from stand   Upper Body Dressing : Set up;Sitting   Lower Body Dressing: Moderate assistance;Sit to/from stand;Cueing for sequencing                 General ADL Comments: Pt not able to get socks on stating he had a way he did it at home. Pt would benefit from AE- will educate next day     Vision     Perception     Praxis      Pertinent Vitals/Pain Pain Score: 4  Pain Location: headache Pain  Intervention(s): Monitored during session;Repositioned     Hand Dominance     Extremity/Trunk Assessment Upper Extremity Assessment Upper Extremity Assessment: Generalized weakness           Communication Communication Communication: No difficulties   Cognition Arousal/Alertness: Awake/alert Behavior During Therapy: WFL for tasks assessed/performed Overall Cognitive Status: Within Functional Limits for tasks assessed                                Home Living Family/patient expects to be discharged to:: Private residence Living Arrangements: Alone   Type of Home: Apartment Home Access: Stairs to enter [employer] CorporationEntrance Stairs-Number of Steps: 1   Home Layout: One level     Bathroom Shower/Tub: Chief Strategy OfficerTub/shower unit   Bathroom Toilet: Handicapped height     Home Equipment: Cane - single point;Grab bars - tub/shower;Grab bars - toilet          Prior Functioning/Environment Level of Independence: Independent with assistive device(s)        Comments: uses cane for ambulation        OT Problem List: Decreased strength;Decreased activity tolerance;Decreased safety awareness   OT Treatment/Interventions: Self-care/ADL training;DME and/or AE instruction;Patient/family education    OT Goals(Current goals can be found in the care  plan section) Acute Rehab OT Goals OT Goal Formulation: With patient Time For Goal Achievement: 05/09/16 Potential to Achieve Goals: Good ADL Goals Pt Will Perform Grooming: with modified independence;standing Pt Will Perform Lower Body Dressing: with modified independence;sit to/from stand Pt Will Transfer to Toilet: with modified independence;ambulating Pt Will Perform Toileting - Clothing Manipulation and hygiene: with modified independence;sit to/from stand  OT Frequency: Min 2X/week   Barriers to D/C:               End of Session Equipment Utilized During Treatment: Engineer, water Communication: Mobility  status  Activity Tolerance: Patient limited by fatigue Patient left: in bed;with bed alarm set   Charges:  OT General Charges $OT Visit: 1 Procedure OT Evaluation $OT Eval Moderate Complexity: 1 Procedure G-Codes:    Einar Crow D 05-23-2016, 3:54 PM

## 2016-05-03 DIAGNOSIS — J1100 Influenza due to unidentified influenza virus with unspecified type of pneumonia: Secondary | ICD-10-CM

## 2016-05-03 MED ORDER — POLYETHYLENE GLYCOL 3350 17 G PO PACK: 17.0000 g | PACK | Freq: Two times a day (BID) | ORAL | 0 refills | Status: DC

## 2016-05-03 MED ORDER — OSELTAMIVIR PHOSPHATE 30 MG PO CAPS: 30.0000 mg | ORAL_CAPSULE | Freq: Two times a day (BID) | ORAL | 0 refills | Status: DC

## 2016-05-03 MED ORDER — SORBITOL 70 % SOLN
30.0000 mL | Freq: Once | Status: AC
  Administered 2016-05-03: 30 mL via ORAL
  Filled 2016-05-03: qty 30

## 2016-05-03 MED ORDER — SORBITOL 70 % SOLN
960.0000 mL | TOPICAL_OIL | Freq: Once | ORAL | Status: DC | PRN
  Filled 2016-05-03: qty 240

## 2016-05-03 MED ORDER — PREDNISONE 10 MG PO TABS: ORAL_TABLET | ORAL | 0 refills | Status: DC

## 2016-05-03 NOTE — Care Management Note (Signed)
Case Management Note  Patient Details  Name: Mitchell [name] MRN: [id number] Date of Birth: [birth year]  Subjective/Objective:PT-recc HHPT-MD notified. HH orders-HH aide,social worker-[facility]-rep Edwinna aware of d/c, & orders.DME rw-[facility] dme rep Carollee HerterShannon aware-will get rw from closet.Ambulance transp needed @ d/c-confirmed address, patietn states he has a key to get in-forms in shadow chart-Nsg will manage calling [facility] when ready for transport. No further CM needs.                    Action/Plan:d/c home w/HHC/dme/ambulance    Expected Discharge Date:  05/03/16               Expected Discharge Plan:  Home w Home Health Services  In-House Referral:     Discharge planning Services  CM Consult  Post Acute Care Choice:    Choice offered to:  Patient  DME Arranged:  Walker rolling DME Agency:  [facility]  HH Arranged:  PT, Nurse's Aide, Social Work HH Agency[facility]  [facility]  Status of Service:  Completed, signed off  If discussed at [employer] Length of Stay Meetings, dates discussed:    Additional Comments:  Lanier ClamMahabir, Paulette [place], RN 05/03/2016, 11:22 AM

## 2016-05-03 NOTE — Progress Notes (Signed)
SATURATION QUALIFICATIONS: (This note is used to comply with regulatory documentation for home oxygen)  Patient Saturations on Room Air at Rest = 92%  Patient Saturations on Room Air while Ambulating = 85%  Patient Saturations on 2 Liters of oxygen while Ambulating = 96%  Please briefly explain why patient needs home oxygen: Oxygen saturation while ambulating dropped to 85%.

## 2016-05-03 NOTE — Care Management Note (Signed)
Case Management Note  Patient Details  Name: Mitchell [name] MRN: [id number] Date of Birth: 06-15-1940  Subjective/Objective:CM TC [facility] spoke to director about patient needing transport home via [facility]-vision impaired,gait,endurance,& balance impaired-they will transport home via [facility].                    Action/Plan:d/c home w/HHC/rw/[facility]   Expected Discharge Date:  05/03/16               Expected Discharge Plan:  Home w Home Health Services  In-House Referral:     Discharge planning Services  CM Consult  Post Acute Care Choice:    Choice offered to:  Patient  DME Arranged:  Walker rolling, Oxygen DME Agency:  [facility]  HH Arranged:  PT, Nurse's Aide, Social Work, OT [facility]  Status of Service:  Completed, signed off  If discussed at [employer] Length of [employer] Meetings, dates discussed:    Additional Comments:  Lanier ClamMahabir, Mitchell Zheng, RN 05/03/2016, 2:14 PM

## 2016-05-03 NOTE — Discharge Summary (Signed)
Physician Discharge Summary  Mitchell [name] ZOX:[id number] DOB: [birth year] DOA: 05/01/2016  PCP: Duane Lope, MD  Admit date: 05/01/2016 Discharge date: 05/03/2016  Admitted From: Home Disposition:  Home  Recommendations for Outpatient Follow-up:  1. Home with a walker. 2. Follow up with PCP in 1 week.  Home Health:Yes Equipment/Devices:Rolling walker  Discharge Condition:stable CODE STATUS:full Diet recommendation: Heart Healthy   Brief/Interim Summary: 77 y.o. male with a history of COPD, CAD/NSTEMI, and HTN presenting with dyspnea and cough. He reports worsening cough with associated chest pain and sputum production. He also has significant dyspnea, weakness, muscle aches and fever. All symptoms have been constant and began abruptly 4 days ago.  Discharge Diagnoses:  Active Problems:   Essential hypertension   COPD with acute exacerbation (HCC)   AKI (acute kidney injury) (HCC)   Gout flare  Acute severe COPD exacerbation due to influenza: - Spirometry in the past showed moderate airway obstruction with FEV1 39%. - Started on IV Solu-Medrol and Tamiflu due to positive PCR. - Oxygen by nasal cannula to maintain SpO2 88 - 92% - Continue tamiflu x5 days total, he will go home on a steroid taper. Continue dextromethorphan for cough as an outpatient. He relates he continues to have persistent cough. His breathing is better.  Acute kidney injury: On admission cr. 1.74, presumed baseline 1.2 (last value from 2016). Due to dehydration/poor po intake.  - improve with IV hydration. - On admission lasix and metolazone were on hold.  Essential Hypertension:  No changes made to his medication.  CAD:  - Cardiac markers negative 3. - Continue Plavix and aspirin - NTG prn  Hyperlipemia: - Chronic, has statin allergy.  - Place patient on a heart healthy diet  History of depression:  Patient expresses feelings of being down secondary to decreased sight and ability to  function. No suicidal thoughts continue current regimen.  Vision impairment:  This is impacting safety at discharge as he lives alone.  Acute gout flare:  - Should be treated with steroids. Pain is much better compared to yesterday. - Right great toe X-ray showed tufaceous information    Discharge Instructions  Discharge Instructions    Diet - low sodium heart healthy    Complete by:  As directed    Increase activity slowly    Complete by:  As directed      Allergies as of 05/03/2016      Reactions   Lipitor [atorvastatin] Other (See Comments)   "just paralyzed me; I can't take it"   Statins Other (See Comments)   "paralyzed me; I can't take it"      Medication List    STOP taking these medications   methylPREDNISolone 4 MG Tbpk tablet Commonly known as:  MEDROL DOSEPAK     TAKE these medications   albuterol 108 (90 Base) MCG/ACT inhaler Commonly known as:  PROVENTIL HFA;VENTOLIN HFA Inhale 2 puffs into the lungs every 6 (six) hours as needed for wheezing. Reported on 05/04/2015   aspirin 81 MG EC tablet Take 1 tablet (81 mg total) by mouth daily.   atenolol 25 MG tablet Commonly known as:  TENORMIN TAKE 1 TABLET (25 MG TOTAL) BY MOUTH EVERY MORNING.   clopidogrel 75 MG tablet Commonly known as:  PLAVIX TAKE 1 TABLET (75 MG TOTAL) BY MOUTH DAILY WITH BREAKFAST.   COLCRYS 0.6 MG tablet Generic drug:  colchicine Take 0.6 mg by mouth daily as needed for breakthrough pain.   cyclobenzaprine 10 MG tablet Commonly known  as:  FLEXERIL Take 1 tablet (10 mg total) by mouth 2 (two) times daily as needed for muscle spasms.   finasteride 5 MG tablet Commonly known as:  PROSCAR Take 5 mg by mouth daily. Reported on 05/04/2015   furosemide 40 MG tablet Commonly known as:  LASIX Take 40 mg by mouth 2 (two) times daily.   HYDROcodone-acetaminophen 5-325 MG tablet Commonly known as:  NORCO/VICODIN Take 2 tablets by mouth every 6 (six) hours as needed for severe  pain.   metolazone 2.5 MG tablet Commonly known as:  ZAROXOLYN Take 2.5 mg by mouth 3 (three) times a week. Before taking Tuesday, Thursday, Saturday   mometasone-formoterol 100-5 MCG/ACT Aero Commonly known as:  DULERA Inhale 2 puffs into the lungs 2 (two) times daily.   nabumetone 500 MG tablet Commonly known as:  RELAFEN Take 500 mg by mouth 2 (two) times daily as needed for pain. foot   naproxen 375 MG tablet Commonly known as:  NAPROSYN Take 1 tablet (375 mg total) by mouth 2 (two) times daily as needed for moderate pain.   nitroGLYCERIN 0.4 MG SL tablet Commonly known as:  NITROSTAT Place 0.4 mg under the tongue every 5 (five) minutes as needed. Reported on 05/04/2015   oseltamivir 30 MG capsule Commonly known as:  TAMIFLU Take 1 capsule (30 mg total) by mouth 2 (two) times daily.   polyethylene glycol packet Commonly known as:  MIRALAX / GLYCOLAX Take 17 g by mouth 2 (two) times daily.   predniSONE 10 MG tablet Commonly known as:  DELTASONE Takes 6 tablets for 1 days, then 5 tablets for 1 days, then 4 tablets for 1 days, then 3 tablets for 1 days, then 2 tabs for 1 days, then 1 tab for 1 days, and then stop.   sodium chloride 0.65 % Soln nasal spray Commonly known as:  OCEAN Place 1 spray into both nostrils as needed for congestion. Reported on 05/04/2015   tamsulosin 0.4 MG Caps capsule Commonly known as:  FLOMAX Take 0.4 mg by mouth daily after breakfast. Reported on 05/04/2015       Allergies  Allergen Reactions  . Lipitor [Atorvastatin] Other (See Comments)    "just paralyzed me; I can't take it"  . Statins Other (See Comments)    "paralyzed me; I can't take it"    Consultations:  None   Procedures/Studies: Dg Chest 2 View  Result Date: 05/01/2016 CLINICAL DATA:  Shortness of breath, fever, cold symptoms EXAM: CHEST  2 VIEW COMPARISON:  02/01/2015 FINDINGS: Cardiomediastinal silhouette is stable. No infiltrate or pleural effusion. No pulmonary  edema. Subtle mild perihilar bronchitic changes. Degenerative changes thoracic spine. IMPRESSION: No infiltrate or pulmonary edema. Mild perihilar bronchitic changes. Degenerative changes thoracic spine. Electronically Signed   By: Natasha MeadLiviu  Pop M.D.   On: 05/01/2016 12:43   Dg Toe Great Right  Result Date: 05/01/2016 CLINICAL DATA:  Pt c/o diffuse RIGHT great toe pain, swelling, denies inj, admitted for + flu today, reports hx known gout, arthritis to extremities. EXAM: RIGHT GREAT TOE COMPARISON:  None. FINDINGS: Osseous alignment is normal. Bone mineralization is normal. No fracture line or displaced fracture fragment identified. No acute or suspicious osseous lesion. No erosions or other confirming signs of an inflammatory arthritis. There is, however, a subtle density adjacent to the distal margin of the right first metatarsal bone which could represent early/mild tophus formation. IMPRESSION: 1. Subtle small density within the soft tissues immediately adjacent to the distal margin of the right  first metatarsal bone which may represent early/mild tophus formation related to patient's known gout. There are, however, no erosions or other confirming signs of an inflammatory arthritis. 2. No acute findings.  No fracture or dislocation. Electronically Signed   By: Bary Richard M.D.   On: 05/01/2016 20:56      Subjective: She relates his symptoms are better compared to yesterday.  Discharge Exam: Vitals:   05/02/16 2039 05/03/16 0554  BP: 128/63 126/67  Pulse: 78 72  Resp: (!) 22 20  Temp: 97.9 F (36.6 C) 97.8 F (36.6 C)   Vitals:   05/02/16 1616 05/02/16 2039 05/03/16 0554 05/03/16 0815  BP:  128/63 126/67   Pulse:  78 72   Resp:  (!) 22 20   Temp:  97.9 F (36.6 C) 97.8 F (36.6 C)   TempSrc:  Oral Oral   SpO2: 98% 97% 93% 93%  Weight:      Height:        General: Pt is alert, awake, not in acute distress Cardiovascular: RRR, S1/S2 +, no rubs, no gallops Respiratory: CTA  bilaterally, no wheezing, no rhonchi Abdominal: Soft, NT, ND, bowel sounds + Extremities: no edema, no cyanosis    The results of significant diagnostics from this hospitalization (including imaging, microbiology, ancillary and laboratory) are listed below for reference.     Microbiology: Recent Results (from the past 240 hour(s))  Blood Culture (routine x 2)     Status: None (Preliminary result)   Collection Time: 05/01/16  1:13 PM  Result Value Ref Range Status   Specimen Description BLOOD LEFT ANTECUBITAL  Final   Special Requests BOTTLES DRAWN AEROBIC AND ANAEROBIC 5 CC EA  Final   Culture   Final    NO GROWTH < 24 HOURS Performed at [name], [address] [place], [place] [address]    Report Status PENDING  Incomplete  Blood Culture (routine x 2)     Status: None (Preliminary result)   Collection Time: 05/01/16  1:20 PM  Result Value Ref Range Status   Specimen Description BLOOD RIGHT ANTECUBITAL  Final   Special Requests BOTTLES DRAWN AEROBIC AND ANAEROBIC 5 CC EA  Final   Culture   Final    NO GROWTH < 24 HOURS Performed at [facility], [address] [place], [place] [address]    Report Status PENDING  Incomplete  Urine culture     Status: None   Collection Time: 05/01/16  2:32 PM  Result Value Ref Range Status   Specimen Description URINE, RANDOM  Final   Special Requests NONE  Final   Culture   Final    NO GROWTH Performed at [facility], [address] [place], [place] [address]    Report Status 05/02/2016 FINAL  Final     Labs: BNP (last 3 results) No results for input(s): BNP in the last 8760 hours. Basic Metabolic Panel:  Recent Labs Lab 05/01/16 1153 05/01/16 2038 05/02/16 0146  NA 141  --  136  K 3.5  --  3.8  CL 98*  --  104  CO2 32  --  24  GLUCOSE 107*  --  273*  BUN 37*  --  30*  CREATININE 1.74* 1.59* 1.63*  CALCIUM 8.8*  --  7.7*   Liver Function Tests:  Recent Labs Lab 05/01/16 1153  AST 53*  ALT  45  ALKPHOS 73  BILITOT 0.8  PROT 6.4*  ALBUMIN 3.5  No results for input(s): LIPASE, AMYLASE in the last 168 hours. No results for input(s): AMMONIA in the last 168 hours. CBC:  Recent Labs Lab 05/01/16 1153 05/01/16 2038 05/02/16 0146  WBC 11.6* 17.3* 20.0*  NEUTROABS 9.7*  --   --   HGB 15.0 13.3 13.4  HCT 45.3 39.9 40.1  MCV 88.0 86.6 87.9  PLT 177 151 129*   Cardiac Enzymes:  Recent Labs Lab 05/01/16 2038 05/02/16 0200 05/02/16 0814  TROPONINI <0.03 <0.03 <0.03   BNP: Invalid input(s): POCBNP CBG: No results for input(s): GLUCAP in the last 168 hours. D-Dimer No results for input(s): DDIMER in the last 72 hours. Hgb A1c No results for input(s): HGBA1C in the last 72 hours. Lipid Profile No results for input(s): CHOL, HDL, LDLCALC, TRIG, CHOLHDL, LDLDIRECT in the last 72 hours. Thyroid function studies No results for input(s): TSH, T4TOTAL, T3FREE, THYROIDAB in the last 72 hours.  Invalid input(s): FREET3 Anemia work up No results for input(s): VITAMINB12, FOLATE, FERRITIN, TIBC, IRON, RETICCTPCT in the last 72 hours. Urinalysis    Component Value Date/Time   COLORURINE YELLOW 05/01/2016 1432   APPEARANCEUR CLEAR 05/01/2016 1432   LABSPEC 1.019 05/01/2016 1432   PHURINE 5.0 05/01/2016 1432   GLUCOSEU NEGATIVE 05/01/2016 1432   HGBUR NEGATIVE 05/01/2016 1432   BILIRUBINUR NEGATIVE 05/01/2016 1432   KETONESUR NEGATIVE 05/01/2016 1432   PROTEINUR NEGATIVE 05/01/2016 1432   UROBILINOGEN 1.0 04/29/2011 1809   NITRITE NEGATIVE 05/01/2016 1432   LEUKOCYTESUR NEGATIVE 05/01/2016 1432   Sepsis Labs Invalid input(s): PROCALCITONIN,  WBC,  LACTICIDVEN Microbiology Recent Results (from the past 240 hour(s))  Blood Culture (routine x 2)     Status: None (Preliminary result)   Collection Time: 05/01/16  1:13 PM  Result Value Ref Range Status   Specimen Description BLOOD LEFT ANTECUBITAL  Final   Special Requests BOTTLES DRAWN AEROBIC AND ANAEROBIC 5 CC EA   Final   Culture   Final    NO GROWTH < 24 HOURS Performed at [facility], [address] [place], [place] [address]    Report Status PENDING  Incomplete  Blood Culture (routine x 2)     Status: None (Preliminary result)   Collection Time: 05/01/16  1:20 PM  Result Value Ref Range Status   Specimen Description BLOOD RIGHT ANTECUBITAL  Final   Special Requests BOTTLES DRAWN AEROBIC AND ANAEROBIC 5 CC EA  Final   Culture   Final    NO GROWTH < 24 HOURS Performed at [facility], [name], [place], [place] [address]    Report Status PENDING  Incomplete  Urine culture     Status: None   Collection Time: 05/01/16  2:32 PM  Result Value Ref Range Status   Specimen Description URINE, RANDOM  Final   Special Requests NONE  Final   Culture   Final    NO GROWTH Performed at [facility], [address], [place], [place] [address]    Report Status 05/02/2016 FINAL  Final     Time coordinating discharge: Over 30 minutes  SIGNED:   Marinda Elk, MD  [facility] 05/03/2016, 8:39 AM Pager   If 7PM-7AM, please contact night-coverage [email or web address] Password [email or web address]

## 2016-05-03 NOTE — Progress Notes (Deleted)
    [facility] has been requested to perform a transesophageal echocardiogram on Mitchell [name] for possible endocarditis.  After careful review of history and examination, the risks and benefits of transesophageal echocardiogram have been explained including risks of esophageal damage, perforation (1:10,000 risk), bleeding, pharyngeal hematoma as well as other potential complications associated with conscious sedation including aspiration, arrhythmia, respiratory failure and death. Alternatives to treatment were discussed, questions were answered. Patient is willing to proceed.   Corine ShelterLuke Ambyr Qadri, [place] JerseyPA-C  05/03/2016 10:00 AM

## 2016-05-03 NOTE — Progress Notes (Signed)
CSW received consult for COPD Gold Protocol, though patient does not meet qualifications (only 1 admission within the past 6 months).   CSW signing off.   Garey Alleva, LCSW [facility] Clinical Social Worker cell #: [phone]    

## 2016-05-03 NOTE — Progress Notes (Signed)
Occupational Therapy Treatment Patient Details Name: Mitchell [name] MRN: [id number] DOB: March 06, 1941 Today's Date: 05/03/2016    History of present illness 76 yo male admitted with COPD exac, acute gout flare. Hx of COPD, NSTEMI, CAD, HTN, COPD      Follow Up Recommendations  Home health OT    Equipment Recommendations  None recommended by OT    Recommendations for Other Services      Precautions / Restrictions Precautions Precautions: Fall Precaution Comments: visual deficits per pt Restrictions Weight Bearing Restrictions: No       Mobility Bed Mobility               General bed mobility comments: pt in chair  Transfers Overall transfer level: Needs assistance Equipment used: 1 person hand held assist Transfers: Sit to/from Stand Sit to Stand: Supervision                  ADL Overall ADL's : Needs assistance/impaired                     Lower Body Dressing: Sit to/from stand;Cueing for sequencing;Minimal assistance   Toilet Transfer: Supervision/safety;Ambulation;Cueing for sequencing;Cueing for safety   Toileting- Clothing Manipulation and Hygiene: Supervision/safety;Sit to/from stand;Cueing for safety;Cueing for sequencing         General ADL Comments: pt very upset this morning and anxious.  Educated in energy conservation as well as safety with with ADL activiity.  Pt reports he didnt have a good night.  Pt anxious about leaving this day                Cognition   Behavior During Therapy: Mitchell [name] for tasks assessed/performed Overall Cognitive Status: Within Functional Limits for tasks assessed                               General Comments      Pertinent Vitals/ Pain       Pain Score: 6  Pain Location: headache Pain Descriptors / Indicators: Aching Pain Intervention(s): Monitored during session;Repositioned;Limited activity within patient's tolerance;Patient requesting pain meds-RN notified          Frequency  Min 2X/week        Progress Toward Goals  OT Goals(current goals can now be found in the care plan section)  Progress towards OT goals: Progressing toward goals     Plan Discharge plan needs to be updated       End of Session     Activity Tolerance Treatment limited secondary to agitation   Patient Left in chair;with nursing/sitter in room   Nurse Communication Mobility status        Time: [id number] OT Time Calculation (min): 22 min  Charges: OT General Charges $OT Visit: 1 Procedure OT Treatments $Self Care/Home Management : 8-22 mins  Millette Halberstam, Metro KungLorraine D 05/03/2016, 12:45 PM

## 2016-05-03 NOTE — Care Management Note (Signed)
Case Management Note  Patient Details  Name: Mitchell [name] MRN: [id number] Date of Birth: Jun 22, 1940  Subjective/Objective:  Patient qualifies home 02 will transport via [facility]-ambulance they will have 02. [facility] dme rep Mitchell [name] aware to [facility] 02 to patient's home,& aware of d/c today.[facility] rep Mitchell [name] aware of HHC orders-per nsg patient states he doesn't want HHC-[facility] will talk to patient about it. Home rw already brought to rm,to go with patient home.                  Action/Plan:d/c home w/HHC/ambulance/dme-home 02-to be delivered to his home.   Expected Discharge Date:  05/03/16               Expected Discharge Plan:  Home w Home Health Services  In-House Referral:     Discharge planning Services  CM Consult  Post Acute Care Choice:    Choice offered to:  Patient  DME Arranged:  Walker rolling, Oxygen DME Agency:  [facility]  HH Arranged:  PT, Nurse's Aide, Social Work, OT [facility]  Status of Service:  Completed, signed off  If discussed at [employer] Meetings, dates discussed:    Additional Comments:  Lanier ClamMahabir, Daine Croker, RN 05/03/2016, 12:54 PM

## 2016-05-04 SURGERY — ECHOCARDIOGRAM, TRANSESOPHAGEAL
Anesthesia: Moderate Sedation

## 2016-05-06 LAB — CULTURE, BLOOD (ROUTINE X 2)
Culture: NO GROWTH
Culture: NO GROWTH

## 2016-05-10 ENCOUNTER — Emergency Department (HOSPITAL_COMMUNITY): Payer: Medicare PPO

## 2016-05-10 ENCOUNTER — Inpatient Hospital Stay (HOSPITAL_COMMUNITY)
Admission: EM | Admit: 2016-05-10 | Discharge: 2016-05-15 | DRG: 191 | Disposition: A | Discharge status: Home Health | Payer: Medicare PPO | Attending: Internal Medicine | Admitting: Internal Medicine

## 2016-05-10 ENCOUNTER — Encounter (HOSPITAL_COMMUNITY): Payer: Self-pay

## 2016-05-10 DIAGNOSIS — I129 Hypertensive chronic kidney disease with stage 1 through stage 4 chronic kidney disease, or unspecified chronic kidney disease: Secondary | ICD-10-CM | POA: Diagnosis present

## 2016-05-10 DIAGNOSIS — R600 Localized edema: Secondary | ICD-10-CM | POA: Diagnosis present

## 2016-05-10 DIAGNOSIS — K219 Gastro-esophageal reflux disease without esophagitis: Secondary | ICD-10-CM | POA: Diagnosis present

## 2016-05-10 DIAGNOSIS — I10 Essential (primary) hypertension: Secondary | ICD-10-CM | POA: Diagnosis present

## 2016-05-10 DIAGNOSIS — N179 Acute kidney failure, unspecified: Secondary | ICD-10-CM | POA: Diagnosis present

## 2016-05-10 DIAGNOSIS — I252 Old myocardial infarction: Secondary | ICD-10-CM | POA: Diagnosis not present

## 2016-05-10 DIAGNOSIS — Z955 Presence of coronary angioplasty implant and graft: Secondary | ICD-10-CM

## 2016-05-10 DIAGNOSIS — J9611 Chronic respiratory failure with hypoxia: Secondary | ICD-10-CM | POA: Diagnosis present

## 2016-05-10 DIAGNOSIS — E785 Hyperlipidemia, unspecified: Secondary | ICD-10-CM | POA: Diagnosis present

## 2016-05-10 DIAGNOSIS — Z8249 Family history of ischemic heart disease and other diseases of the circulatory system: Secondary | ICD-10-CM

## 2016-05-10 DIAGNOSIS — I2510 Atherosclerotic heart disease of native coronary artery without angina pectoris: Secondary | ICD-10-CM | POA: Diagnosis present

## 2016-05-10 DIAGNOSIS — R627 Adult failure to thrive: Secondary | ICD-10-CM | POA: Diagnosis present

## 2016-05-10 DIAGNOSIS — Z79899 Other long term (current) drug therapy: Secondary | ICD-10-CM

## 2016-05-10 DIAGNOSIS — Z7902 Long term (current) use of antithrombotics/antiplatelets: Secondary | ICD-10-CM | POA: Diagnosis not present

## 2016-05-10 DIAGNOSIS — N183 Chronic kidney disease, stage 3 unspecified: Secondary | ICD-10-CM | POA: Diagnosis present

## 2016-05-10 DIAGNOSIS — E872 Acidosis: Secondary | ICD-10-CM | POA: Diagnosis present

## 2016-05-10 DIAGNOSIS — E669 Obesity, unspecified: Secondary | ICD-10-CM | POA: Diagnosis present

## 2016-05-10 DIAGNOSIS — T380X5A Adverse effect of glucocorticoids and synthetic analogues, initial encounter: Secondary | ICD-10-CM | POA: Diagnosis present

## 2016-05-10 DIAGNOSIS — J9601 Acute respiratory failure with hypoxia: Secondary | ICD-10-CM

## 2016-05-10 DIAGNOSIS — Z87891 Personal history of nicotine dependence: Secondary | ICD-10-CM | POA: Diagnosis not present

## 2016-05-10 DIAGNOSIS — J111 Influenza due to unidentified influenza virus with other respiratory manifestations: Secondary | ICD-10-CM | POA: Diagnosis present

## 2016-05-10 DIAGNOSIS — J441 Chronic obstructive pulmonary disease with (acute) exacerbation: Secondary | ICD-10-CM | POA: Diagnosis present

## 2016-05-10 DIAGNOSIS — J189 Pneumonia, unspecified organism: Secondary | ICD-10-CM

## 2016-05-10 DIAGNOSIS — Z7982 Long term (current) use of aspirin: Secondary | ICD-10-CM

## 2016-05-10 DIAGNOSIS — N400 Enlarged prostate without lower urinary tract symptoms: Secondary | ICD-10-CM | POA: Diagnosis present

## 2016-05-10 DIAGNOSIS — R7301 Impaired fasting glucose: Secondary | ICD-10-CM | POA: Diagnosis present

## 2016-05-10 DIAGNOSIS — Z6830 Body mass index (BMI) 30.0-30.9, adult: Secondary | ICD-10-CM | POA: Diagnosis not present

## 2016-05-10 DIAGNOSIS — I25119 Atherosclerotic heart disease of native coronary artery with unspecified angina pectoris: Secondary | ICD-10-CM | POA: Diagnosis present

## 2016-05-10 DIAGNOSIS — D72829 Elevated white blood cell count, unspecified: Secondary | ICD-10-CM

## 2016-05-10 DIAGNOSIS — J9610 Chronic respiratory failure, unspecified whether with hypoxia or hypercapnia: Secondary | ICD-10-CM | POA: Diagnosis present

## 2016-05-10 DIAGNOSIS — Z8659 Personal history of other mental and behavioral disorders: Secondary | ICD-10-CM

## 2016-05-10 LAB — BRAIN NATRIURETIC PEPTIDE: B Natriuretic Peptide: 80.5 pg/mL (ref 0.0–100.0)

## 2016-05-10 LAB — CBC WITH DIFFERENTIAL/PLATELET
Basophils Absolute: 0 10*3/uL (ref 0.0–0.1)
Basophils Relative: 0 %
Eosinophils Absolute: 0.1 10*3/uL (ref 0.0–0.7)
Eosinophils Relative: 1 %
HEMATOCRIT: 44.2 % (ref 39.0–52.0)
HEMOGLOBIN: 14.5 g/dL (ref 13.0–17.0)
Lymphocytes Relative: 12 %
Lymphs Abs: 2.1 10*3/uL (ref 0.7–4.0)
MCH: 29.1 pg (ref 26.0–34.0)
MCHC: 32.8 g/dL (ref 30.0–36.0)
MCV: 88.8 fL (ref 78.0–100.0)
MONOS PCT: 8 %
Monocytes Absolute: 1.5 10*3/uL — ABNORMAL HIGH (ref 0.1–1.0)
NEUTROS ABS: 14.3 10*3/uL — AB (ref 1.7–7.7)
NEUTROS PCT: 79 %
Platelets: 202 10*3/uL (ref 150–400)
RBC: 4.98 MIL/uL (ref 4.22–5.81)
RDW: 15.5 % (ref 11.5–15.5)
WBC: 18 10*3/uL — AB (ref 4.0–10.5)

## 2016-05-10 LAB — COMPREHENSIVE METABOLIC PANEL
ALK PHOS: 69 U/L (ref 38–126)
ALT: 25 U/L (ref 17–63)
ANION GAP: 13 (ref 5–15)
AST: 18 U/L (ref 15–41)
Albumin: 2.8 g/dL — ABNORMAL LOW (ref 3.5–5.0)
BUN: 18 mg/dL (ref 6–20)
CALCIUM: 8.8 mg/dL — AB (ref 8.9–10.3)
CHLORIDE: 96 mmol/L — AB (ref 101–111)
CO2: 29 mmol/L (ref 22–32)
Creatinine, Ser: 1.43 mg/dL — ABNORMAL HIGH (ref 0.61–1.24)
GFR calc non Af Amer: 46 mL/min — ABNORMAL LOW (ref >60)
GFR, EST AFRICAN AMERICAN: 53 mL/min — AB (ref >60)
Glucose, Bld: 111 mg/dL — ABNORMAL HIGH (ref 65–99)
Potassium: 3.5 mmol/L (ref 3.5–5.1)
SODIUM: 138 mmol/L (ref 135–145)
Total Bilirubin: 0.4 mg/dL (ref 0.3–1.2)
Total Protein: 5.6 g/dL — ABNORMAL LOW (ref 6.5–8.1)

## 2016-05-10 LAB — I-STAT CG4 LACTIC ACID, ED
LACTIC ACID, VENOUS: 1.89 mmol/L (ref 0.5–1.9)
Lactic Acid, Venous: 2.12 mmol/L (ref 0.5–1.9)

## 2016-05-10 LAB — TROPONIN I

## 2016-05-10 MED ORDER — SODIUM CHLORIDE 0.9% FLUSH: 3.0000 mL | INTRAVENOUS | Status: DC | PRN

## 2016-05-10 MED ORDER — ALBUTEROL SULFATE (2.5 MG/3ML) 0.083% IN NEBU
INHALATION_SOLUTION | RESPIRATORY_TRACT | Status: AC
  Filled 2016-05-10: qty 3

## 2016-05-10 MED ORDER — HYDROCODONE-ACETAMINOPHEN 5-325 MG PO TABS
2.0000 | ORAL_TABLET | Freq: Four times a day (QID) | ORAL | Status: DC | PRN
  Administered 2016-05-11: 2 via ORAL
  Administered 2016-05-11: 1 via ORAL
  Filled 2016-05-10 (×2): qty 2

## 2016-05-10 MED ORDER — NITROGLYCERIN 0.4 MG SL SUBL: 0.4000 mg | SUBLINGUAL_TABLET | SUBLINGUAL | Status: DC | PRN

## 2016-05-10 MED ORDER — FINASTERIDE 5 MG PO TABS
5.0000 mg | ORAL_TABLET | Freq: Every day | ORAL | Status: DC
  Administered 2016-05-11 – 2016-05-15 (×5): 5 mg via ORAL
  Filled 2016-05-10 (×6): qty 1

## 2016-05-10 MED ORDER — SODIUM CHLORIDE 0.9% FLUSH
3.0000 mL | Freq: Two times a day (BID) | INTRAVENOUS | Status: DC
  Administered 2016-05-10 – 2016-05-13 (×7): 3 mL via INTRAVENOUS

## 2016-05-10 MED ORDER — MOMETASONE FURO-FORMOTEROL FUM 100-5 MCG/ACT IN AERO
2.0000 | INHALATION_SPRAY | Freq: Two times a day (BID) | RESPIRATORY_TRACT | Status: DC
  Administered 2016-05-11 – 2016-05-15 (×9): 2 via RESPIRATORY_TRACT
  Filled 2016-05-10: qty 8.8

## 2016-05-10 MED ORDER — OSELTAMIVIR PHOSPHATE 75 MG PO CAPS
75.0000 mg | ORAL_CAPSULE | Freq: Once | ORAL | Status: AC
  Administered 2016-05-10: 75 mg via ORAL
  Filled 2016-05-10: qty 1

## 2016-05-10 MED ORDER — SODIUM CHLORIDE 0.9 % IV SOLN: 250.0000 mL | INTRAVENOUS | Status: DC | PRN

## 2016-05-10 MED ORDER — TAMSULOSIN HCL 0.4 MG PO CAPS
0.4000 mg | ORAL_CAPSULE | Freq: Every day | ORAL | Status: DC
  Administered 2016-05-11 – 2016-05-15 (×5): 0.4 mg via ORAL
  Filled 2016-05-10 (×5): qty 1

## 2016-05-10 MED ORDER — ATENOLOL 25 MG PO TABS
25.0000 mg | ORAL_TABLET | Freq: Every morning | ORAL | Status: DC
  Administered 2016-05-11 – 2016-05-15 (×5): 25 mg via ORAL
  Filled 2016-05-10 (×6): qty 1

## 2016-05-10 MED ORDER — SALINE SPRAY 0.65 % NA SOLN: 1.0000 | NASAL | Status: DC | PRN

## 2016-05-10 MED ORDER — CLOPIDOGREL BISULFATE 75 MG PO TABS
75.0000 mg | ORAL_TABLET | Freq: Every day | ORAL | Status: DC
  Administered 2016-05-11 – 2016-05-15 (×5): 75 mg via ORAL
  Filled 2016-05-10 (×5): qty 1

## 2016-05-10 MED ORDER — ONDANSETRON HCL 4 MG PO TABS: 4.0000 mg | ORAL_TABLET | Freq: Four times a day (QID) | ORAL | Status: DC | PRN

## 2016-05-10 MED ORDER — METHYLPREDNISOLONE SODIUM SUCC 125 MG IJ SOLR
60.0000 mg | Freq: Four times a day (QID) | INTRAMUSCULAR | Status: DC
  Administered 2016-05-11 – 2016-05-14 (×14): 60 mg via INTRAVENOUS
  Filled 2016-05-10 (×14): qty 2

## 2016-05-10 MED ORDER — CYCLOBENZAPRINE HCL 10 MG PO TABS
10.0000 mg | ORAL_TABLET | Freq: Two times a day (BID) | ORAL | Status: DC | PRN
Start: 2016-05-10 — End: 2016-05-15
  Administered 2016-05-11 (×2): 10 mg via ORAL
  Filled 2016-05-10 (×2): qty 1

## 2016-05-10 MED ORDER — ENOXAPARIN SODIUM 40 MG/0.4ML ~~LOC~~ SOLN
40.0000 mg | SUBCUTANEOUS | Status: DC
  Administered 2016-05-11 – 2016-05-13 (×3): 40 mg via SUBCUTANEOUS
  Filled 2016-05-10 (×3): qty 0.4

## 2016-05-10 MED ORDER — SODIUM CHLORIDE 0.9 % IV BOLUS (SEPSIS)
1000.0000 mL | Freq: Once | INTRAVENOUS | Status: AC
  Administered 2016-05-10: 1000 mL via INTRAVENOUS

## 2016-05-10 MED ORDER — ONDANSETRON HCL 4 MG/2ML IJ SOLN: 4.0000 mg | Freq: Four times a day (QID) | INTRAMUSCULAR | Status: DC | PRN

## 2016-05-10 MED ORDER — ACETAMINOPHEN 650 MG RE SUPP: 650.0000 mg | Freq: Four times a day (QID) | RECTAL | Status: DC | PRN

## 2016-05-10 MED ORDER — ASPIRIN EC 325 MG PO TBEC
325.0000 mg | DELAYED_RELEASE_TABLET | Freq: Every day | ORAL | Status: DC
  Administered 2016-05-11 – 2016-05-15 (×5): 325 mg via ORAL
  Filled 2016-05-10 (×6): qty 1

## 2016-05-10 MED ORDER — POLYETHYLENE GLYCOL 3350 17 G PO PACK
17.0000 g | PACK | Freq: Two times a day (BID) | ORAL | Status: DC
  Administered 2016-05-11 – 2016-05-15 (×7): 17 g via ORAL
  Filled 2016-05-10 (×8): qty 1

## 2016-05-10 MED ORDER — ONDANSETRON HCL 4 MG/2ML IJ SOLN
4.0000 mg | Freq: Once | INTRAMUSCULAR | Status: AC
  Administered 2016-05-10: 4 mg via INTRAVENOUS
  Filled 2016-05-10: qty 2

## 2016-05-10 MED ORDER — IBUPROFEN 400 MG PO TABS
400.0000 mg | ORAL_TABLET | Freq: Once | ORAL | Status: AC | PRN
  Administered 2016-05-10: 400 mg via ORAL

## 2016-05-10 MED ORDER — ACETAMINOPHEN 325 MG PO TABS
650.0000 mg | ORAL_TABLET | Freq: Four times a day (QID) | ORAL | Status: DC | PRN
  Administered 2016-05-12 – 2016-05-15 (×2): 650 mg via ORAL
  Filled 2016-05-10 (×2): qty 2

## 2016-05-10 MED ORDER — ALBUTEROL SULFATE (2.5 MG/3ML) 0.083% IN NEBU
5.0000 mg | INHALATION_SOLUTION | Freq: Once | RESPIRATORY_TRACT | Status: AC
  Administered 2016-05-10: 5 mg via RESPIRATORY_TRACT

## 2016-05-10 MED ORDER — ACETAMINOPHEN 500 MG PO TABS
1000.0000 mg | ORAL_TABLET | Freq: Once | ORAL | Status: AC
  Administered 2016-05-10: 1000 mg via ORAL
  Filled 2016-05-10: qty 2

## 2016-05-10 MED ORDER — IBUPROFEN 400 MG PO TABS
ORAL_TABLET | ORAL | Status: AC
  Filled 2016-05-10: qty 1

## 2016-05-10 MED ORDER — IPRATROPIUM-ALBUTEROL 0.5-2.5 (3) MG/3ML IN SOLN
3.0000 mL | RESPIRATORY_TRACT | Status: DC | PRN
  Administered 2016-05-11: 3 mL via RESPIRATORY_TRACT
  Filled 2016-05-10: qty 3

## 2016-05-10 MED ORDER — OSELTAMIVIR PHOSPHATE 30 MG PO CAPS
30.0000 mg | ORAL_CAPSULE | Freq: Two times a day (BID) | ORAL | Status: DC
  Administered 2016-05-11 – 2016-05-15 (×9): 30 mg via ORAL
  Filled 2016-05-10 (×10): qty 1

## 2016-05-10 NOTE — ED Triage Notes (Signed)
Per Pt, Pt is coming from PCP where they sent him over to work him up for sepsis. Pt was noted to have PNA per Xray. BP was noted to be low with fever. Pt is alert and oriented x4 upon arrival.

## 2016-05-10 NOTE — H&P (Signed)
History and Physical    Mitchell [name] ZOX:[id number] DOB: [birth year] DOA: 05/10/2016  PCP: Duane LopeAlan Ross, MD   Patient coming from: Home, by way of PCP office  Chief Complaint: Fevers, malaise, cough, SOB   HPI: Mitchell [name] is a 76 y.o. male with medical history significant for COPD with chronic hypoxic respiratory failure, hypertension, coronary artery disease status post stent to LAD, and GERD who presents to the emergency department at the direction of his primary care physician for evaluation of fevers, productive cough, and dyspnea. Patient had recently been admitted to the hospital and treated for exacerbation and COPD with influenza and acute kidney injury. He was discharged home in much improved and stable condition on 05/03/2016 to complete a course of Tamiflu and prednisone. Unfortunately, patient quickly began to re-worsened back at home with persistent fevers and return of his cough and dyspnea. His cough is now productive of thick yellow and white sputum and the patient reports dyspnea with minimal exertion. He reports continued fevers and chills with malaise and generalized aches. He denies any significant abdominal pain and denies vomiting or diarrhea. Patient also denies dysuria or flank pain. There has been no chest pain or palpitations associated with this illness. Patient was febrile at his PCP office and blood pressure was soft. He was directed to the ED for further evaluation.  ED Course: Upon arrival to the ED, patient is found to be afebrile, saturating adequately on his usual 2 L per minute of supplemental oxygen, and with vitals otherwise stable. Chest x-ray is negative for acute cardiopulmonary disease and chemistry panel is notable for serum creatinine 1.43, up from an apparent baseline of 1.2. CBC is notable for a leukocytosis to 18,000 and lactic acid is elevated to a value of 2.12. Blood cultures were obtained and 1 L of normal saline was given in the emergency  department. Patient was treated with Tylenol, nebs, Tamiflu, Advil, and Zofran in the ED. He reported some mild subjective improvement with these measures, but continued to be significantly dyspneic while at rest and will be observed on the medical surgical unit for ongoing evaluation and management of recurrent exacerbation and COPD.  Review of Systems:  All other systems reviewed and apart from HPI, are negative.  Past Medical History:  Diagnosis Date  . Anginal pain (HCC) 01/2012  . Anxiety   . Atherosclerotic heart disease of native coronary artery without angina pectoris   . BPH (benign prostatic hypertrophy)   . CAD (coronary artery disease) 2007   with LAD/Diag CBPTCA and kissing   . COPD (chronic obstructive pulmonary disease) (HCC)    "maybe" (02/14/2012)  . Coronary artery disease   . Depression   . GERD (gastroesophageal reflux disease)   . Hyperlipidemia   . Hypertension   . Hypoxemia   . Myocardial infarction 01/22/2012   NSTEMI with DES to RSA (PDA)  . Shortness of breath    "walking and laying down sometimes" (02/14/2012)    Past Surgical History:  Procedure Laterality Date  . CATARACT EXTRACTION W/ INTRAOCULAR LENS  IMPLANT, BILATERAL  ~ 2010  . CORONARY ANGIOPLASTY WITH STENT PLACEMENT  05/11/2005   PCI OF LAD  . GANGLION CYST REMOVED  1956?   right  . LEFT HEART CATHETERIZATION WITH CORONARY ANGIOGRAM Bilateral 01/24/2012   Procedure: LEFT HEART CATHETERIZATION WITH CORONARY ANGIOGRAM;  Surgeon: Lesleigh NoeHenry W Smith III, MD;  [facility];  Service: Cardiovascular;  Laterality: Bilateral;  . REFRACTIVE SURGERY  2010  left     reports that he quit smoking about 5 years ago. His smoking use included Pipe. He quit after 42.00 years of use. He has never used smokeless tobacco. He reports that he drinks about 3.5 oz of alcohol per week . He reports that he does not use drugs.  Allergies  Allergen Reactions  . Lipitor [Atorvastatin] Other (See Comments)     "just paralyzed me; I can't take it"  . Statins Other (See Comments)    "paralyzed me; I can't take it"    Family History  Problem Relation Age of Onset  . Heart failure Mother   . Heart failure Brother      Prior to Admission medications   Medication Sig Start Date End Date Taking? Authorizing Provider  albuterol (PROVENTIL HFA;VENTOLIN HFA) 108 (90 BASE) MCG/ACT inhaler Inhale 2 puffs into the lungs every 6 (six) hours as needed for wheezing. Reported on 05/04/2015   Yes Historical Provider, MD  aspirin EC 325 MG tablet Take 325 mg by mouth daily.   Yes Historical Provider, MD  atenolol (TENORMIN) 25 MG tablet TAKE 1 TABLET (25 MG TOTAL) BY MOUTH EVERY MORNING. 01/05/16  Yes Beatrice Lecher, PA-C  clopidogrel (PLAVIX) 75 MG tablet TAKE 1 TABLET (75 MG TOTAL) BY MOUTH DAILY WITH BREAKFAST. 01/05/16  Yes Scott T Weaver, PA-C  COLCRYS 0.6 MG tablet Take 0.6 mg by mouth daily as needed for breakthrough pain. 04/10/16  Yes Historical Provider, MD  cyclobenzaprine (FLEXERIL) 10 MG tablet Take 1 tablet (10 mg total) by mouth 2 (two) times daily as needed for muscle spasms. 01/01/16  Yes Shaune Pollack, MD  finasteride (PROSCAR) 5 MG tablet Take 5 mg by mouth daily. Reported on 05/04/2015   Yes Historical Provider, MD  furosemide (LASIX) 40 MG tablet Take 40 mg by mouth 2 (two) times daily. 10/06/15  Yes Historical Provider, MD  HYDROcodone-acetaminophen (NORCO/VICODIN) 5-325 MG tablet Take 2 tablets by mouth every 6 (six) hours as needed for severe pain. 01/01/16  Yes Shaune Pollack, MD  metolazone (ZAROXOLYN) 2.5 MG tablet Take 2.5 mg by mouth 3 (three) times a week. Before taking Tuesday, Thursday, Saturday   Yes Historical Provider, MD  mometasone-formoterol (DULERA) 100-5 MCG/ACT AERO Inhale 2 puffs into the lungs 2 (two) times daily. 02/08/15  Yes Oretha Milch, MD  nabumetone (RELAFEN) 500 MG tablet Take 500 mg by mouth 2 (two) times daily as needed for pain. foot 04/10/16  Yes Historical Provider,  MD  naproxen (NAPROSYN) 375 MG tablet Take 1 tablet (375 mg total) by mouth 2 (two) times daily as needed for moderate pain. 01/01/16  Yes Shaune Pollack, MD  nitroGLYCERIN (NITROSTAT) 0.4 MG SL tablet Place 0.4 mg under the tongue every 5 (five) minutes as needed. Reported on 05/04/2015   Yes Historical Provider, MD  polyethylene glycol (MIRALAX / GLYCOLAX) packet Take 17 g by mouth 2 (two) times daily. 05/03/16  Yes Marinda Elk, MD  sodium chloride (OCEAN) 0.65 % SOLN nasal spray Place 1 spray into both nostrils as needed for congestion. Reported on 05/04/2015   Yes Historical Provider, MD  tamsulosin (FLOMAX) 0.4 MG CAPS capsule Take 0.4 mg by mouth daily after breakfast. Reported on 05/04/2015 03/19/13  Yes Historical Provider, MD  VIRTUSSIN A/C 100-10 MG/5ML syrup Take 5 mLs by mouth 4 (four) times daily as needed for cough.  05/09/16  Yes Historical Provider, MD  aspirin EC 81 MG EC tablet Take 1 tablet (81 mg total) by mouth  daily. Patient not taking: Reported on 05/10/2016 02/15/12   Corky Crafts, MD  oseltamivir (TAMIFLU) 30 MG capsule Take 1 capsule (30 mg total) by mouth 2 (two) times daily. Patient not taking: Reported on 05/10/2016 05/03/16   Marinda Elk, MD  predniSONE (DELTASONE) 10 MG tablet Takes 6 tablets for 1 days, then 5 tablets for 1 days, then 4 tablets for 1 days, then 3 tablets for 1 days, then 2 tabs for 1 days, then 1 tab for 1 days, and then stop. Patient not taking: Reported on 05/10/2016 05/03/16   Marinda Elk, MD    Physical Exam: Vitals:   05/10/16 2100 05/10/16 2115 05/10/16 2145 05/10/16 2230  BP: 103/68 93/67 108/64 111/64  Pulse: 63 (!) 58 (!) 56 (!) 59  Resp: 14 14 17 10   Temp:      TempSrc:      SpO2: 95% 94% 96% 95%  Weight:      Height:          Constitutional: Mild tachypnea and audible wheezing. Appears uncomfortable. No pallor or diaphoresis.  Eyes: PERTLA, lids and conjunctivae normal ENMT: Mucous membranes are moist.  Posterior pharynx clear of any exudate or lesions.   Neck: normal, supple, no masses, no thyromegaly Respiratory: Coarse rhonchi at left base. Wheezes throughout. Increased WOB. Cardiovascular: S1 & S2 heard, regular rate and rhythm, no significant murmur. 1+ pretibial edema bilaterally. No significant JVD. Abdomen: No distension, no tenderness, no masses palpated. Bowel sounds normal.  Musculoskeletal: no clubbing / cyanosis. No joint deformity upper and lower extremities. Normal muscle tone.  Skin: no significant rashes, lesions, ulcers. Warm, dry, well-perfused. Neurologic: CN 2-12 grossly intact. Sensation intact, DTR normal. Strength 5/5 in all 4 limbs.  Psychiatric: Normal judgment and insight. Alert and oriented x 3. Normal mood and affect.     Labs on Admission: I have personally reviewed following labs and imaging studies  CBC:  Recent Labs Lab 05/10/16 1734  WBC 18.0*  NEUTROABS 14.3*  HGB 14.5  HCT 44.2  MCV 88.8  PLT 202   Basic Metabolic Panel:  Recent Labs Lab 05/10/16 1734  NA 138  K 3.5  CL 96*  CO2 29  GLUCOSE 111*  BUN 18  CREATININE 1.43*  CALCIUM 8.8*   GFR: Estimated Creatinine Clearance: 54.3 mL/min (by C-G formula based on SCr of 1.43 mg/dL (H)). Liver Function Tests:  Recent Labs Lab 05/10/16 1734  AST 18  ALT 25  ALKPHOS 69  BILITOT 0.4  PROT 5.6*  ALBUMIN 2.8*   No results for input(s): LIPASE, AMYLASE in the last 168 hours. No results for input(s): AMMONIA in the last 168 hours. Coagulation Profile: No results for input(s): INR, PROTIME in the last 168 hours. Cardiac Enzymes: No results for input(s): CKTOTAL, CKMB, CKMBINDEX, TROPONINI in the last 168 hours. BNP (last 3 results) No results for input(s): PROBNP in the last 8760 hours. HbA1C: No results for input(s): HGBA1C in the last 72 hours. CBG: No results for input(s): GLUCAP in the last 168 hours. Lipid Profile: No results for input(s): CHOL, HDL, LDLCALC, TRIG,  CHOLHDL, LDLDIRECT in the last 72 hours. Thyroid Function Tests: No results for input(s): TSH, T4TOTAL, FREET4, T3FREE, THYROIDAB in the last 72 hours. Anemia Panel: No results for input(s): VITAMINB12, FOLATE, FERRITIN, TIBC, IRON, RETICCTPCT in the last 72 hours. Urine analysis:    Component Value Date/Time   COLORURINE YELLOW 05/01/2016 1432   APPEARANCEUR CLEAR 05/01/2016 1432   LABSPEC 1.019 05/01/2016 1432  PHURINE 5.0 05/01/2016 1432   GLUCOSEU NEGATIVE 05/01/2016 1432   HGBUR NEGATIVE 05/01/2016 1432   BILIRUBINUR NEGATIVE 05/01/2016 1432   KETONESUR NEGATIVE 05/01/2016 1432   PROTEINUR NEGATIVE 05/01/2016 1432   UROBILINOGEN 1.0 04/29/2011 1809   NITRITE NEGATIVE 05/01/2016 1432   LEUKOCYTESUR NEGATIVE 05/01/2016 1432   Sepsis Labs: @LABRCNTIP (procalcitonin:4,lacticidven:4) ) Recent Results (from the past 240 hour(s))  Blood Culture (routine x 2)     Status: None   Collection Time: 05/01/16  1:13 PM  Result Value Ref Range Status   Specimen Description BLOOD LEFT ANTECUBITAL  Final   Special Requests BOTTLES DRAWN AEROBIC AND ANAEROBIC 5 CC EA  Final   Culture   Final    NO GROWTH 5 DAYS Performed at [facility], [address] [place], [place] [address]    Report Status 05/06/2016 FINAL  Final  Blood Culture (routine x 2)     Status: None   Collection Time: 05/01/16  1:20 PM  Result Value Ref Range Status   Specimen Description BLOOD RIGHT ANTECUBITAL  Final   Special Requests BOTTLES DRAWN AEROBIC AND ANAEROBIC 5 CC EA  Final   Culture   Final    NO GROWTH 5 DAYS Performed at [facility], [address] [place], [place] [address]    Report Status 05/06/2016 FINAL  Final  Urine culture     Status: None   Collection Time: 05/01/16  2:32 PM  Result Value Ref Range Status   Specimen Description URINE, RANDOM  Final   Special Requests NONE  Final   Culture   Final    NO GROWTH Performed at [facility], [address] [place],  [place] [address]    Report Status 05/02/2016 FINAL  Final     Radiological Exams on Admission: Dg Chest Portable 1 View  Result Date: 05/10/2016 CLINICAL DATA:  Possible sepsis. EXAM: PORTABLE CHEST 1 VIEW COMPARISON:  05/10/2016 FINDINGS: 2112 hours. The lungs are clear wiithout focal pneumonia, edema, pneumothorax or pleural effusion. The cardiopericardial silhouette is within normal limits for size. The visualized bony structures of the thorax are intact. Telemetry leads overlie the chest. IMPRESSION: No active disease. Electronically Signed   By: Kennith Center M.D.   On: 05/10/2016 21:30    EKG: Not performed, will obtain as appropriate.   Assessment/Plan  1. COPD with acute exacerbation   - Pt just completed prednisone taper day prior to admission, but has worsening distress and productive cough with return in fevers - CXR appears clear; there is wheezing on exam and pt dyspneic at rest   - Obtain sputum culture and gram stain - Start systemic steroid with Solu-Medrol, start Levaquin, continue scheduled Dulera, prn albuterol nebs - Just completed 5-days of Tamiflu, will extend the course    2. CAD  - No anginal complaints  - Troponin is undetectable  - Pt has hx of stent to LAD, follows with cardiology  - Continue daily ASA 325 and Plavix, beta-blocker    3. CKD stage III  - SCr is 1.43 on admission, slightly up from apparent baseline of ~1.2  - Hold Lasix and metolazone on admission; pt was given 1 liter NS in ED  - Repeat chemistries in am, follow daily wts and I/O's   4. Hypertension - BP at goal on admission  - Continue atenolol as tolerated    DVT prophylaxis: sq Lovenox  Code Status: Full  Family Communication: Ex-wife updated at bedside at patient's  request Disposition Plan: Observe on med-surg Consults called: None Admission status: Inpatient    Briscoe Deutscher, MD [facility] Pager [phone]  If 7PM-7AM, please contact  night-coverage [email or web address] Password [facility]  05/10/2016, 10:55 PM

## 2016-05-10 NOTE — ED Notes (Signed)
Patient is stable and ready to be transport to the floor at this time.  Report was called to 5C RN.  Belongings taken with the patient to the floor.   

## 2016-05-10 NOTE — ED Provider Notes (Signed)
[facility]-EMERGENCY DEPT Provider Note  CSN: [id number] Arrival date & time: 05/10/16  1718  History   Chief Complaint Chief Complaint  Patient presents with  . Cough   HPI Mitchell [name] is a 76 y.o. male.  The history is provided by the patient, a relative and medical records. No language interpreter was used.  Illness  This is a new problem. The current episode started more than 1 week ago. The problem occurs constantly. The problem has been gradually worsening. Associated symptoms include abdominal pain (cramping), headaches and shortness of breath. Pertinent negatives include no chest pain. The symptoms are aggravated by eating. Nothing relieves the symptoms.    Past Medical History:  Diagnosis Date  . Anginal pain (HCC) 01/2012  . Anxiety   . Atherosclerotic heart disease of native coronary artery without angina pectoris   . BPH (benign prostatic hypertrophy)   . CAD (coronary artery disease) 2007   with LAD/Diag CBPTCA and kissing   . COPD (chronic obstructive pulmonary disease) (HCC)    "maybe" (02/14/2012)  . Coronary artery disease   . Depression   . GERD (gastroesophageal reflux disease)   . Hyperlipidemia   . Hypertension   . Hypoxemia   . Myocardial infarction 01/22/2012   NSTEMI with DES to RSA (PDA)  . Shortness of breath    "walking and laying down sometimes" (02/14/2012)   Patient Active Problem List   Diagnosis Date Noted  . Influenza 05/10/2016  . CKD (chronic kidney disease), stage III 05/10/2016  . Acute respiratory failure with hypoxia (HCC) 05/02/2016  . AKI (acute kidney injury) (HCC) 05/02/2016  . Gout flare 05/02/2016  . Chronic respiratory failure (HCC) 03/15/2015  . COPD with acute exacerbation (HCC) 01/31/2015  . Vision impairment 01/31/2015  . Right foot pain 01/31/2015  . Obesity 01/31/2015  . Alcohol use 01/31/2015  . COPD (chronic obstructive pulmonary disease) (HCC) 04/30/2013  . Dyspnea 02/14/2012  . Hyperlipemia   . History of  depression   . Chest pain 01/23/2012  . CAD (coronary artery disease) 01/23/2012  . Cholelithiasis   . Essential hypertension 04/29/2011   Past Surgical History:  Procedure Laterality Date  . CATARACT EXTRACTION W/ INTRAOCULAR LENS  IMPLANT, BILATERAL  ~ 2010  . CORONARY ANGIOPLASTY WITH STENT PLACEMENT  05/11/2005   PCI OF LAD  . GANGLION CYST REMOVED  1956?   right  . LEFT HEART CATHETERIZATION WITH CORONARY ANGIOGRAM Bilateral 01/24/2012   Procedure: LEFT HEART CATHETERIZATION WITH CORONARY ANGIOGRAM;  Surgeon: Lesleigh Noe, MD;  [facility];  Service: Cardiovascular;  Laterality: Bilateral;  . REFRACTIVE SURGERY  2010   left    Home Medications    Prior to Admission medications   Medication Sig Start Date End Date Taking? Authorizing Provider  albuterol (PROVENTIL HFA;VENTOLIN HFA) 108 (90 BASE) MCG/ACT inhaler Inhale 2 puffs into the lungs every 6 (six) hours as needed for wheezing. Reported on 05/04/2015   Yes Historical Provider, MD  aspirin EC 325 MG tablet Take 325 mg by mouth daily.   Yes Historical Provider, MD  atenolol (TENORMIN) 25 MG tablet TAKE 1 TABLET (25 MG TOTAL) BY MOUTH EVERY MORNING. 01/05/16  Yes Beatrice Lecher, PA-C  clopidogrel (PLAVIX) 75 MG tablet TAKE 1 TABLET (75 MG TOTAL) BY MOUTH DAILY WITH BREAKFAST. 01/05/16  Yes Scott T Weaver, PA-C  COLCRYS 0.6 MG tablet Take 0.6 mg by mouth daily as needed for breakthrough pain. 04/10/16  Yes Historical Provider, MD  cyclobenzaprine (FLEXERIL) 10  MG tablet Take 1 tablet (10 mg total) by mouth 2 (two) times daily as needed for muscle spasms. 01/01/16  Yes Shaune Pollackameron Isaacs, MD  finasteride (PROSCAR) 5 MG tablet Take 5 mg by mouth daily. Reported on 05/04/2015   Yes Historical Provider, MD  furosemide (LASIX) 40 MG tablet Take 40 mg by mouth 2 (two) times daily. 10/06/15  Yes Historical Provider, MD  HYDROcodone-acetaminophen (NORCO/VICODIN) 5-325 MG tablet Take 2 tablets by mouth every 6 (six) hours as needed  for severe pain. 01/01/16  Yes Shaune Pollackameron Isaacs, MD  metolazone (ZAROXOLYN) 2.5 MG tablet Take 2.5 mg by mouth 3 (three) times a week. Before taking Tuesday, Thursday, Saturday   Yes Historical Provider, MD  mometasone-formoterol (DULERA) 100-5 MCG/ACT AERO Inhale 2 puffs into the lungs 2 (two) times daily. 02/08/15  Yes Oretha Milchakesh Alva V, MD  nabumetone (RELAFEN) 500 MG tablet Take 500 mg by mouth 2 (two) times daily as needed for pain. foot 04/10/16  Yes Historical Provider, MD  naproxen (NAPROSYN) 375 MG tablet Take 1 tablet (375 mg total) by mouth 2 (two) times daily as needed for moderate pain. 01/01/16  Yes Shaune Pollackameron Isaacs, MD  nitroGLYCERIN (NITROSTAT) 0.4 MG SL tablet Place 0.4 mg under the tongue every 5 (five) minutes as needed. Reported on 05/04/2015   Yes Historical Provider, MD  polyethylene glycol (MIRALAX / GLYCOLAX) packet Take 17 g by mouth 2 (two) times daily. 05/03/16  Yes Marinda ElkAbraham Feliz Ortiz, MD  sodium chloride (OCEAN) 0.65 % SOLN nasal spray Place 1 spray into both nostrils as needed for congestion. Reported on 05/04/2015   Yes Historical Provider, MD  tamsulosin (FLOMAX) 0.4 MG CAPS capsule Take 0.4 mg by mouth daily after breakfast. Reported on 05/04/2015 03/19/13  Yes Historical Provider, MD  VIRTUSSIN A/C 100-10 MG/5ML syrup Take 5 mLs by mouth 4 (four) times daily as needed for cough.  05/09/16  Yes Historical Provider, MD  aspirin EC 81 MG EC tablet Take 1 tablet (81 mg total) by mouth daily. Patient not taking: Reported on 05/10/2016 02/15/12   Corky CraftsJayadeep S Varanasi, MD  oseltamivir (TAMIFLU) 30 MG capsule Take 1 capsule (30 mg total) by mouth 2 (two) times daily. Patient not taking: Reported on 05/10/2016 05/03/16   Marinda ElkAbraham Feliz Ortiz, MD  predniSONE (DELTASONE) 10 MG tablet Takes 6 tablets for 1 days, then 5 tablets for 1 days, then 4 tablets for 1 days, then 3 tablets for 1 days, then 2 tabs for 1 days, then 1 tab for 1 days, and then stop. Patient not taking: Reported on 05/10/2016 05/03/16    Marinda ElkAbraham Feliz Ortiz, MD   Family History Family History  Problem Relation Age of Onset  . Heart failure Mother   . Heart failure Brother    Social History Social History  Substance Use Topics  . Smoking status: Former Smoker    Years: 42.00    Types: Pipe    Quit date: 04/28/2011  . Smokeless tobacco: Never Used  . Alcohol use 3.5 oz/week    7 Standard drinks or equivalent per week     Comment: 02/14/2012 "weekends I have 6-7 mixed drinks"     occasional    Allergies   Lipitor [atorvastatin] and Statins  Review of Systems Review of Systems  Constitutional: Positive for appetite change (decreased), chills, fatigue and fever.  HENT: Positive for congestion.   Respiratory: Positive for cough, shortness of breath and wheezing.   Cardiovascular: Positive for leg swelling. Negative for chest pain.  Gastrointestinal: Positive  for abdominal pain (cramping), diarrhea, nausea and vomiting.  Neurological: Positive for headaches.  All other systems reviewed and are negative.   Physical Exam Updated Vital Signs BP 111/64   Pulse (!) 59   Temp 98 F (36.7 C) (Oral)   Resp 10   Ht 6' (1.829 m)   Wt 102.1 kg   SpO2 95%   BMI 30.52 kg/m   Physical Exam  Constitutional: He is oriented to person, place, and time. No distress.  Obese elderly Caucasian male  HENT:  Head: Normocephalic and atraumatic.  Eyes: EOM are normal. Pupils are equal, round, and reactive to light.  Neck: Normal range of motion. Neck supple.  Cardiovascular: Normal rate, regular rhythm and normal heart sounds.   Pulmonary/Chest: Effort normal. He has wheezes.  Maintaining sat's on [place], faint end expiratory wheeze, decreased breath sounds at bases  Abdominal: Soft. Bowel sounds are normal. He exhibits distension. There is no tenderness.  Musculoskeletal: Normal range of motion. He exhibits edema.  Neurological: He is alert and oriented to person, place, and time.  Skin: Skin is warm and dry. Capillary  refill takes less than 2 seconds. He is not diaphoretic.  Nursing note and vitals reviewed.   ED Treatments / Results  Labs (all labs ordered are listed, but only abnormal results are displayed) Labs Reviewed  COMPREHENSIVE METABOLIC PANEL - Abnormal; Notable for the following:       Result Value   Chloride 96 (*)    Glucose, Bld 111 (*)    Creatinine, Ser 1.43 (*)    Calcium 8.8 (*)    Total Protein 5.6 (*)    Albumin 2.8 (*)    GFR calc non Af Amer 46 (*)    GFR calc Af Amer 53 (*)    All other components within normal limits  CBC WITH DIFFERENTIAL/PLATELET - Abnormal; Notable for the following:    WBC 18.0 (*)    Neutro Abs 14.3 (*)    Monocytes Absolute 1.5 (*)    All other components within normal limits  I-STAT CG4 LACTIC ACID, ED - Abnormal; Notable for the following:    Lactic Acid, Venous 2.12 (*)    All other components within normal limits  CULTURE, BLOOD (ROUTINE X 2)  CULTURE, BLOOD (ROUTINE X 2)  URINALYSIS, ROUTINE W REFLEX MICROSCOPIC  I-STAT CG4 LACTIC ACID, ED   EKG  EKG Interpretation None      Radiology Dg Chest Portable 1 View  Result Date: 05/10/2016 CLINICAL DATA:  Possible sepsis. EXAM: PORTABLE CHEST 1 VIEW COMPARISON:  05/10/2016 FINDINGS: 2112 hours. The lungs are clear wiithout focal pneumonia, edema, pneumothorax or pleural effusion. The cardiopericardial silhouette is within normal limits for size. The visualized bony structures of the thorax are intact. Telemetry leads overlie the chest. IMPRESSION: No active disease. Electronically Signed   By: Kennith Center M.D.   On: 05/10/2016 21:30   Procedures Procedures (including critical care time)  Medications Ordered in ED Medications  albuterol (PROVENTIL) (2.5 MG/3ML) 0.083% nebulizer solution (not administered)  ibuprofen (ADVIL,MOTRIN) 400 MG tablet (not administered)  albuterol (PROVENTIL) (2.5 MG/3ML) 0.083% nebulizer solution 5 mg (5 mg Nebulization Given 05/10/16 1732)  ibuprofen  (ADVIL,MOTRIN) tablet 400 mg (400 mg Oral Given 05/10/16 1735)  sodium chloride 0.9 % bolus 1,000 mL (1,000 mLs Intravenous New Bag/Given 05/10/16 2125)  ondansetron (ZOFRAN) injection 4 mg (4 mg Intravenous Given 05/10/16 2122)  acetaminophen (TYLENOL) tablet 1,000 mg (1,000 mg Oral Given 05/10/16 2123)  oseltamivir (TAMIFLU) capsule  75 mg (75 mg Oral Given 05/10/16 2155)    Initial Impression / Assessment and Plan / ED Course  I have reviewed the triage vital signs and the nursing notes.  76 y.o. male with above stated PMHx, HPI, and physical. PMHx of COPD (2L [place] home O2 as needed). Flulike symptoms over past week and a half. Patient recently been at outside hospital for 2 days for the symptoms and treated with Tamiflu at that time. Patient has been at home for past week now with worsening symptoms.  Influenza risk factors including age & COPD. Given Tamiflu. CXR without PNA. WBC 18. BMP with elevated Cr consistent with Hx of CKD.  Laboratory and imaging results were personally reviewed by myself and used in the medical decision making of this patient's treatment and disposition.  Pt admitted to medicine for further evaluation and management of IVF's in setting of influenza. Pt understands and agrees with the plan and has no further questions or concerns.   Pt care discussed with and followed by my attending, Dr. Benjiman Core  Angelina Ok, MD Pager [phone]  Final Clinical Impressions(s) / ED Diagnoses   Final diagnoses:  Influenza  Leukocytosis   New Prescriptions New Prescriptions   No medications on file     Angelina Ok, MD 05/10/16 9604    Benjiman Core, MD 05/10/16 2330

## 2016-05-11 DIAGNOSIS — J441 Chronic obstructive pulmonary disease with (acute) exacerbation: Principal | ICD-10-CM

## 2016-05-11 LAB — URINALYSIS, ROUTINE W REFLEX MICROSCOPIC
BILIRUBIN URINE: NEGATIVE
Glucose, UA: NEGATIVE mg/dL
HGB URINE DIPSTICK: NEGATIVE
Ketones, ur: NEGATIVE mg/dL
Leukocytes, UA: NEGATIVE
NITRITE: NEGATIVE
PROTEIN: NEGATIVE mg/dL
Specific Gravity, Urine: 1.014 (ref 1.005–1.030)
pH: 6 (ref 5.0–8.0)

## 2016-05-11 LAB — BASIC METABOLIC PANEL
ANION GAP: 10 (ref 5–15)
BUN: 22 mg/dL — ABNORMAL HIGH (ref 6–20)
CALCIUM: 8.2 mg/dL — AB (ref 8.9–10.3)
CO2: 26 mmol/L (ref 22–32)
Chloride: 99 mmol/L — ABNORMAL LOW (ref 101–111)
Creatinine, Ser: 1.68 mg/dL — ABNORMAL HIGH (ref 0.61–1.24)
GFR, EST AFRICAN AMERICAN: 44 mL/min — AB (ref >60)
GFR, EST NON AFRICAN AMERICAN: 38 mL/min — AB (ref >60)
Glucose, Bld: 182 mg/dL — ABNORMAL HIGH (ref 65–99)
POTASSIUM: 4.7 mmol/L (ref 3.5–5.1)
SODIUM: 135 mmol/L (ref 135–145)

## 2016-05-11 LAB — GLUCOSE, CAPILLARY: GLUCOSE-CAPILLARY: 208 mg/dL — AB (ref 65–99)

## 2016-05-11 LAB — LACTIC ACID, PLASMA: LACTIC ACID, VENOUS: 1.5 mmol/L (ref 0.5–1.9)

## 2016-05-11 LAB — STREP PNEUMONIAE URINARY ANTIGEN: STREP PNEUMO URINARY ANTIGEN: NEGATIVE

## 2016-05-11 LAB — MRSA PCR SCREENING: MRSA BY PCR: NEGATIVE

## 2016-05-11 MED ORDER — GUAIFENESIN ER 600 MG PO TB12
600.0000 mg | ORAL_TABLET | Freq: Two times a day (BID) | ORAL | Status: DC
  Administered 2016-05-11 – 2016-05-15 (×10): 600 mg via ORAL
  Filled 2016-05-11 (×10): qty 1

## 2016-05-11 MED ORDER — ZOLPIDEM TARTRATE 5 MG PO TABS
5.0000 mg | ORAL_TABLET | Freq: Every evening | ORAL | Status: DC | PRN
  Administered 2016-05-11 – 2016-05-14 (×4): 5 mg via ORAL
  Filled 2016-05-11 (×4): qty 1

## 2016-05-11 MED ORDER — LEVOFLOXACIN IN D5W 750 MG/150ML IV SOLN
750.0000 mg | Freq: Every day | INTRAVENOUS | Status: DC
  Administered 2016-05-11: 750 mg via INTRAVENOUS
  Filled 2016-05-11: qty 150

## 2016-05-11 MED ORDER — IPRATROPIUM-ALBUTEROL 0.5-2.5 (3) MG/3ML IN SOLN
3.0000 mL | Freq: Three times a day (TID) | RESPIRATORY_TRACT | Status: DC
  Administered 2016-05-11 – 2016-05-15 (×11): 3 mL via RESPIRATORY_TRACT
  Filled 2016-05-11 (×13): qty 3

## 2016-05-11 MED ORDER — SENNOSIDES-DOCUSATE SODIUM 8.6-50 MG PO TABS
2.0000 | ORAL_TABLET | Freq: Two times a day (BID) | ORAL | Status: DC
  Administered 2016-05-11 – 2016-05-15 (×7): 2 via ORAL
  Filled 2016-05-11 (×7): qty 2

## 2016-05-11 MED ORDER — LEVOFLOXACIN IN D5W 750 MG/150ML IV SOLN
750.0000 mg | INTRAVENOUS | Status: DC
  Administered 2016-05-12: 750 mg via INTRAVENOUS
  Filled 2016-05-11: qty 150

## 2016-05-11 NOTE — Progress Notes (Signed)
PHARMACY NOTE:  ANTIMICROBIAL RENAL DOSAGE ADJUSTMENT  Current antimicrobial regimen includes a mismatch between antimicrobial dosage and estimated renal function.  As per policy approved by the Pharmacy & Therapeutics and Medical Executive Committees, the antimicrobial dosage will be adjusted accordingly.  Current antimicrobial dosage:  Levaquin 750mg  q24h  Indication: CAP  Renal Function:  Estimated Creatinine Clearance: 45.9 mL/min (by C-G formula based on SCr of 1.68 mg/dL (H)). []      On intermittent HD, scheduled: []      On CRRT    Antimicrobial dosage has been changed to:  Levaquin 750mg  q48h   Arlean Hoppingorey M. Newman PiesBall, PharmD, BCPS Clinical Pharmacist Pager [phone] 05/11/2016 10:25 AM

## 2016-05-11 NOTE — Progress Notes (Signed)
PROGRESS NOTE  Mitchell [name] ZOX:[id number] DOB: 11-21-1940 DOA: 05/10/2016 PCP: Duane Lope, MD  HPI/Recap of past 24 hours:  Still wheezing, productive cough, no fever, no chest pain, no edema Very anxious, labile mood  Assessment/Plan: Principal Problem:   COPD with acute exacerbation (HCC) Active Problems:   Essential hypertension   CAD (coronary artery disease)   History of depression   Chronic respiratory failure (HCC)   Influenza   CKD (chronic kidney disease), stage III  COPD exacerbation  recurrent, after just tapered off steroids cxr no acute findings Sputum culture, respiratory virus panel, urine legionella antigen and urine strep pneumo antigen pending On levaquin, nebs, mucinex, prn o2  Lactic acidosis: on admission, resolved.  Leukocytosis: partly due to copd exacerbation, partly due to steroids  Impaired fasting blood sugar, likely from steroids, will check a1c  CAD  - No anginal complaints  - Troponin is undetectable  - Pt has hx of stent to LAD, follows with cardiology  - Continue daily ASA 325 and Plavix, beta-blocker    CKD stage III  - SCr is 1.43 on admission, slightly up from apparent baseline of ~1.2 , ua unremarkable - Hold Lasix and metolazone on admission; pt was given 1 liter NS in ED  - Repeat chemistries in am, follow daily wts and I/O's   Hypertension - BP at goal on admission  - Continue atenolol as tolerated   Chronic bilateral lower extremity edema, though echocardiogram did not show chf He report pmd started him on lasix and metolozone which greatly helped his edema Today , no edema, clinically dry will continue hold diuretics  bph :  Continue home meds flomax/proscar  Obesity: Body mass index is 30.05 kg/m.  DVT prophylaxis: sq Lovenox  Code Status: Full  Family Communication: patient Disposition Plan: home in 1-2 days pending clinical improvement, will need to follow up with pulmonologist Consults called:  None  Procedures:  none  Antibiotics:  levaquin   Objective: BP 121/67 (BP Location: Right Arm)   Pulse 67   Temp 98 F (36.7 C) (Oral)   Resp 19   Ht 6' (1.829 m)   Wt 100.5 kg (221 lb 9 oz)   SpO2 97%   BMI 30.05 kg/m   Intake/Output Summary (Last 24 hours) at 05/11/16 1545 Last data filed at 05/11/16 1100  Gross per 24 hour  Intake                0 ml  Output              700 ml  Net             -700 ml   Filed Weights   05/10/16 1724 05/11/16 0605  Weight: 102.1 kg (225 lb) 100.5 kg (221 lb 9 oz)    Exam:   General:  Anxious, labile mood  Cardiovascular: RRR  Respiratory: wheezing, more on the right side  Abdomen: Soft/ND/NT, positive BS  Musculoskeletal: No Edema  Neuro: aaox3  Data Reviewed: Basic Metabolic Panel:  Recent Labs Lab 05/10/16 1734 05/11/16 0515  NA 138 135  K 3.5 4.7  CL 96* 99*  CO2 29 26  GLUCOSE 111* 182*  BUN 18 22*  CREATININE 1.43* 1.68*  CALCIUM 8.8* 8.2*   Liver Function Tests:  Recent Labs Lab 05/10/16 1734  AST 18  ALT 25  ALKPHOS 69  BILITOT 0.4  PROT 5.6*  ALBUMIN 2.8*   No results for input(s): LIPASE, AMYLASE in the last  168 hours. No results for input(s): AMMONIA in the last 168 hours. CBC:  Recent Labs Lab 05/10/16 1734  WBC 18.0*  NEUTROABS 14.3*  HGB 14.5  HCT 44.2  MCV 88.8  PLT 202   Cardiac Enzymes:    Recent Labs Lab 05/10/16 2249  TROPONINI <0.03   BNP (last 3 results)  Recent Labs  05/10/16 2249  BNP 80.5    ProBNP (last 3 results) No results for input(s): PROBNP in the last 8760 hours.  CBG:  Recent Labs Lab 05/11/16 0612  GLUCAP 208*    Recent Results (from the past 240 hour(s))  Blood culture (routine x 2)     Status: None (Preliminary result)   Collection Time: 05/10/16  5:34 PM  Result Value Ref Range Status   Specimen Description BLOOD LEFT ANTECUBITAL  Final   Special Requests BOTTLES DRAWN AEROBIC AND ANAEROBIC 10CC  Final   Culture NO GROWTH  < 24 HOURS  Final   Report Status PENDING  Incomplete  Blood culture (routine x 2)     Status: None (Preliminary result)   Collection Time: 05/10/16  9:08 PM  Result Value Ref Range Status   Specimen Description BLOOD RIGHT ARM  Final   Special Requests BOTTLES DRAWN AEROBIC AND ANAEROBIC 5CC  Final   Culture NO GROWTH < 12 HOURS  Final   Report Status PENDING  Incomplete  Culture, expectorated sputum-assessment     Status: None (Preliminary result)   Collection Time: 05/11/16  9:49 AM  Result Value Ref Range Status   Specimen Description EXPECTORATED SPUTUM  Final   Special Requests Immunocompromised  Final   Sputum evaluation THIS SPECIMEN IS ACCEPTABLE FOR SPUTUM CULTURE  Final   Report Status PENDING  Incomplete  Culture, respiratory (NON-Expectorated)     Status: None (Preliminary result)   Collection Time: 05/11/16  9:49 AM  Result Value Ref Range Status   Specimen Description EXPECTORATED SPUTUM  Final   Special Requests Immunocompromised Reflexed from [id number]  Final   Gram Stain   Final    ABUNDANT WBC PRESENT, PREDOMINANTLY PMN MODERATE GRAM NEGATIVE RODS FEW GRAM POSITIVE COCCI IN PAIRS RARE GRAM POSITIVE RODS    Culture PENDING  Incomplete   Report Status PENDING  Incomplete     Studies: Dg Chest Portable 1 View  Result Date: 05/10/2016 CLINICAL DATA:  Possible sepsis. EXAM: PORTABLE CHEST 1 VIEW COMPARISON:  05/10/2016 FINDINGS: 2112 hours. The lungs are clear wiithout focal pneumonia, edema, pneumothorax or pleural effusion. The cardiopericardial silhouette is within normal limits for size. The visualized bony structures of the thorax are intact. Telemetry leads overlie the chest. IMPRESSION: No active disease. Electronically Signed   By: Kennith CenterEric  Mansell M.D.   On: 05/10/2016 21:30    Scheduled Meds: . aspirin EC  325 mg Oral Daily  . atenolol  25 mg Oral q morning - 10a  . clopidogrel  75 mg Oral Q breakfast  . enoxaparin (LOVENOX) injection  40 mg Subcutaneous  Q24H  . finasteride  5 mg Oral Daily  . guaiFENesin  600 mg Oral BID  . ipratropium-albuterol  3 mL Nebulization TID  . [START ON 05/12/2016] levofloxacin (LEVAQUIN) IV  750 mg Intravenous Q48H  . methylPREDNISolone (SOLU-MEDROL) injection  60 mg Intravenous Q6H  . mometasone-formoterol  2 puff Inhalation BID  . oseltamivir  30 mg Oral BID  . polyethylene glycol  17 g Oral BID  . sodium chloride flush  3 mL Intravenous Q12H  . tamsulosin  0.4  mg Oral QPC breakfast    Continuous Infusions:   Time spent:  Juanell Saffo MD, PhD  [facility] Pager [phone]. If 7PM-7AM, please contact night-coverage at [email or web address], password [facility] 05/11/2016, 3:45 PM  LOS: 1 day

## 2016-05-11 NOTE — Care Management Note (Signed)
Case Management Note  Patient Details  Name: Allie BossierJarald Dean Toste MRN: [id number] Date of Birth: 14-Oct-1940  Subjective/Objective:            Patient was sent from his PCP's office for a COPD exacerbation. On home oxygen at 2L at baseline.  Lives at home alone. CM will follow for discharge needs pending patient's progress and physician orders.         Action/Plan:   Expected Discharge Date:                  Expected Discharge Plan:     In-House Referral:     Discharge planning Services     Post Acute Care Choice:    Choice offered to:     DME Arranged:    DME Agency:     HH Arranged:    HH Agency:     Status of Service:     If discussed at [employer] Length of Stay Meetings, dates discussed:    Additional Comments:  Anda KraftRobarge, Leshia Kope C, RN 05/11/2016, 10:33 AM

## 2016-05-12 LAB — BASIC METABOLIC PANEL
Anion gap: 8 (ref 5–15)
BUN: 22 mg/dL — AB (ref 6–20)
CALCIUM: 8.5 mg/dL — AB (ref 8.9–10.3)
CO2: 27 mmol/L (ref 22–32)
Chloride: 101 mmol/L (ref 101–111)
Creatinine, Ser: 1.28 mg/dL — ABNORMAL HIGH (ref 0.61–1.24)
GFR calc non Af Amer: 53 mL/min — ABNORMAL LOW (ref >60)
Glucose, Bld: 194 mg/dL — ABNORMAL HIGH (ref 65–99)
Potassium: 4.1 mmol/L (ref 3.5–5.1)
SODIUM: 136 mmol/L (ref 135–145)

## 2016-05-12 LAB — GLUCOSE, CAPILLARY: Glucose-Capillary: 180 mg/dL — ABNORMAL HIGH (ref 65–99)

## 2016-05-12 LAB — CBC
HCT: 40.6 % (ref 39.0–52.0)
Hemoglobin: 13.4 g/dL (ref 13.0–17.0)
MCH: 28.9 pg (ref 26.0–34.0)
MCHC: 33 g/dL (ref 30.0–36.0)
MCV: 87.7 fL (ref 78.0–100.0)
Platelets: 173 10*3/uL (ref 150–400)
RBC: 4.63 MIL/uL (ref 4.22–5.81)
RDW: 15.1 % (ref 11.5–15.5)
WBC: 20.5 10*3/uL — ABNORMAL HIGH (ref 4.0–10.5)

## 2016-05-12 LAB — EXPECTORATED SPUTUM ASSESSMENT W GRAM STAIN, RFLX TO RESP C

## 2016-05-12 LAB — EXPECTORATED SPUTUM ASSESSMENT W REFEX TO RESP CULTURE

## 2016-05-12 LAB — RESPIRATORY PANEL BY PCR
ADENOVIRUS-RVPPCR: NOT DETECTED
Bordetella pertussis: NOT DETECTED
CHLAMYDOPHILA PNEUMONIAE-RVPPCR: NOT DETECTED
CORONAVIRUS 229E-RVPPCR: NOT DETECTED
CORONAVIRUS OC43-RVPPCR: NOT DETECTED
Coronavirus HKU1: NOT DETECTED
Coronavirus NL63: NOT DETECTED
INFLUENZA A-RVPPCR: NOT DETECTED
INFLUENZA B-RVPPCR: NOT DETECTED
Metapneumovirus: NOT DETECTED
Mycoplasma pneumoniae: NOT DETECTED
PARAINFLUENZA VIRUS 1-RVPPCR: NOT DETECTED
PARAINFLUENZA VIRUS 4-RVPPCR: NOT DETECTED
Parainfluenza Virus 2: NOT DETECTED
Parainfluenza Virus 3: NOT DETECTED
RESPIRATORY SYNCYTIAL VIRUS-RVPPCR: NOT DETECTED
Rhinovirus / Enterovirus: NOT DETECTED

## 2016-05-12 NOTE — Evaluation (Signed)
Physical Therapy Evaluation Patient Details Name: Mitchell [name] MRN: [id number] DOB: [birth year] Today's Date: 05/12/2016   History of Present Illness  76 y.o. male with a history of COPD, CAD/NSTEMI, and HTN presenting with dyspnea and cough. Flu +  Clinical Impression  Patient demonstrates deficits in functional mobility as indicated below. Will need continued skilled PT to address deficits and maximize function. Will see as indicated and progress as tolerated.   OF NOTE: ambulated patient on room air with saturations dropping to uppers 80s, improved on 2 liters >93%. 97% on 2 liters at rest.   Educated patient on energy conservation techniques    Follow Up Recommendations Home health PT    Equipment Recommendations  None recommended by PT    Recommendations for Other Services       Precautions / Restrictions Precautions Precautions: Fall Precaution Comments: visual deficits per pt (watch O2 saturations and FLU +) Restrictions Weight Bearing Restrictions: No      Mobility  Bed Mobility               General bed mobility comments: recieved in chair  Transfers Overall transfer level: Needs assistance Equipment used: Rolling walker (2 wheeled) Transfers: Sit to/from Stand Sit to Stand: Supervision         General transfer comment: No physical assist, VCs fors hand placement  Ambulation/Gait Ambulation/Gait assistance: Min guard;Supervision Ambulation Distance (Feet): 440 Feet Assistive device: Rolling walker (2 wheeled) Gait Pattern/deviations: Step-through pattern;Decreased stride length Gait velocity: decreased Gait velocity interpretation: Below normal speed for age/gender General Gait Details: min guard for safety, increased time and effort to perform, multiple extended rest breaks throughout ambulation. Ambulated on room air, desaturation to 88% with activity. 93 % on 2 liters  Stairs            Wheelchair Mobility    Modified Rankin  (Stroke Patients Only)       Balance Overall balance assessment: Needs assistance   Sitting balance-Leahy Scale: Good       Standing balance-Leahy Scale: Fair Standing balance comment: use of RW, but could take static standing rest breaks without UE support                             Pertinent Vitals/Pain Pain Assessment: No/denies pain    Home Living Family/patient expects to be discharged to:: Private residence Living Arrangements: Alone   Type of Home: Apartment Home Access: Stairs to enter   [employer] of Steps: 1 Home Layout: One level Home Equipment: Cane - single point;Grab bars - tub/shower;Grab bars - toilet      Prior Function Level of Independence: Independent with assistive device(s)         Comments: uses cane for ambulation     Hand Dominance   Dominant Hand: Right    Extremity/Trunk Assessment   Upper Extremity Assessment Upper Extremity Assessment: Generalized weakness    Lower Extremity Assessment Lower Extremity Assessment: Generalized weakness       Communication   Communication: No difficulties  Cognition Arousal/Alertness: Awake/alert Behavior During Therapy: WFL for tasks assessed/performed Overall Cognitive Status: Within Functional Limits for tasks assessed                      General Comments      Exercises     Assessment/Plan    PT Assessment Patient needs continued PT services  PT Problem List Decreased mobility;Decreased activity tolerance;Decreased balance;Decreased knowledge  of use of DME;Pain;Decreased strength          PT Treatment Interventions DME instruction;Gait training;Therapeutic activities;Therapeutic exercise;Patient/family education;Functional mobility training;Balance training    PT Goals (Current goals can be found in the Care Plan section)  Acute Rehab PT Goals Patient Stated Goal: to get better PT Goal Formulation: With patient Time For Goal Achievement:  05/26/16 Potential to Achieve Goals: Good    Frequency Min 3X/week   Barriers to discharge        Co-evaluation               End of Session Equipment Utilized During Treatment: Gait belt;Oxygen Activity Tolerance: Patient tolerated treatment well Patient left: with call bell/phone within reach;with family/visitor present Nurse Communication: Mobility status         Time: [id number] PT Time Calculation (min) (ACUTE ONLY): 23 min   Charges:   PT Evaluation $PT Eval Moderate Complexity: 1 Procedure PT Treatments $Gait Training: 8-22 mins   PT G Codes:        Fabio AsaDevon J Tityana Pagan 05/12/2016, 3:48 PM Charlotte Crumbevon Dak Szumski, PT DPT  [phone]

## 2016-05-12 NOTE — Progress Notes (Addendum)
PROGRESS NOTE  Mitchell [name] UJW:[id number] DOB: April 25, 1940 DOA: 05/10/2016 PCP: Duane LopeAlan Ross, MD  HPI/Recap of past 24 hours:  Still wheezing, productive cough, no fever, no chest pain, no edema, no bm for the last two days Very anxious, labile mood Ex wife at bedside  Assessment/Plan: Principal Problem:   COPD with acute exacerbation (HCC) Active Problems:   Essential hypertension   CAD (coronary artery disease)   History of depression   Chronic respiratory failure (HCC)   Influenza   CKD (chronic kidney disease), stage III  COPD exacerbation  recurrent, after just tapered off steroids cxr no acute findings Sputum culture pending, respiratory virus panel negative, urine legionella antigen pending,  urine strep pneumo antigen negative, mrsa screening negative On levaquin, nebs, mucinex, prn o2 On exam wheezing more prominent on right lung fields, if symptom persist consider CT chest   Hypoxic respiratory failure, he was sent home on home o2 from recent hospitalization a week ago  Lactic acidosis: on admission, resolved.  Leukocytosis: partly due to copd exacerbation, partly due to steroids  Impaired fasting blood sugar, likely from steroids,  a1c pending  AKI on CKD stage III ,ua unremarkable - cr peaked at 1.68, cr 1.28 on 1/27,   - Hold Lasix and metolazone on admission; pt was given 1 liter NS in ED  - renal dosing meds, daily wts and I/O's   Chronic bilateral lower extremity edema, though echocardiogram did not show chf He report pmd started him on lasix and metolozone which greatly helped his edema Today , no edema, clinically dry will continue hold diuretics  CAD Pt has hx of stent to LAD, follows with cardiology - No anginal complaints  - Troponin is undetectable  -  Continue daily ASA 325 and Plavix, beta-blocker     Hypertension - BP at goal on admission  - Continue atenolol as tolerated    BPH :  Continue home meds  flomax/proscar  Obesity: Body mass index is 29 kg/m.   FTT: PT  DVT prophylaxis: sq Lovenox  Code Status: Full  Family Communication: patient and exwife at bedside Disposition Plan: home, likely need home health  in 1-2 days pending clinical improvement, will need to ambulate check o2 sat prior to discharge, will need to follow up with pulmonologist Consults called: None  Procedures:  none  Antibiotics:  levaquin   Objective: BP 136/68 (BP Location: Right Arm)   Pulse 87   Temp 98.2 F (36.8 C) (Oral)   Resp 18   Ht 6' (1.829 m)   Wt 97 kg (213 lb 13.5 oz)   SpO2 95%   BMI 29.00 kg/m   Intake/Output Summary (Last 24 hours) at 05/12/16 1545 Last data filed at 05/12/16 1349  Gross per 24 hour  Intake                0 ml  Output              850 ml  Net             -850 ml   Filed Weights   05/10/16 1724 05/11/16 0605 05/12/16 0500  Weight: 102.1 kg (225 lb) 100.5 kg (221 lb 9 oz) 97 kg (213 lb 13.5 oz)    Exam:   General:  Anxious, labile mood  Cardiovascular: RRR  Respiratory: wheezing, more on the right side  Abdomen: Soft/ND/NT, positive BS  Musculoskeletal: No Edema  Neuro: aaox3  Data Reviewed: Basic Metabolic Panel:  Recent Labs  Lab 05/10/16 1734 05/11/16 0515 05/12/16 0715  NA 138 135 136  K 3.5 4.7 4.1  CL 96* 99* 101  CO2 29 26 27   GLUCOSE 111* 182* 194*  BUN 18 22* 22*  CREATININE 1.43* 1.68* 1.28*  CALCIUM 8.8* 8.2* 8.5*   Liver Function Tests:  Recent Labs Lab 05/10/16 1734  AST 18  ALT 25  ALKPHOS 69  BILITOT 0.4  PROT 5.6*  ALBUMIN 2.8*   No results for input(s): LIPASE, AMYLASE in the last 168 hours. No results for input(s): AMMONIA in the last 168 hours. CBC:  Recent Labs Lab 05/10/16 1734 05/12/16 0715  WBC 18.0* 20.5*  NEUTROABS 14.3*  --   HGB 14.5 13.4  HCT 44.2 40.6  MCV 88.8 87.7  PLT 202 173   Cardiac Enzymes:    Recent Labs Lab 05/10/16 2249  TROPONINI <0.03   BNP (last 3  results)  Recent Labs  05/10/16 2249  BNP 80.5    ProBNP (last 3 results) No results for input(s): PROBNP in the last 8760 hours.  CBG:  Recent Labs Lab 05/11/16 0612 05/12/16 0657  GLUCAP 208* 180*    Recent Results (from the past 240 hour(s))  Blood culture (routine x 2)     Status: None (Preliminary result)   Collection Time: 05/10/16  5:34 PM  Result Value Ref Range Status   Specimen Description BLOOD LEFT ANTECUBITAL  Final   Special Requests BOTTLES DRAWN AEROBIC AND ANAEROBIC 10CC  Final   Culture NO GROWTH 2 DAYS  Final   Report Status PENDING  Incomplete  Blood culture (routine x 2)     Status: None (Preliminary result)   Collection Time: 05/10/16  9:08 PM  Result Value Ref Range Status   Specimen Description BLOOD RIGHT ARM  Final   Special Requests BOTTLES DRAWN AEROBIC AND ANAEROBIC 5CC  Final   Culture NO GROWTH 2 DAYS  Final   Report Status PENDING  Incomplete  Culture, expectorated sputum-assessment     Status: None   Collection Time: 05/11/16  9:49 AM  Result Value Ref Range Status   Specimen Description EXPECTORATED SPUTUM  Final   Special Requests Immunocompromised  Final   Sputum evaluation THIS SPECIMEN IS ACCEPTABLE FOR SPUTUM CULTURE  Final   Report Status 05/12/2016 FINAL  Final  Culture, respiratory (NON-Expectorated)     Status: None (Preliminary result)   Collection Time: 05/11/16  9:49 AM  Result Value Ref Range Status   Specimen Description EXPECTORATED SPUTUM  Final   Special Requests Immunocompromised Reflexed from [id number]  Final   Gram Stain   Final    ABUNDANT WBC PRESENT, PREDOMINANTLY PMN MODERATE GRAM NEGATIVE RODS FEW GRAM POSITIVE COCCI IN PAIRS RARE GRAM POSITIVE RODS    Culture CULTURE REINCUBATED FOR BETTER GROWTH  Final   Report Status PENDING  Incomplete  MRSA PCR Screening     Status: None   Collection Time: 05/11/16  3:40 PM  Result Value Ref Range Status   MRSA by PCR NEGATIVE NEGATIVE Final    Comment:        The  GeneXpert MRSA Assay (FDA approved for NASAL specimens only), is one component of a comprehensive MRSA colonization surveillance program. It is not intended to diagnose MRSA infection nor to guide or monitor treatment for MRSA infections.   Respiratory Panel by PCR     Status: None   Collection Time: 05/11/16  3:41 PM  Result Value Ref Range Status   Adenovirus NOT DETECTED NOT  DETECTED Final   Coronavirus 229E NOT DETECTED NOT DETECTED Final   Coronavirus HKU1 NOT DETECTED NOT DETECTED Final   Coronavirus NL63 NOT DETECTED NOT DETECTED Final   Coronavirus OC43 NOT DETECTED NOT DETECTED Final   Metapneumovirus NOT DETECTED NOT DETECTED Final   Rhinovirus / Enterovirus NOT DETECTED NOT DETECTED Final   Influenza A NOT DETECTED NOT DETECTED Final   Influenza B NOT DETECTED NOT DETECTED Final   Parainfluenza Virus 1 NOT DETECTED NOT DETECTED Final   Parainfluenza Virus 2 NOT DETECTED NOT DETECTED Final   Parainfluenza Virus 3 NOT DETECTED NOT DETECTED Final   Parainfluenza Virus 4 NOT DETECTED NOT DETECTED Final   Respiratory Syncytial Virus NOT DETECTED NOT DETECTED Final   Bordetella pertussis NOT DETECTED NOT DETECTED Final   Chlamydophila pneumoniae NOT DETECTED NOT DETECTED Final   Mycoplasma pneumoniae NOT DETECTED NOT DETECTED Final     Studies: No results found.  Scheduled Meds: . aspirin EC  325 mg Oral Daily  . atenolol  25 mg Oral q morning - 10a  . clopidogrel  75 mg Oral Q breakfast  . enoxaparin (LOVENOX) injection  40 mg Subcutaneous Q24H  . finasteride  5 mg Oral Daily  . guaiFENesin  600 mg Oral BID  . ipratropium-albuterol  3 mL Nebulization TID  . levofloxacin (LEVAQUIN) IV  750 mg Intravenous Q48H  . methylPREDNISolone (SOLU-MEDROL) injection  60 mg Intravenous Q6H  . mometasone-formoterol  2 puff Inhalation BID  . oseltamivir  30 mg Oral BID  . polyethylene glycol  17 g Oral BID  . senna-docusate  2 tablet Oral BID  . sodium chloride flush  3 mL  Intravenous Q12H  . tamsulosin  0.4 mg Oral QPC breakfast    Continuous Infusions:   Time spent:  [place] Welcome MD, PhD  [facility] Pager [phone]. If 7PM-7AM, please contact night-coverage at [email or web address], password [facility] 05/12/2016, 3:45 PM  LOS: 2 days

## 2016-05-13 ENCOUNTER — Inpatient Hospital Stay (HOSPITAL_COMMUNITY): Payer: Medicare PPO

## 2016-05-13 LAB — CBC WITH DIFFERENTIAL/PLATELET
BASOS ABS: 0 10*3/uL (ref 0.0–0.1)
BASOS PCT: 0 %
EOS ABS: 0 10*3/uL (ref 0.0–0.7)
EOS PCT: 0 %
HCT: 40 % (ref 39.0–52.0)
Hemoglobin: 13 g/dL (ref 13.0–17.0)
Lymphocytes Relative: 3 %
Lymphs Abs: 0.7 10*3/uL (ref 0.7–4.0)
MCH: 28.6 pg (ref 26.0–34.0)
MCHC: 32.5 g/dL (ref 30.0–36.0)
MCV: 88.1 fL (ref 78.0–100.0)
MONO ABS: 0.7 10*3/uL (ref 0.1–1.0)
Monocytes Relative: 3 %
Neutro Abs: 18.7 10*3/uL — ABNORMAL HIGH (ref 1.7–7.7)
Neutrophils Relative %: 94 %
PLATELETS: 182 10*3/uL (ref 150–400)
RBC: 4.54 MIL/uL (ref 4.22–5.81)
RDW: 15.3 % (ref 11.5–15.5)
WBC: 20 10*3/uL — AB (ref 4.0–10.5)

## 2016-05-13 LAB — HEMOGLOBIN A1C
HEMOGLOBIN A1C: 6 % — AB (ref 4.8–5.6)
Mean Plasma Glucose: 126 mg/dL

## 2016-05-13 LAB — BASIC METABOLIC PANEL
ANION GAP: 8 (ref 5–15)
BUN: 27 mg/dL — ABNORMAL HIGH (ref 6–20)
CALCIUM: 8.5 mg/dL — AB (ref 8.9–10.3)
CO2: 27 mmol/L (ref 22–32)
Chloride: 101 mmol/L (ref 101–111)
Creatinine, Ser: 1.36 mg/dL — ABNORMAL HIGH (ref 0.61–1.24)
GFR, EST AFRICAN AMERICAN: 57 mL/min — AB (ref >60)
GFR, EST NON AFRICAN AMERICAN: 49 mL/min — AB (ref >60)
Glucose, Bld: 181 mg/dL — ABNORMAL HIGH (ref 65–99)
Potassium: 3.9 mmol/L (ref 3.5–5.1)
SODIUM: 136 mmol/L (ref 135–145)

## 2016-05-13 LAB — CULTURE, RESPIRATORY W GRAM STAIN: Culture: NORMAL

## 2016-05-13 LAB — GLUCOSE, CAPILLARY: GLUCOSE-CAPILLARY: 188 mg/dL — AB (ref 65–99)

## 2016-05-13 LAB — CULTURE, RESPIRATORY

## 2016-05-13 MED ORDER — ENOXAPARIN SODIUM 60 MG/0.6ML ~~LOC~~ SOLN
50.0000 mg | SUBCUTANEOUS | Status: DC
  Administered 2016-05-14 – 2016-05-15 (×2): 50 mg via SUBCUTANEOUS
  Filled 2016-05-13 (×2): qty 0.6

## 2016-05-13 MED ORDER — LEVOFLOXACIN IN D5W 750 MG/150ML IV SOLN
750.0000 mg | INTRAVENOUS | Status: DC
  Administered 2016-05-14: 750 mg via INTRAVENOUS
  Filled 2016-05-13: qty 150

## 2016-05-13 NOTE — Progress Notes (Signed)
PHARMACY NOTE:  ANTIMICROBIAL RENAL DOSAGE ADJUSTMENT  Current antimicrobial regimen includes a mismatch between antimicrobial dosage and estimated renal function.  As per policy approved by the Pharmacy & Therapeutics and Medical Executive Committees, the antimicrobial dosage will be adjusted accordingly.  Current antimicrobial dosage:  Levaquin 750mg  q48h  Indication: CAP  Renal Function:  Estimated Creatinine Clearance: 57.4 mL/min (by C-G formula based on SCr of 1.36 mg/dL (H)). []      On intermittent HD, scheduled: []      On CRRT    Antimicrobial dosage has been changed to:  Levaquin 750mg  q24h   Pollyann SamplesAndy Endia Moncur, PharmD, BCPS 05/13/2016, 2:02 PM

## 2016-05-13 NOTE — Progress Notes (Signed)
Pt had a small BM with small blood in it, said to be new, no hx of hemorrhoids, pt reassured, will continue to monitor. Obasogie-Asidi, Evanie Buckle Efe

## 2016-05-13 NOTE — Progress Notes (Signed)
PROGRESS NOTE  Mitchell [name] RUE:[id number] DOB: 06-04-1940 DOA: 05/10/2016 PCP: Duane Lope, MD  HPI/Recap of past 24 hours:  Still wheezing, productive cough,  no fever, no chest pain,  Trace lower extremity pitting  edema,  Had bm this am no blood , report blood on tissue when he wipes, c/p lower abdominal /suprapubuc pain  Report feeling dizzy upon standing up Very anxious, labile mood Ex wife at bedside  Assessment/Plan: Principal Problem:   COPD with acute exacerbation (HCC) Active Problems:   Essential hypertension   CAD (coronary artery disease)   History of depression   Chronic respiratory failure (HCC)   Influenza   CKD (chronic kidney disease), stage III  COPD exacerbation  recurrent, after just tapered off steroids cxr no acute findings Sputum culture with oral flora, respiratory virus panel negative, urine legionella antigen pending,  urine strep pneumo antigen negative, mrsa screening negative On levaquin, nebs, mucinex, prn o2 On exam wheezing more prominent on right lung fields,  persist , will get CT chest   Hypoxic respiratory failure, he was sent home on home o2 from recent hospitalization a week ago  Lactic acidosis: on admission, resolved.  Leukocytosis: partly due to copd exacerbation, partly due to steroids  Impaired fasting blood sugar, likely from steroids,  a1c pending  AKI on CKD stage III ,ua unremarkable - cr peaked at 1.68, cr 1.28 on 1/27,   - Hold Lasix and metolazone on admission; pt was given 1 liter NS in ED  - renal dosing meds, daily wts and I/O's   Chronic bilateral lower extremity edema, though echocardiogram did not show chf He report pmd started him on lasix and metolozone which greatly helped his edema Today , no edema, clinically dry will continue hold diuretics  CAD Pt has hx of stent to LAD, follows with cardiology - No anginal complaints  - Troponin is undetectable  -  Continue daily ASA 325 and Plavix,  beta-blocker     Hypertension - BP at goal on admission  - Continue atenolol as tolerated    BPH :  Continue home meds flomax/proscar  Obesity: Body mass index is 30.8 kg/m.   FTT: PT  DVT prophylaxis: sq Lovenox  Code Status: Full  Family Communication: patient and exwife at bedside Disposition Plan: home with home health, Monday or Tuesday   pending clinical improvement, will need to ambulate check o2 sat prior to discharge, will need to follow up with pulmonologist Consults called: None  Procedures:  none  Antibiotics:  levaquin   Objective: BP (!) 129/109 (BP Location: Right Arm)   Pulse 67   Temp 97.5 F (36.4 C) (Oral)   Resp 20   Ht 6' (1.829 m)   Wt 103 kg (227 lb 1.2 oz)   SpO2 97%   BMI 30.80 kg/m   Intake/Output Summary (Last 24 hours) at 05/13/16 0827 Last data filed at 05/13/16 0324  Gross per 24 hour  Intake              510 ml  Output              600 ml  Net              -90 ml   Filed Weights   05/11/16 0605 05/12/16 0500 05/13/16 0501  Weight: 100.5 kg (221 lb 9 oz) 97 kg (213 lb 13.5 oz) 103 kg (227 lb 1.2 oz)    Exam:   General:  Anxious, labile mood  Cardiovascular: RRR  Respiratory: wheezing, more on the right side  Abdomen: Soft/ND/NT, positive BS  Musculoskeletal: trace pitting Edema bilateral lower extremity  Neuro: aaox3  Data Reviewed: Basic Metabolic Panel:  Recent Labs Lab 05/10/16 1734 05/11/16 0515 05/12/16 0715 05/13/16 0136  NA 138 135 136 136  K 3.5 4.7 4.1 3.9  CL 96* 99* 101 101  CO2 29 26 27 27   GLUCOSE 111* 182* 194* 181*  BUN 18 22* 22* 27*  CREATININE 1.43* 1.68* 1.28* 1.36*  CALCIUM 8.8* 8.2* 8.5* 8.5*   Liver Function Tests:  Recent Labs Lab 05/10/16 1734  AST 18  ALT 25  ALKPHOS 69  BILITOT 0.4  PROT 5.6*  ALBUMIN 2.8*   No results for input(s): LIPASE, AMYLASE in the last 168 hours. No results for input(s): AMMONIA in the last 168 hours. CBC:  Recent Labs Lab  05/10/16 1734 05/12/16 0715 05/13/16 0136  WBC 18.0* 20.5* 20.0*  NEUTROABS 14.3*  --  18.7*  HGB 14.5 13.4 13.0  HCT 44.2 40.6 40.0  MCV 88.8 87.7 88.1  PLT 202 173 182   Cardiac Enzymes:    Recent Labs Lab 05/10/16 2249  TROPONINI <0.03   BNP (last 3 results)  Recent Labs  05/10/16 2249  BNP 80.5    ProBNP (last 3 results) No results for input(s): PROBNP in the last 8760 hours.  CBG:  Recent Labs Lab 05/11/16 0612 05/12/16 0657 05/13/16 0616  GLUCAP 208* 180* 188*    Recent Results (from the past 240 hour(s))  Blood culture (routine x 2)     Status: None (Preliminary result)   Collection Time: 05/10/16  5:34 PM  Result Value Ref Range Status   Specimen Description BLOOD LEFT ANTECUBITAL  Final   Special Requests BOTTLES DRAWN AEROBIC AND ANAEROBIC 10CC  Final   Culture NO GROWTH 2 DAYS  Final   Report Status PENDING  Incomplete  Blood culture (routine x 2)     Status: None (Preliminary result)   Collection Time: 05/10/16  9:08 PM  Result Value Ref Range Status   Specimen Description BLOOD RIGHT ARM  Final   Special Requests BOTTLES DRAWN AEROBIC AND ANAEROBIC 5CC  Final   Culture NO GROWTH 2 DAYS  Final   Report Status PENDING  Incomplete  Culture, expectorated sputum-assessment     Status: None   Collection Time: 05/11/16  9:49 AM  Result Value Ref Range Status   Specimen Description EXPECTORATED SPUTUM  Final   Special Requests Immunocompromised  Final   Sputum evaluation THIS SPECIMEN IS ACCEPTABLE FOR SPUTUM CULTURE  Final   Report Status 05/12/2016 FINAL  Final  Culture, respiratory (NON-Expectorated)     Status: None (Preliminary result)   Collection Time: 05/11/16  9:49 AM  Result Value Ref Range Status   Specimen Description EXPECTORATED SPUTUM  Final   Special Requests Immunocompromised Reflexed from [id number]  Final   Gram Stain   Final    ABUNDANT WBC PRESENT, PREDOMINANTLY PMN MODERATE GRAM NEGATIVE RODS FEW GRAM POSITIVE COCCI IN  PAIRS RARE GRAM POSITIVE RODS    Culture CULTURE REINCUBATED FOR BETTER GROWTH  Final   Report Status PENDING  Incomplete  MRSA PCR Screening     Status: None   Collection Time: 05/11/16  3:40 PM  Result Value Ref Range Status   MRSA by PCR NEGATIVE NEGATIVE Final    Comment:        The GeneXpert MRSA Assay (FDA approved for NASAL specimens only), is one component  of a comprehensive MRSA colonization surveillance program. It is not intended to diagnose MRSA infection nor to guide or monitor treatment for MRSA infections.   Respiratory Panel by PCR     Status: None   Collection Time: 05/11/16  3:41 PM  Result Value Ref Range Status   Adenovirus NOT DETECTED NOT DETECTED Final   Coronavirus 229E NOT DETECTED NOT DETECTED Final   Coronavirus HKU1 NOT DETECTED NOT DETECTED Final   Coronavirus NL63 NOT DETECTED NOT DETECTED Final   Coronavirus OC43 NOT DETECTED NOT DETECTED Final   Metapneumovirus NOT DETECTED NOT DETECTED Final   Rhinovirus / Enterovirus NOT DETECTED NOT DETECTED Final   Influenza A NOT DETECTED NOT DETECTED Final   Influenza B NOT DETECTED NOT DETECTED Final   Parainfluenza Virus 1 NOT DETECTED NOT DETECTED Final   Parainfluenza Virus 2 NOT DETECTED NOT DETECTED Final   Parainfluenza Virus 3 NOT DETECTED NOT DETECTED Final   Parainfluenza Virus 4 NOT DETECTED NOT DETECTED Final   Respiratory Syncytial Virus NOT DETECTED NOT DETECTED Final   Bordetella pertussis NOT DETECTED NOT DETECTED Final   Chlamydophila pneumoniae NOT DETECTED NOT DETECTED Final   Mycoplasma pneumoniae NOT DETECTED NOT DETECTED Final     Studies: No results found.  Scheduled Meds: . aspirin EC  325 mg Oral Daily  . atenolol  25 mg Oral q morning - 10a  . clopidogrel  75 mg Oral Q breakfast  . enoxaparin (LOVENOX) injection  40 mg Subcutaneous Q24H  . finasteride  5 mg Oral Daily  . guaiFENesin  600 mg Oral BID  . ipratropium-albuterol  3 mL Nebulization TID  . levofloxacin  (LEVAQUIN) IV  750 mg Intravenous Q48H  . methylPREDNISolone (SOLU-MEDROL) injection  60 mg Intravenous Q6H  . mometasone-formoterol  2 puff Inhalation BID  . oseltamivir  30 mg Oral BID  . polyethylene glycol  17 g Oral BID  . senna-docusate  2 tablet Oral BID  . sodium chloride flush  3 mL Intravenous Q12H  . tamsulosin  0.4 mg Oral QPC breakfast    Continuous Infusions:   Time spent: 25mins  Ladeja Pelham MD, PhD  [facility] Pager [phone]. If 7PM-7AM, please contact night-coverage at [email or web address], password [email or web address] 05/13/2016, 8:27 AM  LOS: 3 days

## 2016-05-14 LAB — LEGIONELLA PNEUMOPHILA SEROGP 1 UR AG: L. pneumophila Serogp 1 Ur Ag: NEGATIVE

## 2016-05-14 LAB — BASIC METABOLIC PANEL
Anion gap: 6 (ref 5–15)
BUN: 24 mg/dL — ABNORMAL HIGH (ref 6–20)
CALCIUM: 8.4 mg/dL — AB (ref 8.9–10.3)
CO2: 28 mmol/L (ref 22–32)
Chloride: 102 mmol/L (ref 101–111)
Creatinine, Ser: 1.35 mg/dL — ABNORMAL HIGH (ref 0.61–1.24)
GFR calc non Af Amer: 49 mL/min — ABNORMAL LOW (ref >60)
GFR, EST AFRICAN AMERICAN: 57 mL/min — AB (ref >60)
GLUCOSE: 152 mg/dL — AB (ref 65–99)
Potassium: 4.3 mmol/L (ref 3.5–5.1)
Sodium: 136 mmol/L (ref 135–145)

## 2016-05-14 LAB — URINALYSIS, ROUTINE W REFLEX MICROSCOPIC
BACTERIA UA: NONE SEEN
BILIRUBIN URINE: NEGATIVE
Glucose, UA: 50 mg/dL — AB
HGB URINE DIPSTICK: NEGATIVE
KETONES UR: NEGATIVE mg/dL
LEUKOCYTES UA: NEGATIVE
NITRITE: NEGATIVE
Protein, ur: NEGATIVE mg/dL
Specific Gravity, Urine: 1.024 (ref 1.005–1.030)
Squamous Epithelial / LPF: NONE SEEN
WBC, UA: NONE SEEN WBC/hpf (ref 0–5)
pH: 5 (ref 5.0–8.0)

## 2016-05-14 LAB — CBC
HCT: 40.2 % (ref 39.0–52.0)
Hemoglobin: 13.2 g/dL (ref 13.0–17.0)
MCH: 28.9 pg (ref 26.0–34.0)
MCHC: 32.8 g/dL (ref 30.0–36.0)
MCV: 88.2 fL (ref 78.0–100.0)
Platelets: 170 10*3/uL (ref 150–400)
RBC: 4.56 MIL/uL (ref 4.22–5.81)
RDW: 15.5 % (ref 11.5–15.5)
WBC: 14.6 10*3/uL — ABNORMAL HIGH (ref 4.0–10.5)

## 2016-05-14 LAB — URINE CULTURE: CULTURE: NO GROWTH

## 2016-05-14 LAB — GLUCOSE, CAPILLARY: GLUCOSE-CAPILLARY: 190 mg/dL — AB (ref 65–99)

## 2016-05-14 MED ORDER — LEVOFLOXACIN 750 MG PO TABS
750.0000 mg | ORAL_TABLET | Freq: Every day | ORAL | Status: DC
  Administered 2016-05-14: 750 mg via ORAL
  Filled 2016-05-14 (×2): qty 1

## 2016-05-14 MED ORDER — FUROSEMIDE 40 MG PO TABS
40.0000 mg | ORAL_TABLET | Freq: Two times a day (BID) | ORAL | Status: DC
  Administered 2016-05-14 – 2016-05-15 (×2): 40 mg via ORAL
  Filled 2016-05-14 (×2): qty 1

## 2016-05-14 MED ORDER — FAMOTIDINE 20 MG PO TABS
20.0000 mg | ORAL_TABLET | Freq: Two times a day (BID) | ORAL | Status: DC
  Administered 2016-05-14 – 2016-05-15 (×3): 20 mg via ORAL
  Filled 2016-05-14 (×3): qty 1

## 2016-05-14 MED ORDER — METHYLPREDNISOLONE SODIUM SUCC 125 MG IJ SOLR
60.0000 mg | Freq: Two times a day (BID) | INTRAMUSCULAR | Status: DC
  Administered 2016-05-15 (×2): 60 mg via INTRAVENOUS
  Filled 2016-05-14 (×2): qty 2

## 2016-05-14 NOTE — Care Management Note (Signed)
Case Management Note  Patient Details  Name: Mitchell [name] MRN: [id number] Date of Birth: [birth year]  Subjective/Objective:                    Action/Plan[facility] following for [facility]/c needs.   Expected Discharge Date:   (Pending)               Expected Discharge Plan:  Home w Home Health Services  In-House Referral:     Discharge planning Services     Post Acute Care Choice:    Choice offered to:     DME Arranged:    DME Agency:     HH Arranged:    HH Agency:     Status of Service:  In process, will continue to follow  If discussed at Long Length of Stay Meetings, dates discussed:    Additional Comments:  Kermit BaloKelli F Cross Jorge, RN 05/14/2016, 1:31 PM

## 2016-05-14 NOTE — Progress Notes (Signed)
PROGRESS NOTE  Mitchell [name] MWN:[id number] DOB: [birth year] DOA: 05/10/2016 PCP: Duane LopeAlan Ross, MD  HPI/Recap of past 24 hours:   no fever, no chest pain, c/o congested cough, chocking episode this am when he coughs Trace lower extremity pitting  edema,  very anxious, labile mood, reluctantly admit feeling a little better  Assessment/Plan: Principal Problem:   COPD with acute exacerbation (HCC) Active Problems:   Essential hypertension   CAD (coronary artery disease)   History of depression   Chronic respiratory failure (HCC)   Influenza   CKD (chronic kidney disease), stage III  COPD exacerbation  recurrent, after just tapered off steroids cxr no acute findings Sputum culture with oral flora, respiratory virus panel negative, urine legionella antigen negative,  urine strep pneumo antigen negative, mrsa screening negative On levaquin, nebs, mucinex, prn o2 CT chest  On 1/28"Hyperinflation of the lungs consistent with COPD. Minimal peribronchial airspace opacities in bilateral lower lobes may represent atelectasis versus peribronchial airspace consolidation." He is improving clinically, taper steroids, likely d/c on 1/30.  Hypoxic respiratory failure, he was sent home on home o2 from recent hospitalization a week ago. Ambulating on room air, o2 sat at 92% per PT.  Lactic acidosis: on admission, resolved.  Leukocytosis: partly due to copd exacerbation, partly due to steroids  Impaired fasting blood sugar, likely from steroids,  a1c 6.0.  AKI on CKD stage III ,ua unremarkable - cr peaked at 1.68, cr 1.28 on 1/27,   -he was given 1liter ns in the ED,  Lasix and metolazone held since admission;   - renal dosing meds, daily wts and I/O's  -trace pitting edema lower extremity, cr improving, restart lasix on 1/29  Chronic bilateral lower extremity edema, though echocardiogram did not show chf He report pmd started him on lasix and metolozone which greatly helped his  edema Today , trace edema, restart home meds lasix on 1/29  CAD Pt has hx of stent to LAD, follows with cardiology - No anginal complaints  - Troponin is undetectable  -  Continue daily ASA 325 and Plavix, beta-blocker     Hypertension - BP at goal on admission  - Continue atenolol as tolerated    BPH :  Continue home meds flomax/proscar  Obesity: Body mass index is 30.8 kg/m.   FTT: PT/home health  DVT prophylaxis: sq Lovenox  Code Status: Full  Family Communication: patient and exwife at bedside Disposition Plan: home with home health on 1/30, will need to follow up with pulmonologist Consults called: None  Procedures:  none  Antibiotics:  levaquin   Objective: BP 127/69 (BP Location: Right Arm)   Pulse 69   Temp 97.9 F (36.6 C) (Oral)   Resp 20   Ht 6' (1.829 m)   Wt 103 kg (227 lb 1.2 oz)   SpO2 95%   BMI 30.80 kg/m   Intake/Output Summary (Last 24 hours) at 05/14/16 0807 Last data filed at 05/14/16 0320  Gross per 24 hour  Intake              150 ml  Output              300 ml  Net             -150 ml   Filed Weights   05/11/16 0605 05/12/16 0500 05/13/16 0501  Weight: 100.5 kg (221 lb 9 oz) 97 kg (213 lb 13.5 oz) 103 kg (227 lb 1.2 oz)    Exam:   General:  Anxious, labile mood  Cardiovascular: RRR  Respiratory: improved aeration, some upper airway sounds  Abdomen: Soft/ND/NT, positive BS  Musculoskeletal: trace pitting Edema bilateral lower extremity  Neuro: aaox3  Data Reviewed: Basic Metabolic Panel:  Recent Labs Lab 05/10/16 1734 05/11/16 0515 05/12/16 0715 05/13/16 0136 05/14/16 0140  NA 138 135 136 136 136  K 3.5 4.7 4.1 3.9 4.3  CL 96* 99* 101 101 102  CO2 29 26 27 27 28   GLUCOSE 111* 182* 194* 181* 152*  BUN 18 22* 22* 27* 24*  CREATININE 1.43* 1.68* 1.28* 1.36* 1.35*  CALCIUM 8.8* 8.2* 8.5* 8.5* 8.4*   Liver Function Tests:  Recent Labs Lab 05/10/16 1734  AST 18  ALT 25  ALKPHOS 69  BILITOT 0.4   PROT 5.6*  ALBUMIN 2.8*   No results for input(s): LIPASE, AMYLASE in the last 168 hours. No results for input(s): AMMONIA in the last 168 hours. CBC:  Recent Labs Lab 05/10/16 1734 05/12/16 0715 05/13/16 0136 05/14/16 0140  WBC 18.0* 20.5* 20.0* 14.6*  NEUTROABS 14.3*  --  18.7*  --   HGB 14.5 13.4 13.0 13.2  HCT 44.2 40.6 40.0 40.2  MCV 88.8 87.7 88.1 88.2  PLT 202 173 182 170   Cardiac Enzymes:    Recent Labs Lab 05/10/16 2249  TROPONINI <0.03   BNP (last 3 results)  Recent Labs  05/10/16 2249  BNP 80.5    ProBNP (last 3 results) No results for input(s): PROBNP in the last 8760 hours.  CBG:  Recent Labs Lab 05/11/16 0612 05/12/16 0657 05/13/16 0616 05/14/16 0632  GLUCAP 208* 180* 188* 190*    Recent Results (from the past 240 hour(s))  Blood culture (routine x 2)     Status: None (Preliminary result)   Collection Time: 05/10/16  5:34 PM  Result Value Ref Range Status   Specimen Description BLOOD LEFT ANTECUBITAL  Final   Special Requests BOTTLES DRAWN AEROBIC AND ANAEROBIC 10CC  Final   Culture NO GROWTH 3 DAYS  Final   Report Status PENDING  Incomplete  Blood culture (routine x 2)     Status: None (Preliminary result)   Collection Time: 05/10/16  9:08 PM  Result Value Ref Range Status   Specimen Description BLOOD RIGHT ARM  Final   Special Requests BOTTLES DRAWN AEROBIC AND ANAEROBIC 5CC  Final   Culture NO GROWTH 3 DAYS  Final   Report Status PENDING  Incomplete  Culture, expectorated sputum-assessment     Status: None   Collection Time: 05/11/16  9:49 AM  Result Value Ref Range Status   Specimen Description EXPECTORATED SPUTUM  Final   Special Requests Immunocompromised  Final   Sputum evaluation THIS SPECIMEN IS ACCEPTABLE FOR SPUTUM CULTURE  Final   Report Status 05/12/2016 FINAL  Final  Culture, respiratory (NON-Expectorated)     Status: None   Collection Time: 05/11/16  9:49 AM  Result Value Ref Range Status   Specimen Description  EXPECTORATED SPUTUM  Final   Special Requests Immunocompromised Reflexed from [id number]  Final   Gram Stain   Final    ABUNDANT WBC PRESENT, PREDOMINANTLY PMN MODERATE GRAM NEGATIVE RODS FEW GRAM POSITIVE COCCI IN PAIRS RARE GRAM POSITIVE RODS    Culture Consistent with normal respiratory flora.  Final   Report Status 05/13/2016 FINAL  Final  MRSA PCR Screening     Status: None   Collection Time: 05/11/16  3:40 PM  Result Value Ref Range Status   MRSA by PCR NEGATIVE  NEGATIVE Final    Comment:        The GeneXpert MRSA Assay (FDA approved for NASAL specimens only), is one component of a comprehensive MRSA colonization surveillance program. It is not intended to diagnose MRSA infection nor to guide or monitor treatment for MRSA infections.   Respiratory Panel by PCR     Status: None   Collection Time: 05/11/16  3:41 PM  Result Value Ref Range Status   Adenovirus NOT DETECTED NOT DETECTED Final   Coronavirus 229E NOT DETECTED NOT DETECTED Final   Coronavirus HKU1 NOT DETECTED NOT DETECTED Final   Coronavirus NL63 NOT DETECTED NOT DETECTED Final   Coronavirus OC43 NOT DETECTED NOT DETECTED Final   Metapneumovirus NOT DETECTED NOT DETECTED Final   Rhinovirus / Enterovirus NOT DETECTED NOT DETECTED Final   Influenza A NOT DETECTED NOT DETECTED Final   Influenza B NOT DETECTED NOT DETECTED Final   Parainfluenza Virus 1 NOT DETECTED NOT DETECTED Final   Parainfluenza Virus 2 NOT DETECTED NOT DETECTED Final   Parainfluenza Virus 3 NOT DETECTED NOT DETECTED Final   Parainfluenza Virus 4 NOT DETECTED NOT DETECTED Final   Respiratory Syncytial Virus NOT DETECTED NOT DETECTED Final   Bordetella pertussis NOT DETECTED NOT DETECTED Final   Chlamydophila pneumoniae NOT DETECTED NOT DETECTED Final   Mycoplasma pneumoniae NOT DETECTED NOT DETECTED Final     Studies: Ct Chest Wo Contrast  Result Date: 05/13/2016 CLINICAL DATA:  Follow-up of pneumonia. EXAM: CT CHEST WITHOUT CONTRAST  TECHNIQUE: Multidetector CT imaging of the chest was performed following the standard protocol without IV contrast. COMPARISON:  CT of the chest 01/22/2012, chest radiograph 05/10/2016 FINDINGS: Cardiovascular: Normal heart size. No pericardial effusion. Calcific atherosclerotic disease of the coronary arteries. Milder calcific atherosclerotic disease of the aorta. Normal caliber of the aorta. Mediastinum/Nodes: No enlarged mediastinal or axillary lymph nodes. Thyroid gland, trachea, and esophagus demonstrate no significant findings. Lungs/Pleura: Hyperinflation of the lungs with mild emphysematous changes. Minimal bibasilar peribronchial opacities. Upper Abdomen: No acute abnormality. Musculoskeletal: No chest wall mass or suspicious bone lesions identified. Findings compatible with diffuse idiopathic skeletal hyperostosis of the thoracic spine. IMPRESSION: Hyperinflation of the lungs consistent with COPD. Minimal peribronchial airspace opacities in bilateral lower lobes may represent atelectasis versus peribronchial airspace consolidation. Electronically Signed   By: Ted Mcalpine M.D.   On: 05/13/2016 15:14    Scheduled Meds: . aspirin EC  325 mg Oral Daily  . atenolol  25 mg Oral q morning - 10a  . clopidogrel  75 mg Oral Q breakfast  . enoxaparin (LOVENOX) injection  50 mg Subcutaneous Q24H  . finasteride  5 mg Oral Daily  . guaiFENesin  600 mg Oral BID  . ipratropium-albuterol  3 mL Nebulization TID  . levofloxacin (LEVAQUIN) IV  750 mg Intravenous Q24H  . methylPREDNISolone (SOLU-MEDROL) injection  60 mg Intravenous Q6H  . mometasone-formoterol  2 puff Inhalation BID  . oseltamivir  30 mg Oral BID  . polyethylene glycol  17 g Oral BID  . senna-docusate  2 tablet Oral BID  . sodium chloride flush  3 mL Intravenous Q12H  . tamsulosin  0.4 mg Oral QPC breakfast    Continuous Infusions:   Time spent:  Ayeza Therriault MD, PhD  [facility] Pager [phone]. If 7PM-7AM, please  contact night-coverage at [email or web address], password [facility] 05/14/2016, 8:07 AM  LOS: 4 days

## 2016-05-14 NOTE — Progress Notes (Signed)
Physical Therapy Treatment Patient Details Name: Mitchell [name] MRN: [id number] DOB: December 08, 1940 Today's Date: 05/14/2016    History of Present Illness 76 y.o. male with a history of COPD, CAD/NSTEMI, and HTN presenting with dyspnea and cough. Flu +    PT Comments    Pt progressing towards physical therapy goals. Was able to mobilize around room fairly well with HHA from therapist. Feel he could have completed some tasks with increased independence, however pt demanding of therapist's assistance. Will continue to follow.   Follow Up Recommendations  Home health PT     Equipment Recommendations  None recommended by PT    Recommendations for Other Services OT consult     Precautions / Restrictions Precautions Precautions: Fall Precaution Comments: visual deficits per pt (watch O2 saturations and FLU +) Restrictions Weight Bearing Restrictions: No    Mobility  Bed Mobility Overal bed mobility: Needs Assistance Bed Mobility: Supine to Sit     Supine to sit: Min guard     General bed mobility comments: Pt reaching for therapist's hand to assist him. Encouraged independence and use of rail for support. I assured pt I would help him if he needed it, however pt became upset and grabbed my hand to pull himself up. Feel he could have completed without assist.   Transfers Overall transfer level: Needs assistance Equipment used: 1 person hand held assist Transfers: Sit to/from Stand Sit to Stand: Min guard         General transfer comment: Pt refusing use of RW, and pt demanding therapist's hand for assist. Little to no physical assistance was actually required for safe transition to standing.   Ambulation/Gait Ambulation/Gait assistance: Min guard Ambulation Distance (Feet): 5 Feet Assistive device: 1 person hand held assist Gait Pattern/deviations: Step-through pattern;Decreased stride length;Trunk flexed Gait velocity: decreased Gait velocity interpretation: Below  normal speed for age/gender General Gait Details: Pt only willing to ambulate bed to chair to get washed up. HHA provided for safety at pt's request however no physical assist was required for pt to negotiate 5 feet.    Stairs            Wheelchair Mobility    Modified Rankin (Stroke Patients Only)       Balance Overall balance assessment: Needs assistance   Sitting balance-Leahy Scale: Good       Standing balance-Leahy Scale: Fair                      Cognition Arousal/Alertness: Awake/alert Behavior During Therapy: WFL for tasks assessed/performed Overall Cognitive Status: Within Functional Limits for tasks assessed                      Exercises      General Comments        Pertinent Vitals/Pain Pain Assessment: No/denies pain    Home Living Family/patient expects to be discharged to:: Private residence Living Arrangements: Alone   Type of Home: Apartment Home Access: Stairs to enter   Home Layout: One level Home Equipment: Cane - single point;Grab bars - tub/shower;Grab bars - toilet      Prior Function Level of Independence: Independent with assistive device(s)      Comments: uses cane for ambulation   PT Goals (current goals can now be found in the care plan section) Acute Rehab PT Goals Patient Stated Goal: to get better PT Goal Formulation: With patient Time For Goal Achievement: 05/26/16 Potential to Achieve Goals: Good Progress  towards PT goals: Progressing toward goals    Frequency    Min 3X/week      PT Plan Current plan remains appropriate    Co-evaluation             End of Session Equipment Utilized During Treatment: Oxygen (Pt inisisted O2 off at end of session - RN notified sats 92%) Activity Tolerance: Patient tolerated treatment well Patient left: with call bell/phone within reach;in chair     Time: [id number] PT Time Calculation (min) (ACUTE ONLY): 22 min  Charges:  $Therapeutic Activity:  8-22 mins                    G Codes:      Marylynn PearsonLaura D Yamil Dougher 05/14/2016, 2:53 PM   Conni SlipperLaura Weronika Birch, PT, DPT Acute Rehabilitation Services Pager: [phone]

## 2016-05-14 NOTE — Evaluation (Signed)
Clinical/Bedside Swallow Evaluation Patient Details  Name: Celeste Candelas MRN: [id number] Date of Birth: 25-May-1940  Today's Date: 05/14/2016 Time: SLP Start Time (ACUTE ONLY): 1305 SLP Stop Time (ACUTE ONLY): 1320 SLP Time Calculation (min) (ACUTE ONLY): 15 min  Past Medical History:  Past Medical History:  Diagnosis Date  . Anginal pain (HCC) 01/2012  . Anxiety   . Atherosclerotic heart disease of native coronary artery without angina pectoris   . BPH (benign prostatic hypertrophy)   . CAD (coronary artery disease) 2007   with LAD/Diag CBPTCA and kissing   . COPD (chronic obstructive pulmonary disease) (HCC)    "maybe" (02/14/2012)  . Coronary artery disease   . Depression   . GERD (gastroesophageal reflux disease)   . Hyperlipidemia   . Hypertension   . Hypoxemia   . Myocardial infarction 01/22/2012   NSTEMI with DES to RSA (PDA)  . Shortness of breath    "walking and laying down sometimes" (02/14/2012)   Past Surgical History:  Past Surgical History:  Procedure Laterality Date  . CATARACT EXTRACTION W/ INTRAOCULAR LENS  IMPLANT, BILATERAL  ~ 2010  . CORONARY ANGIOPLASTY WITH STENT PLACEMENT  05/11/2005   PCI OF LAD  . GANGLION CYST REMOVED  1956?   right  . LEFT HEART CATHETERIZATION WITH CORONARY ANGIOGRAM Bilateral 01/24/2012   Procedure: LEFT HEART CATHETERIZATION WITH CORONARY ANGIOGRAM;  Surgeon: Lesleigh Noe, MD;  Location[facility] CATH LAB;  Service: Cardiovascular;  Laterality: Bilateral;  . REFRACTIVE SURGERY  2010   left   HPI:  76 y.o.malewith medical history significant for COPD with chronic hypoxic respiratory failure, hypertension, coronary artery disease status post stent to LAD, and GERD who presents to the emergency department at the direction of his primary care physician for evaluation of fevers, productive cough, and dyspnea.  Dx COPD with acute exacerbation.  During breakfast 1/29, pt reportedly "choking" on POs; hence, swallow evaluation  ordered.    Assessment / Plan / Recommendation Clinical Impression  Pt presents with functional oropharyngeal swallow marked by adequate mastication, seemingly brisk swallow response, no s/s of aspiration; no regurgitation.  Incident this morning does not appear to be typical of swallowing function.  Resume heart healthy diet.  No SLP f/u needed.     Aspiration Risk  No limitations    Diet Recommendation   Heart healthy, thin liquids.  Medication Administration: Whole meds with liquid    Other  Recommendations     Follow up Recommendations        Frequency and Duration            Prognosis        Swallow Study   General Date of Onset: 05/14/16 HPI: 76 y.o.malewith medical history significant for COPD with chronic hypoxic respiratory failure, hypertension, coronary artery disease status post stent to LAD, and GERD who presents to the emergency department at the direction of his primary care physician for evaluation of fevers, productive cough, and dyspnea.  Dx COPD with acute exacerbation.  During breakfast 1/29, pt reportedly "choking" on POs; hence, swallow evaluation ordered.  Type of Study: Bedside Swallow Evaluation Previous Swallow Assessment: no Diet Prior to this Study: NPO Temperature Spikes Noted: No Respiratory Status: Room air History of Recent Intubation: No Behavior/Cognition: Alert Oral Cavity Assessment: Within Functional Limits Oral Care Completed by SLP: No Oral Cavity - Dentition: Dentures, top;Dentures, bottom Vision: Functional for self-feeding Self-Feeding Abilities: Able to feed self Patient Positioning: Upright in chair Baseline Vocal Quality: Normal Volitional Cough:  Strong Volitional Swallow: Able to elicit    Oral/Motor/Sensory Function Overall Oral Motor/Sensory Function: Within functional limits   Ice Chips Ice chips: Within functional limits   Thin Liquid Thin Liquid: Within functional limits Presentation: Cup    Nectar Thick Nectar  Thick Liquid: Not tested   Honey Thick Honey Thick Liquid: Not tested   Puree Puree: Within functional limits   Solid   GO   Solid: Within functional limits        Blenda MountsCouture, Aeron Donaghey Laurice 05/14/2016,1:24 PM

## 2016-05-14 NOTE — Progress Notes (Signed)
RT note: patient stating that he felt as though he was developing sores in his nose from his nasal cannula.  Hooked a humidifier to nasal cannula to help ease.

## 2016-05-14 NOTE — Progress Notes (Signed)
Patient reports "choking" on his food, he is observed taking a sip of his coffee, then coughing and spitting it back out. Patient states "read my lips; I'm gonna choke of this food". All foods and drinks removed from room-MD notified. Will continue to monitor.

## 2016-05-15 LAB — BASIC METABOLIC PANEL
Anion gap: 9 (ref 5–15)
BUN: 28 mg/dL — AB (ref 6–20)
CHLORIDE: 103 mmol/L (ref 101–111)
CO2: 27 mmol/L (ref 22–32)
CREATININE: 1.33 mg/dL — AB (ref 0.61–1.24)
Calcium: 8.3 mg/dL — ABNORMAL LOW (ref 8.9–10.3)
GFR, EST AFRICAN AMERICAN: 58 mL/min — AB (ref >60)
GFR, EST NON AFRICAN AMERICAN: 50 mL/min — AB (ref >60)
Glucose, Bld: 133 mg/dL — ABNORMAL HIGH (ref 65–99)
Potassium: 4.5 mmol/L (ref 3.5–5.1)
SODIUM: 139 mmol/L (ref 135–145)

## 2016-05-15 LAB — CBC
HCT: 41.7 % (ref 39.0–52.0)
Hemoglobin: 13.5 g/dL (ref 13.0–17.0)
MCH: 28.5 pg (ref 26.0–34.0)
MCHC: 32.4 g/dL (ref 30.0–36.0)
MCV: 88.2 fL (ref 78.0–100.0)
PLATELETS: 140 10*3/uL — AB (ref 150–400)
RBC: 4.73 MIL/uL (ref 4.22–5.81)
RDW: 15.6 % — AB (ref 11.5–15.5)
WBC: 11.8 10*3/uL — AB (ref 4.0–10.5)

## 2016-05-15 LAB — CULTURE, BLOOD (ROUTINE X 2)
Culture: NO GROWTH
Culture: NO GROWTH

## 2016-05-15 LAB — GLUCOSE, CAPILLARY: Glucose-Capillary: 187 mg/dL — ABNORMAL HIGH (ref 65–99)

## 2016-05-15 MED ORDER — CIPROFLOXACIN HCL 250 MG PO TABS: 250.0000 mg | ORAL_TABLET | Freq: Two times a day (BID) | ORAL | 0 refills | Status: DC

## 2016-05-15 MED ORDER — FAMOTIDINE 20 MG PO TABS: 20.0000 mg | ORAL_TABLET | Freq: Two times a day (BID) | ORAL | 0 refills | Status: DC

## 2016-05-15 MED ORDER — PREDNISONE 10 MG PO TABS: ORAL_TABLET | ORAL | 0 refills | Status: DC

## 2016-05-15 MED ORDER — GUAIFENESIN ER 600 MG PO TB12: 600.0000 mg | ORAL_TABLET | Freq: Two times a day (BID) | ORAL | 0 refills | Status: DC

## 2016-05-15 MED ORDER — CIPROFLOXACIN HCL 250 MG PO TABS: 250.0000 mg | ORAL_TABLET | Freq: Two times a day (BID) | ORAL | 0 refills | Status: AC

## 2016-05-15 NOTE — Care Management Note (Signed)
Case Management Note  Patient Details  Name: Mitchell [name] MRN: [id number] Date of Birth: 07-17-1940  Subjective/Objective:                    Action/Plan: [facility]. [facility] caregivers. CM notified Dareen Pianodwinna with [facility] of the resumption of care and of his request. Pt d/c last visit with oxygen and currently wearing it in his room. Pt does not have a tank for transport home. CM spoke to [place] [facility] and he will provide the pt a tank to transport home. Pt states he has transportation home.   Expected Discharge Date:  05/15/16               Expected Discharge Plan:  Home w Home Health Services  In-House Referral:     Discharge planning Services  CM Consult  Post Acute Care Choice:  Home Health Choice offered to:  Patient  DME Arranged:    DME Agency:     HH Arranged:  RN, PT, Social Work HH Agency[facility]  [facility]  Status of Service:  Completed, signed off  If discussed at [employer] Length of Stay Meetings, dates discussed:    Additional Comments:  Kermit BaloKelli F Edyth Glomb, RN 05/15/2016, 11:15 AM

## 2016-05-15 NOTE — Discharge Summary (Signed)
Discharge Summary  Mitchell [name] ZOX:[id number] DOB: [birth year]  PCP: Duane Lope, MD  Admit date: 05/10/2016 Discharge date: 05/15/2016  Time spent: >67mins  Recommendations for Outpatient Follow-up:  1. F/u with PMD within a week  for hospital discharge follow up, repeat cbc/bmp at follow up 2. F/u with pulmonology  Discharge Diagnoses:  Active Hospital Problems   Diagnosis Date Noted  . COPD with acute exacerbation (HCC) 01/31/2015  . Influenza 05/10/2016  . CKD (chronic kidney disease), stage III 05/10/2016  . Chronic respiratory failure (HCC) 03/15/2015  . History of depression   . CAD (coronary artery disease) 01/23/2012  . Essential hypertension 04/29/2011    Resolved Hospital Problems   Diagnosis Date Noted Date Resolved  No resolved problems to display.    Discharge Condition: stable  Diet recommendation: heart healthy/carb modified  Filed Weights   05/11/16 0605 05/12/16 0500 05/13/16 0501  Weight: 100.5 kg (221 lb 9 oz) 97 kg (213 lb 13.5 oz) 103 kg (227 lb 1.2 oz)    History of present illness:  PCP: Duane Lope, MD   Patient coming from: Home, by way of PCP office  Chief Complaint: Fevers, malaise, cough, SOB   HPI: Mitchell [name] is a 76 y.o. male with medical history significant for COPD with chronic hypoxic respiratory failure, hypertension, coronary artery disease status post stent to LAD, and GERD who presents to the emergency department at the direction of his primary care physician for evaluation of fevers, productive cough, and dyspnea. Patient had recently been admitted to the hospital and treated for exacerbation and COPD with influenza and acute kidney injury. He was discharged home in much improved and stable condition on 05/03/2016 to complete a course of Tamiflu and prednisone. Unfortunately, patient quickly began to re-worsened back at home with persistent fevers and return of his cough and dyspnea. His cough is now productive of thick  yellow and white sputum and the patient reports dyspnea with minimal exertion. He reports continued fevers and chills with malaise and generalized aches. He denies any significant abdominal pain and denies vomiting or diarrhea. Patient also denies dysuria or flank pain. There has been no chest pain or palpitations associated with this illness. Patient was febrile at his PCP office and blood pressure was soft. He was directed to the ED for further evaluation.  ED Course: Upon arrival to the ED, patient is found to be afebrile, saturating adequately on his usual 2 L per minute of supplemental oxygen, and with vitals otherwise stable. Chest x-ray is negative for acute cardiopulmonary disease and chemistry panel is notable for serum creatinine 1.43, up from an apparent baseline of 1.2. CBC is notable for a leukocytosis to 18,000 and lactic acid is elevated to a value of 2.12. Blood cultures were obtained and 1 L of normal saline was given in the emergency department. Patient was treated with Tylenol, nebs, Tamiflu, Advil, and Zofran in the ED. He reported some mild subjective improvement with these measures, but continued to be significantly dyspneic while at rest and will be observed on the medical surgical unit for ongoing evaluation and management of recurrent exacerbation and COPD.   Hospital Course:  Principal Problem:   COPD with acute exacerbation (HCC) Active Problems:   Essential hypertension   CAD (coronary artery disease)   History of depression   Chronic respiratory failure (HCC)   Influenza   CKD (chronic kidney disease), stage III   COPD exacerbation  recurrent, after just tapered off steroids cxr no acute  findings Sputum culture with oral flora, respiratory virus panel negative, urine legionella antigen negative,  urine strep pneumo antigen negative, mrsa screening negative On levaquin, nebs, mucinex, prn o2 CT chest  On 1/28"Hyperinflation of the lungs consistent with COPD.  Minimal peribronchial airspace opacities in bilateral lower lobes may represent atelectasis versus peribronchial airspace consolidation." He is improving clinically, taper steroids, d/c home on 1/30 with cipro to finish abx treatment and taper steroids.   Hypoxic respiratory failure, he was sent home on home o2 from recent hospitalization a week ago. Ambulating on room air, o2 sat at 92% per PT.  Lactic acidosis: on admission, resolved.  Leukocytosis: partly due to copd exacerbation, partly due to steroids Wbc 20 on admission, WBC 11.8 at discharge  Impaired fasting blood sugar, likely from steroids,  a1c 6.0.  AKI on CKD stage III ,ua unremarkable - cr peaked at 1.68, cr 1.28 on 1/27,   -he was given 1liter ns in the ED,  Lasix and metolazone held since admission;   - renal dosing meds, daily wts and I/O's - cr improving, restart lasix on 1/29 and metolozone resumed at discharge -No edema at discharge, cr stable at baseline, pmd to continue monitor edema and renal function.  Chronic bilateral lower extremity edema, though echocardiogram did not show chf He report pmd started him on lasix and metolozone which greatly helped his edema, both diuretics held since admission,  home meds lasix restarted on on 1/29, metolozone resumed at discharge No edema at discharge, cr stable at baseline, pmd to continue monitor edema and renal function.  CAD Pt has hx of stent to LAD, follows with cardiology - No anginal complaints  - Troponin is undetectable  -  Continue daily ASA 325 and Plavix, beta-blocker    Hypertension - BP at goal on admission  - Continue atenolol as tolerated   BPH :  Continue home meds flomax/proscar  Obesity: Body mass index is 30.8 kg/m.   FTT: PT/home health  DVT prophylaxis:sq Lovenox  Code Status:Full  Family Communication:patient  Disposition Plan:home with home health on 1/30, will need to follow up with pulmonologist, patient  aware Consults called:None  Procedures:  none  Antibiotics:  levaquin   Discharge Exam: BP 124/76 (BP Location: Left Arm)   Pulse 63   Temp 98.1 F (36.7 C) (Oral)   Resp 18   Ht 6' (1.829 m)   Wt 103 kg (227 lb 1.2 oz)   SpO2 94%   BMI 30.80 kg/m     General:  Anxious, labile mood  Cardiovascular: RRR  Respiratory: improved aeration, some upper airway sounds  Abdomen: Soft/ND/NT, positive BS  Musculoskeletal: trace pitting Edema bilateral lower extremity has resolved, skin started to wrinkle   Neuro: aaox3   Discharge Instructions You were cared for by a hospitalist during your hospital stay. If you have any questions about your discharge medications or the care you received while you were in the hospital after you are discharged, you can call the unit and asked to speak with the hospitalist on call if the hospitalist that took care of you is not available. Once you are discharged, your primary care physician will handle any further medical issues. Please note that NO REFILLS for any discharge medications will be authorized once you are discharged, as it is imperative that you return to your primary care physician (or establish a relationship with a primary care physician if you do not have one) for your aftercare needs so that they can reassess  your need for medications and monitor your lab values.  Discharge Instructions    Diet - low sodium heart healthy    Complete by:  As directed    Face-to-face encounter (required for Medicare/Medicaid patients)    Complete by:  As directed    I Kallin Henk certify that this patient is under my care and that I, or a nurse practitioner or physician's assistant working with me, had a face-to-face encounter that meets the physician face-to-face encounter requirements with this patient on 05/14/2016. The encounter with the patient was in whole, or in part for the following medical condition(s) which is the primary reason for home  health care (List medical condition): FTT   The encounter with the patient was in whole, or in part, for the following medical condition, which is the primary reason for home health care:  FTT   I certify that, based on my findings, the following services are medically necessary home health services:   Nursing Physical therapy     Reason for Medically Necessary Home Health Services:  Skilled Nursing- Change/Decline in Patient Status   My clinical findings support the need for the above services:  Shortness of breath with activity   Further, I certify that my clinical findings support that this patient is homebound due to:  Shortness of Breath with activity   Home Health    Complete by:  As directed    To provide the following care/treatments:   PT RN Social work     Increase activity slowly    Complete by:  As directed      Allergies as of 05/15/2016      Reactions   Lipitor [atorvastatin] Other (See Comments)   "just paralyzed me; I can't take it"   Statins Other (See Comments)   "paralyzed me; I can't take it"      Medication List    STOP taking these medications   nabumetone 500 MG tablet Commonly known as:  RELAFEN   naproxen 375 MG tablet Commonly known as:  NAPROSYN   oseltamivir 30 MG capsule Commonly known as:  TAMIFLU     TAKE these medications   albuterol 108 (90 Base) MCG/ACT inhaler Commonly known as:  PROVENTIL HFA;VENTOLIN HFA Inhale 2 puffs into the lungs every 6 (six) hours as needed for wheezing. Reported on 05/04/2015   aspirin EC 325 MG tablet Take 325 mg by mouth daily. What changed:  Another medication with the same name was removed. Continue taking this medication, and follow the directions you see here.   atenolol 25 MG tablet Commonly known as:  TENORMIN TAKE 1 TABLET (25 MG TOTAL) BY MOUTH EVERY MORNING.   ciprofloxacin 250 MG tablet Commonly known as:  CIPRO Take 1 tablet (250 mg total) by mouth 2 (two) times daily.   clopidogrel 75 MG  tablet Commonly known as:  PLAVIX TAKE 1 TABLET (75 MG TOTAL) BY MOUTH DAILY WITH BREAKFAST.   COLCRYS 0.6 MG tablet Generic drug:  colchicine Take 0.6 mg by mouth daily as needed for breakthrough pain.   cyclobenzaprine 10 MG tablet Commonly known as:  FLEXERIL Take 1 tablet (10 mg total) by mouth 2 (two) times daily as needed for muscle spasms.   famotidine 20 MG tablet Commonly known as:  PEPCID Take 1 tablet (20 mg total) by mouth 2 (two) times daily.   finasteride 5 MG tablet Commonly known as:  PROSCAR Take 5 mg by mouth daily. Reported on 05/04/2015   furosemide 40 MG  tablet Commonly known as:  LASIX Take 40 mg by mouth 2 (two) times daily.   guaiFENesin 600 MG 12 hr tablet Commonly known as:  MUCINEX Take 1 tablet (600 mg total) by mouth 2 (two) times daily.   HYDROcodone-acetaminophen 5-325 MG tablet Commonly known as:  NORCO/VICODIN Take 2 tablets by mouth every 6 (six) hours as needed for severe pain.   metolazone 2.5 MG tablet Commonly known as:  ZAROXOLYN Take 2.5 mg by mouth 3 (three) times a week. Before taking Tuesday, Thursday, Saturday   mometasone-formoterol 100-5 MCG/ACT Aero Commonly known as:  DULERA Inhale 2 puffs into the lungs 2 (two) times daily.   nitroGLYCERIN 0.4 MG SL tablet Commonly known as:  NITROSTAT Place 0.4 mg under the tongue every 5 (five) minutes as needed. Reported on 05/04/2015   polyethylene glycol packet Commonly known as:  MIRALAX / GLYCOLAX Take 17 g by mouth 2 (two) times daily.   predniSONE 10 MG tablet Commonly known as:  DELTASONE Takes 6 tablets for 1 days, then 5 tablets for 1 days, then 4 tablets for 1 days, then 3 tablets for 1 days, then 2 tabs for 1 days, then 1 tab for 1 days, and then stop.   sodium chloride 0.65 % Soln nasal spray Commonly known as:  OCEAN Place 1 spray into both nostrils as needed for congestion. Reported on 05/04/2015   tamsulosin 0.4 MG Caps capsule Commonly known as:  FLOMAX Take 0.4  mg by mouth daily after breakfast. Reported on 05/04/2015   VIRTUSSIN A/C 100-10 MG/5ML syrup Generic drug:  guaiFENesin-codeine Take 5 mLs by mouth 4 (four) times daily as needed for cough.      Allergies  Allergen Reactions  . Lipitor [Atorvastatin] Other (See Comments)    "just paralyzed me; I can't take it"  . Statins Other (See Comments)    "paralyzed me; I can't take it"   Follow-up Information    ALVA,RAKESH V., MD Follow up in 3 week(s).   Specialty:  Pulmonary Disease Why:  for copd Contact information: [address] [place] [address] [phone]        Duane Lope, MD Follow up in 1 week(s).   Specialty:  Family Medicine Why:  hospital discharge follow up, repeat cbc/bmp at follow up. Contact information: [address] [place] [address] [phone]            The results of significant diagnostics from this hospitalization (including imaging, microbiology, ancillary and laboratory) are listed below for reference.    Significant Diagnostic Studies: Dg Chest 2 View  Result Date: 05/01/2016 CLINICAL DATA:  Shortness of breath, fever, cold symptoms EXAM: CHEST  2 VIEW COMPARISON:  02/01/2015 FINDINGS: Cardiomediastinal silhouette is stable. No infiltrate or pleural effusion. No pulmonary edema. Subtle mild perihilar bronchitic changes. Degenerative changes thoracic spine. IMPRESSION: No infiltrate or pulmonary edema. Mild perihilar bronchitic changes. Degenerative changes thoracic spine. Electronically Signed   By: Natasha Mead M.D.   On: 05/01/2016 12:43   Ct Chest Wo Contrast  Result Date: 05/13/2016 CLINICAL DATA:  Follow-up of pneumonia. EXAM: CT CHEST WITHOUT CONTRAST TECHNIQUE: Multidetector CT imaging of the chest was performed following the standard protocol without IV contrast. COMPARISON:  CT of the chest 01/22/2012, chest radiograph 05/10/2016 FINDINGS: Cardiovascular: Normal heart size. No pericardial effusion. Calcific atherosclerotic  disease of the coronary arteries. Milder calcific atherosclerotic disease of the aorta. Normal caliber of the aorta. Mediastinum/Nodes: No enlarged mediastinal or axillary lymph nodes. Thyroid gland, trachea,  and esophagus demonstrate no significant findings. Lungs/Pleura: Hyperinflation of the lungs with mild emphysematous changes. Minimal bibasilar peribronchial opacities. Upper Abdomen: No acute abnormality. Musculoskeletal: No chest wall mass or suspicious bone lesions identified. Findings compatible with diffuse idiopathic skeletal hyperostosis of the thoracic spine. IMPRESSION: Hyperinflation of the lungs consistent with COPD. Minimal peribronchial airspace opacities in bilateral lower lobes may represent atelectasis versus peribronchial airspace consolidation. Electronically Signed   By: Ted Mcalpine M.D.   On: 05/13/2016 15:14   Dg Chest Portable 1 View  Result Date: 05/10/2016 CLINICAL DATA:  Possible sepsis. EXAM: PORTABLE CHEST 1 VIEW COMPARISON:  05/10/2016 FINDINGS: 2112 hours. The lungs are clear wiithout focal pneumonia, edema, pneumothorax or pleural effusion. The cardiopericardial silhouette is within normal limits for size. The visualized bony structures of the thorax are intact. Telemetry leads overlie the chest. IMPRESSION: No active disease. Electronically Signed   By: Kennith Center M.D.   On: 05/10/2016 21:30   Dg Toe Great Right  Result Date: 05/01/2016 CLINICAL DATA:  Pt c/o diffuse RIGHT great toe pain, swelling, denies inj, admitted for + flu today, reports hx known gout, arthritis to extremities. EXAM: RIGHT GREAT TOE COMPARISON:  None. FINDINGS: Osseous alignment is normal. Bone mineralization is normal. No fracture line or displaced fracture fragment identified. No acute or suspicious osseous lesion. No erosions or other confirming signs of an inflammatory arthritis. There is, however, a subtle density adjacent to the distal margin of the right first metatarsal bone which  could represent early/mild tophus formation. IMPRESSION: 1. Subtle small density within the soft tissues immediately adjacent to the distal margin of the right first metatarsal bone which may represent early/mild tophus formation related to patient's known gout. There are, however, no erosions or other confirming signs of an inflammatory arthritis. 2. No acute findings.  No fracture or dislocation. Electronically Signed   By: Bary Richard M.D.   On: 05/01/2016 20:56    Microbiology: Recent Results (from the past 240 hour(s))  Blood culture (routine x 2)     Status: None (Preliminary result)   Collection Time: 05/10/16  5:34 PM  Result Value Ref Range Status   Specimen Description BLOOD LEFT ANTECUBITAL  Final   Special Requests BOTTLES DRAWN AEROBIC AND ANAEROBIC 10CC  Final   Culture NO GROWTH 4 DAYS  Final   Report Status PENDING  Incomplete  Blood culture (routine x 2)     Status: None (Preliminary result)   Collection Time: 05/10/16  9:08 PM  Result Value Ref Range Status   Specimen Description BLOOD RIGHT ARM  Final   Special Requests BOTTLES DRAWN AEROBIC AND ANAEROBIC 5CC  Final   Culture NO GROWTH 4 DAYS  Final   Report Status PENDING  Incomplete  Culture, expectorated sputum-assessment     Status: None   Collection Time: 05/11/16  9:49 AM  Result Value Ref Range Status   Specimen Description EXPECTORATED SPUTUM  Final   Special Requests Immunocompromised  Final   Sputum evaluation THIS SPECIMEN IS ACCEPTABLE FOR SPUTUM CULTURE  Final   Report Status 05/12/2016 FINAL  Final  Culture, respiratory (NON-Expectorated)     Status: None   Collection Time: 05/11/16  9:49 AM  Result Value Ref Range Status   Specimen Description EXPECTORATED SPUTUM  Final   Special Requests Immunocompromised Reflexed from [id number]  Final   Gram Stain   Final    ABUNDANT WBC PRESENT, PREDOMINANTLY PMN MODERATE GRAM NEGATIVE RODS FEW GRAM POSITIVE COCCI IN PAIRS RARE GRAM POSITIVE  RODS    Culture  Consistent with normal respiratory flora.  Final   Report Status 05/13/2016 FINAL  Final  MRSA PCR Screening     Status: None   Collection Time: 05/11/16  3:40 PM  Result Value Ref Range Status   MRSA by PCR NEGATIVE NEGATIVE Final    Comment:        The GeneXpert MRSA Assay (FDA approved for NASAL specimens only), is one component of a comprehensive MRSA colonization surveillance program. It is not intended to diagnose MRSA infection nor to guide or monitor treatment for MRSA infections.   Respiratory Panel by PCR     Status: None   Collection Time: 05/11/16  3:41 PM  Result Value Ref Range Status   Adenovirus NOT DETECTED NOT DETECTED Final   Coronavirus 229E NOT DETECTED NOT DETECTED Final   Coronavirus HKU1 NOT DETECTED NOT DETECTED Final   Coronavirus NL63 NOT DETECTED NOT DETECTED Final   Coronavirus OC43 NOT DETECTED NOT DETECTED Final   Metapneumovirus NOT DETECTED NOT DETECTED Final   Rhinovirus / Enterovirus NOT DETECTED NOT DETECTED Final   Influenza A NOT DETECTED NOT DETECTED Final   Influenza B NOT DETECTED NOT DETECTED Final   Parainfluenza Virus 1 NOT DETECTED NOT DETECTED Final   Parainfluenza Virus 2 NOT DETECTED NOT DETECTED Final   Parainfluenza Virus 3 NOT DETECTED NOT DETECTED Final   Parainfluenza Virus 4 NOT DETECTED NOT DETECTED Final   Respiratory Syncytial Virus NOT DETECTED NOT DETECTED Final   Bordetella pertussis NOT DETECTED NOT DETECTED Final   Chlamydophila pneumoniae NOT DETECTED NOT DETECTED Final   Mycoplasma pneumoniae NOT DETECTED NOT DETECTED Final  Urine culture     Status: None   Collection Time: 05/13/16  3:50 PM  Result Value Ref Range Status   Specimen Description URINE, CLEAN CATCH  Final   Special Requests NONE  Final   Culture NO GROWTH  Final   Report Status 05/14/2016 FINAL  Final     Labs: Basic Metabolic Panel:  Recent Labs Lab 05/11/16 0515 05/12/16 0715 05/13/16 0136 05/14/16 0140 05/15/16 0226  NA 135 136  136 136 139  K 4.7 4.1 3.9 4.3 4.5  CL 99* 101 101 102 103  CO2 26 27 27 28 27   GLUCOSE 182* 194* 181* 152* 133*  BUN 22* 22* 27* 24* 28*  CREATININE 1.68* 1.28* 1.36* 1.35* 1.33*  CALCIUM 8.2* 8.5* 8.5* 8.4* 8.3*   Liver Function Tests:  Recent Labs Lab 05/10/16 1734  AST 18  ALT 25  ALKPHOS 69  BILITOT 0.4  PROT 5.6*  ALBUMIN 2.8*   No results for input(s): LIPASE, AMYLASE in the last 168 hours. No results for input(s): AMMONIA in the last 168 hours. CBC:  Recent Labs Lab 05/10/16 1734 05/12/16 0715 05/13/16 0136 05/14/16 0140 05/15/16 0226  WBC 18.0* 20.5* 20.0* 14.6* 11.8*  NEUTROABS 14.3*  --  18.7*  --   --   HGB 14.5 13.4 13.0 13.2 13.5  HCT 44.2 40.6 40.0 40.2 41.7  MCV 88.8 87.7 88.1 88.2 88.2  PLT 202 173 182 170 140*   Cardiac Enzymes:  Recent Labs Lab 05/10/16 2249  TROPONINI <0.03   BNP: BNP (last 3 results)  Recent Labs  05/10/16 2249  BNP 80.5    ProBNP (last 3 results) No results for input(s): PROBNP in the last 8760 hours.  CBG:  Recent Labs Lab 05/11/16 0612 05/12/16 0657 05/13/16 0616 05/14/16 0632 05/15/16 0630  GLUCAP 208* 180* 188* 190*  187*       SignedAlbertine Grates MD, PhD  [facility] 05/15/2016, 11:22 AM

## 2016-08-01 ENCOUNTER — Emergency Department (HOSPITAL_COMMUNITY): Payer: Medicare PPO

## 2016-08-01 ENCOUNTER — Other Ambulatory Visit: Payer: Self-pay | Admitting: Physician Assistant

## 2016-08-01 ENCOUNTER — Emergency Department (HOSPITAL_COMMUNITY)
Admission: EM | Admit: 2016-08-01 | Discharge: 2016-08-01 | Disposition: A | Discharge status: Home / Self Care | Payer: Medicare PPO | Attending: Emergency Medicine | Admitting: Emergency Medicine

## 2016-08-01 ENCOUNTER — Encounter (HOSPITAL_COMMUNITY): Payer: Self-pay | Admitting: Neurology

## 2016-08-01 DIAGNOSIS — Z87891 Personal history of nicotine dependence: Secondary | ICD-10-CM | POA: Insufficient documentation

## 2016-08-01 DIAGNOSIS — Y9389 Activity, other specified: Secondary | ICD-10-CM | POA: Insufficient documentation

## 2016-08-01 DIAGNOSIS — M25561 Pain in right knee: Secondary | ICD-10-CM

## 2016-08-01 DIAGNOSIS — E876 Hypokalemia: Secondary | ICD-10-CM | POA: Insufficient documentation

## 2016-08-01 DIAGNOSIS — Y999 Unspecified external cause status: Secondary | ICD-10-CM | POA: Insufficient documentation

## 2016-08-01 DIAGNOSIS — I129 Hypertensive chronic kidney disease with stage 1 through stage 4 chronic kidney disease, or unspecified chronic kidney disease: Secondary | ICD-10-CM | POA: Insufficient documentation

## 2016-08-01 DIAGNOSIS — J449 Chronic obstructive pulmonary disease, unspecified: Secondary | ICD-10-CM | POA: Diagnosis not present

## 2016-08-01 DIAGNOSIS — Z955 Presence of coronary angioplasty implant and graft: Secondary | ICD-10-CM | POA: Insufficient documentation

## 2016-08-01 DIAGNOSIS — M542 Cervicalgia: Secondary | ICD-10-CM | POA: Insufficient documentation

## 2016-08-01 DIAGNOSIS — S199XXA Unspecified injury of neck, initial encounter: Secondary | ICD-10-CM | POA: Diagnosis present

## 2016-08-01 DIAGNOSIS — Y92410 Unspecified street and highway as the place of occurrence of the external cause: Secondary | ICD-10-CM | POA: Diagnosis not present

## 2016-08-01 DIAGNOSIS — Z79899 Other long term (current) drug therapy: Secondary | ICD-10-CM | POA: Diagnosis not present

## 2016-08-01 DIAGNOSIS — I252 Old myocardial infarction: Secondary | ICD-10-CM | POA: Diagnosis not present

## 2016-08-01 DIAGNOSIS — Z7982 Long term (current) use of aspirin: Secondary | ICD-10-CM | POA: Insufficient documentation

## 2016-08-01 DIAGNOSIS — I2510 Atherosclerotic heart disease of native coronary artery without angina pectoris: Secondary | ICD-10-CM | POA: Insufficient documentation

## 2016-08-01 DIAGNOSIS — N183 Chronic kidney disease, stage 3 (moderate): Secondary | ICD-10-CM | POA: Diagnosis not present

## 2016-08-01 DIAGNOSIS — R51 Headache: Secondary | ICD-10-CM | POA: Diagnosis not present

## 2016-08-01 LAB — CBC WITH DIFFERENTIAL/PLATELET
BASOS PCT: 1 %
Basophils Absolute: 0.1 10*3/uL (ref 0.0–0.1)
EOS ABS: 0.2 10*3/uL (ref 0.0–0.7)
Eosinophils Relative: 2 %
HEMATOCRIT: 46.5 % (ref 39.0–52.0)
HEMOGLOBIN: 16 g/dL (ref 13.0–17.0)
Lymphocytes Relative: 20 %
Lymphs Abs: 2 10*3/uL (ref 0.7–4.0)
MCH: 29.1 pg (ref 26.0–34.0)
MCHC: 34.4 g/dL (ref 30.0–36.0)
MCV: 84.7 fL (ref 78.0–100.0)
MONO ABS: 1.4 10*3/uL — AB (ref 0.1–1.0)
MONOS PCT: 14 %
Neutro Abs: 6 10*3/uL (ref 1.7–7.7)
Neutrophils Relative %: 63 %
Platelets: 205 10*3/uL (ref 150–400)
RBC: 5.49 MIL/uL (ref 4.22–5.81)
RDW: 14.4 % (ref 11.5–15.5)
WBC: 9.7 10*3/uL (ref 4.0–10.5)

## 2016-08-01 LAB — BASIC METABOLIC PANEL
Anion gap: 14 (ref 5–15)
BUN: 36 mg/dL — AB (ref 6–20)
CALCIUM: 9.4 mg/dL (ref 8.9–10.3)
CHLORIDE: 93 mmol/L — AB (ref 101–111)
CO2: 30 mmol/L (ref 22–32)
CREATININE: 1.45 mg/dL — AB (ref 0.61–1.24)
GFR calc non Af Amer: 45 mL/min — ABNORMAL LOW (ref >60)
GFR, EST AFRICAN AMERICAN: 52 mL/min — AB (ref >60)
Glucose, Bld: 96 mg/dL (ref 65–99)
Potassium: 2.6 mmol/L — CL (ref 3.5–5.1)
SODIUM: 137 mmol/L (ref 135–145)

## 2016-08-01 LAB — MAGNESIUM: Magnesium: 2.2 mg/dL (ref 1.7–2.4)

## 2016-08-01 MED ORDER — POTASSIUM CHLORIDE CRYS ER 20 MEQ PO TBCR
40.0000 meq | EXTENDED_RELEASE_TABLET | Freq: Once | ORAL | Status: AC
  Administered 2016-08-01: 40 meq via ORAL
  Filled 2016-08-01: qty 2

## 2016-08-01 MED ORDER — ACETAMINOPHEN 500 MG PO TABS
500.0000 mg | ORAL_TABLET | Freq: Once | ORAL | Status: AC
  Administered 2016-08-01: 500 mg via ORAL
  Filled 2016-08-01: qty 1

## 2016-08-01 MED ORDER — KCL IN DEXTROSE-NACL 40-5-0.9 MEQ/L-%-% IV SOLN
INTRAVENOUS | Status: DC
  Administered 2016-08-01: 13:00:00 via INTRAVENOUS
  Filled 2016-08-01: qty 1000

## 2016-08-01 MED ORDER — METHOCARBAMOL 500 MG PO TABS: 500.0000 mg | ORAL_TABLET | Freq: Two times a day (BID) | ORAL | 0 refills | Status: DC

## 2016-08-01 NOTE — Discharge Instructions (Signed)
Take Tylenol and Robaxin for pain. Follow-up with PCP for further evaluation if needed. Return to ED for worsening pain, vision changes, inability to walk, additional injury, vomiting, numbness or weakness.  Please call Medical Records at [phone] for medical records to be obtained in order to show PCP.
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
## 2016-08-01 NOTE — ED Notes (Signed)
Patient transported to X-ray 

## 2016-08-01 NOTE — ED Notes (Signed)
Got patient on the monitor waiting for provider patient is resting
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
## 2016-08-01 NOTE — ED Notes (Signed)
Patient transported to CT 

## 2016-09-28 ENCOUNTER — Telehealth: Payer: Self-pay | Admitting: Interventional Cardiology

## 2016-09-28 NOTE — Telephone Encounter (Signed)
Will route to Dr. Katrinka BlazingSmith for review and advisement on cardiac clearance and holding ASA and Plavix prior to procedure.

## 2016-09-28 NOTE — Telephone Encounter (Signed)
It is reasonable/permissible to hold Plavix for 5-7 days prior to arthroscopic surgery.

## 2016-09-28 NOTE — Telephone Encounter (Signed)
New message      Request for surgical clearance:  What type of surgery is being performed?  Rt knee scope 1. When is this surgery scheduled? Pending clearance  Are there any medications that need to be held prior to surgery and how long? Need instructions from us regarding holding meds before and after procedure and cardiac clearance Name of physician performing surgery?  Dr Luiz BlareGraves What is your office phone and fax number?  Fax [phone]

## 2016-10-01 NOTE — Telephone Encounter (Signed)
Sent to requesting office 

## 2016-12-02 DIAGNOSIS — I10 Essential (primary) hypertension: Secondary | ICD-10-CM | POA: Diagnosis not present

## 2016-12-02 DIAGNOSIS — I509 Heart failure, unspecified: Secondary | ICD-10-CM | POA: Diagnosis not present

## 2016-12-02 DIAGNOSIS — J449 Chronic obstructive pulmonary disease, unspecified: Secondary | ICD-10-CM | POA: Diagnosis not present
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
## 2016-12-03 ENCOUNTER — Encounter (INDEPENDENT_AMBULATORY_CARE_PROVIDER_SITE_OTHER): Payer: Self-pay

## 2016-12-03 ENCOUNTER — Ambulatory Visit (INDEPENDENT_AMBULATORY_CARE_PROVIDER_SITE_OTHER): Payer: Medicare PPO | Admitting: Interventional Cardiology

## 2016-12-03 ENCOUNTER — Encounter: Payer: Self-pay | Admitting: Interventional Cardiology

## 2016-12-03 VITALS — BP 120/72 | HR 50 | Ht 72.0 in | Wt 215.0 lb

## 2016-12-03 DIAGNOSIS — I10 Essential (primary) hypertension: Secondary | ICD-10-CM

## 2016-12-03 DIAGNOSIS — I2510 Atherosclerotic heart disease of native coronary artery without angina pectoris: Secondary | ICD-10-CM | POA: Diagnosis not present

## 2016-12-03 DIAGNOSIS — J449 Chronic obstructive pulmonary disease, unspecified: Secondary | ICD-10-CM | POA: Diagnosis not present

## 2016-12-03 DIAGNOSIS — N183 Chronic kidney disease, stage 3 unspecified: Secondary | ICD-10-CM

## 2016-12-03 MED ORDER — ASPIRIN EC 81 MG PO TBEC: 81.0000 mg | DELAYED_RELEASE_TABLET | Freq: Every day | ORAL | 3 refills | Status: DC

## 2016-12-03 NOTE — Patient Instructions (Signed)
Medication Instructions:  1) DECREASE Aspirin to 81mg once daily  Labwork: None  Testing/Procedures: Your physician has requested that you have a lexiscan myoview. For further information please visit [email or web address]. Please follow instruction sheet, as given.   Follow-Up: Your physician wants you to follow-up in: 1 year with Dr. Smith.  You will receive a reminder letter in the mail two months in advance. If you don't receive a letter, please call our office to schedule the follow-up appointment.   Any Other Special Instructions Will Be Listed Below (If Applicable).     If you need a refill on your cardiac medications before your next appointment, please call your pharmacy.   

## 2016-12-21 ENCOUNTER — Other Ambulatory Visit: Payer: Self-pay | Admitting: Physician Assistant

## 2017-01-02 DIAGNOSIS — I10 Essential (primary) hypertension: Secondary | ICD-10-CM | POA: Diagnosis not present

## 2017-01-02 DIAGNOSIS — J449 Chronic obstructive pulmonary disease, unspecified: Secondary | ICD-10-CM | POA: Diagnosis not present

## 2017-01-02 DIAGNOSIS — I509 Heart failure, unspecified: Secondary | ICD-10-CM | POA: Diagnosis not present

## 2017-01-04 ENCOUNTER — Emergency Department (HOSPITAL_COMMUNITY): Payer: Medicare PPO

## 2017-01-04 ENCOUNTER — Inpatient Hospital Stay (HOSPITAL_COMMUNITY)
Admission: EM | Admit: 2017-01-04 | Discharge: 2017-01-12 | DRG: 682 | Disposition: A | Discharge status: Home Health | Payer: Medicare PPO | Attending: Internal Medicine | Admitting: Internal Medicine

## 2017-01-04 ENCOUNTER — Observation Stay (HOSPITAL_COMMUNITY): Payer: Medicare PPO

## 2017-01-04 ENCOUNTER — Encounter (HOSPITAL_COMMUNITY): Payer: Self-pay

## 2017-01-04 ENCOUNTER — Other Ambulatory Visit (HOSPITAL_COMMUNITY): Payer: Medicare PPO

## 2017-01-04 DIAGNOSIS — M1711 Unilateral primary osteoarthritis, right knee: Secondary | ICD-10-CM | POA: Diagnosis not present

## 2017-01-04 DIAGNOSIS — N400 Enlarged prostate without lower urinary tract symptoms: Secondary | ICD-10-CM | POA: Diagnosis present

## 2017-01-04 DIAGNOSIS — M79604 Pain in right leg: Secondary | ICD-10-CM | POA: Diagnosis not present

## 2017-01-04 DIAGNOSIS — N179 Acute kidney failure, unspecified: Secondary | ICD-10-CM | POA: Diagnosis present

## 2017-01-04 DIAGNOSIS — I959 Hypotension, unspecified: Secondary | ICD-10-CM | POA: Diagnosis present

## 2017-01-04 DIAGNOSIS — I6789 Other cerebrovascular disease: Secondary | ICD-10-CM | POA: Diagnosis not present

## 2017-01-04 DIAGNOSIS — N1330 Unspecified hydronephrosis: Secondary | ICD-10-CM

## 2017-01-04 DIAGNOSIS — E7800 Pure hypercholesterolemia, unspecified: Secondary | ICD-10-CM | POA: Diagnosis not present

## 2017-01-04 DIAGNOSIS — I509 Heart failure, unspecified: Secondary | ICD-10-CM | POA: Diagnosis not present

## 2017-01-04 DIAGNOSIS — Z8249 Family history of ischemic heart disease and other diseases of the circulatory system: Secondary | ICD-10-CM

## 2017-01-04 DIAGNOSIS — E785 Hyperlipidemia, unspecified: Secondary | ICD-10-CM | POA: Diagnosis present

## 2017-01-04 DIAGNOSIS — F1010 Alcohol abuse, uncomplicated: Secondary | ICD-10-CM | POA: Diagnosis present

## 2017-01-04 DIAGNOSIS — M25519 Pain in unspecified shoulder: Secondary | ICD-10-CM

## 2017-01-04 DIAGNOSIS — K5900 Constipation, unspecified: Secondary | ICD-10-CM | POA: Diagnosis not present

## 2017-01-04 DIAGNOSIS — I484 Atypical atrial flutter: Secondary | ICD-10-CM | POA: Diagnosis not present

## 2017-01-04 DIAGNOSIS — F329 Major depressive disorder, single episode, unspecified: Secondary | ICD-10-CM | POA: Diagnosis present

## 2017-01-04 DIAGNOSIS — N183 Chronic kidney disease, stage 3 unspecified: Secondary | ICD-10-CM | POA: Diagnosis present

## 2017-01-04 DIAGNOSIS — I739 Peripheral vascular disease, unspecified: Secondary | ICD-10-CM | POA: Diagnosis present

## 2017-01-04 DIAGNOSIS — E782 Mixed hyperlipidemia: Secondary | ICD-10-CM | POA: Diagnosis present

## 2017-01-04 DIAGNOSIS — R42 Dizziness and giddiness: Secondary | ICD-10-CM

## 2017-01-04 DIAGNOSIS — T502X5A Adverse effect of carbonic-anhydrase inhibitors, benzothiadiazides and other diuretics, initial encounter: Secondary | ICD-10-CM | POA: Diagnosis present

## 2017-01-04 DIAGNOSIS — M542 Cervicalgia: Secondary | ICD-10-CM | POA: Diagnosis not present

## 2017-01-04 DIAGNOSIS — H547 Unspecified visual loss: Secondary | ICD-10-CM | POA: Diagnosis present

## 2017-01-04 DIAGNOSIS — Z9981 Dependence on supplemental oxygen: Secondary | ICD-10-CM

## 2017-01-04 DIAGNOSIS — I5033 Acute on chronic diastolic (congestive) heart failure: Secondary | ICD-10-CM | POA: Diagnosis present

## 2017-01-04 DIAGNOSIS — I4581 Long QT syndrome: Secondary | ICD-10-CM | POA: Diagnosis present

## 2017-01-04 DIAGNOSIS — I25119 Atherosclerotic heart disease of native coronary artery with unspecified angina pectoris: Secondary | ICD-10-CM | POA: Diagnosis present

## 2017-01-04 DIAGNOSIS — I130 Hypertensive heart and chronic kidney disease with heart failure and stage 1 through stage 4 chronic kidney disease, or unspecified chronic kidney disease: Secondary | ICD-10-CM | POA: Diagnosis present

## 2017-01-04 DIAGNOSIS — M109 Gout, unspecified: Secondary | ICD-10-CM | POA: Diagnosis present

## 2017-01-04 DIAGNOSIS — E860 Dehydration: Secondary | ICD-10-CM | POA: Diagnosis present

## 2017-01-04 DIAGNOSIS — M25511 Pain in right shoulder: Secondary | ICD-10-CM

## 2017-01-04 DIAGNOSIS — J9611 Chronic respiratory failure with hypoxia: Secondary | ICD-10-CM | POA: Diagnosis present

## 2017-01-04 DIAGNOSIS — Z8659 Personal history of other mental and behavioral disorders: Secondary | ICD-10-CM

## 2017-01-04 DIAGNOSIS — F419 Anxiety disorder, unspecified: Secondary | ICD-10-CM | POA: Diagnosis present

## 2017-01-04 DIAGNOSIS — E876 Hypokalemia: Secondary | ICD-10-CM

## 2017-01-04 DIAGNOSIS — I2510 Atherosclerotic heart disease of native coronary artery without angina pectoris: Secondary | ICD-10-CM | POA: Diagnosis present

## 2017-01-04 DIAGNOSIS — I252 Old myocardial infarction: Secondary | ICD-10-CM | POA: Diagnosis not present

## 2017-01-04 DIAGNOSIS — I5030 Unspecified diastolic (congestive) heart failure: Secondary | ICD-10-CM | POA: Diagnosis not present

## 2017-01-04 DIAGNOSIS — J9610 Chronic respiratory failure, unspecified whether with hypoxia or hypercapnia: Secondary | ICD-10-CM | POA: Diagnosis present

## 2017-01-04 DIAGNOSIS — Z7982 Long term (current) use of aspirin: Secondary | ICD-10-CM

## 2017-01-04 DIAGNOSIS — R4182 Altered mental status, unspecified: Secondary | ICD-10-CM | POA: Diagnosis not present

## 2017-01-04 DIAGNOSIS — Z87891 Personal history of nicotine dependence: Secondary | ICD-10-CM | POA: Diagnosis not present

## 2017-01-04 DIAGNOSIS — I4892 Unspecified atrial flutter: Secondary | ICD-10-CM | POA: Diagnosis not present

## 2017-01-04 DIAGNOSIS — N281 Cyst of kidney, acquired: Secondary | ICD-10-CM | POA: Diagnosis not present

## 2017-01-04 DIAGNOSIS — I10 Essential (primary) hypertension: Secondary | ICD-10-CM | POA: Diagnosis not present

## 2017-01-04 DIAGNOSIS — J449 Chronic obstructive pulmonary disease, unspecified: Secondary | ICD-10-CM | POA: Diagnosis not present

## 2017-01-04 DIAGNOSIS — K219 Gastro-esophageal reflux disease without esophagitis: Secondary | ICD-10-CM | POA: Diagnosis present

## 2017-01-04 DIAGNOSIS — M25461 Effusion, right knee: Secondary | ICD-10-CM

## 2017-01-04 DIAGNOSIS — M25569 Pain in unspecified knee: Secondary | ICD-10-CM

## 2017-01-04 DIAGNOSIS — Z888 Allergy status to other drugs, medicaments and biological substances status: Secondary | ICD-10-CM

## 2017-01-04 DIAGNOSIS — M7989 Other specified soft tissue disorders: Secondary | ICD-10-CM | POA: Diagnosis not present

## 2017-01-04 DIAGNOSIS — Z955 Presence of coronary angioplasty implant and graft: Secondary | ICD-10-CM

## 2017-01-04 DIAGNOSIS — R200 Anesthesia of skin: Secondary | ICD-10-CM | POA: Diagnosis not present

## 2017-01-04 HISTORY — DX: Chronic kidney disease, stage 3 (moderate): N18.3

## 2017-01-04 HISTORY — DX: Chronic kidney disease, stage 3 unspecified: N18.30

## 2017-01-04 HISTORY — DX: Dependence on supplemental oxygen: Z99.81

## 2017-01-04 LAB — URINALYSIS, ROUTINE W REFLEX MICROSCOPIC
BILIRUBIN URINE: NEGATIVE
Glucose, UA: NEGATIVE mg/dL
HGB URINE DIPSTICK: NEGATIVE
KETONES UR: NEGATIVE mg/dL
Leukocytes, UA: NEGATIVE
NITRITE: NEGATIVE
PH: 6 (ref 5.0–8.0)
Protein, ur: NEGATIVE mg/dL
Specific Gravity, Urine: 1.012 (ref 1.005–1.030)

## 2017-01-04 LAB — COMPREHENSIVE METABOLIC PANEL
ALBUMIN: 3.3 g/dL — AB (ref 3.5–5.0)
ALT: 17 U/L (ref 17–63)
AST: 25 U/L (ref 15–41)
Alkaline Phosphatase: 70 U/L (ref 38–126)
Anion gap: 17 — ABNORMAL HIGH (ref 5–15)
BILIRUBIN TOTAL: 0.9 mg/dL (ref 0.3–1.2)
BUN: 60 mg/dL — AB (ref 6–20)
CO2: 35 mmol/L — AB (ref 22–32)
Calcium: 8.7 mg/dL — ABNORMAL LOW (ref 8.9–10.3)
Chloride: 82 mmol/L — ABNORMAL LOW (ref 101–111)
Creatinine, Ser: 2.47 mg/dL — ABNORMAL HIGH (ref 0.61–1.24)
GFR calc Af Amer: 27 mL/min — ABNORMAL LOW (ref >60)
GFR calc non Af Amer: 24 mL/min — ABNORMAL LOW (ref >60)
GLUCOSE: 108 mg/dL — AB (ref 65–99)
SODIUM: 134 mmol/L — AB (ref 135–145)
TOTAL PROTEIN: 5.9 g/dL — AB (ref 6.5–8.1)

## 2017-01-04 LAB — CBC WITH DIFFERENTIAL/PLATELET
BASOS ABS: 0 10*3/uL (ref 0.0–0.1)
BASOS PCT: 0 %
Eosinophils Absolute: 0.3 10*3/uL (ref 0.0–0.7)
Eosinophils Relative: 2 %
HEMATOCRIT: 44 % (ref 39.0–52.0)
HEMOGLOBIN: 15 g/dL (ref 13.0–17.0)
Lymphocytes Relative: 14 %
Lymphs Abs: 1.6 10*3/uL (ref 0.7–4.0)
MCH: 29.2 pg (ref 26.0–34.0)
MCHC: 34.1 g/dL (ref 30.0–36.0)
MCV: 85.8 fL (ref 78.0–100.0)
MONO ABS: 1.8 10*3/uL — AB (ref 0.1–1.0)
Monocytes Relative: 15 %
NEUTROS ABS: 8.1 10*3/uL — AB (ref 1.7–7.7)
NEUTROS PCT: 69 %
Platelets: 255 10*3/uL (ref 150–400)
RBC: 5.13 MIL/uL (ref 4.22–5.81)
RDW: 14.4 % (ref 11.5–15.5)
WBC: 11.8 10*3/uL — ABNORMAL HIGH (ref 4.0–10.5)

## 2017-01-04 LAB — POTASSIUM: Potassium: 2.4 mmol/L — CL (ref 3.5–5.1)

## 2017-01-04 LAB — I-STAT TROPONIN, ED: Troponin i, poc: 0.02 ng/mL (ref 0.00–0.08)

## 2017-01-04 LAB — LIPASE, BLOOD: Lipase: 33 U/L (ref 11–51)

## 2017-01-04 LAB — TROPONIN I: Troponin I: <0.03 ng/mL (ref <0.03)

## 2017-01-04 LAB — ACETAMINOPHEN LEVEL

## 2017-01-04 LAB — SALICYLATE LEVEL: Salicylate Lvl: <7 mg/dL (ref 2.8–30.0)

## 2017-01-04 LAB — MAGNESIUM: Magnesium: 3 mg/dL — ABNORMAL HIGH (ref 1.7–2.4)

## 2017-01-04 MED ORDER — POTASSIUM CHLORIDE CRYS ER 20 MEQ PO TBCR
40.0000 meq | EXTENDED_RELEASE_TABLET | Freq: Two times a day (BID) | ORAL | Status: DC
  Administered 2017-01-04 – 2017-01-11 (×15): 40 meq via ORAL
  Filled 2017-01-04 (×15): qty 2

## 2017-01-04 MED ORDER — ACETAMINOPHEN 650 MG RE SUPP: 650.0000 mg | Freq: Four times a day (QID) | RECTAL | Status: DC | PRN

## 2017-01-04 MED ORDER — CLOPIDOGREL BISULFATE 75 MG PO TABS
75.0000 mg | ORAL_TABLET | Freq: Every day | ORAL | Status: DC
  Administered 2017-01-04 – 2017-01-11 (×8): 75 mg via ORAL
  Filled 2017-01-04 (×8): qty 1

## 2017-01-04 MED ORDER — SODIUM CHLORIDE 0.9 % IV BOLUS (SEPSIS)
1000.0000 mL | Freq: Once | INTRAVENOUS | Status: AC
  Administered 2017-01-04: 1000 mL via INTRAVENOUS

## 2017-01-04 MED ORDER — POTASSIUM CHLORIDE 10 MEQ/100ML IV SOLN
10.0000 meq | INTRAVENOUS | Status: AC
  Administered 2017-01-04 (×6): 10 meq via INTRAVENOUS
  Filled 2017-01-04 (×6): qty 100

## 2017-01-04 MED ORDER — FENTANYL CITRATE (PF) 100 MCG/2ML IJ SOLN
50.0000 ug | Freq: Once | INTRAMUSCULAR | Status: AC
  Administered 2017-01-04: 50 ug via INTRAVENOUS
  Filled 2017-01-04: qty 2

## 2017-01-04 MED ORDER — ATENOLOL 25 MG PO TABS: 25.0000 mg | ORAL_TABLET | Freq: Every morning | ORAL | Status: DC

## 2017-01-04 MED ORDER — ACETAMINOPHEN 325 MG PO TABS
650.0000 mg | ORAL_TABLET | Freq: Four times a day (QID) | ORAL | Status: DC | PRN
  Administered 2017-01-07: 650 mg via ORAL
  Filled 2017-01-04: qty 2

## 2017-01-04 MED ORDER — TAMSULOSIN HCL 0.4 MG PO CAPS
0.4000 mg | ORAL_CAPSULE | Freq: Every day | ORAL | Status: DC
  Administered 2017-01-05 – 2017-01-12 (×8): 0.4 mg via ORAL
  Filled 2017-01-04 (×9): qty 1

## 2017-01-04 MED ORDER — ONDANSETRON HCL 4 MG/2ML IJ SOLN
4.0000 mg | Freq: Once | INTRAMUSCULAR | Status: AC
  Administered 2017-01-04: 4 mg via INTRAVENOUS
  Filled 2017-01-04: qty 2

## 2017-01-04 MED ORDER — ALBUTEROL SULFATE (2.5 MG/3ML) 0.083% IN NEBU: 2.5000 mg | INHALATION_SOLUTION | Freq: Four times a day (QID) | RESPIRATORY_TRACT | Status: DC | PRN

## 2017-01-04 MED ORDER — HYDROCODONE-ACETAMINOPHEN 5-325 MG PO TABS
1.0000 | ORAL_TABLET | ORAL | Status: DC | PRN
  Administered 2017-01-04 – 2017-01-05 (×3): 1 via ORAL
  Administered 2017-01-06: 2 via ORAL
  Administered 2017-01-06: 1 via ORAL
  Administered 2017-01-06: 2 via ORAL
  Administered 2017-01-06: 1 via ORAL
  Administered 2017-01-07 – 2017-01-08 (×4): 2 via ORAL
  Administered 2017-01-09 – 2017-01-10 (×2): 1 via ORAL
  Filled 2017-01-04: qty 1
  Filled 2017-01-04 (×2): qty 2
  Filled 2017-01-04 (×3): qty 1
  Filled 2017-01-04 (×2): qty 2
  Filled 2017-01-04: qty 1
  Filled 2017-01-04: qty 2
  Filled 2017-01-04 (×2): qty 1
  Filled 2017-01-04: qty 2
  Filled 2017-01-04: qty 1

## 2017-01-04 MED ORDER — SODIUM CHLORIDE 0.9 % IV SOLN
INTRAVENOUS | Status: DC
  Administered 2017-01-04 – 2017-01-06 (×4): via INTRAVENOUS

## 2017-01-04 MED ORDER — ATENOLOL 25 MG PO TABS
25.0000 mg | ORAL_TABLET | Freq: Every morning | ORAL | Status: DC
  Administered 2017-01-05: 25 mg via ORAL
  Filled 2017-01-04 (×3): qty 1

## 2017-01-04 MED ORDER — FINASTERIDE 5 MG PO TABS
5.0000 mg | ORAL_TABLET | Freq: Every day | ORAL | Status: DC
  Administered 2017-01-04 – 2017-01-12 (×9): 5 mg via ORAL
  Filled 2017-01-04 (×7): qty 1

## 2017-01-04 MED ORDER — ASPIRIN EC 81 MG PO TBEC
81.0000 mg | DELAYED_RELEASE_TABLET | Freq: Every day | ORAL | Status: DC
  Administered 2017-01-04 – 2017-01-12 (×9): 81 mg via ORAL
  Filled 2017-01-04 (×9): qty 1

## 2017-01-04 MED ORDER — HEPARIN SODIUM (PORCINE) 5000 UNIT/ML IJ SOLN
5000.0000 [IU] | Freq: Three times a day (TID) | INTRAMUSCULAR | Status: DC
  Administered 2017-01-04 – 2017-01-05 (×2): 5000 [IU] via SUBCUTANEOUS
  Filled 2017-01-04: qty 1

## 2017-01-04 MED ORDER — PROMETHAZINE HCL 25 MG PO TABS: 12.5000 mg | ORAL_TABLET | Freq: Four times a day (QID) | ORAL | Status: DC | PRN

## 2017-01-04 NOTE — ED Notes (Signed)
Dinner tray ordered; heart healthy diet 

## 2017-01-04 NOTE — ED Notes (Signed)
Pt aware that a urine sample is needed.  

## 2017-01-04 NOTE — ED Notes (Signed)
Patient transported to [facility] 

## 2017-01-04 NOTE — ED Notes (Signed)
Patient transported to X-ray 
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
## 2017-01-04 NOTE — ED Notes (Signed)
Admitting MD aware of patient's low BPs, see new order. Patient remains asymptomatic and states his HR and BP normally run low.

## 2017-01-04 NOTE — ED Notes (Signed)
Attempted to obtain urine specimen; Pt unable to provide one at this time 

## 2017-01-04 NOTE — ED Triage Notes (Signed)
PT here via EMS with c/o right arm pain, and dizziness x 10 days with some sob. EMS reports pts bp was 90 palp. Othostatic bp 113/71 to 97/53. Hx of CHF. NS bolus given. Rates pain 10/10. No distress noted.

## 2017-01-04 NOTE — ED Notes (Signed)
Patient c/o worsening pain in his R arm, forearm to his hand, requesting more pain medication. Admitting MD paged to notify.

## 2017-01-05 ENCOUNTER — Encounter (HOSPITAL_COMMUNITY): Payer: Self-pay | Admitting: *Deleted

## 2017-01-05 DIAGNOSIS — E876 Hypokalemia: Secondary | ICD-10-CM | POA: Diagnosis present

## 2017-01-05 DIAGNOSIS — J9610 Chronic respiratory failure, unspecified whether with hypoxia or hypercapnia: Secondary | ICD-10-CM | POA: Diagnosis present

## 2017-01-05 DIAGNOSIS — J9611 Chronic respiratory failure with hypoxia: Secondary | ICD-10-CM | POA: Diagnosis present

## 2017-01-05 DIAGNOSIS — F419 Anxiety disorder, unspecified: Secondary | ICD-10-CM | POA: Diagnosis present

## 2017-01-05 DIAGNOSIS — I2510 Atherosclerotic heart disease of native coronary artery without angina pectoris: Secondary | ICD-10-CM | POA: Diagnosis present

## 2017-01-05 DIAGNOSIS — I5030 Unspecified diastolic (congestive) heart failure: Secondary | ICD-10-CM | POA: Diagnosis not present

## 2017-01-05 DIAGNOSIS — M25511 Pain in right shoulder: Secondary | ICD-10-CM | POA: Diagnosis present

## 2017-01-05 DIAGNOSIS — T502X5A Adverse effect of carbonic-anhydrase inhibitors, benzothiadiazides and other diuretics, initial encounter: Secondary | ICD-10-CM | POA: Diagnosis present

## 2017-01-05 DIAGNOSIS — J449 Chronic obstructive pulmonary disease, unspecified: Secondary | ICD-10-CM | POA: Diagnosis present

## 2017-01-05 DIAGNOSIS — M79604 Pain in right leg: Secondary | ICD-10-CM | POA: Diagnosis not present

## 2017-01-05 DIAGNOSIS — N183 Chronic kidney disease, stage 3 (moderate): Secondary | ICD-10-CM | POA: Diagnosis present

## 2017-01-05 DIAGNOSIS — Z87891 Personal history of nicotine dependence: Secondary | ICD-10-CM | POA: Diagnosis not present

## 2017-01-05 DIAGNOSIS — H547 Unspecified visual loss: Secondary | ICD-10-CM | POA: Diagnosis present

## 2017-01-05 DIAGNOSIS — E860 Dehydration: Secondary | ICD-10-CM | POA: Diagnosis present

## 2017-01-05 DIAGNOSIS — I4581 Long QT syndrome: Secondary | ICD-10-CM | POA: Diagnosis present

## 2017-01-05 DIAGNOSIS — M7989 Other specified soft tissue disorders: Secondary | ICD-10-CM | POA: Diagnosis not present

## 2017-01-05 DIAGNOSIS — I130 Hypertensive heart and chronic kidney disease with heart failure and stage 1 through stage 4 chronic kidney disease, or unspecified chronic kidney disease: Secondary | ICD-10-CM | POA: Diagnosis present

## 2017-01-05 DIAGNOSIS — E7800 Pure hypercholesterolemia, unspecified: Secondary | ICD-10-CM | POA: Diagnosis not present

## 2017-01-05 DIAGNOSIS — I252 Old myocardial infarction: Secondary | ICD-10-CM | POA: Diagnosis not present

## 2017-01-05 DIAGNOSIS — K219 Gastro-esophageal reflux disease without esophagitis: Secondary | ICD-10-CM | POA: Diagnosis present

## 2017-01-05 DIAGNOSIS — I739 Peripheral vascular disease, unspecified: Secondary | ICD-10-CM | POA: Diagnosis present

## 2017-01-05 DIAGNOSIS — I484 Atypical atrial flutter: Secondary | ICD-10-CM | POA: Diagnosis not present

## 2017-01-05 DIAGNOSIS — I5033 Acute on chronic diastolic (congestive) heart failure: Secondary | ICD-10-CM | POA: Diagnosis present

## 2017-01-05 DIAGNOSIS — M109 Gout, unspecified: Secondary | ICD-10-CM | POA: Diagnosis present

## 2017-01-05 DIAGNOSIS — F329 Major depressive disorder, single episode, unspecified: Secondary | ICD-10-CM | POA: Diagnosis present

## 2017-01-05 DIAGNOSIS — N400 Enlarged prostate without lower urinary tract symptoms: Secondary | ICD-10-CM | POA: Diagnosis present

## 2017-01-05 DIAGNOSIS — E785 Hyperlipidemia, unspecified: Secondary | ICD-10-CM | POA: Diagnosis present

## 2017-01-05 DIAGNOSIS — N179 Acute kidney failure, unspecified: Secondary | ICD-10-CM | POA: Diagnosis present

## 2017-01-05 LAB — COMPREHENSIVE METABOLIC PANEL
ALK PHOS: 55 U/L (ref 38–126)
ALT: 15 U/L — AB (ref 17–63)
AST: 18 U/L (ref 15–41)
Albumin: 2.5 g/dL — ABNORMAL LOW (ref 3.5–5.0)
Anion gap: 10 (ref 5–15)
BILIRUBIN TOTAL: 0.8 mg/dL (ref 0.3–1.2)
BUN: 48 mg/dL — AB (ref 6–20)
CO2: 31 mmol/L (ref 22–32)
Calcium: 7.5 mg/dL — ABNORMAL LOW (ref 8.9–10.3)
Chloride: 96 mmol/L — ABNORMAL LOW (ref 101–111)
Creatinine, Ser: 2.09 mg/dL — ABNORMAL HIGH (ref 0.61–1.24)
GFR calc Af Amer: 33 mL/min — ABNORMAL LOW (ref >60)
GFR, EST NON AFRICAN AMERICAN: 29 mL/min — AB (ref >60)
Glucose, Bld: 120 mg/dL — ABNORMAL HIGH (ref 65–99)
Potassium: 2.5 mmol/L — CL (ref 3.5–5.1)
Sodium: 137 mmol/L (ref 135–145)
TOTAL PROTEIN: 4.5 g/dL — AB (ref 6.5–8.1)

## 2017-01-05 LAB — CBC
HEMATOCRIT: 38.1 % — AB (ref 39.0–52.0)
Hemoglobin: 12.5 g/dL — ABNORMAL LOW (ref 13.0–17.0)
MCH: 28.7 pg (ref 26.0–34.0)
MCHC: 32.8 g/dL (ref 30.0–36.0)
MCV: 87.6 fL (ref 78.0–100.0)
PLATELETS: 200 10*3/uL (ref 150–400)
RBC: 4.35 MIL/uL (ref 4.22–5.81)
RDW: 14.9 % (ref 11.5–15.5)
WBC: 7.7 10*3/uL (ref 4.0–10.5)

## 2017-01-05 LAB — RESPIRATORY PANEL BY PCR
ADENOVIRUS-RVPPCR: NOT DETECTED
Bordetella pertussis: NOT DETECTED
CORONAVIRUS 229E-RVPPCR: NOT DETECTED
CORONAVIRUS HKU1-RVPPCR: NOT DETECTED
CORONAVIRUS NL63-RVPPCR: NOT DETECTED
CORONAVIRUS OC43-RVPPCR: NOT DETECTED
Chlamydophila pneumoniae: NOT DETECTED
INFLUENZA A H1 2009-RVPPR: NOT DETECTED
INFLUENZA A H3-RVPPCR: NOT DETECTED
INFLUENZA B-RVPPCR: NOT DETECTED
Influenza A H1: NOT DETECTED
Influenza A: NOT DETECTED
MYCOPLASMA PNEUMONIAE-RVPPCR: NOT DETECTED
Metapneumovirus: NOT DETECTED
PARAINFLUENZA VIRUS 1-RVPPCR: NOT DETECTED
PARAINFLUENZA VIRUS 4-RVPPCR: NOT DETECTED
Parainfluenza Virus 2: NOT DETECTED
Parainfluenza Virus 3: NOT DETECTED
Respiratory Syncytial Virus: NOT DETECTED
Rhinovirus / Enterovirus: NOT DETECTED

## 2017-01-05 LAB — TROPONIN I: Troponin I: <0.03 ng/mL (ref <0.03)

## 2017-01-05 MED ORDER — POTASSIUM CHLORIDE 10 MEQ/100ML IV SOLN
INTRAVENOUS | Status: AC
  Filled 2017-01-05: qty 100

## 2017-01-05 MED ORDER — BISACODYL 5 MG PO TBEC
5.0000 mg | DELAYED_RELEASE_TABLET | Freq: Every day | ORAL | Status: DC
  Administered 2017-01-05 – 2017-01-06 (×2): 5 mg via ORAL
  Filled 2017-01-05 (×2): qty 1

## 2017-01-05 MED ORDER — POLYETHYLENE GLYCOL 3350 17 G PO PACK
17.0000 g | PACK | Freq: Every day | ORAL | Status: DC
  Administered 2017-01-05 – 2017-01-06 (×2): 17 g via ORAL
  Filled 2017-01-05 (×2): qty 1

## 2017-01-05 MED ORDER — POTASSIUM CHLORIDE 10 MEQ/100ML IV SOLN
10.0000 meq | INTRAVENOUS | Status: DC
  Administered 2017-01-05 (×4): 10 meq via INTRAVENOUS
  Filled 2017-01-05 (×5): qty 100
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
## 2017-01-06 DIAGNOSIS — N183 Chronic kidney disease, stage 3 (moderate): Secondary | ICD-10-CM

## 2017-01-06 LAB — BASIC METABOLIC PANEL
Anion gap: 6 (ref 5–15)
BUN: 19 mg/dL (ref 6–20)
CALCIUM: 8.4 mg/dL — AB (ref 8.9–10.3)
CHLORIDE: 103 mmol/L (ref 101–111)
CO2: 31 mmol/L (ref 22–32)
CREATININE: 1.29 mg/dL — AB (ref 0.61–1.24)
GFR calc non Af Amer: 52 mL/min — ABNORMAL LOW (ref >60)
Glucose, Bld: 102 mg/dL — ABNORMAL HIGH (ref 65–99)
Potassium: 3.4 mmol/L — ABNORMAL LOW (ref 3.5–5.1)
SODIUM: 140 mmol/L (ref 135–145)

## 2017-01-06 LAB — CBC
HCT: 38.5 % — ABNORMAL LOW (ref 39.0–52.0)
HEMOGLOBIN: 12.4 g/dL — AB (ref 13.0–17.0)
MCH: 28.6 pg (ref 26.0–34.0)
MCHC: 32.2 g/dL (ref 30.0–36.0)
MCV: 88.9 fL (ref 78.0–100.0)
Platelets: 211 10*3/uL (ref 150–400)
RBC: 4.33 MIL/uL (ref 4.22–5.81)
RDW: 15.1 % (ref 11.5–15.5)
WBC: 9.9 10*3/uL (ref 4.0–10.5)

## 2017-01-06 MED ORDER — SENNA 8.6 MG PO TABS
1.0000 | ORAL_TABLET | Freq: Every day | ORAL | Status: DC
  Administered 2017-01-06 – 2017-01-09 (×4): 8.6 mg via ORAL
  Filled 2017-01-06 (×6): qty 1
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
## 2017-01-06 NOTE — Progress Notes (Signed)
On reassessment pt's right foot found warm to touch and patient is complaining pain too, paged MD, per verbal order put the stat order of VAS duplex to rule out DVT, Norco 2 tab given, will continue to monitor

## 2017-01-06 NOTE — Progress Notes (Signed)
Pt is sleepy this morning, said that did not sleep well last night, Pain med given for a complain of generalized pain, no complain of SOB, Miralax given for constipation, tolerated diet and medicines, vitals stable, no any other specific complain, will continue to monitor the patient

## 2017-01-07 ENCOUNTER — Inpatient Hospital Stay (HOSPITAL_COMMUNITY): Payer: Medicare PPO

## 2017-01-07 DIAGNOSIS — M79604 Pain in right leg: Secondary | ICD-10-CM

## 2017-01-07 DIAGNOSIS — I5030 Unspecified diastolic (congestive) heart failure: Secondary | ICD-10-CM

## 2017-01-07 DIAGNOSIS — M7989 Other specified soft tissue disorders: Secondary | ICD-10-CM

## 2017-01-07 LAB — ECHOCARDIOGRAM COMPLETE
CHL CUP DOP CALC LVOT VTI: 24.8 cm
CHL CUP MV DEC (S): 246
E/e' ratio: 6.03
EWDT: 246 ms
FS: 42 % (ref 28–44)
IVS/LV PW RATIO, ED: 0.94
LA ID, A-P, ES: 29 mm
LA vol A4C: 36.2 ml
LA vol index: 16.7 mL/m2
LA vol: 37.9 mL
LADIAMINDEX: 1.28 cm/m2
LDCA: 3.46 cm2
LEFT ATRIUM END SYS DIAM: 29 mm
LV E/e' medial: 6.03
LV E/e'average: 6.03
LV PW d: 10.6 mm — AB (ref 0.6–1.1)
LV TDI E'LATERAL: 13.6
LV TDI E'MEDIAL: 8.92
LVELAT: 13.6 cm/s
LVOT peak grad rest: 5 mmHg
LVOTD: 21 mm
LVOTPV: 117 cm/s
LVOTSV: 86 mL
MV Peak grad: 3 mmHg
MV pk A vel: 98.5 m/s
MV pk E vel: 82 m/s
RV LATERAL S' VELOCITY: 18.9 cm/s
TAPSE: 25.9 mm
WEIGHTICAEL: 3506.2 [oz_av]

## 2017-01-07 LAB — BASIC METABOLIC PANEL
Anion gap: 7 (ref 5–15)
BUN: 14 mg/dL (ref 6–20)
CALCIUM: 8.6 mg/dL — AB (ref 8.9–10.3)
CHLORIDE: 102 mmol/L (ref 101–111)
CO2: 30 mmol/L (ref 22–32)
CREATININE: 1.18 mg/dL (ref 0.61–1.24)
GFR, EST NON AFRICAN AMERICAN: 58 mL/min — AB (ref >60)
Glucose, Bld: 96 mg/dL (ref 65–99)
Potassium: 4.2 mmol/L (ref 3.5–5.1)
SODIUM: 139 mmol/L (ref 135–145)

## 2017-01-07 LAB — MAGNESIUM: MAGNESIUM: 2.2 mg/dL (ref 1.7–2.4)

## 2017-01-07 MED ORDER — POLYETHYLENE GLYCOL 3350 17 G PO PACK
17.0000 g | PACK | Freq: Two times a day (BID) | ORAL | Status: DC
  Administered 2017-01-07 – 2017-01-09 (×3): 17 g via ORAL
  Filled 2017-01-07 (×7): qty 1

## 2017-01-07 MED ORDER — FUROSEMIDE 10 MG/ML IJ SOLN
40.0000 mg | Freq: Once | INTRAMUSCULAR | Status: AC
  Administered 2017-01-07: 40 mg via INTRAVENOUS
  Filled 2017-01-07: qty 4

## 2017-01-07 MED ORDER — SENNA 8.6 MG PO TABS: 1.0000 | ORAL_TABLET | Freq: Every day | ORAL | Status: DC

## 2017-01-07 MED ORDER — METOLAZONE 2.5 MG PO TABS
2.5000 mg | ORAL_TABLET | Freq: Once | ORAL | Status: AC
  Administered 2017-01-07: 2.5 mg via ORAL
  Filled 2017-01-07: qty 1

## 2017-01-07 MED ORDER — BISACODYL 10 MG RE SUPP
10.0000 mg | Freq: Once | RECTAL | Status: AC
  Administered 2017-01-07: 10 mg via RECTAL
  Filled 2017-01-07: qty 1

## 2017-01-07 NOTE — Progress Notes (Signed)
PT Cancellation Note  Patient Details Name: Mitchell [name] MRN: [id number] DOB: [birth year]   Cancelled Treatment:    Reason Eval/Treat Not Completed: Medical issues which prohibited therapy RN states that pt is having a lot of R LE pain and request hold therapy until Doppler to r/o DVT. PT will check back this afternoon as able. Mitchell [name] B. Beverely Risen PT, DPT Acute Rehabilitation  [phone] Pager [phone]     [name] Fleet 01/07/2017, 12:08 PM
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
## 2017-01-07 NOTE — Progress Notes (Signed)
  Echocardiogram 2D Echocardiogram has been performed.  Delcie Roch 01/07/2017, 4:21 PM

## 2017-01-07 NOTE — Consult Note (Signed)
   [facility] CM Inpatient Consult   01/07/2017  Aul Mangieri 1941-01-23 [id number]  Patient was assessed for [facility] for community services. Patient was previously active with [facility].  Met with patient at bedside regarding being restarted with [facility] services. Patient is currently in patient care and asked if writer would come back later.  Noted that there is  a consent on file.  Will follow up for progression and dispositon needs.  Of note, [facility] services does not replace or interfere with any services that are arranged by inpatient case management or social work. For additional questions or referrals please contact:  Natividad Brood, RN BSN [facility]iaison  [phone] business mobile phone Toll free office [phone]   1515:  Returned to speak with patient regarding restart of [facility] services.  Patient states, "well. I am in worse shape now and I am not sure of what you all can really do for me.  My Potassium was supposed to be 5.2 and when I came in it was 2.5.  I am legally blind 20/200 and can you just check back with me cause I am having some pain in my legs and I don't know what's going to happen honey."  Card given with contact information.  Will follow for progress and disposition needs.

## 2017-01-07 NOTE — Progress Notes (Signed)
Pt. Right leg swollen and is very warm to touch, temp 99.0, negative for DVT and SVT per vascular xray. Pt say pain in leg is constant and was given vicodin. Non tele per Dr. Magdalene Molly. Other vs is stable.

## 2017-01-07 NOTE — Progress Notes (Signed)
Paged Dr. with concerns and clarity about discontinuing pt cardiac monitor since his potassium had been low over the last few days.

## 2017-01-07 NOTE — Progress Notes (Signed)
VASCULAR LAB PRELIMINARY  PRELIMINARY  PRELIMINARY  PRELIMINARY  Right lower extremity venous duplex completed.    Preliminary report:  There is no DVT or SVT noted in the right lower extremity.   Garen Woolbright, RVT 01/07/2017, 1:44 PM

## 2017-01-08 LAB — CBC
HCT: 40.6 % (ref 39.0–52.0)
Hemoglobin: 12.9 g/dL — ABNORMAL LOW (ref 13.0–17.0)
MCH: 28.6 pg (ref 26.0–34.0)
MCHC: 31.8 g/dL (ref 30.0–36.0)
MCV: 90 fL (ref 78.0–100.0)
PLATELETS: 209 10*3/uL (ref 150–400)
RBC: 4.51 MIL/uL (ref 4.22–5.81)
RDW: 15.4 % (ref 11.5–15.5)
WBC: 12 10*3/uL — AB (ref 4.0–10.5)

## 2017-01-08 LAB — BASIC METABOLIC PANEL
Anion gap: 8 (ref 5–15)
BUN: 14 mg/dL (ref 6–20)
CO2: 32 mmol/L (ref 22–32)
CREATININE: 1.27 mg/dL — AB (ref 0.61–1.24)
Calcium: 8.7 mg/dL — ABNORMAL LOW (ref 8.9–10.3)
Chloride: 98 mmol/L — ABNORMAL LOW (ref 101–111)
GFR calc Af Amer: >60 mL/min (ref >60)
GFR, EST NON AFRICAN AMERICAN: 53 mL/min — AB (ref >60)
Glucose, Bld: 100 mg/dL — ABNORMAL HIGH (ref 65–99)
Potassium: 4.4 mmol/L (ref 3.5–5.1)
SODIUM: 138 mmol/L (ref 135–145)

## 2017-01-08 MED ORDER — METHYLPREDNISOLONE SODIUM SUCC 40 MG IJ SOLR
40.0000 mg | Freq: Once | INTRAMUSCULAR | Status: AC
Start: 2017-01-08 — End: 2017-01-08
  Administered 2017-01-08: 40 mg via INTRAVENOUS
  Filled 2017-01-08: qty 1

## 2017-01-08 MED ORDER — COLCHICINE 0.6 MG PO TABS
0.6000 mg | ORAL_TABLET | Freq: Every day | ORAL | Status: DC
  Administered 2017-01-08: 0.6 mg via ORAL
  Filled 2017-01-08: qty 1

## 2017-01-08 MED ORDER — DIPHENHYDRAMINE HCL 25 MG PO CAPS
25.0000 mg | ORAL_CAPSULE | Freq: Once | ORAL | Status: AC
  Administered 2017-01-08: 25 mg via ORAL
  Filled 2017-01-08: qty 1

## 2017-01-08 MED ORDER — COLCHICINE 0.6 MG PO TABS
1.2000 mg | ORAL_TABLET | Freq: Every day | ORAL | Status: DC
  Administered 2017-01-08: 1.2 mg via ORAL
  Filled 2017-01-08: qty 2

## 2017-01-08 MED ORDER — COLCHICINE 0.6 MG PO TABS
0.6000 mg | ORAL_TABLET | Freq: Two times a day (BID) | ORAL | Status: DC
  Administered 2017-01-09 – 2017-01-12 (×7): 0.6 mg via ORAL
  Filled 2017-01-08 (×7): qty 1

## 2017-01-08 MED ORDER — FUROSEMIDE 40 MG PO TABS
40.0000 mg | ORAL_TABLET | Freq: Every day | ORAL | Status: DC
  Administered 2017-01-08 – 2017-01-10 (×3): 40 mg via ORAL
  Filled 2017-01-08 (×3): qty 1
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
## 2017-01-08 NOTE — [place] FL2 (Signed)
[place] MEDICAID FL2 LEVEL OF CARE SCREENING TOOL     IDENTIFICATION  Patient Name: Mitchell [name] Birthdate: 01-Dec-1940 Sex: male Admission Date (Current Location): 01/04/2017  [facility] and [place] Number:  Producer, television/film/video and Address:  [facility], [address], [place], [place] [address]      Provider Number: [id number]  Attending Physician Name and Address:  Rhetta Mura, MD  Relative Name and Phone Number:       Current Level of Care: Hospital Recommended Level of Care: Skilled Nursing Facility Prior Approval Number:    Date Approved/Denied:   PASRR Number: Pending; manual review  Discharge Plan: SNF    Current Diagnoses: Patient Active Problem List   Diagnosis Date Noted  . Dizziness 01/04/2017  . Right shoulder pain 01/04/2017  . Hypokalemia 01/04/2017  . Influenza 05/10/2016  . CKD (chronic kidney disease), stage III 05/10/2016  . Acute respiratory failure with hypoxia (HCC) 05/02/2016  . AKI (acute kidney injury) (HCC) 05/02/2016  . Gout flare 05/02/2016  . Chronic respiratory failure (HCC) 03/15/2015  . Vision impairment 01/31/2015  . Right foot pain 01/31/2015  . Obesity 01/31/2015  . Alcohol use 01/31/2015  . COPD (chronic obstructive pulmonary disease) (HCC) 04/30/2013  . Dyspnea 02/14/2012  . Hyperlipemia   . History of depression   . Chest pain 01/23/2012  . CAD (coronary artery disease) 01/23/2012  . Cholelithiasis   . Essential hypertension 04/29/2011    Orientation RESPIRATION BLADDER Height & Weight     Self, Time, Situation, Place  O2 ([place] 5L) External catheter, Incontinent (placed 01/07/17) Weight:  (pt refused to stand) Height:     BEHAVIORAL SYMPTOMS/MOOD NEUROLOGICAL BOWEL NUTRITION STATUS      Continent Diet (heart healthy)  AMBULATORY STATUS COMMUNICATION OF NEEDS Skin   Extensive Assist Verbally Normal                       Personal Care Assistance Level of Assistance   Bathing, Dressing, Feeding Bathing Assistance: Maximum assistance Feeding assistance: Independent Dressing Assistance: Maximum assistance     Functional Limitations Info             SPECIAL CARE FACTORS FREQUENCY  PT (By licensed PT), OT (By licensed OT)     PT Frequency: 5x/wk OT Frequency: 5x/wk            Contractures      Additional Factors Info  Code Status, Allergies Code Status Info: Full Allergies Info: Lipitor Atorvastatin, Statins           Current Medications (01/08/2017):  This is the current hospital active medication list Current Facility-Administered Medications  Medication Dose Route Frequency Provider Last Rate Last Dose  . acetaminophen (TYLENOL) tablet 650 mg  650 mg Oral Q6H PRN Marcos Eke, PA-C   650 mg at 01/07/17 8119   Or  . acetaminophen (TYLENOL) suppository 650 mg  650 mg Rectal Q6H PRN Marcos Eke, PA-C      . albuterol (PROVENTIL) (2.5 MG/3ML) 0.083% nebulizer solution 2.5 mg  2.5 mg Inhalation Q6H PRN Marcos Eke, PA-C      . aspirin EC tablet 81 mg  81 mg Oral Daily Marcos Eke, PA-C   81 mg at 01/08/17 0931  . clopidogrel (PLAVIX) tablet 75 mg  75 mg Oral Daily Marcos Eke, PA-C   75 mg at 01/08/17 0931  . colchicine tablet 0.6 mg  0.6 mg Oral Daily Samtani,  Jai-Gurmukh, MD   0.6 mg at 01/08/17 1601  . finasteride (PROSCAR) tablet 5 mg  5 mg Oral Daily Marcos Eke, PA-C   5 mg at 01/08/17 0931  . furosemide (LASIX) tablet 40 mg  40 mg Oral Daily Rhetta Mura, MD   40 mg at 01/08/17 1603  . HYDROcodone-acetaminophen (NORCO/VICODIN) 5-325 MG per tablet 1-2 tablet  1-2 tablet Oral Q4H PRN Marcos Eke, PA-C   2 tablet at 01/08/17 0554  . polyethylene glycol (MIRALAX / GLYCOLAX) packet 17 g  17 g Oral BID Alison Murray, MD   17 g at 01/08/17 0931  . potassium chloride SA (K-DUR,KLOR-CON) CR tablet 40 mEq  40 mEq Oral BID Alison Murray, MD   40 mEq at 01/08/17 0931  . promethazine (PHENERGAN) tablet 12.5  mg  12.5 mg Oral Q6H PRN Marcos Eke, PA-C      . senna (SENOKOT) tablet 8.6 mg  1 tablet Oral Daily Alison Murray, MD   8.6 mg at 01/08/17 0931  . tamsulosin (FLOMAX) capsule 0.4 mg  0.4 mg Oral QPC breakfast Marcos Eke, PA-C   0.4 mg at 01/08/17 1610     Discharge Medications: Please see discharge summary for a list of discharge medications.  Relevant Imaging Results:  Relevant Lab Results:   Additional Information SS#: [id number]  Baldemar Lenis, LCSW
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
## 2017-01-09 ENCOUNTER — Inpatient Hospital Stay (HOSPITAL_COMMUNITY): Payer: Medicare PPO

## 2017-01-09 MED ORDER — BUPIVACAINE HCL (PF) 0.5 % IJ SOLN
10.0000 mL | Freq: Once | INTRAMUSCULAR | Status: AC
  Administered 2017-01-10: 10 mL
  Filled 2017-01-09: qty 10

## 2017-01-09 MED ORDER — METHYLPREDNISOLONE ACETATE 80 MG/ML IJ SUSP
80.0000 mg | Freq: Once | INTRAMUSCULAR | Status: DC
  Filled 2017-01-09: qty 1

## 2017-01-09 MED ORDER — DIPHENHYDRAMINE HCL 25 MG PO CAPS
25.0000 mg | ORAL_CAPSULE | Freq: Every evening | ORAL | Status: DC | PRN
  Administered 2017-01-09 – 2017-01-11 (×3): 25 mg via ORAL
  Filled 2017-01-09 (×3): qty 1

## 2017-01-09 NOTE — Clinical Social Work Note (Signed)
Clinical Social Work Assessment  Patient Details  Name: Mitchell [name] MRN: [id number] Date of Birth: 1940/05/12  Date of referral:  01/09/17               Reason for consult:  Facility Placement, Discharge Planning                Permission sought to share information with:  Facility Sport and exercise psychologist, Family Supports Permission granted to share information::  Yes, Verbal Permission Granted  Name::     Mitchell [name]  Agency::  SNF's  Relationship::  Ex-wife  Contact Information:  [phone]  Housing/Transportation Living arrangements for the past 2 months:  Apartment Source of Information:  Patient, Medical Team, Other (Comment Required) (Ex-wife) Patient Interpreter Needed:  None Criminal Activity/Legal Involvement Pertinent to Current Situation/Hospitalization:  No - Comment as needed Significant Relationships:  Adult Children, Other(Comment) (Ex-wife) Lives with:  Self Do you feel safe going back to the place where you live?  Yes Need for family participation in patient care:  Yes (Comment)  Care giving concerns:  PT recommending SNF once medically stable for discharge.   Social Worker assessment / plan:  CSW met with patient. No supports at bedside. CSW introduced role and explained that PT recommendations would be discussed. Patient had CSW call his ex-wife to discuss SNF. Her first preference is [facility] because it is close to his home. He definitely does not want [facility] because his father passed away there. Patient is hopeful that he can at least ambulate on his right leg with a limp. If so, he would like to return home with home health. RNCM notified. No further concerns. CSW encouraged patient to contact CSW as needed. CSW will continue to follow patient for support and facilitate discharge to SNF, if agreeable, once medically stable.  Employment status:  Retired Nurse, adult PT Recommendations:  [place] / Referral to community resources:  [place]  Patient/Family's Response to care:  Patient would prefer home health if he is able to get to his chair, bed, and bathroom. Patient's family supportive and involved in patient's care. Patient appreciated social work intervention.  Patient/Family's Understanding of and Emotional Response to Diagnosis, Current Treatment, and Prognosis:  Patient has a good understanding of the reason for admission and why PT is recommending SNF although he prefers home health. Patient appears happy with hospital care.  Emotional Assessment Appearance:  Appears stated age Attitude/Demeanor/Rapport:  Other (Pleasant) Affect (typically observed):  Accepting, Appropriate, Calm, Pleasant Orientation:  Oriented to Self, Oriented to Place, Oriented to  Time, Oriented to Situation Alcohol / Substance use:  Never Used Psych involvement (Current and /or in the community):  No (Comment)  Discharge Needs  Concerns to be addressed:  Care Coordination Readmission within the last 30 days:  No Current discharge risk:  Dependent with Mobility, Lives alone Barriers to Discharge:  Continued Medical Work up, Ship broker, Programmer, applications ([place])   Candie Chroman, LCSW 01/09/2017, 11:23 AM
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
## 2017-01-10 ENCOUNTER — Inpatient Hospital Stay (HOSPITAL_COMMUNITY): Payer: Medicare PPO

## 2017-01-10 DIAGNOSIS — M25461 Effusion, right knee: Secondary | ICD-10-CM

## 2017-01-10 DIAGNOSIS — M109 Gout, unspecified: Secondary | ICD-10-CM

## 2017-01-10 DIAGNOSIS — N179 Acute kidney failure, unspecified: Principal | ICD-10-CM

## 2017-01-10 DIAGNOSIS — E7800 Pure hypercholesterolemia, unspecified: Secondary | ICD-10-CM

## 2017-01-10 DIAGNOSIS — E860 Dehydration: Secondary | ICD-10-CM

## 2017-01-10 DIAGNOSIS — I2510 Atherosclerotic heart disease of native coronary artery without angina pectoris: Secondary | ICD-10-CM

## 2017-01-10 DIAGNOSIS — M1711 Unilateral primary osteoarthritis, right knee: Secondary | ICD-10-CM

## 2017-01-10 DIAGNOSIS — I484 Atypical atrial flutter: Secondary | ICD-10-CM

## 2017-01-10 LAB — ECHOCARDIOGRAM COMPLETE
EWDT: 574 ms
LA vol: 37.4 mL
LAVOLA4C: 41.3 mL
LAVOLIN: 16.9 mL/m2
LDCA: 3.14 cm2
LV SIMPSON'S DISK: 31
LV dias vol: 68 mL (ref 62–150)
LV sys vol index: 21 mL/m2
LV sys vol: 47 mL (ref 21–61)
LVDIAVOLIN: 31 mL/m2
LVOT SV: 43 mL
LVOT VTI: 13.8 cm
LVOT peak grad rest: 4 mmHg
LVOTD: 20 mm
LVOTPV: 101 cm/s
MV Dec: 574
MV pk A vel: 96.5 m/s
MVPG: 4 mmHg
MVPKEVEL: 96 m/s
P 1/2 time: 168 ms
RV TAPSE: 12.2 mm
Stroke v: 21 ml
Weight: 3477.98 oz

## 2017-01-10 LAB — SYNOVIAL CELL COUNT + DIFF, W/ CRYSTALS
Eosinophils-Synovial: 0 % (ref 0–1)
Lymphocytes-Synovial Fld: 1 % (ref 0–20)
Monocyte-Macrophage-Synovial Fluid: 23 % — ABNORMAL LOW (ref 50–90)
Neutrophil, Synovial: 76 % — ABNORMAL HIGH (ref 0–25)
WBC, SYNOVIAL: 3100 /mm3 — AB (ref 0–200)

## 2017-01-10 LAB — CBC
HEMATOCRIT: 42.3 % (ref 39.0–52.0)
HEMOGLOBIN: 13.6 g/dL (ref 13.0–17.0)
MCH: 28.9 pg (ref 26.0–34.0)
MCHC: 32.2 g/dL (ref 30.0–36.0)
MCV: 89.8 fL (ref 78.0–100.0)
Platelets: 280 10*3/uL (ref 150–400)
RBC: 4.71 MIL/uL (ref 4.22–5.81)
RDW: 15 % (ref 11.5–15.5)
WBC: 8.7 10*3/uL (ref 4.0–10.5)

## 2017-01-10 LAB — COMPREHENSIVE METABOLIC PANEL
ALT: 30 U/L (ref 17–63)
ANION GAP: 9 (ref 5–15)
AST: 32 U/L (ref 15–41)
Albumin: 2.4 g/dL — ABNORMAL LOW (ref 3.5–5.0)
Alkaline Phosphatase: 69 U/L (ref 38–126)
BUN: 30 mg/dL — AB (ref 6–20)
CHLORIDE: 97 mmol/L — AB (ref 101–111)
CO2: 32 mmol/L (ref 22–32)
Calcium: 8.8 mg/dL — ABNORMAL LOW (ref 8.9–10.3)
Creatinine, Ser: 1.36 mg/dL — ABNORMAL HIGH (ref 0.61–1.24)
GFR calc Af Amer: 56 mL/min — ABNORMAL LOW (ref >60)
GFR calc non Af Amer: 49 mL/min — ABNORMAL LOW (ref >60)
Glucose, Bld: 114 mg/dL — ABNORMAL HIGH (ref 65–99)
POTASSIUM: 4.2 mmol/L (ref 3.5–5.1)
Sodium: 138 mmol/L (ref 135–145)
Total Bilirubin: 0.3 mg/dL (ref 0.3–1.2)
Total Protein: 5.5 g/dL — ABNORMAL LOW (ref 6.5–8.1)

## 2017-01-10 LAB — TSH: TSH: 1.091 u[IU]/mL (ref 0.350–4.500)

## 2017-01-10 LAB — MAGNESIUM: MAGNESIUM: 2.3 mg/dL (ref 1.7–2.4)

## 2017-01-10 MED ORDER — METOPROLOL TARTRATE 12.5 MG HALF TABLET
12.5000 mg | ORAL_TABLET | Freq: Every day | ORAL | Status: DC
  Administered 2017-01-11: 12.5 mg via ORAL
  Filled 2017-01-10 (×2): qty 1

## 2017-01-10 MED ORDER — DILTIAZEM HCL 25 MG/5ML IV SOLN
5.0000 mg | Freq: Once | INTRAVENOUS | Status: DC
  Filled 2017-01-10: qty 5

## 2017-01-10 MED ORDER — METOPROLOL TARTRATE 5 MG/5ML IV SOLN
INTRAVENOUS | Status: AC
  Filled 2017-01-10: qty 5

## 2017-01-10 MED ORDER — DILTIAZEM HCL 100 MG IV SOLR
5.0000 mg/h | INTRAVENOUS | Status: DC
  Administered 2017-01-10: 5 mg/h via INTRAVENOUS
  Filled 2017-01-10: qty 100

## 2017-01-10 MED ORDER — DILTIAZEM HCL 30 MG PO TABS
45.0000 mg | ORAL_TABLET | Freq: Three times a day (TID) | ORAL | Status: DC
  Administered 2017-01-10 – 2017-01-11 (×3): 45 mg via ORAL
  Filled 2017-01-10 (×3): qty 2

## 2017-01-10 MED ORDER — METOPROLOL TARTRATE 5 MG/5ML IV SOLN
2.5000 mg | Freq: Once | INTRAVENOUS | Status: AC
  Administered 2017-01-10: 2.5 mg via INTRAVENOUS

## 2017-01-10 MED ORDER — FUROSEMIDE 20 MG PO TABS
20.0000 mg | ORAL_TABLET | Freq: Every day | ORAL | Status: DC
  Administered 2017-01-12: 20 mg via ORAL
  Filled 2017-01-10: qty 1

## 2017-01-10 MED ORDER — ENOXAPARIN SODIUM 100 MG/ML ~~LOC~~ SOLN
1.0000 mg/kg | Freq: Two times a day (BID) | SUBCUTANEOUS | Status: DC
  Administered 2017-01-10 – 2017-01-11 (×3): 100 mg via SUBCUTANEOUS
  Filled 2017-01-10 (×3): qty 1
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
## 2017-01-10 NOTE — Care Management Important Message (Signed)
Important Message  Patient Details  Name: Mitchell [name] MRN: [id number] Date of Birth: 12-20-1940   Medicare Important Message Given:  Yes    [name] 01/10/2017, 2:00 PM

## 2017-01-10 NOTE — Clinical Social Work Note (Signed)
Requested documentation faxed to [place] [facility] for PAS[facility] review.  Charlynn Court, CSW [phone]

## 2017-01-10 NOTE — Plan of Care (Signed)
Problem: Tissue Perfusion: Goal: Risk factors for ineffective tissue perfusion will decrease Outcome: Progressing Patient uses oxygen as needed at home at baseline, oxygen saturation in the 90's on room air during this shift

## 2017-01-10 NOTE — Progress Notes (Signed)
RN in room, patient had removed his oxygen so nurse attached pulse ox to see what his oxygen level was off his oxygen.  Pulse ox 93-95% on room air, however heart rate in the 140's and 150's.  Apical pulse difficult for nurse to hear however radial pulse appears to correlate with this.  EKG obtained as patient non-tele, read as SVT heart rate in the 140's.  Patient denies chest pain, shortness of breath or other complaint.  MD notified.
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
## 2017-01-10 NOTE — Progress Notes (Signed)
ANTICOAGULATION CONSULT NOTE - Initial Consult  Pharmacy Consult for enoxaparin Indication: atrial fibrillation  Allergies  Allergen Reactions  . Lipitor [Atorvastatin] Other (See Comments)    "just paralyzed me; I can't take it"  . Statins Other (See Comments)    "paralyzed me; I can't take it"    Patient Measurements: Weight: 217 lb 6 oz (98.6 kg)  Vital Signs: Temp: 98 F (36.7 C) (09/27 0549) Temp Source: Oral (09/27 0549) BP: 111/73 (09/27 0950) Pulse Rate: 123 (09/27 0950)  Labs:  Recent Labs  01/08/17 0542  HGB 12.9*  HCT 40.6  PLT 209  CREATININE 1.27*    Estimated Creatinine Clearance: 59.3 mL/min (A) (by C-G formula based on SCr of 1.27 mg/dL (H)).   Medical History: Past Medical History:  Diagnosis Date  . Anginal pain (HCC) 01/2012  . Anxiety   . Atherosclerotic heart disease of native coronary artery without angina pectoris   . BPH (benign prostatic hypertrophy)   . CAD (coronary artery disease) 2007   with LAD/Diag CBPTCA and kissing   . CKD (chronic kidney disease), stage III    Hattie Perch 01/04/2017  . COPD (chronic obstructive pulmonary disease) (HCC)    "maybe" (02/14/2012)  . Coronary artery disease   . Depression   . GERD (gastroesophageal reflux disease)   . Hyperlipidemia   . Hypertension   . Hypoxemia   . Myocardial infarction (HCC) 01/22/2012   NSTEMI with DES to RSA (PDA)  . On home oxygen therapy   . Shortness of breath    "walking and laying down sometimes" (02/14/2012)    Medications:  Scheduled:  . aspirin EC  81 mg Oral Daily  . bupivacaine  10 mL Infiltration Once  . clopidogrel  75 mg Oral Daily  . colchicine  0.6 mg Oral BID  . enoxaparin (LOVENOX) injection  1 mg/kg Subcutaneous Q12H  . finasteride  5 mg Oral Daily  . furosemide  40 mg Oral Daily  . methylPREDNISolone acetate  80 mg Intra-articular Once  . methylPREDNISolone acetate  80 mg Intra-articular Once  . metoprolol tartrate      . metoprolol tartrate  12.5  mg Oral Daily  . polyethylene glycol  17 g Oral BID  . potassium chloride  40 mEq Oral BID  . senna  1 tablet Oral Daily  . tamsulosin  0.4 mg Oral QPC breakfast    Assessment: 76 yom found to have paroxysmal atrial flutter, started on diltiazem infusion. Pharmacy consulted to start enoxaparin. Scr similar to baseline, around 1.2. CBC stable. No anticoagulation prior to admission. No signs/symptoms of bleeding noted in chart.   Goal of Therapy:  Monitor platelets by anticoagulation protocol: Yes   Plan:  Lovenox 1 mg/kg every 12 hours Monitor CBC, renal function, and s/s of bleeding Follow for plans for outpatient anticoagulation therapy  Girard Cooter, PharmD Clinical Pharmacist  Phone: [phone] 01/10/2017,9:58 AM

## 2017-01-10 NOTE — Consult Note (Signed)
Cardiology Consultation:   Patient ID: Mitchell [name]; [id number]; 20-Aug-1940   Admit date: 01/04/2017 Date of Consult: 01/10/2017  Primary Care Provider: Daisy Floro, MD Primary Cardiologist: Dr. Katrinka Blazing   Patient Profile:   Mitchell [name] is a 76 y.o. male with a hx of CAD status post prior stenting to the LAD in 2007 and DES to the PDA in 2013, HTN, HL, COPD requiring oxygen, CKD stage III and alcohol abuse who is being seen today for the evaluation of CHF at the request of Dr. Mahala Menghini.   He was doing well on cardiac stand point when last seen by Dr. Katrinka Blazing 12/03/16 for cardiac clearance. Stress test is still pending.   History of Present Illness:   Mr. Lahaie admitted 01/04/17 for hypotension and dizziness secondary to over diuresis. He was taking lasix  BID and metolazone 2.5mg  3 times/week. He was also noted to be severe hypokalemic with K of 2. He was treated with IV fluids and supplemental potassium. Scr improved with hydration however developed LE edema. No DVT on doppler. Also had knee pain. Right knee x-rays showed lateral compartment osteoarthritis s/p aspiration of fluids which is consistent with gout. Treated with colchicine. On AM of 9/27 patient had acute onset shortness of breath. EKG showed afib/aflutter. He was not on telemetry. Converted to sinus rhythm on IV cardizem.   Echo on 01/07/17 showed Normal LV size with EF 55-60% and grade 1 DD. Normal RV size and systolic function. No significant valvular abnormalities.Repeat echo today is technically difficult study due to afib.   He denies alcohol drinking however his significant other on room states that patient drinks of a lot. Intermittent orthopnea and PND.   Past Medical History:  Diagnosis Date  . Anginal pain (HCC) 01/2012  . Anxiety   . Atherosclerotic heart disease of native coronary artery without angina pectoris   . BPH (benign prostatic hypertrophy)   . CAD (coronary artery disease) 2007   with LAD/Diag CBPTCA and kissing   . CKD (chronic kidney disease), stage III    Hattie Perch 01/04/2017  . COPD (chronic obstructive pulmonary disease) (HCC)    "maybe" (02/14/2012)  . Coronary artery disease   . Depression   . GERD (gastroesophageal reflux disease)   . Hyperlipidemia   . Hypertension   . Hypoxemia   . Myocardial infarction (HCC) 01/22/2012   NSTEMI with DES to RSA (PDA)  . On home oxygen therapy   . Shortness of breath    "walking and laying down sometimes" (02/14/2012)    Past Surgical History:  Procedure Laterality Date  . CATARACT EXTRACTION W/ INTRAOCULAR LENS  IMPLANT, BILATERAL  ~ 2010  . CORONARY ANGIOPLASTY WITH STENT PLACEMENT  05/11/2005   PCI OF LAD  . GANGLION CYST REMOVED  1956?   right  . LEFT HEART CATHETERIZATION WITH CORONARY ANGIOGRAM Bilateral 01/24/2012   Procedure: LEFT HEART CATHETERIZATION WITH CORONARY ANGIOGRAM;  Surgeon: Lesleigh Noe, MD;  Location: [facility] CATH LAB;  Service: Cardiovascular;  Laterality: Bilateral;  . REFRACTIVE SURGERY  2010   left     Inpatient Medications: Scheduled Meds: . aspirin EC  81 mg Oral Daily  . bupivacaine  10 mL Infiltration Once  . clopidogrel  75 mg Oral Daily  . colchicine  0.6 mg Oral BID  . enoxaparin (LOVENOX) injection  1 mg/kg Subcutaneous Q12H  . finasteride  5 mg Oral Daily  . furosemide  40 mg Oral Daily  . methylPREDNISolone acetate  80 mg  Intra-articular Once  . methylPREDNISolone acetate  80 mg Intra-articular Once  . metoprolol tartrate      . metoprolol tartrate  12.5 mg Oral Daily  . polyethylene glycol  17 g Oral BID  . potassium chloride  40 mEq Oral BID  . senna  1 tablet Oral Daily  . tamsulosin  0.4 mg Oral QPC breakfast   Continuous Infusions: . diltiazem (CARDIZEM) infusion 5 mg/hr (01/10/17 1126)   PRN Meds: acetaminophen **OR** acetaminophen, albuterol, diphenhydrAMINE, HYDROcodone-acetaminophen, promethazine  Allergies:    Allergies  Allergen Reactions  . Lipitor  [Atorvastatin] Other (See Comments)    "just paralyzed me; I can't take it"  . Statins Other (See Comments)    "paralyzed me; I can't take it"    Social History:   Social History   Social History  . Marital status: Single    Spouse name: N/A  . Number of children: 3  . Years of education: N/A   Occupational History  . Not on file.   Social History Main Topics  . Smoking status: Former Smoker    Years: 42.00    Types: Pipe    Quit date: 04/28/2011  . Smokeless tobacco: Never Used  . Alcohol use 3.5 oz/week    7 Standard drinks or equivalent per week     Comment: 02/14/2012 "weekends I have 6-7 mixed drinks"     occasional  . Drug use: No  . Sexual activity: Yes   Other Topics Concern  . Not on file   Social History Narrative   Truck driver          Family History:    Family History  Problem Relation Age of Onset  . Heart failure Mother   . Heart failure Brother      ROS:  Please see the history of present illness.  ROS All other ROS reviewed and negative.     Physical Exam/Data:   Vitals:   01/10/17 0806 01/10/17 0843 01/10/17 0950 01/10/17 1124  BP: 111/77 106/73 111/73 94/61  Pulse: (!) 149 (!) 101 (!) 123 85  Resp:      Temp:      TempSrc:      SpO2: 92%  97% 95%  Weight:        Intake/Output Summary (Last 24 hours) at 01/10/17 1318 Last data filed at 01/09/17 2040  Gross per 24 hour  Intake              240 ml  Output              400 ml  Net             -160 ml   Filed Weights   01/07/17 0559 01/10/17 0549  Weight: 219 lb 2.2 oz (99.4 kg) 217 lb 6 oz (98.6 kg)   Body mass index is 29.48 kg/m.  General:  Well nourished, well developed, in no acute distress HEENT: normal Lymph: no adenopathy Neck: JVD difficult to assess due to grith Endocrine:  No thryomegaly Vascular: No carotid bruits; FA pulses 2+ bilaterally without bruits  Cardiac:  normal S1, S2; RRR; no murmur  Lungs:  clear to auscultation bilaterally, no wheezing, rhonchi  or rales  Abd: soft, nontender, no hepatomegaly  Ext: 1+ LL edema Musculoskeletal:  No deformities, BUE and BLE strength normal and equal Skin: warm and dry  Neuro:  CNs 2-12 intact, no focal abnormalities noted Psych:  Normal affect   EKG:  The EKG was personally  reviewed and demonstrates:  Aflutter at rate of 149 bpm Telemetry:  Telemetry was personally reviewed and demonstrates: afib at 130, currently in sinus rhythm   Relevant CV Studies: As discussed above  Laboratory Data:  Chemistry Recent Labs Lab 01/07/17 0417 01/08/17 0542 01/10/17 1055  NA 139 138 138  K 4.2 4.4 4.2  CL 102 98* 97*  CO2 30 32 32  GLUCOSE 96 100* 114*  BUN 14 14 30*  CREATININE 1.18 1.27* 1.36*  CALCIUM 8.6* 8.7* 8.8*  GFRNONAA 58* 53* 49*  GFRAA >60 >60 56*  ANIONGAP Recent Labs Lab 01/04/17 0815 01/05/17 0025 01/10/17 1055  PROT 5.9* 4.5* 5.5*  ALBUMIN 3.3* 2.5* 2.4*  AST 25 18 32  ALT 17 15* 30  ALKPHOS 70 55 69  BILITOT 0.9 0.8 0.3   Hematology Recent Labs Lab 01/06/17 0619 01/08/17 0542 01/10/17 1055  WBC 9.9 12.0* 8.7  RBC 4.33 4.51 4.71  HGB 12.4* 12.9* 13.6  HCT 38.5* 40.6 42.3  MCV 88.9 90.0 89.8  MCH 28.6 28.6 28.9  MCHC 32.2 31.8 32.2  RDW 15.1 15.4 15.0  PLT 211 209 280   Cardiac Enzymes Recent Labs Lab 01/04/17 1154 01/04/17 1621 01/05/17 0025  TROPONINI <0.03 <0.03 <0.03    Recent Labs Lab 01/04/17 0802  TROPIPOC 0.02    Radiology/Studies:  Dg Knee 1-2 Views Right  Result Date: 01/09/2017 CLINICAL DATA:  Right knee pain. EXAM: RIGHT KNEE - 1-2 VIEW COMPARISON:  08/01/2016 FINDINGS: Moderate to severe narrowing of the lateral joint space compartment. Mild narrowing of the medial compartment. Small marginal osteophytes noted from the patella and lateral compartment. No fracture.  No bone lesion. No joint effusion. IMPRESSION: 1. No fracture or acute finding. 2. Arthropathic changes most prominently involving the lateral joint space  compartment. This may reflect osteoarthritis or possibly CPPD arthropathy. Findings are similar to the prior study. Electronically Signed   By: Amie Portland M.D.   On: 01/09/2017 21:47    Assessment and Plan:   1. Paroxysmal atrial flutter - Recorded this morning around 8am. Patient was not on telemetry prior to this. Converted to sinus rhythm on IV cardizem. Home atenolol held during admission due to hypotension. Echo with normal LVEF. His hypokalemia has been corrected.  - CHADSVASC score of 4 (age, HTN and vascular disease). Will need anticoagulation. Check TSH.   2. Acute on chronic diastolic CHF - Likely due to over hydration. He initially presented with over diuresis. He was taking lasix  BID and metolazone 2.5mg  3 times/week. Currently on lasix po  qd. Increasing BUN/Creatinine for the past two days. Hold diuretics tomorrow.   3. LE edema and pain - on Right side. S/p draining by ortho. Finding consistent with gout.   4. Hypokalemia - Resolved  5. Acute on CKD, stage III - Scr improved with hydration however gradually increasing for the past 2 days.    For questions or updates, please contact [facility] Please consult [email or web address] for contact info under Cardiology/STEMI.   Lorelei Pont, PA  01/10/2017 1:18 PM

## 2017-01-10 NOTE — Progress Notes (Signed)
Patient stable during 7 a to 7 p shift, in SVT at beginning of shift as per previous note but converted back to SR and cardizem drip stopped after po Cardizem given.  Patient sat up in chair for most of shift.  Patient irritable at times, complaining about staff, his ex wife, his children.  Ex-wife at bedside for short visit during this shift.  Patient off oxygen entire shift once heart rate came back down, says he wears if on and off at home, sometimes goes days without using it.

## 2017-01-10 NOTE — Progress Notes (Signed)
  Echocardiogram 2D Echocardiogram has been performed.  Mitchell [name] 01/10/2017, 11:00 AM

## 2017-01-10 NOTE — Consult Note (Signed)
Reason for Consult:right knee pain and swelling Referring Physician: Dr. Halina Andreas Mitchell [name] is an 76 y.o. male.  HPI: Mitchell [name] is a 76 year old male who is in the hospital with some Cardiac issues.  He had onset of Right knee swelling, tightness, and pain without fever.  He also has some swelling in his lower extremity. He had a venous Doppler which was negative for DVT.  He was started on oral colchicine which improved his symptoms moderately.  We will consult to because of continued  Swelling pain, and difficulty weightbearing on his right leg.  Past Medical History:  Diagnosis Date  . Anginal pain ([place]) 01/2012  . Anxiety   . Atherosclerotic heart disease of native coronary artery without angina pectoris   . BPH (benign prostatic hypertrophy)   . CAD (coronary artery disease) 2007   with LAD/Diag CBPTCA and kissing   . CKD (chronic kidney disease), stage III    Mitchell [name] 01/04/2017  . COPD (chronic obstructive pulmonary disease) ([place])    "maybe" (02/14/2012)  . Coronary artery disease   . Depression   . GERD (gastroesophageal reflux disease)   . Hyperlipidemia   . Hypertension   . Hypoxemia   . Myocardial infarction ([place]) 01/22/2012   NSTEMI with DES to RSA (PDA)  . On home oxygen therapy   . Shortness of breath    "walking and laying down sometimes" (02/14/2012)    Past Surgical History:  Procedure Laterality Date  . CATARACT EXTRACTION W/ INTRAOCULAR LENS  IMPLANT, BILATERAL  ~ 2010  . CORONARY ANGIOPLASTY WITH STENT PLACEMENT  05/11/2005   PCI OF LAD  . GANGLION CYST REMOVED  1956?   right  . LEFT HEART CATHETERIZATION WITH CORONARY ANGIOGRAM Bilateral 01/24/2012   Procedure: LEFT HEART CATHETERIZATION WITH CORONARY ANGIOGRAM;  Surgeon: Sinclair Grooms, MD;  Location[facility] CATH LAB;  Service: Cardiovascular;  Laterality: Bilateral;  . REFRACTIVE SURGERY  2010   left    Family History  Problem Relation Age of Onset  . Heart failure Mother   . Heart failure  Brother     Social History:  reports that he quit smoking about 5 years ago. His smoking use included Pipe. He quit after 42.00 years of use. He has never used smokeless tobacco. He reports that he drinks about 3.5 oz of alcohol per week . He reports that he does not use drugs.  Allergies:  Allergies  Allergen Reactions  . Lipitor [Atorvastatin] Other (See Comments)    "just paralyzed me; I can't take it"  . Statins Other (See Comments)    "paralyzed me; I can't take it"    Medications: I have reviewed the patient's current medications.  Results for orders placed or performed during the hospital encounter of 01/04/17 (from the past 48 hour(s))  Synovial cell count + diff, w/ crystals     Status: Abnormal   Collection Time: 01/10/17  9:33 AM  Result Value Ref Range   Color, Synovial YELLOW YELLOW   Appearance-Synovial CLOUDY (A) CLEAR   Crystals, Fluid INTRACELLULAR MONOSODIUM URATE CRYSTALS     Comment: EXTRACELLULAR MONOSODIUM URATE CRYSTALS   WBC, Synovial 3,100 (H) 0 - 200 /cu mm   Neutrophil, Synovial 76 (H) 0 - 25 %   Lymphocytes-Synovial Fld 1 0 - 20 %   Monocyte-Macrophage-Synovial Fluid 23 (L) 50 - 90 %   Eosinophils-Synovial 0 0 - 1 %   Other Cells-SYN PYKNOTIC NEUTROPHILS NOTED.   Magnesium  Status: None   Collection Time: 01/10/17 10:55 AM  Result Value Ref Range   Magnesium 2.3 1.7 - 2.4 mg/dL  Comprehensive metabolic panel     Status: Abnormal   Collection Time: 01/10/17 10:55 AM  Result Value Ref Range   Sodium 138 135 - 145 mmol/L   Potassium 4.2 3.5 - 5.1 mmol/L   Chloride 97 (L) 101 - 111 mmol/L   CO2 32 22 - 32 mmol/L   Glucose, Bld 114 (H) 65 - 99 mg/dL   BUN 30 (H) 6 - 20 mg/dL   Creatinine, Ser 1.36 (H) 0.61 - 1.24 mg/dL   Calcium 8.8 (L) 8.9 - 10.3 mg/dL   Total Protein 5.5 (L) 6.5 - 8.1 g/dL   Albumin 2.4 (L) 3.5 - 5.0 g/dL   AST 32 15 - 41 U/L   ALT 30 17 - 63 U/L   Alkaline Phosphatase 69 38 - 126 U/L   Total Bilirubin 0.3 0.3 - 1.2 mg/dL    GFR calc non Af Amer 49 (L) >60 mL/min   GFR calc Af Amer 56 (L) >60 mL/min    Comment: (NOTE) The eGFR has been calculated using the CKD EPI equation. This calculation has not been validated in all clinical situations. eGFR's persistently <60 mL/min signify possible Chronic Kidney Disease.    Anion gap 9 5 - 15  CBC     Status: None   Collection Time: 01/10/17 10:55 AM  Result Value Ref Range   WBC 8.7 4.0 - 10.5 K/uL   RBC 4.71 4.22 - 5.81 MIL/uL   Hemoglobin 13.6 13.0 - 17.0 g/dL   HCT 42.3 39.0 - 52.0 %   MCV 89.8 78.0 - 100.0 fL   MCH 28.9 26.0 - 34.0 pg   MCHC 32.2 30.0 - 36.0 g/dL   RDW 15.0 11.5 - 15.5 %   Platelets 280 150 - 400 K/uL    Dg Knee 1-2 Views Right  Result Date: 01/09/2017 CLINICAL DATA:  Right knee pain. EXAM: RIGHT KNEE - 1-2 VIEW COMPARISON:  08/01/2016 FINDINGS: Moderate to severe narrowing of the lateral joint space compartment. Mild narrowing of the medial compartment. Small marginal osteophytes noted from the patella and lateral compartment. No fracture.  No bone lesion. No joint effusion. IMPRESSION: 1. No fracture or acute finding. 2. Arthropathic changes most prominently involving the lateral joint space compartment. This may reflect osteoarthritis or possibly CPPD arthropathy. Findings are similar to the prior study. Electronically Signed   By: Lajean Manes M.D.   On: 01/09/2017 21:47  Results for EVERITT, WENNER (MRN [id number]) as of 01/10/2017 12:38  Ref. Range 01/10/2017 09:33  Color, Synovial Latest Ref Range: YELLOW  YELLOW  Appearance-Synovial Latest Ref Range: CLEAR  CLOUDY (A)  Crystals, Fluid Unknown INTRACELLULAR MON...  WBC, Synovial Latest Ref Range: 0 - 200 /cu mm 3,100 (H)  Neutrophil, Synovial Latest Ref Range: 0 - 25 % 76 (H)  Lymphocytes-Synovial Fld Latest Ref Range: 0 - 20 % 1  Monocyte-Macrophage-Synovial Fluid Latest Ref Range: 50 - 90 % 23 (L)  Eosinophils-Synovial Latest Ref Range: 0 - 1 % 0  Other Cells-SYN Unknown  PYKNOTIC NEUTROPH...    ROS Blood pressure 94/61, pulse 85, temperature 98 F (36.7 C), temperature source Oral, resp. rate 18, weight 98.6 kg (217 lb 6 oz), SpO2 95 %. Physical Exam Right knee exam/lower extremity exam: He has a +1 effusion.  He is able to do a straight leg raise.  Has no laxity to varus or  valgus stress. His calf is soft and nontender.  He does have diffuse swelling of his lower leg including his foot.  No sign of cellulitis.  No significant knee warmth. Assessment/Plan: 1.  Effusion right knee. 2. Osteoarthritis right knee. 3.  Gouty arthritis, right knee. Plan:  I reviewed x-rays of the right knee and these show a lateral compartment narrowing, essentially bone-on-bone consistent with osteoarthritis.He has gotten some improvement with oral colchicine, but has a fairly swollen right knee with pain.Today, we will aspirate and inject his right knee with cortisone. Procedure: Under sterile conditions after painting the lateral suprapatellar region with Betadine I aspirated 25 cc of yellow tinted nonpurulent synovial fluid from the right knee..  This fluid was sent to the lab for synovial fluid analysis and crystals.  It did show Extracellular monosodium urate crystals Consistent with gout.Marland KitchenMarland KitchenI then instilled through the same needle a mixture of 2 cc of 80 mg/mL Depo-Medrol and 8 cc of 0.5% plain Marcaine.ihad the patient work his knee back and forth to spread out the medication in the right knee joint. I will order a knee-high  TED hose for edema control.  He may weight-bear as tolerated on the right. We will have the patient follow up with [place] in 10 days in the office.Thank you for this consult.  This case was reviewed in its entirety by Dr. Berenice Primas In the evaluation/treatment plan was formulated by him. Agree with above findings and plan. [place] G 01/10/2017, 12:41 PM

## 2017-01-10 NOTE — Progress Notes (Signed)
Physical Therapy Treatment Patient Details Name: Mitchell [name] MRN: [id number] DOB: 07/19/1940 Today's Date: 01/10/2017    History of Present Illness 76 year old male with CAD status post MI, status post stent to LAD in 2007, HTN, dyslipidemia, COPD on home oxygen, CKD stage 3 with baseline Cr of 1.2, leagally blind who presented to ED with right arm pain, dizziness and some headaches/nausea, dx with hypotension 2' to dehydration, LE edema, hypokalemia, and CKD stage III.   PT with run of SVT this am.  HR 150 on EKG.  Pt had fluid drawn out of knee this am as well.    PT Comments    Patient tolerated short distance gait with RW and mod A +2. Pt limited by LE pain (R > L) and limited ROM and edema of R LE. Current plan remains appropriate.    Follow Up Recommendations  SNF     Equipment Recommendations   (to be determined at next venue)    Recommendations for Other Services OT consult     Precautions / Restrictions Precautions Precautions: Fall Restrictions Weight Bearing Restrictions: No    Mobility  Bed Mobility               General bed mobility comments: pt OOB in chair upon arrival  Transfers Overall transfer level: Needs assistance Equipment used: Rolling walker (2 wheeled) Transfers: Sit to/from Stand Sit to Stand: Mod assist;+2 physical assistance         General transfer comment: cues for hand placement and technique; assist to power up into standing and for balance upon stand  Ambulation/Gait Ambulation/Gait assistance: Mod assist;+2 safety/equipment Ambulation Distance (Feet): 8 Feet Assistive device: Rolling walker (2 wheeled) Gait Pattern/deviations: Step-to pattern;Decreased step length - left;Decreased stance time - right;Antalgic;Trunk flexed;Decreased dorsiflexion - right;Decreased dorsiflexion - left     General Gait Details: multimodal cues for posture and vc for proximity of RW; assist for balance and weight shifting; R knee begins to  buckle slightly when fatigued and pt has limited ROM of R knee with it maintained in slight flexion    Stairs            Wheelchair Mobility    Modified Rankin (Stroke Patients Only)       Balance Overall balance assessment: Needs assistance Sitting-balance support: Feet supported;No upper extremity supported Sitting balance-Leahy Scale: Fair     Standing balance support: Bilateral upper extremity supported Standing balance-Leahy Scale: Poor                              Cognition Arousal/Alertness: Awake/alert Behavior During Therapy: Agitated Overall Cognitive Status: Within Functional Limits for tasks assessed                                 General Comments: Pt self limits and gets very frustrated and belligerant if you encourage more than he wants to do.      Exercises General Exercises - Lower Extremity Ankle Circles/Pumps: AROM;Both;20 reps Long Arc Quad: AROM;Seated;Both;5 reps Hip Flexion/Marching: AROM;Seated;Both;5 reps    General Comments General comments (skin integrity, edema, etc.): R LE edema      Pertinent Vitals/Pain Pain Assessment: Faces Faces Pain Scale: Hurts even more Pain Location: bilat LE (R > L) Pain Descriptors / Indicators: Grimacing;Guarding;Aching;Sore Pain Intervention(s): Limited activity within patient's tolerance;Monitored during session;Premedicated before session;Repositioned    Home Living  Prior Function            PT Goals (current goals can now be found in the care plan section) Acute Rehab PT Goals PT Goal Formulation: With patient Time For Goal Achievement: 01/22/17 Potential to Achieve Goals: Fair Progress towards PT goals: Progressing toward goals    Frequency    Min 2X/week      PT Plan Current plan remains appropriate    Co-evaluation              AM-PAC PT "6 Clicks" Daily Activity  Outcome Measure  Difficulty turning over in bed  (including adjusting bedclothes, sheets and blankets)?: A Lot Difficulty moving from lying on back to sitting on the side of the bed? : Unable Difficulty sitting down on and standing up from a chair with arms (e.g., wheelchair, bedside commode, etc,.)?: Unable Help needed moving to and from a bed to chair (including a wheelchair)?: A Lot Help needed walking in hospital room?: A Lot Help needed climbing 3-5 steps with a railing? : Total 6 Click Score: 9    End of Session Equipment Utilized During Treatment: Gait belt Activity Tolerance: Patient limited by pain Patient left: with call bell/phone within reach;in chair Nurse Communication: Mobility status PT Visit Diagnosis: Other abnormalities of gait and mobility (R26.89);Muscle weakness (generalized) (M62.81);Difficulty in walking, not elsewhere classified (R26.2);Pain Pain - Right/Left:  (bilateral ) Pain - part of body: Ankle and joints of foot;Leg;Knee     Time: 9604-5409 PT Time Calculation (min) (ACUTE ONLY): 40 min  Charges:  $Gait Training: 8-22 mins $Therapeutic Exercise: 8-22 mins $Therapeutic Activity: 8-22 mins                    G Codes:       Erline Levine, PTA Pager: [phone]     Carolynne Edouard 01/10/2017, 4:51 PM

## 2017-01-10 NOTE — Progress Notes (Signed)
Brief note: The patient's right knee x-rays showed lateral compartment osteoarthritis.  No acute changes.  Today under sterile conditions I aspirated 25 cc of slightly greenish,clear nonpurulent synovial fluid from the right knee without difficulty.  A mix of Depo-Medrol and Marcaine was injected into the right knee joint. I will send the aspirate to the lab for synovial fluid analysis,cell count, and crystals. Full consult note to follow.

## 2017-01-11 LAB — RENAL FUNCTION PANEL
ALBUMIN: 2.4 g/dL — AB (ref 3.5–5.0)
ANION GAP: 5 (ref 5–15)
BUN: 30 mg/dL — ABNORMAL HIGH (ref 6–20)
CO2: 30 mmol/L (ref 22–32)
Calcium: 8.7 mg/dL — ABNORMAL LOW (ref 8.9–10.3)
Chloride: 102 mmol/L (ref 101–111)
Creatinine, Ser: 1.33 mg/dL — ABNORMAL HIGH (ref 0.61–1.24)
GFR calc Af Amer: 58 mL/min — ABNORMAL LOW (ref >60)
GFR calc non Af Amer: 50 mL/min — ABNORMAL LOW (ref >60)
GLUCOSE: 120 mg/dL — AB (ref 65–99)
PHOSPHORUS: 3.7 mg/dL (ref 2.5–4.6)
POTASSIUM: 5.1 mmol/L (ref 3.5–5.1)
Sodium: 137 mmol/L (ref 135–145)

## 2017-01-11 MED ORDER — APIXABAN 5 MG PO TABS: 5.0000 mg | ORAL_TABLET | Freq: Two times a day (BID) | ORAL | 0 refills | Status: DC

## 2017-01-11 MED ORDER — METOPROLOL TARTRATE 25 MG PO TABS: 25.0000 mg | ORAL_TABLET | Freq: Two times a day (BID) | ORAL | 0 refills | Status: DC

## 2017-01-11 MED ORDER — METOPROLOL TARTRATE 25 MG PO TABS
25.0000 mg | ORAL_TABLET | Freq: Two times a day (BID) | ORAL | Status: DC
  Administered 2017-01-11 – 2017-01-12 (×2): 25 mg via ORAL
  Filled 2017-01-11 (×2): qty 1

## 2017-01-11 MED ORDER — POTASSIUM CHLORIDE CRYS ER 20 MEQ PO TBCR
40.0000 meq | EXTENDED_RELEASE_TABLET | Freq: Every day | ORAL | Status: DC
  Administered 2017-01-12: 40 meq via ORAL
  Filled 2017-01-11: qty 2

## 2017-01-11 MED ORDER — COLCHICINE 0.6 MG PO TABS: 0.6000 mg | ORAL_TABLET | Freq: Two times a day (BID) | ORAL | 0 refills | Status: DC

## 2017-01-11 MED ORDER — FUROSEMIDE 20 MG PO TABS: 20.0000 mg | ORAL_TABLET | Freq: Every day | ORAL | 0 refills | Status: DC

## 2017-01-11 MED ORDER — APIXABAN 5 MG PO TABS
5.0000 mg | ORAL_TABLET | Freq: Two times a day (BID) | ORAL | Status: DC
  Administered 2017-01-11 – 2017-01-12 (×2): 5 mg via ORAL
  Filled 2017-01-11 (×2): qty 1

## 2017-01-11 NOTE — Progress Notes (Signed)
ANTICOAGULATION CONSULT NOTE - Initial Consult  Pharmacy Consult for apixaban Indication: atrial fibrillation  Allergies  Allergen Reactions  . Lipitor [Atorvastatin] Other (See Comments)    "just paralyzed me; I can't take it"  . Statins Other (See Comments)    "paralyzed me; I can't take it"    Patient Measurements: Height: 6' (182.9 cm) Weight: 208 lb 12.8 oz (94.7 kg) IBW/kg (Calculated) : 77.6  Vital Signs: Temp: 98.6 F (37 C) (09/28 0935) Temp Source: Oral (09/28 0935) BP: 104/73 (09/28 0935) Pulse Rate: 94 (09/28 0935)  Labs:  Recent Labs  01/10/17 1055 01/11/17 0543  HGB 13.6  --   HCT 42.3  --   PLT 280  --   CREATININE 1.36* 1.33*    Estimated Creatinine Clearance: 55.5 mL/min (A) (by C-G formula based on SCr of 1.33 mg/dL (H)).   Medical History: Past Medical History:  Diagnosis Date  . Anginal pain (HCC) 01/2012  . Anxiety   . Atherosclerotic heart disease of native coronary artery without angina pectoris   . BPH (benign prostatic hypertrophy)   . CAD (coronary artery disease) 2007   with LAD/Diag CBPTCA and kissing   . CKD (chronic kidney disease), stage III    Hattie Perch 01/04/2017  . COPD (chronic obstructive pulmonary disease) (HCC)    "maybe" (02/14/2012)  . Coronary artery disease   . Depression   . GERD (gastroesophageal reflux disease)   . Hyperlipidemia   . Hypertension   . Hypoxemia   . Myocardial infarction (HCC) 01/22/2012   NSTEMI with DES to RSA (PDA)  . On home oxygen therapy   . Shortness of breath    "walking and laying down sometimes" (02/14/2012)    Medications:  Scheduled:  . apixaban  5 mg Oral BID  . aspirin EC  81 mg Oral Daily  . colchicine  0.6 mg Oral BID  . finasteride  5 mg Oral Daily  . [START ON 01/12/2017] furosemide  20 mg Oral Daily  . methylPREDNISolone acetate  80 mg Intra-articular Once  . methylPREDNISolone acetate  80 mg Intra-articular Once  . metoprolol tartrate  25 mg Oral BID  . polyethylene  glycol  17 g Oral BID  . [START ON 01/12/2017] potassium chloride  40 mEq Oral Daily  . senna  1 tablet Oral Daily  . tamsulosin  0.4 mg Oral QPC breakfast    Assessment: 76 yom found to have paroxysmal atrial flutter, started on diltiazem infusion. Was started on enoxaparin 1 mg/kg every 12 hours yesterday. Last dose of enoxaparin was on 9/28 at 0944.  Scr similar to baseline, around 1.33. CBC stable. No anticoagulation prior to admission. No signs/symptoms of bleeding noted in chart. Plan to transition to apixaban today- will schedule at time that next enoxaparin dose was due.   Goal of Therapy:  Monitor platelets by anticoagulation protocol: Yes   Plan:  Apixaban 5 mg twice daily starting tonight at 2200 Monitor CBC, renal function, and s/s of bleeding  Girard Cooter, PharmD Clinical Pharmacist  Phone: [phone] 01/11/2017,1:51 PM

## 2017-01-11 NOTE — Clinical Social Work Note (Signed)
RNCM and CSW met with patient. He is undecided at this time regarding SNF vs. HHPT and wants to wait until he's closer to discharge to decide.  Dayton Scrape, [place]

## 2017-01-11 NOTE — Clinical Social Work Note (Signed)
Patient discharging home today.  CSW signing off.  Mayson Mcneish, CSW [phone]  

## 2017-01-11 NOTE — Progress Notes (Signed)
Progress Note  Patient Name: Taden Witter Date of Encounter: 01/11/2017  Primary Cardiologist:  Dr. Katrinka Blazing  Subjective   Feeling well.  Tired and short of breath with exertion.  Getting up and dressed tired him out.    Inpatient Medications    Scheduled Meds: . aspirin EC  81 mg Oral Daily  . clopidogrel  75 mg Oral Daily  . colchicine  0.6 mg Oral BID  . diltiazem  45 mg Oral Q8H  . enoxaparin (LOVENOX) injection  1 mg/kg Subcutaneous Q12H  . finasteride  5 mg Oral Daily  . [START ON 01/12/2017] furosemide  20 mg Oral Daily  . methylPREDNISolone acetate  80 mg Intra-articular Once  . methylPREDNISolone acetate  80 mg Intra-articular Once  . metoprolol tartrate  12.5 mg Oral Daily  . polyethylene glycol  17 g Oral BID  . [START ON 01/12/2017] potassium chloride  40 mEq Oral Daily  . senna  1 tablet Oral Daily  . tamsulosin  0.4 mg Oral QPC breakfast   Continuous Infusions:  PRN Meds: acetaminophen **OR** acetaminophen, albuterol, diphenhydrAMINE, HYDROcodone-acetaminophen, promethazine   Vital Signs    Vitals:   01/10/17 1605 01/10/17 2100 01/11/17 0520 01/11/17 0935  BP: 111/64 132/68 137/73 104/73  Pulse: 77 69 69 94  Resp:  20 20   Temp:  98.2 F (36.8 C) 97.9 F (36.6 C) 98.6 F (37 C)  TempSrc:  Oral Oral Oral  SpO2: 96% 96% 93% 93%  Weight:   94.7 kg (208 lb 12.8 oz)   Height:   6' (1.829 m)     Intake/Output Summary (Last 24 hours) at 01/11/17 1233 Last data filed at 01/11/17 0653  Gross per 24 hour  Intake              960 ml  Output             1200 ml  Net             -240 ml   Filed Weights   01/10/17 0549 01/11/17 0520  Weight: 98.6 kg (217 lb 6 oz) 94.7 kg (208 lb 12.8 oz)    Telemetry     - Personally Reviewed  ECG    n/a - Personally Reviewed  Physical Exam   GEN: Well-appearing. No acute distress.   Neck: No JVD Cardiac: RRR, no murmurs, rubs, or gallops.  Respiratory: Clear to auscultation bilaterally. GI: Soft,  nontender, non-distended  MS: No edema; No deformity. Neuro:  Nonfocal  Psych: Normal affect   Labs    Chemistry Recent Labs Lab 01/05/17 0025  01/08/17 0542 01/10/17 1055 01/11/17 0543  NA 137  < > 138 138 137  K 2.5*  < > 4.4 4.2 5.1  CL 96*  < > 98* 97* 102  CO2 31  < > 32 32 30  GLUCOSE 120*  < > 100* 114* 120*  BUN 48*  < > 14 30* 30*  CREATININE 2.09*  < > 1.27* 1.36* 1.33*  CALCIUM 7.5*  < > 8.7* 8.8* 8.7*  PROT 4.5*  --   --  5.5*  --   ALBUMIN 2.5*  --   --  2.4* 2.4*  AST 18  --   --  32  --   ALT 15*  --   --  30  --   ALKPHOS 55  --   --  69  --   BILITOT 0.8  --   --  0.3  --  GFRNONAA 29*  < > 53* 49* 50*  GFRAA 33*  < > >60 56* 58*  ANIONGAP 10  < > < > = values in this interval not displayed.   Hematology Recent Labs Lab 01/06/17 0619 01/08/17 0542 01/10/17 1055  WBC 9.9 12.0* 8.7  RBC 4.33 4.51 4.71  HGB 12.4* 12.9* 13.6  HCT 38.5* 40.6 42.3  MCV 88.9 90.0 89.8  MCH 28.6 28.6 28.9  MCHC 32.2 31.8 32.2  RDW 15.1 15.4 15.0  PLT 211 209 280    Cardiac Enzymes Recent Labs Lab 01/04/17 1621 01/05/17 0025  TROPONINI <0.03 <0.03   No results for input(s): TROPIPOC in the last 168 hours.   BNPNo results for input(s): BNP, PROBNP in the last 168 hours.   DDimer No results for input(s): DDIMER in the last 168 hours.   Radiology    Dg Knee 1-2 Views Right  Result Date: 01/09/2017 CLINICAL DATA:  Right knee pain. EXAM: RIGHT KNEE - 1-2 VIEW COMPARISON:  08/01/2016 FINDINGS: Moderate to severe narrowing of the lateral joint space compartment. Mild narrowing of the medial compartment. Small marginal osteophytes noted from the patella and lateral compartment. No fracture.  No bone lesion. No joint effusion. IMPRESSION: 1. No fracture or acute finding. 2. Arthropathic changes most prominently involving the lateral joint space compartment. This may reflect osteoarthritis or possibly CPPD arthropathy. Findings are similar to the prior study.  Electronically Signed   By: Amie Portland M.D.   On: 01/09/2017 21:47    Cardiac Studies   Echo 01/07/17: Study Conclusions  - Left ventricle: The cavity size was normal. Wall thickness was   normal. Systolic function was normal. The estimated ejection   fraction was in the range of 55% to 60%. Wall motion was normal;   there were no regional wall motion abnormalities. Doppler   parameters are consistent with abnormal left ventricular   relaxation (grade 1 diastolic dysfunction). - Aortic valve: Poorly visualized. There was no stenosis. - Mitral valve: There was no significant regurgitation. - Right ventricle: The cavity size was normal. Systolic function   was normal. - Pulmonary arteries: No complete TR doppler jet so unable to   estimate PA systolic pressure. - Inferior vena cava: The vessel was normal in size. The   respirophasic diameter changes were in the normal range (>= 50%),   consistent with normal central venous pressure.  Impressions:  - Normal LV size with EF 55-60%. Normal RV size and systolic   function. No significant valvular abnormalities.  Patient Profile     Mr. Hackler is a 34M with CAD s/p PCI (LAD 2007, PDA 2013), hypertension, hyperlipidemia, CKD III, and COPD here with acute on chronic renal failure in the setting of over-diuresis. He developed paroxysmal atrial fibrillation/flutter   Assessment & Plan    # Paroxysmal atrial flutter: Mr. Bordas had a recurrent episode this AM that lasted one hour.  Given his CAD, we will stop diltiazem and increase metoprolol to  bid.  Stop clopidogrel and start Eliquis  bid.  Continue aspirin.   # Chronic diastolic heart failure: Mr. Piccione initially presented over-diuresed then became volume overloaded and then renal function again started to climb with diuresis.  He appears euvolemic today.  Renal function slightly improved.  Weight is down to 94.7kg from 98.6kg 9/27.  This seem inaccurate given that he was  only reportedly net 200 mL yesterday.  Holding furosemide today and will start  tomorrow.  # CAD:  Stable. Last PCI 2013.  Stopping clopidogrel as above. He has been getting metoprolol tartrate once daily.  This isn't long-acting.  We will switch this to  bid.  Continue aspirin.  He has statin allergies.  Will check lipids.  He may be a candidate for PCSK9 inhibitor as an outpatient. LDL was 84 10/2016.  For questions or updates, please contact [facility] Please consult [email or web address] for contact info under Cardiology/STEMI.      Signed, Chilton Si, MD  01/11/2017, 12:33 PM

## 2017-01-11 NOTE — Progress Notes (Addendum)
CM talked to patient again about DCP; he wants to walk with his nurse and then decide SNF vs HHC; CM will follow up; B Shelba Flake [phone]  3:28 pm- Patient has decided to return home at discharge; [facility] choice offered, pt chose [facility]; also rollator (rolling walker with seat) and shower stool. Mary with [facility] called for arrangements; Alexis Goodell [phone]

## 2017-01-11 NOTE — Progress Notes (Signed)
Patients right knee is feeling much better, patient able  To stand and walk with one assist with his walker around the room.

## 2017-01-11 NOTE — Discharge Instructions (Signed)
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
## 2017-01-11 NOTE — Progress Notes (Addendum)
I have seen this patient today and pertinent changes noted by cardiology team It is my feeling that he is nearing discharge and I will prepare a discharge summary for discharge home 01/12/17  Blood pressure 104/73, pulse 94, temperature 98.6 F (37 C), temperature source Oral, resp. rate 20, height 6' (1.829 m), weight 94.7 kg (208 lb 12.8 oz), SpO2 93 %.  On exam he is pleasant alert oriented in much less pain in hisright knee It is less swollen and painful His toes and his legs are not swollen at all Chest is clear S1-S2 no murmur rub or gallop Abdomen soft nontender no rebound Neurologically seems intact   P Main changes today are Elliquis was started, Plavix was stopped, Lasix will be started tomorrow 20 mg On discharge she will need colchicine and follow-up with orthopedics as an outpatient to discuss timing of further knee interventions He will also need a desats screen to determine if he qualifies long-term for oxygen We will order rolling walker with 2 wheels on discharge Please see my discharge summary dated 01/12/2017--hospitalist on call tomorrow will update as needed  Pleas Koch, MD [facility] ([phone]

## 2017-01-11 NOTE — Progress Notes (Signed)
Hr up to 130s non sustained beat blocker given

## 2017-01-12 LAB — LIPID PANEL
Cholesterol: 168 mg/dL (ref 0–200)
HDL: 34 mg/dL — ABNORMAL LOW (ref >40)
LDL CALC: 113 mg/dL — AB (ref 0–99)
Total CHOL/HDL Ratio: 4.9 RATIO
Triglycerides: 104 mg/dL (ref <150)
VLDL: 21 mg/dL (ref 0–40)

## 2017-01-12 NOTE — Care Management Note (Signed)
Case Management Note  Patient Details  Name: Mitchell [name] MRN: [id number] Date of Birth: [birth year]  Subjective/Objective:   Patient for dc today, patient was previously set up by previous NCM, this NCM notified Dona with [facility] of the discharge today, he has 3 n 1 in his room and a rollator, he states he will get the shower stool from [place].  Also this patient is on Eliquis, this NCM gave patient the 30 day free coupon for the Eliquisx.                  Action/Plan: DC home with HHRN, PT, aide and social worker, rollator and 3 n 1 and Eliquis  30 day coupon.  Expected Discharge Date:  01/12/17               Expected Discharge Plan:  Home w Home Health Services  In-House Referral:     Discharge planning Services  CM Consult  Post Acute Care Choice:  Home Health Choice offered to:  Patient  DME Arranged:  Walker rolling with seat, 3-N-1 DME Agency:  [facility]  HH Arranged:  RN, PT, Nurse's Aide, Social Work [employer] Agency:  [facility] [employer] [facility] [employer]  Status of Service:  Completed, signed off  If discussed at [employer], dates discussed:    Additional Comments:  Leone Haven, RN 01/12/2017, 9:04 AM

## 2017-01-12 NOTE — Progress Notes (Signed)
Discharge instructions reviewed with patient, son and ex-wife.  Went over all medications in detail including what medications to get rid of and what to take.  Went over all times and when next dose is due, patient, son and spouse all verbalized understanding of instructions.  Patient transported via wheelchair to front of hospital to be taken home by son.  Rollator and BSC sent with patient.

## 2017-01-15 DIAGNOSIS — N183 Chronic kidney disease, stage 3 (moderate): Secondary | ICD-10-CM | POA: Diagnosis not present

## 2017-01-15 DIAGNOSIS — I484 Atypical atrial flutter: Secondary | ICD-10-CM | POA: Diagnosis not present

## 2017-01-15 DIAGNOSIS — I130 Hypertensive heart and chronic kidney disease with heart failure and stage 1 through stage 4 chronic kidney disease, or unspecified chronic kidney disease: Secondary | ICD-10-CM | POA: Diagnosis not present

## 2017-01-15 DIAGNOSIS — I25119 Atherosclerotic heart disease of native coronary artery with unspecified angina pectoris: Secondary | ICD-10-CM | POA: Diagnosis not present

## 2017-01-15 DIAGNOSIS — J449 Chronic obstructive pulmonary disease, unspecified: Secondary | ICD-10-CM | POA: Diagnosis not present

## 2017-01-15 DIAGNOSIS — I5032 Chronic diastolic (congestive) heart failure: Secondary | ICD-10-CM | POA: Diagnosis not present

## 2017-01-17 DIAGNOSIS — Z23 Encounter for immunization: Secondary | ICD-10-CM | POA: Diagnosis not present

## 2017-01-17 DIAGNOSIS — I2510 Atherosclerotic heart disease of native coronary artery without angina pectoris: Secondary | ICD-10-CM | POA: Diagnosis not present

## 2017-01-17 DIAGNOSIS — M109 Gout, unspecified: Secondary | ICD-10-CM | POA: Diagnosis not present

## 2017-01-17 DIAGNOSIS — E876 Hypokalemia: Secondary | ICD-10-CM | POA: Diagnosis not present

## 2017-01-17 DIAGNOSIS — J449 Chronic obstructive pulmonary disease, unspecified: Secondary | ICD-10-CM | POA: Diagnosis not present

## 2017-01-18 ENCOUNTER — Telehealth: Payer: Self-pay | Admitting: Interventional Cardiology

## 2017-01-18 ENCOUNTER — Other Ambulatory Visit: Payer: Self-pay

## 2017-01-18 MED ORDER — APIXABAN 5 MG PO TABS: 5.0000 mg | ORAL_TABLET | Freq: Two times a day (BID) | ORAL | 0 refills | Status: DC

## 2017-01-18 NOTE — Telephone Encounter (Signed)
°  New Prob  Requesting to speak to nurse regarding recent blood thinner medication changes made during recent hospital visit. Please call.

## 2017-01-18 NOTE — Telephone Encounter (Signed)
**Note De-Identified  Obfuscation** I s/w the pt. He states that he cannot afford his Eliquis. He is advised that I will do a tier exception if not today I will do on Monday. He verbalized understanding and thanked me for calling him back.

## 2017-01-18 NOTE — Telephone Encounter (Signed)
Spoke with pt and he asked that I speak with his ex wife.  Spoke with her and she wanted to know if when pt runs out of samples of Eliquis given at the hospital, could he go back on Plavix because Eliquis is not affordable.  Advised wife these medications do not do the same thing and he would need to be on an anticoagulant.  They were not given a copay card at the hospital.  Advised I would send message to my Prior Auth team to see if we can get price down for pt or see if he qualifies for pt assistance.

## 2017-01-21 NOTE — Telephone Encounter (Signed)
I have completed a tier exception for Eliquis through covermymeds.

## 2017-01-22 ENCOUNTER — Telehealth (HOSPITAL_COMMUNITY): Payer: Self-pay | Admitting: *Deleted

## 2017-01-22 DIAGNOSIS — I25119 Atherosclerotic heart disease of native coronary artery with unspecified angina pectoris: Secondary | ICD-10-CM | POA: Diagnosis not present

## 2017-01-22 DIAGNOSIS — I130 Hypertensive heart and chronic kidney disease with heart failure and stage 1 through stage 4 chronic kidney disease, or unspecified chronic kidney disease: Secondary | ICD-10-CM | POA: Diagnosis not present

## 2017-01-22 DIAGNOSIS — N183 Chronic kidney disease, stage 3 (moderate): Secondary | ICD-10-CM | POA: Diagnosis not present

## 2017-01-22 DIAGNOSIS — J449 Chronic obstructive pulmonary disease, unspecified: Secondary | ICD-10-CM | POA: Diagnosis not present

## 2017-01-22 DIAGNOSIS — I484 Atypical atrial flutter: Secondary | ICD-10-CM | POA: Diagnosis not present

## 2017-01-22 DIAGNOSIS — I5032 Chronic diastolic (congestive) heart failure: Secondary | ICD-10-CM | POA: Diagnosis not present

## 2017-01-22 NOTE — Telephone Encounter (Signed)
Called patient to review instructions for nuclear stress test. Patient wants to cancel test and call back to reschedule.  Mitchell [name]

## 2017-01-22 NOTE — Telephone Encounter (Signed)
**Note De-identified Naly Schwanz Obfuscation** Approval on Eliquis tier exception received Chrystian Cupples fax from [employer]. Approval good until 04/15/17. 

## 2017-01-25 DIAGNOSIS — N183 Chronic kidney disease, stage 3 (moderate): Secondary | ICD-10-CM | POA: Diagnosis not present

## 2017-01-25 DIAGNOSIS — I130 Hypertensive heart and chronic kidney disease with heart failure and stage 1 through stage 4 chronic kidney disease, or unspecified chronic kidney disease: Secondary | ICD-10-CM | POA: Diagnosis not present

## 2017-01-25 DIAGNOSIS — I484 Atypical atrial flutter: Secondary | ICD-10-CM | POA: Diagnosis not present

## 2017-01-25 DIAGNOSIS — I5032 Chronic diastolic (congestive) heart failure: Secondary | ICD-10-CM | POA: Diagnosis not present

## 2017-01-25 DIAGNOSIS — I25119 Atherosclerotic heart disease of native coronary artery with unspecified angina pectoris: Secondary | ICD-10-CM | POA: Diagnosis not present

## 2017-01-25 DIAGNOSIS — J449 Chronic obstructive pulmonary disease, unspecified: Secondary | ICD-10-CM | POA: Diagnosis not present

## 2017-01-28 ENCOUNTER — Encounter (HOSPITAL_COMMUNITY): Payer: Medicare PPO

## 2017-02-01 DIAGNOSIS — I130 Hypertensive heart and chronic kidney disease with heart failure and stage 1 through stage 4 chronic kidney disease, or unspecified chronic kidney disease: Secondary | ICD-10-CM | POA: Diagnosis not present

## 2017-02-01 DIAGNOSIS — I484 Atypical atrial flutter: Secondary | ICD-10-CM | POA: Diagnosis not present

## 2017-02-01 DIAGNOSIS — I509 Heart failure, unspecified: Secondary | ICD-10-CM | POA: Diagnosis not present

## 2017-02-01 DIAGNOSIS — I25119 Atherosclerotic heart disease of native coronary artery with unspecified angina pectoris: Secondary | ICD-10-CM | POA: Diagnosis not present

## 2017-02-01 DIAGNOSIS — N183 Chronic kidney disease, stage 3 (moderate): Secondary | ICD-10-CM | POA: Diagnosis not present

## 2017-02-01 DIAGNOSIS — I10 Essential (primary) hypertension: Secondary | ICD-10-CM | POA: Diagnosis not present

## 2017-02-01 DIAGNOSIS — I5032 Chronic diastolic (congestive) heart failure: Secondary | ICD-10-CM | POA: Diagnosis not present

## 2017-02-01 DIAGNOSIS — J449 Chronic obstructive pulmonary disease, unspecified: Secondary | ICD-10-CM | POA: Diagnosis not present

## 2017-02-04 DIAGNOSIS — J449 Chronic obstructive pulmonary disease, unspecified: Secondary | ICD-10-CM | POA: Diagnosis not present

## 2017-02-04 DIAGNOSIS — I484 Atypical atrial flutter: Secondary | ICD-10-CM | POA: Diagnosis not present

## 2017-02-04 DIAGNOSIS — I25119 Atherosclerotic heart disease of native coronary artery with unspecified angina pectoris: Secondary | ICD-10-CM | POA: Diagnosis not present

## 2017-02-04 DIAGNOSIS — N183 Chronic kidney disease, stage 3 (moderate): Secondary | ICD-10-CM | POA: Diagnosis not present

## 2017-02-04 DIAGNOSIS — I130 Hypertensive heart and chronic kidney disease with heart failure and stage 1 through stage 4 chronic kidney disease, or unspecified chronic kidney disease: Secondary | ICD-10-CM | POA: Diagnosis not present

## 2017-02-04 DIAGNOSIS — I5032 Chronic diastolic (congestive) heart failure: Secondary | ICD-10-CM | POA: Diagnosis not present

## 2017-02-05 ENCOUNTER — Other Ambulatory Visit: Payer: Self-pay | Admitting: *Deleted

## 2017-02-05 MED ORDER — APIXABAN 5 MG PO TABS: 5.0000 mg | ORAL_TABLET | Freq: Two times a day (BID) | ORAL | 5 refills | Status: DC

## 2017-02-07 DIAGNOSIS — I484 Atypical atrial flutter: Secondary | ICD-10-CM | POA: Diagnosis not present

## 2017-02-07 DIAGNOSIS — I130 Hypertensive heart and chronic kidney disease with heart failure and stage 1 through stage 4 chronic kidney disease, or unspecified chronic kidney disease: Secondary | ICD-10-CM | POA: Diagnosis not present

## 2017-02-07 DIAGNOSIS — N183 Chronic kidney disease, stage 3 (moderate): Secondary | ICD-10-CM | POA: Diagnosis not present

## 2017-02-07 DIAGNOSIS — I25119 Atherosclerotic heart disease of native coronary artery with unspecified angina pectoris: Secondary | ICD-10-CM | POA: Diagnosis not present

## 2017-02-07 DIAGNOSIS — I5032 Chronic diastolic (congestive) heart failure: Secondary | ICD-10-CM | POA: Diagnosis not present

## 2017-02-07 DIAGNOSIS — J449 Chronic obstructive pulmonary disease, unspecified: Secondary | ICD-10-CM | POA: Diagnosis not present

## 2017-02-12 DIAGNOSIS — J449 Chronic obstructive pulmonary disease, unspecified: Secondary | ICD-10-CM | POA: Diagnosis not present

## 2017-02-12 DIAGNOSIS — I25119 Atherosclerotic heart disease of native coronary artery with unspecified angina pectoris: Secondary | ICD-10-CM | POA: Diagnosis not present

## 2017-02-12 DIAGNOSIS — I5032 Chronic diastolic (congestive) heart failure: Secondary | ICD-10-CM | POA: Diagnosis not present

## 2017-02-12 DIAGNOSIS — N183 Chronic kidney disease, stage 3 (moderate): Secondary | ICD-10-CM | POA: Diagnosis not present

## 2017-02-12 DIAGNOSIS — I130 Hypertensive heart and chronic kidney disease with heart failure and stage 1 through stage 4 chronic kidney disease, or unspecified chronic kidney disease: Secondary | ICD-10-CM | POA: Diagnosis not present

## 2017-02-12 DIAGNOSIS — I484 Atypical atrial flutter: Secondary | ICD-10-CM | POA: Diagnosis not present

## 2017-02-14 DIAGNOSIS — I5032 Chronic diastolic (congestive) heart failure: Secondary | ICD-10-CM | POA: Diagnosis not present

## 2017-02-14 DIAGNOSIS — N183 Chronic kidney disease, stage 3 (moderate): Secondary | ICD-10-CM | POA: Diagnosis not present

## 2017-02-14 DIAGNOSIS — I25119 Atherosclerotic heart disease of native coronary artery with unspecified angina pectoris: Secondary | ICD-10-CM | POA: Diagnosis not present

## 2017-02-14 DIAGNOSIS — I484 Atypical atrial flutter: Secondary | ICD-10-CM | POA: Diagnosis not present

## 2017-02-14 DIAGNOSIS — I130 Hypertensive heart and chronic kidney disease with heart failure and stage 1 through stage 4 chronic kidney disease, or unspecified chronic kidney disease: Secondary | ICD-10-CM | POA: Diagnosis not present

## 2017-02-14 DIAGNOSIS — J449 Chronic obstructive pulmonary disease, unspecified: Secondary | ICD-10-CM | POA: Diagnosis not present

## 2017-02-18 DIAGNOSIS — J449 Chronic obstructive pulmonary disease, unspecified: Secondary | ICD-10-CM | POA: Diagnosis not present

## 2017-02-18 DIAGNOSIS — I130 Hypertensive heart and chronic kidney disease with heart failure and stage 1 through stage 4 chronic kidney disease, or unspecified chronic kidney disease: Secondary | ICD-10-CM | POA: Diagnosis not present

## 2017-02-18 DIAGNOSIS — I484 Atypical atrial flutter: Secondary | ICD-10-CM | POA: Diagnosis not present

## 2017-02-18 DIAGNOSIS — I5032 Chronic diastolic (congestive) heart failure: Secondary | ICD-10-CM | POA: Diagnosis not present

## 2017-02-18 DIAGNOSIS — N183 Chronic kidney disease, stage 3 (moderate): Secondary | ICD-10-CM | POA: Diagnosis not present

## 2017-02-18 DIAGNOSIS — I25119 Atherosclerotic heart disease of native coronary artery with unspecified angina pectoris: Secondary | ICD-10-CM | POA: Diagnosis not present

## 2017-02-22 DIAGNOSIS — J449 Chronic obstructive pulmonary disease, unspecified: Secondary | ICD-10-CM | POA: Diagnosis not present

## 2017-02-22 DIAGNOSIS — I130 Hypertensive heart and chronic kidney disease with heart failure and stage 1 through stage 4 chronic kidney disease, or unspecified chronic kidney disease: Secondary | ICD-10-CM | POA: Diagnosis not present

## 2017-02-22 DIAGNOSIS — I25119 Atherosclerotic heart disease of native coronary artery with unspecified angina pectoris: Secondary | ICD-10-CM | POA: Diagnosis not present

## 2017-02-22 DIAGNOSIS — N183 Chronic kidney disease, stage 3 (moderate): Secondary | ICD-10-CM | POA: Diagnosis not present

## 2017-02-22 DIAGNOSIS — I5032 Chronic diastolic (congestive) heart failure: Secondary | ICD-10-CM | POA: Diagnosis not present

## 2017-02-22 DIAGNOSIS — I484 Atypical atrial flutter: Secondary | ICD-10-CM | POA: Diagnosis not present

## 2017-02-25 DIAGNOSIS — I25119 Atherosclerotic heart disease of native coronary artery with unspecified angina pectoris: Secondary | ICD-10-CM | POA: Diagnosis not present

## 2017-02-25 DIAGNOSIS — J449 Chronic obstructive pulmonary disease, unspecified: Secondary | ICD-10-CM | POA: Diagnosis not present

## 2017-02-25 DIAGNOSIS — I130 Hypertensive heart and chronic kidney disease with heart failure and stage 1 through stage 4 chronic kidney disease, or unspecified chronic kidney disease: Secondary | ICD-10-CM | POA: Diagnosis not present

## 2017-02-25 DIAGNOSIS — I5032 Chronic diastolic (congestive) heart failure: Secondary | ICD-10-CM | POA: Diagnosis not present

## 2017-02-25 DIAGNOSIS — N183 Chronic kidney disease, stage 3 (moderate): Secondary | ICD-10-CM | POA: Diagnosis not present

## 2017-02-25 DIAGNOSIS — I484 Atypical atrial flutter: Secondary | ICD-10-CM | POA: Diagnosis not present

## 2017-02-28 DIAGNOSIS — I484 Atypical atrial flutter: Secondary | ICD-10-CM | POA: Diagnosis not present

## 2017-02-28 DIAGNOSIS — J449 Chronic obstructive pulmonary disease, unspecified: Secondary | ICD-10-CM | POA: Diagnosis not present

## 2017-02-28 DIAGNOSIS — I5032 Chronic diastolic (congestive) heart failure: Secondary | ICD-10-CM | POA: Diagnosis not present

## 2017-02-28 DIAGNOSIS — I130 Hypertensive heart and chronic kidney disease with heart failure and stage 1 through stage 4 chronic kidney disease, or unspecified chronic kidney disease: Secondary | ICD-10-CM | POA: Diagnosis not present

## 2017-02-28 DIAGNOSIS — I25119 Atherosclerotic heart disease of native coronary artery with unspecified angina pectoris: Secondary | ICD-10-CM | POA: Diagnosis not present

## 2017-02-28 DIAGNOSIS — N183 Chronic kidney disease, stage 3 (moderate): Secondary | ICD-10-CM | POA: Diagnosis not present

## 2017-03-04 DIAGNOSIS — I130 Hypertensive heart and chronic kidney disease with heart failure and stage 1 through stage 4 chronic kidney disease, or unspecified chronic kidney disease: Secondary | ICD-10-CM | POA: Diagnosis not present

## 2017-03-04 DIAGNOSIS — J449 Chronic obstructive pulmonary disease, unspecified: Secondary | ICD-10-CM | POA: Diagnosis not present

## 2017-03-04 DIAGNOSIS — I509 Heart failure, unspecified: Secondary | ICD-10-CM | POA: Diagnosis not present

## 2017-03-04 DIAGNOSIS — N183 Chronic kidney disease, stage 3 (moderate): Secondary | ICD-10-CM | POA: Diagnosis not present

## 2017-03-04 DIAGNOSIS — I5032 Chronic diastolic (congestive) heart failure: Secondary | ICD-10-CM | POA: Diagnosis not present

## 2017-03-04 DIAGNOSIS — I10 Essential (primary) hypertension: Secondary | ICD-10-CM | POA: Diagnosis not present

## 2017-03-04 DIAGNOSIS — I484 Atypical atrial flutter: Secondary | ICD-10-CM | POA: Diagnosis not present

## 2017-03-04 DIAGNOSIS — I25119 Atherosclerotic heart disease of native coronary artery with unspecified angina pectoris: Secondary | ICD-10-CM | POA: Diagnosis not present

## 2017-03-06 DIAGNOSIS — I25119 Atherosclerotic heart disease of native coronary artery with unspecified angina pectoris: Secondary | ICD-10-CM | POA: Diagnosis not present

## 2017-03-06 DIAGNOSIS — I484 Atypical atrial flutter: Secondary | ICD-10-CM | POA: Diagnosis not present

## 2017-03-06 DIAGNOSIS — I130 Hypertensive heart and chronic kidney disease with heart failure and stage 1 through stage 4 chronic kidney disease, or unspecified chronic kidney disease: Secondary | ICD-10-CM | POA: Diagnosis not present

## 2017-03-06 DIAGNOSIS — J449 Chronic obstructive pulmonary disease, unspecified: Secondary | ICD-10-CM | POA: Diagnosis not present

## 2017-03-06 DIAGNOSIS — I5032 Chronic diastolic (congestive) heart failure: Secondary | ICD-10-CM | POA: Diagnosis not present

## 2017-03-06 DIAGNOSIS — N183 Chronic kidney disease, stage 3 (moderate): Secondary | ICD-10-CM | POA: Diagnosis not present

## 2017-03-11 DIAGNOSIS — I130 Hypertensive heart and chronic kidney disease with heart failure and stage 1 through stage 4 chronic kidney disease, or unspecified chronic kidney disease: Secondary | ICD-10-CM | POA: Diagnosis not present

## 2017-03-11 DIAGNOSIS — I484 Atypical atrial flutter: Secondary | ICD-10-CM | POA: Diagnosis not present

## 2017-03-11 DIAGNOSIS — N183 Chronic kidney disease, stage 3 (moderate): Secondary | ICD-10-CM | POA: Diagnosis not present

## 2017-03-11 DIAGNOSIS — I25119 Atherosclerotic heart disease of native coronary artery with unspecified angina pectoris: Secondary | ICD-10-CM | POA: Diagnosis not present

## 2017-03-11 DIAGNOSIS — I5032 Chronic diastolic (congestive) heart failure: Secondary | ICD-10-CM | POA: Diagnosis not present

## 2017-03-11 DIAGNOSIS — J449 Chronic obstructive pulmonary disease, unspecified: Secondary | ICD-10-CM | POA: Diagnosis not present

## 2017-04-03 DIAGNOSIS — I10 Essential (primary) hypertension: Secondary | ICD-10-CM | POA: Diagnosis not present

## 2017-04-03 DIAGNOSIS — J449 Chronic obstructive pulmonary disease, unspecified: Secondary | ICD-10-CM | POA: Diagnosis not present

## 2017-04-03 DIAGNOSIS — I509 Heart failure, unspecified: Secondary | ICD-10-CM | POA: Diagnosis not present

## 2017-04-08 DIAGNOSIS — L0291 Cutaneous abscess, unspecified: Secondary | ICD-10-CM | POA: Diagnosis not present

## 2017-04-08 DIAGNOSIS — M179 Osteoarthritis of knee, unspecified: Secondary | ICD-10-CM | POA: Diagnosis not present

## 2017-06-03 DIAGNOSIS — K5900 Constipation, unspecified: Secondary | ICD-10-CM | POA: Diagnosis not present

## 2017-06-03 DIAGNOSIS — J449 Chronic obstructive pulmonary disease, unspecified: Secondary | ICD-10-CM | POA: Diagnosis not present

## 2017-06-03 DIAGNOSIS — I10 Essential (primary) hypertension: Secondary | ICD-10-CM | POA: Diagnosis not present

## 2017-06-03 DIAGNOSIS — K219 Gastro-esophageal reflux disease without esophagitis: Secondary | ICD-10-CM | POA: Diagnosis not present

## 2017-06-03 DIAGNOSIS — H538 Other visual disturbances: Secondary | ICD-10-CM | POA: Diagnosis not present

## 2017-06-03 DIAGNOSIS — I252 Old myocardial infarction: Secondary | ICD-10-CM | POA: Diagnosis not present

## 2017-06-03 DIAGNOSIS — H9193 Unspecified hearing loss, bilateral: Secondary | ICD-10-CM | POA: Diagnosis not present

## 2017-06-03 DIAGNOSIS — I4891 Unspecified atrial fibrillation: Secondary | ICD-10-CM | POA: Diagnosis not present

## 2017-06-03 DIAGNOSIS — I209 Angina pectoris, unspecified: Secondary | ICD-10-CM | POA: Diagnosis not present

## 2017-06-04 DIAGNOSIS — J449 Chronic obstructive pulmonary disease, unspecified: Secondary | ICD-10-CM | POA: Diagnosis not present

## 2017-06-10 ENCOUNTER — Telehealth: Payer: Self-pay | Admitting: Interventional Cardiology

## 2017-06-10 DIAGNOSIS — R609 Edema, unspecified: Secondary | ICD-10-CM | POA: Diagnosis not present

## 2017-06-10 DIAGNOSIS — E7800 Pure hypercholesterolemia, unspecified: Secondary | ICD-10-CM | POA: Diagnosis not present

## 2017-06-10 DIAGNOSIS — D729 Disorder of white blood cells, unspecified: Secondary | ICD-10-CM | POA: Diagnosis not present

## 2017-06-10 DIAGNOSIS — M109 Gout, unspecified: Secondary | ICD-10-CM | POA: Diagnosis not present

## 2017-06-10 DIAGNOSIS — J449 Chronic obstructive pulmonary disease, unspecified: Secondary | ICD-10-CM | POA: Diagnosis not present

## 2017-06-10 DIAGNOSIS — I10 Essential (primary) hypertension: Secondary | ICD-10-CM | POA: Diagnosis not present

## 2017-06-10 NOTE — Telephone Encounter (Signed)
Pt c/o medication issue:  1. Name of Medication: Eliquis    2. How are you currently taking this medication (dosage and times per day)? 5 mg // 2x a day  3. Are you having a reaction (difficulty breathing--STAT)? no 4. What is your medication issue? Medication is too costly for patient, he would like to know if he can switch back over to atenolol, once he runs out of Eliquis?

## 2017-06-10 NOTE — Telephone Encounter (Signed)
Spoke with pt and he asked that I speak with [name].  Ashok Cordiadvised [name] that I would place samples at the front for pt to pick up and send message to my PA team.  Pt had PA last year and it expired 04/15/17.  [name] verbalized understanding and was appreciative for call.

## 2017-06-11 NOTE — Telephone Encounter (Signed)
**Note De-Identified  Obfuscation** The pt is aware that I have done a tier exception on his Eliquis through covermymeds.  He is requesting a call once we receive the decision of this tier exception.

## 2017-06-12 NOTE — Telephone Encounter (Signed)
**Note De-Identified Mitchell [name] Obfuscation** Denial letter received on the pts Eliquis tier exception [facility]salan Brisbin [facility].  I have completed appeal form that was included with the letter and [facility] to [facility].

## 2017-06-13 ENCOUNTER — Telehealth: Payer: Self-pay | Admitting: *Deleted

## 2017-06-13 NOTE — Telephone Encounter (Signed)
[facility], it was denied stating, "eliquis is brand name drug and is covered for you at the cost sharing tier listed in your 2019 drug Guide. Medicare rules changed for 2019. Since there isnt an alternative brand name drug for your condition in a lower cost sharing tier, a tier exception is not permitted."  I called and left patient message with this information.

## 2017-06-13 NOTE — Telephone Encounter (Signed)
I called [employer] for patient and found out the following information regarding his deductible and co-pays. He has not yet met his deductible of $265.00 for this year, after that is paid his co-pay for ELIQUIS is $47.00 for 30 days or $131.00 for 90 days. I relayed this information to patient and he states he cannot afford his deductible. He has mentioned wanting to go back on Atenolol instead of eliquis, I explained to him the difference in the medications and why he is on eliquis. I explained to him I will relay this information to Dr Tamala Julian so they can decide what to do.   Mitchell [name] was started on this medication during a hospital admission dx with atrial flutter 01/04/2017.

## 2017-06-14 ENCOUNTER — Other Ambulatory Visit: Payer: Self-pay

## 2017-06-14 NOTE — Patient Outreach (Signed)
[facility](THN) Care Management  06/14/2017  Mitchell BossierJarald Dean [name] Feb 25, 1941 [name]   Referral Date: 06/12/17 Referral Source: MD referral Referral Reason: unable to afford his medications   Outreach Attempt: Spoke with patient.  Patient is able to verify HIPAA.  Patient remembers doctor telling him that he would refer him to someone to help him get his medications. He states that his copay for his Eliquis is $300.00 and he just cannot afford it.  He also states he is unable to afford his Dulera.   Social: Patient lives alone has some support from ex-wife.  He states he has 3 sons but they are not involved   Conditions: Patient has Atrial Flutter, COPD, HTN, Hyperlipidemia, and Gout.    Medications: Patient unable to afford Eliquis and Dulera.  He states that his co-pay for his Eliquis is $300. 00.  Currently he has samples from Dr. Mendel RyderH Smith.  Ex-wife helps him fill his pill box. He states that she is getting frustrated with him and fixing his medication.  Patient requesting assistance with medication management.   Appointments: Patient saw primary physician on Monday.  His ex-wife takes him to his appointments.   Advanced Directives: Patient does not have an advanced directive at this time.    Consent: [facility] with [facility].  Patient consents to services to assist with getting his medications.   Plan: RN CM will refer patient to pharmacy for medication management and patient assistance in getting his Eliquis and Dulera.    Mitchell Lericheionne J Krystofer Hevener, RN, MSN [name] Care Management Care Management Coordinator Direct Line [phone] Toll Free: [phone]  Fax: [phone]

## 2017-06-14 NOTE — Telephone Encounter (Signed)
The patient needs to evaluate his formulary to see if there is a more cost effective treatment.  The other option would be to convert therapy to Coumadin.

## 2017-06-14 NOTE — Telephone Encounter (Signed)
Spoke with pt and he would like to stay on the Eliquis.  Pt asked if he could get 2 more weeks of samples to last him until he gets paid and then he will pick up prescription from pharmacy.  Advised pt I will place samples at front desk and he will come next week to pick up.  Advised pt if something comes up and he can't get his prescription from pharmacy to let us know and we will revisit switching him to Coumadin.  Pt verbalized understanding and was appreciative for call.

## 2017-06-14 NOTE — Telephone Encounter (Signed)
Follow up    Patient is calling back about Eliquis. He states that it cost to much and would like an alternative. Please call to discuss.

## 2017-06-18 ENCOUNTER — Telehealth: Payer: Self-pay | Admitting: Pharmacist

## 2017-06-18 NOTE — Patient Outreach (Signed)
[facility](THN) Care Management  [facility]   06/18/2017  Mitchell [name] June 16, 1940 [id number]  Subjective:  Objective:   Encounter Medications: Outpatient Encounter Medications as of 06/18/2017  Medication Sig  . ezetimibe (ZETIA) 10 MG tablet Take 10 mg by mouth daily.  . potassium chloride (KLOR-CON 10) 10 MEQ tablet Take 10 mEq by mouth daily.  Marland Kitchen. albuterol (PROVENTIL HFA;VENTOLIN HFA) 108 (90 BASE) MCG/ACT inhaler Inhale 2 puffs into the lungs every 6 (six) hours as needed for wheezing. Reported on 05/04/2015  . apixaban (ELIQUIS) 5 MG TABS tablet Take 1 tablet (5 mg total) by mouth 2 (two) times daily.  Marland Kitchen. aspirin EC 81 MG tablet Take 1 tablet (81 mg total) by mouth daily.  . colchicine 0.6 MG tablet Take 1 tablet (0.6 mg total) by mouth 2 (two) times daily.  . famotidine (PEPCID) 20 MG tablet Take 1 tablet (20 mg total) by mouth 2 (two) times daily. (Patient not taking: Reported on 12/03/2016)  . finasteride (PROSCAR) 5 MG tablet Take 5 mg by mouth daily. Reported on 05/04/2015  . furosemide (LASIX) 20 MG tablet Take 1 tablet (20 mg total) by mouth daily.  . metoprolol tartrate (LOPRESSOR) 25 MG tablet Take 1 tablet (25 mg total) by mouth 2 (two) times daily.  . mometasone-formoterol (DULERA) 100-5 MCG/ACT AERO Inhale 2 puffs into the lungs 2 (two) times daily. (Patient not taking: Reported on 01/04/2017)  . nitroGLYCERIN (NITROSTAT) 0.4 MG SL tablet Place 0.4 mg under the tongue every 5 (five) minutes as needed. Reported on 05/04/2015  . polyethylene glycol (MIRALAX / GLYCOLAX) packet Take 17 g by mouth 2 (two) times daily. (Patient not taking: Reported on 01/04/2017)  . tamsulosin (FLOMAX) 0.4 MG CAPS capsule Take 0.4 mg by mouth daily after breakfast. Reported on 05/04/2015   No facility-administered encounter medications on file as of 06/18/2017.     Functional Status: In your present state of health, do you have any difficulty performing the following  activities: 01/05/2017  Hearing? N  Vision? N  Difficulty concentrating or making decisions? N  Walking or climbing stairs? Y  Dressing or bathing? N  Doing errands, shopping? N  Some recent data might be hidden    Fall/Depression Screening: Fall Risk  08/09/2015 07/01/2015 06/06/2015  Falls in the past year? Yes Yes Yes  Number falls in past yr: 1 1 1   Injury with Fall? - - -  Risk for fall due to : - - -  Risk for fall due to: Comment - - -  Follow up - Falls prevention discussed Falls prevention discussed   PHQ 2/9 Scores 06/14/2017 08/09/2015 07/01/2015 06/06/2015 02/15/2015  PHQ - 2 Score 1 0 0 0 0      Assessment:  Plan:

## 2017-06-19 NOTE — Patient Outreach (Addendum)
[facility]) Care Management  06/18/2017  Mitchell [name] 11/26/1940 [id number]   Patient was called regarding medication assistance with Eliquis. HIPAA identifiers were obtained.  Patient is a 77 year old male with multiple medical conditions including but not limited to:  Atrial flutter, CAD, CKD stage III, gout, depression hyperlipidemia, obesity, osteoarthritis of right knee and vision impairment.  Patient said he cannot read his medication labels due to his vision impairment and could not complete a medication review over the phone.    However, he was very upset about Eliquis because he was told it would be $300 and he cannot pay that every month.  Patient is over income for the Extra Help Program provided by [employer].   Patient has [employer].    [employer] was called and the following information was discovered:  -patient has a deductible of $265 for medications > tier 1 or 2 -patient has not met his deductible -patient's copay for Eliquis will be $300 on the initial fill and then will decrease to $47 while he is in the initial coverage phase -patient may be eligible for [place] patient assistance program for Eliquis but they require patients to spend 3% of their income on out of pocket medication expenses. Patient has spent about $60 year to date. -patient was instructed to keep all EOBs from [facility] and to locate his Benefit Award letter from [employer].  Patient said he could borrow the money to get his Eliquis this time. (His provider's office gave him 2 weeks worth of samples).  Patient's medication list also includes Dulera. Ruthe Mannan can be obtained from the [employer] Patient Assistance program.  An application will be taken to the patient during the upcoming home visit. (Home Visit scheduled 06/27/17).   Plan: Conduct home visit with the patient on 06/27/17 Complete Patient Assistance Applications and gather necessary  paperwork.  Elayne Guerin, PharmD, [place] Clinical Pharmacist [phone]

## 2017-06-27 ENCOUNTER — Encounter: Payer: Self-pay | Admitting: Pharmacist

## 2017-06-27 ENCOUNTER — Other Ambulatory Visit: Payer: Self-pay | Admitting: Pharmacist

## 2017-06-27 NOTE — Patient Outreach (Addendum)
[facility]) Care Management  [facility] CM Pharmacy   06/27/2017  Mitchell [name] 05-Apr-1941 [id number]  Subjective: Home visit conducted at the patient's home today. [facility] Social Worker, Bevelyn Ngo was present for the visit. Patient is a 77 year old male with multiple medical conditions including but not limited to:  Atrial flutter, CAD, CKD stage III, gout, depression hyperlipidemia, obesity, osteoarthritis of right knee and vision impairment.  Patient was originally referred to [facility] services for medication assistance with Eliquis.    It was determined the patient had a deductible of $265 and would have to pay the full copay for Eliquis ($313) this refill and then his copay would decrease to $47.00  Patient picked up the Eliquis and had it in his possession at the home visit.  Patient does not see well. His ex-wife, (Ms. Mitchell [name]), fills his pill box for him once a week.   Objective:   Encounter Medications: Outpatient Encounter Medications as of 06/27/2017  Medication Sig Note  . acetaminophen (ACETAMINOPHEN EXTRA STRENGTH) 500 MG tablet Take 500 mg by mouth every 6 (six) hours as needed.   Marland Kitchen apixaban (ELIQUIS) 5 MG TABS tablet Take 1 tablet (5 mg total) by mouth 2 (two) times daily.   Marland Kitchen aspirin EC 81 MG tablet Take 1 tablet (81 mg total) by mouth daily.   Marland Kitchen BISACODYL PO Take 1 tablet by mouth daily as needed.   . diclofenac (VOLTAREN) 75 MG EC tablet 75 mg. Take 2 tablets at the onset of gout then 1 tablet twice daily as needed.   . docusate sodium (COLACE) 100 MG capsule Take 100 mg by mouth daily as needed for mild constipation.   Marland Kitchen ezetimibe (ZETIA) 10 MG tablet Take 10 mg by mouth daily.   . finasteride (PROSCAR) 5 MG tablet Take 5 mg by mouth daily. Reported on 05/04/2015   . furosemide (LASIX) 20 MG tablet Take 1 tablet (20 mg total) by mouth daily.   . metoprolol tartrate (LOPRESSOR) 25 MG tablet Take 1 tablet (25 mg total) by mouth 2 (two) times daily.    . Multiple Vitamins-Minerals (EYE VITAMINS & MINERALS PO) Take 1 tablet by mouth daily.   . nitroGLYCERIN (NITROSTAT) 0.4 MG SL tablet Place 0.4 mg under the tongue every 5 (five) minutes as needed. Reported on 05/04/2015   . potassium chloride (KLOR-CON 10) 10 MEQ tablet Take 10 mEq by mouth daily.   . tamsulosin (FLOMAX) 0.4 MG CAPS capsule Take 0.4 mg by mouth daily after breakfast. Reported on 05/04/2015   . albuterol (PROVENTIL HFA;VENTOLIN HFA) 108 (90 BASE) MCG/ACT inhaler Inhale 2 puffs into the lungs every 6 (six) hours as needed for wheezing. Reported on 05/04/2015 06/27/2017: Cost  . colchicine 0.6 MG tablet Take 1 tablet (0.6 mg total) by mouth 2 (two) times daily. (Patient not taking: Reported on 06/27/2017)   . famotidine (PEPCID) 20 MG tablet Take 1 tablet (20 mg total) by mouth 2 (two) times daily. (Patient not taking: Reported on 12/03/2016)   . mometasone-formoterol (DULERA) 100-5 MCG/ACT AERO Inhale 2 puffs into the lungs 2 (two) times daily. (Patient not taking: Reported on 06/27/2017) 06/27/2017: cost  . polyethylene glycol (MIRALAX / GLYCOLAX) packet Take 17 g by mouth 2 (two) times daily. (Patient not taking: Reported on 01/04/2017)    No facility-administered encounter medications on file as of 06/27/2017.     Functional Status: In your present state of health, do you have any difficulty performing the following activities: 01/05/2017  Hearing? N  Vision? N  Difficulty concentrating or making decisions? N  Walking or climbing stairs? Y  Dressing or bathing? N  Doing errands, shopping? N  Some recent data might be hidden    Fall/Depression Screening: Fall Risk  08/09/2015 07/01/2015 06/06/2015  Falls in the past year? Yes Yes Yes  Number falls in past yr: 1 1 1   Injury with Fall? - - -  Risk for fall due to : - - -  Risk for fall due to: Comment - - -  Follow up - Falls prevention discussed Falls prevention discussed   PHQ 2/9 Scores 06/14/2017 08/09/2015 07/01/2015 06/06/2015  02/15/2015  PHQ - 2 Score 1 0 0 0 0      Assessment: Patient's medications were reviewed while at his home:   Drugs sorted by system:  Neurologic/Psychologic:  Cardiovascular: Eliquis Aspirin Ezetimibe fuosemide Nitroglycerin   Pulmonary/Allergy: Albuterol HFA (patient does not have) Dulera (patient used all of this. His son gave him a Symbicort inhaler)  Gastrointestinal: Bisacodyl Docusate Sodium Famotidine Polyethylene glycol (patient said not taking)  Pain: Acetaminophen Diclofenac   Vitamins/Minerals: Multiple Vitamins and Minerals Potassium Chloride  Miscellaneous: Finasteride Tamsulosin Colchicine (Patient said not taking)   Medication Review Findings: Omitted medications: Diclofenac Sodium, Biacodyl 5mg , Docusate, and Acetaminophen were all added to the patient's medication list as he says he is taking them.  Adherence issues- Aspirin- on the patient's medication list but was not being put in his medication box but he says he is taking.  Potassium-patient said he thought potassium was causing him to feel tired so he only takes it "sometimes".  He had about 6 tablets left in his bottle. Patient was educated on the importance of taking his potassium.  He agreed to take the potassium with supper and admitted he did not know if the potassium was causing him to be fatigued or not.    [employer] pharmacy was called on the patient's behalf and Potassium was ordered to be refilled.  Ezetimibe-was not in the patient's pill box but was in his home.  He said he takes it "sometimes" but committed to taking it everyday after our visit.  Patient's ex-wife was educated to begin to put the Ezetimibe in the patient's pill box.  [employer] quoted a copay of $62 for a 3 month supply of Ezetimibe.  (Patient declined the refill.  He had about 20 tablets left in his bottle.)Patient may qualify for brand name Zetia through [name]'s patient assistance program.  Dulera-patient did  not have any inhalations left and said he cannot afford to purchase it.  His son gave him a Symbicort inhaler to use "when he was in an emergent situation".  Albuterol HFA-patient did not have a rescue inhaler in his possession and said he cannot afford to purchase  Patient was educated on the difference between resucue inhalers and maintenance inhalers.   Discontinued medications still in the patient's possession: Clopidogrel-this was recently sent to the patient's home--[employer] was called and they were asked to delete it off the patient's medication list  Tizanidine, Atenolol, Doxycycline, Naproxen and Tramadol--all were placed in a "no longer taking" bag and separated from the patient's other medications.  Medication Assistance Findings:  Patient is over income for the "extra help" program offered through [employer].    Patient appears to meet financial criteria for [name]'s Patient Assistance Program to obtain: Proventil HFA, Elwin Sleight, and   Brand name Zetia.  Patient also appears to meet financial criteria for [facility]  [employer]'s program for Eliquis. However, that program requires patient's to spend 3% of their income on medication expenses.  The patient has about $75 left to meet this requirement.  Patient was asked to keep all of his [employer] Estimation of Benefits letter so that they can be faxed with his application to prove out-of-pocket medication expenses.    Signed applications as well as the required financial documentation was obtained from the patient with the exception of the most recent [employer] EOB.    Plan: Route note to the patient's provider requesting they sign the [name] application that will be mailed to them and send hard copy prescriptions for: Dulera, Proventil HFA and brand name Zetia.  Follow up with the patient in 5-7 business days.  Meet with [facility] Pharmacy Technician Lilla ShookAshley Coleman, to assure completion of forms.   Beecher McardleKatina J. Xena Propst, PharmD, BCACP [facility]  Clinical Pharmacist [phone]

## 2017-07-01 ENCOUNTER — Encounter: Payer: Self-pay | Admitting: Pharmacist

## 2017-07-01 ENCOUNTER — Other Ambulatory Visit: Payer: Self-pay | Admitting: Pharmacist

## 2017-07-01 ENCOUNTER — Telehealth: Payer: Self-pay

## 2017-07-01 ENCOUNTER — Other Ambulatory Visit: Payer: Self-pay

## 2017-07-01 ENCOUNTER — Other Ambulatory Visit: Payer: Self-pay | Admitting: Pharmacy Technician

## 2017-07-01 MED ORDER — APIXABAN 5 MG PO TABS: 5.0000 mg | ORAL_TABLET | Freq: Two times a day (BID) | ORAL | 3 refills | Status: DC

## 2017-07-01 NOTE — Telephone Encounter (Signed)
**Note De-Identified  Obfuscation** I received a fax from Lilla ShookAshley Coleman, CPhT with [facility] asking me to finish completing a [employer]MS pt assistance application, print an Eliquis RX and to have Dr Katrinka BlazingSmith sign both.  I have completed the application, printed the Elquis RX and placed both in Dr Lonn GeorgiaSmiths mail bin awaiting his signature when he returns to the office on Wednesday 07/03/17.

## 2017-07-01 NOTE — Patient Outreach (Signed)
[facility](THN) Care Management  07/01/2017  Mitchell BossierJarald Dean [name] Mar 12, 1941 [id number]   Received patient assistance referral from Pharmacist Mitchell [name]. I mailed [name] patient assistance application(provider portion) for [employer]ulera, Zetia and Proventil to Dr. Vianne [name]. Mitchell [name] on 03/18. I faxed Mitchell [name] patient assistance application (provider portion) to Mitchell [name] on 03/18.  Mitchell [name] brought patients portion of application in from home visit. However, I will mail out required patient signature page for [name] on 03/19.  Mitchell [name], CPhT [facility] Care Management [phone]

## 2017-07-01 NOTE — Patient Outreach (Deleted)
[facility](THN) Care Management  07/01/2017  Mitchell BossierJarald Dean [name] 1940/10/22 [id number]   Received Mitchell FisherBristol [name] patient assistance referral from Pharmacist Mitchell CobbsKatina [name]. Mailed out patient portion of application 03/18. Faxed provider portion to Mitchell [name] 03/18.   Mitchell SlickAshley N. [name], CPhT [facility] Care Management [phone]

## 2017-07-01 NOTE — Patient Outreach (Addendum)
[facility](THN) Care Management  07/01/2017  Mitchell [name] 1940/08/29 [id number]   Patient was called to follow up on medication assistance. HIPAA identifiers were obtained. A signature was missed on the [employer] application and will be sent to the patient via mail with a return envelope. This was explained to the patient and he communicated understanding.  Plan: Route letter to [facility] Pharmacy Technician, Mitchell [name] to send patient the page of the application requiring a signature.   Mitchell [name] has already started to work on the applications and information obtained from the patient last week.  Mitchell [name] will follow up with the patient and involve pharmacist as needed.  Beecher McardleKatina J. Vada Swift, PharmD, BCACP [facility] Clinical Pharmacist [phone]

## 2017-07-02 DIAGNOSIS — J449 Chronic obstructive pulmonary disease, unspecified: Secondary | ICD-10-CM | POA: Diagnosis not present

## 2017-07-04 NOTE — Telephone Encounter (Signed)
I faxed to Lilla ShookAshley Coleman CPhT completed forms she had sent to us for Dr Katrinka BlazingSmith to sign, this is for [name] patient assistance with [facility].

## 2017-07-05 ENCOUNTER — Ambulatory Visit: Payer: Self-pay | Admitting: Pharmacist

## 2017-07-09 ENCOUNTER — Other Ambulatory Visit: Payer: Self-pay | Admitting: Pharmacy Technician

## 2017-07-09 NOTE — Patient Outreach (Signed)
[facility](THN) Care Management  07/09/2017  Mitchell [name] 03-26-1941 [id number]   Successful outreach call to [name], HIPAA identifiers verified. Spoke to patient in reference to portion of [employer] application I mailed to him to sign. Patient stated that he signed and mailed it back in on Saturday.  Will follow up with patient once additional information from [facility] is received.  Suzan SlickAshley N. Ernesta Ambleoleman, CPhT [facility] Care Management [phone]

## 2017-07-11 ENCOUNTER — Other Ambulatory Visit: Payer: Self-pay | Admitting: Pharmacy Technician

## 2017-07-11 NOTE — Patient Outreach (Signed)
[facility](THN) Care Management  07/11/2017  Mitchell [name] 10-08-1940 [id number]   Received provider portion of [name] application on 03/27. Mailed application into [name] on 03/28.  Will follow up with [name] in 14 days.  Suzan SlickAshley N. Ernesta Ambleoleman, CPhT [facility] Care Management [phone]

## 2017-07-12 ENCOUNTER — Other Ambulatory Visit: Payer: Self-pay | Admitting: Pharmacy Technician

## 2017-07-12 NOTE — Patient Outreach (Signed)
[facility](THN) Care Management  07/12/2017  Mitchell [name] 1940-05-01 [id number]   Successful outreach call to [name], HIPAA identifiers verified. Informed patient that [employer] application will require a EOB/OOP. Patient isn't familiar with how to obtain that information. He requested that I speak to his ex-wife Mitchell [name] about it.  Will contact Mitchell [name]  Mitchell [name], CPhT [facility] Care Management [phone]

## 2017-07-12 NOTE — Patient Outreach (Signed)
[facility](THN) Care Management  07/12/2017  Mitchell BossierJarald Dean [name] May 25, 1940 [id number]  Successful outreach call to patient ex-wife, Mitchell [name], HIPAA identifiers verified. Ms. Mitchell [name] states that she does not keep up with any financial records regarding how much Mitchell [name] has spent out of pocket. Mitchell [name] has give her authorization to help me obtain this information. Ms. Mitchell [name] gave me authorization to create an account on-line account thru [facility] in which I can print the required information out for his Elquis application.  Will follow up with [name] with [facility] account information once made.  Suzan SlickAshley N. Ernesta Ambleoleman, CPhT [facility] Care Management [phone]

## 2017-07-15 DIAGNOSIS — R21 Rash and other nonspecific skin eruption: Secondary | ICD-10-CM | POA: Diagnosis not present

## 2017-07-16 ENCOUNTER — Other Ambulatory Visit: Payer: Self-pay | Admitting: Pharmacy Technician

## 2017-07-16 NOTE — Patient Outreach (Signed)
[facility]) Care Management  07/16/2017  Mitchell BossierJarald Dean [name] Dec 14, 1940 [id number]   Successful outreach call to [name], HIPAA identifiers verified. I informed patient that we would need the printout that [facility] mails out with his Explanation of Benefits in order to submit application to [facility] for his Eliquis. He informed me that he was just informed of a family friend death and requested that I call him back later today. I informed him that I would call him back on Thursday.  Will schedule follow up call for 04/04  Suzan Slickshley N. Ernesta Ambleoleman, CPhT [facility] Care Management [phone]

## 2017-07-18 ENCOUNTER — Ambulatory Visit: Payer: Self-pay | Admitting: Pharmacy Technician

## 2017-07-19 ENCOUNTER — Ambulatory Visit: Payer: Self-pay | Admitting: Pharmacy Technician

## 2017-07-22 ENCOUNTER — Other Ambulatory Visit: Payer: Self-pay | Admitting: Pharmacy Technician

## 2017-07-22 NOTE — Patient Outreach (Signed)
[facility](THN) Care Management  07/22/2017  Mitchell [name] November 04, 1940 [id number]   Successful outreach call to [name], HIPAA identifiers verified. Patient request that I call him back on Wednesday to discuss obtaining his spending amount for [facility] patient assistance. He states that appreciates my patience with him while he is going through the lose of a family friend. The funeral was today.  Will call patient back on Wednesday  Suzan SlickAshley N. Ernesta Ambleoleman, CPhT [facility] Care Management [phone]

## 2017-07-24 ENCOUNTER — Other Ambulatory Visit: Payer: Self-pay | Admitting: Pharmacy Technician

## 2017-07-24 NOTE — Patient Outreach (Signed)
[facility](THN) Care Management  07/24/2017  Mitchell [name] 07/20/1940 [id number]  Successful outreach call to Mitchell [name], HIPAA identifiers verified. Mitchell [name] stated that he has come across benefits from [facility]. He is currently not able to go into his home due to workers spraying for insects which made him have an allergic reaction. He plans to have his friend  Go in the house and get the papers and request that I call him back on Friday after 1, so that we can "hopefully" make arrangements to them into me.  Suzan SlickAshley N. Ernesta Ambleoleman, CPhT [facility] Care Management [phone]

## 2017-07-25 ENCOUNTER — Ambulatory Visit: Payer: Self-pay | Admitting: Pharmacy Technician

## 2017-07-26 ENCOUNTER — Other Ambulatory Visit: Payer: Self-pay | Admitting: Pharmacy Technician

## 2017-07-26 NOTE — Patient Outreach (Signed)
[facility](THN) Care Management  07/26/2017  Allie BossierJarald Dean Magill [birth year] [id number]   Contacted [name] patient assistance in reference to patient application for Proventil, Dulera, and Zetia. Spoke to Ed who stated that application was received and attestation letter was mailed out 04/10.  Will inform patient  Suzan Slickshley N. Ernesta Ambleoleman, CPhT [facility] Care Management [phone]

## 2017-07-26 NOTE — Patient Outreach (Signed)
[facility](THN) Care Management  07/26/2017  Mitchell BossierJarald Dean [name] 1941-03-19 [id number]   Unsuccessful outreach call #1 to patient, HIPAA compliant voicemail left.  Will call back 04/15 if call not returned  Suzan SlickAshley N. Ernesta Ambleoleman, CPhT [facility] Care Management [phone]

## 2017-07-26 NOTE — Patient Outreach (Signed)
[facility]N) Care Management  07/26/2017  Mitchell BossierJarald Dean [name] 19-Dec-1940 [id number]   Returned call from patient, HIPAA identifiers verified. Patient states he is going to attempt to fax me his [employer] explanation of benefits on Monday 04/15. Informed patient that [name] attestation letter was mailed out to him on 04/10. I requested patient to call me as soon as he receives the letter from [employer] and allow me to talk him thru the process of how to fill out letter and what to mail back to company. He stated that he understood and that he would.  Will follow up with patient in the next 7-10 days if I haven't heard from him  Suzan SlickAshley N. Ernesta Ambleoleman, CPhT [facility] Care Management [phone]

## 2017-07-30 ENCOUNTER — Other Ambulatory Visit: Payer: Self-pay | Admitting: Pharmacy Technician

## 2017-07-30 NOTE — Patient Outreach (Signed)
[facility](THN) Care Management  07/30/2017  [name] 10/21/1940 [id number]   Incoming call from patients friend Mitchell ChouJohn [name] with Mr. Mitchell EspyGibson on speaker phone, HIPAA identifiers verified. Patient contacted me to help him fill out the [employer] attestation letter correctly and to verify required items are mailed back into [employer]. Mr. Mitchell CandleCummings stated that he would help patient fax [employer] EOB or OOP into me and requested my fax number.  Will follow up with patient when I have an update from [name] patient assistance in 1-2 weeks.  Mitchell SlickAshley N. [name], CPhT [facility] Care Management [phone]

## 2017-07-30 NOTE — Patient Outreach (Signed)
[facility]) Care Management  07/30/2017  Mitchell [name] [birth year] [id number]   Contacted [facility] to verify co-pay for 30 day supply of Zetia, I was quoted $29.60. I consulted with Rph Nunzio CobbsKatina Boyd in regards to patient requesting help with his medication and she suggested that we can look into pill packaging.  Will follow up with patient  Mitchell [name], CPhT [facility] Care Management [phone]

## 2017-07-30 NOTE — Patient Outreach (Signed)
[facility]N) Care Management  07/30/2017  Mitchell BossierJarald Dean [name] [birth year] [id number]  Successful outreach call to patient, HIPAA identifiers verified. Informed patient that I received his [employer] EOB that was faxed into me. Patient states that he has less than a week of Zetia left. I informed patient that that is one of the drugs that was just approved by [employer] however, it probably wont be there with this week. Patient questioned the price of the drug, I informed him I would have to contact the pharmacy to find out. Patient also questioned if there is anyone who can help with keeping up with his medication, I informed him I could find out details about that as well.  Will follow up with patient  Suzan Slickshley N. Ernesta Ambleoleman, CPhT [facility] Care Management [phone]

## 2017-07-30 NOTE — Patient Outreach (Signed)
[facility]N) Care Management  07/30/2017  Mitchell BossierJarald Dean [name] 1941/01/07 [id number]   Successful outreach call to patient, HIPAA identifiers verified. Informed patient of his co-pay for his Zetia for 30 days being $29.60. Also informed patient that we could review options for pill packaging to help him maintain his medications.  Patient requests that a nurse or pharmacist come sit with him with a list of his medications in large print and tell him specifically how and when to take them.  Patient also requests that I contact Ms. Jeanella AntonReece his ex-wife to verify how many days he has of Eliquis, due to her currently not speaking to him, to see if he needs to request more samples from Dr. Katrinka BlazingSmith  Will route note to Rph Golden PopKatina Boyd  Verl Kitson N. Ernesta Ambleoleman, CPhT [facility] Care Management [phone]

## 2017-08-01 ENCOUNTER — Telehealth: Payer: Self-pay | Admitting: Interventional Cardiology

## 2017-08-01 ENCOUNTER — Telehealth: Payer: Self-pay | Admitting: Pharmacist

## 2017-08-01 NOTE — Patient Outreach (Signed)
[facility] ([facility]) Care Management  08/01/2017  Allie BossierJarald Dean Tedeschi Apr 10, 1941 [id number]   Patient was called to follow up on medication assistance and to [facility] and or pill packs. HIPAA identifiers were obtained. Patient and his ex-wife were on speaker phone and confirmed she is still coming to his home to fill his pill box. However, the patient is interested in filling the box himself.  He has vision issues so the idea of pill boxes was discussed but the patient wants to continue to get his medications through mail order because the tier 1 and 2 medications do not have a copay.  Patient has two weeks left of Eliquis and he has requested some more samples from Dr. Katrinka BlazingSmith.  He also will be going to pick up Ezetimibe from the local pharmacy since it is a tier 3 generic medication.  Patient assistance applications are being processed for the patient for [name] (Zetia, [place], Proventil) and [employer] (Eliquis).  A home visit was schedule for 08/06/17 to discuss and review options and to help the patient figure out how to fill his own box.  [facility], Lilla ShookAshley Coleman.   Beecher McardleKatina J. Ornella Coderre, PharmD, BCACP [facility] Clinical Pharmacist [phone]

## 2017-08-01 NOTE — Telephone Encounter (Signed)
Per other phone note from today, patient has two weeks of eliquis left. Patient is pending patient assistance but this needs to be followed up on either by [facility] or [facility] per PA nurse. It is also mentioned in phone notes that the patient may need to switch to coumadin if he cannot obtain the eliquis. Routing to [employer]hley and Katina with [facility] to address patient assistance with patient.

## 2017-08-01 NOTE — Telephone Encounter (Signed)
New message ° ° ° °Patient calling the office for samples of medication: ° ° °1.  What medication and dosage are you requesting samples for? apixaban (ELIQUIS) 5 MG TABS tablet ° °2.  Are you currently out of this medication? no ° ° °

## 2017-08-02 ENCOUNTER — Ambulatory Visit: Payer: Self-pay | Admitting: Pharmacy Technician

## 2017-08-02 DIAGNOSIS — J449 Chronic obstructive pulmonary disease, unspecified: Secondary | ICD-10-CM | POA: Diagnosis not present

## 2017-08-05 ENCOUNTER — Other Ambulatory Visit: Payer: Self-pay | Admitting: Pharmacy Technician

## 2017-08-05 NOTE — Telephone Encounter (Signed)
**Note De-Identified  Obfuscation** Pt assistance for Eliquis has been approved through [employer]. Approval good until 04/15/2018 Case#[id number]

## 2017-08-05 NOTE — Patient Outreach (Signed)
[facility]N) Care Management  08/05/2017  [name] October 27, 1940 [id number]   Successful outreach to Mitchell FisherBristol [name] to follow up with patients application for his Eliquis. Spoke to Mitchell [name] who confirmed patient was approved on 08/02/17 thru 04/15/18. She also stated that patient should receive in the next 7-10 days.  Mitchell SlickAshley N. [name], CPhT [facility] Care Management [phone]

## 2017-08-05 NOTE — Patient Outreach (Signed)
[facility]) Care Management  08/05/2017  Mitchell BossierJarald Dean [name] [birth year] [id number]   Successful outreach call to [name], HIPAA identifiers confirmed. Verified with patient that [name] contacted him in reference to being approved for Eliquis. He stated they did contact him and gave him the [facility] and suggested he call to verify his account since he is a new patient to them. [phone].  Will follow up with patient once I have an update from [name] on his other application in the next week or 2  Dana Corporationshley N. Ernesta Ambleoleman, CPhT [facility] Care Management [phone]

## 2017-08-06 ENCOUNTER — Other Ambulatory Visit: Payer: Self-pay | Admitting: Pharmacist

## 2017-08-06 NOTE — Patient Outreach (Signed)
[facility]) Care Management  [facility]   08/06/2017  Romualdo Prosise 03-08-1941 [id number]  Subjective: Home visit completed at patient's home. HIPAA identifiers were obtained.  Patient is a 77 year old male with multiple medical conditions including but not limited to: Atrial flutter, CAD, CKD stage III, gout, depression hyperlipidemia, obesity, osteoarthritis of right knee and vision impairment.      Objective:   Encounter Medications: Outpatient Encounter Medications as of 08/06/2017  Medication Sig Note  . acetaminophen (ACETAMINOPHEN EXTRA STRENGTH) 500 MG tablet Take 500 mg by mouth every 6 (six) hours as needed.   Marland Kitchen albuterol (PROVENTIL HFA;VENTOLIN HFA) 108 (90 BASE) MCG/ACT inhaler Inhale 2 puffs into the lungs every 6 (six) hours as needed for wheezing. Reported on 05/04/2015 06/27/2017: Cost  . apixaban (ELIQUIS) 5 MG TABS tablet Take 1 tablet (5 mg total) by mouth 2 (two) times daily.   Marland Kitchen aspirin EC 81 MG tablet Take 1 tablet (81 mg total) by mouth daily.   Marland Kitchen BISACODYL PO Take 1 tablet by mouth daily as needed.   . colchicine 0.6 MG tablet Take 1 tablet (0.6 mg total) by mouth 2 (two) times daily. (Patient not taking: Reported on 06/27/2017)   . diclofenac (VOLTAREN) 75 MG EC tablet 75 mg. Take 2 tablets at the onset of gout then 1 tablet twice daily as needed.   . docusate sodium (COLACE) 100 MG capsule Take 100 mg by mouth daily as needed for mild constipation.   Marland Kitchen ezetimibe (ZETIA) 10 MG tablet Take 10 mg by mouth daily.   . famotidine (PEPCID) 20 MG tablet Take 1 tablet (20 mg total) by mouth 2 (two) times daily. (Patient not taking: Reported on 12/03/2016)   . finasteride (PROSCAR) 5 MG tablet Take 5 mg by mouth daily. Reported on 05/04/2015   . furosemide (LASIX) 20 MG tablet Take 1 tablet (20 mg total) by mouth daily.   . metoprolol tartrate (LOPRESSOR) 25 MG tablet Take 1 tablet (25 mg total) by mouth 2 (two) times daily.   .  mometasone-formoterol (DULERA) 100-5 MCG/ACT AERO Inhale 2 puffs into the lungs 2 (two) times daily. (Patient not taking: Reported on 06/27/2017) 06/27/2017: cost  . Multiple Vitamins-Minerals (EYE VITAMINS & MINERALS PO) Take 1 tablet by mouth daily.   . nitroGLYCERIN (NITROSTAT) 0.4 MG SL tablet Place 0.4 mg under the tongue every 5 (five) minutes as needed. Reported on 05/04/2015   . polyethylene glycol (MIRALAX / GLYCOLAX) packet Take 17 g by mouth 2 (two) times daily. (Patient not taking: Reported on 01/04/2017)   . potassium chloride (KLOR-CON 10) 10 MEQ tablet Take 10 mEq by mouth daily.   . tamsulosin (FLOMAX) 0.4 MG CAPS capsule Take 0.4 mg by mouth daily after breakfast. Reported on 05/04/2015    No facility-administered encounter medications on file as of 08/06/2017.     Functional Status: In your present state of health, do you have any difficulty performing the following activities: 01/05/2017  Hearing? N  Vision? N  Difficulty concentrating or making decisions? N  Walking or climbing stairs? Y  Dressing or bathing? N  Doing errands, shopping? N  Some recent data might be hidden    Fall/Depression Screening: Fall Risk  08/09/2015 07/01/2015 06/06/2015  Falls in the past year? Yes Yes Yes  Number falls in past yr: 1 1 1   Injury with Fall? - - -  Risk for fall due to : - - -  Risk for fall due to:  Comment - - -  Follow up - Falls prevention discussed Falls prevention discussed   PHQ 2/9 Scores 06/14/2017 08/09/2015 07/01/2015 06/06/2015 02/15/2015  PHQ - 2 Score 1 0 0 0 0      Assessment: Patient's medications were reviewed while in his home.    Drugs sorted by system:  Neurologic/Psychologic:  Cardiovascular: Eliquis Aspirin Ezetimibe furosemide Nitroglycerin Metoprolol   Pulmonary/Allergy: Albuterol HFA (patient does not have)--on the way from [employer] Patient Assistance Dulera (on the way from [employer] Patient Assistance)  Gastrointestinal: Bisacodyl Docusate  Sodium  Pain: Acetaminophen Diclofenac   Vitamins/Minerals: Multiple Vitamins and Minerals (Eye Vitamin)  Potassium Chloride  Miscellaneous: Finasteride Tamsulosin   Medication Review Findings:  Adherence/Med list discrepancies:  Potassium-patient did not have any more potassium tablets.  [facility] was called and they were asked to call Dr. Tenny Crawoss' office about refills as the prescription they had on file did not have any more active refills.  Metoprolol-[facility] did not have Metoprolol on their active medication list.  Dr. Michaelle CopasSmith's office will be called and asked to send in a [facility].  Atenolol-[facility] attempted to fill atenolol for the patient. They were asked to void that prescription as the patient reported Dr. Katrinka BlazingSmith discontinued Atenolol.  In addition, the patient is taking metoprolol tartrate.   Furosemide-medication list in the patient's chart lists furosemide 20mg  1 tablet daily patient is taking furosemide 40mg  1 tablet daily.  Interventions/Education Provided: Patient is visually impaired. As such, he was provided with a medication list in very large font organized by time of dosing to help him be able to fill his own pill pack if he needed to.    In addition to being organized by dosing time, the indication of each medication was included as well.  Patient's pill box was inspected.  Spoke with the patient's caregiver about the pill box and discussed the missing potassium.  [facility]ed [facility] to reorder necessary medications.  Called Dr. Michaelle CopasSmith's office to request a new prescription for [facility]rol[facility].   Plan:  Follow up with the patient in 5-7 days to see if he has received his medications. Route note to patient's PCP   Beecher McardleKatina J. Renatha Rosen, PharmD, [facility] MontanaBCACP [facility] Clinical Pharmacist [phone]

## 2017-08-07 ENCOUNTER — Other Ambulatory Visit: Payer: Self-pay | Admitting: *Deleted

## 2017-08-07 MED ORDER — METOPROLOL TARTRATE 25 MG PO TABS: ORAL_TABLET | ORAL | 0 refills | Status: DC

## 2017-08-07 NOTE — Telephone Encounter (Signed)
Dr Smith has never refilled this for the patient. Okay to refill? Please advise. Thanks, MI 

## 2017-08-07 NOTE — Telephone Encounter (Signed)
Ok to fill until time of recall in August.  Please put reminder for pt to call and schedule August appt.  Thanks!

## 2017-08-09 ENCOUNTER — Other Ambulatory Visit: Payer: Self-pay | Admitting: Pharmacy Technician

## 2017-08-09 NOTE — Patient Outreach (Signed)
[facility]N) Care Management  08/09/2017  Mitchell [name] [birth year] [id number]   Successful outreach call to patient in regards to setting his profile up with [name] for delivery of he Eliquis. HIPAA identifiers verified, patient requests I call him back in about an hour due to being in a place where he wasn't able to talk.  Will call him back later this afternoon  Suzan SlickAshley N. Ernesta Ambleoleman, CPhT [facility] Care Management [phone]

## 2017-08-09 NOTE — Patient Outreach (Signed)
[facility]N) Care Management  08/09/2017  Mitchell BossierJarald Dean Yeatman 12-Aug-1940 [id number]  Successful return call to patient, HIPAA identifiers verified. Following with patient to see if he has had anyone help him contact [name] to set up his pharmacy account. He stated he hadn't but someone had called him from the company and told him the medicine would be delivered by [employer] today. I suggested that if he didn't receive the medicaine today or tomorrow that he should call the company on Monday. He stated that he would have his ex wife help him with that if need be.  Will follow up with patient when I have a delivery status update from [name] on his other medications next week.  Suzan SlickAshley N. Ernesta Ambleoleman, CPhT [facility] Care Management [phone]

## 2017-08-12 ENCOUNTER — Other Ambulatory Visit: Payer: Self-pay | Admitting: Pharmacy Technician

## 2017-08-12 NOTE — Patient Outreach (Signed)
[facility]) Care Management  08/12/2017  Mitchell [name] 25-Oct-1940 [id number]   Unsuccessful outreach call to [name], HIPAA compliant voicemail left  [name]. [name] [facility] Care Management [phone]

## 2017-08-12 NOTE — Patient Outreach (Signed)
[facility]) Care Management  08/12/2017  Mitchell [name] [birth year] [id number]   Successful return call to patient, HIPAA identifiers verified. Informed patient that per [facility] ([name]) that [employer], Proventil and Zetia are due to be delivered in the next 7 days. Patient states that he has issues responding to [employer]'s automatic call system.  Will speak to my contact at [facility] to see if there are others ways for them to contact patient.  Will follow up with patient in the next 7-10 days to verify receipt of 3 medications.  [name] [facility] Care Management [phone]

## 2017-08-12 NOTE — Patient Outreach (Signed)
[facility]) Care Management  08/12/2017  Mitchell [name] 1941-01-22 [id number]  Incoming call from [name], HIPAA identifiers verified. Patient stated he is out of him Potassium tablets. Per RPh [name] she requested [facility] mail order contact Dr. Tenny Craw for refills. Patient stated that he received 3 bottles of his Eliquis on Friday from the company.  [name] [facility] Care Management [phone]

## 2017-08-14 ENCOUNTER — Ambulatory Visit: Payer: Self-pay | Admitting: Pharmacist

## 2017-08-19 ENCOUNTER — Encounter: Payer: Self-pay | Admitting: Cardiology

## 2017-08-19 ENCOUNTER — Other Ambulatory Visit: Payer: Self-pay | Admitting: Pharmacy Technician

## 2017-08-19 NOTE — Patient Outreach (Signed)
[facility]) Care Management  08/19/2017  Raye Slyter [birth year] [id number]  Successful outreach call to patient, HIPAA identifiers verified. Contacted patient to verify if he has received his Dulera, Proventil, and Zetia. Patient states that he hasn't so far.  Will follow up with patient on Wednesday if I have not received a call from him.  [name] [facility] Care Management [phone]

## 2017-08-19 NOTE — Progress Notes (Signed)
Cardiology Office Note   Date:  08/20/2017   ID:  Mitchell [name], DOB 1940/09/02, MRN [id number]  PCP:  Daisy Floro, MD  Cardiologist:  Dr. Katrinka Blazing     Chief Complaint  Patient presents with  . Coronary Artery Disease      History of Present Illness: Mitchell [name] is a 77 y.o. male who presents for CAD post hospitalization .  He has a hx of coronary artery disease, COPD, claudication, history of LAD stent,with cutting balloon angioplasty on the LAD diagonal bifurcation and hypertension. COPD is progressed to requirement for home oxygen.  Last seen by Dr. Katrinka Blazing 12/03/16.    Stress test was ordered but not done.  Echo 01/07/17 with EF 55-60%, no significant valvular abnormalities. G1DD second one on 01/10/17 with a fib.   Was hospitalized 12/2016 for Rt arm pain, dizziness, then developed PAFlutter.  Converted on IV dilt.   Discharged on Eliquis with ChadsVASC score of 4.  He was hypotensive with severe dehydration.  His plavix was stopped with addition of eliquis  And lasix decreased to 20 mg daily   Today he continues to have chest pain about twice a week.  He has been out of some meds for a week.  Has some fatigue, if washes his car he has to stop and rest.  He forgot to have nuc stress test done.   Eliquis was $300 per month but now he is on program for this year.  He has trouble on his fixed income obtaining all meds and with exams/tests it is difficult to do everything. His ex wife does help him with arranging his meds. .       Past Medical History:  Diagnosis Date  . Anginal pain (HCC) 01/2012  . Anxiety   . Atherosclerotic heart disease of native coronary artery without angina pectoris   . BPH (benign prostatic hypertrophy)   . CAD (coronary artery disease) 2007   with LAD/Diag CBPTCA and kissing   . CKD (chronic kidney disease), stage III (HCC)    Hattie Perch 01/04/2017  . COPD (chronic obstructive pulmonary disease) (HCC)    "maybe" (02/14/2012)  . Coronary artery  disease   . Depression   . GERD (gastroesophageal reflux disease)   . Hyperlipidemia   . Hypertension   . Hypoxemia   . Myocardial infarction (HCC) 01/22/2012   NSTEMI with DES to RSA (PDA)  . On home oxygen therapy   . Shortness of breath    "walking and laying down sometimes" (02/14/2012)    Past Surgical History:  Procedure Laterality Date  . CATARACT EXTRACTION W/ INTRAOCULAR LENS  IMPLANT, BILATERAL  ~ 2010  . CORONARY ANGIOPLASTY WITH STENT PLACEMENT  05/11/2005   PCI OF LAD  . GANGLION CYST REMOVED  1956?   right  . LEFT HEART CATHETERIZATION WITH CORONARY ANGIOGRAM Bilateral 01/24/2012   Procedure: LEFT HEART CATHETERIZATION WITH CORONARY ANGIOGRAM;  Surgeon: Lesleigh Noe, MD;  Location: [facility] CATH LAB;  Service: Cardiovascular;  Laterality: Bilateral;  . REFRACTIVE SURGERY  2010   left     Current Outpatient Medications  Medication Sig Dispense Refill  . acetaminophen (ACETAMINOPHEN EXTRA STRENGTH) 500 MG tablet Take 500 mg by mouth every 6 (six) hours as needed.    Marland Kitchen albuterol (PROVENTIL HFA;VENTOLIN HFA) 108 (90 BASE) MCG/ACT inhaler Inhale 2 puffs into the lungs every 6 (six) hours as needed for wheezing. Reported on 05/04/2015    . apixaban (ELIQUIS) 5 MG TABS tablet Take  1 tablet (5 mg total) by mouth 2 (two) times daily. 180 tablet 3  . aspirin EC 81 MG tablet Take 1 tablet (81 mg total) by mouth daily. 90 tablet 3  . BISACODYL PO Take 1 tablet by mouth daily as needed.    . diclofenac (VOLTAREN) 75 MG EC tablet 75 mg. Take 2 tablets at the onset of gout then 1 tablet twice daily as needed.    . docusate sodium (COLACE) 100 MG capsule Take 100 mg by mouth daily as needed for mild constipation.    Marland Kitchen ezetimibe (ZETIA) 10 MG tablet Take 10 mg by mouth daily.    . finasteride (PROSCAR) 5 MG tablet Take 5 mg by mouth daily. Reported on 05/04/2015    . furosemide (LASIX) 20 MG tablet Take 1 tablet (20 mg total) by mouth daily. (Patient taking differently: Take 40 mg by  mouth daily. ) 30 tablet 0  . metoprolol tartrate (LOPRESSOR) 25 MG tablet Take 1 tablet by mouth two times a day. Patient needs to call and schedule an appointment with Dr Katrinka Blazing for further refills 1st attempt 180 tablet 0  . mometasone-formoterol (DULERA) 100-5 MCG/ACT AERO Inhale 2 puffs into the lungs 2 (two) times daily. 1 Inhaler 6  . Multiple Vitamins-Minerals (EYE VITAMINS & MINERALS PO) Take 1 tablet by mouth daily.    . nitroGLYCERIN (NITROSTAT) 0.4 MG SL tablet Place 0.4 mg under the tongue every 5 (five) minutes as needed. Reported on 05/04/2015    . potassium chloride (KLOR-CON 10) 10 MEQ tablet Take 10 mEq by mouth daily.    . tamsulosin (FLOMAX) 0.4 MG CAPS capsule Take 0.4 mg by mouth daily after breakfast. Reported on 05/04/2015     No current facility-administered medications for this visit.     Allergies:   Lipitor [atorvastatin] and Statins    Social History:  The patient  reports that he quit smoking about 6 years ago. His smoking use included pipe. He quit after 42.00 years of use. He has never used smokeless tobacco. He reports that he drinks about 3.5 oz of alcohol per week. He reports that he does not use drugs.   Family History:  The patient's family history includes Heart failure in his brother and mother.    ROS:  General:no colds or fevers, + weight gain over the winter.  Skin:no rashes or ulcers HEENT:no blurred vision, no congestion CV:see HPI PUL:see HPI GI:no diarrhea constipation or melena, no indigestion GU:no hematuria, no dysuria MS:no joint pain, + back pain,  no claudication Neuro:no syncope, no lightheadedness Endo:no diabetes, no thyroid disease  Wt Readings from Last 3 Encounters:  08/20/17 217 lb (98.4 kg)  01/12/17 210 lb 12.8 oz (95.6 kg)  12/03/16 215 lb (97.5 kg)     PHYSICAL EXAM: VS:  BP 122/74   Pulse (!) 46   Ht 6' (1.829 m)   Wt 217 lb (98.4 kg)   SpO2 98%   BMI 29.43 kg/m  , BMI Body mass index is 29.43  kg/m. General:Pleasant affect, NAD Skin:Warm and dry, brisk capillary refill HEENT:normocephalic, sclera clear, mucus membranes moist Neck:supple, no JVD, no bruits  Heart:S1S2 RRR -slow without murmur, gallup, rub or click Lungs:clear to diminished without rales, rhonchi, or wheezes AVW:UJWJ, non tender, + BS, do not palpate liver spleen or masses Ext:no lower ext edema, 2+ pedal pulses, 2+ radial pulses Neuro:alert and oriented X 3, MAE, follows commands, + facial symmetry    EKG:  EKG is NOT ordered today.  Recent Labs: 01/10/2017: ALT 30; Hemoglobin 13.6; Magnesium 2.3; Platelets 280; TSH 1.091 01/11/2017: BUN 30; Creatinine, Ser 1.33; Potassium 5.1; Sodium 137    Lipid Panel    Component Value Date/Time   CHOL 168 01/12/2017 0503   TRIG 104 01/12/2017 0503   HDL 34 (L) 01/12/2017 0503   CHOLHDL 4.9 01/12/2017 0503   VLDL 21 01/12/2017 0503   LDLCALC 113 (H) 01/12/2017 0503       Other studies Reviewed: Additional studies/ records that were reviewed today include: . Echo 01/10/17 Study Conclusions  - Procedure narrative: Transthoracic echocardiography. Image   quality was suboptimal. The study was technically difficult, as a   result of poor acoustic windows, poor sound wave transmission,   and body habitus. - Left ventricle: The cavity size was normal. Systolic function was   probably normal. - Right ventricle: The cavity size was mildly dilated. Wall   thickness was normal. - No significant valvular abnormality. Patient appeared to be in A.   Fibrillation. RV is mildly dilated but preserved systolic   function.  ASSESSMENT AND PLAN:  1.  PAFlutter, with hospital admit for dehydration.  Now SB at 46,  + fatigue  Is on Eliquis 5 mg BID.  No bleeding.    2.  CAD with cutting balloon angioplasty on the LAD diagonal bifurcation he also has a history of coronary stent.  Now with chest pain about twice a week.  Will proceed with lexiscan myoview.    3.   Sinus brady rate of 46 will decrease lopressor to 12.5 BID, see if this will help fatigue.  BP is stable.  He has tachybrady syndrome    3.   COPD stable, having difficulty buying inhalers at times.  4.   Essential HTN stable    Current medicines are reviewed with the patient today.  The patient Has no concerns regarding medicines.  The following changes have been made:  See above Labs/ tests ordered today include:see above  Disposition:   FU:  see above  Signed, Nada Boozer, NP  08/20/2017 11:22 AM    [facility] [address] [place], [place], [place]  [address]/ [address] [place], [place] Phone: [phone]; Fax: [phone]  [phone]

## 2017-08-20 ENCOUNTER — Encounter: Payer: Self-pay | Admitting: Cardiology

## 2017-08-20 ENCOUNTER — Ambulatory Visit: Payer: Medicare PPO | Admitting: Cardiology

## 2017-08-20 VITALS — BP 122/74 | HR 46 | Ht 72.0 in | Wt 217.0 lb

## 2017-08-20 DIAGNOSIS — J449 Chronic obstructive pulmonary disease, unspecified: Secondary | ICD-10-CM

## 2017-08-20 DIAGNOSIS — R0789 Other chest pain: Secondary | ICD-10-CM | POA: Diagnosis not present

## 2017-08-20 DIAGNOSIS — I483 Typical atrial flutter: Secondary | ICD-10-CM

## 2017-08-20 DIAGNOSIS — I209 Angina pectoris, unspecified: Secondary | ICD-10-CM

## 2017-08-20 DIAGNOSIS — I25110 Atherosclerotic heart disease of native coronary artery with unstable angina pectoris: Secondary | ICD-10-CM

## 2017-08-20 MED ORDER — EZETIMIBE 10 MG PO TABS: 10.0000 mg | ORAL_TABLET | Freq: Every day | ORAL | 3 refills | Status: DC

## 2017-08-20 MED ORDER — POTASSIUM CHLORIDE ER 10 MEQ PO TBCR
10.0000 meq | EXTENDED_RELEASE_TABLET | Freq: Every day | ORAL | 3 refills | Status: DC
Start: 2017-08-20 — End: 2017-12-25

## 2017-08-20 MED ORDER — METOPROLOL TARTRATE 25 MG PO TABS: 12.5000 mg | ORAL_TABLET | Freq: Two times a day (BID) | ORAL | 3 refills | Status: DC

## 2017-08-20 MED ORDER — FUROSEMIDE 20 MG PO TABS: 20.0000 mg | ORAL_TABLET | Freq: Every day | ORAL | 3 refills | Status: DC

## 2017-08-20 MED ORDER — NITROGLYCERIN 0.4 MG SL SUBL: 0.4000 mg | SUBLINGUAL_TABLET | SUBLINGUAL | 3 refills | Status: DC | PRN

## 2017-08-20 NOTE — Patient Instructions (Signed)
Medication Instructions: Your physician has recommended you make the following change in your medication:   DECREASE : Lasix to 20 mg taking 1 tablet daily    Labwork: None Ordered  Procedures/Testing: Your physician has requested that you have a lexiscan myoview. For further information please visit [email or web address] Please follow instruction sheet, as given.   Follow-Up: Your physician recommends that you schedule a follow-up appointment in: Follow up with Dr.Smith in 2-3 months    Any Additional Special Instructions Will Be Listed Below (If Applicable).     If you need a refill on your cardiac medications before your next appointment, please call your pharmacy.

## 2017-08-22 ENCOUNTER — Other Ambulatory Visit: Payer: Self-pay | Admitting: Pharmacy Technician

## 2017-08-22 NOTE — Patient Outreach (Signed)
[facility]) Care Management  08/22/2017  Vester Balthazor 10-15-1940 [id number]  Incoming call from patient, HIPAA identifiers verified. Patient states that he received his Dulera, Proventil and Zetia. He states he will have [name] (helps with medication) call me if he has any questions about refills or the medications. I informed him  That he would be getting brand name Zetia vs the generic he was receiving from [facility]. He states he understood. He had questions about a fluid pill he is on, in regards to 1 provider to tell him to take it one way vs his heart provider telling him something else. I told him that if he felt uncomfortable changing the way he takes it he needs to inform both providers so that he and them are on the same page.  Will route note to RPh [name] for case closure on Patient assistance.  [name] [facility] Care Management [phone]

## 2017-08-23 ENCOUNTER — Telehealth: Payer: Self-pay | Admitting: Pharmacist

## 2017-08-23 NOTE — Patient Outreach (Signed)
[facility]) Care Management  08/23/2017  Scout Gumbs Apr 21, 1940 [id number]   Patient received Elwin Sleight, Proventil and Zetia from patient assistance programs.  He communicated understanding on how to reorder to [employer]. In addition, patient has my contact information for questions.  [name], PharmD, BCACP [facility] Clinical Pharmacist [phone]

## 2017-08-26 ENCOUNTER — Ambulatory Visit (HOSPITAL_COMMUNITY)
Admission: RE | Admit: 2017-08-26 | Discharge: 2017-08-26 | Disposition: A | Discharge status: Home / Self Care | Payer: Medicare PPO | Source: Ambulatory Visit | Attending: Cardiology | Admitting: Cardiology

## 2017-08-26 DIAGNOSIS — R0789 Other chest pain: Secondary | ICD-10-CM | POA: Insufficient documentation

## 2017-08-26 DIAGNOSIS — R9439 Abnormal result of other cardiovascular function study: Secondary | ICD-10-CM | POA: Diagnosis not present

## 2017-08-26 LAB — NM MYOCAR MULTI W/SPECT W/WALL MOTION / EF
CHL CUP MPHR: 143 {beats}/min
CHL CUP RESTING HR STRESS: 53 {beats}/min
CSEPHR: 55 %
Estimated workload: 1 METS
Exercise duration (min): 6 min
Exercise duration (sec): 54 s
Peak HR: 80 {beats}/min

## 2017-08-26 MED ORDER — TECHNETIUM TC 99M TETROFOSMIN IV KIT
10.0000 | PACK | Freq: Once | INTRAVENOUS | Status: AC | PRN
  Administered 2017-08-26: 10 via INTRAVENOUS

## 2017-08-26 MED ORDER — REGADENOSON 0.4 MG/5ML IV SOLN
INTRAVENOUS | Status: AC
  Administered 2017-08-26: 0.4 mg
  Filled 2017-08-26: qty 5

## 2017-08-26 MED ORDER — REGADENOSON 0.4 MG/5ML IV SOLN: 0.4000 mg | Freq: Once | INTRAVENOUS | Status: DC

## 2017-08-26 MED ORDER — TECHNETIUM TC 99M TETROFOSMIN IV KIT
30.0000 | PACK | Freq: Once | INTRAVENOUS | Status: AC | PRN
  Administered 2017-08-26: 30 via INTRAVENOUS

## 2017-08-26 NOTE — Progress Notes (Signed)
   Mitchell [name] presented for a nuclear stress test today.  No immediate complications.  Stress imaging is pending at this time.  Preliminary EKG findings may be listed in the chart, but the stress test result will not be finalized until perfusion imaging is complete.  1 day study, [facility] to read.  Theodore Demark, PA-C 08/26/2017, 11:53 AM

## 2017-09-01 DIAGNOSIS — J449 Chronic obstructive pulmonary disease, unspecified: Secondary | ICD-10-CM | POA: Diagnosis not present

## 2017-10-02 DIAGNOSIS — J449 Chronic obstructive pulmonary disease, unspecified: Secondary | ICD-10-CM | POA: Diagnosis not present

## 2017-10-14 ENCOUNTER — Other Ambulatory Visit: Payer: Self-pay | Admitting: Physician Assistant

## 2017-10-14 ENCOUNTER — Other Ambulatory Visit: Payer: Self-pay | Admitting: Interventional Cardiology

## 2017-11-01 DIAGNOSIS — J449 Chronic obstructive pulmonary disease, unspecified: Secondary | ICD-10-CM | POA: Diagnosis not present

## 2017-12-02 DIAGNOSIS — J449 Chronic obstructive pulmonary disease, unspecified: Secondary | ICD-10-CM | POA: Diagnosis not present

## 2017-12-12 DIAGNOSIS — Z7982 Long term (current) use of aspirin: Secondary | ICD-10-CM | POA: Diagnosis not present

## 2017-12-12 DIAGNOSIS — Z9981 Dependence on supplemental oxygen: Secondary | ICD-10-CM | POA: Diagnosis not present

## 2017-12-24 NOTE — Progress Notes (Signed)
Cardiology Office Note:    Date:  12/25/2017   ID:  Mitchell [name], DOB 02/24/1941, MRN [id number]  PCP:  Lawerance Cruel, MD  Cardiologist:  Sinclair Grooms, MD   Referring MD: Lawerance Cruel, MD   Chief Complaint  Patient presents with  . Coronary Artery Disease    History of Present Illness:    Mitchell [name] is a 77 y.o. male with a hx of coronary artery disease with remote LAD/Diagonal cutting balloon PCI, history of RCA?PDA DES, COPD, claudication and hypertension. COPD has progressed to requirement for home oxygen.  He is doing well.  He denies angina.  He has not had syncope.  No nitroglycerin use.  He denies lower extremity swelling.  He is limited by dyspnea due to COPD.  He has continuous oxygen requirement although he did not bring it with him today and is ambulating without great difficulty.  A significant development is identifying that he has an older son that he has never met.  The son tracked him down and is coming to meet him this weekend.  The son lives in [place] and wants him to travel by plane to [facility] to meet his entire family.  [name] is concerned about whether or not he is fit enough to be able to make an airplane flight.  He had a nuclear myocardial perfusion study performed within the last 6 months which was low risk.  Past Medical History:  Diagnosis Date  . Anginal pain ([place]) 01/2012  . Anxiety   . Atherosclerotic heart disease of native coronary artery without angina pectoris   . BPH (benign prostatic hypertrophy)   . CAD (coronary artery disease) 2007   with LAD/Diag CBPTCA and kissing   . CKD (chronic kidney disease), stage III ([place])    Archie Endo 01/04/2017  . COPD (chronic obstructive pulmonary disease) ([place])    "maybe" (02/14/2012)  . Coronary artery disease   . Depression   . GERD (gastroesophageal reflux disease)   . Hyperlipidemia   . Hypertension   . Hypoxemia   . Myocardial infarction ([place]) 01/22/2012   NSTEMI with  DES to RSA (PDA)  . On home oxygen therapy   . Shortness of breath    "walking and laying down sometimes" (02/14/2012)    Past Surgical History:  Procedure Laterality Date  . CATARACT EXTRACTION W/ INTRAOCULAR LENS  IMPLANT, BILATERAL  ~ 2010  . CORONARY ANGIOPLASTY WITH STENT PLACEMENT  05/11/2005   PCI OF LAD  . GANGLION CYST REMOVED  1956?   right  . LEFT HEART CATHETERIZATION WITH CORONARY ANGIOGRAM Bilateral 01/24/2012   Procedure: LEFT HEART CATHETERIZATION WITH CORONARY ANGIOGRAM;  Surgeon: Sinclair Grooms, MD;  Location: [facility] CATH LAB;  Service: Cardiovascular;  Laterality: Bilateral;  . REFRACTIVE SURGERY  2010   left    Current Medications: Current Meds  Medication Sig  . acetaminophen (ACETAMINOPHEN EXTRA STRENGTH) 500 MG tablet Take 500 mg by mouth every 6 (six) hours as needed.  Marland Kitchen albuterol (PROVENTIL HFA;VENTOLIN HFA) 108 (90 BASE) MCG/ACT inhaler Inhale 2 puffs into the lungs every 6 (six) hours as needed for wheezing. Reported on 05/04/2015  . apixaban (ELIQUIS) 5 MG TABS tablet Take 1 tablet (5 mg total) by mouth 2 (two) times daily.  Marland Kitchen aspirin EC 81 MG tablet Take 1 tablet (81 mg total) by mouth daily.  Marland Kitchen BISACODYL PO Take 1 tablet by mouth daily as needed.  . diclofenac (VOLTAREN) 75 MG EC  tablet 75 mg. Take 2 tablets at the onset of gout then 1 tablet twice daily as needed.  . ezetimibe (ZETIA) 10 MG tablet Take 1 tablet (10 mg total) by mouth daily.  . finasteride (PROSCAR) 5 MG tablet Take 5 mg by mouth daily. Reported on 05/04/2015  . metoprolol tartrate (LOPRESSOR) 25 MG tablet TAKE 1 TABLET TWO TIMES A DAY. PATIENT NEEDS TO CALL AND SCHEDULE AN APPOINTMENT WITH DR Tamala Julian FOR FURTHER REFILLS   . mometasone-formoterol (DULERA) 100-5 MCG/ACT AERO Inhale 2 puffs into the lungs 2 (two) times daily.  . Multiple Vitamins-Minerals (EYE VITAMINS & MINERALS PO) Take 1 tablet by mouth daily.  . nitroGLYCERIN (NITROSTAT) 0.4 MG SL tablet Place 1 tablet (0.4 mg total) under  the tongue every 5 (five) minutes as needed. Reported on 05/04/2015  . potassium chloride (KLOR-CON 10) 10 MEQ tablet Take 1 tablet (10 mEq total) by mouth daily.  . tamsulosin (FLOMAX) 0.4 MG CAPS capsule Take 0.4 mg by mouth daily after breakfast. Reported on 05/04/2015     Allergies:   Lipitor [atorvastatin] and Statins   Social History   Socioeconomic History  . Marital status: Single    Spouse name: Not on file  . Number of children: 3  . Years of education: Not on file  . Highest education level: Not on file  Occupational History  . Not on file  Social Needs  . Financial resource strain: Not on file  . Food insecurity:    Worry: Not on file    Inability: Not on file  . Transportation needs:    Medical: Not on file    Non-medical: Not on file  Tobacco Use  . Smoking status: Former Smoker    Years: 42.00    Types: Pipe    Last attempt to quit: 04/28/2011    Years since quitting: 6.6  . Smokeless tobacco: Never Used  Substance and Sexual Activity  . Alcohol use: Yes    Alcohol/week: 7.0 standard drinks    Types: 7 Standard drinks or equivalent per week    Comment: 02/14/2012 "weekends I have 6-7 mixed drinks"     occasional  . Drug use: No  . Sexual activity: Yes  Lifestyle  . Physical activity:    Days per week: Not on file    Minutes per session: Not on file  . Stress: Not on file  Relationships  . Social connections:    Talks on phone: Not on file    Gets together: Not on file    Attends religious service: Not on file    Active member of club or organization: Not on file    Attends meetings of clubs or organizations: Not on file    Relationship status: Not on file  Other Topics Concern  . Not on file  Social History Narrative   Truck driver        Family History: The patient's family history includes Heart failure in his brother and mother.  ROS:   Please see the history of present illness.    Chronic stable dyspnea on exertion.  Hearing loss,  dyspnea on exertion, no orthopnea, easy bruising, anxiety, leg pain, and some weight loss.  Appetite is been stable.  All other systems reviewed and are negative.  EKGs/Labs/Other Studies Reviewed:    The following studies were reviewed today:  NUCLEAR IMAGING 08/26/2017:  There was no ST segment deviation noted during stress.  No T wave inversion was noted during stress.  Defect 1:  There is a large defect of moderate severity.  This is a low risk study.  Nuclear stress EF: 61%.   Large size, moderate intensity fixed inferior, inferoseptal and septal perfusion defect, likely attenuation artifact with adjacent bowel uptake. LVEF 61% with normal wall motion. This is a low risk study.     EKG:  EKG is  ordered today.  The ekg ordered today demonstrates sinus bradycardia, 49 bpm, otherwise normal.  When compared to prior tracings, fibrillation/SVT has resolved.  Recent Labs: 01/10/2017: ALT 30; Hemoglobin 13.6; Magnesium 2.3; Platelets 280; TSH 1.091 01/11/2017: BUN 30; Creatinine, Ser 1.33; Potassium 5.1; Sodium 137  Recent Lipid Panel    Component Value Date/Time   CHOL 168 01/12/2017 0503   TRIG 104 01/12/2017 0503   HDL 34 (L) 01/12/2017 0503   CHOLHDL 4.9 01/12/2017 0503   VLDL 21 01/12/2017 0503   LDLCALC 113 (H) 01/12/2017 0503    Physical Exam:    VS:  BP 120/70   Pulse (!) 49   Ht 6' (1.829 m)   Wt 214 lb 12.8 oz (97.4 kg)   BMI 29.13 kg/m     Wt Readings from Last 3 Encounters:  12/25/17 214 lb 12.8 oz (97.4 kg)  08/20/17 217 lb (98.4 kg)  01/12/17 210 lb 12.8 oz (95.6 kg)     GEN: Elderly.  Well nourished, well developed in no acute distress HEENT: Normal NECK: No JVD. LYMPHATICS: No lymphadenopathy CARDIAC: RRR, no murmur, no gallop, no edema. VASCULAR: 2+ radial bilateral pulses.  No bruits. RESPIRATORY:  Clear to auscultation without rales, wheezing or rhonchi  ABDOMEN: Soft, non-tender, non-distended, No pulsatile mass, MUSCULOSKELETAL: No  deformity  SKIN: Warm and dry NEUROLOGIC:  Alert and oriented x 3 PSYCHIATRIC:  Normal affect   ASSESSMENT:    1. Coronary artery disease involving native coronary artery of native heart with angina pectoris ([place])   2. Atypical atrial flutter (HCC)   3. Essential hypertension   4. Chronic obstructive pulmonary disease, unspecified COPD type ([place])   5. Pure hypercholesterolemia    PLAN:    In order of problems listed above:  1. Stable without anginal symptoms.  Recent low risk nuclear study within the past 6 months.  No reason not to go on a plane flight but he needs to make sure her oxygen will be available.  He should not walk through the airports but be [place] by wheelchair or transport vehicle. 2. No recurrence based on clinical exam.  Anticoagulation therapy without bleeding 3. Excellent current blood pressure control 4. Were oxygen is much as possible. 5. Last LDL within the previous 6 months was 138.  History of statin intolerance with muscle aching and pain.  Start rosuvastatin 5 mg on Monday and Thursday.  Lipid panel in 1 month.  Lipid clinic for further help with achieving a guideline directed LDL less than 70.  Notify us immediately if muscle aching or pain.  Low threshold to stop the medication if he feels he is having problems.  Team APP follow-up 6 months follow-up with me in 1 year.   Medication Adjustments/Labs and Tests Ordered: Current medicines are reviewed at length with the patient today.  Concerns regarding medicines are outlined above.  Orders Placed This Encounter  Procedures  . EKG 12-Lead   No orders of the defined types were placed in this encounter.   There are no Patient Instructions on file for this visit.   Signed, Sinclair Grooms, MD  12/25/2017 10:21 AM  [facility]

## 2017-12-25 ENCOUNTER — Other Ambulatory Visit: Payer: Self-pay | Admitting: Interventional Cardiology

## 2017-12-25 ENCOUNTER — Encounter: Payer: Self-pay | Admitting: Interventional Cardiology

## 2017-12-25 ENCOUNTER — Ambulatory Visit: Payer: Medicare PPO | Admitting: Interventional Cardiology

## 2017-12-25 VITALS — BP 120/70 | HR 49 | Ht 72.0 in | Wt 214.8 lb

## 2017-12-25 DIAGNOSIS — I10 Essential (primary) hypertension: Secondary | ICD-10-CM | POA: Diagnosis not present

## 2017-12-25 DIAGNOSIS — J449 Chronic obstructive pulmonary disease, unspecified: Secondary | ICD-10-CM | POA: Diagnosis not present

## 2017-12-25 DIAGNOSIS — I484 Atypical atrial flutter: Secondary | ICD-10-CM

## 2017-12-25 DIAGNOSIS — E7800 Pure hypercholesterolemia, unspecified: Secondary | ICD-10-CM

## 2017-12-25 DIAGNOSIS — I25119 Atherosclerotic heart disease of native coronary artery with unspecified angina pectoris: Secondary | ICD-10-CM

## 2017-12-25 MED ORDER — POTASSIUM CHLORIDE ER 10 MEQ PO TBCR: 10.0000 meq | EXTENDED_RELEASE_TABLET | Freq: Every day | ORAL | 3 refills | Status: DC

## 2017-12-25 MED ORDER — ROSUVASTATIN CALCIUM 5 MG PO TABS: ORAL_TABLET | ORAL | 6 refills | Status: DC

## 2017-12-25 NOTE — Patient Instructions (Addendum)
Medication Instructions:  Your physician has recommended you make the following change in your medication: Start Rosuvastatin 5 mg by mouth on Monday and Thursday   Labwork: Your physician recommends that you return for lab work in: about 6 weeks.  (prior to appointment in lipid clinic). Lipid and liver profiles. This will be fasting   Testing/Procedures: none  Follow-Up: You have been referred to the lipid clinic in the [facility] office.  Please schedule patient for appointment in about 6 weeks (few days after lab work done)  Your physician wants you to follow-up in:6 months with APP and 12 months with Dr. Marlou Starks will receive a reminder letter in the mail two months in advance. If you don't receive a letter, please call our office to schedule the follow-up appointment.   Any Other Special Instructions Will Be Listed Below (If Applicable).     If you need a refill on your cardiac medications before your next appointment, please call your pharmacy.

## 2017-12-27 ENCOUNTER — Telehealth: Payer: Self-pay | Admitting: Interventional Cardiology

## 2017-12-27 NOTE — Telephone Encounter (Signed)
Pt requested records. Called him this am made him aware ready for pick up.

## 2018-01-02 DIAGNOSIS — J449 Chronic obstructive pulmonary disease, unspecified: Secondary | ICD-10-CM | POA: Diagnosis not present

## 2018-01-13 DIAGNOSIS — H01001 Unspecified blepharitis right upper eyelid: Secondary | ICD-10-CM | POA: Diagnosis not present

## 2018-01-13 DIAGNOSIS — H353134 Nonexudative age-related macular degeneration, bilateral, advanced atrophic with subfoveal involvement: Secondary | ICD-10-CM | POA: Diagnosis not present

## 2018-01-13 DIAGNOSIS — H01002 Unspecified blepharitis right lower eyelid: Secondary | ICD-10-CM | POA: Diagnosis not present

## 2018-01-13 DIAGNOSIS — Z961 Presence of intraocular lens: Secondary | ICD-10-CM | POA: Diagnosis not present

## 2018-01-17 DIAGNOSIS — M179 Osteoarthritis of knee, unspecified: Secondary | ICD-10-CM | POA: Diagnosis not present

## 2018-01-17 DIAGNOSIS — H547 Unspecified visual loss: Secondary | ICD-10-CM | POA: Diagnosis not present

## 2018-01-17 DIAGNOSIS — Z23 Encounter for immunization: Secondary | ICD-10-CM | POA: Diagnosis not present

## 2018-01-20 ENCOUNTER — Other Ambulatory Visit: Payer: Self-pay | Admitting: Pharmacist

## 2018-01-20 NOTE — Patient Outreach (Signed)
[facility]) Care Management  01/20/2018  Mitchell [name] Feb 02, 1941 [id number]   Patient called and requested a refill on Eliquis.  HIPAA identifiers were obtained. Patient has been receiving Eliquis from [facility] patient assistance program.  He did not remember how to reorder. Patient was provided the [employer] phone number to order a refill. In addition, this note will be routed to [facility] Certified Pharmacy Technician, [name] to follow up with the patient.  [name], PharmD, BCACP [facility] Clinical Pharmacist [phone]

## 2018-02-01 DIAGNOSIS — J449 Chronic obstructive pulmonary disease, unspecified: Secondary | ICD-10-CM | POA: Diagnosis not present

## 2018-02-04 DIAGNOSIS — Z0000 Encounter for general adult medical examination without abnormal findings: Secondary | ICD-10-CM | POA: Diagnosis not present

## 2018-02-05 ENCOUNTER — Other Ambulatory Visit: Payer: Medicare PPO | Admitting: *Deleted

## 2018-02-05 DIAGNOSIS — I25119 Atherosclerotic heart disease of native coronary artery with unspecified angina pectoris: Secondary | ICD-10-CM | POA: Diagnosis not present

## 2018-02-05 DIAGNOSIS — E7800 Pure hypercholesterolemia, unspecified: Secondary | ICD-10-CM

## 2018-02-05 LAB — LIPID PANEL
CHOL/HDL RATIO: 3.1 ratio (ref 0.0–5.0)
CHOLESTEROL TOTAL: 167 mg/dL (ref 100–199)
HDL: 54 mg/dL (ref >39)
LDL Calculated: 96 mg/dL (ref 0–99)
TRIGLYCERIDES: 83 mg/dL (ref 0–149)
VLDL Cholesterol Cal: 17 mg/dL (ref 5–40)

## 2018-02-05 LAB — HEPATIC FUNCTION PANEL
ALK PHOS: 75 IU/L (ref 39–117)
ALT: 19 IU/L (ref 0–44)
AST: 15 IU/L (ref 0–40)
Albumin: 3.7 g/dL (ref 3.5–4.8)
Bilirubin Total: 0.4 mg/dL (ref 0.0–1.2)
Bilirubin, Direct: 0.14 mg/dL (ref 0.00–0.40)
Total Protein: 5.5 g/dL — ABNORMAL LOW (ref 6.0–8.5)

## 2018-02-06 ENCOUNTER — Telehealth: Payer: Self-pay | Admitting: *Deleted

## 2018-02-06 DIAGNOSIS — E7800 Pure hypercholesterolemia, unspecified: Secondary | ICD-10-CM

## 2018-02-06 DIAGNOSIS — I25119 Atherosclerotic heart disease of native coronary artery with unspecified angina pectoris: Secondary | ICD-10-CM

## 2018-02-06 NOTE — Telephone Encounter (Signed)
Called and made pt aware of his lab results. Pt verbalized understanding.

## 2018-02-06 NOTE — Telephone Encounter (Signed)
-----   Message from Lyn Records, MD sent at 02/05/2018  8:00 PM EDT ----- Let the patient know lipids are great. Recheck in 1 year. A copy will be sent to Daisy Floro, MD

## 2018-02-11 ENCOUNTER — Encounter: Payer: Self-pay | Admitting: Pharmacist

## 2018-02-11 ENCOUNTER — Ambulatory Visit (INDEPENDENT_AMBULATORY_CARE_PROVIDER_SITE_OTHER): Payer: Medicare PPO | Admitting: Pharmacist

## 2018-02-11 DIAGNOSIS — E7800 Pure hypercholesterolemia, unspecified: Secondary | ICD-10-CM

## 2018-02-11 MED ORDER — ROSUVASTATIN CALCIUM 5 MG PO TABS: ORAL_TABLET | ORAL | 3 refills | Status: DC

## 2018-02-11 NOTE — Patient Instructions (Addendum)
INCREASE your rosuvastatin (CRESTOR) to 5mg  THREE TIMES WEEKLY  (Monday Wednesday and Friday)   We will recheck your cholesterol with your follow up with Norma Fredrickson, NP.  Please call the clinic at [phone] if you have any issues.

## 2018-02-11 NOTE — Progress Notes (Signed)
Patient ID: Mitchell [name]                 DOB: 16-Jul-1940                    MRN: [id number]     HPI: Mitchell [name] is a 77 y.o. male patient of Dr. Katrinka Blazing that presents today for lipid evaluation.  PMH includes coronary artery disease with remote LAD/Diagonal cutting balloon PCI, history of RCA?PDA DES, COPD, claudication and hypertension. He has been taking rosuvastatin 5mg  twice weekly and ezetimibe.   He presents today for discussion of cholesterol. He states that he has been tolerating his current therapies well. He states he has never tried increasing the dose of rosuvastatin, but that he did not do well on atorvastatin due to 'locked joints.' He states that he has good days and bad days overall. He states that his chest hurts bad or has headache or feels bad and that is what makes his days bad. He compares this to an old car. It is difficult for him to see and his ex-wife manages his medications for him.   Risk Factors: CAD LDL Goal: <70  Current Medications: rosuvastatin 5mg  on Mon and Thurs, ezetimibe 10mg  daily Intolerances: atorvastatin 10mg  and 80mg  daily (locked joints up)  Diet: He eats most meals from home. He prepares most of his food, but does not see well to prepare it.   Exercise: He does not exercise regularly.   Family History: Heart failure in his brother and mother.  Social History: former smoker, occasional alcohol  Labs: 02/05/18: TC 167, TG 83, HDL 54, LDL 96 (rovuastatin 5mg  Mon and Thurs, ezetimibe 10mg  daily)  Past Medical History:  Diagnosis Date  . Anginal pain (HCC) 01/2012  . Anxiety   . Atherosclerotic heart disease of native coronary artery without angina pectoris   . BPH (benign prostatic hypertrophy)   . CAD (coronary artery disease) 2007   with LAD/Diag CBPTCA and kissing   . CKD (chronic kidney disease), stage III (HCC)    Hattie Perch 01/04/2017  . COPD (chronic obstructive pulmonary disease) (HCC)    "maybe" (02/14/2012)  . Coronary  artery disease   . Depression   . GERD (gastroesophageal reflux disease)   . Hyperlipidemia   . Hypertension   . Hypoxemia   . Myocardial infarction (HCC) 01/22/2012   NSTEMI with DES to RSA (PDA)  . On home oxygen therapy   . Shortness of breath    "walking and laying down sometimes" (02/14/2012)    Current Outpatient Medications on File Prior to Visit  Medication Sig Dispense Refill  . acetaminophen (ACETAMINOPHEN EXTRA STRENGTH) 500 MG tablet Take 500 mg by mouth every 6 (six) hours as needed.    Marland Kitchen albuterol (PROVENTIL HFA;VENTOLIN HFA) 108 (90 BASE) MCG/ACT inhaler Inhale 2 puffs into the lungs every 6 (six) hours as needed for wheezing. Reported on 05/04/2015    . apixaban (ELIQUIS) 5 MG TABS tablet Take 1 tablet (5 mg total) by mouth 2 (two) times daily. 180 tablet 3  . aspirin EC 81 MG tablet Take 1 tablet (81 mg total) by mouth daily. 90 tablet 3  . BISACODYL PO Take 1 tablet by mouth daily as needed.    . diclofenac (VOLTAREN) 75 MG EC tablet 75 mg. Take 2 tablets at the onset of gout then 1 tablet twice daily as needed.    . ezetimibe (ZETIA) 10 MG tablet Take 1 tablet (10 mg total) by  mouth daily. 90 tablet 3  . finasteride (PROSCAR) 5 MG tablet Take 5 mg by mouth daily. Reported on 05/04/2015    . metoprolol tartrate (LOPRESSOR) 25 MG tablet TAKE 1 TABLET TWO TIMES A DAY. PATIENT NEEDS TO CALL AND SCHEDULE AN APPOINTMENT WITH DR Katrinka Blazing FOR FURTHER REFILLS  180 tablet 3  . mometasone-formoterol (DULERA) 100-5 MCG/ACT AERO Inhale 2 puffs into the lungs 2 (two) times daily. 1 Inhaler 6  . Multiple Vitamins-Minerals (EYE VITAMINS & MINERALS PO) Take 1 tablet by mouth daily.    . nitroGLYCERIN (NITROSTAT) 0.4 MG SL tablet Place 1 tablet (0.4 mg total) under the tongue every 5 (five) minutes as needed. Reported on 05/04/2015 25 tablet 3  . potassium chloride (KLOR-CON 10) 10 MEQ tablet Take 1 tablet (10 mEq total) by mouth daily. 90 tablet 3  . rosuvastatin (CRESTOR) 5 MG tablet Take one  tablet by mouth on Monday and Thursday 10 tablet 6  . tamsulosin (FLOMAX) 0.4 MG CAPS capsule Take 0.4 mg by mouth daily after breakfast. Reported on 05/04/2015     No current facility-administered medications on file prior to visit.     Allergies  Allergen Reactions  . Lipitor [Atorvastatin] Other (See Comments)    "just paralyzed me; I can't take it"  . Statins Other (See Comments)    "paralyzed me; I can't take it"    Assessment/Plan: Hyperlipidemia: LDL is not at goal 70mg /dL. Discussed options and pt does not wish to try an injection as he is confident that he would not be able to administer to himself. He is willing to increase his dose of rosuvastatin to three times weekly. He believes much more of an increase will result in intolerance. Advised to call if symptoms become intolerable. Will repeat lipid panel with follow up PA visit as it is difficult for pt to get to appts.    Thank you,  Mitchell [name]. Mitchell [name], PharmD  [facility]  02/11/2018 6:59 AM

## 2018-02-17 ENCOUNTER — Telehealth: Payer: Self-pay | Admitting: Pharmacist

## 2018-02-17 ENCOUNTER — Other Ambulatory Visit: Payer: Self-pay

## 2018-02-17 DIAGNOSIS — N400 Enlarged prostate without lower urinary tract symptoms: Secondary | ICD-10-CM | POA: Diagnosis not present

## 2018-02-17 DIAGNOSIS — M25569 Pain in unspecified knee: Secondary | ICD-10-CM | POA: Diagnosis not present

## 2018-02-17 DIAGNOSIS — I10 Essential (primary) hypertension: Secondary | ICD-10-CM | POA: Diagnosis not present

## 2018-02-17 DIAGNOSIS — Z0000 Encounter for general adult medical examination without abnormal findings: Secondary | ICD-10-CM | POA: Diagnosis not present

## 2018-02-17 DIAGNOSIS — M109 Gout, unspecified: Secondary | ICD-10-CM | POA: Diagnosis not present

## 2018-02-17 DIAGNOSIS — H547 Unspecified visual loss: Secondary | ICD-10-CM | POA: Diagnosis not present

## 2018-02-17 DIAGNOSIS — I2510 Atherosclerotic heart disease of native coronary artery without angina pectoris: Secondary | ICD-10-CM | POA: Diagnosis not present

## 2018-02-17 DIAGNOSIS — J449 Chronic obstructive pulmonary disease, unspecified: Secondary | ICD-10-CM | POA: Diagnosis not present

## 2018-02-17 DIAGNOSIS — E7800 Pure hypercholesterolemia, unspecified: Secondary | ICD-10-CM | POA: Diagnosis not present

## 2018-02-17 NOTE — Patient Outreach (Signed)
[name]) Care Management  [facility]   02/17/2018  Mitchell [name] [birth year] [id number]   Reason for referral: help with reordering medications  Referral source: Dr. Gildardo Cranker Current insurance:[employer]   PMHx: Atrial flutter, CAD, CKD stage III, gout, depression hyperlipidemia, obesity, osteoarthritis of right knee and vision impairment.    Objective: Lab Results  Component Value Date   CREATININE 1.33 (H) 01/11/2017   CREATININE 1.36 (H) 01/10/2017   CREATININE 1.27 (H) 01/08/2017    Lab Results  Component Value Date   HGBA1C 6.0 (H) 05/12/2016  (Pre-Diabetes)  Lipid Panel     Component Value Date/Time   CHOL 167 02/05/2018 0744   TRIG 83 02/05/2018 0744   HDL 54 02/05/2018 0744   CHOLHDL 3.1 02/05/2018 0744   CHOLHDL 4.9 01/12/2017 0503   VLDL 21 01/12/2017 0503   LDLCALC 96 02/05/2018 0744    BP Readings from Last 3 Encounters:  12/25/17 120/70  08/26/17 130/61  08/20/17 122/74    Allergies  Allergen Reactions  . Lipitor [Atorvastatin] Other (See Comments)    "just paralyzed me; I can't take it"  . Statins Other (See Comments)    "paralyzed me; I can't take it"    Medications Reviewed Today    Reviewed by Mitchell [name], [name] (Pharmacist) on 02/11/18 at 1602  Med List Status: <None>  Medication Order Taking? Sig Documenting Provider Last Dose Status Informant  acetaminophen (ACETAMINOPHEN EXTRA STRENGTH) 500 MG tablet [id number] Yes Take 500 mg by mouth every 6 (six) hours as needed. [provider] Taking Active   albuterol (PROVENTIL HFA;VENTOLIN HFA) 108 (90 BASE) MCG/ACT inhaler [id number] Yes Inhale 2 puffs into the lungs every 6 (six) hours as needed for wheezing. Reported on 05/04/2015 [provider] Taking Active Pharmacy [name]           Med Note Mitchell [name] Jun 27, 2017 10:44 AM) Cost  apixaban (ELIQUIS) 5 MG TABS tablet [id number] Yes Take 1 tablet (5 mg total) by mouth 2 (two)  times daily. Mitchell Records, MD Taking Active   aspirin EC 81 MG tablet [id number] Yes Take 1 tablet (81 mg total) by mouth daily. Mitchell Records, MD Taking Active Pharmacy [name]  BISACODYL PO [id number] Yes Take 1 tablet by mouth daily as needed. [provider] Taking Active   diclofenac (VOLTAREN) 75 MG EC tablet [id number] Yes 75 mg. Take 2 tablets at the onset of gout then 1 tablet twice daily as needed. [provider] Taking Active   ezetimibe (ZETIA) 10 MG tablet [id number] Yes Take 1 tablet (10 mg total) by mouth daily. Mitchell Brand, NP Taking Active   finasteride (PROSCAR) 5 MG tablet [id number] Yes Take 5 mg by mouth daily. Reported on 05/04/2015 [provider] Taking Active Pharm[name]  furosemide (LASIX) 20 MG tablet [id number] Yes Take 20 mg by mouth daily. [provider] Taking Active   metoprolol tartrate (LOPRESSOR) 25 MG tablet [id number] Yes TAKE 1 TABLET TWO TIMES A DAY. PATIENT NEEDS TO CALL AND SCHEDULE AN APPOINTMENT WITH DR Katrinka Blazing FOR FURTHER REFILLS  Mitchell Brand, NP Taking Active   mometasone-formoterol [facility]) 100-5 MCG/ACT Mitchell [name] [id number] Yes Inhale 2 puffs into the lungs 2 (two) times daily. Mitchell Milch, MD Taking Active Pharmacy [name]           Med Note Mitchell [name] Jun 27, 2017 10:46 AM) cost  Multiple Vitamins-Minerals (EYE VITAMINS &  MINERALS PO) [id number] Yes Take 1 tablet by mouth daily. [provider] Taking Active   nitroGLYCERIN (NITROSTAT) 0.4 MG SL tablet [id number] Yes Place 1 tablet (0.4 mg total) under the tongue every 5 (five) minutes as needed. Reported on 05/04/2015 Mitchell Brand, NP Taking Active   potassium chloride (KLOR-CON 10) 10 MEQ tablet [id number] Yes Take 1 tablet (10 mEq total) by mouth daily. Mitchell Records, MD Taking Active   rosuvastatin (CRESTOR) 5 MG tablet [id number] Yes Take one tablet by mouth on Monday, Wednesday and Friday and increase as tolerated. Mitchell Records, MD   Active   tamsulosin [facility]) 0.4 MG CAPS capsule [id number] Yes Take 0.4 mg by mouth daily after breakfast. Reported on 05/04/2015 [provider] Taking Active Pharmacy [name]           Med Note Mitchell [name] Jan 31, 2015  3:42 AM)            Assessment:  Drugs sorted by system:  Cardiovascular: Aspirin, Eliquis, Furosemide, Metoprolol, Ezetimibe, Rosuvastatin,  Nitroglycerin,   Pulmonary/Allergy: Proventil HFA, Mitchell [name],  Gastrointestinal: Bisacodyl  Pain: Acetaminophen, Diclofenac,   Genitourinary: Finasteride, Tamsulosin  Vitamins/Minerals/Supplements: Multiple Vitamin, Potassium Chloride,    Medication Assistance Findings:  Extra Help:   []  Already receiving Full Extra Help  []  Already receiving Partial Extra Help  []  Eligible based on reported income and assets  [x]  Not Eligible based on reported income and assets  Patient Assistance Programs:   Eliquis- receiving from [employer], Proventil and Zetia-receiving from [employer]  Interventions: Mitchell [name] on the patient's behalf. Provided the patient with the number to call as well: [employer] reorder phone number: [phone]  The following medications were ready to be filled and mailed to the patient at the time of my phone call:  Diclofenac Sodium Finasteride Metoprolol Potassium CL Rosuvastatin Tamsulosin  Patient is receiving Eliqus, Mitchell [name], Proventil and Zeta  With patient on conference call, [employer] was instructed to mail the medications to the patient.  Patient does not see well in addition, he has an older phone that he cannot navigate when the automated system calls him about refills.  Spoke with the patient's ex-wife Mitchell [name] at his request. Mitchell [name] fills his pill box and said she would assume the responsibility of reordering his medications. (Since the patient is visually impaired)  [phone]  Plan: Will route note to Mitchell [name], CPhT to reach out to the patient in preparation for next year with Mitchell [name], Proventil and Zetia with Mitchell [name]. Follow up on Mitchell [name] Applications in 3 weeks.   Mitchell Mcardle, PharmD, BCACP [facility] Clinical Pharmacist [phone]

## 2018-02-17 NOTE — Patient Outreach (Signed)
[facility]) Care Management  02/17/2018  Mitchell [name] [birth year] [id number]   Referral Date: 02/17/18 Referral Source: MD referral Referral Reason: Coordinate care and pharmacy referral for medication management   Outreach Attempt: No answer.  HIPAA compliant voice message left.     Plan: RN CM will attempt patient again within 4 business days and send a letter.   Bary Leriche, RN, MSN [facility] Care Management Care Management Coordinator Direct Line [phone] Toll Free: [phone]  Fax: [phone]

## 2018-02-17 NOTE — Addendum Note (Signed)
Addended by: Bary Leriche on: 02/17/2018 04:16 PM   Modules accepted: Orders

## 2018-02-17 NOTE — Patient Outreach (Signed)
[facility]) Care Management  02/17/2018  Mitchell [name] 1940-10-16 [id number]   Referral Date: 02/17/18 Referral Source: MD referral Referral Reason: Coordinate care and pharmacy referral for medication management.  Incoming call from patient. Patient able to verify HIPAA.  He states that the pharmacist has just called and completed his medication order.  Discussed with patient other [facility] services.  Patient declined nursing services but wants social work for resources for possible meals on wheels, transportation, and help with getting groceries.    Patient lives alone but states that he has support of ex-wife sometimes.  Patient admits to COPD and states he has good and bad days with his breathing but manages ok.  Offered  Nursing support again but patient declined.  He states he just needs some help with medications for which [name] has outreached today.  He is willing for social work to help with resources for meals, possible transportation and any resources for assistance with groceries.    Plan: RN CM will refer to social work for transportation resources, resources for meals, and maybe assistance with someone helping get groceries.    Bary Leriche, RN, MSN [facility] Care Management Care Management Coordinator Direct Line [phone] Cell [phone] Toll Free: [phone]  Fax: [phone]

## 2018-02-21 ENCOUNTER — Ambulatory Visit: Payer: Medicare PPO

## 2018-02-24 ENCOUNTER — Other Ambulatory Visit: Payer: Self-pay

## 2018-02-24 NOTE — Patient Outreach (Signed)
[facility]) Care Management  02/24/2018  Nathon Stefanski 1940-10-09 [id number]  BSW attempted to contact the patient on today's date to conduct a community resource consult. Unfortunately, today's call was unsuccessful. BSW left the patient a HIPAA compliant voice message requesting a return call.   Plan: BSW will mail the patient an unsuccessful outreach letter. BSW will attempt the patient again within the next four business days.  Bevelyn Ngo, BSW, CDP [facility] [phone]

## 2018-02-28 ENCOUNTER — Other Ambulatory Visit: Payer: Self-pay

## 2018-02-28 NOTE — Patient Outreach (Signed)
[facility]) Care Management  02/28/2018  Mitchell BossierJarald Dean [name] [birth year] [id number]  Successful outreach to the patient on today's date, HIPAA identifiers confirmed. BSW introduced self to the patient and the reason for today's call, indicating the patient had been referred for assistance with transportation and meals on wheels. The patient reports he currently relies on his ex-wife and friends for transportation but is looking for other options.   BSW discussed [employer], [employer] as well as SCAT with the patient. The patient is not interested in [name] as it is volunteer based and not guaranteed to have transportation. The patient refuses SCAT stating "I've heard bad things about that". BSW explained that unfortunately, there are no other options to service the patient unless he would like to pay for a taxi or [employer] ride. The patient stated "right now I'm gonna hold on to what I got. It ain't the best but I'll make it work".  BSW discussed meals on wheels program. The patient stated he has been on the wait list for two months. BSW explained to the patient that the program has an extensive wait list of up to 1 year. The patient stated understanding.  BSW to perform a discipline closure as no other social work needs have been identified. BSW will update the patients provider of discipline closure as well as [facility] pharmacist who is active in the patients case.  Bevelyn NgoKendra Kalani Sthilaire, BSW, CDP [facility] Care Management Social Worker [phone]

## 2018-03-04 DIAGNOSIS — J449 Chronic obstructive pulmonary disease, unspecified: Secondary | ICD-10-CM | POA: Diagnosis not present

## 2018-03-07 ENCOUNTER — Telehealth: Payer: Self-pay | Admitting: Interventional Cardiology

## 2018-03-07 ENCOUNTER — Telehealth: Payer: Self-pay

## 2018-03-07 MED ORDER — PRAVASTATIN SODIUM 20 MG PO TABS: 10.0000 mg | ORAL_TABLET | Freq: Every evening | ORAL | 6 refills | Status: DC

## 2018-03-07 NOTE — Telephone Encounter (Signed)
Per last visit with Mitchell [name], pt was supposed to increase rosuvastatin frequency from 2x a week to 3x a week, not daily. Called pt - he reports his joints locked up on rosuvastatin 3x a week. Will try pravastatin 10mg  every evening. [facility] [employer] [facility]. Pt will call if he develops any intolerances.

## 2018-03-07 NOTE — Telephone Encounter (Signed)
Dr. Ashley RoyaltyMatthews with [facility] called in to let us know that she went to see [name] and he informed her that he has stopped his Rosuvastatin because it was causing pain and he couldn't tolerate taking it everyday.  Dr. Ashley RoyaltyMatthews said he told her that he has been having issues since increasing it when he last seen the pharmacist here.  I thanked her for the call and said I would let the pharmacist know.

## 2018-03-07 NOTE — Telephone Encounter (Signed)
**Note De-Identified  Obfuscation** I have completed the provider part of the [employer] pt assistance application that the pt mailed to us. I have placed it in Dr Lonn GeorgiaSmiths mail bin awaiting his signature.

## 2018-03-10 NOTE — Telephone Encounter (Signed)
Dr Katrinka BlazingSmith has signed the pts BMS pt asst application.  I called the pt to ask if he is applying for 2019 or 2020 and he states it is for 2020. I advised him that since he has Part D he will need to provide his 2019 proof of income and his 2020 out of pocket expense report.  The pt became argumentative and stated "well then take me off that medication then cause I cant afford it".  I advised him that in January of 2020 his insurance coverage will start all over again and that he can re-apply when he goes into the donut hole.   He continued to be argumentative stating "Im blind and I have a lot of health issues not to mention a son on drugs so if I don't get this medicine Ive had a good life".   I asked him who helped him with his application (as he is blind) and he stated his ex wife did. I asked him to have her call me either now or in 2020 so I can explain this to her.  He became nicer and stated that he would have her call me if he finds that he cannot afford his Eliquis. He thanked me for calling and asked that I forgive him as he gets upset easy. I advised him that it is ok and that I understand and will help him as much as I can.

## 2018-03-11 ENCOUNTER — Other Ambulatory Visit: Payer: Self-pay | Admitting: Pharmacist

## 2018-03-11 NOTE — Patient Outreach (Addendum)
[facility]) Care Management  03/11/2018  Mitchell BossierJarald Dean [name] [birth year] [id number]   Patient was called to follow up on his delivery of refills from [facility]. HIPAA identifiers were obtained. Spoke with patient and his caregiver Mitchell [name]([name]).    Mitchell RhodesBetty confirmed the [facility]. However, she said he paid for ezetimibe at his local pharmacy.  She was unaware the patient had been receiving brand name ezetimibe (Zetia) from [employer]. She said he did not have Zetia in his possession.  When asked about Dulera and Proventil, Mitchell RhodesBetty said the patient had not been using Dulera because it was too expensive. Again, she appeared to be unaware the patient received Proventil HFA, Dulera and Zetia from [facility] Patient Assistance Program.    It appears the patient was unaware he needed to call for a refill.  I spoke with Mitchell ShookAshley [name], CPhT who stated the pharmacy [name] uses to fulfill their orders prefers the patient to call for refills.  Mitchell RhodesBetty was given the number to call for refills of: Dulera, Proventil and Zeta. [phone]--Number for ([facility])  In addition, patient will be sent new applications to begin the process again for Zetia, Dulera and Proventil HFA.  Plan: Call patient back in 7-10 business days to see if he placed the order and received his medications from [facility].  Route note to Mitchell LarssonJill [name] to begin the process for 2020 medication assistance.  Mitchell McardleKatina J. Addisen [name], PharmD, BCACP [facility] Clinical Pharmacist [phone]

## 2018-03-12 ENCOUNTER — Encounter: Payer: Self-pay | Admitting: Pharmacist

## 2018-03-12 ENCOUNTER — Ambulatory Visit: Payer: Medicare PPO | Admitting: Pharmacist

## 2018-03-14 ENCOUNTER — Other Ambulatory Visit: Payer: Self-pay | Admitting: Pharmacy Technician

## 2018-03-14 NOTE — Patient Outreach (Signed)
[facility]) Care Management  03/14/2018  Mitchell [name] January 27, 1941 [id number]   Received 2020 patient assistance referall for [name] and BMS from [facility] RPh Nunzio CobbsKatina Boyd for [place]ulera, Proventil, Zetia and Eliquis respectively.  Prepared patient portion to be mailed. Sent inter office mail of  provider portion to Dr. Vassie LollAlva for the inhalers and sent inter office mail of provider portion to Dr. Katrinka BlazingSmith for Zetia. Faxed BMS portion to Dr. Katrinka BlazingSmith for Eliquis.  Will followup in 10-14 business days to inquire if patient has received the applications.  Hanna Ra P. Jaquelyn Sakamoto, CPhT [facility] Care Management [phone]

## 2018-03-18 ENCOUNTER — Other Ambulatory Visit: Payer: Self-pay | Admitting: Pharmacist

## 2018-03-18 ENCOUNTER — Telehealth: Payer: Self-pay

## 2018-03-18 NOTE — Patient Outreach (Signed)
[facility]) Care Management  03/18/2018  Allie BossierJarald Dean Amaro Jun 28, 1940 [id number]   Spoke with patient and his caregiver ([name])--at his request.  HIPAA identifiers were obtained. [name] confirmed she called [facility] to place an order for refills on: Proventil HFA, Dulera and Zetia (03/11/18).  They have not been delivered but should arrive within then next 5-7 business days.  Noreene LarssonJill is working on the [employer] Eliquis application and his Cardiologist's office has acknowledged receipt.   Plan; Pattricia BossJill Simcox, CPhT will follow up with the patient and provider on patient assistance.  Call patient back in 7-10 days to see if he received his shipment from [facility].  Beecher McardleKatina J. Daeron Carreno, PharmD, BCACP [facility] Clinical Pharmacist [phone]

## 2018-03-18 NOTE — Telephone Encounter (Signed)
Provider part of a BMS pt asst application for Eliquis received from [facility] Simcox, CPhT with [facility] via fax with request to finish completing the application, have Dr Katrinka BlazingSmith sign it and to fax it back to her.  I have finished completing the application, Dr Katrinka BlazingSmith has signed it and I have faxed it back to BolivarJill at [facility].

## 2018-03-27 ENCOUNTER — Other Ambulatory Visit: Payer: Self-pay | Admitting: Pharmacy Technician

## 2018-03-27 NOTE — Patient Outreach (Signed)
[facility]) Care Management  03/27/2018  Mitchell [name] Dec 29, 1940 [id number]    Successful outreach call placed to patient in regards to his patient assistance applications.   Spoke to [name], HIPAA identifiers verified, who informed me that he had received his applications in the mail. He stated he had not mailed them back as he has to get help to fill them out as he does not see good. He stated he would try and get his friend to help him this week.  Will followup with patient in 5-7 business days if applications have not been returned.  Cerra Eisenhower P. Sumayah Bearse, CPhT [facility] Care Management [phone]

## 2018-04-03 DIAGNOSIS — J449 Chronic obstructive pulmonary disease, unspecified: Secondary | ICD-10-CM | POA: Diagnosis not present

## 2018-04-04 ENCOUNTER — Other Ambulatory Visit: Payer: Self-pay | Admitting: Pharmacy Technician

## 2018-04-04 NOTE — Patient Outreach (Signed)
[facility]) Care Management  04/04/2018  Mitchell [name] Jan 06, 1941 [id number]   Successful outreach call placed to patient in regards to his patient assistance applications with [name] & [employer] for Zetia, Dulera, Proventil and Eliquis respectively.  Patient states he has not filled out his application yet but had received his 2020 social security documents. He states he will need help filling the application out over the phone.  Will route note to [facility] RPh Nunzio CobbsKatina Boyd for help with application.  Will followup with RPh in next 10-14 business days.  [name] P. Omayra Tulloch, CPhT [facility] Care Management [phone]

## 2018-04-17 ENCOUNTER — Other Ambulatory Visit: Payer: Self-pay | Admitting: Pharmacy Technician

## 2018-04-17 NOTE — Patient Outreach (Signed)
[facility]) Care Management  04/17/2018  Janice Hughbanks 1940-11-08 [id number]    Successful followup call placed to patient in regards to [employer]ck and [employer]ms patient assistance applications for Zeita, Dulera, Proventil and Eliquis respectively.  Spoke to Mr. Groman, HIPAA identifiers verified. Mr. Bundren said he has not been able to have his friend help him with the applications. Mr. Keomany also stated that he was waiting for "[place]" to help him fill them out. Informed patient of previous discussion with him that he was going to have a friend help him with the applications. Confirmed patient had my name and number and requested that he contact me when he sends them in if he chooses too.  Will remove myself from care team due to the numerous attempts to get patient to engage in filling out the forms.  Maryjayne Kleven P. Katyana Trolinger, CPhT Musician Care Management [phone]

## 2018-04-17 NOTE — Patient Outreach (Signed)
[facility]) Care Management  04/17/2018  Mitchell [name] [birth year] [id number]   ADDENDUM  Incoming call received from patient in regards to our previous conversation. HIPAA idenifiers verified.  Mitchell [name] said he was not sure what he had told me in the previous conversation as he was half asleep. He asked that the patient assistance applications be remailed to him as he was unsure of where he placed the initial ones. He informed that he had received his social security statement as well. He said he would try and get his exwife to help him with the forms.  Informed Mitchell [name] to call me when he had mailed them back. Confirmed he had our name and number  Mitchell [name] P. Mitchell [name], CPhT Musician Care Management [phone]

## 2018-04-21 ENCOUNTER — Telehealth: Payer: Self-pay | Admitting: Pharmacist

## 2018-04-21 NOTE — Patient Outreach (Signed)
[facility]) Care Management  04/21/2018  Mitchell BossierJarald Dean [name] 1940/04/19 [id number]  Patient was called to follow up on medication assistance. HIPAA identifiers were obtained. When asked about his applications, patient said his ex-wife, [name], had mailed them. He asked me to call [name]. [name] was called. [name] confirmed the paperwork was completed but could not say if it was actually mailed.  Patient was called back. When I explained to the patient that he would have to meet the out-of-pocket spend requirement again for this year of 3% of his income, he became very angry and emotional.  He said he could not talk to me any more.  I attempted to call [employer] with the patient on the phone to explain his benefit for 2020 but he said his "heart couldn't take it" and disconnected the call.  A message was sent to the [facility]iaison, Stormy CardJennifer Harris to glean some information about the patient specific plan..  Plan: Reach back out to the patient after hearing from [place].   Beecher McardleKatina J. Jadis Mika, PharmD, BCACP [facility] Clinical Pharmacist [phone]

## 2018-04-23 ENCOUNTER — Other Ambulatory Visit: Payer: Self-pay | Admitting: Pharmacist

## 2018-04-24 ENCOUNTER — Telehealth: Payer: Self-pay

## 2018-04-24 NOTE — Telephone Encounter (Signed)
Spoke to the patient over the phone in regard to his Eliquis application. Told him to call back on Monday to speak to Mrs. Larita Fife and also the patient was giving a box of sample of Eliquis 5mg 

## 2018-04-28 NOTE — Telephone Encounter (Signed)
The pt states that he has not and cannot pay his insurance deductible at this time and that he cannot afford his Elquis.  He also states that [facility]st last year they said he had not paid enough out of pocket for his medications to qualify.  He does not know if he has Part D or not and states that his ex-wife Steward DroneBrenda takes care of these things for him.  I have asked him to have Steward DroneBrenda call me as I donr know how to help him at this time with limited knowledge of his insurance coverage.

## 2018-04-29 ENCOUNTER — Other Ambulatory Visit: Payer: Self-pay | Admitting: Pharmacist

## 2018-04-29 NOTE — Patient Outreach (Signed)
[facility]) Care Management  04/29/2018  Mitchell [name] [birth year] [id number]    Patient called back to inquire about Eliquis. HIPAA identifiers were obtained.  It was explained to the patient that he has a $265 deductible and that to get Eliquis today, his copay will be $312 for a 30 day supply or $396 for a 90 day supply.    Patient was very upset about his deductible. He did confirm that he received a 14-day supply of Eliquis from Dr.[name]'s office.  Patient was instructed to reach back out to his provider about samples. Unfortunately, the [employer] Patient Assistance Program requires patients to spend 3% of their income in medication expenses to be eligible for their program.    Plan: Reach back out to the patient in 2 weeks.   [name], PharmD, BCACP [facility] Clinical Pharmacist [phone]

## 2018-04-29 NOTE — Patient Outreach (Addendum)
[facility]) Care Management  04/23/2018  Mitchell [name] 10/08/1940 [id number]   Report back from [employer]:   Patient has a deductible of $265       Left message for patient to call me back.   [name], PharmD, BCACP [facility] Clinical Pharmacist [phone]

## 2018-05-01 ENCOUNTER — Ambulatory Visit: Payer: Self-pay | Admitting: Pharmacist

## 2018-05-04 DIAGNOSIS — J449 Chronic obstructive pulmonary disease, unspecified: Secondary | ICD-10-CM | POA: Diagnosis not present

## 2018-05-06 DIAGNOSIS — R0602 Shortness of breath: Secondary | ICD-10-CM | POA: Diagnosis not present

## 2018-05-13 ENCOUNTER — Ambulatory Visit: Payer: Self-pay | Admitting: Pharmacist

## 2018-05-13 ENCOUNTER — Other Ambulatory Visit: Payer: Self-pay | Admitting: Pharmacist

## 2018-05-13 NOTE — Patient Outreach (Signed)
[facility]) Care Management  05/13/2018  Mitchell [name] 1940-04-30 [id number]   Patient was called to follow up on medication assistance. Unfortunately, he did not answer the phone. HIPAA compliant message was left on the patient's voicemail.  Plan: Call patient back in 2-3 weeks as he had not spent the required amount in medication expenses to qualify to receive Eliquis from [facility] [employer]'s patient assistance program.   [name], PharmD, [facility] Clinical Pharmacist [phone]

## 2018-05-15 ENCOUNTER — Telehealth: Payer: Self-pay | Admitting: Interventional Cardiology

## 2018-05-15 NOTE — Telephone Encounter (Signed)
New message   Patient calling the office for samples of medication:   1.  What medication and dosage are you requesting samples for? apixaban (ELIQUIS) 5 MG TABS tablet  2.  Are you currently out of this medication? No, has enough to last until this Saturday

## 2018-05-15 NOTE — Telephone Encounter (Signed)
**Note De-Identified [name] Obfuscation** Mitchell Hashimoto Via, LPN looks like you have been talking with this pt concerning his eliquis and the pt is calling requesting samples of Eliquis. Please advise

## 2018-05-16 NOTE — Telephone Encounter (Signed)
I am unsure how to help this pt.  On 04/24/2018: I s/w the pt over the phone:  Note    The pt states that he has not and cannot pay his insurance deductible at this time and that he cannot afford his Elquis.  He also states that when [facility] helped him apply for pt asst last year they said he had not paid enough out of pocket for his medications to qualify.  He does not know if he has Part D or not and states that his ex-wife Steward Drone takes care of these things for him.  I have asked him to have Steward Drone call me as I dont know how to help him at this time with limited knowledge of his insurance coverage.     On 04/29/2018 [facility] noted: Mitchell [name], [facility]     3:33 PM  Note    [facility]) Care Management  04/23/2018  Mitchell [name] 10-08-1940 [id number]   Report back from [employer]:   Patient has a deductible of $265       Will forward this message to Dr Katrinka Blazing and his nurse for advisement to the pt.

## 2018-05-16 NOTE — Telephone Encounter (Signed)
Left message to call back  

## 2018-05-16 NOTE — Telephone Encounter (Signed)
Off her Coumadin therapy. Set up Coumadin clinic appointment to discuss.

## 2018-05-20 NOTE — Telephone Encounter (Signed)
**Note De-identified  Obfuscation** See phone note from 05/15/2018 

## 2018-05-20 NOTE — Telephone Encounter (Signed)
Spoke with pt and he does not want to go on Coumadin as he requires transportation and can't get to the INR checks.  Advised pt that I would place samples at the front but he would need to get from pharmacy once these run out.  Advised he has to meet his deductible and then his medications will be a more affordable price.  Pt verbalized understanding.

## 2018-05-27 DIAGNOSIS — N5089 Other specified disorders of the male genital organs: Secondary | ICD-10-CM | POA: Diagnosis not present

## 2018-05-27 DIAGNOSIS — R609 Edema, unspecified: Secondary | ICD-10-CM | POA: Diagnosis not present

## 2018-06-03 ENCOUNTER — Ambulatory Visit: Payer: Self-pay | Admitting: Pharmacist

## 2018-06-04 DIAGNOSIS — J449 Chronic obstructive pulmonary disease, unspecified: Secondary | ICD-10-CM | POA: Diagnosis not present

## 2018-06-11 ENCOUNTER — Other Ambulatory Visit: Payer: Self-pay | Admitting: Pharmacist

## 2018-06-11 NOTE — Patient Outreach (Addendum)
[facility]) Care Management  06/11/2018  Mitchell [name] 1940-07-26 [id number]   Patient was called regarding medication assistance with Eliquis.  HIPAA identifiers were obtained. Patient was reminded about the 3% out-of-pocket medication expense requirement with [employer].  Patient was reminded about his [employer] Drug benefit:$265 deductible and that to get Eliquis today, his copay will be $312 for a 30 day supply or $396 for a 90 day supply.    Patient was very upset about the price of Eliquis and his deductible. He expressed great concern about health care disparities.  Patient said he has enough Eliquis to last him until June 15, 2018.  Reviewed patient's pharmacy claims and saw a prescription for rosuvastatin was filled 06/10/2018.  Patient said he did not request the refill and rosuvastatin is listed on his allergy list due to leg/muscle pain.  Patient confirmed he cannot take rosuvastatin.  [facility] was called and asked to put the prescription back in stock.    [employer] was called with the patient on conference call. The following medications were reordered:  Pravastatin (confirmation # [id number]) -will be delivered in 3-5 business days  Furosemide, Metoprolol, Potassium and Tamsulosin will be delivered in 10-14 business days (Confirmation # [id number])  Plan: Will send a note to Dr. Katrinka Blazing about samples.   [name], PharmD, BCACP [facility] Clinical Pharmacist [phone]

## 2018-06-13 ENCOUNTER — Telehealth: Payer: Self-pay | Admitting: *Deleted

## 2018-06-13 NOTE — Telephone Encounter (Signed)
Called pt and left message to call back.  Samples have been obtained and I will place them at the front for pick up.

## 2018-06-13 NOTE — Telephone Encounter (Signed)
-----   Message from Lyn Records, MD sent at 06/11/2018  6:17 PM EST ----- Victorino Dike can we see to it that he gets 2 weeks of Eliquis samples. ----- Message ----- From: [name], [facility] Sent: 06/11/2018  10:14 AM EST To: Lyn Records, MD  Dr. Katrinka Blazing,  Please see the attached [facility] Pharmacist's note. I followed up with Mr. Fridman today. Unfortunately, he has still not been able to get his Eliquis filled due to his deductible.  The most cost effective option for him is to get a 90-day supply of Eliquis for $396, after then his copay's will be $47. Once he spends about $600, he will be eligible to receive Eliquis at no cost for the rest of the year through [facility] [employer]'s Patient Assistance Program.   After explaining this at length to the patient, he said if he could get at least 2 more weeks Eliquis samples, he would be able to borrow money from a friend.  Thank you so much for your time and consideration.  [name], PharmD, BCACP [facility] Clinical Pharmacist [phone]

## 2018-06-17 NOTE — Telephone Encounter (Signed)
Spoke with pt and he states he didn't get my message but he just got his phone turned back on this morning.  He will come by the office and get the samples.  Pt appreciative for call.

## 2018-06-20 ENCOUNTER — Other Ambulatory Visit: Payer: Self-pay | Admitting: Interventional Cardiology

## 2018-06-20 NOTE — Telephone Encounter (Signed)
Pt last saw Dr Katrinka Blazing 12/25/17, last labs 02/17/18 Creat 1.38 at [place], age 78, weight 97.4kg, based on specified criteria pt is on appropriate dosage of Eliquis 5mg  BID.  Will refill rx.

## 2018-06-25 ENCOUNTER — Ambulatory Visit: Payer: Medicare PPO | Admitting: Nurse Practitioner

## 2018-06-25 ENCOUNTER — Other Ambulatory Visit: Payer: Medicare PPO

## 2018-07-01 ENCOUNTER — Other Ambulatory Visit: Payer: Self-pay | Admitting: Pharmacist

## 2018-07-01 NOTE — Patient Outreach (Signed)
[facility]) Care Management  07/01/2018  Mitchell [name] Jul 27, 1940 [id number]   Patient was called for follow up. HIPAA identifiers were obtained. Patient confirmed he was able to pick up the samples from Dr. Michaelle Copas office and that he is working on getting the money to be able to purchase Eliquis for a 90 day supple.  One he purchases the Eliquis, he will most likely meet the out-of-pocket expenditure requirement for [employer] to be able to get it at no cost through their program.  Plan: Call patient back in 2-3 weeks for follow up/encouragement.   [name], PharmD, BCACP [facility] Clinical Pharmacist [phone]

## 2018-07-03 DIAGNOSIS — J449 Chronic obstructive pulmonary disease, unspecified: Secondary | ICD-10-CM | POA: Diagnosis not present

## 2018-07-16 ENCOUNTER — Telehealth: Payer: Self-pay | Admitting: Interventional Cardiology

## 2018-07-16 NOTE — Telephone Encounter (Signed)
I s/w the pt concerning the cost of his Eliquis. He became tearful and expressed a lot of anguish about the cost of his medications.  He states that he has saved $200 but owes close to $400 on his deductible. He states that switching to Warfarin is not an option for him as he is blind and cannot drive to the required INR visits.  He states that he plans on paying his deductible off in 3 to 4 weeks but is in need of his Eliquis now. He is requesting that we give him 3 to 4 weeks of Eliquis samples. I have advised him that we will leave him 2 boxes of Eliquis samples in the lobby of the [place] office for him to pick up.  He states that a friend named Jonny Ruiz will be picking the samples up for him as he cannot drive.   Will forward this message to Dr Katrinka Blazing and his nurse for advisement to the pt as he states that he cannot afford his Zetia either and has not taken any in a while.

## 2018-07-16 NOTE — Telephone Encounter (Signed)
° ° °  Patient calling the office for samples of medication:   1.  What medication and dosage are you requesting samples for?ELIQUIS 5 MG TABS tablet  2.  Are you currently out of this medication? 1 week

## 2018-07-16 NOTE — Telephone Encounter (Signed)
**Note De-Identified  Obfuscation** No answer so I left a detailed message on his VM (he did identify himself on his VM message) stating that the office has limited supply on Eliquis at this time and that we have discussed with him in the past that we cannot provide him his Eliquis as the samples we have in the office are for new starts.  I left my name, our office phone number and requested that he call me back so we can discuss.

## 2018-07-16 NOTE — Telephone Encounter (Signed)
Please advise 

## 2018-07-17 NOTE — Telephone Encounter (Signed)
No Zetia is okay.

## 2018-07-17 NOTE — Telephone Encounter (Signed)
Spoke with [employer] and they said Eliquis is not available in generic at their store.  Spoke with pt and made him aware ok to be off Zetia.  Pt verbalized understanding and was appreciative for call.

## 2018-07-17 NOTE — Telephone Encounter (Signed)
I think I heard that Eliquis/apixiban was now generic? If so we should switch generic apixiban

## 2018-07-17 NOTE — Telephone Encounter (Signed)
See bold note at the bottom of Lynn's message in regards to Zetia.

## 2018-07-22 ENCOUNTER — Ambulatory Visit: Payer: Self-pay | Admitting: Pharmacist

## 2018-07-22 ENCOUNTER — Other Ambulatory Visit: Payer: Self-pay | Admitting: Pharmacist

## 2018-07-22 NOTE — Patient Outreach (Signed)
[facility]) Care Management  07/22/2018  Mitchell [name] 01-Feb-1941 [id number]   Patient was called regarding medication assistance. Unfortunately, he did not answer the phone. HIPAA compliant message was left on the patient's voicemail.  From chart review, patient received more samples from Dr. Michaelle Copas office of Eliquis and plans to work on paying down his $265 deductible with [employer].  Plan: Call patient back in 2-3 weeks.   [name], PharmD, BCACP [facility] Clinical Pharmacist [phone]

## 2018-07-30 ENCOUNTER — Other Ambulatory Visit: Payer: Self-pay | Admitting: Interventional Cardiology

## 2018-08-01 ENCOUNTER — Telehealth: Payer: Self-pay | Admitting: Interventional Cardiology

## 2018-08-01 NOTE — Telephone Encounter (Signed)
New Message    Pt wants to share with Dr Katrinka Blazing that his home health nurse is Mrs. Ashley Royalty comes weekly to check his vitals. If he wants to ask her for any of that information her phone number is [phone]

## 2018-08-03 DIAGNOSIS — J449 Chronic obstructive pulmonary disease, unspecified: Secondary | ICD-10-CM | POA: Diagnosis not present

## 2018-08-06 ENCOUNTER — Telehealth: Payer: Self-pay

## 2018-08-06 NOTE — Telephone Encounter (Signed)
YOUR CARDIOLOGY TEAM HAS ARRANGED FOR AN E-VISIT FOR YOUR APPOINTMENT - PLEASE REVIEW IMPORTANT INFORMATION BELOW SEVERAL DAYS PRIOR TO YOUR APPOINTMENT  Due to the recent COVID-19 pandemic, we are transitioning in-person office visits to tele-medicine visits in an effort to decrease unnecessary exposure to our patients, their families, and staff. These visits are billed to your insurance just like a normal visit is. We also encourage you to sign up for MyChart if you have not already done so. You will need a smartphone if possible. For patients that do not have this, we can still complete the visit using a regular telephone but do prefer a smartphone to enable video when possible. You may have a family member that lives with you that can help. If possible, we also ask that you have a blood pressure cuff and scale at home to measure your blood pressure, heart rate and weight prior to your scheduled appointment. Patients with clinical needs that need an in-person evaluation and testing will still be able to come to the office if absolutely necessary. If you have any questions, feel free to call our office.     YOUR PROVIDER WILL BE USING THE FOLLOWING PLATFORM TO COMPLETE YOUR VISIT: yes  . IF USING MYCHART - How to Download the MyChart App to Your SmartPhone   - If Apple, go to App Store and type in MyChart in the search bar and download the app. If Android, ask patient to go to Google Play Store and type in MyChart in the search bar and download the app. The app is free but as with any other app downloads, your phone may require you to verify saved payment information or Apple/Android password.  - You will need to then log into the app with your MyChart username and password, and select [facility] as your healthcare provider to link the account.  - When it is time for your visit, go to the MyChart app, find appointments, and click Begin Video Visit. Be sure to Select Allow for your device to access  the Microphone and Camera for your visit. You will then be connected, and your provider will be with you shortly.  **If you have any issues connecting or need assistance, please contact MyChart service desk [phone] ([phone])**  **If using a computer, in order to ensure the best quality for your visit, you will need to use either of the following Internet Browsers: Google Chrome or Microsoft Edge**  . IF USING DOXIMITY or [email or web address].ME - The staff will give you instructions on receiving your link to join the meeting the day of your visit.      2-3 DAYS BEFORE YOUR APPOINTMENT  You will receive a telephone call from one of our [facility] team members - your caller ID may say "Unknown caller." If this is a video visit, we will walk you through how to get the video launched on your phone. We will remind you check your blood pressure, heart rate and weight prior to your scheduled appointment. If you have an Apple Watch or Kardia, please upload any pertinent ECG strips the day before or morning of your appointment to MyChart. Our staff will also make sure you have reviewed the consent and agree to move forward with your scheduled tele-health visit.     THE DAY OF YOUR APPOINTMENT  Approximately 15 minutes prior to your scheduled appointment, you will receive a telephone call from one of HeartCare team - your caller ID may say "Unknown caller."    Our staff will confirm medications, vital signs for the day and any symptoms you may be experiencing. Please have this information available prior to the time of visit start. It may also be helpful for you to have a pad of paper and pen handy for any instructions given during your visit. They will also walk you through joining the smartphone meeting if this is a video visit.    CONSENT FOR TELE-HEALTH VISIT - PLEASE REVIEW  I hereby voluntarily request, consent and authorize [facility] and its employed or contracted physicians, physician assistants,  nurse practitioners or other licensed health care professionals (the Practitioner), to provide me with telemedicine health care services (the "Services") as deemed necessary by the treating Practitioner. I acknowledge and consent to receive the Services by the Practitioner via telemedicine. I understand that the telemedicine visit will involve communicating with the Practitioner through live audiovisual communication technology and the disclosure of certain medical information by electronic transmission. I acknowledge that I have been given the opportunity to request an in-person assessment or other available alternative prior to the telemedicine visit and am voluntarily participating in the telemedicine visit.  I understand that I have the right to withhold or withdraw my consent to the use of telemedicine in the course of my care at any time, without affecting my right to future care or treatment, and that the Practitioner or I may terminate the telemedicine visit at any time. I understand that I have the right to inspect all information obtained and/or recorded in the course of the telemedicine visit and may receive copies of available information for a reasonable fee.  I understand that some of the potential risks of receiving the Services via telemedicine include:  . Delay or interruption in medical evaluation due to technological equipment failure or disruption; . Information transmitted may not be sufficient (e.g. poor resolution of images) to allow for appropriate medical decision making by the Practitioner; and/or  . In rare instances, security protocols could fail, causing a breach of personal health information.  Furthermore, I acknowledge that it is my responsibility to provide information about my medical history, conditions and care that is complete and accurate to the best of my ability. I acknowledge that Practitioner's advice, recommendations, and/or decision may be based on factors not  within their control, such as incomplete or inaccurate data provided by me or distortions of diagnostic images or specimens that may result from electronic transmissions. I understand that the practice of medicine is not an exact science and that Practitioner makes no warranties or guarantees regarding treatment outcomes. I acknowledge that I will receive a copy of this consent concurrently upon execution via email to the email address I last provided but may also request a printed copy by calling the office of [facility].    I understand that my insurance will be billed for this visit.   I have read or had this consent read to me. . I understand the contents of this consent, which adequately explains the benefits and risks of the Services being provided via telemedicine.  . I have been provided ample opportunity to ask questions regarding this consent and the Services and have had my questions answered to my satisfaction. . I give my informed consent for the services to be provided through the use of telemedicine in my medical care  By participating in this telemedicine visit I agree to the above.  

## 2018-08-06 NOTE — Progress Notes (Signed)
Virtual Visit via Telephone Note   This visit type was conducted due to national recommendations for restrictions regarding the COVID-19 Pandemic (e.g. social distancing) in an effort to limit this patient's exposure and mitigate transmission in our community.  Due to his co-morbid illnesses, this patient is at least at moderate risk for complications without adequate follow up.  This format is felt to be most appropriate for this patient at this time.  The patient did not have access to video technology/had technical difficulties with video requiring transitioning to audio format only (telephone).  All issues noted in this document were discussed and addressed.  No physical exam could be performed with this format.  Please refer to the patient's chart for his  consent to telehealth [facility].   Evaluation Performed:  Follow-up visit  Date:  08/07/2018   ID:  Mitchell BossierJarald Dean Foister, DOB [birth year], MRN [id number]  Patient Location: Home Provider Location: Office  PCP:  Daisy Florooss, Charles Alan, MD  Cardiologist:  Lesleigh NoeHenry W  III, MD  Electrophysiologist:  None   Chief Complaint:  CAD / AF  History of Present Illness:    Mitchell [name] is a 78 y.o. male with a hx ofcoronary artery disease with remote LAD/Diagonal cutting balloon PCI, history of RCA?PDA DES, COPD, paroxysmal atrial flutter, chronic apixaban therapy, diastolic heart failure, claudication and hypertension.COPDhas progressed to requirement for home oxygen.  The patient was called on Doximity video and we were able to connect.  He is in no acute distress.  He denies chest pain.  He is pretty much homebound and is dependent upon the services of [facility] care who provide in-home services.  He has chronic shortness of breath and is using oxygen when he sleeps.  There is occasional lower extremity swelling.  He has left scrotal swelling but I was unable to visualize because he was fully dressed and did not want to  disrobe.  He has nitroglycerin.  He has not needed nitroglycerin.  No palpitations or episodes of syncope.  The patient does not have symptoms concerning for COVID-19 infection (fever, chills, cough, or new shortness of breath).    Past Medical History:  Diagnosis Date  . Anginal pain (HCC) 01/2012  . Anxiety   . Atherosclerotic heart disease of native coronary artery without angina pectoris   . BPH (benign prostatic hypertrophy)   . CAD (coronary artery disease) 2007   with LAD/Diag CBPTCA and kissing   . CKD (chronic kidney disease), stage III (HCC)    Hattie Perch/notes 01/04/2017  . COPD (chronic obstructive pulmonary disease) (HCC)    "maybe" (02/14/2012)  . Coronary artery disease   . Depression   . GERD (gastroesophageal reflux disease)   . Hyperlipidemia   . Hypertension   . Hypoxemia   . Myocardial infarction (HCC) 01/22/2012   NSTEMI with DES to RSA (PDA)  . On home oxygen therapy   . Shortness of breath    "walking and laying down sometimes" (02/14/2012)   Past Surgical History:  Procedure Laterality Date  . CATARACT EXTRACTION W/ INTRAOCULAR LENS  IMPLANT, BILATERAL  ~ 2010  . CORONARY ANGIOPLASTY WITH STENT PLACEMENT  05/11/2005   PCI OF LAD  . GANGLION CYST REMOVED  1956?   right  . LEFT HEART CATHETERIZATION WITH CORONARY ANGIOGRAM Bilateral 01/24/2012   Procedure: LEFT HEART CATHETERIZATION WITH CORONARY ANGIOGRAM;  Surgeon: Lesleigh NoeHenry W  III, MD;  [facility];  Service: Cardiovascular;  Laterality: Bilateral;  . REFRACTIVE SURGERY  2010   left     Current Meds  Medication Sig  . acetaminophen (ACETAMINOPHEN EXTRA STRENGTH) 500 MG tablet Take 500 mg by mouth every 6 (six) hours as needed.  Marland Kitchen albuterol (PROVENTIL HFA;VENTOLIN HFA) 108 (90 BASE) MCG/ACT inhaler Inhale 2 puffs into the lungs every 6 (six) hours as needed for wheezing. Reported on 05/04/2015  . aspirin EC 81 MG tablet Take 1 tablet (81 mg total) by mouth daily.  Marland Kitchen BISACODYL PO Take 1 tablet by  mouth daily as needed.  . diclofenac (VOLTAREN) 75 MG EC tablet 75 mg. Take 2 tablets at the onset of gout then 1 tablet twice daily as needed.  Marland Kitchen ELIQUIS 5 MG TABS tablet Take 1 tablet by mouth twice daily  . finasteride (PROSCAR) 5 MG tablet Take 5 mg by mouth daily. Reported on 05/04/2015  . furosemide (LASIX) 40 MG tablet TAKE 1 2 TO 1 (ONE HALF TO ONE) TABLET BY MOUTH ONCE DAILY FOR 90 DAYS  . metoprolol tartrate (LOPRESSOR) 25 MG tablet TAKE 1 TABLET TWO TIMES A DAY. PATIENT NEEDS TO CALL AND SCHEDULE AN APPOINTMENT WITH DR Katrinka Blazing FOR FURTHER REFILLS   . mometasone-formoterol (DULERA) 100-5 MCG/ACT AERO Inhale 2 puffs into the lungs 2 (two) times daily.  . Multiple Vitamins-Minerals (EYE VITAMINS & MINERALS PO) Take 1 tablet by mouth daily.  . nitroGLYCERIN (NITROSTAT) 0.4 MG SL tablet Place 1 tablet (0.4 mg total) under the tongue every 5 (five) minutes as needed. Reported on 05/04/2015  . potassium chloride (KLOR-CON 10) 10 MEQ tablet Take 1 tablet (10 mEq total) by mouth daily.  . pravastatin (PRAVACHOL) 20 MG tablet TAKE 1/2 TABLET EVERY EVENING  . tamsulosin (FLOMAX) 0.4 MG CAPS capsule Take 0.4 mg by mouth daily after breakfast. Reported on 05/04/2015  . [DISCONTINUED] pravastatin (PRAVACHOL) 20 MG tablet TAKE 1/2 TABLET EVERY EVENING     Allergies:   Lipitor [atorvastatin]; Statins; and Rosuvastatin   Social History   Tobacco Use  . Smoking status: Former Smoker    Years: 42.00    Types: Pipe    Last attempt to quit: 04/28/2011    Years since quitting: 7.2  . Smokeless tobacco: Never Used  Substance Use Topics  . Alcohol use: Yes    Alcohol/week: 7.0 standard drinks    Types: 7 Standard drinks or equivalent per week    Comment: 02/14/2012 "weekends I have 6-7 mixed drinks"     occasional  . Drug use: No     Family Hx: The patient's family history includes Heart failure in his brother and mother.  ROS:   Please see the history of present illness.    Leg and back pain  limit mobility.  Dyspnea on exertion is frequent.  No significant lower extremity swelling today. All other systems reviewed and are negative.   Prior CV studies:   The following studies were reviewed today:  2D Doppler echocardiogram January 07, 2017: ------------------------------------------------------------------- Study Conclusions  - Left ventricle: The cavity size was normal. Wall thickness was   normal. Systolic function was normal. The estimated ejection   fraction was in the range of 55% to 60%. Wall motion was normal;   there were no regional wall motion abnormalities. Doppler   parameters are consistent with abnormal left ventricular   relaxation (grade 1 diastolic dysfunction). - Aortic valve: Poorly visualized. There was no stenosis. - Mitral valve: There was no significant regurgitation. - Right ventricle: The cavity size was normal. Systolic function   was normal. -  Pulmonary arteries: No complete TR doppler jet so unable to   estimate PA systolic pressure. - Inferior vena cava: The vessel was normal in size. The   respirophasic diameter changes were in the normal range (>= 50%),   consistent with normal central venous pressure.  Impressions:  - Normal LV size with EF 55-60%. Normal RV size and systolic   function. No significant valvular abnormalities.  Labs/Other Tests and Data Reviewed:    EKG:  No ECG reviewed.  Recent Labs: 02/05/2018: ALT 19   Recent Lipid Panel Lab Results  Component Value Date/Time   CHOL 167 02/05/2018 07:44 AM   TRIG 83 02/05/2018 07:44 AM   HDL 54 02/05/2018 07:44 AM   CHOLHDL 3.1 02/05/2018 07:44 AM   CHOLHDL 4.9 01/12/2017 05:03 AM   LDLCALC 96 02/05/2018 07:44 AM    Wt Readings from Last 3 Encounters:  08/07/18 214 lb 9.6 oz (97.3 kg)  12/25/17 214 lb 12.8 oz (97.4 kg)  08/20/17 217 lb (98.4 kg)     Objective:    Vital Signs:  BP 117/76   Pulse (!) 53   Ht 5\' 9"  (1.753 m)   Wt 214 lb 9.6 oz (97.3 kg)    BMI 31.69 kg/m    VITAL SIGNS:  reviewed  Appears chronically ill  ASSESSMENT & PLAN:    1. Coronary artery disease involving native coronary artery of native heart with angina pectoris (HCC)   2. Essential hypertension   3. CKD (chronic kidney disease), stage III (HCC)   4. Chronic obstructive pulmonary disease, unspecified COPD type (HCC)   5. Chronic diastolic heart failure (HCC)   6. Educated About Covid-19 Virus Infection    PLAN:  1. He is not having angina pectoris.  We briefly reviewed secondary risk prevention.  Please see below. 2. Under the circumstances, blood pressure is adequate. 3. Not discussed. 4. Continue supplemental oxygen therapy. 5. Reviewed the patient's chart.  Echocardiogram as noted above demonstrates diastolic dysfunction.  Age and presence of hypertension and coronary disease make it highly likely that diastolic heart failure is also contributing to his shortness of breath.  There is no clinical evidence of excessive volume overload at this time.  Continue same therapy.   COVID-19 Education: The signs and symptoms of COVID-19 were discussed with the patient and how to seek care for testing (follow up with PCP or arrange E-visit).  The importance of social distancing was discussed today.  Time:   Today, I have spent 15 minutes with the patient with telehealth technology discussing the above problems.     Medication Adjustments/Labs and Tests Ordered: Current medicines are reviewed at length with the patient today.  Concerns regarding medicines are outlined above.   Tests Ordered: No orders of the defined types were placed in this encounter.   Medication Changes: Meds ordered this encounter  Medications  . pravastatin (PRAVACHOL) 20 MG tablet    Sig: TAKE 1/2 TABLET EVERY EVENING    Dispense:  45 tablet    Refill:  1    Disposition:  Follow up in 6 month(s)  Signed, Lesleigh Noe, MD  08/07/2018 12:41 PM    [facility]

## 2018-08-07 ENCOUNTER — Encounter: Payer: Self-pay | Admitting: Interventional Cardiology

## 2018-08-07 ENCOUNTER — Telehealth (INDEPENDENT_AMBULATORY_CARE_PROVIDER_SITE_OTHER): Payer: Medicare PPO | Admitting: Interventional Cardiology

## 2018-08-07 ENCOUNTER — Other Ambulatory Visit: Payer: Self-pay

## 2018-08-07 VITALS — BP 117/76 | HR 53 | Ht 69.0 in | Wt 214.6 lb

## 2018-08-07 DIAGNOSIS — J449 Chronic obstructive pulmonary disease, unspecified: Secondary | ICD-10-CM

## 2018-08-07 DIAGNOSIS — I10 Essential (primary) hypertension: Secondary | ICD-10-CM

## 2018-08-07 DIAGNOSIS — N183 Chronic kidney disease, stage 3 unspecified: Secondary | ICD-10-CM

## 2018-08-07 DIAGNOSIS — I5032 Chronic diastolic (congestive) heart failure: Secondary | ICD-10-CM

## 2018-08-07 DIAGNOSIS — I25119 Atherosclerotic heart disease of native coronary artery with unspecified angina pectoris: Secondary | ICD-10-CM

## 2018-08-07 DIAGNOSIS — Z7189 Other specified counseling: Secondary | ICD-10-CM

## 2018-08-07 MED ORDER — PRAVASTATIN SODIUM 20 MG PO TABS: ORAL_TABLET | ORAL | 1 refills | Status: DC

## 2018-08-07 NOTE — Patient Instructions (Signed)
Medication Instructions:  Your physician recommends that you continue on your current medications as directed. Please refer to the Current Medication list given to you today.  If you need a refill on your cardiac medications before your next appointment, please call your pharmacy.   Lab work: None If you have labs (blood work) drawn today and your tests are completely normal, you will receive your results only by: . MyChart Message (if you have MyChart) OR . A paper copy in the mail If you have any lab test that is abnormal or we need to change your treatment, we will call you to review the results.  Testing/Procedures: None  Follow-Up: At [facility], you and your health needs are our priority.  As part of our continuing mission to provide you with exceptional heart care, we have created designated Provider Care Teams.  These Care Teams include your primary Cardiologist (physician) and Advanced Practice Providers (APPs -  Physician Assistants and Nurse Practitioners) who all work together to provide you with the care you need, when you need it. You will need a follow up appointment in 6-9 months.  Please call our office 2 months in advance to schedule this appointment.  You may see Henry W Smith III, MD or one of the following Advanced Practice Providers on your designated Care Team:   Lori Gerhardt, NP Laura Ingold, NP . [name] McDaniel, NP  Any Other Special Instructions Will Be Listed Below (If Applicable).    

## 2018-08-12 ENCOUNTER — Other Ambulatory Visit: Payer: Self-pay | Admitting: Pharmacist

## 2018-08-13 NOTE — Patient Outreach (Signed)
[facility]) Care Management  08/13/2018  Mitchell [name] 1940/06/28 [id number]  LATE ENTRY for 08/12/2018  Patient was called to follow up on medication assistance with Eliquis. HIPAA identifiers were obtained.  Patient said he was going to try to purchase Eliquis this weekend if he is able to get his stimulus check.   [employer] was called to determine if the patient had reached his deductible. He still has $265 of his $265 deductible to meet has tier 1-2 medications do not count towards his deductible.  Patient was very disheartened to hear this but communicated understanding.  [employer] stated a 3 month supply of Eliquis would have a copay of $396.  If the patient paid this amount, it would be helpful to help get him qualified for [employer]'s program. BMS requires patients to spend 3% of their income on medication expenses to be approved.  Called BMS patient assistance program. They stated they are still waiting on a new application to be faxed to them.  They said the provider's office faxed them the [name] application instead of the [employer] one and that they had called the office and let them know.  [name] Simcox with [facility] said she had the provider's portion and would fax it to BMS. It is still unclear if the patient sent his portion back to [place].  He does not see well.   Plan: Follow up in 1 week to see if patient was able to purchase Eliquis.   [name], PharmD, BCACP [facility] Clinical Pharmacist [phone]

## 2018-08-14 ENCOUNTER — Other Ambulatory Visit: Payer: Self-pay | Admitting: Pharmacy Technician

## 2018-08-14 NOTE — Patient Outreach (Signed)
[facility]) Care Management  08/14/2018  Mitchell [name] 11-15-1940 [id number]   Received phone call from [facility] [name] requesting that the provider portion of the BMS application for Eliquis be faxed into BMS. Patient's portion has not been received back.   Faxed provider portion as requestion.  Will route note to [facility] [name].  [name], CPhT Mus[facility] Care Management [phone]

## 2018-08-19 ENCOUNTER — Ambulatory Visit: Payer: Medicare PPO | Admitting: Interventional Cardiology

## 2018-08-19 ENCOUNTER — Other Ambulatory Visit: Payer: Self-pay | Admitting: Pharmacist

## 2018-08-19 ENCOUNTER — Other Ambulatory Visit: Payer: Self-pay | Admitting: Pharmacy Technician

## 2018-08-19 NOTE — Patient Outreach (Signed)
[facility]) Care Management  08/19/2018  Mitchell [name] 09/25/1940 [id number]    Received phone call from [facility] [name]  requesting that I re mail out the BMS patient assistance application for ELiquis and the [name] application for [name] and Proventil to the patient.  Filled out as much of the information as I knew for the patient on the above named applications and starred with an orange high lighter the places the patient needed to fill in along with the documents he needed to provide with the application.  Will route note to [facility] [name].  [name] P. Namish Krise, CPhT Musician Care Management [phone]

## 2018-08-19 NOTE — Patient Outreach (Signed)
[facility]) Care Management  08/19/2018  Mitchell [name] 09/25/1940 [id number]   Patient was called to follow up on medication assistance. HIPAA identifiers were obtained. Patient confirmed he went to pick up Eliquis. He was very upset that the price was $406 instead of $396 as we were told by [facility].    We have the provider's portion from the provider's office and it was submitted on 08/12/2018.  Unfortunately, we do not have the patient's portions.    Noreene Larsson Simcox will mail the patient his portion of the applications again.  Plan: Call patient back in 1 month.  [name], PharmD, BCACP [facility] Clinical Pharmacist [phone]

## 2018-09-02 DIAGNOSIS — J449 Chronic obstructive pulmonary disease, unspecified: Secondary | ICD-10-CM | POA: Diagnosis not present

## 2018-09-03 DIAGNOSIS — J449 Chronic obstructive pulmonary disease, unspecified: Secondary | ICD-10-CM | POA: Diagnosis not present

## 2018-09-03 DIAGNOSIS — Z9981 Dependence on supplemental oxygen: Secondary | ICD-10-CM | POA: Diagnosis not present

## 2018-09-03 DIAGNOSIS — I10 Essential (primary) hypertension: Secondary | ICD-10-CM | POA: Diagnosis not present

## 2018-09-03 DIAGNOSIS — N183 Chronic kidney disease, stage 3 (moderate): Secondary | ICD-10-CM | POA: Diagnosis not present

## 2018-09-03 DIAGNOSIS — M1711 Unilateral primary osteoarthritis, right knee: Secondary | ICD-10-CM | POA: Diagnosis not present

## 2018-09-11 ENCOUNTER — Encounter: Payer: Self-pay | Admitting: Pharmacy Technician

## 2018-09-11 ENCOUNTER — Other Ambulatory Visit: Payer: Self-pay | Admitting: Pharmacy Technician

## 2018-09-11 NOTE — Patient Outreach (Signed)
[facility]) Care Management  09/11/2018  Mitchell [name] October 14, 1940 [id number]    Successful outreach call placed to patient in regards to patient's BMS application for Eliquis and [name] application for Zetia, Dulera and Proventil.  Spoke to patient, HIPAA identifiers verified.  Informed patient that I had received his applications back in the mail. Informed him that the BMS requires his pharmacy printout or his [employer] printout in order for the application to be processed. Inquired if I could mail the patient an envelope to send that information back and patient was agreeable.  Also informed patient that I had mailed off his [name] applications today and that I would followup with [name] in 10-14 business days and that I would be in touch.  Mitchell [name] P. Bernardo Brayman, CPhT Musician Care Management [phone]

## 2018-09-16 ENCOUNTER — Other Ambulatory Visit: Payer: Self-pay | Admitting: Pharmacist

## 2018-09-16 NOTE — Patient Outreach (Signed)
[facility]) Care Management  09/16/2018  Mitchell [name] [birth year] [id number]   Patient was called to follow up on medication assistance. HIPAA identifiers were obtained. Patient reported he had not received the envelope we sent him so he can send back his pharmacy print out or [employer] EOB.    Patient requested that I speak with his ex-wife and caretaker [name]).  After explaining what was needed, [name] communicated understanding and said she would send the necessary documentation ASAP.  Plan: Pattricia Boss will follow patient.  [name], PharmD, BCACP [facility] Clinical Pharmacist [phone]

## 2018-09-23 ENCOUNTER — Other Ambulatory Visit: Payer: Self-pay | Admitting: Pharmacy Technician

## 2018-09-23 DIAGNOSIS — J449 Chronic obstructive pulmonary disease, unspecified: Secondary | ICD-10-CM | POA: Diagnosis not present

## 2018-09-23 DIAGNOSIS — I509 Heart failure, unspecified: Secondary | ICD-10-CM | POA: Diagnosis not present

## 2018-09-23 DIAGNOSIS — I10 Essential (primary) hypertension: Secondary | ICD-10-CM | POA: Diagnosis not present

## 2018-09-23 NOTE — Patient Outreach (Signed)
[facility]) Care Management  09/23/2018  Mitchell [name] 1941-03-20 [id number]    Care coordination call placed to BMS in regards to patient's application for ELiquis.  Spoke to [place]  Who informed that patient needs to spend 200 dollars more based on April 2020 note. Informed Elta Guadeloupe that on Sep 11, 2018 we submitted an up dated out of pocket amount. Mark informed he did find that information and would send it to the processing team to have the application reprocessed. He informed they may not take the cash register receipt as it is ambiguous. Informed Elta Guadeloupe that it does show an RX number and Elta Guadeloupe was able to locate that. He informed he would notate that on the case files.  Elta Guadeloupe suggested calling back in a few days to see if a determination has been made.  Will followup with BMS in 2-3 business days to inquire about status of application.  Boaz Berisha P. Miliani Deike, [place] Management [phone]

## 2018-09-26 ENCOUNTER — Other Ambulatory Visit: Payer: Self-pay | Admitting: Pharmacy Technician

## 2018-09-26 NOTE — Patient Outreach (Signed)
Mitchell [name] [facility]) Care Management  09/26/2018  Mitchell [name] 1940/12/12 [id number]   Care coordination call placed to BMS and [employer] in regards to patient's application for Eliquis.  Spoke to [employer] at [facility] who informed patient had been APPROVED 09/23/2018-04/16/2019. Merlin transferred me to [facility] for shipment information. Spoke to [place] at [employer] who informed patient's Eliquis was delivered on 09/25/2018 at 6:14pm and was left at the front door.  Care coordination call placed to Bradley at [employer] in regards to patient's application for Zetia, Dulera and Proventil. Myra informed they had received the patient's application and had mailed out the attestation letter on 09/25/2018. Once the attestation letter and accompanying application is sent back, then the process can continue.  Will outreach patient with this information.  Saralynn Langhorst P. Jamerius Boeckman, [place] Management [phone]

## 2018-09-26 NOTE — Patient Outreach (Signed)
[facility]) Care Management  09/26/2018  Mitchell [name] 01-Jun-1940 [id number]  ADDENDUM  Successful outreach call placed to patient in regards to BMS application for Eliquis and [name] application for Zetia, Dulera and Proventil.  Spoke to patient, HIPAA identifiers verified.  Patient informed that he received a 90 days supply of Eliquis from BMS. Informed patient to call the company [employer] at [phone], option 6, when he needs a refill of that medication. Patient verbalized understanding.  Informed patient that he would be receiving an attestation letter from [employer] in the mail. Informed patient to call me when he receives that letter so we can discuss it together. Patient informed he and a friend would be calling me as patient informed he can not see very well and will need his friend to assist him.  Patient informed that he had questions concerning his portable oxygen and his oxygen saturations (can a nurse come to his home to monitor). Informed patient I would have someone call him back concerning his oxygen.  Will route note to [place] with assistance in a contact person concerning his oxygen and to update her on his patient assistance.  Will followup with patient in 10-14 business days if call has not been returned concerning the [employer].  Mitchell [name] P. Zakhari Fogel, [place] Management [phone]

## 2018-10-03 ENCOUNTER — Telehealth: Payer: Self-pay | Admitting: Pharmacist

## 2018-10-03 DIAGNOSIS — J449 Chronic obstructive pulmonary disease, unspecified: Secondary | ICD-10-CM | POA: Diagnosis not present

## 2018-10-03 NOTE — Patient Outreach (Signed)
[facility]) Care Management  10/03/2018  Mitchell [name] 18-Jun-1940 [id number]   Called patient to follow up on oxygen questions. HIPAA identifiers were obtained. Sharee Pimple Simcox asked me to reach out to the patient about his oxygen.    Patient confirmed that he is taking his pulse ox, weight, and heart rate daily.  He is keeping a log and that is supposed to be checked by [place].  [facility] provides his Oxygen. He said he now has a portable oxygen tank.  Patient received Eliquis from [facility].    He said he received the attestation letter and that his caregiver/[name] mailed it back to [employer].  Plan: Susy Frizzle will continue to follow for medication assistance. Pharmacist will follow up with patient at original scheduled follow up  in 3-4 weeks.  Elayne Guerin, PharmD, [place] Clinical Pharmacist [phone]

## 2018-10-09 ENCOUNTER — Other Ambulatory Visit: Payer: Self-pay | Admitting: Pharmacy Technician

## 2018-10-09 NOTE — Patient Outreach (Signed)
[facility]) Care Management  10/09/2018  Mitchell [name] March 23, 1941 [id number]    Care coordination call placed to [name] patient assistance in regards to patient's application for Dulera, Proventil and Zetia.  Spoke to [employer] who informed they have not received back the attestation form.  Will followup with [name] in 5-7 business days.  [name] P. Jaaron Oleson, [place] Management [phone]

## 2018-10-16 ENCOUNTER — Other Ambulatory Visit: Payer: Self-pay | Admitting: Pharmacy Technician

## 2018-10-16 NOTE — Patient Outreach (Signed)
[facility]) Care Management  10/16/2018  Jerrelle Michelsen 1941-01-03 [id number]    Care coordination call placed to patient in regards to [place] application for [place], Proventil and Zetia.  Spoke to [employer] who informed patient was enrolled in the program for [facility] and Proventil only as of 10/15/2018-04/16/2019. Patient should receive the medications within 10-14 business days.  Inquired about Zetia application. Adonis Huguenin said the Zetia application was considered void and both provider and patient's parts were shredded because patient only filled out one page of page 1 and since 2 different providers, he should have filled out two pages of page 1. She informed both patient and provider would need to submit a new application for Zetia if patient wishes to apply for that medication.  Will route note to [place] for assistance and recommendation.  Will followup with patient in 10-14 business days to inquire if he has received the [facility] and Proventil.  Aydrian Halpin P. Jene Huq, [place] Management [phone]

## 2018-10-22 ENCOUNTER — Other Ambulatory Visit: Payer: Self-pay | Admitting: Pharmacy Technician

## 2018-10-22 NOTE — Patient Outreach (Signed)
[facility] [facility]) Care Management  10/22/2018  Mitchell [name] February 03, 1941 [id number]   Care coordination call placed to [name] in regards to patient's application for [facility] and Proventil.   Spoke to Mickel Baas who informed the pharmacy received the request on 10/20/2018 but medication has not shipped yet. Typcially takes 7-10 business days once request is received.  Will followup with patient in 14-21 business days to confirm medication received.  Vonita Calloway P. Jai Bear, [place] Management [phone]

## 2018-10-23 DIAGNOSIS — J449 Chronic obstructive pulmonary disease, unspecified: Secondary | ICD-10-CM | POA: Diagnosis not present

## 2018-10-23 DIAGNOSIS — I509 Heart failure, unspecified: Secondary | ICD-10-CM | POA: Diagnosis not present

## 2018-10-23 DIAGNOSIS — I10 Essential (primary) hypertension: Secondary | ICD-10-CM | POA: Diagnosis not present

## 2018-10-29 DIAGNOSIS — M79606 Pain in leg, unspecified: Secondary | ICD-10-CM | POA: Diagnosis not present

## 2018-10-29 DIAGNOSIS — R609 Edema, unspecified: Secondary | ICD-10-CM | POA: Diagnosis not present

## 2018-10-29 DIAGNOSIS — M1990 Unspecified osteoarthritis, unspecified site: Secondary | ICD-10-CM | POA: Diagnosis not present

## 2018-10-31 DIAGNOSIS — M109 Gout, unspecified: Secondary | ICD-10-CM | POA: Diagnosis not present

## 2018-10-31 DIAGNOSIS — M7989 Other specified soft tissue disorders: Secondary | ICD-10-CM | POA: Diagnosis not present

## 2018-11-02 DIAGNOSIS — J449 Chronic obstructive pulmonary disease, unspecified: Secondary | ICD-10-CM | POA: Diagnosis not present

## 2018-11-03 ENCOUNTER — Other Ambulatory Visit (HOSPITAL_BASED_OUTPATIENT_CLINIC_OR_DEPARTMENT_OTHER): Payer: Self-pay | Admitting: Family Medicine

## 2018-11-03 ENCOUNTER — Other Ambulatory Visit: Payer: Medicare PPO

## 2018-11-03 ENCOUNTER — Other Ambulatory Visit: Payer: Self-pay | Admitting: Pharmacy Technician

## 2018-11-03 DIAGNOSIS — R609 Edema, unspecified: Secondary | ICD-10-CM

## 2018-11-03 NOTE — Patient Outreach (Signed)
[place] [facility]) Care Management  11/03/2018  Joenathan Sakuma 18-Jan-1941 [id number]  Care coordination call placed to [name] in regards to patient's application for [facility] and Proventil.  Spoke to [place] who informed 1 inhaler of each kind was delivered on 11/01/2018 and left at customer's home.  Will followup with patient.  Eunice Winecoff P. Nayib Remer, [place] Management [phone]

## 2018-11-03 NOTE — Patient Outreach (Signed)
[place] [facility]) Care Management  11/03/2018  Jud Fanguy [birth year] [id number]   ADDENDUM  Successful outreach call placed to patient in regards to [name] application for [place], Proventil and Zetia.  Spoke to patient, HIPAA identifiers verified.  Patient informed he had received 1 Dulera and 1 Proventil inhaler. Patient also informed that he received the Zetia application and had mailed it back last week.  Will followup with patient in 10-14 business days if application has not been received.  Lanitra Battaglini P. Twana Wileman, [place] Management [phone]

## 2018-11-04 ENCOUNTER — Ambulatory Visit (HOSPITAL_BASED_OUTPATIENT_CLINIC_OR_DEPARTMENT_OTHER)
Admission: RE | Admit: 2018-11-04 | Discharge: 2018-11-04 | Disposition: A | Discharge status: Home / Self Care | Payer: Medicare PPO | Source: Ambulatory Visit | Attending: Family Medicine | Admitting: Family Medicine

## 2018-11-04 ENCOUNTER — Other Ambulatory Visit: Payer: Self-pay

## 2018-11-04 ENCOUNTER — Other Ambulatory Visit: Payer: Self-pay | Admitting: Pharmacy Technician

## 2018-11-04 DIAGNOSIS — R609 Edema, unspecified: Secondary | ICD-10-CM | POA: Diagnosis not present

## 2018-11-04 DIAGNOSIS — N433 Hydrocele, unspecified: Secondary | ICD-10-CM | POA: Diagnosis not present

## 2018-11-04 NOTE — Patient Outreach (Signed)
[place] [facility]) Care Management  11/04/2018  Nakota Elsen [birth year] [id number]    Received all necessary documents and signatures for [name] patient assistance for Zetia from both patient and provider.  Submitted completed application via mail.  Will followup with [name] in 10-14 business days.  Armella Stogner P. Zaylan Kissoon, [place] Management [phone]

## 2018-11-11 ENCOUNTER — Ambulatory Visit: Payer: Self-pay | Admitting: Pharmacist

## 2018-11-23 DIAGNOSIS — I509 Heart failure, unspecified: Secondary | ICD-10-CM | POA: Diagnosis not present

## 2018-11-23 DIAGNOSIS — I10 Essential (primary) hypertension: Secondary | ICD-10-CM | POA: Diagnosis not present

## 2018-11-23 DIAGNOSIS — J449 Chronic obstructive pulmonary disease, unspecified: Secondary | ICD-10-CM | POA: Diagnosis not present

## 2018-11-24 ENCOUNTER — Other Ambulatory Visit: Payer: Self-pay | Admitting: Pharmacy Technician

## 2018-11-24 NOTE — Patient Outreach (Signed)
[place] [facility]) Care Management  11/24/2018  Mitchell [name] 05/28/1940 [id number]  ADDENDUM  Successful outreach call placed to patient in regards to [name] application for [place].  Spoke to patient who informed he was eating lunch and this was not a convenient time. He informed he would be available around 3:30pm. Will attempt to call patient back at 3:30pm if schedule allows.  Otherwise will followup with [name] in 10-14 business days to inquire if the attestation form has been received back.  Mitchell [name] P. Shaddai Shapley, [place] Management [phone]

## 2018-11-24 NOTE — Patient Outreach (Signed)
[place] [facility]) Care Management  11/24/2018  Aulden Calise 03/23/1941 [id number]   Care coordination call placed to [name] in regards to patient's Zetia application.  Spoke to [place] who informed application was received on 11/14/2018. He informed the attestation form was mailed to patient either on 7/31 or 11/17/2018. Once the attestation form is received back then the application process can continue.  Will followup with patient.  Cordia Miklos P. Parker Wherley, [place] Management [phone]

## 2018-11-26 DIAGNOSIS — M179 Osteoarthritis of knee, unspecified: Secondary | ICD-10-CM | POA: Diagnosis not present

## 2018-11-26 DIAGNOSIS — F4321 Adjustment disorder with depressed mood: Secondary | ICD-10-CM | POA: Diagnosis not present

## 2018-11-26 DIAGNOSIS — G4700 Insomnia, unspecified: Secondary | ICD-10-CM | POA: Diagnosis not present

## 2018-12-03 ENCOUNTER — Other Ambulatory Visit: Payer: Self-pay | Admitting: Pharmacy Technician

## 2018-12-03 NOTE — Patient Outreach (Signed)
[place] [facility]) Care Management  12/03/2018  [name] [birth year] [id number]    Care coordination call placed to [name] in regards to patient's Zetia application.  Spoke to [name] who informed the attestation form has not been received back (Patient is aware of the need to send back the attestation form due to applying for inhalers with [name] earlier in the year). [name] informed once attestation form is received the application process can continue.  Will followup with [name] in 5-10 business days to inquire if attestation form was received.  Mitchell Redmon P. Mitchell [name], [name] Management [phone]

## 2018-12-11 ENCOUNTER — Other Ambulatory Visit: Payer: Self-pay | Admitting: Pharmacist

## 2018-12-11 NOTE — Patient Outreach (Signed)
[name]) Care Management  12/11/2018  Mitchell [name] June 22, 1940 [id number]   Patient was called for follow up. HIPAA identifiers were obtained. Patient reported not feeling well emotionally because his son died. He reported his son was 79 years old and that he was heartbroken.  My condolences were provided to the patient and I listened while he expressed some of his grief.  Patient said due to his emotional state, he had no idea about an attestation form and suggested I call his ex-wife, Mitchell [name], as she helps with his medications and mail. Patient has visual problems as well and said he does not see well enough to read his mail.  A message was left on Ms. Mitchell [name]'s voicemail.  Patient was offered a referral to [facility] to explore grief counseling resources and he refused.  Plan: Call patient back in 2-3 weeks if I do not hear back from Ms. Mitchell [name] about the attestation form.   Mitchell [name], PharmD, [place] Clinical Pharmacist [phone]

## 2018-12-12 ENCOUNTER — Other Ambulatory Visit: Payer: Self-pay | Admitting: Pharmacy Technician

## 2018-12-12 NOTE — Patient Outreach (Signed)
[place] [facility]) Care Management  12/12/2018  Mitchell [name] 09-30-1940 [id number]   Care coordination call in regards to [place] application for [place].  Spoke to [place] who informed [name] has not received the attestation form back from patient.  Will followup with [name] in 15-20 business days to inquire about attestation form.  Mitchell Edick P. Mitchell [name], [place] Management [phone]

## 2018-12-18 ENCOUNTER — Other Ambulatory Visit: Payer: Self-pay | Admitting: Pharmacy Technician

## 2018-12-18 NOTE — Patient Outreach (Signed)
[place] [facility]) Care Management  12/18/2018  Advay Volante [birth year] [id number]  Care coordination call placed to [name] in regards to patient's Zetia application.  Spoke to [place] who informed they have not received back the attestation form.  Will followup with [name] in 15-20 business days to inquire if attestation form has been received back.  Duong Haydel P. Korie Streat, [place] Management [phone]

## 2018-12-24 DIAGNOSIS — J449 Chronic obstructive pulmonary disease, unspecified: Secondary | ICD-10-CM | POA: Diagnosis not present

## 2018-12-24 DIAGNOSIS — I509 Heart failure, unspecified: Secondary | ICD-10-CM | POA: Diagnosis not present

## 2018-12-24 DIAGNOSIS — I10 Essential (primary) hypertension: Secondary | ICD-10-CM | POA: Diagnosis not present

## 2018-12-25 ENCOUNTER — Other Ambulatory Visit: Payer: Self-pay | Admitting: Pharmacist

## 2018-12-25 NOTE — Patient Outreach (Signed)
[place] [facility]) Care Management  12/25/2018  Mitchell [name] 08/06/1940 [id number]  Called patient to follow up on medication assistance and patient assistance forms. HIPAA identifiers were obtained.  Patient's ex-wife and caretaker, Mitchell [name] was on speaker phone as well. Mitchell [name] confirmed the patient had a Scientist, clinical (histocompatibility and immunogenetics) that we mailed to them but did not have the "attestation form" from [employer].  Patient previously sent a completed application back to [facility] and we sent it to [employer]. The attestation portion of the process is the final step before the patient is approved.  Mrs. Mitchell [name] said they did not have the form from [employer].  She was instructed to complete and mail back the forms back to [facility] and we will start the process again.  When [name] sends the attestation form, they also send the original application.  Plan: Follow up with the patient in 3-4 weeks.   Elayne Guerin, PharmD, [place] Clinical Pharmacist [phone]

## 2019-01-01 ENCOUNTER — Other Ambulatory Visit: Payer: Self-pay | Admitting: Cardiology

## 2019-01-08 DIAGNOSIS — M25561 Pain in right knee: Secondary | ICD-10-CM | POA: Diagnosis not present

## 2019-01-08 DIAGNOSIS — M1712 Unilateral primary osteoarthritis, left knee: Secondary | ICD-10-CM | POA: Diagnosis not present

## 2019-01-08 DIAGNOSIS — M1711 Unilateral primary osteoarthritis, right knee: Secondary | ICD-10-CM | POA: Diagnosis not present

## 2019-01-08 DIAGNOSIS — M25562 Pain in left knee: Secondary | ICD-10-CM | POA: Diagnosis not present

## 2019-01-13 ENCOUNTER — Telehealth: Payer: Self-pay | Admitting: *Deleted

## 2019-01-13 ENCOUNTER — Other Ambulatory Visit: Payer: Self-pay | Admitting: Pharmacy Technician

## 2019-01-13 NOTE — Telephone Encounter (Signed)
Pt takes Eliquis for afib with CHADS2VASc score of 5 (age x2, CHF, HTN, CAD). SCr 1.38 in November 2019, CrCl 59/81mL/min. Ok to hold Eliquis for 3 days prior to TKA per protocol.

## 2019-01-13 NOTE — Telephone Encounter (Signed)
Attempted to leave a VM, mailbox is full. I will try again tomorrow.

## 2019-01-13 NOTE — Patient Outreach (Signed)
[place] [facility]) Care Management  01/13/2019  Newman Waren [birth year] [id number]    Care coordination call placed to [name] in regards to patient's application for Zetia.  Spoke to [place] who informed they have not received back the attestation form. She informed to try back in the next week or so.  Will followup with [name] in 4 weeks to inquire if attestation form was received.  Oluwatoni Rotunno P. Aidaly Cordner, [place] Management [phone]

## 2019-01-13 NOTE — Telephone Encounter (Signed)
   [facility] Pre-operative Risk Assessment    Request for surgical clearance:  1. What type of surgery is being performed? RIGHT TOTAL KNEE ARTHROPLASTY   2. When is this surgery scheduled? TBD   3. What type of clearance is required (medical clearance vs. Pharmacy clearance to hold med vs. Both)? BOTH  4. Are there any medications that need to be held prior to surgery and how long? ASA AND ELIQUIS   5. Practice name and name of physician performing surgery? [facility]; DR. Jenny Reichmann GRAV[place]   6. What is your office phone number [phone]    7.   What is your office fax number [phone]  8.   Anesthesia type (None, local, MAC, general) ? SPINAL    Julaine Hua 01/13/2019, 1:47 PM  _________________________________________________________________   (provider comments below)

## 2019-01-15 DIAGNOSIS — Z23 Encounter for immunization: Secondary | ICD-10-CM | POA: Diagnosis not present

## 2019-01-15 DIAGNOSIS — M1711 Unilateral primary osteoarthritis, right knee: Secondary | ICD-10-CM | POA: Diagnosis not present

## 2019-01-15 NOTE — Telephone Encounter (Signed)
Scheduled sooner appt with Dr. Tamala Julian on Tuesday, 01/27/19 at 3:40 PM for clearance.

## 2019-01-15 NOTE — Telephone Encounter (Signed)
Sent successfully to [employer] via [employer].

## 2019-01-15 NOTE — Telephone Encounter (Signed)
   Primary Cardiologist:Henry Nicholes Stairs III, MD  Chart reviewed as part of pre-operative protocol coverage. Because of Owenn Rothermel Krenzer's past medical history and time since last visit, he/she will require a follow-up visit in order to better assess preoperative cardiovascular risk.  Pre-op covering staff: - Pt has an appt with Dr. Tamala Julian on 11/19 - please see if this appt can be moved sooner or if he can get in with an APP and keep the 11/19 appt - he needs to have TKA before the end of the year - Please contact requesting surgeon's office via preferred method (i.e, phone, fax) to inform them of need for appointment prior to surgery.  If applicable, this message will also be routed to pharmacy pool and/or primary cardiologist for input on holding anticoagulant/antiplatelet agent as requested below so that this information is available at time of patient's appointment.   Linda, PA  01/15/2019, 8:40 AM

## 2019-01-23 DIAGNOSIS — I509 Heart failure, unspecified: Secondary | ICD-10-CM | POA: Diagnosis not present

## 2019-01-23 DIAGNOSIS — I10 Essential (primary) hypertension: Secondary | ICD-10-CM | POA: Diagnosis not present

## 2019-01-23 DIAGNOSIS — J449 Chronic obstructive pulmonary disease, unspecified: Secondary | ICD-10-CM | POA: Diagnosis not present

## 2019-01-26 ENCOUNTER — Ambulatory Visit: Payer: Self-pay | Admitting: Pharmacist

## 2019-01-26 NOTE — Progress Notes (Signed)
Cardiology Office Note:    Date:  01/27/2019   ID:  Mitchell [name], DOB 11-22-1940, MRN [id number]  PCP:  Daisy Floro, MD  Cardiologist:  Lesleigh Noe, MD   Referring MD: Daisy Floro, MD   Chief Complaint  Patient presents with  . Coronary Artery Disease  . Shortness of Breath  . Advice Only    COPD    History of Present Illness:    Mitchell [name] is a 78 y.o. male with a hx of hx ofcoronary artery diseasewith remote LAD/Diagonal cutting balloon PCI, history ofRCA?PDA DES, COPD, paroxysmal atrial flutter, chronic apixaban therapy, diastolic heart failure, claudication and hypertension.COPDhas progressed to requirement for home oxygen.  He is aggravated today because it is taken me some time to get to him.  Running late because of longer than expected prior visits with patient is having multiple problems.  I explained this to him and he was quite gracious.  He has not had any significant angina.  He has not required sublingual nitroglycerin.  He does have chronic shortness of breath.  He is worried that an upcoming knee operation will not be done because the last time he attempted to have surgery was canceled due to wheezing.  He denies orthopnea and PND.  He has not had lower extremity swelling.  When last evaluated, LV systolic function was normal.  Past Medical History:  Diagnosis Date  . Anginal pain (HCC) 01/2012  . Anxiety   . Atherosclerotic heart disease of native coronary artery without angina pectoris   . BPH (benign prostatic hypertrophy)   . CAD (coronary artery disease) 2007   with LAD/Diag CBPTCA and kissing   . CKD (chronic kidney disease), stage III    Mitchell [name] 01/04/2017  . COPD (chronic obstructive pulmonary disease) (HCC)    "maybe" (02/14/2012)  . Coronary artery disease   . Depression   . GERD (gastroesophageal reflux disease)   . Hyperlipidemia   . Hypertension   . Hypoxemia   . Myocardial infarction (HCC) 01/22/2012   NSTEMI with DES to RSA (PDA)  . On home oxygen therapy   . Shortness of breath    "walking and laying down sometimes" (02/14/2012)    Past Surgical History:  Procedure Laterality Date  . CATARACT EXTRACTION W/ INTRAOCULAR LENS  IMPLANT, BILATERAL  ~ 2010  . CORONARY ANGIOPLASTY WITH STENT PLACEMENT  05/11/2005   PCI OF LAD  . GANGLION CYST REMOVED  1956?   right  . LEFT HEART CATHETERIZATION WITH CORONARY ANGIOGRAM Bilateral 01/24/2012   Procedure: LEFT HEART CATHETERIZATION WITH CORONARY ANGIOGRAM;  Surgeon: Lesleigh Noe, MD;  Location: [facility] CATH LAB;  Service: Cardiovascular;  Laterality: Bilateral;  . REFRACTIVE SURGERY  2010   left    Current Medications: Current Meds  Medication Sig  . acetaminophen (ACETAMINOPHEN EXTRA STRENGTH) 500 MG tablet Take 500 mg by mouth every 6 (six) hours as needed.  Marland Kitchen albuterol (PROVENTIL HFA;VENTOLIN HFA) 108 (90 BASE) MCG/ACT inhaler Inhale 2 puffs into the lungs every 6 (six) hours as needed for wheezing. Reported on 05/04/2015  . aspirin EC 81 MG tablet Take 1 tablet (81 mg total) by mouth daily.  Marland Kitchen BISACODYL PO Take 1 tablet by mouth daily as needed.  . diclofenac (VOLTAREN) 75 MG EC tablet 75 mg. Take 2 tablets at the onset of gout then 1 tablet twice daily as needed.  Marland Kitchen ELIQUIS 5 MG TABS tablet Take 1 tablet by mouth twice daily  .  finasteride (PROSCAR) 5 MG tablet Take 5 mg by mouth daily. Reported on 05/04/2015  . furosemide (LASIX) 40 MG tablet Take 40 mg by mouth daily.   . metoprolol tartrate (LOPRESSOR) 25 MG tablet TAKE 1 TABLET TWICE DAILY  . mometasone-formoterol (DULERA) 100-5 MCG/ACT AERO Inhale 2 puffs into the lungs 2 (two) times daily.  . Multiple Vitamins-Minerals (EYE VITAMINS & MINERALS PO) Take 1 tablet by mouth daily.  . nitroGLYCERIN (NITROSTAT) 0.4 MG SL tablet Place 1 tablet (0.4 mg total) under the tongue every 5 (five) minutes as needed. Reported on 05/04/2015  . potassium chloride (KLOR-CON 10) 10 MEQ tablet Take 1  tablet (10 mEq total) by mouth daily.  . pravastatin (PRAVACHOL) 20 MG tablet TAKE 1/2 TABLET EVERY EVENING  . tamsulosin (FLOMAX) 0.4 MG CAPS capsule Take 0.4 mg by mouth daily after breakfast. Reported on 05/04/2015     Allergies:   Lipitor [atorvastatin], Statins, and Rosuvastatin   Social History   Socioeconomic History  . Marital status: Single    Spouse name: Not on file  . Number of children: 3  . Years of education: Not on file  . Highest education level: Not on file  Occupational History  . Not on file  Social Needs  . Financial resource strain: Not on file  . Food insecurity    Worry: Not on file    Inability: Not on file  . Transportation needs    Medical: Not on file    Non-medical: Not on file  Tobacco Use  . Smoking status: Former Smoker    Years: 42.00    Types: Pipe    Quit date: 04/28/2011    Years since quitting: 7.7  . Smokeless tobacco: Never Used  Substance and Sexual Activity  . Alcohol use: Yes    Alcohol/week: 7.0 standard drinks    Types: 7 Standard drinks or equivalent per week    Comment: 02/14/2012 "weekends I have 6-7 mixed drinks"     occasional  . Drug use: No  . Sexual activity: Yes  Lifestyle  . Physical activity    Days per week: Not on file    Minutes per session: Not on file  . Stress: Not on file  Relationships  . Social Musicianconnections    Talks on phone: Not on file    Gets together: Not on file    Attends religious service: Not on file    Active member of club or organization: Not on file    Attends meetings of clubs or organizations: Not on file    Relationship status: Not on file  Other Topics Concern  . Not on file  Social History Narrative   Truck driver        Family History: The patient's family history includes Heart failure in his brother and mother.  ROS:   Please see the history of present illness.    Is agitated at times.  Very limited by bilateral knee discomfort, right knee greater than left.  All other  systems reviewed and are negative.  EKGs/Labs/Other Studies Reviewed:    The following studies were reviewed today: 2D Doppler echocardiogram September 2018: ------------------------------------------------------------------- Study Conclusions  - Procedure narrative: Transthoracic echocardiography. Image   quality was suboptimal. The study was technically difficult, as a   result of poor acoustic windows, poor sound wave transmission,   and body habitus. - Left ventricle: The cavity size was normal. Systolic function was   probably normal. - Right ventricle: The cavity size was  mildly dilated. Wall   thickness was normal. - No significant valvular abnormality. Patient appeared to be in A.   Fibrillation. RV is mildly dilated but preserved systolic   function.  EKG:  EKG performed today demonstrated normal sinus rhythm with an overall normal appearance.  Normal PR interval.  Recent Labs: 02/05/2018: ALT 19  Recent Lipid Panel    Component Value Date/Time   CHOL 167 02/05/2018 0744   TRIG 83 02/05/2018 0744   HDL 54 02/05/2018 0744   CHOLHDL 3.1 02/05/2018 0744   CHOLHDL 4.9 01/12/2017 0503   VLDL 21 01/12/2017 0503   LDLCALC 96 02/05/2018 0744    Physical Exam:    VS:  BP 124/73   Pulse 69   Ht 5\' 9"  (1.753 m)   Wt 203 lb 12.8 oz (92.4 kg)   SpO2 97%   BMI 30.10 kg/m     Wt Readings from Last 3 Encounters:  01/27/19 203 lb 12.8 oz (92.4 kg)  08/07/18 214 lb 9.6 oz (97.3 kg)  12/25/17 214 lb 12.8 oz (97.4 kg)     GEN: He appears older than his stated age. No acute distress HEENT: Normal NECK: No JVD. LYMPHATICS: No lymphadenopathy CARDIAC:  RRR without murmur, gallop, or edema. VASCULAR:  Normal Pulses. No bruits. RESPIRATORY:  Clear to auscultation without rales, wheezing or rhonchi  ABDOMEN: Soft, non-tender, non-distended, No pulsatile mass, MUSCULOSKELETAL: No deformity  SKIN: Warm and dry NEUROLOGIC:  Alert and oriented x 3 PSYCHIATRIC:  Normal  affect   ASSESSMENT:    1. Coronary artery disease involving native coronary artery of native heart with angina pectoris ([place])   2. Essential hypertension   3. Stage 3a chronic kidney disease   4. Chronic diastolic heart failure ([place])   5. Preoperative cardiovascular examination   6. Angina pectoris ([place])   7. Typical atrial flutter ([place])   8. Educated about COVID-19 virus infection    PLAN:    In order of problems listed above:  1. Stable from cardiac standpoint.  Minimal if any episodes of chest discomfort.  No nitroglycerin use.  Secondary prevention discussed. 2. Target blood pressure 140/80 mmHg or less. 3. Not discussed 4. No evidence of volume overload 5. Cleared for upcoming knee procedure relative to his cardiac status.  He does have reactive airways disease and this could be an issue if he is actively wheezing on the day of the procedure.  Exam today is normal. 6. Nitroglycerin use is encouraged if he has chest pain 7. No recurrences 8. The 3W's were discussed, advocated, and endorsed by the patient is lifestyle changes.  No changes in therapy were made.  From cardiac standpoint he is cleared to undergo an upcoming knee operation assuming pulmonary status is stable on the day of the procedure.   Medication Adjustments/Labs and Tests Ordered: Current medicines are reviewed at length with the patient today.  Concerns regarding medicines are outlined above.  Orders Placed This Encounter  Procedures  . EKG 12-Lead   No orders of the defined types were placed in this encounter.   Patient Instructions  Medication Instructions:  Your physician recommends that you continue on your current medications as directed. Please refer to the Current Medication list given to you today.  If you need a refill on your cardiac medications before your next appointment, please call your pharmacy.   Lab work: None If you have labs (blood work) drawn today and your tests are completely  normal, you will receive your results  only by: Marland Kitchen MyChart Message (if you have MyChart) OR . A paper copy in the mail If you have any lab test that is abnormal or we need to change your treatment, we will call you to review the results.  Testing/Procedures: None  Follow-Up: At [facility] you and your health needs are our priority.  As part of our continuing mission to provide you with exceptional heart care, we have created designated Provider Care Teams.  These Care Teams include your primary Cardiologist (physician) and Advanced Practice Providers (APPs -  Physician Assistants and Nurse Practitioners) who all work together to provide you with the care you need, when you need it. You will need a follow up appointment in 12 months.  Please call our office 2 months in advance to schedule this appointment.  You may see Lesleigh Noe, MD or one of the following Advanced Practice Providers on your designated Care Team:   Norma Fredrickson, NP Nada Boozer, NP . Georgie Chard, NP  Any Other Special Instructions Will Be Listed Below (If Applicable).       Signed, Lesleigh Noe, MD  01/27/2019 6:05 PM    [facility]

## 2019-01-27 ENCOUNTER — Encounter (INDEPENDENT_AMBULATORY_CARE_PROVIDER_SITE_OTHER): Payer: Self-pay

## 2019-01-27 ENCOUNTER — Other Ambulatory Visit: Payer: Self-pay

## 2019-01-27 ENCOUNTER — Ambulatory Visit: Payer: Medicare PPO | Admitting: Interventional Cardiology

## 2019-01-27 ENCOUNTER — Encounter: Payer: Self-pay | Admitting: Interventional Cardiology

## 2019-01-27 VITALS — BP 124/73 | HR 69 | Ht 69.0 in | Wt 203.8 lb

## 2019-01-27 DIAGNOSIS — I209 Angina pectoris, unspecified: Secondary | ICD-10-CM

## 2019-01-27 DIAGNOSIS — N1831 Chronic kidney disease, stage 3a: Secondary | ICD-10-CM | POA: Diagnosis not present

## 2019-01-27 DIAGNOSIS — Z01810 Encounter for preprocedural cardiovascular examination: Secondary | ICD-10-CM

## 2019-01-27 DIAGNOSIS — Z7189 Other specified counseling: Secondary | ICD-10-CM

## 2019-01-27 DIAGNOSIS — I10 Essential (primary) hypertension: Secondary | ICD-10-CM | POA: Diagnosis not present

## 2019-01-27 DIAGNOSIS — I483 Typical atrial flutter: Secondary | ICD-10-CM | POA: Diagnosis not present

## 2019-01-27 DIAGNOSIS — I5032 Chronic diastolic (congestive) heart failure: Secondary | ICD-10-CM | POA: Diagnosis not present

## 2019-01-27 DIAGNOSIS — I25119 Atherosclerotic heart disease of native coronary artery with unspecified angina pectoris: Secondary | ICD-10-CM

## 2019-01-27 NOTE — Patient Instructions (Signed)

## 2019-02-02 ENCOUNTER — Other Ambulatory Visit: Payer: Self-pay | Admitting: Orthopedic Surgery

## 2019-02-06 ENCOUNTER — Telehealth: Payer: Self-pay | Admitting: Pharmacist

## 2019-02-06 ENCOUNTER — Other Ambulatory Visit: Payer: Self-pay | Admitting: Pharmacy Technician

## 2019-02-06 NOTE — Patient Outreach (Signed)
[place] [facility]) Care Management  02/06/2019  Perez Dirico [birth year] [id number]   Patient was called to follow up on medication assistance. HIPAA identifiers were obtained.   Patient said he was unaware of when he needed to get a refill on his Eliquis.  [place] Patient Assistance Program was called.  According to the representative, Eliquis was sent to the patient on 12/18/2018 for a 90 day supply.  The representative also said the dispensing pharmacy, [facility], automatically sends the patient refills.   In addition, we had been trying to get Zetia (ezetimibe) from [facility] Patient Assistance Program but the patient did not return the attestation form and Zetia has since been removed from his medication list by Dr. Tamala Julian.  Patient communicated understanding.  Plan: Close patient's pharmacy case since he did not have any other questions.  Elayne Guerin, PharmD, [place] Clinical Pharmacist [phone]

## 2019-02-06 NOTE — Patient Outreach (Signed)
[place] [facility]) Care Management  02/06/2019  Mitchell [name] [birth year] [id number]  Care coordination call placed to [name] in regards to patient's Zetia application.  Spoke to [place] who informed they have not received the attestation form back that was mailed to patient on 11/17/2018.  Patient's provider was sent another [name] application in September but it has not been received back either as we were restarting the process.  Per notes patient's Zetia was stopped in April and is not currently on his medication list  Will route note to [place] that after multiple attempts to have patient mail in attestation form this has not been completed so patient assistance case will be closed. Will remove myself form care team.  Mitchell [name]. Mitchell [name], [place] Management [phone]

## 2019-02-09 NOTE — Patient Instructions (Addendum)
DUE TO COVID-19 ONLY ONE VISITOR IS ALLOWED TO COME WITH YOU AND STAY IN THE WAITING ROOM ONLY DURING PRE OP AND PROCEDURE DAY OF SURGERY. THE 1 VISITOR MAY VISIT WITH YOU AFTER SURGERY IN YOUR PRIVATE ROOM DURING VISITING HOURS ONLY!  YOU NEED TO HAVE A COVID 19 TEST ON_______ @_______ , THIS TEST MUST BE DONE BEFORE SURGERY, COME  [place] , [address].  ([place]) ONCE YOUR COVID TEST IS COMPLETED, PLEASE BEGIN THE QUARANTINE INSTRUCTIONS AS OUTLINED IN YOUR HANDOUT.                Mitchell [name]  02/09/2019   Your procedure is scheduled on: 02-13-19    Report to [facility]  Entrance    Report to Short Stay at  5:30 AM     Call this number if you have problems the morning of surgery [phone]    Remember: NO SOLID FOOD AFTER MIDNIGHT THE NIGHT PRIOR TO SURGERY. NOTHING BY MOUTH EXCEPT CLEAR LIQUIDS UNTIL 4:30 AM . PLEASE FINISH ENSURE DRINK PER SURGEON ORDER  WHICH NEEDS TO BE COMPLETED AT 4:30 AM.   CLEAR LIQUID DIET   Foods Allowed                                                                     Foods Excluded  Coffee and tea, regular and decaf                             liquids that you cannot  Plain Jell-O any favor except red or purple                                           see through such as: Fruit ices (not with fruit pulp)                                     milk, soups, orange juice  Iced Popsicles                                    All solid food Carbonated beverages, regular and diet                                    Cranberry, grape and apple juices Sports drinks like Gatorade Lightly seasoned clear broth or consume(fat free) Sugar, honey syrup  Sample Menu Breakfast                                Lunch                                     Supper Cranberry juice  Beef broth                            Chicken broth Jell-O                                     Grape juice                            Apple juice Coffee or tea                        Jell-O                                      Popsicle                                                Coffee or tea                        Coffee or tea  _____________________________________________________________________  After Midnight.      Take these medicines the morning of surgery with A SIP OF WATER: Proscar, Metoprolol, Flomax,bring Albuterol inhaler.  BRUSH YOUR TEETH MORNING OF SURGERY AND RINSE YOUR MOUTH OUT, NO CHEWING GUM CANDY OR MINTS.                                You may not have any metal on your body including hair pins and              piercings     Do not wear jewelry, cologne, lotions, powders or perfumes, deodorant                        Men may shave face and neck.   Do not bring valuables to the hospital. [facility] IS NOT             RESPONSIBLE   FOR VALUABLES.  Contacts, dentures or bridgework may not be worn into surgery.  Leave suitcase in the car. After surgery it may be brought to your room.     Special Instructions: N/A              Please read over the following fact sheets you were given: _____________________________________________________________________             [facility] - Preparing for Surgery Before surgery, you can play an important role.  Because skin is not sterile, your skin needs to be as free of germs as possible.  You can reduce the number of germs on your skin by washing with CHG (chlorahexidine gluconate) soap before surgery.  CHG is an antiseptic cleaner which kills germs and bonds with the skin to continue killing germs even after washing. Please DO NOT use if you have an allergy to CHG or antibacterial soaps.  If your skin becomes reddened/irritated stop using the CHG and inform your nurse when you arrive at Short Stay. Do not shave (including legs and underarms) for at  least 48 hours prior to the first CHG shower.  You may shave your face/neck. Please  follow these instructions carefully:  1.  Shower with CHG Soap the night before surgery and the  morning of Surgery.  2.  If you choose to wash your hair, wash your hair first as usual with your  normal  shampoo.  3.  After you shampoo, rinse your hair and body thoroughly to remove the  shampoo.                           4.  Use CHG as you would any other liquid soap.  You can apply chg directly  to the skin and wash                       Gently with a scrungie or clean washcloth.  5.  Apply the CHG Soap to your body ONLY FROM THE NECK DOWN.   Do not use on face/ open                           Wound or open sores. Avoid contact with eyes, ears mouth and genitals (private parts).                       Wash face,  Genitals (private parts) with your normal soap.             6.  Wash thoroughly, paying special attention to the area where your surgery  will be performed.  7.  Thoroughly rinse your body with warm water from the neck down.  8.  DO NOT shower/wash with your normal soap after using and rinsing off  the CHG Soap.                9.  Pat yourself dry with a clean towel.            10.  Wear clean pajamas.            11.  Place clean sheets on your bed the night of your first shower and do not  sleep with pets. Day of Surgery : Do not apply any lotions/deodorants the morning of surgery.  Please wear clean clothes to the hospital/surgery center.  FAILURE TO FOLLOW THESE INSTRUCTIONS MAY RESULT IN THE CANCELLATION OF YOUR SURGERY PATIENT SIGNATURE_________________________________  NURSE SIGNATURE__________________________________  ________________________________________________________________________   Mitchell [name]  An incentive spirometer is a tool that can help keep your lungs clear and active. This tool measures how well you are filling your lungs with each breath. Taking long deep breaths may help reverse or decrease the chance of developing breathing (pulmonary) problems  (especially infection) following:  A long period of time when you are unable to move or be active. BEFORE THE PROCEDURE   If the spirometer includes an indicator to show your best effort, your nurse or respiratory therapist will set it to a desired goal.  If possible, sit up straight or lean slightly forward. Try not to slouch.  Hold the incentive spirometer in an upright position. INSTRUCTIONS FOR USE  1. Sit on the edge of your bed if possible, or sit up as far as you can in bed or on a chair. 2. Hold the incentive spirometer in an upright position. 3. Breathe out normally. 4. Place the mouthpiece in your mouth and seal your lips tightly around  it. 5. Breathe in slowly and as deeply as possible, raising the piston or the ball toward the top of the column. 6. Hold your breath for 3-5 seconds or for as long as possible. Allow the piston or ball to fall to the bottom of the column. 7. Remove the mouthpiece from your mouth and breathe out normally. 8. Rest for a few seconds and repeat Steps 1 through 7 at least 10 times every 1-2 hours when you are awake. Take your time and take a few normal breaths between deep breaths. 9. The spirometer may include an indicator to show your best effort. Use the indicator as a goal to work toward during each repetition. 10. After each set of 10 deep breaths, practice coughing to be sure your lungs are clear. If you have an incision (the cut made at the time of surgery), support your incision when coughing by placing a pillow or rolled up towels firmly against it. Once you are able to get out of bed, walk around indoors and cough well. You may stop using the incentive spirometer when instructed by your caregiver.  RISKS AND COMPLICATIONS  Take your time so you do not get dizzy or light-headed.  If you are in pain, you may need to take or ask for pain medication before doing incentive spirometry. It is harder to take a deep breath if you are having  pain. AFTER USE  Rest and breathe slowly and easily.  It can be helpful to keep track of a log of your progress. Your caregiver can provide you with a simple table to help with this. If you are using the spirometer at home, follow these instructions: [place] IF:   You are having difficultly using the spirometer.  You have trouble using the spirometer as often as instructed.  Your pain medication is not giving enough relief while using the spirometer.  You develop fever of 100.5 F (38.1 C) or higher. SEEK IMMEDIATE MEDICAL CARE IF:   You cough up bloody sputum that had not been present before.  You develop fever of 102 F (38.9 C) or greater.  You develop worsening pain at or near the incision site. MAKE SURE YOU:   Understand these instructions.  Will watch your condition.  Will get help right away if you are not doing well or get worse. Document Released: 08/13/2006 Document Revised: 06/25/2011 Document Reviewed: 10/14/2006 ExitCare Patient Information 2014 ExitCare, [place].   ________________________________________________________________________  WHAT IS A BLOOD TRANSFUSION? Blood Transfusion Information  A transfusion is the replacement of blood or some of its parts. Blood is made up of multiple cells which provide different functions.  Red blood cells carry oxygen and are used for blood loss replacement.  White blood cells fight against infection.  Platelets control bleeding.  Plasma helps clot blood.  Other blood products are available for specialized needs, such as hemophilia or other clotting disorders. BEFORE THE TRANSFUSION  Who gives blood for transfusions?   Healthy volunteers who are fully evaluated to make sure their blood is safe. This is blood bank blood. Transfusion therapy is the safest it has ever been in the practice of medicine. Before blood is taken from a donor, a complete history is taken to make sure that person has no history  of diseases nor engages in risky social behavior (examples are intravenous drug use or sexual activity with multiple partners). The donor's travel history is screened to minimize risk of transmitting infections, such as malaria. The donated  blood is tested for signs of infectious diseases, such as HIV and hepatitis. The blood is then tested to be sure it is compatible with you in order to minimize the chance of a transfusion reaction. If you or a relative donates blood, this is often done in anticipation of surgery and is not appropriate for emergency situations. It takes many days to process the donated blood. RISKS AND COMPLICATIONS Although transfusion therapy is very safe and saves many lives, the main dangers of transfusion include:   Getting an infectious disease.  Developing a transfusion reaction. This is an allergic reaction to something in the blood you were given. Every precaution is taken to prevent this. The decision to have a blood transfusion has been considered carefully by your caregiver before blood is given. Blood is not given unless the benefits outweigh the risks. AFTER THE TRANSFUSION  Right after receiving a blood transfusion, you will usually feel much better and more energetic. This is especially true if your red blood cells have gotten low (anemic). The transfusion raises the level of the red blood cells which carry oxygen, and this usually causes an energy increase.  The nurse administering the transfusion will monitor you carefully for complications. HOME CARE INSTRUCTIONS  No special instructions are needed after a transfusion. You may find your energy is better. Speak with your caregiver about any limitations on activity for underlying diseases you may have. SEEK MEDICAL CARE IF:   Your condition is not improving after your transfusion.  You develop redness or irritation at the intravenous (IV) site. SEEK IMMEDIATE MEDICAL CARE IF:  Any of the following symptoms  occur over the next 12 hours:  Shaking chills.  You have a temperature by mouth above 102 F (38.9 C), not controlled by medicine.  Chest, back, or muscle pain.  People around you feel you are not acting correctly or are confused.  Shortness of breath or difficulty breathing.  Dizziness and fainting.  You get a rash or develop hives.  You have a decrease in urine output.  Your urine turns a dark color or changes to pink, red, or brown. Any of the following symptoms occur over the next 10 days:  You have a temperature by mouth above 102 F (38.9 C), not controlled by medicine.  Shortness of breath.  Weakness after normal activity.  The white part of the eye turns yellow (jaundice).  You have a decrease in the amount of urine or are urinating less often.  Your urine turns a dark color or changes to pink, red, or brown. Document Released: 03/30/2000 Document Revised: 06/25/2011 Document Reviewed: 11/17/2007 [facility] Patient Information 2014 [place], [place].  _______________________________________________________________________

## 2019-02-10 ENCOUNTER — Ambulatory Visit (HOSPITAL_COMMUNITY): Payer: Medicare PPO | Admitting: Vascular Surgery

## 2019-02-10 ENCOUNTER — Other Ambulatory Visit (HOSPITAL_COMMUNITY)
Admission: RE | Admit: 2019-02-10 | Discharge: 2019-02-10 | Disposition: A | Discharge status: Home / Self Care | Payer: Medicare PPO | Source: Ambulatory Visit | Attending: Orthopedic Surgery | Admitting: Orthopedic Surgery

## 2019-02-10 ENCOUNTER — Ambulatory Visit (HOSPITAL_COMMUNITY): Payer: Medicare PPO | Admitting: Certified Registered Nurse Anesthetist

## 2019-02-10 DIAGNOSIS — Z20828 Contact with and (suspected) exposure to other viral communicable diseases: Secondary | ICD-10-CM | POA: Insufficient documentation

## 2019-02-10 DIAGNOSIS — Z01812 Encounter for preprocedural laboratory examination: Secondary | ICD-10-CM | POA: Insufficient documentation

## 2019-02-11 ENCOUNTER — Other Ambulatory Visit: Payer: Self-pay

## 2019-02-11 ENCOUNTER — Encounter (HOSPITAL_COMMUNITY)
Admission: RE | Admit: 2019-02-11 | Discharge: 2019-02-11 | Disposition: A | Discharge status: Home / Self Care | Payer: Medicare PPO | Source: Ambulatory Visit | Attending: Orthopedic Surgery | Admitting: Orthopedic Surgery

## 2019-02-11 ENCOUNTER — Encounter (HOSPITAL_COMMUNITY): Payer: Self-pay

## 2019-02-11 DIAGNOSIS — N189 Chronic kidney disease, unspecified: Secondary | ICD-10-CM | POA: Insufficient documentation

## 2019-02-11 DIAGNOSIS — Z7901 Long term (current) use of anticoagulants: Secondary | ICD-10-CM | POA: Diagnosis not present

## 2019-02-11 DIAGNOSIS — Z9981 Dependence on supplemental oxygen: Secondary | ICD-10-CM | POA: Insufficient documentation

## 2019-02-11 DIAGNOSIS — J449 Chronic obstructive pulmonary disease, unspecified: Secondary | ICD-10-CM | POA: Insufficient documentation

## 2019-02-11 DIAGNOSIS — Z79899 Other long term (current) drug therapy: Secondary | ICD-10-CM | POA: Diagnosis not present

## 2019-02-11 DIAGNOSIS — Z955 Presence of coronary angioplasty implant and graft: Secondary | ICD-10-CM | POA: Diagnosis not present

## 2019-02-11 DIAGNOSIS — I252 Old myocardial infarction: Secondary | ICD-10-CM | POA: Diagnosis not present

## 2019-02-11 DIAGNOSIS — Z7982 Long term (current) use of aspirin: Secondary | ICD-10-CM | POA: Diagnosis not present

## 2019-02-11 DIAGNOSIS — E785 Hyperlipidemia, unspecified: Secondary | ICD-10-CM | POA: Insufficient documentation

## 2019-02-11 DIAGNOSIS — I2510 Atherosclerotic heart disease of native coronary artery without angina pectoris: Secondary | ICD-10-CM | POA: Diagnosis not present

## 2019-02-11 DIAGNOSIS — Z01818 Encounter for other preprocedural examination: Secondary | ICD-10-CM | POA: Diagnosis not present

## 2019-02-11 DIAGNOSIS — I129 Hypertensive chronic kidney disease with stage 1 through stage 4 chronic kidney disease, or unspecified chronic kidney disease: Secondary | ICD-10-CM | POA: Insufficient documentation

## 2019-02-11 DIAGNOSIS — Z87891 Personal history of nicotine dependence: Secondary | ICD-10-CM | POA: Insufficient documentation

## 2019-02-11 DIAGNOSIS — M1711 Unilateral primary osteoarthritis, right knee: Secondary | ICD-10-CM | POA: Insufficient documentation

## 2019-02-11 LAB — COMPREHENSIVE METABOLIC PANEL
ALT: 15 U/L (ref 0–44)
AST: 17 U/L (ref 15–41)
Albumin: 3.8 g/dL (ref 3.5–5.0)
Alkaline Phosphatase: 69 U/L (ref 38–126)
Anion gap: 11 (ref 5–15)
BUN: 27 mg/dL — ABNORMAL HIGH (ref 8–23)
CO2: 28 mmol/L (ref 22–32)
Calcium: 9 mg/dL (ref 8.9–10.3)
Chloride: 101 mmol/L (ref 98–111)
Creatinine, Ser: 1.48 mg/dL — ABNORMAL HIGH (ref 0.61–1.24)
GFR calc Af Amer: 51 mL/min — ABNORMAL LOW (ref >60)
GFR calc non Af Amer: 44 mL/min — ABNORMAL LOW (ref >60)
Glucose, Bld: 98 mg/dL (ref 70–99)
Potassium: 4.4 mmol/L (ref 3.5–5.1)
Sodium: 140 mmol/L (ref 135–145)
Total Bilirubin: 0.7 mg/dL (ref 0.3–1.2)
Total Protein: 6.3 g/dL — ABNORMAL LOW (ref 6.5–8.1)

## 2019-02-11 LAB — CBC WITH DIFFERENTIAL/PLATELET
Abs Immature Granulocytes: 0.03 10*3/uL (ref 0.00–0.07)
Basophils Absolute: 0.1 10*3/uL (ref 0.0–0.1)
Basophils Relative: 1 %
Eosinophils Absolute: 0.2 10*3/uL (ref 0.0–0.5)
Eosinophils Relative: 3 %
HCT: 51.4 % (ref 39.0–52.0)
Hemoglobin: 16.2 g/dL (ref 13.0–17.0)
Immature Granulocytes: 0 %
Lymphocytes Relative: 14 %
Lymphs Abs: 1.2 10*3/uL (ref 0.7–4.0)
MCH: 28.2 pg (ref 26.0–34.0)
MCHC: 31.5 g/dL (ref 30.0–36.0)
MCV: 89.5 fL (ref 80.0–100.0)
Monocytes Absolute: 1 10*3/uL (ref 0.1–1.0)
Monocytes Relative: 12 %
Neutro Abs: 6 10*3/uL (ref 1.7–7.7)
Neutrophils Relative %: 70 %
Platelets: 177 10*3/uL (ref 150–400)
RBC: 5.74 MIL/uL (ref 4.22–5.81)
RDW: 14.6 % (ref 11.5–15.5)
WBC: 8.4 10*3/uL (ref 4.0–10.5)
nRBC: 0 % (ref 0.0–0.2)

## 2019-02-11 LAB — URINALYSIS, ROUTINE W REFLEX MICROSCOPIC
Bilirubin Urine: NEGATIVE
Glucose, UA: NEGATIVE mg/dL
Hgb urine dipstick: NEGATIVE
Ketones, ur: NEGATIVE mg/dL
Leukocytes,Ua: NEGATIVE
Nitrite: NEGATIVE
Protein, ur: NEGATIVE mg/dL
Specific Gravity, Urine: 1.006 (ref 1.005–1.030)
pH: 5 (ref 5.0–8.0)

## 2019-02-11 LAB — SURGICAL PCR SCREEN
MRSA, PCR: NEGATIVE
Staphylococcus aureus: POSITIVE — AB

## 2019-02-11 LAB — APTT: aPTT: 34 seconds (ref 24–36)

## 2019-02-11 LAB — NOVEL CORONAVIRUS, NAA (HOSP ORDER, SEND-OUT TO REF LAB; TAT 18-24 HRS): SARS-CoV-2, NAA: NOT DETECTED

## 2019-02-11 LAB — PROTIME-INR
INR: 1.1 (ref 0.8–1.2)
Prothrombin Time: 14.5 seconds (ref 11.4–15.2)

## 2019-02-11 LAB — ABO/RH: ABO/RH(D): A NEG

## 2019-02-11 MED ORDER — CHLORHEXIDINE GLUCONATE 4 % EX LIQD
60.0000 mL | Freq: Once | CUTANEOUS | Status: DC
  Filled 2019-02-11: qty 60

## 2019-02-11 NOTE — Progress Notes (Signed)
SPOKE W/  _ Dean     SCREENING SYMPTOMS OF COVID 19:   COUGH--NO  RUNNY NOSE--- NO  SORE THROAT---NO  NASAL CONGESTION----NO  SNEEZING----NO  SHORTNESS OF BREATH---NO  DIFFICULTY BREATHING---NO  TEMP >100.0 -----NO  UNEXPLAINED BODY ACHES------NO  CHILLS -------- NO  HEADACHES ---------NO  LOSS OF SMELL/ TASTE --------NO    HAVE YOU OR ANY FAMILY MEMBER TRAVELLED PAST 14 DAYS OUT OF THE   COUNTY---NO STATE----NO COUNTRY----NO  HAVE YOU OR ANY FAMILY MEMBER BEEN EXPOSED TO ANYONE WITH COVID 19?

## 2019-02-11 NOTE — Progress Notes (Signed)
PCP - Dr. Lona Kettle Cardiologist - Dr. Mallie Mussel Smith-LOV 1013/2020. Clearance note in Epic.  Chest x-ray - n/a EKG - 01/27/2019 Stress Test - 01/10/2017 ECHO - 01/10/2017 Cardiac Cath - n/a  Sleep Study - n/a CPAP - n/a  Fasting Blood Sugar - n/a Checks Blood Sugar _____ times a day  Blood Thinner Instructions:Eliquis- stop 5-7 days pre op. Stopped 02/10/2019.  Aspirin Instructions: Last Dose:  Anesthesia review: Yes, Given to Myra Gianotti, [place]  Patient denies shortness of breath, fever, cough and chest pain at PAT appointment   Patient verbalized understanding of instructions that were given to them at the PAT appointment. Patient was also instructed that they will need to review over the PAT instructions again at home before surgery.

## 2019-02-12 MED ORDER — BUPIVACAINE LIPOSOME 1.3 % IJ SUSP
20.0000 mL | INTRAMUSCULAR | Status: DC
  Filled 2019-02-12: qty 20

## 2019-02-12 NOTE — Anesthesia Preprocedure Evaluation (Addendum)
Anesthesia Evaluation  Patient identified by MRN, date of birth, ID band Patient awake    Reviewed: Allergy & Precautions, NPO status , Patient's Chart, lab work & pertinent test results, reviewed documented beta blocker date and time   Airway Mallampati: II  TM Distance: >3 FB Neck ROM: Full    Dental  (+) Upper Dentures, Lower Dentures   Pulmonary shortness of breath and with exertion, COPD (2-3L prn),  COPD inhaler and oxygen dependent, former smoker,    Pulmonary exam normal breath sounds clear to auscultation       Cardiovascular hypertension, Pt. on home beta blockers + CAD, + Past MI and + Cardiac Stents  Normal cardiovascular exam Rhythm:Regular Rate:Normal  ECG: NSR, rate 69   Neuro/Psych PSYCHIATRIC DISORDERS Anxiety Depression negative neurological ROS     GI/Hepatic negative GI ROS, Neg liver ROS,   Endo/Other  negative endocrine ROS  Renal/GU negative Renal ROS     Musculoskeletal  (+) Arthritis ,   Abdominal (+) + obese,   Peds  Hematology HLD   Anesthesia Other Findings DEGENERATIVE JOINT DISEASE RIGHT KNEE  Reproductive/Obstetrics                           Anesthesia Physical Anesthesia Plan  ASA: IV  Anesthesia Plan:    Post-op Pain Management:    Induction:   PONV Risk Score and Plan:   Airway Management Planned:   Additional Equipment:   Intra-op Plan:   Post-operative Plan:   Informed Consent:   Plan Discussed with:   Anesthesia Plan Comments: (Reviewed PAT note 02/11/2019, Konrad Felix, PA-C)       Anesthesia Quick Evaluation

## 2019-02-12 NOTE — Progress Notes (Signed)
Anesthesia Chart Review   Case: [id number] Date/Time: 02/13/19 0715   Procedure: TOTAL KNEE ARTHROPLASTY (Right Knee)   Anesthesia type: Spinal   Pre-op diagnosis: DEGENERATIVE JOINT DISEASE RIGHT KNEE   Location: [facility] / [facility]   Surgeon: Mitchell Leitz, MD      DISCUSSION:78 y.o. former smoker (quit 04/28/2011) with h/o HTN, HLD, GERD, BPH, CAD (remote LAD/Diagonal cutting balloon PCI, history ofRCA?PDA DES 2013), A-fib(on Eliquis), COPD (on home O2), CKD Stage III, DJD right knee scheduled for above procedure 02/13/2019 with Dr. Dorna [name].   Pt last seen by cardiologist, Dr. Daneen [name], 01/27/2019.  Per OV note, stable from cardiac standpoint, "Cleared for upcoming knee procedure relative to his cardiac status.  He does have reactive airways disease and this couldbe an issue if he is actively wheezing on the day of the procedure.  Exam today is normal."  Stable at PAT visit per nurse.   Per pharmacist, "Ok to hold Eliquis for 3 days prior to TKA per protocol." VS: BP 140/74   Pulse 64   Temp 36.8 C (Oral)   Resp 16   Ht 5\' 9"  (1.753 m)   Wt 93.9 kg   SpO2 95%   BMI 30.57 kg/m   PROVIDERS: Lawerance Cruel, MD is PCP   Mitchell Schick, MD is Cardiologist last seen 01/27/2019  LABS: Labs reviewed: Acceptable for surgery. (all labs ordered are listed, but only abnormal results are displayed)  Labs Reviewed  SURGICAL PCR SCREEN - Abnormal; Notable for the following components:      Result Value   Staphylococcus aureus POSITIVE (*)    All other components within normal limits  COMPREHENSIVE METABOLIC PANEL - Abnormal; Notable for the following components:   BUN 27 (*)    Creatinine, Ser 1.48 (*)    Total Protein 6.3 (*)    GFR calc non Af Amer 44 (*)    GFR calc Af Amer 51 (*)    All other components within normal limits  URINALYSIS, ROUTINE W REFLEX MICROSCOPIC - Abnormal; Notable for the following components:   Color, Urine STRAW (*)    All other components within  normal limits  APTT  CBC WITH DIFFERENTIAL/PLATELET  PROTIME-INR  TYPE AND SCREEN  ABO/RH     IMAGES:   EKG: 01/27/2019 Rate 69 bpm Normal sinus rhythm   CV: Myocardial Perfusion Imaging 08/26/2017   There was no ST segment deviation noted during stress.  No T wave inversion was noted during stress.  Defect 1: There is a large defect of moderate severity.  This is a low risk study.  Nuclear stress EF: 61%.   Large size, moderate intensity fixed inferior, inferoseptal and septal perfusion defect, likely attenuation artifact with adjacent bowel uptake. LVEF 61% with normal wall motion. This is a low risk study.  Echo 01/10/2017 Study Conclusions LV EF: 55% -   60% - Procedure narrative: Transthoracic echocardiography. Image   quality was suboptimal. The study was technically difficult, as a   result of poor acoustic windows, poor sound wave transmission,   and body habitus. - Left ventricle: The cavity size was normal. Systolic function was   probably normal. - Right ventricle: The cavity size was mildly dilated. Wall   thickness was normal. - No significant valvular abnormality. Patient appeared to be in A.   Fibrillation. RV is mildly dilated but preserved systolic   function. Past Medical History:  Diagnosis Date  . Anginal pain ([place]) 01/2012  . Anxiety   .  Atherosclerotic heart disease of native coronary artery without angina pectoris   . BPH (benign prostatic hypertrophy)   . CAD (coronary artery disease) 2007   with LAD/Diag CBPTCA and kissing   . CKD (chronic kidney disease), stage III    Mitchell [name] 01/04/2017  . COPD (chronic obstructive pulmonary disease) (HCC)    "maybe" (02/14/2012)  . Coronary artery disease   . Depression   . GERD (gastroesophageal reflux disease)   . Hyperlipidemia   . Hypertension   . Hypoxemia   . Myocardial infarction (HCC) 01/22/2012   NSTEMI with DES to RSA (PDA)  . On home oxygen therapy   . Shortness of breath     "walking and laying down sometimes" (02/14/2012)    Past Surgical History:  Procedure Laterality Date  . CATARACT EXTRACTION W/ INTRAOCULAR LENS  IMPLANT, BILATERAL  ~ 2010  . CORONARY ANGIOPLASTY WITH STENT PLACEMENT  05/11/2005   PCI OF LAD  . GANGLION CYST REMOVED  1956?   right  . LEFT HEART CATHETERIZATION WITH CORONARY ANGIOGRAM Bilateral 01/24/2012   Procedure: LEFT HEART CATHETERIZATION WITH CORONARY ANGIOGRAM;  Surgeon: Lesleigh Noe, MD;  Location: [facility] CATH LAB;  Service: Cardiovascular;  Laterality: Bilateral;  . REFRACTIVE SURGERY  2010   left    MEDICATIONS: . acetaminophen (ACETAMINOPHEN EXTRA STRENGTH) 500 MG tablet  . albuterol (PROVENTIL HFA;VENTOLIN HFA) 108 (90 BASE) MCG/ACT inhaler  . aspirin EC 81 MG tablet  . BISACODYL PO  . diclofenac (VOLTAREN) 75 MG EC tablet  . ELIQUIS 5 MG TABS tablet  . finasteride (PROSCAR) 5 MG tablet  . furosemide (LASIX) 40 MG tablet  . metoprolol tartrate (LOPRESSOR) 25 MG tablet  . mometasone-formoterol (DULERA) 100-5 MCG/ACT AERO  . Multiple Vitamins-Minerals (EYE VITAMINS & MINERALS PO)  . nitroGLYCERIN (NITROSTAT) 0.4 MG SL tablet  . potassium chloride (KLOR-CON 10) 10 MEQ tablet  . pravastatin (PRAVACHOL) 20 MG tablet  . tamsulosin (FLOMAX) 0.4 MG CAPS capsule   No current facility-administered medications for this encounter.    Mitchell [name] ON 02/13/2019] bupivacaine liposome (EXPAREL) 1.3 % injection 266 mg     Mitchell [name] [facility] Pre-Surgical Testing [phone] 02/12/19  12:16 PM

## 2019-02-13 ENCOUNTER — Ambulatory Visit (HOSPITAL_COMMUNITY)
Admission: RE | Admit: 2019-02-13 | Discharge: 2019-02-13 | Disposition: A | Discharge status: Home / Self Care | Payer: Medicare PPO | Attending: Orthopedic Surgery | Admitting: Orthopedic Surgery

## 2019-02-13 ENCOUNTER — Encounter (HOSPITAL_COMMUNITY)
Admission: RE | Disposition: A | Discharge status: Home / Self Care | Payer: Self-pay | Source: Home / Self Care | Attending: Orthopedic Surgery

## 2019-02-13 ENCOUNTER — Encounter (HOSPITAL_COMMUNITY): Payer: Self-pay | Admitting: *Deleted

## 2019-02-13 ENCOUNTER — Other Ambulatory Visit: Payer: Self-pay

## 2019-02-13 DIAGNOSIS — Z96651 Presence of right artificial knee joint: Secondary | ICD-10-CM | POA: Diagnosis not present

## 2019-02-13 DIAGNOSIS — Z7901 Long term (current) use of anticoagulants: Secondary | ICD-10-CM | POA: Insufficient documentation

## 2019-02-13 DIAGNOSIS — M1711 Unilateral primary osteoarthritis, right knee: Secondary | ICD-10-CM | POA: Diagnosis not present

## 2019-02-13 DIAGNOSIS — Z5309 Procedure and treatment not carried out because of other contraindication: Secondary | ICD-10-CM | POA: Insufficient documentation

## 2019-02-13 LAB — TYPE AND SCREEN
ABO/RH(D): A NEG
Antibody Screen: NEGATIVE

## 2019-02-13 SURGERY — ARTHROPLASTY, KNEE, TOTAL
Anesthesia: Spinal | Site: Knee | Laterality: Right

## 2019-02-13 MED ORDER — BUPIVACAINE HCL (PF) 0.5 % IJ SOLN
INTRAMUSCULAR | Status: AC
  Filled 2019-02-13: qty 30

## 2019-02-13 MED ORDER — POVIDONE-IODINE 10 % EX SWAB
2.0000 "application " | Freq: Once | CUTANEOUS | Status: AC
  Administered 2019-02-13: 2 via TOPICAL

## 2019-02-13 MED ORDER — HYDROCODONE-ACETAMINOPHEN 5-325 MG PO TABS: 1.0000 | ORAL_TABLET | Freq: Three times a day (TID) | ORAL | 0 refills | Status: DC | PRN

## 2019-02-13 MED ORDER — LACTATED RINGERS IV SOLN
INTRAVENOUS | Status: DC
  Administered 2019-02-13: 06:00:00 via INTRAVENOUS

## 2019-02-13 MED ORDER — ACETAMINOPHEN 500 MG PO TABS
1000.0000 mg | ORAL_TABLET | Freq: Once | ORAL | Status: AC
  Administered 2019-02-13: 1000 mg via ORAL
  Filled 2019-02-13: qty 2

## 2019-02-13 MED ORDER — TRANEXAMIC ACID-NACL 1000-0.7 MG/100ML-% IV SOLN
1000.0000 mg | INTRAVENOUS | Status: DC
  Filled 2019-02-13: qty 100

## 2019-02-13 MED ORDER — FENTANYL CITRATE (PF) 100 MCG/2ML IJ SOLN
INTRAMUSCULAR | Status: AC
  Filled 2019-02-13: qty 2

## 2019-02-13 MED ORDER — PROPOFOL 10 MG/ML IV BOLUS
INTRAVENOUS | Status: AC
  Filled 2019-02-13: qty 40

## 2019-02-13 MED ORDER — CEFAZOLIN SODIUM-DEXTROSE 2-4 GM/100ML-% IV SOLN
2.0000 g | INTRAVENOUS | Status: DC
  Filled 2019-02-13: qty 100

## 2019-02-13 MED ORDER — METHYLPREDNISOLONE ACETATE 40 MG/ML IJ SUSP
INTRAMUSCULAR | Status: AC
  Filled 2019-02-13: qty 2

## 2019-02-13 MED ORDER — ONDANSETRON HCL 4 MG/2ML IJ SOLN
INTRAMUSCULAR | Status: AC
  Filled 2019-02-13: qty 2

## 2019-02-13 NOTE — Progress Notes (Signed)
Mr. Pomplun surgery cancelled today. See Dr. Jackalyn Lombard note. Patient discharged with [facility].

## 2019-02-13 NOTE — Progress Notes (Addendum)
The patient presented this morning for right total knee arthroplasty.  He was supposed to have stopped his Eliquis 3 days ago.  There seems to be some tremendous confusion about whether this happened or not.  We had discussions with his ex-wife who is in charge of putting his pills out and she is uncertain as to whether the pills were put in his pillbox or not.  He feels that he took Eliquis up until yesterday.  In discussion with anesthesia they feel that his pulmonary situation is such that spinal anesthesia would be far superior for him.  Given the overall situation I felt that it was best not to proceed with surgery today.  I did offer the patient staying off of Eliquis over the weekend and doing his surgery Monday.  He did not wish to proceed with this plan.  He ultimately decided that he would like to back off and regroup and consider February.  I am certainly respectful of that decision.  After discussion we elected to inject his knee today.  He was injected with 80 mg/cc Depo-Medrol (2 cc) and 8 cc of half percent Marcaine plain.  We did this under sterile technique after obtaining verbal consent.  He was monitored in the preoperative area for 5 minutes after the injection to make sure there were no complications.  He will be discharged home at this point and we will see him back in the office sometime at the end of this year or early next year and we will reschedule him for surgical intervention.  His ride has been contacted and they will come pick him up and he will contact the office next week for further appointment.

## 2019-02-23 ENCOUNTER — Ambulatory Visit: Payer: Self-pay | Admitting: Pharmacist

## 2019-02-23 DIAGNOSIS — I10 Essential (primary) hypertension: Secondary | ICD-10-CM | POA: Diagnosis not present

## 2019-02-23 DIAGNOSIS — I509 Heart failure, unspecified: Secondary | ICD-10-CM | POA: Diagnosis not present

## 2019-02-23 DIAGNOSIS — J449 Chronic obstructive pulmonary disease, unspecified: Secondary | ICD-10-CM | POA: Diagnosis not present

## 2019-02-24 DIAGNOSIS — M25461 Effusion, right knee: Secondary | ICD-10-CM | POA: Diagnosis not present

## 2019-02-24 DIAGNOSIS — M1711 Unilateral primary osteoarthritis, right knee: Secondary | ICD-10-CM | POA: Diagnosis not present

## 2019-02-24 DIAGNOSIS — M25561 Pain in right knee: Secondary | ICD-10-CM | POA: Diagnosis not present

## 2019-03-02 ENCOUNTER — Encounter (HOSPITAL_COMMUNITY): Payer: Self-pay

## 2019-03-02 ENCOUNTER — Emergency Department (HOSPITAL_BASED_OUTPATIENT_CLINIC_OR_DEPARTMENT_OTHER): Payer: Medicare PPO

## 2019-03-02 ENCOUNTER — Emergency Department (HOSPITAL_COMMUNITY)
Admission: EM | Admit: 2019-03-02 | Discharge: 2019-03-02 | Disposition: A | Discharge status: Home / Self Care | Payer: Medicare PPO | Attending: Emergency Medicine | Admitting: Emergency Medicine

## 2019-03-02 ENCOUNTER — Other Ambulatory Visit: Payer: Self-pay

## 2019-03-02 DIAGNOSIS — J449 Chronic obstructive pulmonary disease, unspecified: Secondary | ICD-10-CM | POA: Diagnosis not present

## 2019-03-02 DIAGNOSIS — N183 Chronic kidney disease, stage 3 unspecified: Secondary | ICD-10-CM | POA: Insufficient documentation

## 2019-03-02 DIAGNOSIS — Z20828 Contact with and (suspected) exposure to other viral communicable diseases: Secondary | ICD-10-CM | POA: Diagnosis not present

## 2019-03-02 DIAGNOSIS — R2241 Localized swelling, mass and lump, right lower limb: Secondary | ICD-10-CM | POA: Insufficient documentation

## 2019-03-02 DIAGNOSIS — Z87891 Personal history of nicotine dependence: Secondary | ICD-10-CM | POA: Diagnosis not present

## 2019-03-02 DIAGNOSIS — I2510 Atherosclerotic heart disease of native coronary artery without angina pectoris: Secondary | ICD-10-CM | POA: Diagnosis not present

## 2019-03-02 DIAGNOSIS — R52 Pain, unspecified: Secondary | ICD-10-CM | POA: Diagnosis not present

## 2019-03-02 DIAGNOSIS — Y939 Activity, unspecified: Secondary | ICD-10-CM | POA: Diagnosis not present

## 2019-03-02 DIAGNOSIS — Z79899 Other long term (current) drug therapy: Secondary | ICD-10-CM | POA: Insufficient documentation

## 2019-03-02 DIAGNOSIS — Y999 Unspecified external cause status: Secondary | ICD-10-CM | POA: Diagnosis not present

## 2019-03-02 DIAGNOSIS — Y929 Unspecified place or not applicable: Secondary | ICD-10-CM | POA: Insufficient documentation

## 2019-03-02 DIAGNOSIS — Z7982 Long term (current) use of aspirin: Secondary | ICD-10-CM | POA: Insufficient documentation

## 2019-03-02 DIAGNOSIS — X58XXXA Exposure to other specified factors, initial encounter: Secondary | ICD-10-CM | POA: Diagnosis not present

## 2019-03-02 DIAGNOSIS — I252 Old myocardial infarction: Secondary | ICD-10-CM | POA: Insufficient documentation

## 2019-03-02 DIAGNOSIS — S8011XA Contusion of right lower leg, initial encounter: Secondary | ICD-10-CM

## 2019-03-02 DIAGNOSIS — S8991XA Unspecified injury of right lower leg, initial encounter: Secondary | ICD-10-CM | POA: Diagnosis present

## 2019-03-02 DIAGNOSIS — I129 Hypertensive chronic kidney disease with stage 1 through stage 4 chronic kidney disease, or unspecified chronic kidney disease: Secondary | ICD-10-CM | POA: Insufficient documentation

## 2019-03-02 DIAGNOSIS — Z7901 Long term (current) use of anticoagulants: Secondary | ICD-10-CM | POA: Diagnosis not present

## 2019-03-02 LAB — COMPREHENSIVE METABOLIC PANEL
ALT: 18 U/L (ref 0–44)
AST: 18 U/L (ref 15–41)
Albumin: 3.6 g/dL (ref 3.5–5.0)
Alkaline Phosphatase: 69 U/L (ref 38–126)
Anion gap: 10 (ref 5–15)
BUN: 23 mg/dL (ref 8–23)
CO2: 30 mmol/L (ref 22–32)
Calcium: 9.2 mg/dL (ref 8.9–10.3)
Chloride: 97 mmol/L — ABNORMAL LOW (ref 98–111)
Creatinine, Ser: 1.35 mg/dL — ABNORMAL HIGH (ref 0.61–1.24)
GFR calc Af Amer: 57 mL/min — ABNORMAL LOW (ref >60)
GFR calc non Af Amer: 50 mL/min — ABNORMAL LOW (ref >60)
Glucose, Bld: 119 mg/dL — ABNORMAL HIGH (ref 70–99)
Potassium: 3.5 mmol/L (ref 3.5–5.1)
Sodium: 137 mmol/L (ref 135–145)
Total Bilirubin: 1.1 mg/dL (ref 0.3–1.2)
Total Protein: 6.7 g/dL (ref 6.5–8.1)

## 2019-03-02 LAB — CBC WITH DIFFERENTIAL/PLATELET
Abs Immature Granulocytes: 0.05 10*3/uL (ref 0.00–0.07)
Basophils Absolute: 0.1 10*3/uL (ref 0.0–0.1)
Basophils Relative: 1 %
Eosinophils Absolute: 0.3 10*3/uL (ref 0.0–0.5)
Eosinophils Relative: 3 %
HCT: 46.5 % (ref 39.0–52.0)
Hemoglobin: 14.7 g/dL (ref 13.0–17.0)
Immature Granulocytes: 1 %
Lymphocytes Relative: 13 %
Lymphs Abs: 1.1 10*3/uL (ref 0.7–4.0)
MCH: 29 pg (ref 26.0–34.0)
MCHC: 31.6 g/dL (ref 30.0–36.0)
MCV: 91.7 fL (ref 80.0–100.0)
Monocytes Absolute: 1 10*3/uL (ref 0.1–1.0)
Monocytes Relative: 11 %
Neutro Abs: 6.2 10*3/uL (ref 1.7–7.7)
Neutrophils Relative %: 71 %
Platelets: 265 10*3/uL (ref 150–400)
RBC: 5.07 MIL/uL (ref 4.22–5.81)
RDW: 14.7 % (ref 11.5–15.5)
WBC: 8.6 10*3/uL (ref 4.0–10.5)
nRBC: 0 % (ref 0.0–0.2)

## 2019-03-02 NOTE — Discharge Instructions (Addendum)
Elevate your leg.  Follow up with your Physician as scheduled. Tylenol for pain

## 2019-03-02 NOTE — ED Notes (Signed)
Pt's son's phone number is [phone] to call for D/C.

## 2019-03-02 NOTE — ED Notes (Addendum)
Pt refused COVID test.  Stated he had a test last week

## 2019-03-02 NOTE — ED Provider Notes (Signed)
[facility]-EMERGENCY DEPT Provider Note   CSN: [id number] Arrival date & time: 03/02/19  1242     History   Chief Complaint Chief Complaint  Patient presents with   Leg Swelling   leg discoloration    HPI Mitchell [name] is a 78 y.o. male.     The history is provided by the patient. No language interpreter was used.  Leg Pain Location:  Leg Time since incident:  2 days Injury: no   Leg location:  R leg Pain details:    Quality:  Aching   Radiates to:  Does not radiate   Severity:  Moderate   Onset quality:  Gradual   Progression:  Worsening Chronicity:  New Dislocation: no   Foreign body present:  No foreign bodies Prior injury to area:  No Relieved by:  Nothing Ineffective treatments:  None tried Associated symptoms: no back pain   Risk factors: no concern for non-accidental trauma   Pt reports he did a lot of work over the wekend and leg has become increasingly bruised and swollen.  Pt is on elequis due to CAD.  Pt reports he was suppose to have surgery on his knee.  Pt states Dr. Celene Krasancelled surgery.  (Pt had not stopped elequis)   Past Medical History:  Diagnosis Date   Anginal pain (HCC) 01/2012   Anxiety    Atherosclerotic heart disease of native coronary artery without angina pectoris    BPH (benign prostatic hypertrophy)    CAD (coronary artery disease) 2007   with LAD/Diag CBPTCA and kissing    CKD (chronic kidney disease), stage III    /notes 01/04/2017   COPD (chronic obstructive pulmonary disease) (HCC)    "maybe" (02/14/2012)   Coronary artery disease    Depression    GERD (gastroesophageal reflux disease)    Hyperlipidemia    Hypertension    Hypoxemia    Myocardial infarction (HCC) 01/22/2012   NSTEMI with DES to RSA (PDA)   On home oxygen therapy    Shortness of breath    "walking and laying down sometimes" (02/14/2012)    Patient Active Problem List   Diagnosis Date Noted   Effusion, right knee  01/10/2017   Osteoarthritis of right knee 01/10/2017   Gout of right knee 01/10/2017   Dehydration    Atypical atrial flutter (HCC)    Dizziness 01/04/2017   Right shoulder pain 01/04/2017   Hypokalemia 01/04/2017   Influenza 05/10/2016   CKD (chronic kidney disease), stage III 05/10/2016   Acute respiratory failure with hypoxia (HCC) 05/02/2016   AKI (acute kidney injury) (HCC) 05/02/2016   Gout flare 05/02/2016   Chronic respiratory failure (HCC) 03/15/2015   Vision impairment 01/31/2015   Right foot pain 01/31/2015   Obesity 01/31/2015   Alcohol use 01/31/2015   COPD (chronic obstructive pulmonary disease) (HCC) 04/30/2013   Dyspnea 02/14/2012   Hyperlipemia    History of depression    Chest pain 01/23/2012   Coronary artery disease involving native coronary artery of native heart with angina pectoris (HCC) 01/23/2012   Cholelithiasis    Essential hypertension 04/29/2011    Past Surgical History:  Procedure Laterality Date   CATARACT EXTRACTION W/ INTRAOCULAR LENS  IMPLANT, BILATERAL  ~ 2010   CORONARY ANGIOPLASTY WITH STENT PLACEMENT  05/11/2005   PCI OF LAD   GANGLION CYST REMOVED  1956?   right   LEFT HEART CATHETERIZATION WITH CORONARY ANGIOGRAM Bilateral 01/24/2012   Procedure: LEFT HEART CATHETERIZATION WITH CORONARY  Rosalin Hawking;  Surgeon: Lesleigh Noe, MD;  Location: [facility] CATH LAB;  Service: Cardiovascular;  Laterality: Bilateral;   REFRACTIVE SURGERY  2010   left        Home Medications    Prior to Admission medications   Medication Sig Start Date End Date Taking? Authorizing Provider  acetaminophen (ACETAMINOPHEN EXTRA STRENGTH) 500 MG tablet Take 500 mg by mouth every 6 (six) hours as needed.    [provider]  albuterol (PROVENTIL HFA;VENTOLIN HFA) 108 (90 BASE) MCG/ACT inhaler Inhale 2 puffs into the lungs every 6 (six) hours as needed for wheezing. Reported on 05/04/2015    [provider]  aspirin EC 81  MG tablet Take 1 tablet (81 mg total) by mouth daily. 12/03/16   Lyn Records, MD  BISACODYL PO Take 1 tablet by mouth daily as needed.    [provider]  diclofenac (VOLTAREN) 75 MG EC tablet 75 mg. Take 2 tablets at the onset of gout then 1 tablet twice daily as needed.    [provider]  ELIQUIS 5 MG TABS tablet Take 1 tablet by mouth twice daily 06/20/18   Lyn Records, MD  finasteride (PROSCAR) 5 MG tablet Take 5 mg by mouth daily. Reported on 05/04/2015    [provider]  furosemide (LASIX) 40 MG tablet Take 40 mg by mouth daily.  05/29/18   [provider]  HYDROcodone-acetaminophen (NORCO) 5-325 MG tablet Take 1 tablet by mouth every 8 (eight) hours as needed for severe pain. 02/13/19   Marshia Ly, PA-C  metoprolol tartrate (LOPRESSOR) 25 MG tablet TAKE 1 TABLET TWICE DAILY 01/02/19   Leone Brand, NP  mometasone-formoterol (DULERA) 100-5 MCG/ACT AERO Inhale 2 puffs into the lungs 2 (two) times daily. 02/08/15   Oretha Milch, MD  Multiple Vitamins-Minerals (EYE VITAMINS & MINERALS PO) Take 1 tablet by mouth daily.    [provider]  nitroGLYCERIN (NITROSTAT) 0.4 MG SL tablet Place 1 tablet (0.4 mg total) under the tongue every 5 (five) minutes as needed. Reported on 05/04/2015 08/20/17   Leone Brand, NP  potassium chloride (KLOR-CON 10) 10 MEQ tablet Take 1 tablet (10 mEq total) by mouth daily. 12/25/17   Lyn Records, MD  pravastatin (PRAVACHOL) 20 MG tablet TAKE 1/2 TABLET EVERY EVENING 08/07/18   Lyn Records, MD  tamsulosin [facility]) 0.4 MG CAPS capsule Take 0.4 mg by mouth daily after breakfast. Reported on 05/04/2015 03/19/13   [provider]    Family History Family History  Problem Relation Age of Onset   Heart failure Mother    Heart failure Brother     Social History Social History   Tobacco Use   Smoking status: Former Smoker    Years: 42.00    Types: Pipe    Quit date: 04/28/2011    Years since  quitting: 7.8   Smokeless tobacco: Never Used  Substance Use Topics   Alcohol use: Yes    Alcohol/week: 7.0 standard drinks    Types: 7 Standard drinks or equivalent per week    Comment: 02/14/2012 "weekends I have 6-7 mixed drinks"     occasional   Drug use: No     Allergies   Lipitor [atorvastatin], Statins, and Rosuvastatin   Review of Systems Review of Systems  Musculoskeletal: Negative for back pain.  All other systems reviewed and are negative.    Physical Exam Updated Vital Signs BP (!) 151/85 (BP Location: Left Arm)  Pulse 67    Temp 97.8 F (36.6 C) (Oral)    Resp 18    Ht  (1.753 m)    Wt 93.9 kg    SpO2 100%    BMI 30.57 kg/m   Physical Exam Vitals signs reviewed.  HENT:     Head: Normocephalic.     Right Ear: Tympanic membrane normal.  Eyes:     Pupils: Pupils are equal, round, and reactive to light.  Neck:     Musculoskeletal: Normal range of motion.  Cardiovascular:     Rate and Rhythm: Normal rate.     Pulses: Normal pulses.  Pulmonary:     Effort: Pulmonary effort is normal.     Breath sounds: Normal breath sounds.  Abdominal:     General: Abdomen is flat.  Musculoskeletal:        General: Swelling and tenderness present.     Comments: Bruised right upper leg, discolored right foot, swollen mid leg,    Skin:    General: Skin is warm.  Neurological:     General: No focal deficit present.  Psychiatric:        Mood and Affect: Mood normal.      ED Treatments / Results  Labs (all labs ordered are listed, but only abnormal results are displayed) Labs Reviewed  COMPREHENSIVE METABOLIC PANEL - Abnormal; Notable for the following components:      Result Value   Chloride 97 (*)    Glucose, Bld 119 (*)    Creatinine, Ser 1.35 (*)    GFR calc non Af Amer 50 (*)    GFR calc Af Amer 57 (*)    All other components within normal limits  SARS CORONAVIRUS 2 (TAT 6-24 HRS)  CBC WITH DIFFERENTIAL/PLATELET    EKG None  Radiology Vas  [place] Lower Extremity Venous (dvt) ([facility] 7a-7p)  Result Date: 03/02/2019  Lower Venous Study Indications: Pain, and Bruising.  Risk Factors: None identified. Anticoagulation: Eliquis. Comparison Study: No prior studies. Performing Technologist: Chanda Busing RVT  Examination Guidelines: A complete evaluation includes B-mode imaging, spectral Doppler, color Doppler, and power Doppler as needed of all accessible portions of each vessel. Bilateral testing is considered an integral part of a complete examination. Limited examinations for reoccurring indications may be performed as noted.  +---------+---------------+---------+-----------+----------+--------------+  RIGHT     Compressibility Phasicity Spontaneity Properties Thrombus Aging  +---------+---------------+---------+-----------+----------+--------------+  CFV       Full            Yes       Yes                                    +---------+---------------+---------+-----------+----------+--------------+  SFJ       Full                                                             +---------+---------------+---------+-----------+----------+--------------+  FV Prox   Full                                                             +---------+---------------+---------+-----------+----------+--------------+  FV Mid    Full                                                             +---------+---------------+---------+-----------+----------+--------------+  FV Distal Full                                                             +---------+---------------+---------+-----------+----------+--------------+  PFV       Full                                                             +---------+---------------+---------+-----------+----------+--------------+  POP       Full            Yes       Yes                                    +---------+---------------+---------+-----------+----------+--------------+  PTV       Full                                                              +---------+---------------+---------+-----------+----------+--------------+  PERO      Full                                                             +---------+---------------+---------+-----------+----------+--------------+ Waveforms in the superfial femoral, popliteal, posterior tibial, peroneal, and anterior tibial arteries are noted to be triphasic.  +----+---------------+---------+-----------+----------+--------------+  LEFT Compressibility Phasicity Spontaneity Properties Thrombus Aging  +----+---------------+---------+-----------+----------+--------------+  CFV  Full            Yes       Yes                                    +----+---------------+---------+-----------+----------+--------------+     Summary: Right: There is no evidence of deep vein thrombosis in the lower extremity. No cystic structure found in the popliteal fossa. Waveforms in the superfial femoral, popliteal, posterior tibial, peroneal, and anterior tibial arteries are noted to be triphasic. Left: No evidence of common femoral vein obstruction.  *See table(s) above for measurements and observations.    Preliminary     Procedures Procedures (including critical care time)  Medications Ordered in ED Medications - No data to display   Initial Impression / Assessment and Plan / ED Course  I have reviewed the triage vital signs and  the nursing notes.  Pertinent labs & imaging results that were available during my care of the patient were reviewed by me and considered in my medical decision making (see chart for details).        MDM  I spoke with [place] tech.  Pt has good arterial and venous flow.  Labs reviewed  Hemoglobin stable Dr. Lynelle Doctor in to see and examine pt.   Pt advised to elevate his leg.  Follow up with primary care MD   Final Clinical Impressions(s) / ED Diagnoses   Final diagnoses:  Contusion of right leg, initial encounter    ED Discharge Orders    None    An After Visit Summary  was printed and given to the patient.    Elson Areas, [place] 03/02/19 1635    Linwood Dibbles, MD 03/03/19 [phone]

## 2019-03-02 NOTE — ED Notes (Signed)
Pt back in room from vascular ?

## 2019-03-02 NOTE — Progress Notes (Signed)
Right lower extremity venous duplex has been completed. Preliminary results can be found in CV Proc through chart review.  Results were given to [facility] PA.  03/02/19 2:54 PM Carlos Levering RVT

## 2019-03-05 ENCOUNTER — Ambulatory Visit: Payer: Medicare PPO | Admitting: Interventional Cardiology

## 2019-03-05 DIAGNOSIS — M25571 Pain in right ankle and joints of right foot: Secondary | ICD-10-CM | POA: Diagnosis not present

## 2019-03-05 DIAGNOSIS — M1711 Unilateral primary osteoarthritis, right knee: Secondary | ICD-10-CM | POA: Diagnosis not present

## 2019-03-09 ENCOUNTER — Other Ambulatory Visit: Payer: Self-pay | Admitting: Interventional Cardiology

## 2019-03-09 ENCOUNTER — Telehealth: Payer: Self-pay

## 2019-03-09 MED ORDER — PRAVASTATIN SODIUM 20 MG PO TABS: ORAL_TABLET | ORAL | 3 refills | Status: DC

## 2019-03-09 NOTE — Telephone Encounter (Signed)
**Note De-Identified  Obfuscation** A [name] pt asst application was mailed to the office for the pt but he is not taking any medications made by [employer].  I called the pt to discuss but he stated that [name] his ex wife handles all of that for him and asked that I contact her at [phone].  I called [name] and she advised me that the [name] application was for the Pts Eliquis. I explained that [employer] is the maker of Eliquis and that the pt will need to apply for assistance through them for his Eliquis.  I offered [name] [employer] pt asst phone number but she said she could not write the # down at the moment and asked that I call her back and leave it in a message on her VM which I have done.  [name] is aware to ask [employer] to mail the pt an application.

## 2019-03-09 NOTE — Telephone Encounter (Signed)
Ok to fill 

## 2019-03-09 NOTE — Telephone Encounter (Signed)
Medication has to be printed and faxed to pharmacy. Thanks

## 2019-03-09 NOTE — Telephone Encounter (Signed)
Pt's caregiver calling requesting a refill on pravastatin, faxed to [facility]. Please address

## 2019-03-10 MED ORDER — PRAVASTATIN SODIUM 20 MG PO TABS: ORAL_TABLET | ORAL | 3 refills | Status: DC

## 2019-03-11 MED ORDER — PRAVASTATIN SODIUM 20 MG PO TABS: ORAL_TABLET | ORAL | 3 refills | Status: DC

## 2019-03-11 NOTE — Telephone Encounter (Signed)
Pt's prescription for pravastatin was faxed to [facility] at Fax# [phone], after calling [facility] at [facility] [phone] and this is the Faxed number that they provided. Confirmation received.

## 2019-03-25 DIAGNOSIS — I10 Essential (primary) hypertension: Secondary | ICD-10-CM | POA: Diagnosis not present

## 2019-03-25 DIAGNOSIS — J449 Chronic obstructive pulmonary disease, unspecified: Secondary | ICD-10-CM | POA: Diagnosis not present

## 2019-03-25 DIAGNOSIS — I509 Heart failure, unspecified: Secondary | ICD-10-CM | POA: Diagnosis not present

## 2019-03-27 DIAGNOSIS — M109 Gout, unspecified: Secondary | ICD-10-CM | POA: Diagnosis not present

## 2019-04-08 DIAGNOSIS — M109 Gout, unspecified: Secondary | ICD-10-CM | POA: Diagnosis not present

## 2019-04-22 DIAGNOSIS — M109 Gout, unspecified: Secondary | ICD-10-CM | POA: Diagnosis not present

## 2019-04-25 DIAGNOSIS — J449 Chronic obstructive pulmonary disease, unspecified: Secondary | ICD-10-CM | POA: Diagnosis not present

## 2019-04-25 DIAGNOSIS — I10 Essential (primary) hypertension: Secondary | ICD-10-CM | POA: Diagnosis not present

## 2019-04-25 DIAGNOSIS — I509 Heart failure, unspecified: Secondary | ICD-10-CM | POA: Diagnosis not present

## 2019-04-29 DIAGNOSIS — M109 Gout, unspecified: Secondary | ICD-10-CM | POA: Diagnosis not present

## 2019-04-30 DIAGNOSIS — Z7901 Long term (current) use of anticoagulants: Secondary | ICD-10-CM | POA: Diagnosis not present

## 2019-04-30 DIAGNOSIS — Z634 Disappearance and death of family member: Secondary | ICD-10-CM | POA: Diagnosis not present

## 2019-04-30 DIAGNOSIS — Z9119 Patient's noncompliance with other medical treatment and regimen: Secondary | ICD-10-CM | POA: Diagnosis not present

## 2019-05-06 DIAGNOSIS — M109 Gout, unspecified: Secondary | ICD-10-CM | POA: Diagnosis not present

## 2019-05-13 DIAGNOSIS — M109 Gout, unspecified: Secondary | ICD-10-CM | POA: Diagnosis not present

## 2019-05-20 DIAGNOSIS — M109 Gout, unspecified: Secondary | ICD-10-CM | POA: Diagnosis not present

## 2019-05-25 DIAGNOSIS — M109 Gout, unspecified: Secondary | ICD-10-CM | POA: Diagnosis not present

## 2019-05-26 DIAGNOSIS — I509 Heart failure, unspecified: Secondary | ICD-10-CM | POA: Diagnosis not present

## 2019-05-26 DIAGNOSIS — I10 Essential (primary) hypertension: Secondary | ICD-10-CM | POA: Diagnosis not present

## 2019-05-26 DIAGNOSIS — J449 Chronic obstructive pulmonary disease, unspecified: Secondary | ICD-10-CM | POA: Diagnosis not present

## 2019-05-28 DIAGNOSIS — N400 Enlarged prostate without lower urinary tract symptoms: Secondary | ICD-10-CM | POA: Diagnosis not present

## 2019-05-28 DIAGNOSIS — N5089 Other specified disorders of the male genital organs: Secondary | ICD-10-CM | POA: Diagnosis not present

## 2019-05-29 ENCOUNTER — Other Ambulatory Visit (HOSPITAL_BASED_OUTPATIENT_CLINIC_OR_DEPARTMENT_OTHER): Payer: Self-pay | Admitting: Family Medicine

## 2019-05-29 DIAGNOSIS — N5089 Other specified disorders of the male genital organs: Secondary | ICD-10-CM

## 2019-06-01 ENCOUNTER — Ambulatory Visit (HOSPITAL_BASED_OUTPATIENT_CLINIC_OR_DEPARTMENT_OTHER)
Admission: RE | Admit: 2019-06-01 | Discharge: 2019-06-01 | Disposition: A | Discharge status: Home / Self Care | Payer: Medicare PPO | Source: Ambulatory Visit | Attending: Family Medicine | Admitting: Family Medicine

## 2019-06-01 ENCOUNTER — Other Ambulatory Visit: Payer: Self-pay

## 2019-06-01 DIAGNOSIS — N433 Hydrocele, unspecified: Secondary | ICD-10-CM | POA: Diagnosis not present

## 2019-06-01 DIAGNOSIS — N5089 Other specified disorders of the male genital organs: Secondary | ICD-10-CM

## 2019-06-11 DIAGNOSIS — M109 Gout, unspecified: Secondary | ICD-10-CM | POA: Diagnosis not present

## 2019-06-15 DIAGNOSIS — M109 Gout, unspecified: Secondary | ICD-10-CM | POA: Diagnosis not present

## 2019-06-22 ENCOUNTER — Emergency Department (HOSPITAL_BASED_OUTPATIENT_CLINIC_OR_DEPARTMENT_OTHER): Payer: Medicare PPO

## 2019-06-22 ENCOUNTER — Other Ambulatory Visit: Payer: Self-pay

## 2019-06-22 ENCOUNTER — Encounter (HOSPITAL_BASED_OUTPATIENT_CLINIC_OR_DEPARTMENT_OTHER): Payer: Self-pay | Admitting: *Deleted

## 2019-06-22 ENCOUNTER — Emergency Department (HOSPITAL_BASED_OUTPATIENT_CLINIC_OR_DEPARTMENT_OTHER)
Admission: EM | Admit: 2019-06-22 | Discharge: 2019-06-22 | Disposition: A | Discharge status: Home / Self Care | Payer: Medicare PPO | Attending: Emergency Medicine | Admitting: Emergency Medicine

## 2019-06-22 DIAGNOSIS — Z9981 Dependence on supplemental oxygen: Secondary | ICD-10-CM | POA: Insufficient documentation

## 2019-06-22 DIAGNOSIS — N183 Chronic kidney disease, stage 3 unspecified: Secondary | ICD-10-CM | POA: Diagnosis not present

## 2019-06-22 DIAGNOSIS — K8020 Calculus of gallbladder without cholecystitis without obstruction: Secondary | ICD-10-CM | POA: Insufficient documentation

## 2019-06-22 DIAGNOSIS — Z79899 Other long term (current) drug therapy: Secondary | ICD-10-CM | POA: Insufficient documentation

## 2019-06-22 DIAGNOSIS — I2510 Atherosclerotic heart disease of native coronary artery without angina pectoris: Secondary | ICD-10-CM | POA: Diagnosis not present

## 2019-06-22 DIAGNOSIS — Z9861 Coronary angioplasty status: Secondary | ICD-10-CM | POA: Diagnosis not present

## 2019-06-22 DIAGNOSIS — Z7901 Long term (current) use of anticoagulants: Secondary | ICD-10-CM | POA: Insufficient documentation

## 2019-06-22 DIAGNOSIS — I252 Old myocardial infarction: Secondary | ICD-10-CM | POA: Insufficient documentation

## 2019-06-22 DIAGNOSIS — Z87891 Personal history of nicotine dependence: Secondary | ICD-10-CM | POA: Diagnosis not present

## 2019-06-22 DIAGNOSIS — J449 Chronic obstructive pulmonary disease, unspecified: Secondary | ICD-10-CM | POA: Insufficient documentation

## 2019-06-22 DIAGNOSIS — I129 Hypertensive chronic kidney disease with stage 1 through stage 4 chronic kidney disease, or unspecified chronic kidney disease: Secondary | ICD-10-CM | POA: Diagnosis not present

## 2019-06-22 DIAGNOSIS — Z7982 Long term (current) use of aspirin: Secondary | ICD-10-CM | POA: Diagnosis not present

## 2019-06-22 DIAGNOSIS — N433 Hydrocele, unspecified: Secondary | ICD-10-CM | POA: Diagnosis not present

## 2019-06-22 DIAGNOSIS — E785 Hyperlipidemia, unspecified: Secondary | ICD-10-CM | POA: Diagnosis not present

## 2019-06-22 DIAGNOSIS — N5089 Other specified disorders of the male genital organs: Secondary | ICD-10-CM | POA: Diagnosis not present

## 2019-06-22 LAB — CBC WITH DIFFERENTIAL/PLATELET
Abs Immature Granulocytes: 0.01 10*3/uL (ref 0.00–0.07)
Basophils Absolute: 0.1 10*3/uL (ref 0.0–0.1)
Basophils Relative: 1 %
Eosinophils Absolute: 0.3 10*3/uL (ref 0.0–0.5)
Eosinophils Relative: 4 %
HCT: 48.8 % (ref 39.0–52.0)
Hemoglobin: 15.4 g/dL (ref 13.0–17.0)
Immature Granulocytes: 0 %
Lymphocytes Relative: 19 %
Lymphs Abs: 1.2 10*3/uL (ref 0.7–4.0)
MCH: 28 pg (ref 26.0–34.0)
MCHC: 31.6 g/dL (ref 30.0–36.0)
MCV: 88.7 fL (ref 80.0–100.0)
Monocytes Absolute: 1 10*3/uL (ref 0.1–1.0)
Monocytes Relative: 15 %
Neutro Abs: 4.2 10*3/uL (ref 1.7–7.7)
Neutrophils Relative %: 61 %
Platelets: 189 10*3/uL (ref 150–400)
RBC: 5.5 MIL/uL (ref 4.22–5.81)
RDW: 14.6 % (ref 11.5–15.5)
WBC: 6.7 10*3/uL (ref 4.0–10.5)
nRBC: 0 % (ref 0.0–0.2)

## 2019-06-22 LAB — COMPREHENSIVE METABOLIC PANEL
ALT: 12 U/L (ref 0–44)
AST: 15 U/L (ref 15–41)
Albumin: 3.3 g/dL — ABNORMAL LOW (ref 3.5–5.0)
Alkaline Phosphatase: 68 U/L (ref 38–126)
Anion gap: 9 (ref 5–15)
BUN: 19 mg/dL (ref 8–23)
CO2: 21 mmol/L — ABNORMAL LOW (ref 22–32)
Calcium: 8.6 mg/dL — ABNORMAL LOW (ref 8.9–10.3)
Chloride: 105 mmol/L (ref 98–111)
Creatinine, Ser: 1.06 mg/dL (ref 0.61–1.24)
GFR calc Af Amer: >60 mL/min (ref >60)
GFR calc non Af Amer: >60 mL/min (ref >60)
Glucose, Bld: 79 mg/dL (ref 70–99)
Potassium: 4 mmol/L (ref 3.5–5.1)
Sodium: 135 mmol/L (ref 135–145)
Total Bilirubin: 0.9 mg/dL (ref 0.3–1.2)
Total Protein: 5.8 g/dL — ABNORMAL LOW (ref 6.5–8.1)

## 2019-06-22 LAB — URINALYSIS, ROUTINE W REFLEX MICROSCOPIC
Glucose, UA: NEGATIVE mg/dL
Hgb urine dipstick: NEGATIVE
Ketones, ur: 40 mg/dL — AB
Leukocytes,Ua: NEGATIVE
Nitrite: NEGATIVE
Protein, ur: NEGATIVE mg/dL
Specific Gravity, Urine: 1.02 (ref 1.005–1.030)
pH: 5.5 (ref 5.0–8.0)

## 2019-06-22 MED ORDER — ONDANSETRON HCL 4 MG/2ML IJ SOLN
4.0000 mg | Freq: Once | INTRAMUSCULAR | Status: AC
  Administered 2019-06-22: 4 mg via INTRAVENOUS
  Filled 2019-06-22: qty 2

## 2019-06-22 MED ORDER — IOHEXOL 300 MG/ML  SOLN
100.0000 mL | Freq: Once | INTRAMUSCULAR | Status: AC | PRN
  Administered 2019-06-22: 100 mL via INTRAVENOUS

## 2019-06-22 MED ORDER — OXYCODONE-ACETAMINOPHEN 5-325 MG PO TABS
1.0000 | ORAL_TABLET | Freq: Once | ORAL | Status: AC
  Administered 2019-06-22: 1 via ORAL
  Filled 2019-06-22: qty 1

## 2019-06-22 NOTE — Discharge Instructions (Addendum)
Your labs show that you are mildly dehydrated.  Make sure you are increasing your fluid intake.  Your calcium is slightly low.  Have this rechecked with your primary care doctor.  Your imaging shows concern for a large left hydrocele that will likely need surgical intervention to remove.  There is no signs of infection at this time.  Your prostate is mildly enlarged.  You also have some stones in your gallbladder but no signs of infection of your gallbladder.  I would recommend following up with your primary care doctor.  Need to have your urine rechecked as it did have some bilirubin in it.  You can return to the ER with any worsening's testicular pain, difficulties urinating, fevers or chills or for any other reason.  Motrin and Tylenol for pain and swelling.  Use scrotal support to help with the pain he can also apply ice for swelling.  Please call your primary care doctor today or tomorrow to have a immediate follow-up.  You also need to call your urologist to see they get you an appointment sooner.

## 2019-06-22 NOTE — ED Notes (Signed)
States he has bilateral testicle swelling, states onset began back in July, states he has been to this ED twice. Returns today with cont c/o testicular pain. States yesterday left scrotum was normal size and within a few hours became very enlarged. Has pain intermittently

## 2019-06-22 NOTE — ED Triage Notes (Signed)
Groin swelling on and off since July.

## 2019-06-23 DIAGNOSIS — J449 Chronic obstructive pulmonary disease, unspecified: Secondary | ICD-10-CM | POA: Diagnosis not present

## 2019-06-23 DIAGNOSIS — I509 Heart failure, unspecified: Secondary | ICD-10-CM | POA: Diagnosis not present

## 2019-06-23 DIAGNOSIS — I10 Essential (primary) hypertension: Secondary | ICD-10-CM | POA: Diagnosis not present

## 2019-06-25 NOTE — ED Provider Notes (Signed)
[facility] EMERGENCY DEPARTMENT Provider Note   CSN: [id number] Arrival date & time: 06/22/19  1106     History Chief Complaint  Patient presents with  . Groin Swelling    Mitchell [name] is a 79 y.o. male.  HPI 79 year old Caucasian male past medical history documented below presents to the emergency department today for evaluation of scrotal swelling.  Patient reports she is had on and off swelling to his scrotum for the past 6 months.  Patient has been seen 2 times in the ER for same.  He was diagnosed with hydrocele and has follow-up with urology next month.  Patient reports that he noticed the swelling yesterday.  Reports some pain to the left testicle.  Denies any difficulty with urination.  Denies any abdominal pain, nausea or vomiting.  No fevers or chills.  He is taken no medications for pain prior to arrival.  No alleviating or aggravating factors.  Patient is concerned that he has cancer.  Patient states that he feels like he had some leakage from his scrotum 2 days ago.    Past Medical History:  Diagnosis Date  . Anginal pain (HCC) 01/2012  . Anxiety   . Atherosclerotic heart disease of native coronary artery without angina pectoris   . BPH (benign prostatic hypertrophy)   . CAD (coronary artery disease) 2007   with LAD/Diag CBPTCA and kissing   . CKD (chronic kidney disease), stage III    Hattie Perch 01/04/2017  . COPD (chronic obstructive pulmonary disease) (HCC)    "maybe" (02/14/2012)  . Coronary artery disease   . Depression   . GERD (gastroesophageal reflux disease)   . Hyperlipidemia   . Hypertension   . Hypoxemia   . Myocardial infarction (HCC) 01/22/2012   NSTEMI with DES to RSA (PDA)  . On home oxygen therapy   . Shortness of breath    "walking and laying down sometimes" (02/14/2012)    Patient Active Problem List   Diagnosis Date Noted  . Effusion, right knee 01/10/2017  . Osteoarthritis of right knee 01/10/2017  . Gout of right knee  01/10/2017  . Dehydration   . Atypical atrial flutter (HCC)   . Dizziness 01/04/2017  . Right shoulder pain 01/04/2017  . Hypokalemia 01/04/2017  . Influenza 05/10/2016  . CKD (chronic kidney disease), stage III 05/10/2016  . Acute respiratory failure with hypoxia (HCC) 05/02/2016  . AKI (acute kidney injury) (HCC) 05/02/2016  . Gout flare 05/02/2016  . Chronic respiratory failure (HCC) 03/15/2015  . Vision impairment 01/31/2015  . Right foot pain 01/31/2015  . Obesity 01/31/2015  . Alcohol use 01/31/2015  . COPD (chronic obstructive pulmonary disease) (HCC) 04/30/2013  . Dyspnea 02/14/2012  . Hyperlipemia   . History of depression   . Chest pain 01/23/2012  . Coronary artery disease involving native coronary artery of native heart with angina pectoris (HCC) 01/23/2012  . Cholelithiasis   . Essential hypertension 04/29/2011    Past Surgical History:  Procedure Laterality Date  . CATARACT EXTRACTION W/ INTRAOCULAR LENS  IMPLANT, BILATERAL  ~ 2010  . CORONARY ANGIOPLASTY WITH STENT PLACEMENT  05/11/2005   PCI OF LAD  . GANGLION CYST REMOVED  1956?   right  . LEFT HEART CATHETERIZATION WITH CORONARY ANGIOGRAM Bilateral 01/24/2012   Procedure: LEFT HEART CATHETERIZATION WITH CORONARY ANGIOGRAM;  Surgeon: Lesleigh Noe, MD;  Location: [facility] CATH LAB;  Service: Cardiovascular;  Laterality: Bilateral;  . REFRACTIVE SURGERY  2010   left  Family History  Problem Relation Age of Onset  . Heart failure Mother   . Heart failure Brother     Social History   Tobacco Use  . Smoking status: Former Smoker    Years: 42.00    Types: Pipe    Quit date: 04/28/2011    Years since quitting: 8.1  . Smokeless tobacco: Never Used  Substance Use Topics  . Alcohol use: Yes    Alcohol/week: 7.0 standard drinks    Types: 7 Standard drinks or equivalent per week    Comment: 02/14/2012 "weekends I have 6-7 mixed drinks"     occasional  . Drug use: No    Home Medications Prior to  Admission medications   Medication Sig Start Date End Date Taking? Authorizing Provider  acetaminophen (ACETAMINOPHEN EXTRA STRENGTH) 500 MG tablet Take 500 mg by mouth every 6 (six) hours as needed.    [provider]  albuterol (PROVENTIL HFA;VENTOLIN HFA) 108 (90 BASE) MCG/ACT inhaler Inhale 2 puffs into the lungs every 6 (six) hours as needed for wheezing. Reported on 05/04/2015    [provider]  aspirin EC 81 MG tablet Take 1 tablet (81 mg total) by mouth daily. 12/03/16   Lyn Records, MD  BISACODYL PO Take 1 tablet by mouth daily as needed.    [provider]  diclofenac (VOLTAREN) 75 MG EC tablet 75 mg. Take 2 tablets at the onset of gout then 1 tablet twice daily as needed.    [provider]  ELIQUIS 5 MG TABS tablet Take 1 tablet by mouth twice daily 06/20/18   Lyn Records, MD  finasteride (PROSCAR) 5 MG tablet Take 5 mg by mouth daily. Reported on 05/04/2015    [provider]  furosemide (LASIX) 40 MG tablet Take 40 mg by mouth daily.  05/29/18   [provider]  HYDROcodone-acetaminophen (NORCO) 5-325 MG tablet Take 1 tablet by mouth every 8 (eight) hours as needed for severe pain. 02/13/19   Marshia Ly, PA-C  metoprolol tartrate (LOPRESSOR) 25 MG tablet TAKE 1 TABLET TWICE DAILY 01/02/19   Leone Brand, NP  mometasone-formoterol (DULERA) 100-5 MCG/ACT AERO Inhale 2 puffs into the lungs 2 (two) times daily. 02/08/15   Oretha Milch, MD  Multiple Vitamins-Minerals (EYE VITAMINS & MINERALS PO) Take 1 tablet by mouth daily.    [provider]  nitroGLYCERIN (NITROSTAT) 0.4 MG SL tablet Place 1 tablet (0.4 mg total) under the tongue every 5 (five) minutes as needed. Reported on 05/04/2015 08/20/17   Leone Brand, NP  potassium chloride (KLOR-CON 10) 10 MEQ tablet Take 1 tablet (10 mEq total) by mouth daily. 12/25/17   Lyn Records, MD  pravastatin (PRAVACHOL) 20 MG tablet TAKE 1/2 TABLET EVERY EVENING 03/11/19   Lyn Records, MD  tamsulosin [facility]) 0.4 MG CAPS capsule Take 0.4 mg by mouth daily after breakfast. Reported on 05/04/2015 03/19/13   [provider]    Allergies    Lipitor [atorvastatin], Statins, and Rosuvastatin  Review of Systems   Review of Systems  Constitutional: Negative for chills and fever.  HENT: Negative for congestion.   Eyes: Negative for discharge.  Respiratory: Negative for cough and shortness of breath.   Cardiovascular: Negative for chest pain.  Gastrointestinal: Negative for abdominal pain, diarrhea, nausea and vomiting.  Genitourinary: Positive for scrotal swelling and testicular pain. Negative for dysuria, flank pain, frequency and hematuria.  Musculoskeletal: Negative for myalgias.  Skin: Negative for color change.  Neurological: Negative for headaches.  Psychiatric/Behavioral: Negative for confusion.    Physical Exam Updated Vital Signs BP (!) 159/101 (BP Location: Right Arm)   Pulse 69   Temp 97.7 F (36.5 C) (Oral)   Resp 18   Ht 6' (1.829 m)   Wt 90.3 kg   SpO2 96%   BMI 26.99 kg/m   Physical Exam Vitals and nursing note reviewed.  Constitutional:      General: He is not in acute distress.    Appearance: He is well-developed. He is not ill-appearing or toxic-appearing.  HENT:     Head: Normocephalic and atraumatic.     Mouth/Throat:     Mouth: Mucous membranes are moist.  Eyes:     General:        Right eye: No discharge.        Left eye: No discharge.     Conjunctiva/sclera: Conjunctivae normal.     Pupils: Pupils are equal, round, and reactive to light.  Cardiovascular:     Rate and Rhythm: Normal rate and regular rhythm.     Heart sounds: Normal heart sounds. No murmur. No friction rub. No gallop.   Pulmonary:     Effort: Pulmonary effort is normal. No respiratory distress.     Breath sounds: Normal breath sounds.  Chest:     Chest wall: No tenderness.  Abdominal:     General: Bowel sounds are normal. There is no distension.      Palpations: Abdomen is soft.     Tenderness: There is no abdominal tenderness. There is no right CVA tenderness, left CVA tenderness, guarding or rebound.  Genitourinary:    Comments: Patient has significantly swollen scrotum with pain to palpation of the left testicle.  There is no crepitus, erythema appreciated.  No swelling of the penis.  There is no discharge appreciated.  No palpable hernia.  No inguinal lymphadenopathy. Musculoskeletal:        General: No tenderness. Normal range of motion.     Cervical back: Normal range of motion and neck supple.  Lymphadenopathy:     Cervical: No cervical adenopathy.  Skin:    General: Skin is warm and dry.     Capillary Refill: Capillary refill takes less than 2 seconds.     Findings: No rash.  Neurological:     Mental Status: He is alert and oriented to person, place, and time.  Psychiatric:        Behavior: Behavior normal.        Thought Content: Thought content normal.        Judgment: Judgment normal.     ED Results / Procedures / Treatments   Labs (all labs ordered are listed, but only abnormal results are displayed) Labs Reviewed  COMPREHENSIVE METABOLIC PANEL - Abnormal; Notable for the following components:      Result Value   CO2 21 (*)    Calcium 8.6 (*)    Total Protein 5.8 (*)    Albumin 3.3 (*)    All other components within normal limits  URINALYSIS, ROUTINE W REFLEX MICROSCOPIC - Abnormal; Notable for the following components:   Bilirubin Urine MODERATE (*)    Ketones, ur 40 (*)    All other components within normal limits  CBC WITH DIFFERENTIAL/PLATELET    EKG None  Radiology No results found.  Procedures Procedures (including critical care time)  Medications Ordered in ED Medications  iohexol (OMNIPAQUE) 300 MG/ML solution 100 mL (100 mLs Intravenous Contrast Given 06/22/19 1421)  oxyCODONE-acetaminophen (PERCOCET/ROXICET) 5-325 MG per tablet 1 tablet (1 tablet Oral Given 06/22/19 1447)  ondansetron  (ZOFRAN) injection 4 mg (4 mg Intravenous Given 06/22/19 1447)    ED Course  I have reviewed the triage vital signs and the nursing notes.  Pertinent labs & imaging results that were available during my care of the patient were reviewed by me and considered in my medical decision making (see chart for details).    MDM Rules/Calculators/A&P                      79 year old male presents the ER for ongoing scrotal swelling.  Has history of large left hydrocele.  Has follow-up with urology next month.  Reports left scrotal pain and swelling.  Denies any other associated symptoms.  Vital signs are reassuring.  No focal abdominal pain to palpation.  Labs reassuring.  No leukocytosis.  No significant electrolyte derangement.  Mild hypocalcemia of 8.6.  UA shows moderate bilirubin but no other signs of infection.  Ultrasound shows large left hydrocele that is unchanged from prior.  No signs of torsion.  CT scan confirms large left hydrocele without any other acute intra-abdominal pathology.  Incidental findings were noted and discussed with patient.  Case discussed with Dr. Lovena Neighbours with urology.  There is no emergent intervention at this time.  Will need urology follow-up in outpatient setting.  This was discussed with patient.  Encourage scrotal support and ice therapy at home.  Pain treated in the ER.  Pt is hemodynamically stable, in NAD, & able to ambulate in the ED. Evaluation does not show pathology that would require ongoing emergent intervention or inpatient treatment. I explained the diagnosis to the patient. Pain has been managed & has no complaints prior to dc. Pt is comfortable with above plan and is stable for discharge at this time. All questions were answered prior to disposition. Strict return precautions for f/u to the ED were discussed. Encouraged follow up with PCP.  Case dicussed with Dr. Sherry Ruffing who is agreeable with the above plan.  Final Clinical Impression(s) / ED Diagnoses Final  diagnoses:  Scrotal swelling    Rx / DC Orders ED Discharge Orders    None       Aaron Edelman 06/25/19 2109    Tegeler, Gwenyth Allegra, MD 06/26/19 [id number]

## 2019-06-29 DIAGNOSIS — M109 Gout, unspecified: Secondary | ICD-10-CM | POA: Diagnosis not present

## 2019-07-06 ENCOUNTER — Other Ambulatory Visit: Payer: Self-pay | Admitting: Pharmacist

## 2019-07-06 DIAGNOSIS — M109 Gout, unspecified: Secondary | ICD-10-CM | POA: Diagnosis not present

## 2019-07-06 NOTE — Patient Outreach (Signed)
[facility]) Care Management  07/06/2019  Mitchell [name] [birth year] [id number]  Patient's caregiver, (Ms. [name]), called to inquire about getting Eliquis through [employer] Patient Assistance Program.    Ms. [name] said they were going to handle the application on their own but had a few questions. She was reminded about including the Patient's financial documentation as well as his pharmacy spending as BMS requires patients to spend at least 3% of their household income on medications.  [name], PharmD, BCACP [facility] Clinical Pharmacist [phone]

## 2019-07-09 DIAGNOSIS — M109 Gout, unspecified: Secondary | ICD-10-CM | POA: Diagnosis not present

## 2019-07-22 DIAGNOSIS — M109 Gout, unspecified: Secondary | ICD-10-CM | POA: Diagnosis not present

## 2019-07-23 DIAGNOSIS — R1909 Other intra-abdominal and pelvic swelling, mass and lump: Secondary | ICD-10-CM | POA: Diagnosis not present

## 2019-07-24 DIAGNOSIS — I10 Essential (primary) hypertension: Secondary | ICD-10-CM | POA: Diagnosis not present

## 2019-07-24 DIAGNOSIS — J449 Chronic obstructive pulmonary disease, unspecified: Secondary | ICD-10-CM | POA: Diagnosis not present

## 2019-07-24 DIAGNOSIS — I509 Heart failure, unspecified: Secondary | ICD-10-CM | POA: Diagnosis not present

## 2019-07-31 ENCOUNTER — Telehealth: Payer: Self-pay | Admitting: Interventional Cardiology

## 2019-07-31 NOTE — Telephone Encounter (Signed)
   [facility] Pre-operative Risk Assessment    Request for surgical clearance:  1. What type of surgery is being performed? Drainage of hydrocele  2. When is this surgery scheduled? 08/12/2019   3. What type of clearance is required (medical clearance vs. Pharmacy clearance to hold med vs. Both)? pharmacy  4. Are there any medications that need to be held prior to surgery and how long? Would like recommendation of whether eliquis needs to be held and for how long  5. Practice name and name of physician performing surgery? [facility], Dr. Dorothyann Peng  6. What is your office phone number: [phone]   7.   What is your office fax number: [phone]  8.   Anesthesia type (None, local, MAC, general) ? local   Mitchell [name] 07/31/2019, 1:30 PM  _________________________________________________________________   (provider comments below)

## 2019-08-03 NOTE — Telephone Encounter (Signed)
Patient with diagnosis of a flutter on Eliquis for anticoagulation.    Procedure: Drainage of hydrocele Date of procedure: 08/12/2019  CHADS2-VASc score of  5 (CHF, HTN, AGE, CAD, AGE)  CrCl 56 ml/min  No need to hold Eliquis for this procedure.

## 2019-08-05 ENCOUNTER — Telehealth: Payer: Self-pay | Admitting: Interventional Cardiology

## 2019-08-05 NOTE — Telephone Encounter (Signed)
Follow Up:    Ladona Ridgel from Dr Rackley's office called. She wants to know the status of the clearance . She need this faxed asap to [phone].

## 2019-08-05 NOTE — Telephone Encounter (Signed)
   Primary Cardiologist: Lesleigh Noe, MD  Chart reviewed as part of pre-operative protocol coverage. Given past medical history and time since last visit, based on ACC/AHA guidelines, CLABORN JANUSZ would be at acceptable risk for the planned procedure without further cardiovascular testing.   Patient with diagnosis of a flutter on Eliquis for anticoagulation.    Procedure: Drainage of hydrocele Date of procedure: 08/12/2019  CHADS2-VASc score of  5 (CHF, HTN, AGE, CAD, AGE)  CrCl 56 ml/min  No need to hold Eliquis for this procedure  I will route this recommendation to the requesting party via Epic fax function and remove from pre-op pool.  Please call with questions.  Thomasene Ripple. Yamila Cragin NP-C      [facility] [address] Office [phone] Fax [phone]

## 2019-08-12 DIAGNOSIS — R1909 Other intra-abdominal and pelvic swelling, mass and lump: Secondary | ICD-10-CM | POA: Diagnosis not present

## 2019-08-12 DIAGNOSIS — N433 Hydrocele, unspecified: Secondary | ICD-10-CM | POA: Diagnosis not present

## 2019-08-23 DIAGNOSIS — I509 Heart failure, unspecified: Secondary | ICD-10-CM | POA: Diagnosis not present

## 2019-08-23 DIAGNOSIS — J449 Chronic obstructive pulmonary disease, unspecified: Secondary | ICD-10-CM | POA: Diagnosis not present

## 2019-08-23 DIAGNOSIS — I10 Essential (primary) hypertension: Secondary | ICD-10-CM | POA: Diagnosis not present

## 2019-08-24 ENCOUNTER — Telehealth: Payer: Self-pay | Admitting: Interventional Cardiology

## 2019-08-24 NOTE — Telephone Encounter (Signed)
° ° °  Patient calling the office for samples of medication: ° ° °1.  What medication and dosage are you requesting samples for? °ELIQUIS 5 MG TABS tablet ° °2.  Are you currently out of this medication? yes ° ° °

## 2019-08-28 NOTE — Telephone Encounter (Signed)
Patient is calling to follow up in regards to his request for Eliquis samples. He states he has not yet heard back from our office in regards to this inquiry. Please call.

## 2019-09-08 ENCOUNTER — Telehealth: Payer: Self-pay

## 2019-09-08 NOTE — Telephone Encounter (Signed)
**Note De-Identified  Obfuscation** The pt is aware that I have completed the provider page of his [employer]MS pt asst application for Eliquis and emailed it to Dr Lonn Georgia nurse so she can obtain Dr Lonn Georgia signature, date it and fax it to [employer] Pt Asst program at number written on cover letter included.   I did make him aware that in most cases [employer]MS will require his 2020 proof of income and his 2021 out of pocket expense report from his pharmacy. He requesting that we fax his application to [employer]MS as is.  I did give the pt BMS pt ast program phone number so he can call daily to check the progress of his application. He thanked me for calling him to discuss.

## 2019-09-09 NOTE — Telephone Encounter (Signed)
Paperwork signed this morning and faxed.

## 2019-09-15 NOTE — Telephone Encounter (Signed)
Follow up   Patient would like a call to discuss getting Eliquis. Please call.

## 2019-09-16 DIAGNOSIS — R0989 Other specified symptoms and signs involving the circulatory and respiratory systems: Secondary | ICD-10-CM | POA: Diagnosis not present

## 2019-09-16 DIAGNOSIS — J449 Chronic obstructive pulmonary disease, unspecified: Secondary | ICD-10-CM | POA: Diagnosis not present

## 2019-09-16 NOTE — Telephone Encounter (Signed)
**Note De-Identified  Obfuscation** I called the pt but got no answer and I could not leave a message as the pts voice mailbox message states that it is full and cannot accept messages at this time. I will continue to call.

## 2019-09-18 ENCOUNTER — Telehealth: Payer: Self-pay | Admitting: Interventional Cardiology

## 2019-09-18 NOTE — Telephone Encounter (Signed)
    Went to chart to check notes, transferred call to [facility]

## 2019-09-18 NOTE — Telephone Encounter (Signed)
Patient called again, stated he is out of Eliquis.

## 2019-09-18 NOTE — Telephone Encounter (Signed)
**Note De-Identified  Obfuscation** The pt states that he has been calling BMS at [phone] but keeps receiving the same message that that phone number is not in service. I advised that he the number he is calling is 1 digit off from [employer] Pt Asst program which is at [phone]. He wrote the number down and repeated it back to me correctly X 2.  He is advised that we will leave him 1 bottle of Eliquis 5 mg samples at our covid screening table in the downstairs lobby of Dr Katrinka Blazing office in [place] for him to pick up.  He states that he will call BMS pt asst as soon as we end this call and thanked me for our help.

## 2019-09-18 NOTE — Telephone Encounter (Signed)
**Note De-Identified  Obfuscation** See 09/08/19 phone notes for update.

## 2019-09-21 NOTE — Telephone Encounter (Signed)
**Note De-Identified  Obfuscation** Letter received from [employer] Pt Asst [employer] stating that they denied the pt asst with his Eliquis. Reason: Medicare, insufficient TrOOP/OOP Application Case#:  [id number]  The letter states that they have notified the pt of this denial as well.

## 2019-09-22 ENCOUNTER — Other Ambulatory Visit: Payer: Self-pay | Admitting: Interventional Cardiology

## 2019-09-22 MED ORDER — APIXABAN 5 MG PO TABS: 5.0000 mg | ORAL_TABLET | Freq: Two times a day (BID) | ORAL | 1 refills | Status: DC

## 2019-09-22 NOTE — Telephone Encounter (Signed)
Eliquis 5mg  refill request received. Patient is 79 years old, weight-90.3kg, Crea-1.06 on 06/22/2019, Diagnosis-Aflutter, and last seen by Dr. 08/22/2019 on 01/27/2019. Dose is appropriate based on dosing criteria. Will send in refill to requested pharmacy.

## 2019-09-22 NOTE — Telephone Encounter (Signed)
*  STAT* If patient is at the pharmacy, call can be transferred to refill team.   1. Which medications need to be refilled? (please list name of each medication and dose if known)  ELIQUIS 5 MG TABS tablet  2. Which pharmacy/location (including street and city if local pharmacy) is medication to be sent to?  [id number] - [place], [place] - [address]  3. Do they need a 30 day or 90 day supply? 12  The wife called and said that [facility] [place]. The old rx from [facility] has expired and the patient will need a new rx sent to [facility]

## 2019-09-23 DIAGNOSIS — J449 Chronic obstructive pulmonary disease, unspecified: Secondary | ICD-10-CM | POA: Diagnosis not present

## 2019-09-23 DIAGNOSIS — I509 Heart failure, unspecified: Secondary | ICD-10-CM | POA: Diagnosis not present

## 2019-09-23 DIAGNOSIS — I10 Essential (primary) hypertension: Secondary | ICD-10-CM | POA: Diagnosis not present

## 2019-09-25 NOTE — Telephone Encounter (Signed)
**Note De-Identified Deion Swift Obfuscation** Letter received [place] fax from BMS pt asst foundation stating that they approved the pt for asst with his Eliquis. Approval is good until 04/15/2020. Application Case#: [id number]  The letter states that they have notified the pt of this approval as well.

## 2019-10-23 DIAGNOSIS — I10 Essential (primary) hypertension: Secondary | ICD-10-CM | POA: Diagnosis not present

## 2019-10-23 DIAGNOSIS — I509 Heart failure, unspecified: Secondary | ICD-10-CM | POA: Diagnosis not present

## 2019-10-23 DIAGNOSIS — J449 Chronic obstructive pulmonary disease, unspecified: Secondary | ICD-10-CM | POA: Diagnosis not present

## 2019-11-23 DIAGNOSIS — J449 Chronic obstructive pulmonary disease, unspecified: Secondary | ICD-10-CM | POA: Diagnosis not present

## 2019-11-23 DIAGNOSIS — I509 Heart failure, unspecified: Secondary | ICD-10-CM | POA: Diagnosis not present

## 2019-11-23 DIAGNOSIS — I10 Essential (primary) hypertension: Secondary | ICD-10-CM | POA: Diagnosis not present

## 2019-12-08 DIAGNOSIS — L03119 Cellulitis of unspecified part of limb: Secondary | ICD-10-CM | POA: Diagnosis not present

## 2019-12-08 DIAGNOSIS — M542 Cervicalgia: Secondary | ICD-10-CM | POA: Diagnosis not present

## 2019-12-14 DIAGNOSIS — Z961 Presence of intraocular lens: Secondary | ICD-10-CM | POA: Diagnosis not present

## 2019-12-14 DIAGNOSIS — H353134 Nonexudative age-related macular degeneration, bilateral, advanced atrophic with subfoveal involvement: Secondary | ICD-10-CM | POA: Diagnosis not present

## 2019-12-17 DIAGNOSIS — L299 Pruritus, unspecified: Secondary | ICD-10-CM | POA: Diagnosis not present

## 2019-12-17 DIAGNOSIS — Z23 Encounter for immunization: Secondary | ICD-10-CM | POA: Diagnosis not present

## 2019-12-17 DIAGNOSIS — L03115 Cellulitis of right lower limb: Secondary | ICD-10-CM | POA: Diagnosis not present

## 2019-12-24 DIAGNOSIS — J449 Chronic obstructive pulmonary disease, unspecified: Secondary | ICD-10-CM | POA: Diagnosis not present

## 2019-12-24 DIAGNOSIS — I509 Heart failure, unspecified: Secondary | ICD-10-CM | POA: Diagnosis not present

## 2019-12-24 DIAGNOSIS — I10 Essential (primary) hypertension: Secondary | ICD-10-CM | POA: Diagnosis not present

## 2020-01-23 DIAGNOSIS — I10 Essential (primary) hypertension: Secondary | ICD-10-CM | POA: Diagnosis not present

## 2020-01-23 DIAGNOSIS — I509 Heart failure, unspecified: Secondary | ICD-10-CM | POA: Diagnosis not present

## 2020-01-23 DIAGNOSIS — J449 Chronic obstructive pulmonary disease, unspecified: Secondary | ICD-10-CM | POA: Diagnosis not present

## 2020-02-05 DIAGNOSIS — Z1389 Encounter for screening for other disorder: Secondary | ICD-10-CM | POA: Diagnosis not present

## 2020-02-05 DIAGNOSIS — Z0000 Encounter for general adult medical examination without abnormal findings: Secondary | ICD-10-CM | POA: Diagnosis not present

## 2020-02-23 DIAGNOSIS — I509 Heart failure, unspecified: Secondary | ICD-10-CM | POA: Diagnosis not present

## 2020-02-23 DIAGNOSIS — J449 Chronic obstructive pulmonary disease, unspecified: Secondary | ICD-10-CM | POA: Diagnosis not present

## 2020-02-23 DIAGNOSIS — I10 Essential (primary) hypertension: Secondary | ICD-10-CM | POA: Diagnosis not present

## 2020-03-15 ENCOUNTER — Other Ambulatory Visit: Payer: Self-pay | Admitting: Interventional Cardiology

## 2020-03-18 ENCOUNTER — Telehealth: Payer: Self-pay

## 2020-03-18 NOTE — Telephone Encounter (Signed)
**Note De-Identified  Obfuscation** The pt left his completed BMSPAF application at the office with his oop expense report. I have completed the provider page of the application and emailed it to Dr Lonn Georgia nurse so she can obtain his signature, date it and to fax all to [facility] at fax number written on cover letter.

## 2020-03-19 ENCOUNTER — Other Ambulatory Visit: Payer: Self-pay | Admitting: Cardiology

## 2020-03-21 NOTE — Telephone Encounter (Signed)
Paperwork completed and faxed.  °

## 2020-03-24 ENCOUNTER — Other Ambulatory Visit: Payer: Self-pay | Admitting: Interventional Cardiology

## 2020-03-24 DIAGNOSIS — I10 Essential (primary) hypertension: Secondary | ICD-10-CM | POA: Diagnosis not present

## 2020-03-24 DIAGNOSIS — I509 Heart failure, unspecified: Secondary | ICD-10-CM | POA: Diagnosis not present

## 2020-03-24 DIAGNOSIS — J449 Chronic obstructive pulmonary disease, unspecified: Secondary | ICD-10-CM | POA: Diagnosis not present

## 2020-03-24 NOTE — Telephone Encounter (Signed)
Age 79, weight 90kg, SCr 1.06 on 06/22/19 Afib indication Last OV Oct 2020, pt overdue for follow up

## 2020-04-06 ENCOUNTER — Other Ambulatory Visit: Payer: Self-pay | Admitting: Interventional Cardiology

## 2020-04-24 DIAGNOSIS — J449 Chronic obstructive pulmonary disease, unspecified: Secondary | ICD-10-CM | POA: Diagnosis not present

## 2020-04-24 DIAGNOSIS — I10 Essential (primary) hypertension: Secondary | ICD-10-CM | POA: Diagnosis not present

## 2020-04-24 DIAGNOSIS — I509 Heart failure, unspecified: Secondary | ICD-10-CM | POA: Diagnosis not present

## 2020-04-28 DIAGNOSIS — G4700 Insomnia, unspecified: Secondary | ICD-10-CM | POA: Diagnosis not present

## 2020-04-28 DIAGNOSIS — I10 Essential (primary) hypertension: Secondary | ICD-10-CM | POA: Diagnosis not present

## 2020-04-28 DIAGNOSIS — N400 Enlarged prostate without lower urinary tract symptoms: Secondary | ICD-10-CM | POA: Diagnosis not present

## 2020-04-28 DIAGNOSIS — M109 Gout, unspecified: Secondary | ICD-10-CM | POA: Diagnosis not present

## 2020-04-28 DIAGNOSIS — E7800 Pure hypercholesterolemia, unspecified: Secondary | ICD-10-CM | POA: Diagnosis not present

## 2020-04-28 DIAGNOSIS — D6869 Other thrombophilia: Secondary | ICD-10-CM | POA: Diagnosis not present

## 2020-04-28 DIAGNOSIS — N183 Chronic kidney disease, stage 3 unspecified: Secondary | ICD-10-CM | POA: Diagnosis not present

## 2020-04-28 DIAGNOSIS — J449 Chronic obstructive pulmonary disease, unspecified: Secondary | ICD-10-CM | POA: Diagnosis not present

## 2020-04-28 DIAGNOSIS — I5032 Chronic diastolic (congestive) heart failure: Secondary | ICD-10-CM | POA: Diagnosis not present

## 2020-05-04 ENCOUNTER — Other Ambulatory Visit: Payer: Self-pay | Admitting: Interventional Cardiology

## 2020-05-04 NOTE — Telephone Encounter (Signed)
Prescription refill request for Eliquis received. Indication: Aflutter Last office visit: 01/27/2019 Scr: 1.06, 06/22/2019 Age: 80 yo   Weight: 90.3 kg   Pt is overdue for an office visit, pt is scheduled to see Dr. Katrinka Blazing 07/18/2020. Will send in enough to get him to his appointment. Prescription refill sent.

## 2020-05-23 NOTE — Telephone Encounter (Signed)
**Note De-Identified  Obfuscation** Letter received from [facility] stating that they need documentation of 3% out of pocket RX expenses, based on household adjusted gross income, not met. The letter states that they have sent the pt this letter as well.that the pts have

## 2020-05-25 DIAGNOSIS — J449 Chronic obstructive pulmonary disease, unspecified: Secondary | ICD-10-CM | POA: Diagnosis not present

## 2020-05-25 DIAGNOSIS — I509 Heart failure, unspecified: Secondary | ICD-10-CM | POA: Diagnosis not present

## 2020-05-25 DIAGNOSIS — I10 Essential (primary) hypertension: Secondary | ICD-10-CM | POA: Diagnosis not present

## 2020-06-21 IMAGING — US US SCROTUM W/ DOPPLER COMPLETE
1 series · 14 of 25 positions shown · non-contrast
Comparison: Scrotal ultrasound 06/01/2019.

CLINICAL DATA: Scrotal swelling.

EXAM:
SCROTAL ULTRASOUND
DOPPLER ULTRASOUND OF THE TESTICLES
TECHNIQUE: Complete ultrasound examination of the testicles, epididymis, and
other scrotal structures was performed. Color and spectral Doppler
ultrasound were also utilized to evaluate blood flow to the
testicles.

[Series 1: us scrotum w/ doppler complete · 14 of 37 slices shown]
[im 1/37]
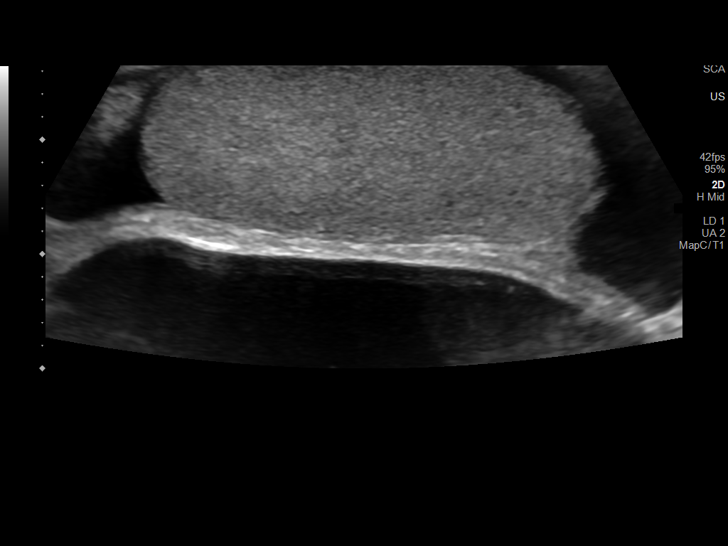
[im 4/37]
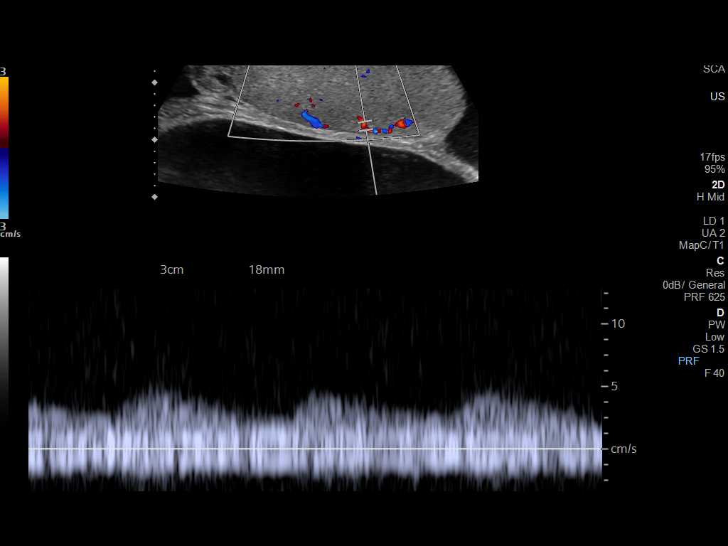
[im 7/37]
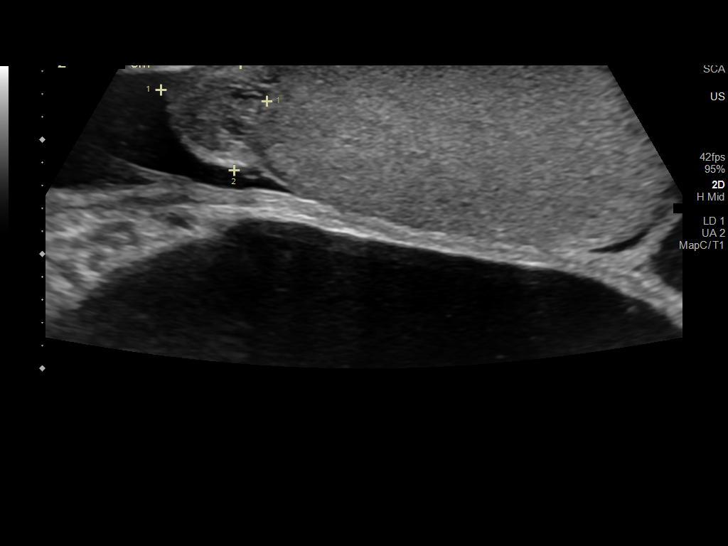
[im 10/37]
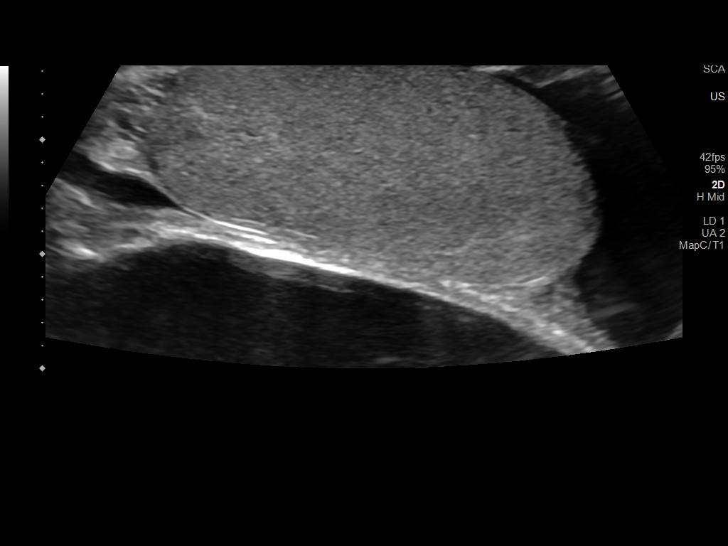
[im 13/37]
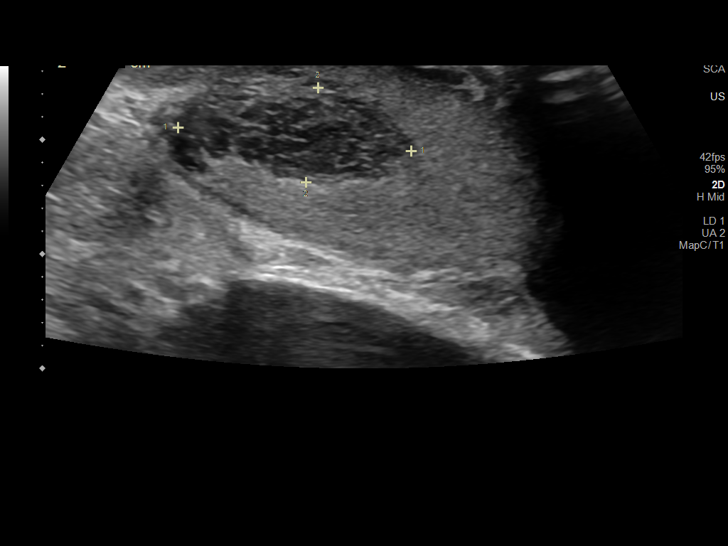
[im 14/37]
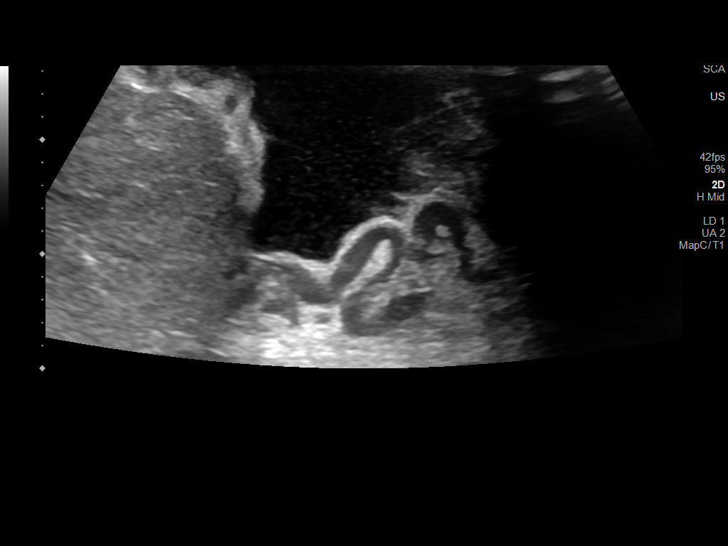
[im 17/37]
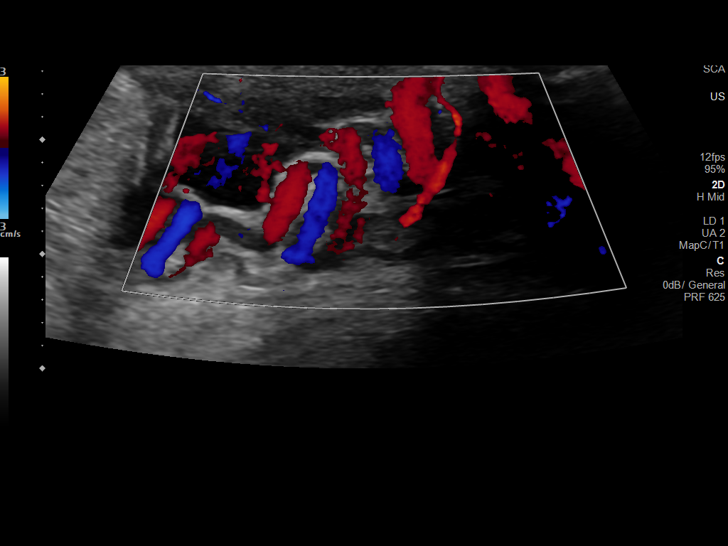
[im 20/37]
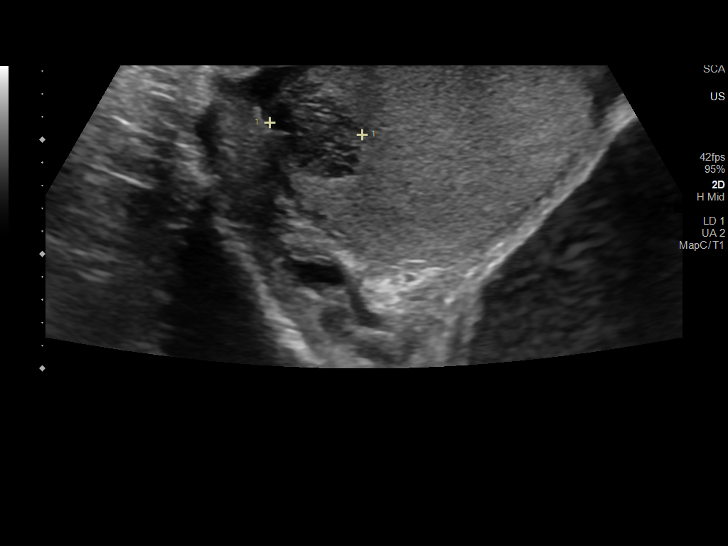
[im 23/37]
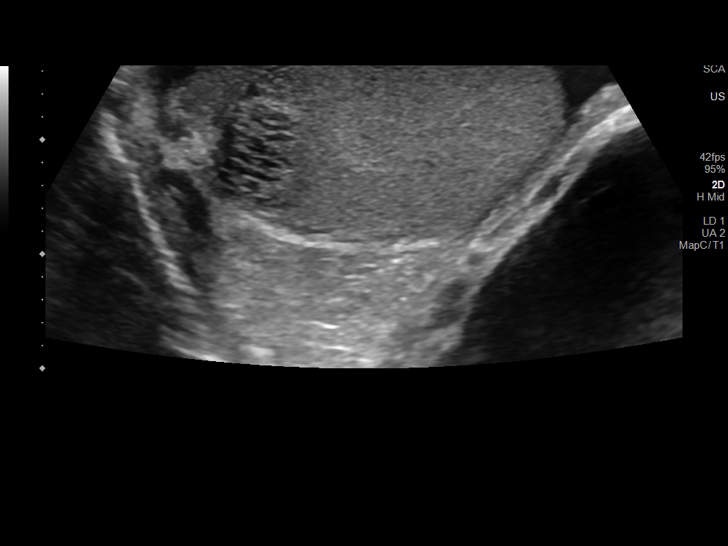
[im 25/37]
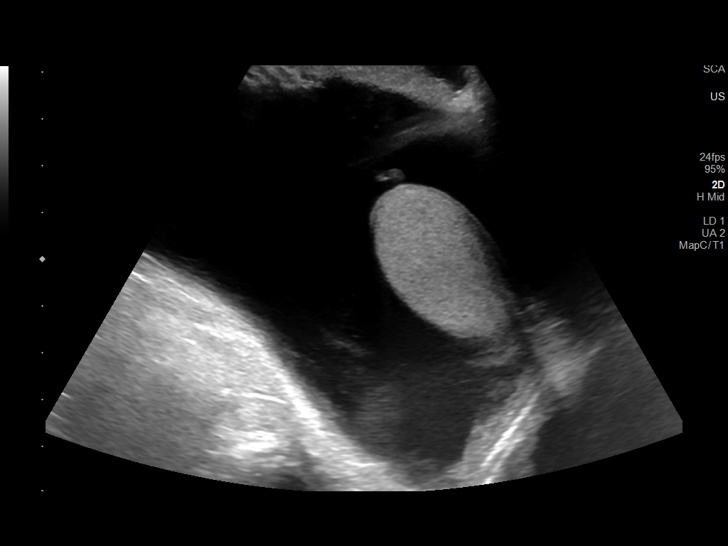
[im 28/37]
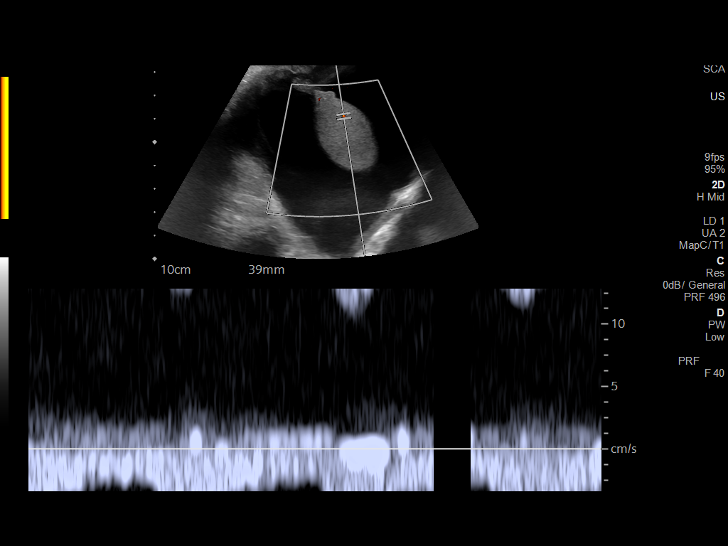
[im 31/37]
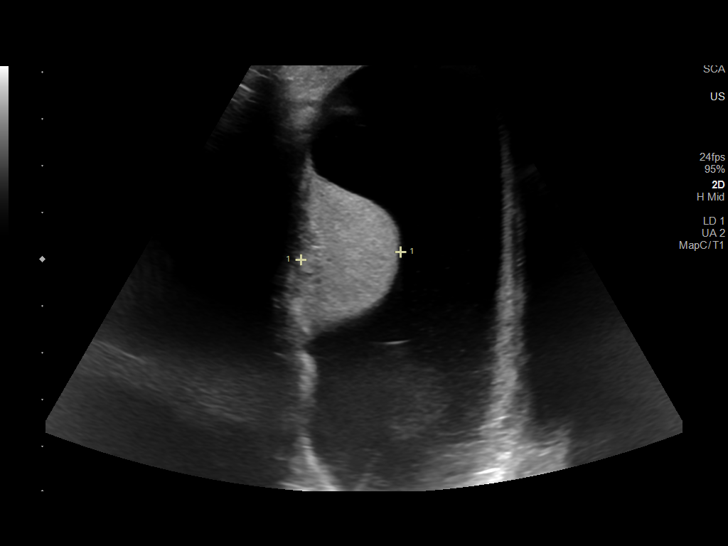
[im 34/37]
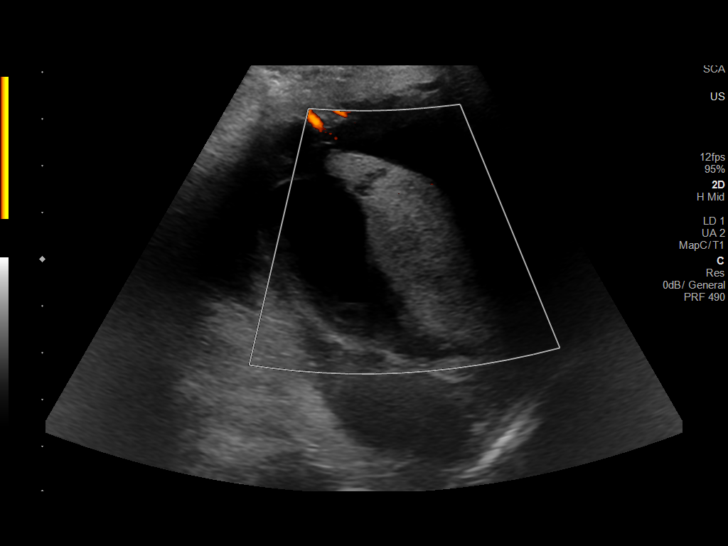
[im 37/37]
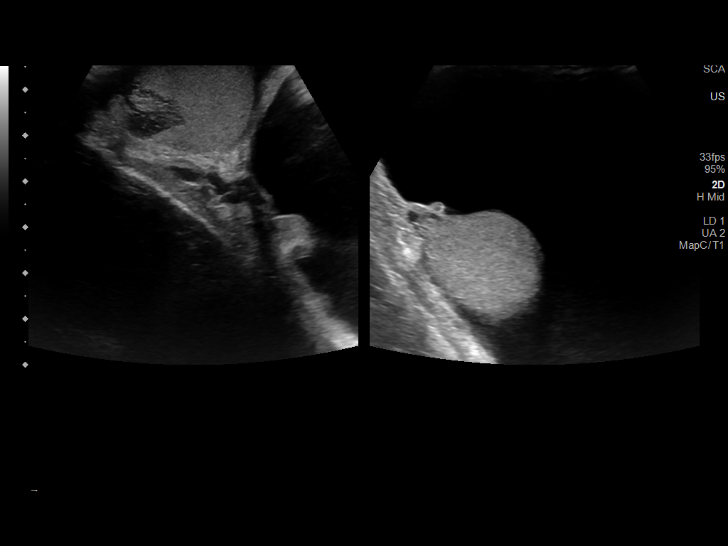

[14 of 25 positions shown; findings below may reference images not displayed]

FINDINGS: Right testicle

Measurements: 4.0 x 1.6 x 3.0 cm. No mass or microlithiasis
visualized. Rete testis incidentally noted.

Left testicle

Measurements: 4.0 x 2.4 x 2.1 cm. No mass or microlithiasis
visualized.

Right epididymis:  Normal in size and appearance.

Left epididymis:  Normal in size and appearance.

Hydrocele: Very large left and smaller right hydrocele are seen,
unchanged.

Varicocele: Positive on the right. No left varicocele is identified.

Pulsed Doppler interrogation of both testes demonstrates normal low
resistance arterial and venous waveforms bilaterally.
IMPRESSION: Large left and smaller right hydroceles are grossly unchanged.

Varicocele on the right.

Normal appearing testicles.

## 2020-06-21 IMAGING — CT CT ABD-PELV W/ CM
2 of 5 series · 15 of 46 positions shown, 17 images · IV contrast (Omnipaque)
Comparison: Scrotal ultrasound today and 06/01/2019. CT abdomen and
pelvis 12/26/2011.

CLINICAL DATA: Left scrotal pain and swelling since October 2018.

EXAM:
CT ABDOMEN AND PELVIS WITH CONTRAST
TECHNIQUE: Multidetector CT imaging of the abdomen and pelvis was performed
using the standard protocol following bolus administration of
intravenous contrast.
CONTRAST:  100 mL OMNIPAQUE IOHEXOL 300 MG/ML  SOLN

[Series 2: axial st · axial · 0.97mm/px · z∈[-686,-162]mm · 12 of 119 slices shown, 14 images]
[im 7/119  soft-tissue]
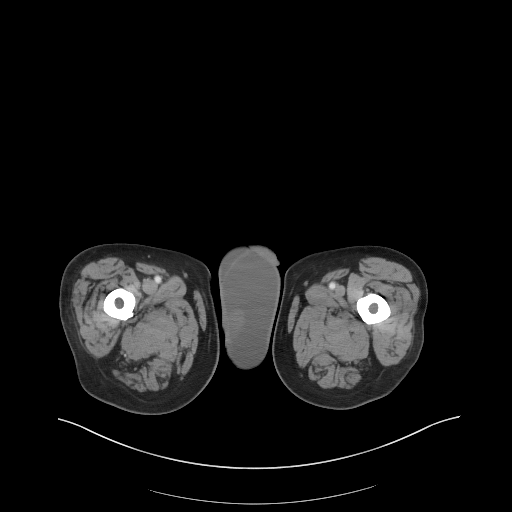
[im 7/119  bone]
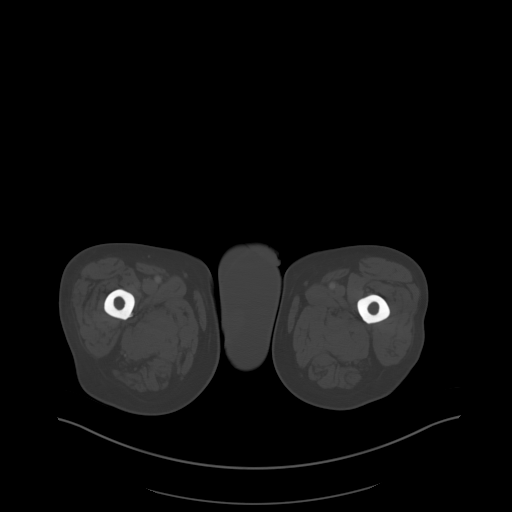
[im 20/119  soft-tissue]
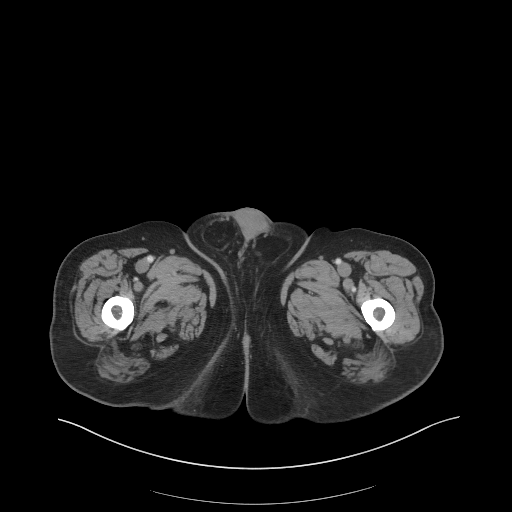
[im 27/119  soft-tissue]
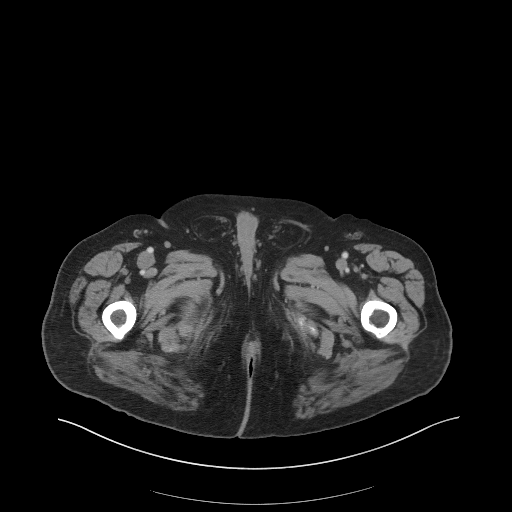
[im 33/119  soft-tissue]
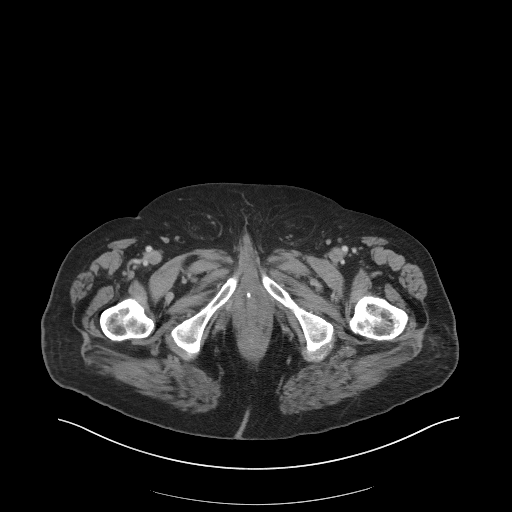
[im 46/119  soft-tissue]
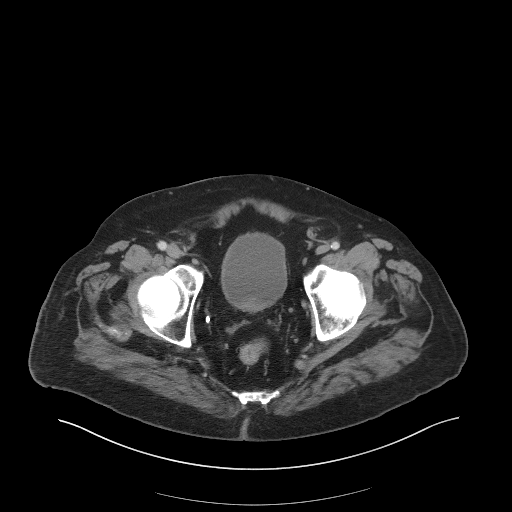
[im 53/119  soft-tissue]
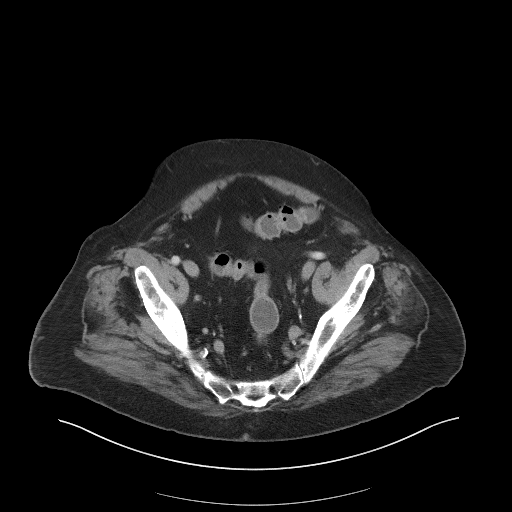
[im 66/119  soft-tissue]
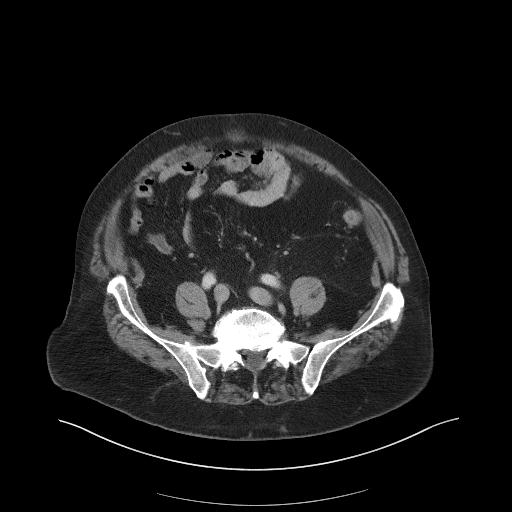
[im 73/119  soft-tissue]
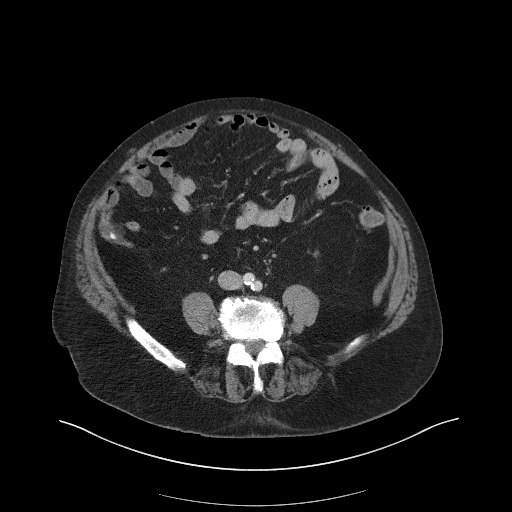
[im 86/119  soft-tissue]
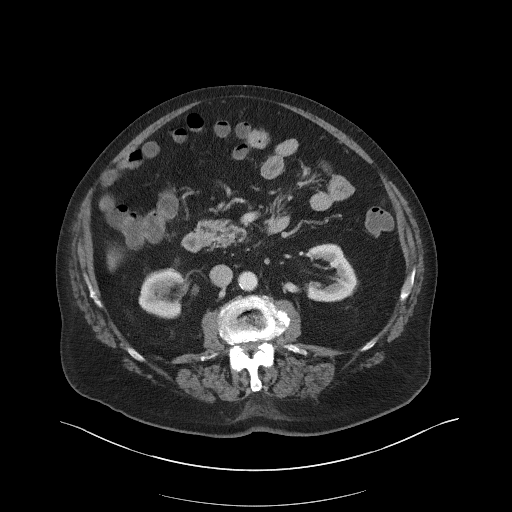
[im 86/119  bone]
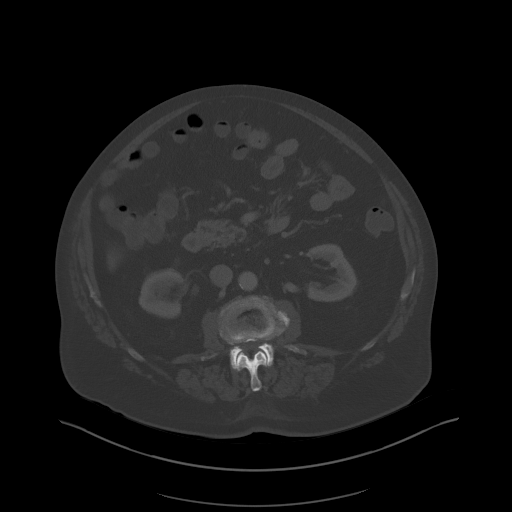
[im 92/119  soft-tissue]
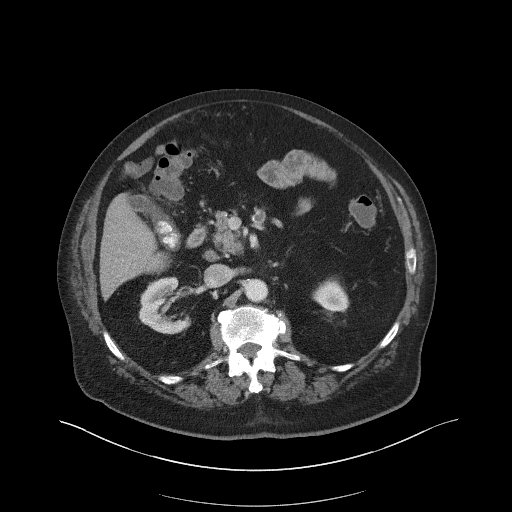
[im 99/119  soft-tissue]
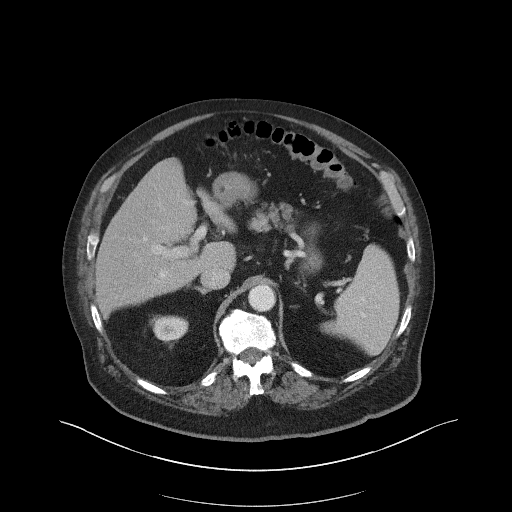
[im 112/119  soft-tissue]
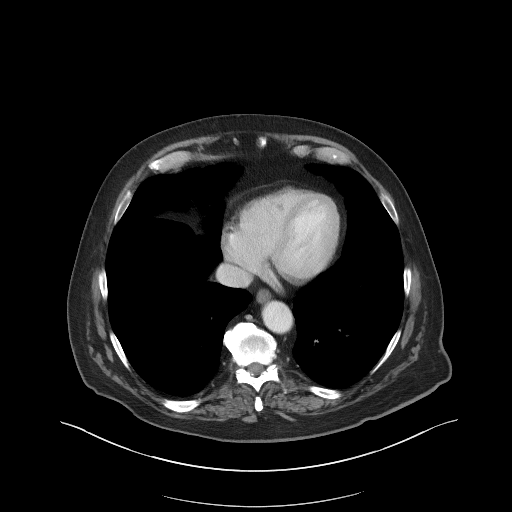

[Series 4: coronal st · coronal · 0.80mm/px · 3 of 124 slices shown]
[im 42/124  soft-tissue]
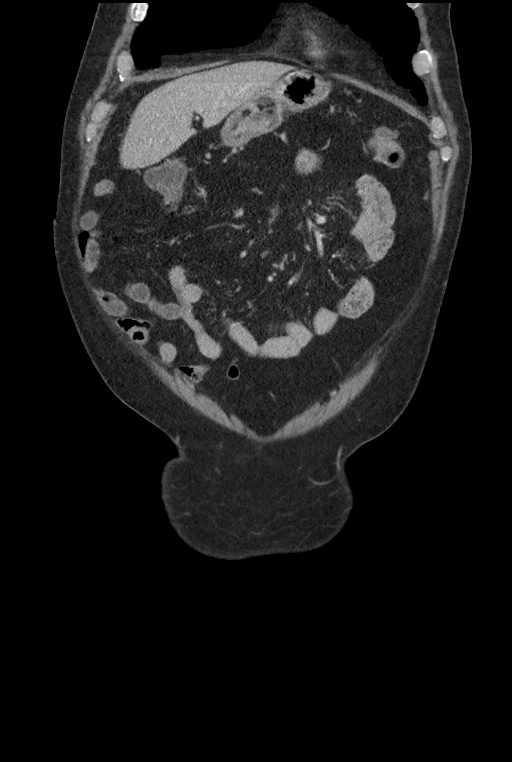
[im 55/124  soft-tissue]
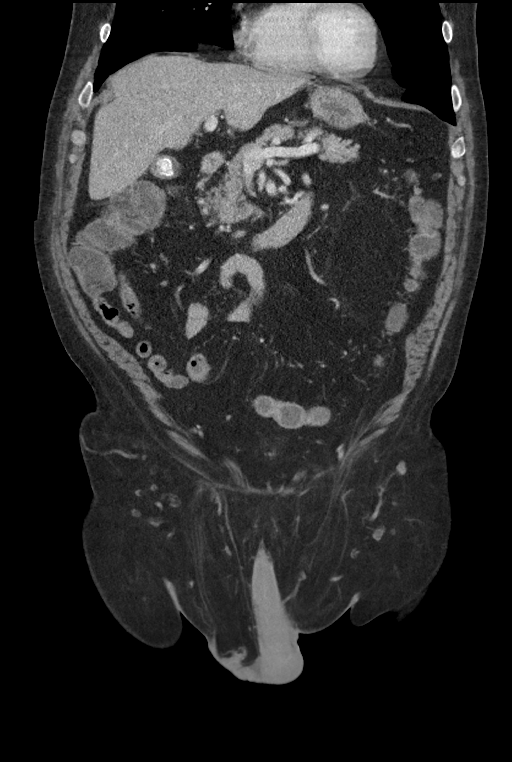
[im 69/124  soft-tissue]
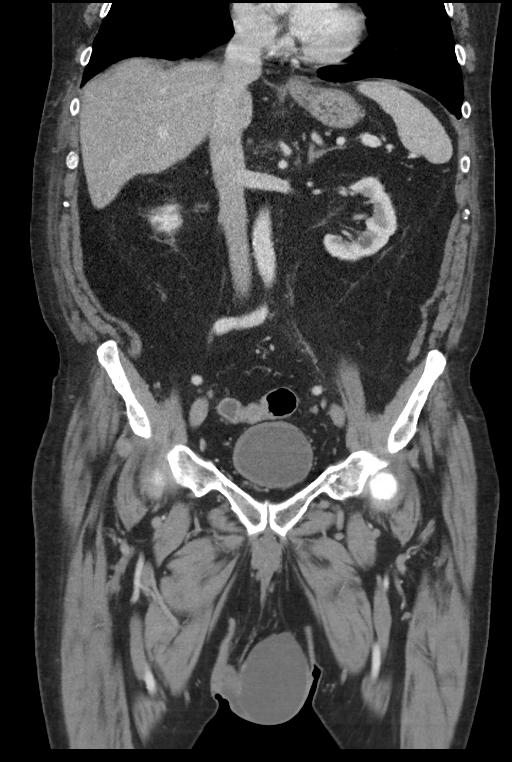

[15 of 46 positions shown; findings below may reference images not displayed]

FINDINGS: Lower chest: Lung bases clear. No pleural or pericardial effusion.
Heart size is normal.

Hepatobiliary: There are stones in the gallbladder. No evidence of
cholecystitis. Liver and biliary tree appear normal.

Pancreas: Unremarkable. No pancreatic ductal dilatation or
surrounding inflammatory changes.

Spleen: Normal in size without focal abnormality.

Adrenals/Urinary Tract: The adrenal glands appear normal. Small cyst
lower pole left kidney is seen. Ureters and urinary bladder appear
normal.

Stomach/Bowel: Stomach is within normal limits. Appendix appears
normal. No evidence of bowel wall thickening, distention, or
inflammatory changes. A few scattered diverticula are noted.

Vascular/Lymphatic: Aortic atherosclerosis. No enlarged abdominal or
pelvic lymph nodes.

Reproductive: Prostatomegaly. Large left and small right hydrocele
as seen on the ultrasound earlier today. Cause for the hydroceles is
not identified.

Other: The patient has bilateral fat containing inguinal hernias.

Musculoskeletal: No acute or focal abnormality.
IMPRESSION: Left larger than right hydroceles as seen on prior ultrasound. Cause
for the hydroceles is not identified.

Gallstones without cholecystitis.

Atherosclerosis.

Fat containing inguinal hernias.

Prostatomegaly.

## 2020-06-22 DIAGNOSIS — I10 Essential (primary) hypertension: Secondary | ICD-10-CM | POA: Diagnosis not present

## 2020-06-22 DIAGNOSIS — I509 Heart failure, unspecified: Secondary | ICD-10-CM | POA: Diagnosis not present

## 2020-06-22 DIAGNOSIS — J449 Chronic obstructive pulmonary disease, unspecified: Secondary | ICD-10-CM | POA: Diagnosis not present

## 2020-07-17 NOTE — Progress Notes (Deleted)
Cardiology Office Note:    Date:  07/17/2020   ID:  Mitchell [name], DOB Jan 08, 1941, MRN [id number]  PCP:  Daisy Floro, MD  Cardiologist:  Lesleigh Noe, MD   Referring MD: Daisy Floro, MD   No chief complaint on file.   History of Present Illness:    Mitchell [name] is a 80 y.o. male with a hx of ofcoronary artery diseasewith remote LAD/Diagonal cutting balloon PCI, history ofRCA?PDA DES, COPD,paroxysmal atrial flutter, chronic apixaban therapy, diastolic heart failure,claudication and hypertension.COPDhas progressed to requirement for home oxygen.  ***  Past Medical History:  Diagnosis Date  . Anginal pain (HCC) 01/2012  . Anxiety   . Atherosclerotic heart disease of native coronary artery without angina pectoris   . BPH (benign prostatic hypertrophy)   . CAD (coronary artery disease) 2007   with LAD/Diag CBPTCA and kissing   . CKD (chronic kidney disease), stage III    Hattie Perch 01/04/2017  . COPD (chronic obstructive pulmonary disease) (HCC)    "maybe" (02/14/2012)  . Coronary artery disease   . Depression   . GERD (gastroesophageal reflux disease)   . Hyperlipidemia   . Hypertension   . Hypoxemia   . Myocardial infarction (HCC) 01/22/2012   NSTEMI with DES to RSA (PDA)  . On home oxygen therapy   . Shortness of breath    "walking and laying down sometimes" (02/14/2012)    Past Surgical History:  Procedure Laterality Date  . CATARACT EXTRACTION W/ INTRAOCULAR LENS  IMPLANT, BILATERAL  ~ 2010  . CORONARY ANGIOPLASTY WITH STENT PLACEMENT  05/11/2005   PCI OF LAD  . GANGLION CYST REMOVED  1956?   right  . LEFT HEART CATHETERIZATION WITH CORONARY ANGIOGRAM Bilateral 01/24/2012   Procedure: LEFT HEART CATHETERIZATION WITH CORONARY ANGIOGRAM;  Surgeon: Lesleigh Noe, MD;  Location: [facility] CATH LAB;  Service: Cardiovascular;  Laterality: Bilateral;  . REFRACTIVE SURGERY  2010   left    Current Medications: No outpatient medications have been  marked as taking for the 07/18/20 encounter (Appointment) with Lyn Records, MD.     Allergies:   Lipitor [atorvastatin], Statins, and Rosuvastatin   Social History   Socioeconomic History  . Marital status: Single    Spouse name: Not on file  . Number of children: 3  . Years of education: Not on file  . Highest education level: Not on file  Occupational History  . Not on file  Tobacco Use  . Smoking status: Former Smoker    Years: 42.00    Types: Pipe    Quit date: 04/28/2011    Years since quitting: 9.2  . Smokeless tobacco: Never Used  Vaping Use  . Vaping Use: Never used  Substance and Sexual Activity  . Alcohol use: Yes    Alcohol/week: 7.0 standard drinks    Types: 7 Standard drinks or equivalent per week    Comment: 02/14/2012 "weekends I have 6-7 mixed drinks"     occasional  . Drug use: No  . Sexual activity: Yes  Other Topics Concern  . Not on file  Social History Narrative   Truck driver      Social Determinants of Health   Financial Resource Strain: Not on file  Food Insecurity: Not on file  Transportation Needs: Not on file  Physical Activity: Not on file  Stress: Not on file  Social Connections: Not on file     Family History: The patient's family history includes Heart failure  in his brother and mother.  ROS:   Please see the history of present illness.    *** All other systems reviewed and are negative.  EKGs/Labs/Other Studies Reviewed:    The following studies were reviewed today: ***  EKG:  EKG ***  Recent Labs: No results found for requested labs within last 8760 hours.  Recent Lipid Panel    Component Value Date/Time   CHOL 167 02/05/2018 0744   TRIG 83 02/05/2018 0744   HDL 54 02/05/2018 0744   CHOLHDL 3.1 02/05/2018 0744   CHOLHDL 4.9 01/12/2017 0503   VLDL 21 01/12/2017 0503   LDLCALC 96 02/05/2018 0744    Physical Exam:    VS:  There were no vitals taken for this visit.    Wt Readings from Last 3 Encounters:   06/22/19 199 lb (90.3 kg)  03/02/19 207 lb (93.9 kg)  02/13/19 207 lb (93.9 kg)     GEN: ***. No acute distress HEENT: Normal NECK: No JVD. LYMPHATICS: No lymphadenopathy CARDIAC: *** murmur. RRR *** gallop, or edema. VASCULAR: *** Normal Pulses. No bruits. RESPIRATORY:  Clear to auscultation without rales, wheezing or rhonchi  ABDOMEN: Soft, non-tender, non-distended, No pulsatile mass, MUSCULOSKELETAL: No deformity  SKIN: Warm and dry NEUROLOGIC:  Alert and oriented x 3 PSYCHIATRIC:  Normal affect   ASSESSMENT:    1. Coronary artery disease involving native coronary artery of native heart with angina pectoris (HCC)   2. Essential hypertension   3. Stage 3a chronic kidney disease (HCC)   4. Chronic diastolic heart failure (HCC)   5. Chronic obstructive pulmonary disease, unspecified COPD type (HCC)    PLAN:    In order of problems listed above:  1. ***   Medication Adjustments/Labs and Tests Ordered: Current medicines are reviewed at length with the patient today.  Concerns regarding medicines are outlined above.  No orders of the defined types were placed in this encounter.  No orders of the defined types were placed in this encounter.   There are no Patient Instructions on file for this visit.   Signed, Lesleigh Noe, MD  07/17/2020 1:32 PM    [facility]

## 2020-07-18 ENCOUNTER — Ambulatory Visit: Payer: Medicare PPO | Admitting: Interventional Cardiology

## 2020-07-18 DIAGNOSIS — I5032 Chronic diastolic (congestive) heart failure: Secondary | ICD-10-CM

## 2020-07-18 DIAGNOSIS — I25119 Atherosclerotic heart disease of native coronary artery with unspecified angina pectoris: Secondary | ICD-10-CM

## 2020-07-18 DIAGNOSIS — J449 Chronic obstructive pulmonary disease, unspecified: Secondary | ICD-10-CM

## 2020-07-18 DIAGNOSIS — N1831 Chronic kidney disease, stage 3a: Secondary | ICD-10-CM

## 2020-07-18 DIAGNOSIS — I10 Essential (primary) hypertension: Secondary | ICD-10-CM

## 2020-07-18 DIAGNOSIS — Z7189 Other specified counseling: Secondary | ICD-10-CM

## 2020-07-23 DIAGNOSIS — J449 Chronic obstructive pulmonary disease, unspecified: Secondary | ICD-10-CM | POA: Diagnosis not present

## 2020-07-23 DIAGNOSIS — I10 Essential (primary) hypertension: Secondary | ICD-10-CM | POA: Diagnosis not present

## 2020-07-23 DIAGNOSIS — I509 Heart failure, unspecified: Secondary | ICD-10-CM | POA: Diagnosis not present

## 2020-07-28 DIAGNOSIS — M79671 Pain in right foot: Secondary | ICD-10-CM | POA: Diagnosis not present

## 2020-07-28 DIAGNOSIS — J449 Chronic obstructive pulmonary disease, unspecified: Secondary | ICD-10-CM | POA: Diagnosis not present

## 2020-08-22 DIAGNOSIS — I10 Essential (primary) hypertension: Secondary | ICD-10-CM | POA: Diagnosis not present

## 2020-08-22 DIAGNOSIS — J449 Chronic obstructive pulmonary disease, unspecified: Secondary | ICD-10-CM | POA: Diagnosis not present

## 2020-08-22 DIAGNOSIS — I509 Heart failure, unspecified: Secondary | ICD-10-CM | POA: Diagnosis not present

## 2020-09-22 DIAGNOSIS — J449 Chronic obstructive pulmonary disease, unspecified: Secondary | ICD-10-CM | POA: Diagnosis not present

## 2020-09-22 DIAGNOSIS — I509 Heart failure, unspecified: Secondary | ICD-10-CM | POA: Diagnosis not present

## 2020-09-22 DIAGNOSIS — I10 Essential (primary) hypertension: Secondary | ICD-10-CM | POA: Diagnosis not present

## 2020-09-24 ENCOUNTER — Other Ambulatory Visit: Payer: Self-pay | Admitting: Interventional Cardiology

## 2020-10-20 ENCOUNTER — Telehealth: Payer: Self-pay | Admitting: Interventional Cardiology

## 2020-10-20 NOTE — Telephone Encounter (Signed)
Patient calling the office for samples of medication:   1.  What medication and dosage are you requesting samples for? ELIQUIS 5 MG TABS tablet  2.  Are you currently out of this medication? No, will run out the end of the week

## 2020-10-20 NOTE — Telephone Encounter (Signed)
Hey Lynn, LPN, can you please advise on this matter? Thanks  ?

## 2020-10-21 NOTE — Telephone Encounter (Signed)
Pt was given a patient assistance application and 2 boxes of Eliquis 5 mg tablet, 2 weeks supply, asking pt to fill out his part and return application to the [address] office to [facility], patient assistance coordinator. Pt verbalized understanding.   Lot# [id number]   Exp: 09/2022  FYI

## 2020-10-21 NOTE — Telephone Encounter (Signed)
**Note De-Identified  Obfuscation** The pt is almost out of hisEliquis and is requesting samples while he applys a waits for determination from [employer]. He is aware that we are leaving him 2 boxes of Eliquis 5 mg samples and a [employer] application to pick up from the front office at Dr Lonn Georgia office at [facility] on [address] in [place].  He has applied in the past and states he remembers what to do and that he will bring his application back to the office to drop off as soon as he can.get it completed and obtains the required documents per [employer].  He thanked me for our help and is aware to call Larita Fife at Dr [facility] office if he has any questions or needs asst filling out his application.

## 2020-10-22 DIAGNOSIS — J449 Chronic obstructive pulmonary disease, unspecified: Secondary | ICD-10-CM | POA: Diagnosis not present

## 2020-10-22 DIAGNOSIS — I509 Heart failure, unspecified: Secondary | ICD-10-CM | POA: Diagnosis not present

## 2020-10-22 DIAGNOSIS — I10 Essential (primary) hypertension: Secondary | ICD-10-CM | POA: Diagnosis not present

## 2020-10-25 ENCOUNTER — Telehealth: Payer: Self-pay

## 2020-10-25 NOTE — Telephone Encounter (Signed)
**Note De-Identified  Obfuscation** A completed [employer] application for Eliquis was left at the office.without any documents. I added the pts meds, allergies,and ins information. I completed the provider page of the application and emailed all to Dr Lonn Georgia nurse so she can obtain his signature, date it and to fax all to [facility] at the fax number written on the cover letter included or to place in the to be faxed box in Medical Records to be faxed

## 2020-11-01 NOTE — Telephone Encounter (Signed)
Paperwork completed and faxed.  °

## 2020-11-02 DIAGNOSIS — R001 Bradycardia, unspecified: Secondary | ICD-10-CM | POA: Diagnosis not present

## 2020-11-02 DIAGNOSIS — F4321 Adjustment disorder with depressed mood: Secondary | ICD-10-CM | POA: Diagnosis not present

## 2020-11-02 DIAGNOSIS — Z7722 Contact with and (suspected) exposure to environmental tobacco smoke (acute) (chronic): Secondary | ICD-10-CM | POA: Diagnosis not present

## 2020-11-02 DIAGNOSIS — J449 Chronic obstructive pulmonary disease, unspecified: Secondary | ICD-10-CM | POA: Diagnosis not present

## 2020-11-02 DIAGNOSIS — I10 Essential (primary) hypertension: Secondary | ICD-10-CM | POA: Diagnosis not present

## 2020-11-14 DIAGNOSIS — E8770 Fluid overload, unspecified: Secondary | ICD-10-CM | POA: Diagnosis not present

## 2020-11-14 DIAGNOSIS — M549 Dorsalgia, unspecified: Secondary | ICD-10-CM | POA: Diagnosis not present

## 2020-11-14 DIAGNOSIS — R350 Frequency of micturition: Secondary | ICD-10-CM | POA: Diagnosis not present

## 2020-11-14 DIAGNOSIS — I10 Essential (primary) hypertension: Secondary | ICD-10-CM | POA: Diagnosis not present

## 2020-11-14 DIAGNOSIS — R0602 Shortness of breath: Secondary | ICD-10-CM | POA: Diagnosis not present

## 2020-11-22 DIAGNOSIS — I509 Heart failure, unspecified: Secondary | ICD-10-CM | POA: Diagnosis not present

## 2020-11-22 DIAGNOSIS — I10 Essential (primary) hypertension: Secondary | ICD-10-CM | POA: Diagnosis not present

## 2020-11-22 DIAGNOSIS — J449 Chronic obstructive pulmonary disease, unspecified: Secondary | ICD-10-CM | POA: Diagnosis not present

## 2020-12-01 ENCOUNTER — Telehealth: Payer: Self-pay | Admitting: Interventional Cardiology

## 2020-12-01 NOTE — Telephone Encounter (Signed)
**Note De-Identified  Obfuscation** I called [employer] and s/w Marthenia Rolling who advised me that the last application that they received on the pt is from 2/22 and that all they need is his out of pocket expense report from his pharmacy.  I called the pt and he states that he will try to get someone to take him to his pharmacy to get his 2022 out of pocket expense report and will bring it to the office to drop off.  He is aware that we are leaving him 2 boxes of Eliquis 5 mg samples that he can pick up while dropping of his oop expense report at Dr [employer] office at [facility] on [address]. In [place].  He verbalized understanding and thanked me for our assistance.

## 2020-12-01 NOTE — Telephone Encounter (Signed)
Patient calling the office for samples of medication:   1.  What medication and dosage are you requesting samples for? ELIQUIS 5 MG TABS tablet  2.  Are you currently out of this medication? No. Patient has one day left    Patient has applied for the patient assistance program but has not gotten a response yet. The patient will need two weeks worth of samples to hold him over

## 2020-12-08 NOTE — Telephone Encounter (Signed)
Paperwork obtained and everything was faxed over.

## 2020-12-13 ENCOUNTER — Telehealth: Payer: Self-pay | Admitting: Interventional Cardiology

## 2020-12-13 ENCOUNTER — Emergency Department (HOSPITAL_COMMUNITY): Payer: Medicare PPO

## 2020-12-13 ENCOUNTER — Encounter (HOSPITAL_COMMUNITY): Payer: Self-pay | Admitting: Emergency Medicine

## 2020-12-13 ENCOUNTER — Other Ambulatory Visit: Payer: Self-pay

## 2020-12-13 ENCOUNTER — Emergency Department (HOSPITAL_COMMUNITY)
Admission: EM | Admit: 2020-12-13 | Discharge: 2020-12-13 | Disposition: A | Discharge status: Home / Self Care | Payer: Medicare PPO | Attending: Emergency Medicine | Admitting: Emergency Medicine

## 2020-12-13 DIAGNOSIS — Z7982 Long term (current) use of aspirin: Secondary | ICD-10-CM | POA: Insufficient documentation

## 2020-12-13 DIAGNOSIS — N183 Chronic kidney disease, stage 3 unspecified: Secondary | ICD-10-CM | POA: Diagnosis not present

## 2020-12-13 DIAGNOSIS — I129 Hypertensive chronic kidney disease with stage 1 through stage 4 chronic kidney disease, or unspecified chronic kidney disease: Secondary | ICD-10-CM | POA: Diagnosis not present

## 2020-12-13 DIAGNOSIS — I2510 Atherosclerotic heart disease of native coronary artery without angina pectoris: Secondary | ICD-10-CM | POA: Insufficient documentation

## 2020-12-13 DIAGNOSIS — Z87891 Personal history of nicotine dependence: Secondary | ICD-10-CM | POA: Insufficient documentation

## 2020-12-13 DIAGNOSIS — Z955 Presence of coronary angioplasty implant and graft: Secondary | ICD-10-CM | POA: Diagnosis not present

## 2020-12-13 DIAGNOSIS — Z7901 Long term (current) use of anticoagulants: Secondary | ICD-10-CM | POA: Diagnosis not present

## 2020-12-13 DIAGNOSIS — Z79899 Other long term (current) drug therapy: Secondary | ICD-10-CM | POA: Diagnosis not present

## 2020-12-13 DIAGNOSIS — Z20822 Contact with and (suspected) exposure to covid-19: Secondary | ICD-10-CM | POA: Diagnosis not present

## 2020-12-13 DIAGNOSIS — R600 Localized edema: Secondary | ICD-10-CM | POA: Insufficient documentation

## 2020-12-13 DIAGNOSIS — R9431 Abnormal electrocardiogram [ECG] [EKG]: Secondary | ICD-10-CM | POA: Diagnosis not present

## 2020-12-13 DIAGNOSIS — J441 Chronic obstructive pulmonary disease with (acute) exacerbation: Secondary | ICD-10-CM | POA: Diagnosis not present

## 2020-12-13 DIAGNOSIS — R0602 Shortness of breath: Secondary | ICD-10-CM | POA: Diagnosis not present

## 2020-12-13 LAB — BASIC METABOLIC PANEL
Anion gap: 8 (ref 5–15)
BUN: 26 mg/dL — ABNORMAL HIGH (ref 8–23)
CO2: 33 mmol/L — ABNORMAL HIGH (ref 22–32)
Calcium: 9.1 mg/dL (ref 8.9–10.3)
Chloride: 99 mmol/L (ref 98–111)
Creatinine, Ser: 1.56 mg/dL — ABNORMAL HIGH (ref 0.61–1.24)
GFR, Estimated: 44 mL/min — ABNORMAL LOW (ref >60)
Glucose, Bld: 90 mg/dL (ref 70–99)
Potassium: 3.5 mmol/L (ref 3.5–5.1)
Sodium: 140 mmol/L (ref 135–145)

## 2020-12-13 LAB — CBC
HCT: 53.9 % — ABNORMAL HIGH (ref 39.0–52.0)
Hemoglobin: 17.3 g/dL — ABNORMAL HIGH (ref 13.0–17.0)
MCH: 29.6 pg (ref 26.0–34.0)
MCHC: 32.1 g/dL (ref 30.0–36.0)
MCV: 92.3 fL (ref 80.0–100.0)
Platelets: 218 10*3/uL (ref 150–400)
RBC: 5.84 MIL/uL — ABNORMAL HIGH (ref 4.22–5.81)
RDW: 15.9 % — ABNORMAL HIGH (ref 11.5–15.5)
WBC: 7.6 10*3/uL (ref 4.0–10.5)
nRBC: 0 % (ref 0.0–0.2)

## 2020-12-13 LAB — RESP PANEL BY RT-PCR (FLU A&B, COVID) ARPGX2
Influenza A by PCR: NEGATIVE
Influenza B by PCR: NEGATIVE
SARS Coronavirus 2 by RT PCR: NEGATIVE

## 2020-12-13 LAB — BRAIN NATRIURETIC PEPTIDE: B Natriuretic Peptide: 21.3 pg/mL (ref 0.0–100.0)

## 2020-12-13 LAB — TROPONIN I (HIGH SENSITIVITY)
Troponin I (High Sensitivity): 7 ng/L (ref <18)
Troponin I (High Sensitivity): 6 ng/L (ref <18)

## 2020-12-13 MED ORDER — ALBUTEROL SULFATE HFA 108 (90 BASE) MCG/ACT IN AERS
8.0000 | INHALATION_SPRAY | Freq: Once | RESPIRATORY_TRACT | Status: AC
  Administered 2020-12-13: 8 via RESPIRATORY_TRACT
  Filled 2020-12-13: qty 6.7

## 2020-12-13 MED ORDER — PREDNISONE 10 MG PO TABS: 40.0000 mg | ORAL_TABLET | Freq: Every day | ORAL | 0 refills | Status: AC

## 2020-12-13 MED ORDER — IPRATROPIUM BROMIDE HFA 17 MCG/ACT IN AERS
2.0000 | INHALATION_SPRAY | Freq: Once | RESPIRATORY_TRACT | Status: AC
  Administered 2020-12-13: 2 via RESPIRATORY_TRACT
  Filled 2020-12-13: qty 12.9

## 2020-12-13 MED ORDER — METHYLPREDNISOLONE SODIUM SUCC 125 MG IJ SOLR
125.0000 mg | Freq: Once | INTRAMUSCULAR | Status: AC
  Administered 2020-12-13: 125 mg via INTRAVENOUS
  Filled 2020-12-13: qty 2

## 2020-12-13 NOTE — Telephone Encounter (Signed)
Pt states he has had SOB x 1 week.  Worse when lying down.  Got so bad Friday that he called 911.  EMS wanted to take him to the hospital but pt refused due to his son passing at [facility] and had been transported by EMS.  Continues to have breathing issues.  Normally uses 2LPM but has increased to 4LPM since Saturday.  O2 at 97% on the phone.  Pt unable to finish sentences without running out of breath on the phone.  Advised pt he really does need to report to the ER for eval.  Pt hesitant but was finally agreeable.

## 2020-12-13 NOTE — Discharge Instructions (Addendum)
Use your inhaler every 4 hours(6 puffs) while awake, return for sudden worsening shortness of breath, or if you need to use your inhaler more often.  ° °

## 2020-12-13 NOTE — ED Provider Notes (Signed)
Emergency Medicine Provider Triage Evaluation Note  Mitchell [name] , a 80 y.o. male  was evaluated in triage.  Pt complains of sob and chest pain. On chronic 2l, had to turn it up to 4l  Review of Systems  Positive: Sob, chest pain Negative: fever  Physical Exam  BP 139/85 (BP Location: Right Arm)   Pulse 69   Temp 98 F (36.7 C) (Oral)   Resp 20   SpO2 95%  Gen:   Awake, no distress   Resp:  Normal effort MSK:   Moves extremities without difficulty  Other:  Heart rrr, lungs with decreased bs but clear  Medical Decision Making  Medically screening exam initiated at 4:34 PM.  Appropriate orders placed.  [name] was informed that the remainder of the evaluation will be completed by another provider, this initial triage assessment does not replace that evaluation, and the importance of remaining in the ED until their evaluation is complete.     Karrie Meres, PA-C 12/13/20 1650    Melene Plan, DO 12/13/20 2246

## 2020-12-13 NOTE — ED Provider Notes (Signed)
[facility] EMERGENCY DEPARTMENT Provider Note   CSN: [id number] Arrival date & time: 12/13/20  1622     History Chief Complaint  Patient presents with   Shortness of Breath    Mitchell [name] is a 80 y.o. male.  80 yo M with a chief complaint of shortness of breath.  Is been an ongoing issue for him but worsening over the past week.  Patient has a history of COPD is not sure if this feels like COPD or not.  Has slowly uptitrated his oxygen from 2 L to 4.  Significantly worse with exertion.  Has had a bit of a cough.  Denies fever.  Denies chest pain.  Feels tightness in his chest at times.  Uses inhaler as prescribed.  Denies any significant lower extremity edema.  Denies abdominal pain.  The history is provided by the patient (ex wife).  Shortness of Breath Severity:  Moderate Onset quality:  Gradual Duration:  1 week Timing:  Constant Progression:  Worsening Chronicity:  New Context: activity   Relieved by:  Nothing Worsened by:  Nothing Ineffective treatments:  None tried Associated symptoms: cough   Associated symptoms: no abdominal pain, no chest pain, no fever, no headaches, no rash and no vomiting       Past Medical History:  Diagnosis Date   Anginal pain (HCC) 01/2012   Anxiety    Atherosclerotic heart disease of native coronary artery without angina pectoris    BPH (benign prostatic hypertrophy)    CAD (coronary artery disease) 2007   with LAD/Diag CBPTCA and kissing    CKD (chronic kidney disease), stage III (HCC)    Hattie Perch 01/04/2017   COPD (chronic obstructive pulmonary disease) (HCC)    "maybe" (02/14/2012)   Coronary artery disease    Depression    GERD (gastroesophageal reflux disease)    Hyperlipidemia    Hypertension    Hypoxemia    Myocardial infarction (HCC) 01/22/2012   NSTEMI with DES to RSA (PDA)   On home oxygen therapy    Shortness of breath    "walking and laying down sometimes" (02/14/2012)    Patient Active  Problem List   Diagnosis Date Noted   Effusion, right knee 01/10/2017   Osteoarthritis of right knee 01/10/2017   Gout of right knee 01/10/2017   Dehydration    Atypical atrial flutter (HCC)    Dizziness 01/04/2017   Right shoulder pain 01/04/2017   Hypokalemia 01/04/2017   Influenza 05/10/2016   CKD (chronic kidney disease), stage III (HCC) 05/10/2016   Acute respiratory failure with hypoxia (HCC) 05/02/2016   AKI (acute kidney injury) (HCC) 05/02/2016   Gout flare 05/02/2016   Chronic respiratory failure (HCC) 03/15/2015   Vision impairment 01/31/2015   Right foot pain 01/31/2015   Obesity 01/31/2015   Alcohol use 01/31/2015   COPD (chronic obstructive pulmonary disease) (HCC) 04/30/2013   Dyspnea 02/14/2012   Hyperlipemia    History of depression    Chest pain 01/23/2012   Coronary artery disease involving native coronary artery of native heart with angina pectoris (HCC) 01/23/2012   Cholelithiasis    Essential hypertension 04/29/2011    Past Surgical History:  Procedure Laterality Date   CATARACT EXTRACTION W/ INTRAOCULAR LENS  IMPLANT, BILATERAL  ~ 2010   CORONARY ANGIOPLASTY WITH STENT PLACEMENT  05/11/2005   PCI OF LAD   GANGLION CYST REMOVED  1956?   right   LEFT HEART CATHETERIZATION WITH CORONARY ANGIOGRAM Bilateral 01/24/2012   Procedure:  LEFT HEART CATHETERIZATION WITH CORONARY ANGIOGRAM;  Surgeon: Lesleigh Noe, MD;  Location: [facility] CATH LAB;  Service: Cardiovascular;  Laterality: Bilateral;   REFRACTIVE SURGERY  2010   left       Family History  Problem Relation Age of Onset   Heart failure Mother    Heart failure Brother     Social History   Tobacco Use   Smoking status: Former    Types: Pipe    Quit date: 04/28/2011    Years since quitting: 9.6   Smokeless tobacco: Never  Vaping Use   Vaping Use: Never used  Substance Use Topics   Alcohol use: Yes    Alcohol/week: 7.0 standard drinks    Types: 7 Standard drinks or equivalent per week     Comment: 02/14/2012 "weekends I have 6-7 mixed drinks"     occasional   Drug use: No    Home Medications Prior to Admission medications   Medication Sig Start Date End Date Taking? Authorizing Provider  predniSONE (DELTASONE) 10 MG tablet Take 4 tablets (40 mg total) by mouth daily for 4 days. 12/13/20 12/17/20 Yes Melene Plan, DO  acetaminophen (ACETAMINOPHEN EXTRA STRENGTH) 500 MG tablet Take 500 mg by mouth every 6 (six) hours as needed.    [provider]  albuterol (PROVENTIL HFA;VENTOLIN HFA) 108 (90 BASE) MCG/ACT inhaler Inhale 2 puffs into the lungs every 6 (six) hours as needed for wheezing. Reported on 05/04/2015    [provider]  aspirin EC 81 MG tablet Take 1 tablet (81 mg total) by mouth daily. 12/03/16   Lyn Records, MD  BISACODYL PO Take 1 tablet by mouth daily as needed.    [provider]  diclofenac (VOLTAREN) 75 MG EC tablet 75 mg. Take 2 tablets at the onset of gout then 1 tablet twice daily as needed.    [provider]  ELIQUIS 5 MG TABS tablet TAKE 1 TABLET (5 MG TOTAL) BY MOUTH 2 (TWO) TIMES DAILY. NEED ANNUAL MD APPT FOR REFILLS 05/04/20   Lyn Records, MD  finasteride (PROSCAR) 5 MG tablet Take 5 mg by mouth daily. Reported on 05/04/2015    [provider]  furosemide (LASIX) 40 MG tablet Take 40 mg by mouth daily.  05/29/18   [provider]  HYDROcodone-acetaminophen (NORCO) 5-325 MG tablet Take 1 tablet by mouth every 8 (eight) hours as needed for severe pain. 02/13/19   Marshia Ly, PA-C  metoprolol tartrate (LOPRESSOR) 25 MG tablet TAKE 1 TABLET TWICE DAILY 03/22/20   Lyn Records, MD  mometasone-formoterol [facility]) 100-5 MCG/ACT AERO Inhale 2 puffs into the lungs 2 (two) times daily. 02/08/15   Oretha Milch, MD  Multiple Vitamins-Minerals (EYE VITAMINS & MINERALS PO) Take 1 tablet by mouth daily.    [provider]  nitroGLYCERIN (NITROSTAT) 0.4 MG SL tablet Place 1 tablet (0.4 mg total) under the  tongue every 5 (five) minutes as needed. Reported on 05/04/2015 08/20/17   Leone Brand, NP  potassium chloride (KLOR-CON 10) 10 MEQ tablet Take 1 tablet (10 mEq total) by mouth daily. 12/25/17   Lyn Records, MD  pravastatin (PRAVACHOL) 20 MG tablet TAKE 1/2 TABLET EVERY EVENING. Please keep upcoming appt in April 2022 with Dr. Katrinka Blazing for future refills. Thank you 04/06/20   Lyn Records, MD  tamsulosin [facility]) 0.4 MG CAPS capsule Take 0.4 mg by mouth daily after breakfast. Reported on 05/04/2015 03/19/13   [provider]  Allergies    Lipitor [atorvastatin], Statins, and Rosuvastatin  Review of Systems   Review of Systems  Constitutional:  Positive for fatigue. Negative for chills and fever.  HENT:  Negative for congestion and facial swelling.   Eyes:  Negative for discharge and visual disturbance.  Respiratory:  Positive for cough and shortness of breath.   Cardiovascular:  Negative for chest pain and palpitations.  Gastrointestinal:  Negative for abdominal pain, diarrhea and vomiting.  Musculoskeletal:  Negative for arthralgias and myalgias.  Skin:  Negative for color change and rash.  Neurological:  Negative for tremors, syncope and headaches.  Psychiatric/Behavioral:  Negative for confusion and dysphoric mood.    Physical Exam Updated Vital Signs BP (!) 142/91   Pulse 72   Temp 98 F (36.7 C) (Oral)   Resp 11   SpO2 97%   Physical Exam Vitals and nursing note reviewed.  Constitutional:      Appearance: He is well-developed.  HENT:     Head: Normocephalic and atraumatic.  Eyes:     Pupils: Pupils are equal, round, and reactive to light.  Neck:     Vascular: No JVD.  Cardiovascular:     Rate and Rhythm: Normal rate and regular rhythm.     Heart sounds: No murmur heard.   No friction rub. No gallop.  Pulmonary:     Effort: No respiratory distress.     Breath sounds: Decreased breath sounds (diminished in all fields) and wheezing (end expiratory,  prolonged expiration) present.  Abdominal:     General: There is no distension.     Tenderness: There is no abdominal tenderness. There is no guarding or rebound.  Musculoskeletal:        General: Normal range of motion.     Cervical back: Normal range of motion and neck supple.     Right lower leg: Edema present.     Left lower leg: Edema present.     Comments: Trace edema to BLE  Skin:    Coloration: Skin is not pale.     Findings: No rash.  Neurological:     Mental Status: He is alert and oriented to person, place, and time.  Psychiatric:        Behavior: Behavior normal.    ED Results / Procedures / Treatments   Labs (all labs ordered are listed, but only abnormal results are displayed) Labs Reviewed  BASIC METABOLIC PANEL - Abnormal; Notable for the following components:      Result Value   CO2 33 (*)    BUN 26 (*)    Creatinine, Ser 1.56 (*)    GFR, Estimated 44 (*)    All other components within normal limits  CBC - Abnormal; Notable for the following components:   RBC 5.84 (*)    Hemoglobin 17.3 (*)    HCT 53.9 (*)    RDW 15.9 (*)    All other components within normal limits  RESP PANEL BY RT-PCR (FLU A&B, COVID) ARPGX2  BRAIN NATRIURETIC PEPTIDE  TROPONIN I (HIGH SENSITIVITY)  TROPONIN I (HIGH SENSITIVITY)    EKG EKG Interpretation  Date/Time:  Tuesday December 13 2020 16:34:22 EDT Ventricular Rate:  67 PR Interval:  208 QRS Duration: 70 QT Interval:  414 QTC Calculation: 437 R Axis:   50 Text Interpretation: Sinus rhythm with Premature atrial complexes in a pattern of bigeminy Nonspecific ST abnormality Abnormal ECG Since last tracing rate slower Otherwise no significant change Confirmed by Melene Plan [phone]) on 12/13/2020  6:12:32 PM  Radiology DG Chest Port 1 View  Result Date: 12/13/2020 CLINICAL DATA:  Shortness of breath. EXAM: PORTABLE CHEST 1 VIEW COMPARISON:  Chest x-ray 05/10/2017, CT chest 05/13/2016 FINDINGS: The heart and mediastinal contours  are unchanged. Aortic calcification. No focal consolidation. No pulmonary edema. No pleural effusion. No pneumothorax. No acute osseous abnormality. IMPRESSION: No active disease. Electronically Signed   By: Tish Frederickson M.D.   On: 12/13/2020 19:38    Procedures Procedures   Medications Ordered in ED Medications  albuterol (VENTOLIN HFA) 108 (90 Base) MCG/ACT inhaler 8 puff (8 puffs Inhalation Given 12/13/20 1941)  ipratropium (ATROVENT HFA) inhaler 2 puff (2 puffs Inhalation Given 12/13/20 1942)  methylPREDNISolone sodium succinate (SOLU-MEDROL) 125 mg/2 mL injection 125 mg (125 mg Intravenous Given 12/13/20 1939)    ED Course  I have reviewed the triage vital signs and the nursing notes.  Pertinent labs & imaging results that were available during my care of the patient were reviewed by me and considered in my medical decision making (see chart for details).    MDM Rules/Calculators/A&P                           80 yo M with a chief complaint of shortness of breath.  Is been an ongoing issue for him but worsening over the past week.  Lung sounds most consistent with a COPD exacerbation.  Will give 8 puffs albuterol to Atrovent steroids and reassess.  Chest x-ray blood work.  Chest x-ray viewed by me without focal infiltrate.  Troponin negative x2.  Patient significantly improved with breathing treatments here.  Has taken off his oxygen and tells me that he feels way better and would like to go home.  We will have him follow-up with his family doctor.  He tells me that even though he is oxygen dependent due to his COPD he does not see pulmonology regularly.  We will give him information to try and make a appointment.  8:13 PM:  I have discussed the diagnosis/risks/treatment options with the patient and family and believe the pt to be eligible for discharge home to follow-up with PCP, pulm. We also discussed returning to the ED immediately if new or worsening sx occur. We discussed the sx  which are most concerning (e.g., sudden worsening sob, fever, inability to tolerate by mouth, need to use inhaler more often than every 4 hours) that necessitate immediate return. Medications administered to the patient during their visit and any new prescriptions provided to the patient are listed below.  Medications given during this visit Medications  albuterol (VENTOLIN HFA) 108 (90 Base) MCG/ACT inhaler 8 puff (8 puffs Inhalation Given 12/13/20 1941)  ipratropium (ATROVENT HFA) inhaler 2 puff (2 puffs Inhalation Given 12/13/20 1942)  methylPREDNISolone sodium succinate (SOLU-MEDROL) 125 mg/2 mL injection 125 mg (125 mg Intravenous Given 12/13/20 1939)     The patient appears reasonably screen and/or stabilized for discharge and I doubt any other medical condition or other [facility] requiring further screening, evaluation, or treatment in the ED at this time prior to discharge.   Final Clinical Impression(s) / ED Diagnoses Final diagnoses:  COPD exacerbation (HCC)    Rx / DC Orders ED Discharge Orders          Ordered    predniSONE (DELTASONE) 10 MG tablet  Daily        12/13/20 2011  Melene PlanFloyd, Angelin Cutrone, DO 12/13/20 2013

## 2020-12-13 NOTE — Telephone Encounter (Signed)
   Pt c/o Shortness Of Breath: STAT if SOB developed within the last 24 hours or pt is noticeably SOB on the phone  1. Are you currently SOB (can you hear that pt is SOB on the phone)? No  2. How long have you been experiencing SOB? in a week  3. Are you SOB when sitting or when up moving around? Laying down  4. Are you currently experiencing any other symptoms? Pt said he feels like he has water in his lungs, he gets very SOB when he lays down, last friday he called 911 because of severed SOB. He wanted to see Dr. Katrinka Blazing sooner than his October appt

## 2020-12-13 NOTE — ED Triage Notes (Addendum)
Pt c/o increased shortness of breath, worse with exertion and intermittent chest pain since Friday. States he has oxygen at home that he has had to increase the usage. Denies new swelling.

## 2020-12-14 ENCOUNTER — Telehealth: Payer: Self-pay | Admitting: Pulmonary Disease

## 2020-12-14 NOTE — Telephone Encounter (Signed)
At this time, there is no openings with a provider for a consult.  Patient is scheduled 01/13/21, with Dr. Francine Graven.   Called and spoke with Patient.  Patient advised to call for openings and that I would route a message to the front desk pool in case of a cancellation.   Message routed to front desk pool

## 2020-12-15 DIAGNOSIS — R0902 Hypoxemia: Secondary | ICD-10-CM | POA: Diagnosis not present

## 2020-12-15 DIAGNOSIS — Z23 Encounter for immunization: Secondary | ICD-10-CM | POA: Diagnosis not present

## 2020-12-15 DIAGNOSIS — J449 Chronic obstructive pulmonary disease, unspecified: Secondary | ICD-10-CM | POA: Diagnosis not present

## 2020-12-20 ENCOUNTER — Telehealth: Payer: Self-pay | Admitting: Interventional Cardiology

## 2020-12-20 DIAGNOSIS — I484 Atypical atrial flutter: Secondary | ICD-10-CM

## 2020-12-20 NOTE — Telephone Encounter (Signed)
Eliquis 5mg  is the correct dose for the pt at this time.   Please see comment from Pharmacist Melissa. There is a note on Dr. appt to obtain labs and I placed an order for CBC & BMET to evaluate dosing.

## 2020-12-20 NOTE — Telephone Encounter (Signed)
Patient calling the office for samples of medication:   1.  What medication and dosage are you requesting samples for? ELIQUIS 5 MG TABS tablet  2.  Are you currently out of this medication?   No, patient states he will be out of medication on 12/22/20. He is hopeful for a 3 week supply to last him until the patient assistance is approved.

## 2020-12-20 NOTE — Telephone Encounter (Signed)
Does pt get samples of Eliquis?

## 2020-12-20 NOTE — Telephone Encounter (Signed)
Eliquis 5mg  sample request received. Patient is 80 years old, weight-90.3kg, Crea-1.56 on 12/13/20 (same day he went to ER with COPD exacerbation), Diagnosis-Afib, and last seen by Dr. 12/15/20 on 01/27/2019 and pending an appt on 01/27/21. Dose is inappropriate based on dosing criteria.   Will have pharmacist to review before sending message to Dr. 01/29/21 due to last creatinine was during acute issue.

## 2020-12-20 NOTE — Telephone Encounter (Signed)
Last scr was >1.5 however this was during an accute illness. All other scr were <1.5. Therefore I would not make any dose change at this time. Scr should be rechecked at visit with Dr. Katrinka Blazing in October.

## 2020-12-20 NOTE — Telephone Encounter (Signed)
Yes, patient can have 3 weeks of Eliquis 5mg  samples until patient assistance approved

## 2020-12-21 NOTE — Telephone Encounter (Signed)
I called pt to inform him that I will be leaving 3 weeks of Eliquis 5 mg tablet samples at the front desk for pt to pick up. I advised the pt that if he has any other problems, questions or concerns, to give our office a call back. Pt verbalized  understanding.  LOT# [id number]   EXP: 10/2022

## 2020-12-23 DIAGNOSIS — I509 Heart failure, unspecified: Secondary | ICD-10-CM | POA: Diagnosis not present

## 2020-12-23 DIAGNOSIS — J449 Chronic obstructive pulmonary disease, unspecified: Secondary | ICD-10-CM | POA: Diagnosis not present

## 2020-12-23 DIAGNOSIS — I10 Essential (primary) hypertension: Secondary | ICD-10-CM | POA: Diagnosis not present

## 2021-01-05 NOTE — Telephone Encounter (Signed)
Patient is following up requesting an update on patient assistance paperwork.

## 2021-01-05 NOTE — Telephone Encounter (Signed)
**Note De-Identified  Obfuscation** I called [employer] and was advised by Renae Fickle that they have not received anything on this pt since 05/2020 and that they need his out of pocket expense report for this year (2022) showing that the pt has paid $261.72.

## 2021-01-12 NOTE — Telephone Encounter (Signed)
Lynn,LPN, please advise, thanks

## 2021-01-12 NOTE — Telephone Encounter (Signed)
Patient states he hates to keep bothering [place] but needs more samples of the eliquis. He states by Tuesday or Wednesday he will be completely out.

## 2021-01-12 NOTE — Telephone Encounter (Signed)
Mitchell [name] - do you know where pt is in the process of pt assistance? If determination is still pending, ok to provide pt with another week of samples.

## 2021-01-13 ENCOUNTER — Ambulatory Visit (INDEPENDENT_AMBULATORY_CARE_PROVIDER_SITE_OTHER): Payer: Medicare PPO | Admitting: Pulmonary Disease

## 2021-01-13 ENCOUNTER — Telehealth: Payer: Self-pay | Admitting: Interventional Cardiology

## 2021-01-13 ENCOUNTER — Encounter: Payer: Self-pay | Admitting: Pulmonary Disease

## 2021-01-13 ENCOUNTER — Other Ambulatory Visit: Payer: Self-pay

## 2021-01-13 VITALS — BP 116/72 | HR 62 | Ht 72.0 in | Wt 240.6 lb

## 2021-01-13 DIAGNOSIS — J449 Chronic obstructive pulmonary disease, unspecified: Secondary | ICD-10-CM

## 2021-01-13 MED ORDER — TRELEGY ELLIPTA 100-62.5-25 MCG/INH IN AEPB: 1.0000 | INHALATION_SPRAY | Freq: Every day | RESPIRATORY_TRACT | 0 refills | Status: DC

## 2021-01-13 NOTE — Telephone Encounter (Signed)
Patient states he was returning  a nurse cal. Please advise

## 2021-01-13 NOTE — Progress Notes (Signed)
Patient seen in the office today and instructed on use of Trelegy .  Patient expressed understanding and demonstrated technique.  Mitchell [name] [facility] 01/13/2021

## 2021-01-13 NOTE — Patient Instructions (Addendum)
Start Trelegy Ellipta inhaler 1 puff daily - rinse mouth out  Hold your dulera inhaler (blue inhaler)  while using the Trelegy Inhaler  Let us know if the Trelegy Ellipta inhaler is working better for you.   Continue to use albuterol inhaler as needed.   Talk with [employer] about how much a nebulizer machine will cost or if it is completely covered by your insurance.

## 2021-01-13 NOTE — Telephone Encounter (Signed)
**Note De-Identified  Obfuscation** The pt is advised that per [employer] he has not met his out of pocket requirement of $261.72 for this year.  He is advised that if he has met that amount to bring his oop expense report from his pharmacy to [place] so we can fax to [facility] and we will provide him enough samples to last until a determination has been made by [employer].  He is aware that if he has not met his oop requirement he will need to purchase his Eliquis which will help him meet his oop requirement.  I have also advised him to call [employer] in the future to check the progress of his application.  He verbalized understanding.

## 2021-01-13 NOTE — Telephone Encounter (Signed)
**Note De-Identified  Obfuscation** No answer and no way to leave a message. I will continue to call.

## 2021-01-13 NOTE — Progress Notes (Signed)
Synopsis: Referred in September 2022 for COPD by Evangeline Gula, PA  Subjective:   PATIENT ID: Mitchell [name] GENDER: male DOB: 1940/12/28, MRN: [id number]  HPI  Chief Complaint  Patient presents with   Consult    Referred by ED for frequent COPD exacerbation.    Mitchell [name] is an 80 year old male, former smoker with CAD, CKD stage III, GERD, hypertension, chronic hypoxemic respiratory failure due to emphysema who is referred to pulmonary clinic for evaluation of his COPD.  He had an ER visit 12/13/20 for dyspnea and was treated for COPD exacerbation. He has been doing ok since last month but continues to have bad days with his breathing throughout the week with increased cough, wheezing and dyspnea.   He is using dulera 100-52mcg 2 puffs twice daily and as needed albuterol throughout the day. He has limited eyesight and reports some difficulty using his inhalers.   He is a former smoker, smoked a pipe from [place] to 2013. He also reports a significant amount of second hand smoke throughout his life. He worked as a Naval architect.   He is on 2.5L of supplemental oxygen at home which he sues throughout the day and night.    Past Medical History:  Diagnosis Date   Anginal pain (HCC) 01/2012   Anxiety    Atherosclerotic heart disease of native coronary artery without angina pectoris    BPH (benign prostatic hypertrophy)    CAD (coronary artery disease) 2007   with LAD/Diag CBPTCA and kissing    CKD (chronic kidney disease), stage III (HCC)    Hattie Perch 01/04/2017   COPD (chronic obstructive pulmonary disease) (HCC)    "maybe" (02/14/2012)   Coronary artery disease    Depression    GERD (gastroesophageal reflux disease)    Hyperlipidemia    Hypertension    Hypoxemia    Myocardial infarction (HCC) 01/22/2012   NSTEMI with DES to RSA (PDA)   On home oxygen therapy    Shortness of breath    "walking and laying down sometimes" (02/14/2012)     Family History  Problem Relation Age  of Onset   Heart failure Mother    Heart failure Brother      Social History   Socioeconomic History   Marital status: Single    Spouse name: Not on file   Number of children: 3   Years of education: Not on file   Highest education level: Not on file  Occupational History   Not on file  Tobacco Use   Smoking status: Former    Types: Pipe    Quit date: 04/28/2011    Years since quitting: 9.7   Smokeless tobacco: Never  Vaping Use   Vaping Use: Never used  Substance and Sexual Activity   Alcohol use: Yes    Alcohol/week: 7.0 standard drinks    Types: 7 Standard drinks or equivalent per week    Comment: 02/14/2012 "weekends I have 6-7 mixed drinks"     occasional   Drug use: No   Sexual activity: Yes  Other Topics Concern   Not on file  Social History Narrative   Truck driver      Social Determinants of Health   Financial Resource Strain: Not on file  Food Insecurity: Not on file  Transportation Needs: Not on file  Physical Activity: Not on file  Stress: Not on file  Social Connections: Not on file  Intimate Partner Violence: Not on file  Allergies  Allergen Reactions   Lipitor [Atorvastatin] Other (See Comments)    "just paralyzed me; I can't take it"   Statins Other (See Comments)    "paralyzed me; I can't take it"   Rosuvastatin     Leg aches     Outpatient Medications Prior to Visit  Medication Sig Dispense Refill   allopurinol (ZYLOPRIM) 100 MG tablet Take 100 mg by mouth daily.     BISACODYL PO Take 1 tablet by mouth daily as needed.     diclofenac (VOLTAREN) 75 MG EC tablet 75 mg. Take 2 tablets at the onset of gout then 1 tablet twice daily as needed.     ELIQUIS 5 MG TABS tablet TAKE 1 TABLET (5 MG TOTAL) BY MOUTH 2 (TWO) TIMES DAILY. NEED ANNUAL MD APPT FOR REFILLS 60 tablet 3   finasteride (PROSCAR) 5 MG tablet Take 5 mg by mouth daily. Reported on 05/04/2015     furosemide (LASIX) 40 MG tablet Take 40 mg by mouth daily.      metoprolol  tartrate (LOPRESSOR) 25 MG tablet TAKE 1 TABLET TWICE DAILY 180 tablet 1   mometasone-formoterol (DULERA) 100-5 MCG/ACT AERO Inhale 2 puffs into the lungs 2 (two) times daily. 1 Inhaler 6   nitroGLYCERIN (NITROSTAT) 0.4 MG SL tablet Place 1 tablet (0.4 mg total) under the tongue every 5 (five) minutes as needed. Reported on 05/04/2015 25 tablet 3   potassium chloride (KLOR-CON 10) 10 MEQ tablet Take 1 tablet (10 mEq total) by mouth daily. 90 tablet 3   pravastatin (PRAVACHOL) 20 MG tablet TAKE 1/2 TABLET EVERY EVENING. Please keep upcoming appt in April 2022 with Dr. Katrinka Blazing for future refills. Thank you 30 tablet 3   QUEtiapine (SEROQUEL) 50 MG tablet Take 50 mg by mouth at bedtime.     tamsulosin (FLOMAX) 0.4 MG CAPS capsule Take 0.4 mg by mouth daily after breakfast. Reported on 05/04/2015     acetaminophen (ACETAMINOPHEN EXTRA STRENGTH) 500 MG tablet Take 500 mg by mouth every 6 (six) hours as needed.     albuterol (PROVENTIL HFA;VENTOLIN HFA) 108 (90 BASE) MCG/ACT inhaler Inhale 2 puffs into the lungs every 6 (six) hours as needed for wheezing. Reported on 05/04/2015     aspirin EC 81 MG tablet Take 1 tablet (81 mg total) by mouth daily. 90 tablet 3   HYDROcodone-acetaminophen (NORCO) 5-325 MG tablet Take 1 tablet by mouth every 8 (eight) hours as needed for severe pain. 20 tablet 0   Multiple Vitamins-Minerals (EYE VITAMINS & MINERALS PO) Take 1 tablet by mouth daily.     potassium chloride (KLOR-CON) 10 MEQ tablet Take by mouth.     No facility-administered medications prior to visit.    Review of Systems  Constitutional:  Negative for chills, fever, malaise/fatigue and weight loss.  HENT:  Positive for hearing loss. Negative for congestion, sinus pain and sore throat.   Eyes: Negative.   Respiratory:  Positive for cough, sputum production, shortness of breath and wheezing. Negative for hemoptysis.   Cardiovascular:  Negative for chest pain, palpitations, orthopnea, claudication and leg  swelling.  Gastrointestinal:  Negative for abdominal pain, heartburn, nausea and vomiting.  Genitourinary: Negative.   Musculoskeletal:  Negative for joint pain and myalgias.  Skin:  Negative for rash.  Neurological:  Negative for weakness.  Endo/Heme/Allergies: Negative.   Psychiatric/Behavioral: Negative.     Objective:   Vitals:   01/13/21 1001  BP: 116/72  Pulse: 62  SpO2: 95%  Weight: 240 lb  9.6 oz (109.1 kg)  Height: 6' (1.829 m)     Physical Exam Constitutional:      General: He is not in acute distress. HENT:     Head: Normocephalic and atraumatic.  Eyes:     Extraocular Movements: Extraocular movements intact.     Conjunctiva/sclera: Conjunctivae normal.     Pupils: Pupils are equal, round, and reactive to light.  Cardiovascular:     Rate and Rhythm: Normal rate and regular rhythm.     Pulses: Normal pulses.     Heart sounds: Normal heart sounds. No murmur heard. Pulmonary:     Effort: Pulmonary effort is normal.     Breath sounds: Decreased air movement present. Wheezing (scattered) present.  Abdominal:     General: Bowel sounds are normal.     Palpations: Abdomen is soft.  Musculoskeletal:     Right lower leg: No edema.     Left lower leg: No edema.  Lymphadenopathy:     Cervical: No cervical adenopathy.  Skin:    General: Skin is warm and dry.  Neurological:     General: No focal deficit present.     Mental Status: He is alert.  Psychiatric:        Mood and Affect: Mood normal.        Behavior: Behavior normal.        Thought Content: Thought content normal.        Judgment: Judgment normal.    CBC    Component Value Date/Time   WBC 7.6 12/13/2020 1636   RBC 5.84 (H) 12/13/2020 1636   HGB 17.3 (H) 12/13/2020 1636   HCT 53.9 (H) 12/13/2020 1636   PLT 218 12/13/2020 1636   MCV 92.3 12/13/2020 1636   MCH 29.6 12/13/2020 1636   MCHC 32.1 12/13/2020 1636   RDW 15.9 (H) 12/13/2020 1636   LYMPHSABS 1.2 06/22/2019 1340   MONOABS 1.0 06/22/2019  1340   EOSABS 0.3 06/22/2019 1340   BASOSABS 0.1 06/22/2019 1340   Chest imaging: CXR 12/13/20 The heart and mediastinal contours are unchanged. Aortic calcification. No focal consolidation. No pulmonary edema. No pleural effusion. No pneumothorax. No acute osseous abnormality.  CT Chest 2018 Hyperinflation of the lungs consistent with COPD.   Minimal peribronchial airspace opacities in bilateral lower lobes may represent atelectasis versus peribronchial airspace consolidation.   PFT: No flowsheet data found. 2016: FEV1/FVC 58, FEV1 38%, FVC 50%  Echo 12/2016: LVEF 55-60%. Grade I diastolic dysfunction. RV size and function is normal.   Assessment & Plan:   Chronic obstructive pulmonary disease, unspecified COPD type (HCC)  Discussion: Mitchell [name] is an 80 year old male, former smoker with CAD, CKD stage III, GERD, hypertension, chronic hypoxemic respiratory failure due to emphysema who is referred to pulmonary clinic for evaluation of his COPD.  He has severe obstructive defect based on pulmonary function tests from 2016 as well as suggested restriction by spirometry.  He is currently on ICS/LABA therapy with increasing dyspnea and high frequency of cough, wheezing and dyspnea.  We will send him home on a trial of Trelegy Ellipta 1 puff daily and he is to notify the clinic if he notices improvement in his respiratory symptoms.  Given that he has recently purchased a what sounds like a 38-month supply of Dulera he would like to finish his supply of inhalers due to financial reasons prior to switching to triple therapy with either Trelegy or nebulizer treatments.  He is to continue use of supplemental oxygen  throughout the day and night.  We will consider repeat pulmonary function testing at his follow-up visits if possible.  Follow-up in 3 months.  Melody Comas, MD [facility] Office: [phone]   Current Outpatient Medications:     allopurinol (ZYLOPRIM) 100 MG tablet, Take 100 mg by mouth daily., Disp: , Rfl:    BISACODYL PO, Take 1 tablet by mouth daily as needed., Disp: , Rfl:    diclofenac (VOLTAREN) 75 MG EC tablet, 75 mg. Take 2 tablets at the onset of gout then 1 tablet twice daily as needed., Disp: , Rfl:    ELIQUIS 5 MG TABS tablet, TAKE 1 TABLET (5 MG TOTAL) BY MOUTH 2 (TWO) TIMES DAILY. NEED ANNUAL MD APPT FOR REFILLS, Disp: 60 tablet, Rfl: 3   finasteride (PROSCAR) 5 MG tablet, Take 5 mg by mouth daily. Reported on 05/04/2015, Disp: , Rfl:    Fluticasone-Umeclidin-Vilant (TRELEGY ELLIPTA) 100-62.5-25 MCG/INH AEPB, Inhale 1 puff into the lungs daily., Disp: 2 each, Rfl: 0   furosemide (LASIX) 40 MG tablet, Take 40 mg by mouth daily. , Disp: , Rfl:    metoprolol tartrate (LOPRESSOR) 25 MG tablet, TAKE 1 TABLET TWICE DAILY, Disp: 180 tablet, Rfl: 1   mometasone-formoterol (DULERA) 100-5 MCG/ACT AERO, Inhale 2 puffs into the lungs 2 (two) times daily., Disp: 1 Inhaler, Rfl: 6   nitroGLYCERIN (NITROSTAT) 0.4 MG SL tablet, Place 1 tablet (0.4 mg total) under the tongue every 5 (five) minutes as needed. Reported on 05/04/2015, Disp: 25 tablet, Rfl: 3   potassium chloride (KLOR-CON 10) 10 MEQ tablet, Take 1 tablet (10 mEq total) by mouth daily., Disp: 90 tablet, Rfl: 3   pravastatin (PRAVACHOL) 20 MG tablet, TAKE 1/2 TABLET EVERY EVENING. Please keep upcoming appt in April 2022 with Dr. Katrinka Blazing for future refills. Thank you, Disp: 30 tablet, Rfl: 3   QUEtiapine (SEROQUEL) 50 MG tablet, Take 50 mg by mouth at bedtime., Disp: , Rfl:    acetaminophen (ACETAMINOPHEN EXTRA STRENGTH) 500 MG tablet, Take 500 mg by mouth every 6 (six) hours as needed., Disp: , Rfl:    albuterol (PROVENTIL HFA;VENTOLIN HFA) 108 (90 BASE) MCG/ACT inhaler, Inhale 2 puffs into the lungs every 6 (six) hours as needed for wheezing. Reported on 05/04/2015, Disp: , Rfl:

## 2021-01-13 NOTE — Telephone Encounter (Signed)
**Note De-Identified  Obfuscation** See phone note from 12/20/20 for update.

## 2021-01-22 DIAGNOSIS — I10 Essential (primary) hypertension: Secondary | ICD-10-CM | POA: Diagnosis not present

## 2021-01-22 DIAGNOSIS — I509 Heart failure, unspecified: Secondary | ICD-10-CM | POA: Diagnosis not present

## 2021-01-22 DIAGNOSIS — J449 Chronic obstructive pulmonary disease, unspecified: Secondary | ICD-10-CM | POA: Diagnosis not present

## 2021-01-25 NOTE — Progress Notes (Signed)
Cardiology Office Note:    Date:  01/27/2021   ID:  CANDICE LUNNEY, DOB [birth year], MRN [id number]  PCP:  Daisy Floro, MD  Cardiologist:  Lesleigh Noe, MD   Referring MD: Daisy Floro, MD   Chief Complaint  Patient presents with   Follow-up    Atrial flutter CAD COPD Diastolic heart failure    History of Present Illness:    Mitchell [name] is a 80 y.o. male with a hx of  coronary artery disease with remote LAD/Diagonal cutting balloon PCI, history of RCA?PDA DES, COPD, paroxysmal atrial flutter, chronic apixaban therapy, diastolic heart failure, claudication and hypertension. COPD has progressed to requirement for home oxygen.   Mitchell [name] is concerned about his financial viability relative to the cost of his medications.  He is not having angina.  He may have mild orthopnea.  There is no lower extremity edema.  He has not needed to use nitroglycerin.  His major concern and complaint is limitations in physical activity due to dyspnea.  He is on chronic oxygen therapy although he does not have it with him today.  He now has a pulmonologist, Melody Comas.  Past Medical History:  Diagnosis Date   Anginal pain (HCC) 01/2012   Anxiety    Atherosclerotic heart disease of native coronary artery without angina pectoris    BPH (benign prostatic hypertrophy)    CAD (coronary artery disease) 2007   with LAD/Diag CBPTCA and kissing    CKD (chronic kidney disease), stage III (HCC)    Hattie Perch 01/04/2017   COPD (chronic obstructive pulmonary disease) (HCC)    "maybe" (02/14/2012)   Coronary artery disease    Depression    GERD (gastroesophageal reflux disease)    Hyperlipidemia    Hypertension    Hypoxemia    Myocardial infarction (HCC) 01/22/2012   NSTEMI with DES to RSA (PDA)   On home oxygen therapy    Shortness of breath    "walking and laying down sometimes" (02/14/2012)    Past Surgical History:  Procedure Laterality Date   CATARACT EXTRACTION W/  INTRAOCULAR LENS  IMPLANT, BILATERAL  ~ 2010   CORONARY ANGIOPLASTY WITH STENT PLACEMENT  05/11/2005   PCI OF LAD   GANGLION CYST REMOVED  1956?   right   LEFT HEART CATHETERIZATION WITH CORONARY ANGIOGRAM Bilateral 01/24/2012   Procedure: LEFT HEART CATHETERIZATION WITH CORONARY ANGIOGRAM;  Surgeon: Lesleigh Noe, MD;  Location: [facility] CATH LAB;  Service: Cardiovascular;  Laterality: Bilateral;   REFRACTIVE SURGERY  2010   left    Current Medications: Current Meds  Medication Sig   acetaminophen (TYLENOL) 500 MG tablet Take 500 mg by mouth every 6 (six) hours as needed.   albuterol (PROVENTIL HFA;VENTOLIN HFA) 108 (90 BASE) MCG/ACT inhaler Inhale 2 puffs into the lungs every 6 (six) hours as needed for wheezing. Reported on 05/04/2015   allopurinol (ZYLOPRIM) 100 MG tablet Take 100 mg by mouth daily.   BISACODYL PO Take 1 tablet by mouth daily as needed.   diclofenac (VOLTAREN) 75 MG EC tablet 75 mg. Take 2 tablets at the onset of gout then 1 tablet twice daily as needed.   ELIQUIS 5 MG TABS tablet TAKE 1 TABLET (5 MG TOTAL) BY MOUTH 2 (TWO) TIMES DAILY. NEED ANNUAL MD APPT FOR REFILLS   finasteride (PROSCAR) 5 MG tablet Take 5 mg by mouth daily. Reported on 05/04/2015   Fluticasone-Umeclidin-Vilant (TRELEGY ELLIPTA) 100-62.5-25 MCG/INH AEPB Inhale 1 puff into the  lungs daily.   furosemide (LASIX) 40 MG tablet Take 40 mg by mouth daily.    metoprolol tartrate (LOPRESSOR) 25 MG tablet TAKE 1 TABLET TWICE DAILY   mometasone-formoterol (DULERA) 100-5 MCG/ACT AERO Inhale 2 puffs into the lungs 2 (two) times daily.   nitroGLYCERIN (NITROSTAT) 0.4 MG SL tablet Place 1 tablet (0.4 mg total) under the tongue every 5 (five) minutes as needed. Reported on 05/04/2015   potassium chloride (KLOR-CON 10) 10 MEQ tablet Take 1 tablet (10 mEq total) by mouth daily.   pravastatin (PRAVACHOL) 20 MG tablet TAKE 1/2 TABLET EVERY EVENING. Please keep upcoming appt in April 2022 with Dr. Katrinka Blazing for future refills.  Thank you   QUEtiapine (SEROQUEL) 50 MG tablet Take 50 mg by mouth at bedtime.   tamsulosin (FLOMAX) 0.4 MG CAPS capsule Take 0.4 mg by mouth daily.     Allergies:   Lipitor [atorvastatin], Statins, and Rosuvastatin   Social History   Socioeconomic History   Marital status: Single    Spouse name: Not on file   Number of children: 3   Years of education: Not on file   Highest education level: Not on file  Occupational History   Not on file  Tobacco Use   Smoking status: Former    Types: Pipe    Quit date: 04/28/2011    Years since quitting: 9.7   Smokeless tobacco: Never  Vaping Use   Vaping Use: Never used  Substance and Sexual Activity   Alcohol use: Yes    Alcohol/week: 7.0 standard drinks    Types: 7 Standard drinks or equivalent per week    Comment: 02/14/2012 "weekends I have 6-7 mixed drinks"     occasional   Drug use: No   Sexual activity: Yes  Other Topics Concern   Not on file  Social History Narrative   Truck driver      Social Determinants of Health   Financial Resource Strain: Not on file  Food Insecurity: Not on file  Transportation Needs: Not on file  Physical Activity: Not on file  Stress: Not on file  Social Connections: Not on file     Family History: The patient's family history includes Heart failure in his brother and mother.  ROS:   Please see the history of present illness.    Osteoarthritis right knee.  Mourning the death of his son which occurred on July 5.  He has 2 living sons.  He wants to travel some.  He feels he is coming close to the end of his life.  2 of his very dear friends have passed away.  He is making preparations at home to be able to stay there as long as he can: Theora Master is now the living room; he has a walker; he has a potty seat.  All other systems reviewed and are negative.  EKGs/Labs/Other Studies Reviewed:    The following studies were reviewed today: No new or recent cardiac imaging  EKG:  EKG sinus bradycardia,  first-degree AV block 208 ms, and when compared to prior the heart rate is somewhat slower today, first-degree AV block is new, otherwise no change  Recent Labs: 12/13/2020: B Natriuretic Peptide 21.3; BUN 26; Creatinine, Ser 1.56; Hemoglobin 17.3; Platelets 218; Potassium 3.5; Sodium 140  Recent Lipid Panel    Component Value Date/Time   CHOL 167 02/05/2018 0744   TRIG 83 02/05/2018 0744   HDL 54 02/05/2018 0744   CHOLHDL 3.1 02/05/2018 0744   CHOLHDL 4.9 01/12/2017  0503   VLDL 21 01/12/2017 0503   LDLCALC 96 02/05/2018 0744    Physical Exam:    VS:  BP 118/74   Pulse (!) 56   Ht 6' (1.829 m)   Wt 235 lb 3.2 oz (106.7 kg)   SpO2 95%   BMI 31.90 kg/m     Wt Readings from Last 3 Encounters:  01/27/21 235 lb 3.2 oz (106.7 kg)  01/13/21 240 lb 9.6 oz (109.1 kg)  06/22/19 199 lb (90.3 kg)     GEN: Lower extremities demonstrate somewhat cyanotic appearance.  Lichenification of skin is noted.. No acute distress HEENT: Normal NECK: No JVD. LYMPHATICS: No lymphadenopathy CARDIAC: No murmur. RRR no gallop, or edema. VASCULAR:  Normal Pulses. No bruits. RESPIRATORY: Distant breath sounds.  No rales or rhonchi.  No wheezing.   ABDOMEN: Soft, non-tender, non-distended, No pulsatile mass, MUSCULOSKELETAL: No deformity  SKIN: Warm and dry NEUROLOGIC:  Alert and oriented x 3 PSYCHIATRIC:  Normal affect   ASSESSMENT:    1. Atypical atrial flutter (HCC)   2. Coronary artery disease involving native coronary artery of native heart with angina pectoris (HCC)   3. Essential hypertension   4. Mixed hyperlipidemia   5. Chronic obstructive pulmonary disease, unspecified COPD type (HCC)    PLAN:    In order of problems listed above:  Not in a flutter based on today's EKG.  It is appropriate to continue anticoagulation therapy to prophylax against unrecognized atrial fibrillation and atrial flutter. Secondary prevention discussed.  He is asymptomatic.   Continue furosemide,  metoprolol, and try to limit Voltaren use.  Excellent blood pressure control. Currently on Pravastatin therapy.  Most recent LDL was 75. This is his most pressing chronic cardiac condition.  He now has pulmonologist, Dr. Francine Graven.   Overall education and awareness concerning primary/secondary risk prevention was discussed in detail: LDL less than 70, hemoglobin A1c less than 7, blood pressure target less than 130/80 mmHg, >150 minutes of moderate aerobic activity per week, avoidance of smoking, weight control (via diet and exercise), and continued surveillance/management of/for obstructive sleep apnea.   Clinical follow-up in 1 year.   Medication Adjustments/Labs and Tests Ordered: Current medicines are reviewed at length with the patient today.  Concerns regarding medicines are outlined above.  Orders Placed This Encounter  Procedures   EKG 12-Lead   No orders of the defined types were placed in this encounter.   Patient Instructions  Medication Instructions:  Your physician recommends that you continue on your current medications as directed. Please refer to the Current Medication list given to you today.  *If you need a refill on your cardiac medications before your next appointment, please call your pharmacy*   Lab Work: None If you have labs (blood work) drawn today and your tests are completely normal, you will receive your results only by: MyChart Message (if you have MyChart) OR A paper copy in the mail If you have any lab test that is abnormal or we need to change your treatment, we will call you to review the results.   Testing/Procedures: None   Follow-Up: At [facility], you and your health needs are our priority.  As part of our continuing mission to provide you with exceptional heart care, we have created designated Provider Care Teams.  These Care Teams include your primary Cardiologist (physician) and Advanced Practice Providers (APPs -  Physician Assistants  and Nurse Practitioners) who all work together to provide you with the care you need, when  you need it.  We recommend signing up for the patient portal called "MyChart".  Sign up information is provided on this After Visit Summary.  MyChart is used to connect with patients for Virtual Visits (Telemedicine).  Patients are able to view lab/test results, encounter notes, upcoming appointments, etc.  Non-urgent messages can be sent to your provider as well.   To learn more about what you can do with MyChart, go to [email or web address].    Your next appointment:   1 year(s)  The format for your next appointment:   In Person  Provider:   You may see Lesleigh Noe, MD or one of the following Advanced Practice Providers on your designated Care Team:   Nada Boozer, NP   Other Instructions     Signed, Lesleigh Noe, MD  01/27/2021 11:11 AM    [facility]

## 2021-01-27 ENCOUNTER — Other Ambulatory Visit: Payer: Self-pay

## 2021-01-27 ENCOUNTER — Encounter: Payer: Self-pay | Admitting: Interventional Cardiology

## 2021-01-27 ENCOUNTER — Other Ambulatory Visit: Payer: Medicare PPO

## 2021-01-27 ENCOUNTER — Ambulatory Visit (INDEPENDENT_AMBULATORY_CARE_PROVIDER_SITE_OTHER): Payer: Medicare PPO | Admitting: Interventional Cardiology

## 2021-01-27 VITALS — BP 118/74 | HR 56 | Ht 72.0 in | Wt 235.2 lb

## 2021-01-27 DIAGNOSIS — E782 Mixed hyperlipidemia: Secondary | ICD-10-CM | POA: Diagnosis not present

## 2021-01-27 DIAGNOSIS — J449 Chronic obstructive pulmonary disease, unspecified: Secondary | ICD-10-CM | POA: Diagnosis not present

## 2021-01-27 DIAGNOSIS — I484 Atypical atrial flutter: Secondary | ICD-10-CM

## 2021-01-27 DIAGNOSIS — I10 Essential (primary) hypertension: Secondary | ICD-10-CM

## 2021-01-27 DIAGNOSIS — I25119 Atherosclerotic heart disease of native coronary artery with unspecified angina pectoris: Secondary | ICD-10-CM

## 2021-01-27 NOTE — Patient Instructions (Signed)

## 2021-01-30 ENCOUNTER — Telehealth: Payer: Self-pay | Admitting: Interventional Cardiology

## 2021-01-30 ENCOUNTER — Other Ambulatory Visit: Payer: Medicare PPO

## 2021-01-30 ENCOUNTER — Other Ambulatory Visit: Payer: Self-pay

## 2021-01-30 DIAGNOSIS — I484 Atypical atrial flutter: Secondary | ICD-10-CM | POA: Diagnosis not present

## 2021-01-30 LAB — CBC
Hematocrit: 46.1 % (ref 37.5–51.0)
Hemoglobin: 15.9 g/dL (ref 13.0–17.7)
MCH: 30 pg (ref 26.6–33.0)
MCHC: 34.5 g/dL (ref 31.5–35.7)
MCV: 87 fL (ref 79–97)
Platelets: 176 10*3/uL (ref 150–450)
RBC: 5.3 x10E6/uL (ref 4.14–5.80)
RDW: 12.9 % (ref 11.6–15.4)
WBC: 8.7 10*3/uL (ref 3.4–10.8)

## 2021-01-30 LAB — BASIC METABOLIC PANEL
BUN/Creatinine Ratio: 14 (ref 10–24)
BUN: 20 mg/dL (ref 8–27)
CO2: 27 mmol/L (ref 20–29)
Calcium: 9.2 mg/dL (ref 8.6–10.2)
Chloride: 102 mmol/L (ref 96–106)
Creatinine, Ser: 1.39 mg/dL — ABNORMAL HIGH (ref 0.76–1.27)
Glucose: 93 mg/dL (ref 70–99)
Potassium: 4.5 mmol/L (ref 3.5–5.2)
Sodium: 139 mmol/L (ref 134–144)
eGFR: 51 mL/min/{1.73_m2} — ABNORMAL LOW (ref >59)

## 2021-01-30 NOTE — Telephone Encounter (Signed)
Spoke with caretaker and made her aware that pt is not to be splitting the Eliquis.  Advised when I spoke with him the other day I told him he needed to come back for labs because we need to make sure his kidney numbers came back down, otherwise we may have to adjust his dose.  She states she has already split them.  Advised he needs to go back to taking a full tablet BID until told otherwise.  She verbalized understanding.

## 2021-01-30 NOTE — Telephone Encounter (Signed)
Pt c/o medication issue:  1. Name of Medication: ELIQUIS 5 MG TABS tablet  2. How are you currently taking this medication (dosage and times per day)? Half a tablet twice a day  3. Are you having a reaction (difficulty breathing--STAT)? No  4. What is your medication issue? Patient's caretaker states a nurse told the patient to split the tablets in half. She would like to clarify if this is how he should now be taking the eliquis, because she already started it.

## 2021-02-06 DIAGNOSIS — Z0000 Encounter for general adult medical examination without abnormal findings: Secondary | ICD-10-CM | POA: Diagnosis not present

## 2021-02-22 DIAGNOSIS — I10 Essential (primary) hypertension: Secondary | ICD-10-CM | POA: Diagnosis not present

## 2021-02-22 DIAGNOSIS — I509 Heart failure, unspecified: Secondary | ICD-10-CM | POA: Diagnosis not present

## 2021-02-22 DIAGNOSIS — J449 Chronic obstructive pulmonary disease, unspecified: Secondary | ICD-10-CM | POA: Diagnosis not present

## 2021-02-24 ENCOUNTER — Other Ambulatory Visit: Payer: Self-pay | Admitting: Interventional Cardiology

## 2021-03-13 ENCOUNTER — Telehealth: Payer: Self-pay

## 2021-03-13 NOTE — Telephone Encounter (Signed)
**Note De-Identified  Obfuscation** The pts completed [employer]PA[employer] application was left at the office without documents except hand written letter of household bill amount.  I have completed the providers page of his application and have e-mailed all to Dr Michaelle Copas nurse so she can obtain his signature, date it, and to fax all to [facility] at the fax number written on the cover letter included of to place in the to be faxed basket in Medical Records to be faxed.

## 2021-03-13 NOTE — Telephone Encounter (Signed)
Paperwork completed and faxed.  °

## 2021-03-20 DIAGNOSIS — J441 Chronic obstructive pulmonary disease with (acute) exacerbation: Secondary | ICD-10-CM | POA: Diagnosis not present

## 2021-03-24 DIAGNOSIS — I10 Essential (primary) hypertension: Secondary | ICD-10-CM | POA: Diagnosis not present

## 2021-03-24 DIAGNOSIS — I509 Heart failure, unspecified: Secondary | ICD-10-CM | POA: Diagnosis not present

## 2021-03-24 DIAGNOSIS — J449 Chronic obstructive pulmonary disease, unspecified: Secondary | ICD-10-CM | POA: Diagnosis not present

## 2021-04-13 ENCOUNTER — Other Ambulatory Visit: Payer: Self-pay | Admitting: Interventional Cardiology

## 2021-04-13 NOTE — Telephone Encounter (Signed)
Prescription refill request for Eliquis received. Indication: (Aflutter)  Last office visit: 01/27/21 Katrinka Blazing)  Scr: 1.39 (01/30/21)  Age: 80 Weight: 106.7kg  Appropriate dose and refill sent to requested pharmacy.

## 2021-04-18 DIAGNOSIS — D485 Neoplasm of uncertain behavior of skin: Secondary | ICD-10-CM | POA: Diagnosis not present

## 2021-04-18 DIAGNOSIS — M179 Osteoarthritis of knee, unspecified: Secondary | ICD-10-CM | POA: Diagnosis not present

## 2021-04-18 DIAGNOSIS — J441 Chronic obstructive pulmonary disease with (acute) exacerbation: Secondary | ICD-10-CM | POA: Diagnosis not present

## 2021-04-24 DIAGNOSIS — J449 Chronic obstructive pulmonary disease, unspecified: Secondary | ICD-10-CM | POA: Diagnosis not present

## 2021-04-24 DIAGNOSIS — I509 Heart failure, unspecified: Secondary | ICD-10-CM | POA: Diagnosis not present

## 2021-04-24 DIAGNOSIS — I10 Essential (primary) hypertension: Secondary | ICD-10-CM | POA: Diagnosis not present

## 2021-05-01 ENCOUNTER — Other Ambulatory Visit: Payer: Self-pay

## 2021-05-01 ENCOUNTER — Inpatient Hospital Stay (HOSPITAL_COMMUNITY)
Admission: EM | Admit: 2021-05-01 | Discharge: 2021-05-05 | DRG: 191 | Disposition: A | Discharge status: Home / Self Care | Payer: Medicare PPO | Attending: Family Medicine | Admitting: Family Medicine

## 2021-05-01 ENCOUNTER — Encounter (HOSPITAL_COMMUNITY): Payer: Self-pay

## 2021-05-01 ENCOUNTER — Emergency Department (HOSPITAL_COMMUNITY): Payer: Medicare PPO

## 2021-05-01 DIAGNOSIS — I130 Hypertensive heart and chronic kidney disease with heart failure and stage 1 through stage 4 chronic kidney disease, or unspecified chronic kidney disease: Secondary | ICD-10-CM | POA: Diagnosis present

## 2021-05-01 DIAGNOSIS — Z888 Allergy status to other drugs, medicaments and biological substances status: Secondary | ICD-10-CM

## 2021-05-01 DIAGNOSIS — Z8659 Personal history of other mental and behavioral disorders: Secondary | ICD-10-CM

## 2021-05-01 DIAGNOSIS — I484 Atypical atrial flutter: Secondary | ICD-10-CM | POA: Diagnosis present

## 2021-05-01 DIAGNOSIS — Z7901 Long term (current) use of anticoagulants: Secondary | ICD-10-CM

## 2021-05-01 DIAGNOSIS — Z9841 Cataract extraction status, right eye: Secondary | ICD-10-CM

## 2021-05-01 DIAGNOSIS — N183 Chronic kidney disease, stage 3 unspecified: Secondary | ICD-10-CM | POA: Diagnosis present

## 2021-05-01 DIAGNOSIS — N1831 Chronic kidney disease, stage 3a: Secondary | ICD-10-CM | POA: Diagnosis present

## 2021-05-01 DIAGNOSIS — Z9842 Cataract extraction status, left eye: Secondary | ICD-10-CM

## 2021-05-01 DIAGNOSIS — M109 Gout, unspecified: Secondary | ICD-10-CM | POA: Diagnosis present

## 2021-05-01 DIAGNOSIS — Z961 Presence of intraocular lens: Secondary | ICD-10-CM | POA: Diagnosis present

## 2021-05-01 DIAGNOSIS — J9611 Chronic respiratory failure with hypoxia: Secondary | ICD-10-CM | POA: Diagnosis present

## 2021-05-01 DIAGNOSIS — I5032 Chronic diastolic (congestive) heart failure: Secondary | ICD-10-CM | POA: Diagnosis present

## 2021-05-01 DIAGNOSIS — K8020 Calculus of gallbladder without cholecystitis without obstruction: Secondary | ICD-10-CM | POA: Diagnosis not present

## 2021-05-01 DIAGNOSIS — Z87891 Personal history of nicotine dependence: Secondary | ICD-10-CM | POA: Diagnosis not present

## 2021-05-01 DIAGNOSIS — F5105 Insomnia due to other mental disorder: Secondary | ICD-10-CM | POA: Diagnosis present

## 2021-05-01 DIAGNOSIS — I10 Essential (primary) hypertension: Secondary | ICD-10-CM | POA: Diagnosis not present

## 2021-05-01 DIAGNOSIS — J441 Chronic obstructive pulmonary disease with (acute) exacerbation: Secondary | ICD-10-CM | POA: Diagnosis present

## 2021-05-01 DIAGNOSIS — Z9981 Dependence on supplemental oxygen: Secondary | ICD-10-CM

## 2021-05-01 DIAGNOSIS — F419 Anxiety disorder, unspecified: Secondary | ICD-10-CM | POA: Diagnosis present

## 2021-05-01 DIAGNOSIS — N400 Enlarged prostate without lower urinary tract symptoms: Secondary | ICD-10-CM | POA: Diagnosis present

## 2021-05-01 DIAGNOSIS — I252 Old myocardial infarction: Secondary | ICD-10-CM | POA: Diagnosis not present

## 2021-05-01 DIAGNOSIS — Z20822 Contact with and (suspected) exposure to covid-19: Secondary | ICD-10-CM | POA: Diagnosis present

## 2021-05-01 DIAGNOSIS — I509 Heart failure, unspecified: Secondary | ICD-10-CM | POA: Diagnosis not present

## 2021-05-01 DIAGNOSIS — J9811 Atelectasis: Secondary | ICD-10-CM | POA: Diagnosis not present

## 2021-05-01 DIAGNOSIS — I4821 Permanent atrial fibrillation: Secondary | ICD-10-CM | POA: Diagnosis present

## 2021-05-01 DIAGNOSIS — I25119 Atherosclerotic heart disease of native coronary artery with unspecified angina pectoris: Secondary | ICD-10-CM | POA: Diagnosis present

## 2021-05-01 DIAGNOSIS — R911 Solitary pulmonary nodule: Secondary | ICD-10-CM

## 2021-05-01 DIAGNOSIS — Z8249 Family history of ischemic heart disease and other diseases of the circulatory system: Secondary | ICD-10-CM

## 2021-05-01 DIAGNOSIS — Z79899 Other long term (current) drug therapy: Secondary | ICD-10-CM | POA: Diagnosis not present

## 2021-05-01 DIAGNOSIS — J9610 Chronic respiratory failure, unspecified whether with hypoxia or hypercapnia: Secondary | ICD-10-CM | POA: Diagnosis present

## 2021-05-01 DIAGNOSIS — K219 Gastro-esophageal reflux disease without esophagitis: Secondary | ICD-10-CM | POA: Diagnosis present

## 2021-05-01 DIAGNOSIS — Z955 Presence of coronary angioplasty implant and graft: Secondary | ICD-10-CM | POA: Diagnosis not present

## 2021-05-01 DIAGNOSIS — I2510 Atherosclerotic heart disease of native coronary artery without angina pectoris: Secondary | ICD-10-CM | POA: Diagnosis present

## 2021-05-01 DIAGNOSIS — E785 Hyperlipidemia, unspecified: Secondary | ICD-10-CM | POA: Diagnosis present

## 2021-05-01 DIAGNOSIS — F32A Depression, unspecified: Secondary | ICD-10-CM | POA: Diagnosis present

## 2021-05-01 DIAGNOSIS — R079 Chest pain, unspecified: Secondary | ICD-10-CM | POA: Diagnosis not present

## 2021-05-01 DIAGNOSIS — E782 Mixed hyperlipidemia: Secondary | ICD-10-CM | POA: Diagnosis present

## 2021-05-01 DIAGNOSIS — R0602 Shortness of breath: Secondary | ICD-10-CM | POA: Diagnosis not present

## 2021-05-01 DIAGNOSIS — I700 Atherosclerosis of aorta: Secondary | ICD-10-CM | POA: Diagnosis not present

## 2021-05-01 LAB — CBC WITH DIFFERENTIAL/PLATELET
Abs Immature Granulocytes: 0.04 10*3/uL (ref 0.00–0.07)
Basophils Absolute: 0.1 10*3/uL (ref 0.0–0.1)
Basophils Relative: 1 %
Eosinophils Absolute: 0.3 10*3/uL (ref 0.0–0.5)
Eosinophils Relative: 3 %
HCT: 52.9 % — ABNORMAL HIGH (ref 39.0–52.0)
Hemoglobin: 16.9 g/dL (ref 13.0–17.0)
Immature Granulocytes: 0 %
Lymphocytes Relative: 13 %
Lymphs Abs: 1.2 10*3/uL (ref 0.7–4.0)
MCH: 29.6 pg (ref 26.0–34.0)
MCHC: 31.9 g/dL (ref 30.0–36.0)
MCV: 92.8 fL (ref 80.0–100.0)
Monocytes Absolute: 1 10*3/uL (ref 0.1–1.0)
Monocytes Relative: 11 %
Neutro Abs: 6.4 10*3/uL (ref 1.7–7.7)
Neutrophils Relative %: 72 %
Platelets: 214 10*3/uL (ref 150–400)
RBC: 5.7 MIL/uL (ref 4.22–5.81)
RDW: 14.2 % (ref 11.5–15.5)
WBC: 9 10*3/uL (ref 4.0–10.5)
nRBC: 0 % (ref 0.0–0.2)

## 2021-05-01 LAB — COMPREHENSIVE METABOLIC PANEL
ALT: 13 U/L (ref 0–44)
AST: 17 U/L (ref 15–41)
Albumin: 3.8 g/dL (ref 3.5–5.0)
Alkaline Phosphatase: 72 U/L (ref 38–126)
Anion gap: 6 (ref 5–15)
BUN: 21 mg/dL (ref 8–23)
CO2: 35 mmol/L — ABNORMAL HIGH (ref 22–32)
Calcium: 8.9 mg/dL (ref 8.9–10.3)
Chloride: 98 mmol/L (ref 98–111)
Creatinine, Ser: 1.42 mg/dL — ABNORMAL HIGH (ref 0.61–1.24)
GFR, Estimated: 50 mL/min — ABNORMAL LOW (ref >60)
Glucose, Bld: 104 mg/dL — ABNORMAL HIGH (ref 70–99)
Potassium: 4 mmol/L (ref 3.5–5.1)
Sodium: 139 mmol/L (ref 135–145)
Total Bilirubin: 0.9 mg/dL (ref 0.3–1.2)
Total Protein: 6.3 g/dL — ABNORMAL LOW (ref 6.5–8.1)

## 2021-05-01 LAB — RESP PANEL BY RT-PCR (FLU A&B, COVID) ARPGX2
Influenza A by PCR: NEGATIVE
Influenza B by PCR: NEGATIVE
SARS Coronavirus 2 by RT PCR: NEGATIVE

## 2021-05-01 LAB — BRAIN NATRIURETIC PEPTIDE: B Natriuretic Peptide: 27.3 pg/mL (ref 0.0–100.0)

## 2021-05-01 LAB — TROPONIN I (HIGH SENSITIVITY)
Troponin I (High Sensitivity): 3 ng/L (ref <18)
Troponin I (High Sensitivity): 3 ng/L (ref <18)

## 2021-05-01 MED ORDER — PRAVASTATIN SODIUM 20 MG PO TABS
20.0000 mg | ORAL_TABLET | Freq: Every day | ORAL | Status: DC
  Administered 2021-05-01 – 2021-05-04 (×4): 20 mg via ORAL
  Filled 2021-05-01 (×4): qty 1

## 2021-05-01 MED ORDER — UMECLIDINIUM BROMIDE 62.5 MCG/ACT IN AEPB
1.0000 | INHALATION_SPRAY | Freq: Every day | RESPIRATORY_TRACT | Status: DC
  Administered 2021-05-02 – 2021-05-05 (×4): 1 via RESPIRATORY_TRACT
  Filled 2021-05-01: qty 7

## 2021-05-01 MED ORDER — IPRATROPIUM-ALBUTEROL 0.5-2.5 (3) MG/3ML IN SOLN
3.0000 mL | Freq: Four times a day (QID) | RESPIRATORY_TRACT | Status: DC
  Administered 2021-05-01 – 2021-05-02 (×2): 3 mL via RESPIRATORY_TRACT
  Filled 2021-05-01 (×2): qty 3

## 2021-05-01 MED ORDER — SODIUM CHLORIDE 0.9% FLUSH
3.0000 mL | Freq: Two times a day (BID) | INTRAVENOUS | Status: DC
  Administered 2021-05-01 – 2021-05-04 (×6): 3 mL via INTRAVENOUS

## 2021-05-01 MED ORDER — FLUTICASONE-UMECLIDIN-VILANT 100-62.5-25 MCG/ACT IN AEPB: 1.0000 | INHALATION_SPRAY | Freq: Every day | RESPIRATORY_TRACT | Status: DC

## 2021-05-01 MED ORDER — ACETAMINOPHEN 325 MG PO TABS
650.0000 mg | ORAL_TABLET | Freq: Four times a day (QID) | ORAL | Status: DC | PRN
  Administered 2021-05-01 – 2021-05-03 (×4): 650 mg via ORAL
  Filled 2021-05-01 (×5): qty 2

## 2021-05-01 MED ORDER — ALLOPURINOL 100 MG PO TABS
100.0000 mg | ORAL_TABLET | Freq: Every day | ORAL | Status: DC
  Administered 2021-05-02 – 2021-05-05 (×4): 100 mg via ORAL
  Filled 2021-05-01 (×4): qty 1

## 2021-05-01 MED ORDER — ALBUTEROL SULFATE HFA 108 (90 BASE) MCG/ACT IN AERS
8.0000 | INHALATION_SPRAY | Freq: Once | RESPIRATORY_TRACT | Status: AC
  Administered 2021-05-01: 8 via RESPIRATORY_TRACT
  Filled 2021-05-01: qty 6.7

## 2021-05-01 MED ORDER — FLUTICASONE FUROATE-VILANTEROL 100-25 MCG/ACT IN AEPB
1.0000 | INHALATION_SPRAY | Freq: Every day | RESPIRATORY_TRACT | Status: DC
  Administered 2021-05-02 – 2021-05-05 (×4): 1 via RESPIRATORY_TRACT
  Filled 2021-05-01: qty 28

## 2021-05-01 MED ORDER — ACETAMINOPHEN 650 MG RE SUPP: 650.0000 mg | Freq: Four times a day (QID) | RECTAL | Status: DC | PRN

## 2021-05-01 MED ORDER — ALBUTEROL SULFATE (2.5 MG/3ML) 0.083% IN NEBU: 2.5000 mg | INHALATION_SOLUTION | RESPIRATORY_TRACT | Status: DC | PRN

## 2021-05-01 MED ORDER — FUROSEMIDE 40 MG PO TABS
40.0000 mg | ORAL_TABLET | Freq: Every day | ORAL | Status: DC
  Administered 2021-05-02 – 2021-05-05 (×4): 40 mg via ORAL
  Filled 2021-05-01 (×4): qty 1

## 2021-05-01 MED ORDER — MAGNESIUM SULFATE 2 GM/50ML IV SOLN
2.0000 g | Freq: Once | INTRAVENOUS | Status: AC
  Administered 2021-05-01: 2 g via INTRAVENOUS
  Filled 2021-05-01: qty 50

## 2021-05-01 MED ORDER — METOPROLOL TARTRATE 25 MG PO TABS
25.0000 mg | ORAL_TABLET | Freq: Two times a day (BID) | ORAL | Status: DC
  Administered 2021-05-01 – 2021-05-05 (×8): 25 mg via ORAL
  Filled 2021-05-01 (×8): qty 1

## 2021-05-01 MED ORDER — APIXABAN 5 MG PO TABS
5.0000 mg | ORAL_TABLET | Freq: Two times a day (BID) | ORAL | Status: DC
  Administered 2021-05-01 – 2021-05-05 (×8): 5 mg via ORAL
  Filled 2021-05-01 (×8): qty 1

## 2021-05-01 MED ORDER — METHYLPREDNISOLONE SODIUM SUCC 125 MG IJ SOLR
125.0000 mg | Freq: Once | INTRAMUSCULAR | Status: AC
  Administered 2021-05-01: 125 mg via INTRAVENOUS
  Filled 2021-05-01: qty 2

## 2021-05-01 MED ORDER — POLYETHYLENE GLYCOL 3350 17 G PO PACK
17.0000 g | PACK | Freq: Every day | ORAL | Status: DC | PRN
  Administered 2021-05-03: 17 g via ORAL
  Filled 2021-05-01: qty 1

## 2021-05-01 MED ORDER — PREDNISONE 20 MG PO TABS
40.0000 mg | ORAL_TABLET | Freq: Every day | ORAL | Status: DC
  Administered 2021-05-02: 40 mg via ORAL
  Filled 2021-05-01: qty 2

## 2021-05-01 NOTE — ED Triage Notes (Signed)
Pt coming from home via EMS c/o shortness of breath since Thanksgiving getting worse the last couple days and also has had some intermittent chest pain since Christmas. Pt has hx of CHF and COPD. Pt states that he only takes his lasix every day if he is not going anywhere, but if he is going somewhere he does not take it. Pt is on 3 L [place] baseline.

## 2021-05-01 NOTE — H&P (Signed)
History and Physical   KAMDIN KENON [id number] DOB: 09/22/1940 DOA: 05/01/2021  PCP: Lawerance Cruel, MD   Patient coming from: Home  Chief Complaint: Shortness of breath  HPI: Mitchell [name] is a 81 y.o. male with medical history significant of vision pain, hyperlipidemia, depression, anxiety, hypertension, CAD status post ending, COPD, CKD 3, chronic respiratory failure on home oxygen, gout, a flutter who presents with worsening shortness of breath.  Patient states he has had significant only worse shortness of breath for the past 2 days.  It felt really bad last night stating he felt like he might die.  He states he had some degree of worsening shortness of breath around Thanksgiving Thanksgiving and also reports some intermittent chest pain since Christmas.  He states his breathing did get better for a while, but got worse a couple weeks ago.  He had EMS come out and evaluate him a week and a half ago and they recommended going to the ED at that time but he initially refused.  He does have CAD with a history of stenting and he does have a history of a flutter, no history of CHF.  Does state that he takes Lasix but only when he is going to be staying at home and not going out for anything.  He is on 3 L of oxygen at his baseline.  EKG he has significant concern for his shortness of breath and is not comfortable going home.  He denies fevers, chills, abdominal pain, constipation, diarrhea, nausea, vomiting.   ED Course: Vital signs in ED significant for respirate in the teens to 41s, heart rate in the 50s to 70s, blood pressure in the 130s 150s, saturating well on his home 3 L.  Lab work-up showed CMP with bicarb 35, creatinine appears to be stable at 1.42, protein 6.3.  CBC with normal limits.  Troponin negative x2.  BNP normal.  Rest were panel flu and COVID-negative.  Chest x-ray no acute abnormality.  Patient received Solu-Medrol, magnesium and albuterol in the ED.  Patient as above  has some concern about leaving the ED and per reports had stated that he would probably just end up coming right back.  Review of Systems: As per HPI otherwise all other systems reviewed and are negative.  Past Medical History:  Diagnosis Date   Anginal pain ([place]) 01/2012   Anxiety    Atherosclerotic heart disease of native coronary artery without angina pectoris    BPH (benign prostatic hypertrophy)    CAD (coronary artery disease) 2007   with LAD/Diag CBPTCA and kissing    CKD (chronic kidney disease), stage III ([place])    Archie Endo 01/04/2017   COPD (chronic obstructive pulmonary disease) ([place])    "maybe" (02/14/2012)   Coronary artery disease    Depression    GERD (gastroesophageal reflux disease)    Hyperlipidemia    Hypertension    Hypoxemia    Myocardial infarction ([place]) 01/22/2012   NSTEMI with DES to RSA (PDA)   On home oxygen therapy    Shortness of breath    "walking and laying down sometimes" (02/14/2012)    Past Surgical History:  Procedure Laterality Date   CATARACT EXTRACTION W/ INTRAOCULAR LENS  IMPLANT, BILATERAL  ~ 2010   CORONARY ANGIOPLASTY WITH STENT PLACEMENT  05/11/2005   PCI OF LAD   GANGLION CYST REMOVED  1956?   right   LEFT HEART CATHETERIZATION WITH CORONARY ANGIOGRAM Bilateral 01/24/2012   Procedure: LEFT HEART CATHETERIZATION WITH  CORONARY ANGIOGRAM;  Surgeon: Sinclair Grooms, MD;  Location: [facility] CATH LAB;  Service: Cardiovascular;  Laterality: Bilateral;   REFRACTIVE SURGERY  2010   left    Social History  reports that he quit smoking about 10 years ago. His smoking use included pipe. He has never used smokeless tobacco. He reports that he does not currently use alcohol after a past usage of about 7.0 standard drinks per week. He reports that he does not use drugs.  Allergies  Allergen Reactions   Lipitor [Atorvastatin] Other (See Comments)    "just paralyzed me; I can't take it"   Statins Other (See Comments)    "paralyzed me; I can't take it"    Rosuvastatin     Leg aches    Family History  Problem Relation Age of Onset   Heart failure Mother    Heart failure Brother   Reviewed on admission  Prior to Admission medications   Medication Sig Start Date End Date Taking? Authorizing Provider  acetaminophen (TYLENOL) 500 MG tablet Take 500 mg by mouth every 6 (six) hours as needed.    [provider]  albuterol (PROVENTIL HFA;VENTOLIN HFA) 108 (90 BASE) MCG/ACT inhaler Inhale 2 puffs into the lungs every 6 (six) hours as needed for wheezing. Reported on 05/04/2015    [provider]  allopurinol (ZYLOPRIM) 100 MG tablet Take 100 mg by mouth daily. 11/07/20   [provider]  BISACODYL PO Take 1 tablet by mouth daily as needed.    [provider]  diclofenac (VOLTAREN) 75 MG EC tablet 75 mg. Take 2 tablets at the onset of gout then 1 tablet twice daily as needed.    [provider]  ELIQUIS 5 MG TABS tablet TAKE 1 TABLET (5 MG TOTAL) BY MOUTH 2 (TWO) TIMES DAILY. NEED ANNUAL MD APPT FOR REFILLS 04/13/21   Belva Crome, MD  finasteride (PROSCAR) 5 MG tablet Take 5 mg by mouth daily. Reported on 05/04/2015    [provider]  Fluticasone-Umeclidin-Vilant (TRELEGY ELLIPTA) 100-62.5-25 MCG/INH AEPB Inhale 1 puff into the lungs daily. 01/13/21   Freddi Starr, MD  furosemide (LASIX) 40 MG tablet Take 40 mg by mouth daily.  05/29/18   [provider]  metoprolol tartrate (LOPRESSOR) 25 MG tablet TAKE 1 TABLET TWICE DAILY 03/22/20   Belva Crome, MD  mometasone-formoterol [facility]) 100-5 MCG/ACT AERO Inhale 2 puffs into the lungs 2 (two) times daily. 02/08/15   Rigoberto Noel, MD  nitroGLYCERIN (NITROSTAT) 0.4 MG SL tablet Place 1 tablet (0.4 mg total) under the tongue every 5 (five) minutes as needed. Reported on 05/04/2015 08/20/17   Isaiah Serge, NP  potassium chloride (KLOR-CON 10) 10 MEQ tablet Take 1 tablet (10 mEq total) by mouth daily. 12/25/17   Belva Crome, MD   pravastatin (PRAVACHOL) 20 MG tablet TAKE 1/2 TABLET EVERY EVENING 02/24/21   Belva Crome, MD  QUEtiapine (SEROQUEL) 50 MG tablet Take 50 mg by mouth at bedtime. 11/09/20   [provider]  tamsulosin (FLOMAX) 0.4 MG CAPS capsule Take 0.4 mg by mouth daily.    [provider]    Physical Exam: Vitals:   05/01/21 1458 05/01/21 1600 05/01/21 1630 05/01/21 1800  BP:  137/70 132/76 (!) 153/78  Pulse:  (!) 56 (!) 55 62  Resp:  15 19 16   Temp:      TempSrc:      SpO2:  97% 97% 97%  Weight: 104.3 kg  Height: 5\' 10"  (1.778 m)      Physical Exam Constitutional:      General: He is not in acute distress.    Appearance: Normal appearance.  HENT:     Head: Normocephalic and atraumatic.     Mouth/Throat:     Mouth: Mucous membranes are moist.     Pharynx: Oropharynx is clear.  Eyes:     Extraocular Movements: Extraocular movements intact.     Pupils: Pupils are equal, round, and reactive to light.  Cardiovascular:     Rate and Rhythm: Normal rate and regular rhythm.     Pulses: Normal pulses.     Heart sounds: Normal heart sounds.  Pulmonary:     Effort: Pulmonary effort is normal. No respiratory distress.     Breath sounds: Wheezing present.  Abdominal:     General: Bowel sounds are normal. There is no distension.     Palpations: Abdomen is soft.     Tenderness: There is no abdominal tenderness.  Musculoskeletal:        General: No swelling or deformity.  Skin:    General: Skin is warm and dry.  Neurological:     General: No focal deficit present.     Mental Status: Mental status is at baseline.   Labs on Admission: I have personally reviewed following labs and imaging studies  CBC: Recent Labs  Lab 05/01/21 1513  WBC 9.0  NEUTROABS 6.4  HGB 16.9  HCT 52.9*  MCV 92.8  PLT [id number]    Basic Metabolic Panel: Recent Labs  Lab 05/01/21 1513  NA 139  K 4.0  CL 98  CO2 35*  GLUCOSE 104*  BUN 21  CREATININE 1.42*  CALCIUM 8.9     GFR: Estimated Creatinine Clearance: 49.3 mL/min (A) (by C-G formula based on SCr of 1.42 mg/dL (H)).  Liver Function Tests: Recent Labs  Lab 05/01/21 1513  AST 17  ALT 13  ALKPHOS 72  BILITOT 0.9  PROT 6.3*  ALBUMIN 3.8    Urine analysis:    Component Value Date/Time   COLORURINE YELLOW 06/22/2019 1341   APPEARANCEUR CLEAR 06/22/2019 1341   LABSPEC 1.020 06/22/2019 1341   PHURINE 5.5 06/22/2019 1341   GLUCOSEU NEGATIVE 06/22/2019 1341   HGBUR NEGATIVE 06/22/2019 1341   BILIRUBINUR MODERATE (A) 06/22/2019 1341   KETONESUR 40 (A) 06/22/2019 1341   PROTEINUR NEGATIVE 06/22/2019 1341   UROBILINOGEN 1.0 04/29/2011 1809   NITRITE NEGATIVE 06/22/2019 1341   LEUKOCYTESUR NEGATIVE 06/22/2019 1341    Radiological Exams on Admission: DG Chest Port 1 View  Result Date: 05/01/2021 CLINICAL DATA:  Shortness of breath EXAM: PORTABLE CHEST 1 VIEW COMPARISON:  12/13/2020 FINDINGS: The heart size and mediastinal contours are within normal limits. Aortic atherosclerosis. Both lungs are clear. The visualized skeletal structures are unremarkable. IMPRESSION: No active disease. Electronically Signed   By: Donavan Foil M.D.   On: 05/01/2021 16:24    EKG: Independently reviewed.  Sinus rhythm at 53 bpm.  Normal voltage.  aVL, V2 V3.  Nonspecific T wave flattening and aVL and V1.  Assessment/Plan Principal Problem:   COPD exacerbation (HCC) Active Problems:   Essential hypertension   Coronary artery disease involving native coronary artery of native heart with angina pectoris (HCC)   Hyperlipemia   Chronic respiratory failure (HCC)   CKD (chronic kidney disease), stage III (HCC)   Gout of right knee   Atypical atrial flutter (HCC)   Benign prostatic hyperplasia   Chronic congestive heart failure (  [place])  COPD exacerbation Chronic respiratory failure with hypoxia > Patient presenting with some worsening of his chronic shortness of breath ever since Thanksgiving but significantly  worse for the past 2 days. > Lab work-up in the ED overall reassuring with normal troponin, normal BNP, stable electrolytes and CBC.  Chest x-ray without acute abnormality. > He does appear to be on his home chronically liters.  He does have diffuse wheezing on exam. > Breath and significant concern of her current condition and feels uncomfortable going home as he lives alone.  Patient may need better optimization of his outpatient regimen in addition to treatment for this acute worsening in the past couple days. > Received albuterol neb, Solu-Medrol, magnesium in the ED. > Negative for flu or COVID in the ED. > Out patient regimen needs to be clarified appears to be either Trelegy or Breo history plus Atrovent. - We will monitor overnight - Continue with home Trelegy - We will schedule DuoNebs every 6 hours - As needed albuterol every 2 hours - Daily prednisone - Supplemental oxygen - Check RVP  CHF > Echo in 08-16-2016 with EF 50-55% with grade 1 diastolic dysfunction.  He has poor acoustic windows and has had echoes that are difficult to interpret in the past per chart review. > As per HPI takes Lasix only in days is not going out. > BNP was normal in the ED. - Continue home Lasix and metoprolol  CAD Hyperlipidemia > Status post stenting - Continue home metoprolol, pravastatin  Anxiety Depression Insomnia > Patient has had some symptoms of anxiety and depression since the death of his son in 08/17/2018. > Not currently on any medication for this.  Unclear how his anxiety may exacerbate his symptoms of shortness of breath.  We will work to get him through his initial flare and he may need to reassess this outpatient. - Continue home Seroquel nightly  CKD 3 > Creatinine stable at 1.42 in the ED. - Avoid nephrotoxic agents - Trend renal function electrolytes  Atrial flutter - Continue home metoprolol and Eliquis  Hypertension - Continue home Lasix, metoprolol  Gout - Continue home  allopurinol  BPH - Continue home finasteride    DVT prophylaxis: Eliquis Code Status:   Full  Family Communication:  None on admission. Disposition Plan:   Patient is from:  Home  Anticipated DC to:  Home  Anticipated DC date:  1 to 2 days  Anticipated DC barriers: None  Consults called:  None  Admission status:  Observation, telemetry  Severity of Illness: The appropriate patient status for this patient is OBSERVATION. Observation status is judged to be reasonable and necessary in order to provide the required intensity of service to ensure the patient's safety. The patient's presenting symptoms, physical exam findings, and initial radiographic and laboratory data in the context of their medical condition is felt to place them at decreased risk for further clinical deterioration. Furthermore, it is anticipated that the patient will be medically stable for discharge from the hospital within 2 midnights of admission.    Marcelyn Bruins MD [facility]  How to contact the [facility] Attending or Consulting provider [place] or covering provider during after hours [place], for this patient?   Check the care team in [facility] and look for a) attending/consulting [facility] provider listed and b) the [facility] team listed Log into [email or web address] and use [place]'s universal password to access. If you do not have the password, please contact the hospital operator.  Locate the [facility] provider you are looking for under [facility] and page to a number that you can be directly reached. If you still have difficulty reaching the provider, please page the [facility] (Director on Call) for the Hospitalists listed on amion for assistance.  05/01/2021, 7:16 PM

## 2021-05-01 NOTE — ED Provider Notes (Signed)
Mitchell [name] Provider Note   CSN: [id number] Arrival date & time: 05/01/21  1432     History  Chief Complaint  Patient presents with   Shortness of Breath    Mitchell [name] is a 81 y.o. male.  He has a history of COPD and is on 2-3 L nasal cannula, A. fib on Eliquis, CHF on diuretics.  He is complaining of increased shortness of breath for the last 3 weeks.  On and off left-sided chest pain.  Has tried to increase oxygen without improvement.  He said he has been compliant with his diuretics  The history is provided by the patient.  Shortness of Breath Severity:  Severe Onset quality:  Gradual Duration:  3 weeks Timing:  Intermittent Progression:  Worsening Chronicity:  Recurrent Context: activity   Relieved by:  Nothing Worsened by:  Activity Ineffective treatments:  Oxygen, rest and diuretics Associated symptoms: chest pain, cough and sputum production   Associated symptoms: no abdominal pain, no fever, no headaches, no hemoptysis, no neck pain, no rash, no sore throat and no vomiting   Risk factors: no tobacco use       Home Medications Prior to Admission medications   Medication Sig Start Date End Date Taking? Authorizing Provider  acetaminophen (TYLENOL) 500 MG tablet Take 500 mg by mouth every 6 (six) hours as needed.    [provider]  albuterol (PROVENTIL HFA;VENTOLIN HFA) 108 (90 BASE) MCG/ACT inhaler Inhale 2 puffs into the lungs every 6 (six) hours as needed for wheezing. Reported on 05/04/2015    [provider]  allopurinol (ZYLOPRIM) 100 MG tablet Take 100 mg by mouth daily. 11/07/20   [provider]  BISACODYL PO Take 1 tablet by mouth daily as needed.    [provider]  diclofenac (VOLTAREN) 75 MG EC tablet 75 mg. Take 2 tablets at the onset of gout then 1 tablet twice daily as needed.    [provider]  ELIQUIS 5 MG TABS tablet TAKE 1 TABLET (5 MG TOTAL) BY MOUTH 2 (TWO) TIMES  DAILY. NEED ANNUAL MD APPT FOR REFILLS 04/13/21   Belva Crome, MD  finasteride (PROSCAR) 5 MG tablet Take 5 mg by mouth daily. Reported on 05/04/2015    [provider]  Fluticasone-Umeclidin-Vilant (TRELEGY ELLIPTA) 100-62.5-25 MCG/INH AEPB Inhale 1 puff into the lungs daily. 01/13/21   Freddi Starr, MD  furosemide (LASIX) 40 MG tablet Take 40 mg by mouth daily.  05/29/18   [provider]  metoprolol tartrate (LOPRESSOR) 25 MG tablet TAKE 1 TABLET TWICE DAILY 03/22/20   Belva Crome, MD  mometasone-formoterol [facility]) 100-5 MCG/ACT AERO Inhale 2 puffs into the lungs 2 (two) times daily. 02/08/15   Rigoberto Noel, MD  nitroGLYCERIN (NITROSTAT) 0.4 MG SL tablet Place 1 tablet (0.4 mg total) under the tongue every 5 (five) minutes as needed. Reported on 05/04/2015 08/20/17   Isaiah Serge, NP  potassium chloride (KLOR-CON 10) 10 MEQ tablet Take 1 tablet (10 mEq total) by mouth daily. 12/25/17   Belva Crome, MD  pravastatin (PRAVACHOL) 20 MG tablet TAKE 1/2 TABLET EVERY EVENING 02/24/21   Belva Crome, MD  QUEtiapine (SEROQUEL) 50 MG tablet Take 50 mg by mouth at bedtime. 11/09/20   [provider]  tamsulosin (FLOMAX) 0.4 MG CAPS capsule Take 0.4 mg by mouth daily.    [provider]      Allergies    Lipitor [atorvastatin], Statins, and  Rosuvastatin    Review of Systems   Review of Systems  Constitutional:  Negative for fever.  HENT:  Negative for sore throat.   Eyes:  Negative for visual disturbance.  Respiratory:  Positive for cough, sputum production and shortness of breath. Negative for hemoptysis.   Cardiovascular:  Positive for chest pain.  Gastrointestinal:  Negative for abdominal pain and vomiting.  Genitourinary:  Negative for dysuria.  Musculoskeletal:  Negative for neck pain.  Skin:  Negative for rash.  Neurological:  Negative for headaches.   Physical Exam Updated Vital Signs BP (!) 141/84 (BP Location: Left Arm)    Pulse 62     Temp 97.6 F (36.4 C) (Oral)    Resp 20    Ht 5\' 10"  (1.778 m)    Wt 104.3 kg    SpO2 97%    BMI 33.00 kg/m  Physical Exam Vitals and nursing note reviewed.  Constitutional:      General: He is not in acute distress.    Appearance: He is well-developed.  HENT:     Head: Normocephalic and atraumatic.  Eyes:     Conjunctiva/sclera: Conjunctivae normal.  Cardiovascular:     Rate and Rhythm: Normal rate and regular rhythm.     Heart sounds: No murmur heard. Pulmonary:     Effort: Pulmonary effort is normal. No respiratory distress.     Breath sounds: Decreased breath sounds present.  Abdominal:     Palpations: Abdomen is soft.     Tenderness: There is no abdominal tenderness.  Musculoskeletal:        General: No swelling.     Cervical back: Neck supple.     Right lower leg: No tenderness. Edema present.     Left lower leg: No tenderness. Edema present.     Comments: Trace edema bilateral lower extremities  Skin:    General: Skin is warm and dry.     Capillary Refill: Capillary refill takes less than 2 seconds.  Neurological:     General: No focal deficit present.     Mental Status: He is alert.    ED Results / Procedures / Treatments   Labs (all labs ordered are listed, but only abnormal results are displayed) Labs Reviewed  COMPREHENSIVE METABOLIC PANEL - Abnormal; Notable for the following components:      Result Value   CO2 35 (*)    Glucose, Bld 104 (*)    Creatinine, Ser 1.42 (*)    Total Protein 6.3 (*)    GFR, Estimated 50 (*)    All other components within normal limits  CBC WITH DIFFERENTIAL/PLATELET - Abnormal; Notable for the following components:   HCT 52.9 (*)    All other components within normal limits  COMPREHENSIVE METABOLIC PANEL - Abnormal; Notable for the following components:   Glucose, Bld 252 (*)    BUN 25 (*)    Creatinine, Ser 1.26 (*)    Total Protein 5.8 (*)    Albumin 3.4 (*)    GFR, Estimated 57 (*)    All other components within  normal limits  RESP PANEL BY RT-PCR (FLU A&B, COVID) ARPGX2  RESPIRATORY PANEL BY PCR  BRAIN NATRIURETIC PEPTIDE  CBC  TROPONIN I (HIGH SENSITIVITY)  TROPONIN I (HIGH SENSITIVITY)    EKG EKG Interpretation  Date/Time:  Monday May 01 2021 15:50:49 EST Ventricular Rate:  53 PR Interval:  195 QRS Duration: 87 QT Interval:  437 QTC Calculation: 411 R Axis:   54 Text Interpretation: Sinus  rhythm Consider left atrial enlargement Abnormal R-wave progression, early transition ST elevation, consider inferior injury No significant change since prior 8/22 Confirmed by Aletta Edouard [phone]) on 05/01/2021 4:00:13 PM  Radiology DG Chest Port 1 View  Result Date: 05/01/2021 CLINICAL DATA:  Shortness of breath EXAM: PORTABLE CHEST 1 VIEW COMPARISON:  12/13/2020 FINDINGS: The heart size and mediastinal contours are within normal limits. Aortic atherosclerosis. Both lungs are clear. The visualized skeletal structures are unremarkable. IMPRESSION: No active disease. Electronically Signed   By: Donavan Foil M.D.   On: 05/01/2021 16:24    Procedures Procedures    Medications Ordered in ED Medications  allopurinol (ZYLOPRIM) tablet 100 mg (100 mg Oral Given 05/02/21 0937)  furosemide (LASIX) tablet 40 mg (40 mg Oral Given 05/02/21 0937)  metoprolol tartrate (LOPRESSOR) tablet 25 mg (25 mg Oral Given 05/02/21 0937)  pravastatin (PRAVACHOL) tablet 20 mg (20 mg Oral Given 05/01/21 2132)  apixaban (ELIQUIS) tablet 5 mg (5 mg Oral Given 05/02/21 0937)  predniSONE (DELTASONE) tablet 40 mg (40 mg Oral Given 05/02/21 0936)  albuterol (PROVENTIL) (2.5 MG/3ML) 0.083% nebulizer solution 2.5 mg (has no administration in time range)  sodium chloride flush (NS) 0.9 % injection 3 mL (3 mLs Intravenous Given 05/01/21 2132)  acetaminophen (TYLENOL) tablet 650 mg (650 mg Oral Given 05/01/21 2132)    Or  acetaminophen (TYLENOL) suppository 650 mg ( Rectal See Alternative 05/01/21 2132)  polyethylene glycol (MIRALAX /  GLYCOLAX) packet 17 g (has no administration in time range)  fluticasone furoate-vilanterol (BREO ELLIPTA) 100-25 MCG/ACT 1 puff (1 puff Inhalation Given 05/02/21 0818)    And  umeclidinium bromide (INCRUSE ELLIPTA) 62.5 MCG/ACT 1 puff (1 puff Inhalation Given 05/02/21 0817)  albuterol (PROVENTIL) (2.5 MG/3ML) 0.083% nebulizer solution 2.5 mg (has no administration in time range)  methylPREDNISolone sodium succinate (SOLU-MEDROL) 125 mg/2 mL injection 125 mg (125 mg Intravenous Given 05/01/21 1728)  magnesium sulfate IVPB 2 g 50 mL (0 g Intravenous Stopped 05/01/21 1837)  albuterol (VENTOLIN HFA) 108 (90 Base) MCG/ACT inhaler 8 puff (8 puffs Inhalation Given 05/01/21 1728)  melatonin tablet 3 mg (3 mg Oral Given 05/02/21 0106)    ED Course/ Medical Decision Making/ A&P Clinical Course as of 05/02/21 1027  Mon May 01, 2021  1629 Chest x-ray interpreted by me as no acute infiltrates.  Awaiting radiology reading. [MB]  1903 Discussed with Dr. Trilby Drummer [facility] hospitalist who will evaluate the patient for admission. [MB]    Clinical Course User Index [MB] Hayden Rasmussen, MD                           Medical Decision Making Amount and/or Complexity of Data Reviewed Labs: ordered. Radiology: ordered.  Risk Prescription drug management. Decision regarding hospitalization.  HAEDYN ODLE was evaluated in Emergency Department on 05/01/2021 for the symptoms described in the history of present illness. He was evaluated in the context of the global COVID-19 pandemic, which necessitated consideration that the patient might be at risk for infection with the SARS-CoV-2 virus that causes COVID-19. Institutional protocols and algorithms that pertain to the evaluation of patients at risk for COVID-19 are in a state of rapid change based on information released by regulatory bodies including the CDC and federal and state organizations. These policies and algorithms were followed during the patient's care in the  ED.  This patient complains of increased shortness of breath over months worse over the last few days productive of sputum;  this involves an extensive number of treatment Options and is a complaint that carries with it a high risk of complications and Morbidity. The differential includes COPD, CHF, COVID, flu, pneumothorax, PE, anemia  I ordered, reviewed and interpreted labs, which included CBC with normal white count normal hemoglobin, chemistries with elevated glucose, creatinine mildly elevated similar to priors, BMP unremarkable, troponins flat, COVID and flu negative I ordered medication IV magnesium and steroids, inhalational treatments I ordered imaging studies which included chest x-ray and I independently    visualized and interpreted imaging which showed no acute findings Additional history obtained from EMS Previous records obtained and reviewed in epic including prior cardiology and pulmonology notes I consulted Dr. Trilby Drummer [facility] hospitalist and discussed lab and imaging findings  Critical Interventions: None  After the interventions stated above, I reevaluated the patient and found patient to be reasonably comfortable, not tachypneic or hypoxic on his home oxygen.  Patient very concerned that he would not do well being discharged.  We will consult [facility] hospitalist for evaluation for possible admission. CHA2DS2/VAS Stroke Risk Points  Current as of 34 minutes ago     5 >= 2 Points: High Risk  1 - 1.99 Points: Medium Risk  0 Points: Low Risk    Last Change: N/A      Details    This score determines the patient's risk of having a stroke if the  patient has atrial fibrillation.       Points Metrics  1 Has Congestive Heart Failure:  Yes    Current as of 34 minutes ago  1 Has Vascular Disease:  Yes    Current as of 34 minutes ago  1 Has Hypertension:  Yes    Current as of 34 minutes ago  2 Age:  50    Current as of 34 minutes ago  0 Has Diabetes:  No    Current as of 34  minutes ago  0 Had Stroke:  No  Had TIA:  No  Had Thromboembolism:  No    Current as of 34 minutes ago  0 Male:  No    Current as of 34 minutes ago                   Final Clinical Impression(s) / ED Diagnoses Final diagnoses:  COPD exacerbation ([place])    Rx / DC Orders ED Discharge Orders     None         Hayden Rasmussen, MD 05/02/21 1031

## 2021-05-01 NOTE — Progress Notes (Signed)
Pt states he needs a raised commode seat with side rails. Pt states he needs a shower chair. Pt states he needs new oxygen cording. Pt states he sleeps in bedroom and his machine is in the living room and paramedics told him the cord is too long to carry the oxygen. Order left for toc team to assist patient. Briscoe Burns BSN, RN-BC Admissions RN 05/01/2021 8:12 PM

## 2021-05-01 NOTE — ED Notes (Signed)
Pt's caregiver updated on patient's status.

## 2021-05-02 ENCOUNTER — Observation Stay (HOSPITAL_COMMUNITY): Payer: Medicare PPO

## 2021-05-02 ENCOUNTER — Other Ambulatory Visit: Payer: Self-pay

## 2021-05-02 DIAGNOSIS — J441 Chronic obstructive pulmonary disease with (acute) exacerbation: Secondary | ICD-10-CM | POA: Diagnosis not present

## 2021-05-02 DIAGNOSIS — R0602 Shortness of breath: Secondary | ICD-10-CM | POA: Diagnosis not present

## 2021-05-02 DIAGNOSIS — R079 Chest pain, unspecified: Secondary | ICD-10-CM | POA: Diagnosis not present

## 2021-05-02 DIAGNOSIS — I700 Atherosclerosis of aorta: Secondary | ICD-10-CM | POA: Diagnosis not present

## 2021-05-02 DIAGNOSIS — J9811 Atelectasis: Secondary | ICD-10-CM | POA: Diagnosis not present

## 2021-05-02 DIAGNOSIS — R911 Solitary pulmonary nodule: Secondary | ICD-10-CM

## 2021-05-02 DIAGNOSIS — K8020 Calculus of gallbladder without cholecystitis without obstruction: Secondary | ICD-10-CM | POA: Diagnosis not present

## 2021-05-02 LAB — COMPREHENSIVE METABOLIC PANEL
ALT: 13 U/L (ref 0–44)
AST: 17 U/L (ref 15–41)
Albumin: 3.4 g/dL — ABNORMAL LOW (ref 3.5–5.0)
Alkaline Phosphatase: 67 U/L (ref 38–126)
Anion gap: 8 (ref 5–15)
BUN: 25 mg/dL — ABNORMAL HIGH (ref 8–23)
CO2: 29 mmol/L (ref 22–32)
Calcium: 9.1 mg/dL (ref 8.9–10.3)
Chloride: 99 mmol/L (ref 98–111)
Creatinine, Ser: 1.26 mg/dL — ABNORMAL HIGH (ref 0.61–1.24)
GFR, Estimated: 57 mL/min — ABNORMAL LOW (ref >60)
Glucose, Bld: 252 mg/dL — ABNORMAL HIGH (ref 70–99)
Potassium: 3.9 mmol/L (ref 3.5–5.1)
Sodium: 136 mmol/L (ref 135–145)
Total Bilirubin: 0.6 mg/dL (ref 0.3–1.2)
Total Protein: 5.8 g/dL — ABNORMAL LOW (ref 6.5–8.1)

## 2021-05-02 LAB — CBC
HCT: 50.9 % (ref 39.0–52.0)
Hemoglobin: 16.2 g/dL (ref 13.0–17.0)
MCH: 29.3 pg (ref 26.0–34.0)
MCHC: 31.8 g/dL (ref 30.0–36.0)
MCV: 92 fL (ref 80.0–100.0)
Platelets: 197 10*3/uL (ref 150–400)
RBC: 5.53 MIL/uL (ref 4.22–5.81)
RDW: 14 % (ref 11.5–15.5)
WBC: 7.8 10*3/uL (ref 4.0–10.5)
nRBC: 0 % (ref 0.0–0.2)

## 2021-05-02 MED ORDER — DICLOFENAC SODIUM 1 % EX GEL
2.0000 g | Freq: Four times a day (QID) | CUTANEOUS | Status: DC
  Administered 2021-05-03 (×2): 2 g via TOPICAL
  Filled 2021-05-02: qty 100

## 2021-05-02 MED ORDER — IPRATROPIUM-ALBUTEROL 0.5-2.5 (3) MG/3ML IN SOLN: 3.0000 mL | Freq: Four times a day (QID) | RESPIRATORY_TRACT | Status: DC

## 2021-05-02 MED ORDER — METHYLPREDNISOLONE SODIUM SUCC 40 MG IJ SOLR
40.0000 mg | Freq: Every day | INTRAMUSCULAR | Status: DC
  Administered 2021-05-02 – 2021-05-03 (×2): 40 mg via INTRAVENOUS
  Filled 2021-05-02 (×2): qty 1

## 2021-05-02 MED ORDER — IOHEXOL 350 MG/ML SOLN
100.0000 mL | Freq: Once | INTRAVENOUS | Status: AC | PRN
  Administered 2021-05-02: 100 mL via INTRAVENOUS

## 2021-05-02 MED ORDER — ALBUTEROL SULFATE (2.5 MG/3ML) 0.083% IN NEBU
2.5000 mg | INHALATION_SOLUTION | Freq: Three times a day (TID) | RESPIRATORY_TRACT | Status: DC
  Administered 2021-05-02: 2.5 mg via RESPIRATORY_TRACT
  Filled 2021-05-02: qty 3

## 2021-05-02 MED ORDER — ALBUTEROL SULFATE (2.5 MG/3ML) 0.083% IN NEBU
2.5000 mg | INHALATION_SOLUTION | Freq: Four times a day (QID) | RESPIRATORY_TRACT | Status: DC
  Administered 2021-05-03 – 2021-05-05 (×9): 2.5 mg via RESPIRATORY_TRACT
  Filled 2021-05-02 (×9): qty 3

## 2021-05-02 MED ORDER — MELATONIN 3 MG PO TABS
3.0000 mg | ORAL_TABLET | Freq: Every day | ORAL | Status: DC
  Administered 2021-05-02 – 2021-05-04 (×2): 3 mg via ORAL
  Filled 2021-05-02 (×3): qty 1

## 2021-05-02 MED ORDER — ALBUTEROL SULFATE (2.5 MG/3ML) 0.083% IN NEBU
2.5000 mg | INHALATION_SOLUTION | Freq: Two times a day (BID) | RESPIRATORY_TRACT | Status: DC
  Administered 2021-05-02: 2.5 mg via RESPIRATORY_TRACT
  Filled 2021-05-02: qty 3

## 2021-05-02 MED ORDER — MELATONIN 3 MG PO TABS
3.0000 mg | ORAL_TABLET | Freq: Once | ORAL | Status: AC
  Administered 2021-05-02: 3 mg via ORAL
  Filled 2021-05-02: qty 1

## 2021-05-02 NOTE — Assessment & Plan Note (Signed)
Home seroquel

## 2021-05-02 NOTE — Assessment & Plan Note (Signed)
Eliquis, metop ?

## 2021-05-02 NOTE — Assessment & Plan Note (Signed)
allopurinol

## 2021-05-02 NOTE — Assessment & Plan Note (Signed)
Concerning for malignancy Consider pulm c/s in AM

## 2021-05-02 NOTE — Hospital Course (Signed)
Mitchell [name] is [name] 81 y.o. male with medical history significant of vision pain, hyperlipidemia, depression, anxiety, hypertension, CAD status post ending, COPD, CKD 3, chronic respiratory failure on home oxygen, gout, Uthman Mroczkowski flutter who presents with worsening shortness of breath.  He's being treated for Makenlee Mckeag COPD exacerbation.  CT without PE, but notable for RUL pulm nodule.  At this time awaiting PT eval to help with disposition.  He remains on his home O2, but c/o SOB greater than his normal.  See below for additional details

## 2021-05-02 NOTE — Assessment & Plan Note (Signed)
Currently on home 3 L [place], but c/o SOB worse than normal CT PE protocol as wheezing not as impressive today  Continue IV steroids Scheduled and prn nebs Follow [facility] Negative covid, flu Will add azithromycin

## 2021-05-02 NOTE — Assessment & Plan Note (Signed)
Metop, lasix

## 2021-05-02 NOTE — Assessment & Plan Note (Signed)
Appears euvolemic Echo 2018 with "probably" normal systolic function Home lasix, metoprolol

## 2021-05-02 NOTE — Assessment & Plan Note (Signed)
Stable creatinine

## 2021-05-02 NOTE — Assessment & Plan Note (Signed)
finasteride

## 2021-05-02 NOTE — Progress Notes (Signed)
PT Cancellation Note  Patient Details Name: Mitchell [name] MRN: [id number] DOB: October 06, 1940   Cancelled Treatment:    Reason Eval/Treat Not Completed: Medical issues which prohibited therapy. Pt awaiting CT angio-will hold PT for now. Will check back on tomorrow to attempt PT eval. Thanks.    Faye Ramsay, PT Acute Rehabilitation  Office: [phone] Pager: [phone]

## 2021-05-02 NOTE — TOC Initial Note (Signed)
Transition of Care [facility]) - Initial/Assessment Note    Patient Details  Name: Mitchell [name] MRN: [id number] Date of Birth: 04-Feb-1941  [facility] CM/SW Contact:    Dessa Phi, RN Phone Number: 05/02/2021, 1:11 PM  Clinical Narrative:   Await PT cons & recc.                Expected Discharge Plan: [place] Barriers to Discharge: Continued Medical Work up   Patient Goals and CMS Choice        Expected Discharge Plan and Services Expected Discharge Plan: [place]                                              Prior Living Arrangements/Services                       Activities of Daily Living Home Assistive Devices/Equipment: Cane (specify quad or straight), Shower chair with back, Oxygen ADL Screening (condition at time of admission) Patient's cognitive ability adequate to safely complete daily activities?: Yes Is the patient deaf or have difficulty hearing?: Yes Does the patient have difficulty seeing, even when wearing glasses/contacts?: Yes Does the patient have difficulty concentrating, remembering, or making decisions?: No Patient able to express need for assistance with ADLs?: Yes Does the patient have difficulty dressing or bathing?: No Independently performs ADLs?: Yes (appropriate for developmental age) Does the patient have difficulty walking or climbing stairs?: Yes Weakness of Legs: Both Weakness of Arms/Hands: Both  Permission Sought/Granted                  Emotional Assessment              Admission diagnosis:  COPD exacerbation ([place]) [J44.1] Patient Active Problem List   Diagnosis Date Noted   COPD exacerbation ([place]) 05/01/2021   Benign prostatic hyperplasia 05/01/2021   Chronic congestive heart failure ([place]) 05/01/2021   Effusion, right knee 01/10/2017   Osteoarthritis of right knee 01/10/2017   Gout of right knee 01/10/2017   Dehydration    Atypical atrial  flutter ([place])    Dizziness 01/04/2017   Right shoulder pain 01/04/2017   Hypokalemia 01/04/2017   Influenza 05/10/2016   CKD (chronic kidney disease), stage III ([place]) 05/10/2016   Acute respiratory failure with hypoxia ([place]) 05/02/2016   AKI (acute kidney injury) ([place]) 05/02/2016   Gout flare 05/02/2016   Chronic respiratory failure ([place]) 03/15/2015   Vision impairment 01/31/2015   Right foot pain 01/31/2015   Obesity 01/31/2015   Alcohol use 01/31/2015   COPD (chronic obstructive pulmonary disease) ([place]) 04/30/2013   Dyspnea 02/14/2012   Hyperlipemia    History of depression    Chest pain 01/23/2012   Coronary artery disease involving native coronary artery of native heart with angina pectoris ([place]) 01/23/2012   Cholelithiasis    Essential hypertension 04/29/2011   PCP:  Lawerance Cruel, MD Pharmacy:   [id number] [address] [place] [address] Phone: [phone] Fax: [phone]  [facility] [place], [address] [place] [address] Phone: [phone] Fax: [phone]     Social Determinants of Health (SDOH) Interventions    Readmission Risk Interventions No flowsheet data found.

## 2021-05-02 NOTE — Assessment & Plan Note (Signed)
S/p stent Metoprolol, pravastatin

## 2021-05-02 NOTE — Progress Notes (Signed)
PROGRESS NOTE    Mitchell [name]  [id number] DOB: December 14, 1940 DOA: 05/01/2021 PCP: Lawerance Cruel, MD  Chief Complaint  Patient presents with   Shortness of Breath    Brief Narrative:  Mitchell [name] is Mitchell [name] 81 y.o. male with medical history significant of vision pain, hyperlipidemia, depression, anxiety, hypertension, CAD status post ending, COPD, CKD 3, chronic respiratory failure on home oxygen, gout, [place] flutter who presents with worsening shortness of breath.  He's being treated for Whitney Hillegass COPD exacerbation.  CT without PE, but notable for RUL pulm nodule.  At this time awaiting PT eval to help with disposition.  He remains on his home O2, but c/o SOB greater than his normal.  See below for additional details    Assessment & Plan:   Principal Problem:   COPD exacerbation ([place]) Active Problems:   Chronic respiratory failure with hypoxia ([place])   Right upper lobe pulmonary nodule   Chronic congestive heart failure (HCC)   Coronary artery disease involving native coronary artery of native heart with angina pectoris (HCC)   History of depression   CKD (chronic kidney disease), stage III (HCC)   Atypical atrial flutter (HCC)   Essential hypertension   Gout of right knee   Benign prostatic hyperplasia   Hyperlipemia   * COPD exacerbation (HCC)- (present on admission) Currently on home 3 L [place], but c/o SOB worse than normal CT PE protocol as wheezing not as impressive today  Continue IV steroids Scheduled and prn nebs Follow RVP Negative covid, flu Will add azithromycin  Right upper lobe pulmonary nodule Concerning for malignancy Consider pulm c/s in AM  Chronic congestive heart failure ([place]) Appears euvolemic Echo 2018 with "probably" normal systolic function Home lasix, metoprolol  Coronary artery disease involving native coronary artery of native heart with angina pectoris ([place])- (present on admission) S/p stent Metoprolol, pravastatin  History of depression Home  seroquel  CKD (chronic kidney disease), stage III ([place])- (present on admission) Stable creatinine  Atypical atrial flutter ([place])- (present on admission) Eliquis, metop  Essential hypertension- (present on admission) Metop, lasix  Gout of right knee- (present on admission) allopurinol  Benign prostatic hyperplasia- (present on admission) finasteride   DVT prophylaxis: eliquis Code Status: full Family Communication: none Disposition:   Status is: Observation  The patient remains OBS appropriate and will d/c before 2 midnights.      Consultants:  none  Procedures:  none  Antimicrobials:  Anti-infectives (From admission, onward)    None       Subjective: C/o SOB  Objective: Vitals:   05/02/21 1225 05/02/21 1536 05/02/21 1952 05/02/21 2022  BP: 129/64 113/66  127/70  Pulse: 81 74  85  Resp: 20 20  18   Temp: 97.8 F (36.6 C) 98.5 F (36.9 C)  97.8 F (36.6 C)  TempSrc: Oral   Oral  SpO2: 90% 93% 93% 95%  Weight:      Height:        Intake/Output Summary (Last 24 hours) at 05/02/2021 2023 Last data filed at 05/02/2021 1716 Gross per 24 hour  Intake 118 ml  Output 450 ml  Net -332 ml   Filed Weights   05/01/21 1458 05/02/21 0022 05/02/21 0441  Weight: 104.3 kg 103.6 kg 103.6 kg    Examination:  General exam: Appears calm and comfortable  Respiratory system: unlabored, no wheezing, complains of SOB and pleuritic CP Cardiovascular system: S1 & S2 heard, RRR.  Gastrointestinal system: Abdomen is nondistended, soft and nontender Central  nervous system: Alert and oriented. No focal neurological deficits. Extremities: no LEE Skin: No rashes, lesions or ulcers Psychiatry: Judgement and insight appear normal. Mood & affect appropriate.     Data Reviewed: I have personally reviewed following labs and imaging studies  CBC: Recent Labs  Lab 05/01/21 1513 05/02/21 0416  WBC 9.0 7.8  NEUTROABS 6.4  --   HGB 16.9 16.2  HCT 52.9* 50.9  MCV  92.8 92.0  PLT 214 [id number]    Basic Metabolic Panel: Recent Labs  Lab 05/01/21 1513 05/02/21 0416  NA 139 136  K 4.0 3.9  CL 98 99  CO2 35* 29  GLUCOSE 104* 252*  BUN 21 25*  CREATININE 1.42* 1.26*  CALCIUM 8.9 9.1    GFR: Estimated Creatinine Clearance: 55.4 mL/min (Starling Christofferson) (by C-G formula based on SCr of 1.26 mg/dL (H)).  Liver Function Tests: Recent Labs  Lab 05/01/21 1513 05/02/21 0416  AST 17 17  ALT 13 13  ALKPHOS 72 67  BILITOT 0.9 0.6  PROT 6.3* 5.8*  ALBUMIN 3.8 3.4*    CBG: No results for input(s): GLUCAP in the last 168 hours.   Recent Results (from the past 240 hour(s))  Resp Panel by RT-PCR (Flu Lui Bellis&B, Covid) Nasopharyngeal Swab     Status: None   Collection Time: 05/01/21  4:31 PM   Specimen: Nasopharyngeal Swab; Nasopharyngeal(NP) swabs in vial transport medium  Result Value Ref Range Status   SARS Coronavirus 2 by RT PCR NEGATIVE NEGATIVE Final    Comment: (NOTE) SARS-CoV-2 target nucleic acids are NOT DETECTED.  The SARS-CoV-2 RNA is generally detectable in upper respiratory specimens during the acute phase of infection. The lowest concentration of SARS-CoV-2 viral copies this assay can detect is 138 copies/mL. Darsi Tien negative result does not preclude SARS-Cov-2 infection and should not be used as the sole basis for treatment or other patient management decisions. Armanda Forand negative result may occur with  improper specimen collection/handling, submission of specimen other than nasopharyngeal swab, presence of viral mutation(s) within the areas targeted by this assay, and inadequate number of viral copies(<138 copies/mL). Ceylin Dreibelbis negative result must be combined with clinical observations, patient history, and epidemiological information. The expected result is Negative.  Fact Sheet for Patients:  EntrepreneurPulse.com.au  Fact Sheet for Healthcare Providers:  IncredibleEmployment.be  This test is no t yet approved or cleared by  the [place] FDA and  has been authorized for detection and/or diagnosis of SARS-CoV-2 by FDA under an Emergency Use Authorization (EUA). This EUA will remain  in effect (meaning this test can be used) for the duration of the COVID-19 declaration under Section 564(b)(1) of the Act, 21 U.S.C.section 360bbb-3(b)(1), unless the authorization is terminated  or revoked sooner.       Influenza Adeliz Tonkinson by PCR NEGATIVE NEGATIVE Final   Influenza B by PCR NEGATIVE NEGATIVE Final    Comment: (NOTE) The Xpert Xpress SARS-CoV-2/FLU/RSV plus assay is intended as an aid in the diagnosis of influenza from Nasopharyngeal swab specimens and should not be used as Shad Ledvina sole basis for treatment. Nasal washings and aspirates are unacceptable for Xpert Xpress SARS-CoV-2/FLU/RSV testing.  Fact Sheet for Patients: EntrepreneurPulse.com.au  Fact Sheet for Healthcare Providers: IncredibleEmployment.be  This test is not yet approved or cleared by the [place] FDA and has been authorized for detection and/or diagnosis of SARS-CoV-2 by FDA under an Emergency Use Authorization (EUA). This EUA will remain in effect (meaning this test can be used) for the duration of the COVID-19 declaration under  Section 564(b)(1) of the Act, 21 U.S.C. section 360bbb-3(b)(1), unless the authorization is terminated or revoked.  Performed at [facility], [place] [address] [place], [place] [address]          Radiology Studies: CT Angio Chest Pulmonary Embolism (PE) W or WO Contrast  Result Date: 05/02/2021 CLINICAL DATA:  Chest pain and shortness of breath. Concern for pulmonary embolism. EXAM: CT ANGIOGRAPHY CHEST WITH CONTRAST TECHNIQUE: Multidetector CT imaging of the chest was performed using the standard protocol during bolus administration of intravenous contrast. Multiplanar CT image reconstructions and MIPs were obtained to evaluate the vascular anatomy.  RADIATION DOSE REDUCTION: This exam was performed according to the departmental dose-optimization program which includes automated exposure control, adjustment of the mA and/or kV according to patient size and/or use of iterative reconstruction technique. CONTRAST:  171mL OMNIPAQUE IOHEXOL 350 MG/ML SOLN COMPARISON:  Chest CT dated 05/13/2016 and radiograph dated 05/01/2021. FINDINGS: Evaluation of this exam is limited due to respiratory motion artifact. Cardiovascular: There is no cardiomegaly or pericardial effusion. There is coronary vascular calcification. Mild atherosclerotic calcification of the thoracic aorta. No aneurysmal dilatation or dissection. Evaluation of the pulmonary arteries is limited due to respiratory motion artifact. No pulmonary artery embolus identified. Mediastinum/Nodes: No hilar or mediastinal adenopathy. The esophagus and the thyroid gland are grossly unremarkable. No mediastinal fluid collection. Lungs/Pleura: Bibasilar subpleural atelectasis and scarring. There is an 11 x 14 mm nodule with irregular and lobulated margins in the right upper lobe (67/6) concerning for malignancy. Clinical correlation and multidisciplinary consult is advised. There is no pleural effusion pneumothorax. Mucus secretions noted in the right lower lobe bronchus. The central airways remain patent. Upper Abdomen: Gallstones. Musculoskeletal: Degenerative changes of the spine. No acute osseous pathology. Old healed sternal fracture. Review of the MIP images confirms the above findings. IMPRESSION: 1. No CT evidence of pulmonary artery embolus. 2. Lacinda Curvin 11 x 14 mm nodule with irregular and lobulated margins in the right upper lobe concerning for malignancy. Multidisciplinary consult is advised. 3. Cholelithiasis. 4. Aortic Atherosclerosis (ICD10-I70.0). Electronically Signed   By: Anner Crete M.D.   On: 05/02/2021 19:57   DG Chest Port 1 View  Result Date: 05/01/2021 CLINICAL DATA:  Shortness of breath EXAM:  PORTABLE CHEST 1 VIEW COMPARISON:  12/13/2020 FINDINGS: The heart size and mediastinal contours are within normal limits. Aortic atherosclerosis. Both lungs are clear. The visualized skeletal structures are unremarkable. IMPRESSION: No active disease. Electronically Signed   By: Donavan Foil M.D.   On: 05/01/2021 16:24        Scheduled Meds:  albuterol  2.5 mg Nebulization BID   allopurinol  100 mg Oral Daily   apixaban  5 mg Oral BID   diclofenac Sodium  2 g Topical QID   fluticasone furoate-vilanterol  1 puff Inhalation Daily   And   umeclidinium bromide  1 puff Inhalation Daily   furosemide  40 mg Oral Daily   ipratropium-albuterol  3 mL Nebulization Q6H   methylPREDNISolone (SOLU-MEDROL) injection  40 mg Intravenous Daily   metoprolol tartrate  25 mg Oral BID   pravastatin  20 mg Oral q1800   sodium chloride flush  3 mL Intravenous Q12H   Continuous Infusions:   LOS: 0 days    Time spent: over 30 min    Fayrene Helper, MD [facility]   To contact the attending provider between 7A-7P or the covering provider during after hours 7P-7A, please log into the web site [email or web address] and access using  universal [place] password for that web site. If you do not have the password, please call the hospital operator.  05/02/2021, 8:23 PM

## 2021-05-03 DIAGNOSIS — J441 Chronic obstructive pulmonary disease with (acute) exacerbation: Secondary | ICD-10-CM | POA: Diagnosis not present

## 2021-05-03 MED ORDER — DICLOFENAC EPOLAMINE 1.3 % EX PTCH
1.0000 | MEDICATED_PATCH | Freq: Two times a day (BID) | CUTANEOUS | Status: DC
  Administered 2021-05-03 – 2021-05-05 (×4): 1 via TRANSDERMAL
  Filled 2021-05-03 (×4): qty 1

## 2021-05-03 MED ORDER — QUETIAPINE FUMARATE 25 MG PO TABS
50.0000 mg | ORAL_TABLET | Freq: Every day | ORAL | Status: AC
  Administered 2021-05-03: 50 mg via ORAL
  Filled 2021-05-03: qty 2

## 2021-05-03 MED ORDER — PREDNISONE 20 MG PO TABS
60.0000 mg | ORAL_TABLET | Freq: Every day | ORAL | Status: DC
  Administered 2021-05-04 – 2021-05-05 (×2): 60 mg via ORAL
  Filled 2021-05-03 (×2): qty 3

## 2021-05-03 MED ORDER — METHYLPREDNISOLONE SODIUM SUCC 40 MG IJ SOLR
40.0000 mg | Freq: Every day | INTRAMUSCULAR | Status: AC
  Administered 2021-05-04: 40 mg via INTRAVENOUS
  Filled 2021-05-03: qty 1

## 2021-05-03 NOTE — Progress Notes (Signed)
PROGRESS NOTE   ARLO HOAR  [id number] DOB: 1940/11/21 DOA: 05/01/2021 PCP: Lawerance Cruel, MD  Brief Narrative:   63 m CAD status post MI--stent to LAD in 2007,  HTN, dyslipidemia,  Perm Afib/flutter on Apixaban COPD on Oxygen 3L baseline  CKD stage 3 with baseline Cr of 1.2 Prior ETOH BPH  Presented to ED 1/16 SOB DOE 05/01/2021-since Thanksgiving has had this-also intermittent chest pain Evaluated 2 weeks prior to admission by EMS but refused ED transport Intermittently takes Lasix-does not take if he has to travel outside of his home   BNP normal, troponin normal electrolytes and CBC without issue-CXR negative  BUN/creatinine 21/1.4-->25/1.26 WBC 9.0--grams and 7.8 Hemoglobin 16 Eventual CT chest showed RUL nodule 11 X 14 mm irregular lobulated margins  Hospital-Problem based course  Acute exacerbation of COPD Chronic respiratory failure 3 L baseline Continue Solu-Medrol 40 daily--> transitioning to p.o. prednisone 1/19 AM continue albuterol 2.5 every 2 as needed, Breo Ellipta/Incruse Ellipta daily CAD status post MI with LAD stent 2007, HLD Continue metoprolol 25 twice daily, Pravachol 20 daily Permanent A. fib flutter on Eliquis CHADS2 score >4 Continue Eliquis 5 twice daily continued metoprolol as above CKD 3 Caution with meds-periodic labs Right upper lobe nodule 11 X 14 mm Outpatient pulmonary eval--no nodule present on prior chest CT 2013 Will need to be seen by pulmonary outpatient-I had a long chat with the son and his wife at the bedside about this diagnosis    DVT prophylaxis: Eliquis Code Status: Full Family Communication: Discussed at the bedside with his ex-wife and his son  Disposition:  Status is: Observation  The patient will require care spanning > 2 midnights and should be moved to inpatient because: Not ready to be discharged      Consultants:  None  Procedures: No  Antimicrobials: No   Subjective: Awake --having some  reproducible muscular pain in his left upper chest He is somewhat emotional His family is present  Objective: Vitals:   05/03/21 0500 05/03/21 0809 05/03/21 1148 05/03/21 1435  BP:    140/78  Pulse:    66  Resp:    20  Temp:    97.8 F (36.6 C)  TempSrc:    Oral  SpO2:  94% 94% 94%  Weight: 105.5 kg     Height:        Intake/Output Summary (Last 24 hours) at 05/03/2021 1557 Last data filed at 05/03/2021 0300 Gross per 24 hour  Intake --  Output 725 ml  Net -725 ml   Filed Weights   05/02/21 0022 05/02/21 0441 05/03/21 0500  Weight: 103.6 kg 103.6 kg 105.5 kg    Examination:  EOMI NCAT thick neck Mallampati 4 No icterus no pallor no JVD S1-S2 no murmur no rub no gallop Abdomen soft no rebound no guarding Mild lower extremity edema-trace Neurologically intact transitioning to p.o. prednisone   Data Reviewed: personally reviewed   CBC    Component Value Date/Time   WBC 7.8 05/02/2021 0416   RBC 5.53 05/02/2021 0416   HGB 16.2 05/02/2021 0416   HGB 15.9 01/30/2021 0000   HCT 50.9 05/02/2021 0416   HCT 46.1 01/30/2021 0000   PLT 197 05/02/2021 0416   PLT 176 01/30/2021 0000   MCV 92.0 05/02/2021 0416   MCV 87 01/30/2021 0000   MCH 29.3 05/02/2021 0416   MCHC 31.8 05/02/2021 0416   RDW 14.0 05/02/2021 0416   RDW 12.9 01/30/2021 0000   LYMPHSABS 1.2 05/01/2021 1513  MONOABS 1.0 05/01/2021 1513   EOSABS 0.3 05/01/2021 1513   BASOSABS 0.1 05/01/2021 1513   CMP Latest Ref Rng & Units 05/02/2021 05/01/2021 01/30/2021  Glucose 70 - 99 mg/dL 252(H) 104(H) 93  BUN 8 - 23 mg/dL 25(H) 21 20  Creatinine 0.61 - 1.24 mg/dL 1.26(H) 1.42(H) 1.39(H)  Sodium 135 - 145 mmol/L 136 139 139  Potassium 3.5 - 5.1 mmol/L 3.9 4.0 4.5  Chloride 98 - 111 mmol/L 99 98 102  CO2 22 - 32 mmol/L 29 35(H) 27  Calcium 8.9 - 10.3 mg/dL 9.1 8.9 9.2  Total Protein 6.5 - 8.1 g/dL 5.8(L) 6.3(L) -  Total Bilirubin 0.3 - 1.2 mg/dL 0.6 0.9 -  Alkaline Phos 38 - 126 U/L 67 72 -  AST 15 - 41  U/L 17 17 -  ALT 0 - 44 U/L 13 13 -     Radiology Studies: CT Angio Chest Pulmonary Embolism (PE) W or WO Contrast  Result Date: 05/02/2021 CLINICAL DATA:  Chest pain and shortness of breath. Concern for pulmonary embolism. EXAM: CT ANGIOGRAPHY CHEST WITH CONTRAST TECHNIQUE: Multidetector CT imaging of the chest was performed using the standard protocol during bolus administration of intravenous contrast. Multiplanar CT image reconstructions and MIPs were obtained to evaluate the vascular anatomy. RADIATION DOSE REDUCTION: This exam was performed according to the departmental dose-optimization program which includes automated exposure control, adjustment of the mA and/or kV according to patient size and/or use of iterative reconstruction technique. CONTRAST:  121mL OMNIPAQUE IOHEXOL 350 MG/ML SOLN COMPARISON:  Chest CT dated 05/13/2016 and radiograph dated 05/01/2021. FINDINGS: Evaluation of this exam is limited due to respiratory motion artifact. Cardiovascular: There is no cardiomegaly or pericardial effusion. There is coronary vascular calcification. Mild atherosclerotic calcification of the thoracic aorta. No aneurysmal dilatation or dissection. Evaluation of the pulmonary arteries is limited due to respiratory motion artifact. No pulmonary artery embolus identified. Mediastinum/Nodes: No hilar or mediastinal adenopathy. The esophagus and the thyroid gland are grossly unremarkable. No mediastinal fluid collection. Lungs/Pleura: Bibasilar subpleural atelectasis and scarring. There is an 11 x 14 mm nodule with irregular and lobulated margins in the right upper lobe (67/6) concerning for malignancy. Clinical correlation and multidisciplinary consult is advised. There is no pleural effusion pneumothorax. Mucus secretions noted in the right lower lobe bronchus. The central airways remain patent. Upper Abdomen: Gallstones. Musculoskeletal: Degenerative changes of the spine. No acute osseous pathology. Old  healed sternal fracture. Review of the MIP images confirms the above findings. IMPRESSION: 1. No CT evidence of pulmonary artery embolus. 2. A 11 x 14 mm nodule with irregular and lobulated margins in the right upper lobe concerning for malignancy. Multidisciplinary consult is advised. 3. Cholelithiasis. 4. Aortic Atherosclerosis (ICD10-I70.0). Electronically Signed   By: Anner Crete M.D.   On: 05/02/2021 19:57   DG Chest Port 1 View  Result Date: 05/01/2021 CLINICAL DATA:  Shortness of breath EXAM: PORTABLE CHEST 1 VIEW COMPARISON:  12/13/2020 FINDINGS: The heart size and mediastinal contours are within normal limits. Aortic atherosclerosis. Both lungs are clear. The visualized skeletal structures are unremarkable. IMPRESSION: No active disease. Electronically Signed   By: Donavan Foil M.D.   On: 05/01/2021 16:24     Scheduled Meds:  albuterol  2.5 mg Nebulization QID   allopurinol  100 mg Oral Daily   apixaban  5 mg Oral BID   diclofenac Sodium  2 g Topical QID   fluticasone furoate-vilanterol  1 puff Inhalation Daily   And   umeclidinium bromide  1 puff Inhalation Daily   furosemide  40 mg Oral Daily   melatonin  3 mg Oral QHS   methylPREDNISolone (SOLU-MEDROL) injection  40 mg Intravenous Daily   metoprolol tartrate  25 mg Oral BID   pravastatin  20 mg Oral q1800   sodium chloride flush  3 mL Intravenous Q12H   Continuous Infusions:   LOS: 0 days   Time spent: 41  Nita Sells, MD [facility] To contact the attending provider between 7A-7P or the covering provider during after hours 7P-7A, please log into the web site [email or web address] and access using universal [place] password for that web site. If you do not have the password, please call the hospital operator.  05/03/2021, 3:57 PM

## 2021-05-03 NOTE — Progress Notes (Signed)
Pt requested to go to bathroom, encouraged ambulation after bathroom. Ambulated in hallway with RN. Pt was on 3LNC and used a walker. Pt desated down to low 80's during ambulation. Pt placed in chair after walk. Pt to do full assessment this afternoon. Pt comfortable in chair at this time. Call light in place.

## 2021-05-03 NOTE — Evaluation (Addendum)
Physical Therapy Evaluation Patient Details Name: Mitchell [name] MRN: [id number] DOB: 1940-07-11 Today's Date: 05/03/2021  History of Present Illness  RANDYN FELAND is a 81 y.o. male with medical history significant of vision pain, hyperlipidemia, depression, anxiety, hypertension, CAD status post ending, COPD, CKD 3, chronic respiratory failure on home oxygen, gout, a flutter who presents with worsening shortness of breath.  He's being treated for a COPD exacerbation.  CT without PE, but notable for RUL pulm nodule.  Clinical Impression  Patient ambulated x 120' with a cane and min guard. SPO2 on 3  L 95%, 1 stand   rest break.  Patient requires visual cues for hall walking due to low vision and obstacles. Patient states that he does get tangled in O2 tubing at home. Patient does not have 24/7 caregivers. Patient adamantly declined consideration of SNF. Pt admitted with above diagnosis.  Pt currently with functional limitations due to the deficits listed below (see PT Problem List). Pt will benefit from skilled PT to increase their independence and safety with mobility to allow discharge to the venue listed below.   Recommend increased assistance  for safety at home.        Recommendations for follow up therapy are one component of a multi-disciplinary discharge planning process, led by the attending physician.  Recommendations may be updated based on patient status, additional functional criteria and insurance authorization.  Follow Up Recommendations Home health PT    Assistance Recommended at Discharge Intermittent Supervision/Assistance  Patient can return home with the following  Assistance with cooking/housework;Assist for transportation    Equipment Recommendations Cane  Recommendations for Other Services    OT   Functional Status Assessment Patient has had a recent decline in their functional status and demonstrates the ability to make significant improvements in function in a  reasonable and predictable amount of time.     Precautions / Restrictions Precautions Precautions: Fall Precaution Comments: on 3 L  O2, legally blind      Mobility  Bed Mobility               General bed mobility comments: in recliner    Transfers Overall transfer level: Needs assistance Equipment used: Straight cane Transfers: Sit to/from Stand Sit to Stand: Supervision                Ambulation/Gait Ambulation/Gait assistance: Min guard Gait Distance (Feet): 120 Feet Assistive device: Straight cane Gait Pattern/deviations: Step-through pattern Gait velocity: decr     General Gait Details: patient directing therapist to pull tank behind him, patient's son ambulating along side of patient.  Stairs            Wheelchair Mobility    Modified Rankin (Stroke Patients Only)       Balance Overall balance assessment: History of Falls, Needs assistance Sitting-balance support: No upper extremity supported, Feet supported Sitting balance-Leahy Scale: Good     Standing balance support: No upper extremity supported Standing balance-Leahy Scale: Fair Standing balance comment: statixc, requires single limb to ambulte, can turn and look to each side and stop quickly and maintain.                             Pertinent Vitals/Pain Pain Assessment Pain Assessment: No/denies pain    Home Living Family/patient expects to be discharged to:: Private residence   Available Help at Discharge: Family;Available PRN/intermittently Type of Home: Apartment Home Access: Level entry  Home Layout: One level Home Equipment: Cane - single point;Shower seat;Grab bars - tub/shower Additional Comments: pt very set in his ways,    Prior Function Prior Level of Function : Independent/Modified Independent             Mobility Comments: ambulates in his apartment , has 47' of O2 line, gets tangled he says ADLs Comments: ex wife takes him to  grocery store, etc. Son lives out of town     Engineer, manufacturing Dominance   Dominant Hand: Right    Extremity/Trunk Assessment   Upper Extremity Assessment Upper Extremity Assessment: Overall WFL for tasks assessed    Lower Extremity Assessment Lower Extremity Assessment: Generalized weakness    Cervical / Trunk Assessment Cervical / Trunk Assessment: Normal  Communication   Communication: No difficulties;HOH  Cognition Arousal/Alertness: Awake/alert Behavior During Therapy: WFL for tasks assessed/performed Overall Cognitive Status: Within Functional Limits for tasks assessed                                 General Comments: tearful several times.        General Comments      Exercises     Assessment/Plan    PT Assessment Patient needs continued PT services  PT Problem List Decreased strength;Decreased balance;Decreased knowledge of precautions       PT Treatment Interventions DME instruction;Therapeutic activities;Gait training;Therapeutic exercise;Patient/family education;Balance training;Functional mobility training    PT Goals (Current goals can be found in the Care Plan section)  Acute Rehab PT Goals Patient Stated Goal: I am going home. PT Goal Formulation: With patient/family Time For Goal Achievement: 05/17/21 Potential to Achieve Goals: Fair    Frequency Min 3X/week     Co-evaluation               AM-PAC PT "6 Clicks" Mobility  Outcome Measure Help needed turning from your back to your side while in a flat bed without using bedrails?: A Little Help needed moving from lying on your back to sitting on the side of a flat bed without using bedrails?: A Little Help needed moving to and from a bed to a chair (including a wheelchair)?: A Little Help needed standing up from a chair using your arms (e.g., wheelchair or bedside chair)?: A Little Help needed to walk in hospital room?: A Little Help needed climbing 3-5 steps with a railing? : A  Lot 6 Click Score: 17    End of Session Equipment Utilized During Treatment: Gait belt Activity Tolerance: Patient tolerated treatment well Patient left: in chair;with call bell/phone within reach;with family/visitor present Nurse Communication: Mobility status PT Visit Diagnosis: Unsteadiness on feet (R26.81);Difficulty in walking, not elsewhere classified (R26.2)    Time: [id number] PT Time Calculation (min) (ACUTE ONLY): 29 min   Charges:   PT Evaluation $PT Eval Low Complexity: 1 Low PT Treatments $Gait Training: 8-22 mins        Tresa Endo PT Acute Rehabilitation Services Pager [phone] Office [phone]   Claretha Cooper 05/03/2021, 4:51 PM

## 2021-05-04 DIAGNOSIS — J441 Chronic obstructive pulmonary disease with (acute) exacerbation: Secondary | ICD-10-CM | POA: Diagnosis present

## 2021-05-04 DIAGNOSIS — I484 Atypical atrial flutter: Secondary | ICD-10-CM | POA: Diagnosis present

## 2021-05-04 DIAGNOSIS — Z961 Presence of intraocular lens: Secondary | ICD-10-CM | POA: Diagnosis present

## 2021-05-04 DIAGNOSIS — I252 Old myocardial infarction: Secondary | ICD-10-CM | POA: Diagnosis not present

## 2021-05-04 DIAGNOSIS — Z8249 Family history of ischemic heart disease and other diseases of the circulatory system: Secondary | ICD-10-CM | POA: Diagnosis not present

## 2021-05-04 DIAGNOSIS — E785 Hyperlipidemia, unspecified: Secondary | ICD-10-CM | POA: Diagnosis present

## 2021-05-04 DIAGNOSIS — I25119 Atherosclerotic heart disease of native coronary artery with unspecified angina pectoris: Secondary | ICD-10-CM | POA: Diagnosis present

## 2021-05-04 DIAGNOSIS — Z79899 Other long term (current) drug therapy: Secondary | ICD-10-CM | POA: Diagnosis not present

## 2021-05-04 DIAGNOSIS — Z20822 Contact with and (suspected) exposure to covid-19: Secondary | ICD-10-CM | POA: Diagnosis present

## 2021-05-04 DIAGNOSIS — J9611 Chronic respiratory failure with hypoxia: Secondary | ICD-10-CM | POA: Diagnosis present

## 2021-05-04 DIAGNOSIS — I4821 Permanent atrial fibrillation: Secondary | ICD-10-CM | POA: Diagnosis present

## 2021-05-04 DIAGNOSIS — Z9842 Cataract extraction status, left eye: Secondary | ICD-10-CM | POA: Diagnosis not present

## 2021-05-04 DIAGNOSIS — F5105 Insomnia due to other mental disorder: Secondary | ICD-10-CM | POA: Diagnosis present

## 2021-05-04 DIAGNOSIS — N400 Enlarged prostate without lower urinary tract symptoms: Secondary | ICD-10-CM | POA: Diagnosis present

## 2021-05-04 DIAGNOSIS — Z7901 Long term (current) use of anticoagulants: Secondary | ICD-10-CM | POA: Diagnosis not present

## 2021-05-04 DIAGNOSIS — Z9841 Cataract extraction status, right eye: Secondary | ICD-10-CM | POA: Diagnosis not present

## 2021-05-04 DIAGNOSIS — F32A Depression, unspecified: Secondary | ICD-10-CM | POA: Diagnosis present

## 2021-05-04 DIAGNOSIS — Z955 Presence of coronary angioplasty implant and graft: Secondary | ICD-10-CM | POA: Diagnosis not present

## 2021-05-04 DIAGNOSIS — I5032 Chronic diastolic (congestive) heart failure: Secondary | ICD-10-CM | POA: Diagnosis present

## 2021-05-04 DIAGNOSIS — N1831 Chronic kidney disease, stage 3a: Secondary | ICD-10-CM | POA: Diagnosis present

## 2021-05-04 DIAGNOSIS — M109 Gout, unspecified: Secondary | ICD-10-CM | POA: Diagnosis present

## 2021-05-04 DIAGNOSIS — I130 Hypertensive heart and chronic kidney disease with heart failure and stage 1 through stage 4 chronic kidney disease, or unspecified chronic kidney disease: Secondary | ICD-10-CM | POA: Diagnosis present

## 2021-05-04 DIAGNOSIS — F419 Anxiety disorder, unspecified: Secondary | ICD-10-CM | POA: Diagnosis present

## 2021-05-04 DIAGNOSIS — Z87891 Personal history of nicotine dependence: Secondary | ICD-10-CM | POA: Diagnosis not present

## 2021-05-04 LAB — COMPREHENSIVE METABOLIC PANEL
ALT: 11 U/L (ref 0–44)
AST: 15 U/L (ref 15–41)
Albumin: 3.2 g/dL — ABNORMAL LOW (ref 3.5–5.0)
Alkaline Phosphatase: 49 U/L (ref 38–126)
Anion gap: 8 (ref 5–15)
BUN: 35 mg/dL — ABNORMAL HIGH (ref 8–23)
CO2: 30 mmol/L (ref 22–32)
Calcium: 8.7 mg/dL — ABNORMAL LOW (ref 8.9–10.3)
Chloride: 99 mmol/L (ref 98–111)
Creatinine, Ser: 1.32 mg/dL — ABNORMAL HIGH (ref 0.61–1.24)
GFR, Estimated: 54 mL/min — ABNORMAL LOW (ref >60)
Glucose, Bld: 139 mg/dL — ABNORMAL HIGH (ref 70–99)
Potassium: 4.1 mmol/L (ref 3.5–5.1)
Sodium: 137 mmol/L (ref 135–145)
Total Bilirubin: 0.4 mg/dL (ref 0.3–1.2)
Total Protein: 5.5 g/dL — ABNORMAL LOW (ref 6.5–8.1)

## 2021-05-04 LAB — CBC WITH DIFFERENTIAL/PLATELET
Abs Immature Granulocytes: 0.11 10*3/uL — ABNORMAL HIGH (ref 0.00–0.07)
Basophils Absolute: 0 10*3/uL (ref 0.0–0.1)
Basophils Relative: 0 %
Eosinophils Absolute: 0 10*3/uL (ref 0.0–0.5)
Eosinophils Relative: 0 %
HCT: 48.2 % (ref 39.0–52.0)
Hemoglobin: 15.8 g/dL (ref 13.0–17.0)
Immature Granulocytes: 1 %
Lymphocytes Relative: 6 %
Lymphs Abs: 0.9 10*3/uL (ref 0.7–4.0)
MCH: 29.6 pg (ref 26.0–34.0)
MCHC: 32.8 g/dL (ref 30.0–36.0)
MCV: 90.3 fL (ref 80.0–100.0)
Monocytes Absolute: 1 10*3/uL (ref 0.1–1.0)
Monocytes Relative: 8 %
Neutro Abs: 11.8 10*3/uL — ABNORMAL HIGH (ref 1.7–7.7)
Neutrophils Relative %: 85 %
Platelets: 188 10*3/uL (ref 150–400)
RBC: 5.34 MIL/uL (ref 4.22–5.81)
RDW: 14.5 % (ref 11.5–15.5)
WBC: 13.8 10*3/uL — ABNORMAL HIGH (ref 4.0–10.5)
nRBC: 0 % (ref 0.0–0.2)

## 2021-05-04 NOTE — TOC Progression Note (Signed)
Transition of Care [facility]) - Progression Note    Patient Details  Name: Mitchell [name] MRN: [id number] Date of Birth: 1940-06-11  Transition [facility]) CM/SW Contact  Marytza Grandpre, Juliann Pulse, RN Phone Number: 05/04/2021, 4:01 PM  Clinical Narrative:  [facility] neb machine to deliver to rm prior d/c.     Expected Discharge Plan: [place] Barriers to Discharge: Continued Medical Work up  Expected Discharge Plan and Services Expected Discharge Plan: [place]                         DME Arranged: Nebulizer machine DME Agency: [employer] Date DME Agency Contacted: 05/04/21 Time DME Agency Contacted: [phone] Representative spoke with at DME Agency: [place] ([place]) Interventions    Readmission Risk Interventions No flowsheet data found.

## 2021-05-04 NOTE — Progress Notes (Signed)
Physical Therapy Treatment Patient Details Name: Mitchell [name] MRN: [id number] DOB: [birth year] Today's Date: 05/04/2021   History of Present Illness Mitchell [name] is a 81 y.o. male with medical history significant of vision pain, hyperlipidemia, depression, anxiety, hypertension, CAD status post ending, COPD, CKD 3, chronic respiratory failure on home oxygen, gout, a flutter who presents with worsening shortness of breath.  He's being treated for a COPD exacerbation.  CT without PE, but notable for RUL pulm nodule.    PT Comments     Patient eager to ambulate, tolerated 300' SPO2 on 3 L 100%, min guard for safety in hallway. Patient will benefit from frequent supervision, assistance with meals.  Recommend HHPT and a single point cane.  Recommendations for follow up therapy are one component of a multi-disciplinary discharge planning process, led by the attending physician.  Recommendations may be updated based on patient status, additional functional criteria and insurance authorization.  Follow Up Recommendations  Home health PT     Assistance Recommended at Discharge Frequent or constant Supervision/Assistance  Patient can return home with the following Assistance with cooking/housework;Assist for transportation   Equipment Recommendations  Cane    Recommendations for Other Services       Precautions / Restrictions Precautions Precautions: Fall Precaution Comments: on 3 L  O2, legally blind Restrictions Weight Bearing Restrictions: No     Mobility  Bed Mobility               General bed mobility comments: in recliner    Transfers Overall transfer level: Needs assistance                      Ambulation/Gait Ambulation/Gait assistance: Min guard Gait Distance (Feet): 300 Feet Assistive device: Straight cane Gait Pattern/deviations: Step-through pattern Gait velocity: decr Gait velocity interpretation: <1.31 ft/sec, indicative of household  ambulator   General Gait Details: stood and rested x 5 minutes after 150'   Stairs             Wheelchair Mobility    Modified Rankin (Stroke Patients Only)       Balance Overall balance assessment: Mild deficits observed, not formally tested                                          Cognition Arousal/Alertness: Awake/alert Behavior During Therapy: WFL for tasks assessed/performed Overall Cognitive Status: Within Functional Limits for tasks assessed                                 General Comments: tearful several times.        Exercises      General Comments        Pertinent Vitals/Pain Pain Assessment Pain Assessment: No/denies pain    Home Living Family/patient expects to be discharged to:: Private residence   Available Help at Discharge: Family;Available PRN/intermittently Type of Home: Apartment Home Access: Level entry       Home Layout: One level Home Equipment: Cane - single point;Shower seat;Grab bars - tub/shower Additional Comments: Has some difficulty with managing meals - used to have meals on wheels but they had a falling out. needs a a new microwave. ..    Prior Function            PT Goals (current goals can now  be found in the care plan section) Progress towards PT goals: Progressing toward goals    Frequency    Min 3X/week      PT Plan Current plan remains appropriate    Co-evaluation   Reason for Co-Treatment: For patient/therapist safety          AM-PAC PT "6 Clicks" Mobility   Outcome Measure  Help needed turning from your back to your side while in a flat bed without using bedrails?: A Little Help needed moving from lying on your back to sitting on the side of a flat bed without using bedrails?: A Little Help needed moving to and from a bed to a chair (including a wheelchair)?: A Little Help needed standing up from a chair using your arms (e.g., wheelchair or bedside chair)?:  A Little Help needed to walk in hospital room?: A Little Help needed climbing 3-5 steps with a railing? : A Lot 6 Click Score: 17    End of Session Equipment Utilized During Treatment: Gait belt Activity Tolerance: Patient tolerated treatment well Patient left: in chair;with call bell/phone within reach Nurse Communication: Mobility status PT Visit Diagnosis: Unsteadiness on feet (R26.81);Difficulty in walking, not elsewhere classified (R26.2)     Time: [id number] PT Time Calculation (min) (ACUTE ONLY): 44 min  Charges:  $Gait Training: 23-37 mins                     Tresa Endo PT Acute Rehabilitation Services Pager [phone] Office [phone]    Claretha Cooper 05/04/2021, 12:59 PM

## 2021-05-04 NOTE — Progress Notes (Signed)
PROGRESS NOTE  Mitchell [name]  [id number] DOB: May 14, 1940 DOA: 05/01/2021 PCP: Lawerance Cruel, MD  Brief Narrative:   57 m CAD status post MI--stent to LAD in 2007,  HTN, dyslipidemia,  Perm Afib/flutter on Apixaban COPD on Oxygen 3L baseline  CKD stage 3 with baseline Cr of 1.2 Prior ETOH BPH  Presented to ED 1/16 SOB DOE 05/01/2021-since Thanksgiving has had this-also intermittent chest pain Evaluated 2 weeks prior to admission by EMS but refused ED transport Intermittently takes Lasix-does not take if he has to travel outside of his home   BNP normal, troponin normal electrolytes and CBC without issue-CXR negative  BUN/creatinine 21/1.4-->25/1.26 WBC 9.0--grams and 7.8 Hemoglobin 16 Eventual CT chest showed RUL nodule 11 X 14 mm irregular lobulated margins  Hospital-Problem based course  Acute exacerbation of COPD Chronic respiratory failure 3 L baseline received Solu-Medrol 40 +1 dose of prednisone this a.m. ? Overall is breathing better continue albuterol 2.5 every 2 as needed, Breo Ellipta/Incruse Ellipta daily We will order outpatient DME nebulizer for him If he continues to maintain stability he may be able to discharge with home health in a.m. 1/20 CAD status post MI with LAD stent 2007, HLD Continue metoprolol 25 twice daily, Pravachol 20 daily Permanent A. fib flutter on Eliquis CHADS2 score >4 Continue Eliquis 5 twice daily continued metoprolol as above CKD 3 Caution with meds-periodic labs Right upper lobe nodule 11 X 14 mm Outpatient pulmonary eval--no nodule present on prior chest CT 2013 Will need to be seen by pulmonary outpatient-I'll CC Dr. Erin Fulling re: the needs as above for semi-urgent follow-up had a long chat with the son and his wife at the bedside about this diagnosis on 1/18--Patient is NOT AWARE of diagnosis and they prefer to have him be aware of this @ OP visit   DVT prophylaxis: Eliquis Code Status: Full Family Communication: Discussed  on telephone ex-wife Jackson Latino [phone]  Disposition:  Status is: Observation  The patient will require care spanning > 2 midnights and should be moved to inpatient because: Not ready to be discharged  Consultants:  None  Procedures: No  Antimicrobials: No   Subjective:  walked today Feels fair He isn't feeling as badly as yesterday  Objective: Vitals:   05/03/21 2048 05/04/21 0529 05/04/21 0618 05/04/21 1242  BP: (!) 145/79 135/86  135/87  Pulse: 69 83  66  Resp:  20  19  Temp: 97.6 F (36.4 C) (!) 97.5 F (36.4 C)  98.1 F (36.7 C)  TempSrc: Oral Oral  Oral  SpO2:  97% 94% 93%  Weight:      Height:        Intake/Output Summary (Last 24 hours) at 05/04/2021 1726 Last data filed at 05/04/2021 0800 Gross per 24 hour  Intake 360 ml  Output 200 ml  Net 160 ml    Filed Weights   05/02/21 0022 05/02/21 0441 05/03/21 0500  Weight: 103.6 kg 103.6 kg 105.5 kg    Examination:  EOMI NCAT thick neck Mallampati 4 No icterus no pallor no JVD S1-S2 no murmur no rub no gallop Chest seems relatively clearer than prior Abdomen soft no rebound no guarding No LE edema Neurologically intact t   Data Reviewed: personally reviewed   CBC    Component Value Date/Time   WBC 13.8 (H) 05/04/2021 0406   RBC 5.34 05/04/2021 0406   HGB 15.8 05/04/2021 0406   HGB 15.9 01/30/2021 0000   HCT 48.2 05/04/2021 0406   HCT  46.1 01/30/2021 0000   PLT 188 05/04/2021 0406   PLT 176 01/30/2021 0000   MCV 90.3 05/04/2021 0406   MCV 87 01/30/2021 0000   MCH 29.6 05/04/2021 0406   MCHC 32.8 05/04/2021 0406   RDW 14.5 05/04/2021 0406   RDW 12.9 01/30/2021 0000   LYMPHSABS 0.9 05/04/2021 0406   MONOABS 1.0 05/04/2021 0406   EOSABS 0.0 05/04/2021 0406   BASOSABS 0.0 05/04/2021 0406   CMP Latest Ref Rng & Units 05/04/2021 05/02/2021 05/01/2021  Glucose 70 - 99 mg/dL 139(H) 252(H) 104(H)  BUN 8 - 23 mg/dL 35(H) 25(H) 21  Creatinine 0.61 - 1.24 mg/dL 1.32(H) 1.26(H) 1.42(H)   Sodium 135 - 145 mmol/L 137 136 139  Potassium 3.5 - 5.1 mmol/L 4.1 3.9 4.0  Chloride 98 - 111 mmol/L 99 99 98  CO2 22 - 32 mmol/L 30 29 35(H)  Calcium 8.9 - 10.3 mg/dL 8.7(L) 9.1 8.9  Total Protein 6.5 - 8.1 g/dL 5.5(L) 5.8(L) 6.3(L)  Total Bilirubin 0.3 - 1.2 mg/dL 0.4 0.6 0.9  Alkaline Phos 38 - 126 U/L 49 67 72  AST 15 - 41 U/L 15 17 17   ALT 0 - 44 U/L 11 13 13      Radiology Studies: CT Angio Chest Pulmonary Embolism (PE) W or WO Contrast  Result Date: 05/02/2021 CLINICAL DATA:  Chest pain and shortness of breath. Concern for pulmonary embolism. EXAM: CT ANGIOGRAPHY CHEST WITH CONTRAST TECHNIQUE: Multidetector CT imaging of the chest was performed using the standard protocol during bolus administration of intravenous contrast. Multiplanar CT image reconstructions and MIPs were obtained to evaluate the vascular anatomy. RADIATION DOSE REDUCTION: This exam was performed according to the departmental dose-optimization program which includes automated exposure control, adjustment of the mA and/or kV according to patient size and/or use of iterative reconstruction technique. CONTRAST:  188mL OMNIPAQUE IOHEXOL 350 MG/ML SOLN COMPARISON:  Chest CT dated 05/13/2016 and radiograph dated 05/01/2021. FINDINGS: Evaluation of this exam is limited due to respiratory motion artifact. Cardiovascular: There is no cardiomegaly or pericardial effusion. There is coronary vascular calcification. Mild atherosclerotic calcification of the thoracic aorta. No aneurysmal dilatation or dissection. Evaluation of the pulmonary arteries is limited due to respiratory motion artifact. No pulmonary artery embolus identified. Mediastinum/Nodes: No hilar or mediastinal adenopathy. The esophagus and the thyroid gland are grossly unremarkable. No mediastinal fluid collection. Lungs/Pleura: Bibasilar subpleural atelectasis and scarring. There is an 11 x 14 mm nodule with irregular and lobulated margins in the right upper lobe  (67/6) concerning for malignancy. Clinical correlation and multidisciplinary consult is advised. There is no pleural effusion pneumothorax. Mucus secretions noted in the right lower lobe bronchus. The central airways remain patent. Upper Abdomen: Gallstones. Musculoskeletal: Degenerative changes of the spine. No acute osseous pathology. Old healed sternal fracture. Review of the MIP images confirms the above findings. IMPRESSION: 1. No CT evidence of pulmonary artery embolus. 2. A 11 x 14 mm nodule with irregular and lobulated margins in the right upper lobe concerning for malignancy. Multidisciplinary consult is advised. 3. Cholelithiasis. 4. Aortic Atherosclerosis (ICD10-I70.0). Electronically Signed   By: Anner Crete M.D.   On: 05/02/2021 19:57     Scheduled Meds:  albuterol  2.5 mg Nebulization QID   allopurinol  100 mg Oral Daily   apixaban  5 mg Oral BID   diclofenac  1 patch Transdermal BID   fluticasone furoate-vilanterol  1 puff Inhalation Daily   And   umeclidinium bromide  1 puff Inhalation Daily   furosemide  40 mg Oral Daily   melatonin  3 mg Oral QHS   metoprolol tartrate  25 mg Oral BID   pravastatin  20 mg Oral q1800   predniSONE  60 mg Oral QAC breakfast   sodium chloride flush  3 mL Intravenous Q12H   Continuous Infusions:   LOS: 0 days   Time spent: [place], MD [facility] To contact the attending provider between 7A-7P or the covering provider during after hours 7P-7A, please log into the web site [email or web address] and access using universal [place] password for that web site. If you do not have the password, please call the hospital operator.  05/04/2021, 5:26 PM

## 2021-05-04 NOTE — Evaluation (Signed)
Occupational Therapy Evaluation Patient Details Name: Mitchell [name] MRN: [id number] DOB: 1940-10-18 Today's Date: 05/04/2021   History of Present Illness Mitchell [name] is a 81 y.o. male with medical history significant of vision pain, hyperlipidemia, depression, anxiety, hypertension, CAD status post ending, COPD, CKD 3, chronic respiratory failure on home oxygen, gout, a flutter who presents with worsening shortness of breath.  He's being treated for a COPD exacerbation.  CT without PE, but notable for RUL pulm nodule.   Clinical Impression   Mitchell [name] is an 81 year old man with decreased activity tolerance secondary to COPD and low vision. On evaluation he is overall min guard for all mobility and ADLs. He is limited by low vision in hospital environment. Will benefit from acute care OT services to learn compensatory strategies as needed and to maintain or improve ADLs to supervision. Would benefit from [facility] OT at discharge for home evaluation and learn compensatory strategies at home as well.       Recommendations for follow up therapy are one component of a multi-disciplinary discharge planning process, led by the attending physician.  Recommendations may be updated based on patient status, additional functional criteria and insurance authorization.   Follow Up Recommendations  Home health OT    Assistance Recommended at Discharge PRN  Patient can return home with the following Assistance with cooking/housework;Direct supervision/assist for financial management;Direct supervision/assist for medications management    Functional Status Assessment  Patient has had a recent decline in their functional status and demonstrates the ability to make significant improvements in function in a reasonable and predictable amount of time.  Equipment Recommendations   (Defer to [facility] [employer])    Recommendations for Other Services       Precautions / Restrictions Precautions Precautions:  Fall Precaution Comments: on 3 L  O2, legally blind Restrictions Weight Bearing Restrictions: No      Mobility Bed Mobility               General bed mobility comments: in recliner    Transfers Overall transfer level: Needs assistance Equipment used: Straight cane Transfers: Sit to/from Stand Sit to Stand: Supervision                  Balance Overall balance assessment: Mild deficits observed, not formally tested                                         ADL either performed or assessed with clinical judgement   ADL Overall ADL's : Needs assistance/impaired Eating/Feeding: Set up   Grooming: Set up   Upper Body Bathing: Set up   Lower Body Bathing: Min guard;Sit to/from stand   Upper Body Dressing : Set up   Lower Body Dressing: Set up Lower Body Dressing Details (indicate cue type and reason): able to don shoes - typically uses a wooden spoon to assist with donning shoes Toilet Transfer: Min guard;Regular Toilet;Grab bars   Toileting- Clothing Manipulation and Hygiene: Min guard;Sit to/from stand       Functional mobility during ADLs: Min guard;Cane General ADL Comments: Ambulated with cane on 2 L [place]. Overall min guard for safety     Vision Baseline Vision/History: 6 Macular Degeneration Ability to See in Adequate Light: 3 Highly impaired Patient Visual Report: No change from baseline       Perception     Praxis  Pertinent Vitals/Pain Pain Assessment Pain Assessment: No/denies pain     Hand Dominance Right   Extremity/Trunk Assessment Upper Extremity Assessment Upper Extremity Assessment: Overall WFL for tasks assessed   Lower Extremity Assessment Lower Extremity Assessment: Defer to PT evaluation   Cervical / Trunk Assessment Cervical / Trunk Assessment: Normal   Communication Communication Communication: No difficulties;HOH   Cognition Arousal/Alertness: Awake/alert Behavior During Therapy: WFL for tasks  assessed/performed Overall Cognitive Status: Within Functional Limits for tasks assessed                                       General Comments       Exercises     Shoulder Instructions      Home Living Family/patient expects to be discharged to:: Private residence   Available Help at Discharge: Family;Available PRN/intermittently Type of Home: Apartment Home Access: Level entry     Home Layout: One level     Bathroom Shower/Tub: Chief Strategy Officer: Handicapped height Bathroom Accessibility: Yes   Home Equipment: Cane - single point;Shower seat;Grab bars - tub/shower   Additional Comments: Has some difficulty with managing meals - used to have meals on wheels but they had a falling out. needs a a new microwave. ..      Prior Functioning/Environment Prior Level of Function : Independent/Modified Independent             Mobility Comments: ambulates in his apartment , has 5' of O2 line, gets tangled he says ADLs Comments: ex wife takes him to grocery store, etc. Son lives out of town        OT Problem List: Decreased activity tolerance;Cardiopulmonary status limiting activity;Decreased knowledge of use of DME or AE      OT Treatment/Interventions: Self-care/ADL training;DME and/or AE instruction;Patient/family education;Visual/perceptual remediation/compensation    OT Goals(Current goals can be found in the care plan section) Acute Rehab OT Goals Patient Stated Goal: to remain independent OT Goal Formulation: With patient Time For Goal Achievement: 05/25/21 Potential to Achieve Goals: Good  OT Frequency: Min 1X/week    Co-evaluation PT/OT/SLP Co-Evaluation/Treatment: Yes Reason for Co-Treatment: For patient/therapist safety          AM-PAC OT "6 Clicks" Daily Activity     Outcome Measure Help from another person eating meals?: A Little Help from another person taking care of personal grooming?: A Little Help from  another person toileting, which includes using toliet, bedpan, or urinal?: A Little Help from another person bathing (including washing, rinsing, drying)?: A Little Help from another person to put on and taking off regular upper body clothing?: A Little Help from another person to put on and taking off regular lower body clothing?: A Little 6 Click Score: 18   End of Session Equipment Utilized During Treatment: Gait belt;Oxygen Nurse Communication: Mobility status  Activity Tolerance: Patient tolerated treatment well Patient left: in chair;with call bell/phone within reach  OT Visit Diagnosis: Low vision, both eyes (H54.2)                Time: 1517-6160 OT Time Calculation (min): 33 min Charges:  OT General Charges $OT Visit: 1 Visit OT Evaluation $OT Eval Low Complexity: 1 Low  Samier Jaco, OTR/L Acute Care Rehab Services  Office [phone] Pager: [phone]   Kelli Churn 05/04/2021, 9:19 AM

## 2021-05-05 DIAGNOSIS — J441 Chronic obstructive pulmonary disease with (acute) exacerbation: Secondary | ICD-10-CM | POA: Diagnosis not present

## 2021-05-05 MED ORDER — DICLOFENAC EPOLAMINE 1.3 % EX PTCH: 1.0000 | MEDICATED_PATCH | Freq: Two times a day (BID) | CUTANEOUS | 0 refills | Status: DC

## 2021-05-05 MED ORDER — ALBUTEROL SULFATE (2.5 MG/3ML) 0.083% IN NEBU: 2.5000 mg | INHALATION_SOLUTION | Freq: Four times a day (QID) | RESPIRATORY_TRACT | 12 refills | Status: DC

## 2021-05-05 MED ORDER — PREDNISONE 20 MG PO TABS: 60.0000 mg | ORAL_TABLET | Freq: Every day | ORAL | 0 refills | Status: DC

## 2021-05-05 NOTE — Progress Notes (Signed)
Pt dc WNL and stable, via wheelchair by RN. Went over AVS instructions with pt and family at bedside. Pt verbalized understanding with no further question Family aware of follow up with pulmonologist.

## 2021-05-05 NOTE — Progress Notes (Signed)
°   05/05/21 0539  Vitals  BP (!) 170/89   On-call MD notified

## 2021-05-05 NOTE — Discharge Summary (Signed)
Physician Discharge Summary  Mitchell [name] [id number] DOB: 1941/01/12 DOA: 05/01/2021  PCP: Lawerance Cruel, MD  Admit date: 05/01/2021 Discharge date: 05/05/2021  Time spent: 37 minutes  Recommendations for Outpatient Follow-up:  Needs OP follow-up Pulmonary DR. Dewald--I have cc him Patient is NOT AWARE of his pulmonary nodule diagnosis.  Family requests OP discussion of this when he visits Pulmonary Get cbc, cmet 1 week--consider nephology referral  Discharge Diagnoses:  MAIN problem for hospitalization   COPD exacerbation Pulmonary nodule  Please see below for itemized issues addressed in HOpsital- refer to other progress notes for clarity if needed  Discharge Condition: improved  Diet recommendation:  hh low salt  Filed Weights   05/02/21 0441 05/03/21 0500 05/05/21 0431  Weight: 103.6 kg 105.5 kg 108.3 kg    History of present illness:  19 m CAD status post MI--stent to LAD in 2007,  HTN, dyslipidemia,  Perm Afib/flutter on Apixaban COPD on Oxygen 3L baseline  CKD stage 3 with baseline Cr of 1.2 Prior ETOH BPH   Presented to ED 1/16 SOB DOE 05/01/2021-since Thanksgiving has had this-also intermittent chest pain Evaluated 2 weeks prior to admission by EMS but refused ED transport Intermittently takes Lasix-does not take if he has to travel outside of his home    BNP normal, troponin normal electrolytes and CBC without issue-CXR negative   Admit labs BUN/creatinine 21/1.4-->25/1.26 WBC 9.0--grams and 7.8 Hemoglobin 16  Eventual CT chest showed RUL nodule 11 X 14 mm irregular lobulated margins  Hospital Course:  Acute exacerbation of COPD Chronic respiratory failure 3 L baseline received Solu-Medrol 40 ---transitioned to 7 more days of prednisone on d/c Get DME nebulizer for albuterol 2.5 every 2 as needed, Breo Ellipta/Incruse Ellipta daily CAD status post MI with LAD stent 2007, HLD Continue metoprolol 25 twice daily, Pravachol 20 daily Permanent  A. fib flutter on Eliquis CHADS2 score >4 Continue Eliquis 5 twice daily continued metoprolol as above CKD 3 Caution with meds-periodic labs Right upper lobe nodule 11 X 14 mm Outpatient pulmonary eval--no nodule present on prior chest CT 2013 Will need to be seen by pulmonary outpatient-I'll CC Dr. Erin Fulling re: the needs as above for semi-urgent follow-up had a long chat with the son and his wife at the bedside about this diagnosis on 1/18--Patient is NOT AWARE of diagnosis and they prefer to have him be aware of this @ OP visit   Discharge Exam: Vitals:   05/05/21 0926 05/05/21 0930  BP:    Pulse:    Resp:    Temp:    SpO2: 95% 95%    Subj on day of d/c   Awake alert eating sandwich Talking in long paragraphs, no obvious SOB and walked halls this am   General Exam on discharge  Eomi ncat  Cta b no rales no rhonchi no wheeze Abd soft nt nd no rebound no guard Neuro intact no focal defciit  Discharge Instructions   Discharge Instructions     Diet - low sodium heart healthy   Complete by: As directed    Discharge instructions   Complete by: As directed    Please continue the steroids as directed Get cbc and Chem -12 1 week Make sure that you follow up with Dr. Erin Fulling, your Lung Doc.  He is the one that will discuss lung testign etc in the outpatient setting   Increase activity slowly   Complete by: As directed       Allergies as of 05/05/2021  Reactions   Lipitor [atorvastatin] Other (See Comments)   "just paralyzed me; I can't take it"   Statins Other (See Comments)   "paralyzed me; I can't take it"   Rosuvastatin    Leg aches        Medication List     STOP taking these medications    doxycycline 100 MG capsule Commonly known as: VIBRAMYCIN       TAKE these medications    acetaminophen 500 MG tablet Commonly known as: TYLENOL Take 500 mg by mouth every 6 (six) hours as needed.   albuterol (2.5 MG/3ML) 0.083% nebulizer  solution Commonly known as: PROVENTIL Take 3 mLs (2.5 mg total) by nebulization 4 (four) times daily. What changed: You were already taking a medication with the same name, and this prescription was added. Make sure you understand how and when to take each.   albuterol 108 (90 Base) MCG/ACT inhaler Commonly known as: VENTOLIN HFA Inhale 2 puffs into the lungs every 6 (six) hours as needed for wheezing. Reported on 05/04/2015 What changed: Another medication with the same name was added. Make sure you understand how and when to take each.   allopurinol 100 MG tablet Commonly known as: ZYLOPRIM Take 100 mg by mouth daily.   diclofenac 1.3 % Ptch Commonly known as: FLECTOR Place 1 patch onto the skin 2 (two) times daily. If not affordable, pls substitute OTC Voltaren Gel or cream--thanks   Eliquis 5 MG Tabs tablet Generic drug: apixaban TAKE 1 TABLET (5 MG TOTAL) BY MOUTH 2 (TWO) TIMES DAILY. NEED ANNUAL MD APPT FOR REFILLS   finasteride 5 MG tablet Commonly known as: PROSCAR Take 5 mg by mouth daily. Reported on 05/04/2015   furosemide 40 MG tablet Commonly known as: LASIX Take 40 mg by mouth 2 (two) times daily.   metoprolol tartrate 25 MG tablet Commonly known as: LOPRESSOR TAKE 1 TABLET TWICE DAILY   mometasone-formoterol 100-5 MCG/ACT Aero Commonly known as: DULERA Inhale 2 puffs into the lungs 2 (two) times daily.   mupirocin ointment 2 % Commonly known as: BACTROBAN Apply 1 application topically 2 (two) times daily.   nitroGLYCERIN 0.4 MG SL tablet Commonly known as: NITROSTAT Place 1 tablet (0.4 mg total) under the tongue every 5 (five) minutes as needed. Reported on 05/04/2015   potassium chloride 10 MEQ tablet Commonly known as: Klor-Con 10 Take 1 tablet (10 mEq total) by mouth daily.   pravastatin 20 MG tablet Commonly known as: PRAVACHOL TAKE 1/2 TABLET EVERY EVENING   predniSONE 20 MG tablet Commonly known as: DELTASONE Take 3 tablets (60 mg total) by  mouth daily before breakfast. Start taking on: May 06, 2021   QUEtiapine 50 MG tablet Commonly known as: SEROQUEL Take 50 mg by mouth at bedtime.   Trelegy Ellipta 100-62.5-25 MCG/ACT Aepb Generic drug: Fluticasone-Umeclidin-Vilant Inhale 1 puff into the lungs daily.               Durable Medical Equipment  (From admission, onward)           Start     Ordered   05/05/21 1107  For home use only DME Cane  Once        05/05/21 1106   05/04/21 1425  For home use only DME Nebulizer/meds  Once       Question Answer Comment  Patient needs a nebulizer to treat with the following condition COPD (chronic obstructive pulmonary disease) with emphysema ([place])   Length of Need Lifetime  05/04/21 1425           Allergies  Allergen Reactions   Lipitor [Atorvastatin] Other (See Comments)    "just paralyzed me; I can't take it"   Statins Other (See Comments)    "paralyzed me; I can't take it"   Rosuvastatin     Leg aches    [facility] Follow up.   Specialty: [place] Why: [facility] physical therapy Contact information: [place] [address] [facility] Follow up.   Why: nebulizer machine,cane Contact information: [place] [address] [phone]                  The results of significant diagnostics from this hospitalization (including imaging, microbiology, ancillary and laboratory) are listed below for reference.    Significant Diagnostic Studies: CT Angio Chest Pulmonary Embolism (PE) W or WO Contrast  Result Date: 05/02/2021 CLINICAL DATA:  Chest pain and shortness of breath. Concern for pulmonary embolism. EXAM: CT ANGIOGRAPHY CHEST WITH CONTRAST TECHNIQUE: Multidetector CT imaging of the chest was performed using the standard protocol during bolus administration of intravenous contrast. Multiplanar CT image  reconstructions and MIPs were obtained to evaluate the vascular anatomy. RADIATION DOSE REDUCTION: This exam was performed according to the departmental dose-optimization program which includes automated exposure control, adjustment of the mA and/or kV according to patient size and/or use of iterative reconstruction technique. CONTRAST:  171mL OMNIPAQUE IOHEXOL 350 MG/ML SOLN COMPARISON:  Chest CT dated 05/13/2016 and radiograph dated 05/01/2021. FINDINGS: Evaluation of this exam is limited due to respiratory motion artifact. Cardiovascular: There is no cardiomegaly or pericardial effusion. There is coronary vascular calcification. Mild atherosclerotic calcification of the thoracic aorta. No aneurysmal dilatation or dissection. Evaluation of the pulmonary arteries is limited due to respiratory motion artifact. No pulmonary artery embolus identified. Mediastinum/Nodes: No hilar or mediastinal adenopathy. The esophagus and the thyroid gland are grossly unremarkable. No mediastinal fluid collection. Lungs/Pleura: Bibasilar subpleural atelectasis and scarring. There is an 11 x 14 mm nodule with irregular and lobulated margins in the right upper lobe (67/6) concerning for malignancy. Clinical correlation and multidisciplinary consult is advised. There is no pleural effusion pneumothorax. Mucus secretions noted in the right lower lobe bronchus. The central airways remain patent. Upper Abdomen: Gallstones. Musculoskeletal: Degenerative changes of the spine. No acute osseous pathology. Old healed sternal fracture. Review of the MIP images confirms the above findings. IMPRESSION: 1. No CT evidence of pulmonary artery embolus. 2. A 11 x 14 mm nodule with irregular and lobulated margins in the right upper lobe concerning for malignancy. Multidisciplinary consult is advised. 3. Cholelithiasis. 4. Aortic Atherosclerosis (ICD10-I70.0). Electronically Signed   By: Anner Crete M.D.   On: 05/02/2021 19:57   DG Chest Port 1  View  Result Date: 05/01/2021 CLINICAL DATA:  Shortness of breath EXAM: PORTABLE CHEST 1 VIEW COMPARISON:  12/13/2020 FINDINGS: The heart size and mediastinal contours are within normal limits. Aortic atherosclerosis. Both lungs are clear. The visualized skeletal structures are unremarkable. IMPRESSION: No active disease. Electronically Signed   By: Donavan Foil M.D.   On: 05/01/2021 16:24    Microbiology: Recent Results (from the past 240 hour(s))  Resp Panel by RT-PCR (Flu A&B, Covid) Nasopharyngeal Swab     Status: None   Collection Time: 05/01/21  4:31 PM   Specimen: Nasopharyngeal Swab; Nasopharyngeal(NP) swabs  in vial transport medium  Result Value Ref Range Status   SARS Coronavirus 2 by RT PCR NEGATIVE NEGATIVE Final    Comment: (NOTE) SARS-CoV-2 target nucleic acids are NOT DETECTED.  The SARS-CoV-2 RNA is generally detectable in upper respiratory specimens during the acute phase of infection. The lowest concentration of SARS-CoV-2 viral copies this assay can detect is 138 copies/mL. A negative result does not preclude SARS-Cov-2 infection and should not be used as the sole basis for treatment or other patient management decisions. A negative result may occur with  improper specimen collection/handling, submission of specimen other than nasopharyngeal swab, presence of viral mutation(s) within the areas targeted by this assay, and inadequate number of viral copies(<138 copies/mL). A negative result must be combined with clinical observations, patient history, and epidemiological information. The expected result is Negative.  Fact Sheet for Patients:  EntrepreneurPulse.com.au  Fact Sheet for Healthcare Providers:  IncredibleEmployment.be  This test is no t yet approved or cleared by the [place] FDA and  has been authorized for detection and/or diagnosis of SARS-CoV-2 by FDA under an Emergency Use Authorization (EUA). This EUA will  remain  in effect (meaning this test can be used) for the duration of the COVID-19 declaration under Section 564(b)(1) of the Act, 21 U.S.C.section 360bbb-3(b)(1), unless the authorization is terminated  or revoked sooner.       Influenza A by PCR NEGATIVE NEGATIVE Final   Influenza B by PCR NEGATIVE NEGATIVE Final    Comment: (NOTE) The Xpert Xpress SARS-CoV-2/FLU/RSV plus assay is intended as an aid in the diagnosis of influenza from Nasopharyngeal swab specimens and should not be used as a sole basis for treatment. Nasal washings and aspirates are unacceptable for Xpert Xpress SARS-CoV-2/FLU/RSV testing.  Fact Sheet for Patients: EntrepreneurPulse.com.au  Fact Sheet for Healthcare Providers: IncredibleEmployment.be  This test is not yet approved or cleared by the [place] FDA and has been authorized for detection and/or diagnosis of SARS-CoV-2 by FDA under an Emergency Use Authorization (EUA). This EUA will remain in effect (meaning this test can be used) for the duration of the COVID-19 declaration under Section 564(b)(1) of the Act, 21 U.S.C. section 360bbb-3(b)(1), unless the authorization is terminated or revoked.  Performed at [facility], [place] [address] [place], [place] [address]      Labs: Basic Metabolic Panel: Recent Labs  Lab 05/01/21 1513 05/02/21 0416 05/04/21 0406  NA 139 136 137  K 4.0 3.9 4.1  CL 98 99 99  CO2 35* 29 30  GLUCOSE 104* 252* 139*  BUN 21 25* 35*  CREATININE 1.42* 1.26* 1.32*  CALCIUM 8.9 9.1 8.7*   Liver Function Tests: Recent Labs  Lab 05/01/21 1513 05/02/21 0416 05/04/21 0406  AST 17 17 15   ALT 13 13 11   ALKPHOS 72 67 49  BILITOT 0.9 0.6 0.4  PROT 6.3* 5.8* 5.5*  ALBUMIN 3.8 3.4* 3.2*   No results for input(s): LIPASE, AMYLASE in the last 168 hours. No results for input(s): AMMONIA in the last 168 hours. CBC: Recent Labs  Lab 05/01/21 1513 05/02/21 0416  05/04/21 0406  WBC 9.0 7.8 13.8*  NEUTROABS 6.4  --  11.8*  HGB 16.9 16.2 15.8  HCT 52.9* 50.9 48.2  MCV 92.8 92.0 90.3  PLT 214 197 188   Cardiac Enzymes: No results for input(s): CKTOTAL, CKMB, CKMBINDEX, TROPONINI in the last 168 hours. BNP: BNP (last 3 results) Recent Labs    12/13/20 1759 05/01/21 1513  BNP 21.3 27.3  ProBNP (last 3 results) No results for input(s): PROBNP in the last 8760 hours.  CBG: No results for input(s): GLUCAP in the last 168 hours.     Signed:  Nita Sells MD   [facility] 05/05/2021, 11:40 AM

## 2021-05-17 ENCOUNTER — Telehealth: Payer: Self-pay

## 2021-05-17 NOTE — Telephone Encounter (Signed)
**Note De-Identified  Obfuscation** The pt left his completed BMSPAF application for Eliquis at the office with documents . I have completed the providers page of his application and have e-mailed all to Dr Michaelle Copas nurse so she can obtain Dr Randolm Idol (DOD) signature in Dr Lonn Georgia absence, date it, and to fax all to [facility] at the fax number written on the cover letter included or to place in the to be faxed basket in Medical Records to be faxed

## 2021-05-17 NOTE — Telephone Encounter (Signed)
Paperwork completed and faxed.  °

## 2021-05-19 ENCOUNTER — Other Ambulatory Visit: Payer: Self-pay

## 2021-05-19 ENCOUNTER — Other Ambulatory Visit: Payer: Self-pay | Admitting: Primary Care

## 2021-05-19 ENCOUNTER — Encounter: Payer: Self-pay | Admitting: Primary Care

## 2021-05-19 ENCOUNTER — Ambulatory Visit (INDEPENDENT_AMBULATORY_CARE_PROVIDER_SITE_OTHER): Payer: Medicare PPO | Admitting: Primary Care

## 2021-05-19 VITALS — BP 132/68 | HR 47 | Temp 97.9°F | Ht 69.0 in | Wt 240.2 lb

## 2021-05-19 DIAGNOSIS — R911 Solitary pulmonary nodule: Secondary | ICD-10-CM | POA: Diagnosis not present

## 2021-05-19 DIAGNOSIS — J449 Chronic obstructive pulmonary disease, unspecified: Secondary | ICD-10-CM

## 2021-05-19 MED ORDER — TRELEGY ELLIPTA 200-62.5-25 MCG/ACT IN AEPB: 1.0000 | INHALATION_SPRAY | Freq: Every day | RESPIRATORY_TRACT | 0 refills | Status: DC

## 2021-05-19 MED ORDER — FLUTICASONE-UMECLIDIN-VILANT 100-62.5-25 MCG/ACT IN AEPB: 1.0000 | INHALATION_SPRAY | Freq: Every day | RESPIRATORY_TRACT | 0 refills | Status: DC

## 2021-05-19 MED ORDER — GUAIFENESIN ER 600 MG PO TB12: 600.0000 mg | ORAL_TABLET | Freq: Two times a day (BID) | ORAL | 1 refills | Status: DC

## 2021-05-19 MED ORDER — ALBUTEROL SULFATE (2.5 MG/3ML) 0.083% IN NEBU: 2.5000 mg | INHALATION_SOLUTION | Freq: Four times a day (QID) | RESPIRATORY_TRACT | 12 refills | Status: DC | PRN

## 2021-05-19 MED ORDER — TRELEGY ELLIPTA 200-62.5-25 MCG/ACT IN AEPB: 1.0000 | INHALATION_SPRAY | Freq: Every day | RESPIRATORY_TRACT | 1 refills | Status: DC

## 2021-05-19 NOTE — Progress Notes (Signed)
@Patient  ID: Mitchell [name], male    DOB: 1941/04/12, 81 y.o.   MRN: [id number]  Chief Complaint  Patient presents with   Follow-up    SOB at times/ [facility]/ needs refilled     Referring provider: Lawerance Cruel, MD  HPI: 81 year old male, former smoker.  Past medical history significant for COPD, chronic respiratory failure with hypoxia, a flutter, coronary artery disease, chronic congestive heart failure, hypertension.  Patient of Dr. Erin Fulling, last seen on 01/13/21.   05/19/2021 Patient presents today for hospital follow-up. He was switched from [facility] 100 to Trelegy back in September 2022. He was admitted for COPD exacerbation in January 2023, discharged on 60mg  prednisone x 5 days. He is currently using both Dulera and Trelegy along with albuterol every 4 hours. He wears 3L supplemental oxygen throughout the day and at night. He has mold in his bathroom, he spends 30 mins in shower. He has a lot of allergy symptoms. Needs letter to have apartment complex to remove mold, he has lives there for 22 years. He has had a cough x 4 months. Cough is productive with dark colored mucus. CTA on 05/02/21 showed 11-62mm nodule with irregular and lobulated margins in the right upper lobe concern for malignancy.   Allergies  Allergen Reactions   Lipitor [Atorvastatin] Other (See Comments)    "just paralyzed me; I can't take it"   Statins Other (See Comments)    "paralyzed me; I can't take it"   Rosuvastatin     Leg aches    Immunization History  Administered Date(s) Administered   Influenza,inj,Quad PF,6+ Mos 12/31/2016, 01/15/2019   Influenza,inj,quad, With Preservative 01/27/2014, 02/17/2015   PFIZER(Purple Top)SARS-COV-2 Vaccination 05/30/2019, 06/24/2019, 05/09/2020   Pneumococcal Conjugate-13 10/02/2013   Zoster, Live 10/02/2013    Past Medical History:  Diagnosis Date   Anginal pain ([place]) 01/2012   Anxiety    Atherosclerotic heart disease of native coronary artery without  angina pectoris    BPH (benign prostatic hypertrophy)    CAD (coronary artery disease) 2007   with LAD/Diag CBPTCA and kissing    CKD (chronic kidney disease), stage III ([place])    Archie Endo 01/04/2017   COPD (chronic obstructive pulmonary disease) ([place])    "maybe" (02/14/2012)   Coronary artery disease    Depression    GERD (gastroesophageal reflux disease)    Hyperlipidemia    Hypertension    Hypoxemia    Myocardial infarction ([place]) 01/22/2012   NSTEMI with DES to RSA (PDA)   On home oxygen therapy    Shortness of breath    "walking and laying down sometimes" (02/14/2012)    Tobacco History: Social History   Tobacco Use  Smoking Status Former   Types: Pipe   Quit date: 04/28/2011   Years since quitting: 10.0  Smokeless Tobacco Never   Counseling given: Not Answered   Outpatient Medications Prior to Visit  Medication Sig Dispense Refill   acetaminophen (TYLENOL) 500 MG tablet Take 500 mg by mouth every 6 (six) hours as needed.     albuterol (PROVENTIL HFA;VENTOLIN HFA) 108 (90 BASE) MCG/ACT inhaler Inhale 2 puffs into the lungs every 6 (six) hours as needed for wheezing. Reported on 05/04/2015     allopurinol (ZYLOPRIM) 100 MG tablet Take 100 mg by mouth daily.     diclofenac (FLECTOR) 1.3 % PTCH Place 1 patch onto the skin 2 (two) times daily. If not affordable, pls substitute OTC Voltaren Gel or cream--thanks 20 patch 0   ELIQUIS  5 MG TABS tablet TAKE 1 TABLET (5 MG TOTAL) BY MOUTH 2 (TWO) TIMES DAILY. NEED ANNUAL MD APPT FOR REFILLS 180 tablet 1   finasteride (PROSCAR) 5 MG tablet Take 5 mg by mouth daily. Reported on 05/04/2015     furosemide (LASIX) 40 MG tablet Take 40 mg by mouth 2 (two) times daily.     metoprolol tartrate (LOPRESSOR) 25 MG tablet TAKE 1 TABLET TWICE DAILY 180 tablet 1   mupirocin ointment (BACTROBAN) 2 % Apply 1 application topically 2 (two) times daily.     nitroGLYCERIN (NITROSTAT) 0.4 MG SL tablet Place 1 tablet (0.4 mg total) under the tongue every 5  (five) minutes as needed. Reported on 05/04/2015 25 tablet 3   potassium chloride (KLOR-CON 10) 10 MEQ tablet Take 1 tablet (10 mEq total) by mouth daily. 90 tablet 3   pravastatin (PRAVACHOL) 20 MG tablet TAKE 1/2 TABLET EVERY EVENING 45 tablet 3   QUEtiapine (SEROQUEL) 50 MG tablet Take 50 mg by mouth at bedtime.     albuterol (PROVENTIL) (2.5 MG/3ML) 0.083% nebulizer solution Take 3 mLs (2.5 mg total) by nebulization 4 (four) times daily. 75 mL 12   Fluticasone-Umeclidin-Vilant (TRELEGY ELLIPTA) 100-62.5-25 MCG/INH AEPB Inhale 1 puff into the lungs daily. 2 each 0   mometasone-formoterol (DULERA) 100-5 MCG/ACT AERO Inhale 2 puffs into the lungs 2 (two) times daily. 1 Inhaler 6   predniSONE (DELTASONE) 20 MG tablet Take 3 tablets (60 mg total) by mouth daily before breakfast. 20 tablet 0   No facility-administered medications prior to visit.      Review of Systems  Review of Systems  Constitutional: Negative.   HENT: Negative.    Respiratory:  Positive for cough, shortness of breath and wheezing.     Physical Exam  BP 132/68 (BP Location: Left Arm, Patient Position: Sitting, Cuff Size: Normal)    Pulse (!) 47    Temp 97.9 F (36.6 C) (Oral)    Ht 5\' 9"  (1.753 m)    Wt 240 lb 3.2 oz (109 kg)    SpO2 96%    BMI 35.47 kg/m  Physical Exam Constitutional:      Appearance: Normal appearance. He is obese.  Cardiovascular:     Rate and Rhythm: Normal rate and regular rhythm.  Pulmonary:     Effort: Pulmonary effort is normal.     Breath sounds: Wheezing present.  Neurological:     General: No focal deficit present.     Mental Status: He is alert and oriented to person, place, and time. Mental status is at baseline.     Lab Results:  CBC    Component Value Date/Time   WBC 13.8 (H) 05/04/2021 0406   RBC 5.34 05/04/2021 0406   HGB 15.8 05/04/2021 0406   HGB 15.9 01/30/2021 0000   HCT 48.2 05/04/2021 0406   HCT 46.1 01/30/2021 0000   PLT 188 05/04/2021 0406   PLT 176  01/30/2021 0000   MCV 90.3 05/04/2021 0406   MCV 87 01/30/2021 0000   MCH 29.6 05/04/2021 0406   MCHC 32.8 05/04/2021 0406   RDW 14.5 05/04/2021 0406   RDW 12.9 01/30/2021 0000   LYMPHSABS 0.9 05/04/2021 0406   MONOABS 1.0 05/04/2021 0406   EOSABS 0.0 05/04/2021 0406   BASOSABS 0.0 05/04/2021 0406    BMET    Component Value Date/Time   NA 137 05/04/2021 0406   NA 139 01/30/2021 0000   K 4.1 05/04/2021 0406   CL 99 05/04/2021 0406  CO2 30 05/04/2021 0406   GLUCOSE 139 (H) 05/04/2021 0406   BUN 35 (H) 05/04/2021 0406   BUN 20 01/30/2021 0000   CREATININE 1.32 (H) 05/04/2021 0406   CALCIUM 8.7 (L) 05/04/2021 0406   GFRNONAA 54 (L) 05/04/2021 0406   GFRAA >60 06/22/2019 1340    BNP    Component Value Date/Time   BNP 27.3 05/01/2021 1513    ProBNP    Component Value Date/Time   PROBNP 166.6 (H) 07/14/2012 1600    Imaging: CT Angio Chest Pulmonary Embolism (PE) W or WO Contrast  Result Date: 05/02/2021 CLINICAL DATA:  Chest pain and shortness of breath. Concern for pulmonary embolism. EXAM: CT ANGIOGRAPHY CHEST WITH CONTRAST TECHNIQUE: Multidetector CT imaging of the chest was performed using the standard protocol during bolus administration of intravenous contrast. Multiplanar CT image reconstructions and MIPs were obtained to evaluate the vascular anatomy. RADIATION DOSE REDUCTION: This exam was performed according to the departmental dose-optimization program which includes automated exposure control, adjustment of the mA and/or kV according to patient size and/or use of iterative reconstruction technique. CONTRAST:  176mL OMNIPAQUE IOHEXOL 350 MG/ML SOLN COMPARISON:  Chest CT dated 05/13/2016 and radiograph dated 05/01/2021. FINDINGS: Evaluation of this exam is limited due to respiratory motion artifact. Cardiovascular: There is no cardiomegaly or pericardial effusion. There is coronary vascular calcification. Mild atherosclerotic calcification of the thoracic aorta. No  aneurysmal dilatation or dissection. Evaluation of the pulmonary arteries is limited due to respiratory motion artifact. No pulmonary artery embolus identified. Mediastinum/Nodes: No hilar or mediastinal adenopathy. The esophagus and the thyroid gland are grossly unremarkable. No mediastinal fluid collection. Lungs/Pleura: Bibasilar subpleural atelectasis and scarring. There is an 11 x 14 mm nodule with irregular and lobulated margins in the right upper lobe (67/6) concerning for malignancy. Clinical correlation and multidisciplinary consult is advised. There is no pleural effusion pneumothorax. Mucus secretions noted in the right lower lobe bronchus. The central airways remain patent. Upper Abdomen: Gallstones. Musculoskeletal: Degenerative changes of the spine. No acute osseous pathology. Old healed sternal fracture. Review of the MIP images confirms the above findings. IMPRESSION: 1. No CT evidence of pulmonary artery embolus. 2. A 11 x 14 mm nodule with irregular and lobulated margins in the right upper lobe concerning for malignancy. Multidisciplinary consult is advised. 3. Cholelithiasis. 4. Aortic Atherosclerosis (ICD10-I70.0). Electronically Signed   By: Anner Crete M.D.   On: 05/02/2021 19:57   DG Chest Port 1 View  Result Date: 05/01/2021 CLINICAL DATA:  Shortness of breath EXAM: PORTABLE CHEST 1 VIEW COMPARISON:  12/13/2020 FINDINGS: The heart size and mediastinal contours are within normal limits. Aortic atherosclerosis. Both lungs are clear. The visualized skeletal structures are unremarkable. IMPRESSION: No active disease. Electronically Signed   By: Donavan Foil M.D.   On: 05/01/2021 16:24     Assessment & Plan:   COPD (chronic obstructive pulmonary disease) ([place]) - Hospitalized in January 2023 for COPD exacerbation, discharged on prednisone 60mg  x 5 days. He has been using both Trelegy 100 and Dulera 100 daily. Reinforced proper use, we will increased Trelegy to 246mcg d.t wheezing  heard on exam and discontinue Dulera.   Pulmonary nodule - CTA on 05/02/21 showed new 11-14mm RUL pulmonary nodule, this was not present back on imaging in 2013. Discussed with Dr. Erin Fulling, recommending short term follow-up with repeat CT chest in 3 months, he felt this could be mucus plugging. Advised patient on airway clearance techniques. We briefly discussed potential need for tissue sampling if nodule changes  in size, he would likely be high risk d.t severe COPD/ FEV1 38%.   FU in 3 months after CT chest with Dr. Romero Liner, NP 05/22/2021

## 2021-05-19 NOTE — Patient Instructions (Addendum)
You should only be using Trelegy and albuterol as needed CT chest showed lung nodule, we will repeat scan in 3 months to ensure it is not getting large. It could be mucus plugging If increasing in size we will need to discuss bx  Recommendations: - STOP Dulera  - Start Trelegy 200 - take one puff daily in the morning (sample given and prescription) - Only use albuterol nebulizer if you need, you can take every 4-6 hours for shortness of breath  - Start mucinex twice daily   Orders: CT chest wo contrast in 3 months re: solitary lung nodule > 1 cm  Rx: Trelegy  Albuterol neb  Mucinex   Follow-up: 3 months with Dr. Francine Graven after CT

## 2021-05-22 NOTE — Telephone Encounter (Signed)
Changed from Trelegy 100 to . And discontinued Dulera 100.

## 2021-05-22 NOTE — Telephone Encounter (Signed)
Beth, please advise on this request.

## 2021-05-22 NOTE — Assessment & Plan Note (Addendum)
-   Hospitalized in January 2023 for COPD exacerbation, discharged on prednisone 60mg  x 5 days. He has been using both Trelegy 100 and Dulera 100 daily. Reinforced proper use, we will increased Trelegy to 275mcg d.t wheezing heard on exam and discontinue Dulera.

## 2021-05-22 NOTE — Assessment & Plan Note (Addendum)
-   CTA on 05/02/21 showed new 11-38mm RUL pulmonary nodule, this was not present back on imaging in 2013. Discussed with Dr. Erin Fulling, recommending short term follow-up with repeat CT chest in 3 months, he felt this could be mucus plugging. Advised patient on airway clearance techniques. We briefly discussed potential need for tissue sampling if nodule changes in size, he would likely be high risk d.t severe COPD/ FEV1 38%.

## 2021-05-23 DIAGNOSIS — J441 Chronic obstructive pulmonary disease with (acute) exacerbation: Secondary | ICD-10-CM | POA: Diagnosis not present

## 2021-05-23 DIAGNOSIS — I10 Essential (primary) hypertension: Secondary | ICD-10-CM | POA: Diagnosis not present

## 2021-05-23 DIAGNOSIS — M25569 Pain in unspecified knee: Secondary | ICD-10-CM | POA: Diagnosis not present

## 2021-05-23 DIAGNOSIS — R911 Solitary pulmonary nodule: Secondary | ICD-10-CM | POA: Diagnosis not present

## 2021-05-23 DIAGNOSIS — R29898 Other symptoms and signs involving the musculoskeletal system: Secondary | ICD-10-CM | POA: Diagnosis not present

## 2021-05-25 DIAGNOSIS — I10 Essential (primary) hypertension: Secondary | ICD-10-CM | POA: Diagnosis not present

## 2021-05-25 DIAGNOSIS — I509 Heart failure, unspecified: Secondary | ICD-10-CM | POA: Diagnosis not present

## 2021-05-25 DIAGNOSIS — J449 Chronic obstructive pulmonary disease, unspecified: Secondary | ICD-10-CM | POA: Diagnosis not present

## 2021-06-05 DIAGNOSIS — R059 Cough, unspecified: Secondary | ICD-10-CM | POA: Diagnosis not present

## 2021-06-06 DIAGNOSIS — R051 Acute cough: Secondary | ICD-10-CM | POA: Diagnosis not present

## 2021-06-06 DIAGNOSIS — Z03818 Encounter for observation for suspected exposure to other biological agents ruled out: Secondary | ICD-10-CM | POA: Diagnosis not present

## 2021-06-22 DIAGNOSIS — I10 Essential (primary) hypertension: Secondary | ICD-10-CM | POA: Diagnosis not present

## 2021-06-22 DIAGNOSIS — J449 Chronic obstructive pulmonary disease, unspecified: Secondary | ICD-10-CM | POA: Diagnosis not present

## 2021-06-22 DIAGNOSIS — I509 Heart failure, unspecified: Secondary | ICD-10-CM | POA: Diagnosis not present

## 2021-06-27 ENCOUNTER — Telehealth: Payer: Self-pay | Admitting: Interventional Cardiology

## 2021-06-27 NOTE — Telephone Encounter (Signed)
Spoke with [name], DPR on file.  She was wondering if it was ok for pt to be taking the Pravastatin because he has taken statin medications in the past and they "locked him up", meaning he didn't have good use of his legs.  Explained that pt has been on this dose of Pravastatin since 2020 and has not verbalized any issues.  Advised that as long as he's doing fine, we need to continue that medication.  [name] verbalized understanding and was appreciative for call.  ?

## 2021-06-27 NOTE — Telephone Encounter (Signed)
Pt c/o medication issue: ? ?1. Name of Medication: pravastatin (PRAVACHOL) 20 MG tablet ? ?2. How are you currently taking this medication (dosage and times per day)? Half tablet daily ? ?3. Are you having a reaction (difficulty breathing--STAT)? no ? ?4. What is your medication issue? Patient's care taker states she thinks the patient was allergic to statins a long time ago, but is taking pravastatin. She says at one point the ambulance took him off a medication, but she is not sure what. She also says Dr. Katrinka Blazing told him to take whatever it was for 2 more weeks. ?

## 2021-07-17 DIAGNOSIS — J449 Chronic obstructive pulmonary disease, unspecified: Secondary | ICD-10-CM | POA: Diagnosis not present

## 2021-07-17 DIAGNOSIS — E7800 Pure hypercholesterolemia, unspecified: Secondary | ICD-10-CM | POA: Diagnosis not present

## 2021-07-17 DIAGNOSIS — I10 Essential (primary) hypertension: Secondary | ICD-10-CM | POA: Diagnosis not present

## 2021-07-17 DIAGNOSIS — M1711 Unilateral primary osteoarthritis, right knee: Secondary | ICD-10-CM | POA: Diagnosis not present

## 2021-07-19 DIAGNOSIS — M1711 Unilateral primary osteoarthritis, right knee: Secondary | ICD-10-CM | POA: Diagnosis not present

## 2021-07-23 DIAGNOSIS — I10 Essential (primary) hypertension: Secondary | ICD-10-CM | POA: Diagnosis not present

## 2021-07-23 DIAGNOSIS — J449 Chronic obstructive pulmonary disease, unspecified: Secondary | ICD-10-CM | POA: Diagnosis not present

## 2021-07-23 DIAGNOSIS — I509 Heart failure, unspecified: Secondary | ICD-10-CM | POA: Diagnosis not present

## 2021-08-01 ENCOUNTER — Ambulatory Visit (HOSPITAL_COMMUNITY)
Admission: RE | Admit: 2021-08-01 | Discharge: 2021-08-01 | Disposition: A | Discharge status: Home / Self Care | Payer: Medicare PPO | Source: Ambulatory Visit | Attending: Primary Care | Admitting: Primary Care

## 2021-08-01 DIAGNOSIS — R911 Solitary pulmonary nodule: Secondary | ICD-10-CM | POA: Insufficient documentation

## 2021-08-01 DIAGNOSIS — R918 Other nonspecific abnormal finding of lung field: Secondary | ICD-10-CM | POA: Insufficient documentation

## 2021-08-01 DIAGNOSIS — I2510 Atherosclerotic heart disease of native coronary artery without angina pectoris: Secondary | ICD-10-CM | POA: Diagnosis not present

## 2021-08-01 DIAGNOSIS — I700 Atherosclerosis of aorta: Secondary | ICD-10-CM | POA: Insufficient documentation

## 2021-08-04 ENCOUNTER — Encounter: Payer: Self-pay | Admitting: Pulmonary Disease

## 2021-08-04 ENCOUNTER — Ambulatory Visit (INDEPENDENT_AMBULATORY_CARE_PROVIDER_SITE_OTHER): Payer: Medicare PPO | Admitting: Pulmonary Disease

## 2021-08-04 VITALS — BP 126/72 | HR 55 | Ht 69.0 in

## 2021-08-04 DIAGNOSIS — J441 Chronic obstructive pulmonary disease with (acute) exacerbation: Secondary | ICD-10-CM

## 2021-08-04 DIAGNOSIS — J9611 Chronic respiratory failure with hypoxia: Secondary | ICD-10-CM

## 2021-08-04 MED ORDER — METHYLPREDNISOLONE ACETATE 80 MG/ML IJ SUSP
80.0000 mg | Freq: Once | INTRAMUSCULAR | Status: AC
  Administered 2021-08-04: 80 mg via INTRAMUSCULAR

## 2021-08-04 MED ORDER — AZITHROMYCIN 250 MG PO TABS: ORAL_TABLET | ORAL | 0 refills | Status: DC

## 2021-08-04 MED ORDER — BREZTRI AEROSPHERE 160-9-4.8 MCG/ACT IN AERO: 2.0000 | INHALATION_SPRAY | Freq: Two times a day (BID) | RESPIRATORY_TRACT | 0 refills | Status: DC

## 2021-08-04 MED ORDER — IPRATROPIUM-ALBUTEROL 0.5-2.5 (3) MG/3ML IN SOLN
3.0000 mL | Freq: Once | RESPIRATORY_TRACT | Status: AC
  Administered 2021-08-04: 3 mL via RESPIRATORY_TRACT

## 2021-08-04 MED ORDER — PREDNISONE 10 MG PO TABS: ORAL_TABLET | ORAL | 0 refills | Status: AC

## 2021-08-04 MED ORDER — BREZTRI AEROSPHERE 160-9-4.8 MCG/ACT IN AERO: 2.0000 | INHALATION_SPRAY | Freq: Two times a day (BID) | RESPIRATORY_TRACT | 6 refills | Status: DC

## 2021-08-04 MED ORDER — MONTELUKAST SODIUM 10 MG PO TABS: 10.0000 mg | ORAL_TABLET | Freq: Every day | ORAL | 11 refills | Status: DC

## 2021-08-04 NOTE — Progress Notes (Signed)
? ?Synopsis: Referred in September 2022 for COPD by Otelia Santee, PA ? ?Subjective:  ? ?PATIENT ID: Mitchell [name] GENDER: male DOB: [birth year], MRN: [id number] ? ?HPI ? ?Chief Complaint  ?Patient presents with  ? Follow-up  ?  3 mo f/u after CT. States he has been more SOB over the past 2 weeks.   ? ?Mitchell [name] is an 81 year old male, former smoker with CAD, CKD stage III, GERD, hypertension, chronic hypoxemic respiratory failure due to emphysema who returns to pulmonary clinic for COPD. ? ?He was seen by Derl Barrow, NP on 05/19/21 for hospital follow up after being admitted for COPD exacerbation. His trelegy was increased to 200 at last visit. A new 11-1mm RUL nodule was noted on CTA on 05/02/21. Repeat CT Chest 08/01/21 showed mostly resolved RUL nodule. New irregular nodule of the left lower lobe abutting the posterior pleural margin. ? ?He reports he feels he is getting worse. He is having a lot of trouble breathing. He had covid 2 months ago.  ? ?Using nebulizer machine 2-3 times per day. Using 2-3L of oxygen day/night. He has been out of trelegy ellipta 200 over the past 2 weeks.  ? ?OV 01/13/2021 ?He had an ER visit 12/13/20 for dyspnea and was treated for COPD exacerbation. He has been doing ok since last month but continues to have bad days with his breathing throughout the week with increased cough, wheezing and dyspnea.  ? ?He is using dulera 100-10mcg 2 puffs twice daily and as needed albuterol throughout the day. He has limited eyesight and reports some difficulty using his inhalers.  ? ?He is a former smoker, smoked a pipe from [place] to 2013. He also reports a significant amount of second hand smoke throughout his life. He worked as a Administrator.  ? ?He is on 2.5L of supplemental oxygen at home which he uses throughout the day and night.  ? ? ?Past Medical History:  ?Diagnosis Date  ? Anginal pain ([place]) 01/2012  ? Anxiety   ? Atherosclerotic heart disease of native coronary artery without angina  pectoris   ? BPH (benign prostatic hypertrophy)   ? CAD (coronary artery disease) 2007  ? with LAD/Diag CBPTCA and kissing   ? CKD (chronic kidney disease), stage III ([place])   ? Archie Endo 01/04/2017  ? COPD (chronic obstructive pulmonary disease) ([place])   ? "maybe" (02/14/2012)  ? Coronary artery disease   ? Depression   ? GERD (gastroesophageal reflux disease)   ? Hyperlipidemia   ? Hypertension   ? Hypoxemia   ? Myocardial infarction ([place]) 01/22/2012  ? NSTEMI with DES to RSA (PDA)  ? On home oxygen therapy   ? Shortness of breath   ? "walking and laying down sometimes" (02/14/2012)  ?  ? ?Family History  ?Problem Relation Age of Onset  ? Heart failure Mother   ? Heart failure Brother   ?  ? ?Social History  ? ?Socioeconomic History  ? Marital status: Single  ?  Spouse name: Not on file  ? Number of children: 3  ? Years of education: Not on file  ? Highest education level: Not on file  ?Occupational History  ? Not on file  ?Tobacco Use  ? Smoking status: Former  ?  Types: Pipe  ?  Quit date: 04/28/2011  ?  Years since quitting: 10.2  ? Smokeless tobacco: Never  ?Vaping Use  ? Vaping Use: Never used  ?Substance and Sexual Activity  ? Alcohol  use: Not Currently  ?  Alcohol/week: 7.0 standard drinks  ?  Types: 7 Standard drinks or equivalent per week  ?  Comment: 02/14/2012 "weekends I have 6-7 mixed drinks"     occasional  ? Drug use: No  ? Sexual activity: Yes  ?Other Topics Concern  ? 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   ? ?Social Determinants of Health  ? ?Financial Resource Strain: Not on file  ?Food Insecurity: Not on file  ?Transportation Needs: Not on file  ?Physical Activity: Not on file  ?Stress: Not on file  ?Social Connections: Not on file  ?Intimate Partner Violence: Not on file  ?  ? ?Allergies  ?Allergen Reactions  ? Lipitor [Atorvastatin] Other (See Comments)  ?  "just paralyzed me; I can't take it"  ? Statins Other (See Comments)  ?  "paralyzed me; I can't take it"  ? Rosuvastatin   ?   Leg aches  ?  ? ?Outpatient Medications Prior to Visit  ?Medication Sig Dispense Refill  ? acetaminophen (TYLENOL) 500 MG tablet Take 500 mg by mouth every 6 (six) hours as needed.    ? albuterol (PROVENTIL HFA;VENTOLIN HFA) 108 (90 BASE) MCG/ACT inhaler Inhale 2 puffs into the lungs every 6 (six) hours as needed for wheezing. Reported on 05/04/2015    ? albuterol (PROVENTIL) (2.5 MG/3ML) 0.083% nebulizer solution Take 3 mLs (2.5 mg total) by nebulization every 6 (six) hours as needed for wheezing or shortness of breath. 75 mL 12  ? allopurinol (ZYLOPRIM) 100 MG tablet Take 100 mg by mouth daily.    ? diclofenac (FLECTOR) 1.3 % PTCH Place 1 patch onto the skin 2 (two) times daily. If not affordable, pls substitute OTC Voltaren Gel or cream--thanks 20 patch 0  ? ELIQUIS 5 MG TABS tablet TAKE 1 TABLET (5 MG TOTAL) BY MOUTH 2 (TWO) TIMES DAILY. NEED ANNUAL MD APPT FOR REFILLS 180 tablet 1  ? finasteride (PROSCAR) 5 MG tablet Take 5 mg by mouth daily. Reported on 05/04/2015    ? furosemide (LASIX) 40 MG tablet Take 40 mg by mouth 2 (two) times daily.    ? guaiFENesin (MUCINEX) 600 MG 12 hr tablet Take 1 tablet (600 mg total) by mouth 2 (two) times daily. 60 tablet 1  ? metoprolol tartrate (LOPRESSOR) 25 MG tablet TAKE 1 TABLET TWICE DAILY 180 tablet 1  ? mupirocin ointment (BACTROBAN) 2 % Apply 1 application topically 2 (two) times daily.    ? nitroGLYCERIN (NITROSTAT) 0.4 MG SL tablet Place 1 tablet (0.4 mg total) under the tongue every 5 (five) minutes as needed. Reported on 05/04/2015 25 tablet 3  ? potassium chloride (KLOR-CON 10) 10 MEQ tablet Take 1 tablet (10 mEq total) by mouth daily. 90 tablet 3  ? pravastatin (PRAVACHOL) 20 MG tablet TAKE 1/2 TABLET EVERY EVENING 45 tablet 3  ? QUEtiapine (SEROQUEL) 50 MG tablet Take 50 mg by mouth at bedtime.    ? TRELEGY ELLIPTA 200-62.5-25 MCG/ACT AEPB TAKE 1 PUFF BY MOUTH EVERY DAY 60 each 0  ? ?No facility-administered medications prior to visit.  ? ?Review of Systems   ?Constitutional:  Negative for chills, fever, malaise/fatigue and weight loss.  ?HENT:  Negative for congestion, sinus pain and sore throat.   ?Eyes: Negative.   ?Respiratory:  Positive for cough, sputum production, shortness of breath and wheezing. Negative for hemoptysis.   ?Cardiovascular:  Negative for chest pain, palpitations, orthopnea, claudication and leg swelling.  ?Gastrointestinal:  Negative for abdominal pain, heartburn, nausea and vomiting.  ?Genitourinary: Negative.   ?Musculoskeletal:  Negative for joint pain and myalgias.  ?Skin:  Negative for rash.  ?Neurological:  Negative for weakness.  ?Endo/Heme/Allergies: Negative.   ?Psychiatric/Behavioral: Negative.    ? ?Objective:  ? ?Vitals:  ? 08/04/21 1037  ?BP: 126/72  ?Pulse: (!) 55  ?SpO2: 95%  ?Height: 5\' 9"  (1.753 m)  ? ?Physical Exam ?Constitutional:   ?   General: He is not in acute distress. ?HENT:  ?   Head: Normocephalic and atraumatic.  ?Eyes:  ?   Extraocular Movements: Extraocular movements intact.  ?   Conjunctiva/sclera: Conjunctivae normal.  ?   Pupils: Pupils are equal, round, and reactive to light.  ?Cardiovascular:  ?   Rate and Rhythm: Normal rate and regular rhythm.  ?   Pulses: Normal pulses.  ?   Heart sounds: Normal heart sounds. No murmur heard. ?Pulmonary:  ?   Effort: Respiratory distress (mild) present.  ?   Breath sounds: Decreased air movement present. Wheezing (scattered) present.  ?Abdominal:  ?   General: Bowel sounds are normal.  ?   Palpations: Abdomen is soft.  ?Musculoskeletal:  ?   Right lower leg: No edema.  ?   Left lower leg: No edema.  ?Lymphadenopathy:  ?   Cervical: No cervical adenopathy.  ?Skin: ?   General: Skin is warm and dry.  ?Neurological:  ?   General: No focal deficit present.  ?   Mental Status: He is alert.  ?Psychiatric:     ?   Mood and Affect: Mood normal.     ?   Behavior: Behavior normal.     ?   Thought Content: Thought content normal.     ?   Judgment: Judgment normal.  ? ? ?CBC ?    ?Component Value Date/Time  ? WBC 13.8 (H) 05/04/2021 0406  ? RBC 5.34 05/04/2021 0406  ? HGB 15.8 05/04/2021 0406  ? HGB 15.9 01/30/2021 0000  ? HCT 48.2 05/04/2021 0406  ? HCT 46.1 01/30/2021 0000  ? PLT 188 01/19/202

## 2021-08-04 NOTE — Patient Instructions (Addendum)
We will treat you for COPD exacerbation ? ?Prednisone taper: ?40mg  daily x 3 days ?30mg  daily x 3 days ?20mg  daily x 3 days ?10mg  daily x 3 days ? ?Take Zapk antibiotic as prescribed ? ?Continue supplemental oxygen therapy ? ?Start breztri inhaler 2 puffs twice daily ?- rinse mouth out after each use ? ?Stop dulera inhaler while using breztri ? ?Use albuterol nebulizer treatment as needed ?- recommend using it prior to taking the breztri inhaler ? ?Follow up in 4 weeks with an NP or myself ?

## 2021-08-07 NOTE — Progress Notes (Signed)
You saw him on the 21st of April, did you happen to go over these CT results or do I need to call him?

## 2021-08-08 ENCOUNTER — Encounter: Payer: Self-pay | Admitting: Pulmonary Disease

## 2021-08-17 DIAGNOSIS — M1711 Unilateral primary osteoarthritis, right knee: Secondary | ICD-10-CM | POA: Diagnosis not present

## 2021-08-22 DIAGNOSIS — J449 Chronic obstructive pulmonary disease, unspecified: Secondary | ICD-10-CM | POA: Diagnosis not present

## 2021-08-22 DIAGNOSIS — I10 Essential (primary) hypertension: Secondary | ICD-10-CM | POA: Diagnosis not present

## 2021-08-22 DIAGNOSIS — I509 Heart failure, unspecified: Secondary | ICD-10-CM | POA: Diagnosis not present

## 2021-08-23 ENCOUNTER — Other Ambulatory Visit: Payer: Self-pay | Admitting: Pulmonary Disease

## 2021-08-23 ENCOUNTER — Ambulatory Visit: Payer: Medicare PPO | Admitting: Student

## 2021-08-23 ENCOUNTER — Emergency Department (HOSPITAL_COMMUNITY): Payer: Medicare PPO

## 2021-08-23 ENCOUNTER — Inpatient Hospital Stay (HOSPITAL_COMMUNITY)
Admission: EM | Admit: 2021-08-23 | Discharge: 2021-08-26 | DRG: 291 | Disposition: A | Discharge status: Home Health | Payer: Medicare PPO | Attending: Internal Medicine | Admitting: Internal Medicine

## 2021-08-23 ENCOUNTER — Other Ambulatory Visit: Payer: Self-pay

## 2021-08-23 ENCOUNTER — Encounter (HOSPITAL_COMMUNITY): Payer: Self-pay

## 2021-08-23 DIAGNOSIS — Z9981 Dependence on supplemental oxygen: Secondary | ICD-10-CM | POA: Diagnosis not present

## 2021-08-23 DIAGNOSIS — Z6832 Body mass index (BMI) 32.0-32.9, adult: Secondary | ICD-10-CM | POA: Diagnosis not present

## 2021-08-23 DIAGNOSIS — E785 Hyperlipidemia, unspecified: Secondary | ICD-10-CM | POA: Diagnosis present

## 2021-08-23 DIAGNOSIS — F419 Anxiety disorder, unspecified: Secondary | ICD-10-CM | POA: Diagnosis present

## 2021-08-23 DIAGNOSIS — M62838 Other muscle spasm: Secondary | ICD-10-CM | POA: Diagnosis not present

## 2021-08-23 DIAGNOSIS — I130 Hypertensive heart and chronic kidney disease with heart failure and stage 1 through stage 4 chronic kidney disease, or unspecified chronic kidney disease: Principal | ICD-10-CM | POA: Diagnosis present

## 2021-08-23 DIAGNOSIS — G4700 Insomnia, unspecified: Secondary | ICD-10-CM | POA: Diagnosis present

## 2021-08-23 DIAGNOSIS — I252 Old myocardial infarction: Secondary | ICD-10-CM

## 2021-08-23 DIAGNOSIS — Z7901 Long term (current) use of anticoagulants: Secondary | ICD-10-CM

## 2021-08-23 DIAGNOSIS — I5033 Acute on chronic diastolic (congestive) heart failure: Secondary | ICD-10-CM | POA: Diagnosis not present

## 2021-08-23 DIAGNOSIS — I2510 Atherosclerotic heart disease of native coronary artery without angina pectoris: Secondary | ICD-10-CM | POA: Diagnosis present

## 2021-08-23 DIAGNOSIS — J9611 Chronic respiratory failure with hypoxia: Secondary | ICD-10-CM

## 2021-08-23 DIAGNOSIS — J9601 Acute respiratory failure with hypoxia: Secondary | ICD-10-CM | POA: Diagnosis not present

## 2021-08-23 DIAGNOSIS — I509 Heart failure, unspecified: Secondary | ICD-10-CM

## 2021-08-23 DIAGNOSIS — R0602 Shortness of breath: Secondary | ICD-10-CM | POA: Diagnosis present

## 2021-08-23 DIAGNOSIS — E6609 Other obesity due to excess calories: Secondary | ICD-10-CM | POA: Diagnosis present

## 2021-08-23 DIAGNOSIS — J431 Panlobular emphysema: Secondary | ICD-10-CM | POA: Diagnosis not present

## 2021-08-23 DIAGNOSIS — Z20822 Contact with and (suspected) exposure to covid-19: Secondary | ICD-10-CM | POA: Diagnosis present

## 2021-08-23 DIAGNOSIS — E7800 Pure hypercholesterolemia, unspecified: Secondary | ICD-10-CM | POA: Diagnosis not present

## 2021-08-23 DIAGNOSIS — M109 Gout, unspecified: Secondary | ICD-10-CM | POA: Diagnosis present

## 2021-08-23 DIAGNOSIS — Z961 Presence of intraocular lens: Secondary | ICD-10-CM | POA: Diagnosis present

## 2021-08-23 DIAGNOSIS — I25119 Atherosclerotic heart disease of native coronary artery with unspecified angina pectoris: Secondary | ICD-10-CM | POA: Diagnosis not present

## 2021-08-23 DIAGNOSIS — Z9841 Cataract extraction status, right eye: Secondary | ICD-10-CM | POA: Diagnosis not present

## 2021-08-23 DIAGNOSIS — J441 Chronic obstructive pulmonary disease with (acute) exacerbation: Secondary | ICD-10-CM

## 2021-08-23 DIAGNOSIS — Z8249 Family history of ischemic heart disease and other diseases of the circulatory system: Secondary | ICD-10-CM

## 2021-08-23 DIAGNOSIS — Z8659 Personal history of other mental and behavioral disorders: Secondary | ICD-10-CM | POA: Diagnosis not present

## 2021-08-23 DIAGNOSIS — Z87891 Personal history of nicotine dependence: Secondary | ICD-10-CM

## 2021-08-23 DIAGNOSIS — Z79899 Other long term (current) drug therapy: Secondary | ICD-10-CM | POA: Diagnosis not present

## 2021-08-23 DIAGNOSIS — N400 Enlarged prostate without lower urinary tract symptoms: Secondary | ICD-10-CM | POA: Diagnosis not present

## 2021-08-23 DIAGNOSIS — R062 Wheezing: Secondary | ICD-10-CM | POA: Diagnosis not present

## 2021-08-23 DIAGNOSIS — N1831 Chronic kidney disease, stage 3a: Secondary | ICD-10-CM | POA: Diagnosis present

## 2021-08-23 DIAGNOSIS — R069 Unspecified abnormalities of breathing: Secondary | ICD-10-CM | POA: Diagnosis not present

## 2021-08-23 DIAGNOSIS — F32A Depression, unspecified: Secondary | ICD-10-CM | POA: Diagnosis present

## 2021-08-23 DIAGNOSIS — Z955 Presence of coronary angioplasty implant and graft: Secondary | ICD-10-CM

## 2021-08-23 DIAGNOSIS — E669 Obesity, unspecified: Secondary | ICD-10-CM | POA: Diagnosis present

## 2021-08-23 DIAGNOSIS — K219 Gastro-esophageal reflux disease without esophagitis: Secondary | ICD-10-CM | POA: Diagnosis present

## 2021-08-23 DIAGNOSIS — M79605 Pain in left leg: Secondary | ICD-10-CM | POA: Diagnosis not present

## 2021-08-23 DIAGNOSIS — M79604 Pain in right leg: Secondary | ICD-10-CM | POA: Diagnosis not present

## 2021-08-23 DIAGNOSIS — J9621 Acute and chronic respiratory failure with hypoxia: Secondary | ICD-10-CM | POA: Diagnosis present

## 2021-08-23 DIAGNOSIS — E782 Mixed hyperlipidemia: Secondary | ICD-10-CM | POA: Diagnosis present

## 2021-08-23 DIAGNOSIS — N183 Chronic kidney disease, stage 3 unspecified: Secondary | ICD-10-CM | POA: Diagnosis present

## 2021-08-23 DIAGNOSIS — Z9842 Cataract extraction status, left eye: Secondary | ICD-10-CM | POA: Diagnosis not present

## 2021-08-23 DIAGNOSIS — N1832 Chronic kidney disease, stage 3b: Secondary | ICD-10-CM | POA: Diagnosis not present

## 2021-08-23 DIAGNOSIS — I10 Essential (primary) hypertension: Secondary | ICD-10-CM | POA: Diagnosis not present

## 2021-08-23 LAB — BASIC METABOLIC PANEL
Anion gap: 3 — ABNORMAL LOW (ref 5–15)
BUN: 18 mg/dL (ref 8–23)
CO2: 35 mmol/L — ABNORMAL HIGH (ref 22–32)
Calcium: 9 mg/dL (ref 8.9–10.3)
Chloride: 103 mmol/L (ref 98–111)
Creatinine, Ser: 1.18 mg/dL (ref 0.61–1.24)
GFR, Estimated: >60 mL/min (ref >60)
Glucose, Bld: 98 mg/dL (ref 70–99)
Potassium: 4.4 mmol/L (ref 3.5–5.1)
Sodium: 141 mmol/L (ref 135–145)

## 2021-08-23 LAB — TROPONIN I (HIGH SENSITIVITY)
Troponin I (High Sensitivity): 3 ng/L (ref <18)
Troponin I (High Sensitivity): 4 ng/L (ref <18)
Troponin I (High Sensitivity): 4 ng/L (ref <18)

## 2021-08-23 LAB — RESP PANEL BY RT-PCR (FLU A&B, COVID) ARPGX2
Influenza A by PCR: NEGATIVE
Influenza B by PCR: NEGATIVE
SARS Coronavirus 2 by RT PCR: NEGATIVE

## 2021-08-23 LAB — BRAIN NATRIURETIC PEPTIDE: B Natriuretic Peptide: 40 pg/mL (ref 0.0–100.0)

## 2021-08-23 LAB — BLOOD GAS, VENOUS
Acid-Base Excess: 7.6 mmol/L — ABNORMAL HIGH (ref 0.0–2.0)
Bicarbonate: 36.9 mmol/L — ABNORMAL HIGH (ref 20.0–28.0)
O2 Saturation: 38 %
Patient temperature: 37
pCO2, Ven: 75 mmHg (ref 44–60)
pH, Ven: 7.3 (ref 7.25–7.43)
pO2, Ven: <31 mmHg — CL (ref 32–45)

## 2021-08-23 MED ORDER — TIZANIDINE HCL 4 MG PO TABS
2.0000 mg | ORAL_TABLET | Freq: Once | ORAL | Status: AC
  Administered 2021-08-24: 2 mg via ORAL
  Filled 2021-08-23: qty 1

## 2021-08-23 MED ORDER — SODIUM CHLORIDE 0.9 % IV SOLN: 250.0000 mL | INTRAVENOUS | Status: DC | PRN

## 2021-08-23 MED ORDER — BUDESON-GLYCOPYRROL-FORMOTEROL 160-9-4.8 MCG/ACT IN AERO: 2.0000 | INHALATION_SPRAY | Freq: Two times a day (BID) | RESPIRATORY_TRACT | Status: DC

## 2021-08-23 MED ORDER — DICLOFENAC EPOLAMINE 1.3 % EX PTCH: 1.0000 | MEDICATED_PATCH | Freq: Two times a day (BID) | CUTANEOUS | Status: DC

## 2021-08-23 MED ORDER — MONTELUKAST SODIUM 10 MG PO TABS
10.0000 mg | ORAL_TABLET | Freq: Every day | ORAL | Status: DC
Start: 2021-08-23 — End: 2021-08-26
  Administered 2021-08-24 – 2021-08-25 (×3): 10 mg via ORAL
  Filled 2021-08-23 (×3): qty 1

## 2021-08-23 MED ORDER — SODIUM CHLORIDE 0.9% FLUSH: 3.0000 mL | INTRAVENOUS | Status: DC | PRN

## 2021-08-23 MED ORDER — ASPIRIN EC 81 MG PO TBEC
81.0000 mg | DELAYED_RELEASE_TABLET | Freq: Every day | ORAL | Status: DC
  Administered 2021-08-24 – 2021-08-26 (×3): 81 mg via ORAL
  Filled 2021-08-23 (×3): qty 1

## 2021-08-23 MED ORDER — ACETAMINOPHEN 325 MG PO TABS
650.0000 mg | ORAL_TABLET | ORAL | Status: DC | PRN
  Administered 2021-08-26: 650 mg via ORAL
  Filled 2021-08-23 (×2): qty 2

## 2021-08-23 MED ORDER — QUETIAPINE FUMARATE 50 MG PO TABS: 50.0000 mg | ORAL_TABLET | Freq: Every day | ORAL | Status: DC

## 2021-08-23 MED ORDER — FUROSEMIDE 10 MG/ML IJ SOLN
60.0000 mg | Freq: Two times a day (BID) | INTRAMUSCULAR | Status: DC
  Administered 2021-08-23 – 2021-08-24 (×2): 60 mg via INTRAVENOUS
  Filled 2021-08-23: qty 6
  Filled 2021-08-23: qty 8

## 2021-08-23 MED ORDER — FUROSEMIDE 10 MG/ML IJ SOLN
40.0000 mg | Freq: Once | INTRAMUSCULAR | Status: AC
  Administered 2021-08-23: 40 mg via INTRAVENOUS

## 2021-08-23 MED ORDER — APIXABAN 5 MG PO TABS
5.0000 mg | ORAL_TABLET | Freq: Two times a day (BID) | ORAL | Status: DC
Start: 2021-08-23 — End: 2021-08-26
  Administered 2021-08-24 – 2021-08-26 (×5): 5 mg via ORAL
  Filled 2021-08-23 (×5): qty 1

## 2021-08-23 MED ORDER — ONDANSETRON HCL 4 MG/2ML IJ SOLN: 4.0000 mg | Freq: Four times a day (QID) | INTRAMUSCULAR | Status: DC | PRN

## 2021-08-23 MED ORDER — MOMETASONE FURO-FORMOTEROL FUM 100-5 MCG/ACT IN AERO
2.0000 | INHALATION_SPRAY | Freq: Two times a day (BID) | RESPIRATORY_TRACT | Status: DC
Start: 2021-08-24 — End: 2021-08-26
  Administered 2021-08-24 – 2021-08-26 (×4): 2 via RESPIRATORY_TRACT
  Filled 2021-08-23: qty 8.8

## 2021-08-23 MED ORDER — METOPROLOL TARTRATE 25 MG PO TABS
25.0000 mg | ORAL_TABLET | Freq: Two times a day (BID) | ORAL | Status: DC
  Administered 2021-08-24 – 2021-08-26 (×5): 25 mg via ORAL
  Filled 2021-08-23 (×5): qty 1

## 2021-08-23 MED ORDER — SODIUM CHLORIDE 0.9% FLUSH
3.0000 mL | Freq: Two times a day (BID) | INTRAVENOUS | Status: DC
  Administered 2021-08-23 – 2021-08-26 (×6): 3 mL via INTRAVENOUS

## 2021-08-23 MED ORDER — UMECLIDINIUM BROMIDE 62.5 MCG/ACT IN AEPB
1.0000 | INHALATION_SPRAY | Freq: Every day | RESPIRATORY_TRACT | Status: DC
  Administered 2021-08-26: 1 via RESPIRATORY_TRACT
  Filled 2021-08-23 (×2): qty 7

## 2021-08-23 MED ORDER — PRAVASTATIN SODIUM 20 MG PO TABS
10.0000 mg | ORAL_TABLET | Freq: Every evening | ORAL | Status: DC
  Administered 2021-08-24: 10 mg via ORAL
  Filled 2021-08-23: qty 1

## 2021-08-23 MED ORDER — IPRATROPIUM-ALBUTEROL 0.5-2.5 (3) MG/3ML IN SOLN
3.0000 mL | Freq: Once | RESPIRATORY_TRACT | Status: AC
Start: 2021-08-23 — End: 2021-08-23
  Administered 2021-08-23: 3 mL via RESPIRATORY_TRACT
  Filled 2021-08-23: qty 3

## 2021-08-23 MED ORDER — IPRATROPIUM-ALBUTEROL 0.5-2.5 (3) MG/3ML IN SOLN
3.0000 mL | Freq: Four times a day (QID) | RESPIRATORY_TRACT | Status: DC
  Administered 2021-08-23 – 2021-08-25 (×9): 3 mL via RESPIRATORY_TRACT
  Filled 2021-08-23 (×9): qty 3

## 2021-08-23 MED ORDER — DIAZEPAM 5 MG/ML IJ SOLN
2.5000 mg | Freq: Once | INTRAMUSCULAR | Status: AC
  Administered 2021-08-23: 2.5 mg via INTRAVENOUS
  Filled 2021-08-23: qty 2

## 2021-08-23 MED ORDER — ENOXAPARIN SODIUM 40 MG/0.4ML IJ SOSY: 40.0000 mg | PREFILLED_SYRINGE | INTRAMUSCULAR | Status: DC

## 2021-08-23 MED ORDER — ALLOPURINOL 100 MG PO TABS
100.0000 mg | ORAL_TABLET | Freq: Every day | ORAL | Status: DC
  Administered 2021-08-25 – 2021-08-26 (×2): 100 mg via ORAL
  Filled 2021-08-23 (×2): qty 1

## 2021-08-23 MED ORDER — NITROGLYCERIN 0.4 MG SL SUBL: 0.4000 mg | SUBLINGUAL_TABLET | SUBLINGUAL | Status: DC | PRN

## 2021-08-23 MED ORDER — FINASTERIDE 5 MG PO TABS
5.0000 mg | ORAL_TABLET | Freq: Every day | ORAL | Status: DC
  Administered 2021-08-25 – 2021-08-26 (×2): 5 mg via ORAL
  Filled 2021-08-23 (×2): qty 1

## 2021-08-23 NOTE — ED Notes (Signed)
Mitchell [name] was taken off of Bipap and placed on 4L HFNC salter and a Duoneb SVN tx given.  BIPap on standby. ?O2 sat=95% RR=15, HR=76. ?

## 2021-08-23 NOTE — H&P (Signed)
?[facility] ?History and Physical ? ?Mitchell [name] [id number] DOB: 1940/12/26 DOA: 08/23/2021 ? ?Referring physician:  ?PCP: Lawerance Cruel, MD  ? ?Chief Complaint: SOB ? ?HPI: Mitchell [name] is a 81 y.o. WM PMHx  COPD on 2-3 L O2 home oxygen, CAD, chronic diastolic CHF (last echo [id number]), Hx NSTEMI,  ? ?Presents to the emergency department for evaluation of worsening shortness of breath.  Symptoms been gradually worsening over the past 2 to 3 weeks.  No fevers or chills.  Patient describes a generalized chest tightness from time to time but no exertional or pleuritic pain.  He states he has had significant increase in his weight and that is closer not fitting properly.  He is feeling swollen all over.  He lives at home by himself and is accompanied by his ex-wife who came to pick him up for his pulmonary appointment this morning but ultimately called EMS due to his significant dyspnea with exertion.  States weighs himself daily.  States base weight 205 pounds (92.9 kg).  States lives alone ? ? ? ?Review of Systems:  ?Covid vaccination; vaccinated 3/3 ? ?Constitutional:  ?No weight loss, night sweats, Fevers, chills, fatigue.  ?HEENT:  ?No headaches, Difficulty swallowing,Tooth/dental problems,Sore throat,  ?No sneezing, itching, ear ache, nasal congestion, post nasal drip,  ?Cardio-vascular:  ?No chest pain, Orthopnea, PND, swelling in lower extremities, positive anasarca, dizziness, palpitations  ?GI:  ?No heartburn, indigestion, abdominal pain, nausea, vomiting, diarrhea, change in bowel habits, loss of appetite  ?Resp:  ?positive shortness of breath with exertion or at rest. No excess mucus, no productive cough, No non-productive cough, No coughing up of blood.No change in color of mucus.No wheezing.No chest wall deformity  ?Skin:  ?no rash or lesions.  ?GU:  ?no dysuria, change in color of urine, no urgency or frequency. No flank pain.  ?Musculoskeletal:  ?positive joint pain or swelling. No  decreased range of motion. No back pain.  ?Psych:  ?No change in mood or affect. No depression or anxiety. No memory loss.  ? ?Past Medical History:  ?Diagnosis Date  ? Anginal pain ([place]) 01/2012  ? Anxiety   ? Atherosclerotic heart disease of native coronary artery without angina pectoris   ? BPH (benign prostatic hypertrophy)   ? CAD (coronary artery disease) 2007  ? with LAD/Diag CBPTCA and kissing   ? CKD (chronic kidney disease), stage III ([place])   ? Archie Endo 01/04/2017  ? COPD (chronic obstructive pulmonary disease) ([place])   ? "maybe" (02/14/2012)  ? Coronary artery disease   ? Depression   ? GERD (gastroesophageal reflux disease)   ? Hyperlipidemia   ? Hypertension   ? Hypoxemia   ? Myocardial infarction ([place]) 01/22/2012  ? NSTEMI with DES to RSA (PDA)  ? On home oxygen therapy   ? Shortness of breath   ? "walking and laying down sometimes" (02/14/2012)  ? ?Past Surgical History:  ?Procedure Laterality Date  ? CATARACT EXTRACTION W/ INTRAOCULAR LENS  IMPLANT, BILATERAL  ~ 2010  ? CORONARY ANGIOPLASTY WITH STENT PLACEMENT  05/11/2005  ? PCI OF LAD  ? GANGLION CYST REMOVED  1956?  ? right  ? LEFT HEART CATHETERIZATION WITH CORONARY ANGIOGRAM Bilateral 01/24/2012  ? Procedure: LEFT HEART CATHETERIZATION WITH CORONARY ANGIOGRAM;  Surgeon: Sinclair Grooms, MD;  Location: [facility] CATH LAB;  Service: Cardiovascular;  Laterality: Bilateral;  ? REFRACTIVE SURGERY  2010  ? left  ? ?Social History:  reports that he quit smoking about 10 years ago.  His smoking use included pipe. He has never used smokeless tobacco. He reports that he does not currently use alcohol after a past usage of about 7.0 standard drinks per week. He reports that he does not use drugs. ? ?Allergies  ?Allergen Reactions  ? Lipitor [Atorvastatin] Other (See Comments)  ?  "just paralyzed me; I can't take it"  ? Statins Other (See Comments)  ?  "paralyzed me; I can't take it"  ? Rosuvastatin   ?  Leg aches  ? ? ?Family History  ?Problem Relation Age of Onset  ?  Heart failure Mother   ? Heart failure Brother   ?  ? ?Prior to Admission medications   ?Medication Sig Start Date End Date Taking? Authorizing Provider  ?acetaminophen (TYLENOL) 500 MG tablet Take 500 mg by mouth every 6 (six) hours as needed.    [provider]  ?albuterol (PROVENTIL HFA;VENTOLIN HFA) 108 (90 BASE) MCG/ACT inhaler Inhale 2 puffs into the lungs every 6 (six) hours as needed for wheezing. Reported on 05/04/2015    [provider]  ?albuterol (PROVENTIL) (2.5 MG/3ML) 0.083% nebulizer solution Take 3 mLs (2.5 mg total) by nebulization every 6 (six) hours as needed for wheezing or shortness of breath. 05/19/21   Martyn Ehrich, NP  ?allopurinol (ZYLOPRIM) 100 MG tablet Take 100 mg by mouth daily. 11/07/20   [provider]  ?azithromycin (ZITHROMAX) 250 MG tablet Take as directed 08/04/21   Freddi Starr, MD  ?Budeson-Glycopyrrol-Formoterol (BREZTRI AEROSPHERE) 160-9-4.8 MCG/ACT AERO Inhale 2 puffs into the lungs in the morning and at bedtime. 08/04/21   Freddi Starr, MD  ?Budeson-Glycopyrrol-Formoterol (BREZTRI AEROSPHERE) 160-9-4.8 MCG/ACT AERO Inhale 2 puffs into the lungs in the morning and at bedtime. 08/04/21   Freddi Starr, MD  ?diclofenac (FLECTOR) 1.3 % PTCH Place 1 patch onto the skin 2 (two) times daily. If not affordable, pls substitute OTC Voltaren Gel or cream--thanks 05/05/21   Nita Sells, MD  ?ELIQUIS 5 MG TABS tablet TAKE 1 TABLET (5 MG TOTAL) BY MOUTH 2 (TWO) TIMES DAILY. NEED ANNUAL MD APPT FOR REFILLS 04/13/21   Belva Crome, MD  ?finasteride (PROSCAR) 5 MG tablet Take 5 mg by mouth daily. Reported on 05/04/2015    [provider]  ?furosemide (LASIX) 40 MG tablet Take 40 mg by mouth 2 (two) times daily. 05/29/18   [provider]  ?guaiFENesin (MUCINEX) 600 MG 12 hr tablet Take 1 tablet (600 mg total) by mouth 2 (two) times daily. 05/19/21   Martyn Ehrich, NP  ?metoprolol tartrate (LOPRESSOR) 25 MG tablet TAKE 1  TABLET TWICE DAILY 03/22/20   Belva Crome, MD  ?montelukast (SINGULAIR) 10 MG tablet Take 1 tablet (10 mg total) by mouth at bedtime. 08/04/21   Freddi Starr, MD  ?mupirocin ointment (BACTROBAN) 2 % Apply 1 application topically 2 (two) times daily. 04/18/21   [provider]  ?nitroGLYCERIN (NITROSTAT) 0.4 MG SL tablet Place 1 tablet (0.4 mg total) under the tongue every 5 (five) minutes as needed. Reported on 05/04/2015 08/20/17   Isaiah Serge, NP  ?potassium chloride (KLOR-CON 10) 10 MEQ tablet Take 1 tablet (10 mEq total) by mouth daily. 12/25/17   Belva Crome, MD  ?pravastatin (PRAVACHOL) 20 MG tablet TAKE 1/2 TABLET EVERY EVENING 02/24/21   Belva Crome, MD  ?QUEtiapine (SEROQUEL) 50 MG tablet Take 50 mg by mouth at bedtime.    [provider]  ? ? ? ?Consultants:  ? ? ?  Procedures/Significant Events:  ? ? ?I have personally reviewed and interpreted all radiology studies and my findings are as above. ? ? ?VENTILATOR SETTINGS: ?BiPAP 5/10 ?Set rate 14 ?Flow 35 L/min ?BiPAP 10 ?EPAP 5 ? ? ?Cultures ?5/10 influenza A/B negative ?5/10 SARS coronavirus negative ?5/10 respiratory virus panel pending ? ? ?Antimicrobials: ?Anti-infectives (From admission, onward)  ? ? None  ? ?  ?  ? ? ?Devices ?  ? ?LINES / TUBES:  ? ? ? ? ?Continuous Infusions: ? ?Physical Exam: ?Vitals:  ? 08/23/21 1730 08/23/21 1800 08/23/21 1830 08/23/21 1900  ?BP: 138/86 (!) 129/94 (!) 149/85 138/90  ?Pulse: 69 74 71 73  ?Resp: 11 15 17 12   ?Temp:      ?TempSrc:      ?SpO2: 100% 99% 99% 98%  ?Weight:      ?Height:      ? ? ?Wt Readings from Last 3 Encounters:  ?08/23/21 107.5 kg  ?05/19/21 109 kg  ?05/05/21 108.3 kg  ? ? ?General: positive acute respiratory distress, hard of hearing, legally blind ?Eyes: negative scleral hemorrhage, negative anisocoria, negative icterus ?ENT: Negative Runny nose, negative gingival bleeding, ?Neck:  Negative scars, masses, torticollis, lymphadenopathy, JVD ?Lungs: Clear to auscultation  bilaterally, positive expiratory wheezes, negative crackles ?Cardiovascular: Regular rate and rhythm without murmur gallop or rub normal S1 and S2 ?Abdomen: MORBIDLY OBESE, negative abdominal pain, pos

## 2021-08-23 NOTE — Progress Notes (Deleted)
Synopsis: Referred for acute dyspnea by Mitchell Cruel, MD  Subjective:   PATIENT ID: Mitchell [name] GENDER: male DOB: 12-Sep-1940, MRN: [id number]  No chief complaint on file.  Mitchell [name] is an 81 year old male, former smoker with CAD, CKD stage III, GERD, hypertension, chronic hypoxemic respiratory failure due to emphysema who returns to pulmonary clinic for COPD.   He was seen for AECOPD by Dr. Erin Fulling 08/04/21 treated with prednisone taper and z pack. Prior to that visit he'd run out of trelegy for a couple weeks and was started on breztri. He worried about adherence to optimal inhaler treatments given his poor eyesight, hearing.   Otherwise pertinent review of systems is negative.  Past Medical History:  Diagnosis Date   Anginal pain ([place]) 01/2012   Anxiety    Atherosclerotic heart disease of native coronary artery without angina pectoris    BPH (benign prostatic hypertrophy)    CAD (coronary artery disease) 2007   with LAD/Diag CBPTCA and kissing    CKD (chronic kidney disease), stage III ([place])    Archie Endo 01/04/2017   COPD (chronic obstructive pulmonary disease) ([place])    "maybe" (02/14/2012)   Coronary artery disease    Depression    GERD (gastroesophageal reflux disease)    Hyperlipidemia    Hypertension    Hypoxemia    Myocardial infarction ([place]) 01/22/2012   NSTEMI with DES to RSA (PDA)   On home oxygen therapy    Shortness of breath    "walking and laying down sometimes" (02/14/2012)     Family History  Problem Relation Age of Onset   Heart failure Mother    Heart failure Brother      Past Surgical History:  Procedure Laterality Date   CATARACT EXTRACTION W/ INTRAOCULAR LENS  IMPLANT, BILATERAL  ~ 2010   CORONARY ANGIOPLASTY WITH STENT PLACEMENT  05/11/2005   PCI OF LAD   GANGLION CYST REMOVED  1956?   right   LEFT HEART CATHETERIZATION WITH CORONARY ANGIOGRAM Bilateral 01/24/2012   Procedure: LEFT HEART CATHETERIZATION WITH CORONARY ANGIOGRAM;   Surgeon: Sinclair Grooms, MD;  Location: [facility] CATH LAB;  Service: Cardiovascular;  Laterality: Bilateral;   REFRACTIVE SURGERY  2010   left    Social History   Socioeconomic History   Marital status: Single    Spouse name: Not on file   Number of children: 3   Years of education: Not on file   Highest education level: Not on file  Occupational History   Not on file  Tobacco Use   Smoking status: Former    Types: Pipe    Quit date: 04/28/2011    Years since quitting: 10.3   Smokeless tobacco: Never  Vaping Use   Vaping Use: Never used  Substance and Sexual Activity   Alcohol use: Not Currently    Alcohol/week: 7.0 standard drinks    Types: 7 Standard drinks or equivalent per week    Comment: 02/14/2012 "weekends I have 6-7 mixed drinks"     occasional   Drug use: No   Sexual activity: Yes  Other Topics Concern   Not on file  Social History Narrative   Truck driver      Social Determinants of Health   Financial Resource Strain: Not on file  Food Insecurity: Not on file  Transportation Needs: Not on file  Physical Activity: Not on file  Stress: Not on file  Social Connections: Not on file  Intimate Partner Violence: Not on  file     Allergies  Allergen Reactions   Lipitor [Atorvastatin] Other (See Comments)    "just paralyzed me; I can't take it"   Statins Other (See Comments)    "paralyzed me; I can't take it"   Rosuvastatin     Leg aches     Outpatient Medications Prior to Visit  Medication Sig Dispense Refill   acetaminophen (TYLENOL) 500 MG tablet Take 500 mg by mouth every 6 (six) hours as needed.     albuterol (PROVENTIL HFA;VENTOLIN HFA) 108 (90 BASE) MCG/ACT inhaler Inhale 2 puffs into the lungs every 6 (six) hours as needed for wheezing. Reported on 05/04/2015     albuterol (PROVENTIL) (2.5 MG/3ML) 0.083% nebulizer solution Take 3 mLs (2.5 mg total) by nebulization every 6 (six) hours as needed for wheezing or shortness of breath. 75 mL 12    allopurinol (ZYLOPRIM) 100 MG tablet Take 100 mg by mouth daily.     azithromycin (ZITHROMAX) 250 MG tablet Take as directed 6 tablet 0   Budeson-Glycopyrrol-Formoterol (BREZTRI AEROSPHERE) 160-9-4.8 MCG/ACT AERO Inhale 2 puffs into the lungs in the morning and at bedtime. 10.7 g 6   Budeson-Glycopyrrol-Formoterol (BREZTRI AEROSPHERE) 160-9-4.8 MCG/ACT AERO Inhale 2 puffs into the lungs in the morning and at bedtime. 11.8 g 0   diclofenac (FLECTOR) 1.3 % PTCH Place 1 patch onto the skin 2 (two) times daily. If not affordable, pls substitute OTC Voltaren Gel or cream--thanks 20 patch 0   ELIQUIS 5 MG TABS tablet TAKE 1 TABLET (5 MG TOTAL) BY MOUTH 2 (TWO) TIMES DAILY. NEED ANNUAL MD APPT FOR REFILLS 180 tablet 1   finasteride (PROSCAR) 5 MG tablet Take 5 mg by mouth daily. Reported on 05/04/2015     furosemide (LASIX) 40 MG tablet Take 40 mg by mouth 2 (two) times daily.     guaiFENesin (MUCINEX) 600 MG 12 hr tablet Take 1 tablet (600 mg total) by mouth 2 (two) times daily. 60 tablet 1   metoprolol tartrate (LOPRESSOR) 25 MG tablet TAKE 1 TABLET TWICE DAILY 180 tablet 1   montelukast (SINGULAIR) 10 MG tablet Take 1 tablet (10 mg total) by mouth at bedtime. 30 tablet 11   mupirocin ointment (BACTROBAN) 2 % Apply 1 application topically 2 (two) times daily.     nitroGLYCERIN (NITROSTAT) 0.4 MG SL tablet Place 1 tablet (0.4 mg total) under the tongue every 5 (five) minutes as needed. Reported on 05/04/2015 25 tablet 3   potassium chloride (KLOR-CON 10) 10 MEQ tablet Take 1 tablet (10 mEq total) by mouth daily. 90 tablet 3   pravastatin (PRAVACHOL) 20 MG tablet TAKE 1/2 TABLET EVERY EVENING 45 tablet 3   QUEtiapine (SEROQUEL) 50 MG tablet Take 50 mg by mouth at bedtime.     No facility-administered medications prior to visit.       Objective:   Physical Exam:  General appearance: 81 y.o., male, NAD, conversant  Eyes: anicteric sclerae; PERRL, tracking appropriately HENT: NCAT; MMM Neck:  Trachea midline; no lymphadenopathy, no JVD Lungs: CTAB, no crackles, no wheeze, with normal respiratory effort CV: RRR, no murmur  Abdomen: Soft, non-tender; non-distended, BS present  Extremities: No peripheral edema, warm Skin: Normal turgor and texture; no rash Psych: Appropriate affect Neuro: Alert and oriented to person and place, no focal deficit     There were no vitals filed for this visit.   on *** LPM *** RA BMI Readings from Last 3 Encounters:  08/04/21 35.47 kg/m  05/19/21 35.47 kg/m  05/05/21 34.26 kg/m   Wt Readings from Last 3 Encounters:  05/19/21 240 lb 3.2 oz (109 kg)  05/05/21 238 lb 12.8 oz (108.3 kg)  01/27/21 235 lb 3.2 oz (106.7 kg)     CBC    Component Value Date/Time   WBC 13.8 (H) 05/04/2021 0406   RBC 5.34 05/04/2021 0406   HGB 15.8 05/04/2021 0406   HGB 15.9 01/30/2021 0000   HCT 48.2 05/04/2021 0406   HCT 46.1 01/30/2021 0000   PLT 188 05/04/2021 0406   PLT 176 01/30/2021 0000   MCV 90.3 05/04/2021 0406   MCV 87 01/30/2021 0000   MCH 29.6 05/04/2021 0406   MCHC 32.8 05/04/2021 0406   RDW 14.5 05/04/2021 0406   RDW 12.9 01/30/2021 0000   LYMPHSABS 0.9 05/04/2021 0406   MONOABS 1.0 05/04/2021 0406   EOSABS 0.0 05/04/2021 0406   BASOSABS 0.0 05/04/2021 0406    ***  Chest Imaging: ***  Pulmonary Functions Testing Results:     View : No data to display.           Echocardiogram: ***  Heart Catheterization: ***    Assessment & Plan:    # pulmonary nodules  Plan:      Maryjane Hurter, MD [facility] 08/23/2021 12:42 PM

## 2021-08-23 NOTE — ED Triage Notes (Signed)
Pt bib ems for SOB and CHF fluid overload.  Pt came in from home. Pt states he has been using albuterol nebs at home w/out relief and lasix has not been pulling fluid off.  Pt states he was too short of breath to make it to pulm clinic today.   ?

## 2021-08-23 NOTE — ED Provider Triage Note (Signed)
Emergency Medicine Provider Triage Evaluation Note ? ?Mitchell [name] , a 81 y.o. male  was evaluated in triage.  Pt complains of dyspnea..  Worsening DIB over the past 3 days.  Minimal relief with home nebulizer.  Uses 2.5 L nasal cannula at all times.  Worse exertional dyspnea.  Difficult ambulating more than 5 to 10 feet without becoming severely dyspneic.  Worsening leg swelling, worsening abdominal swelling.  Compliant with Lasix.  Urinating "like a racehorse" per patient. ? ?Review of Systems  ?Positive: Dyspnea, chest tightness weight gain ?Negative: Fevers, chills, nausea or vomiting ? ?Physical Exam  ?Ht 6' (1.829 m)   Wt 107.5 kg   SpO2 99%   BMI 32.14 kg/m?  ?Gen:   Awake, mild respiratory distress ?Resp:  Tachypnea, rales bilateral ?MSK:   Moves extremities without difficulty  ?Other:  Calm cooperative ? ?Medical Decision Making  ?Medically screening exam initiated at 2:39 PM.  Appropriate orders placed.  [name] was informed that the remainder of the evaluation will be completed by another provider, this initial triage assessment does not replace that evaluation, and the importance of remaining in the ED until their evaluation is complete. ? ? ?  ?Sloan Leiter, DO ?08/23/21 1904 ? ?

## 2021-08-23 NOTE — ED Provider Notes (Signed)
? ?Emergency Department Provider Note ? ? ?I have reviewed the triage vital signs and the nursing notes. ? ? ?HISTORY ? ?Chief Complaint ?Shortness of Breath and Leg Swelling ? ? ?HPI ?Mitchell [name] is a 81 y.o. male who with past medical history of COPD on home oxygen, CAD, congestive heart failure presents to the emergency department for evaluation of worsening shortness of breath.  Symptoms been gradually worsening over the past 2 to 3 weeks.  No fevers or chills.  Patient describes a generalized chest tightness from time to time but no exertional or pleuritic pain.  He states he has had significant increase in his weight and that is closer not fitting properly.  He is feeling swollen all over.  He lives at home by himself and is accompanied by his ex-wife who came to pick him up for his pulmonary appointment this morning but ultimately called EMS due to his significant dyspnea with exertion.  ? ?Past Medical History:  ?Diagnosis Date  ? Anginal pain (HCC) 01/2012  ? Anxiety   ? Atherosclerotic heart disease of native coronary artery without angina pectoris   ? BPH (benign prostatic hypertrophy)   ? CAD (coronary artery disease) 2007  ? with LAD/Diag CBPTCA and kissing   ? CKD (chronic kidney disease), stage III (HCC)   ? Hattie Perch 01/04/2017  ? COPD (chronic obstructive pulmonary disease) (HCC)   ? "maybe" (02/14/2012)  ? Coronary artery disease   ? Depression   ? GERD (gastroesophageal reflux disease)   ? Hyperlipidemia   ? Hypertension   ? Hypoxemia   ? Myocardial infarction (HCC) 01/22/2012  ? NSTEMI with DES to RSA (PDA)  ? On home oxygen therapy   ? Shortness of breath   ? "walking and laying down sometimes" (02/14/2012)  ? ? ?Review of Systems ? ?Constitutional: No fever/chills ?Eyes: No visual changes. ?ENT: No sore throat. ?Cardiovascular: Positive constant chest pain and weight gain.  ?Respiratory: Positive shortness of breath. ?Gastrointestinal: No abdominal pain.  No nausea, no vomiting.  No diarrhea.   No constipation. ?Genitourinary: Negative for dysuria. ?Musculoskeletal: Negative for back pain. ?Skin: Negative for rash. ?Neurological: Negative for headaches. ? ? ?____________________________________________ ? ? ?PHYSICAL EXAM: ? ?VITAL SIGNS: ?ED Triage Vitals  ?Enc Vitals Group  ?   BP 08/23/21 1430 (!) 141/85  ?   Pulse Rate 08/23/21 1430 67  ?   Resp 08/23/21 1430 19  ?   Temp 08/23/21 1441 98.2 ?F (36.8 ?C)  ?   Temp Source 08/23/21 1441 Oral  ?   SpO2 08/23/21 1430 98 %  ?   Weight 08/23/21 1435 237 lb (107.5 kg)  ?   Height 08/23/21 1435 6' (1.829 m)  ? ?Constitutional: Alert and oriented. Patient with NRB in place but speaking and able to provide a full history. Mild increased WOB noted.  ?Eyes: Conjunctivae are normal. ?Head: Atraumatic. ?Nose: No congestion/rhinnorhea. ?Mouth/Throat: Mucous membranes are moist.   ?Neck: No stridor. ?Cardiovascular: Normal rate, regular rhythm. Good peripheral circulation. Grossly normal heart sounds.   ?Respiratory: Increased respiratory effort.  No retractions. Lungs with mild end-expiratory wheeze and crackles at the bases.  ?Gastrointestinal: Soft and nontender. No distention.  ?Musculoskeletal: No lower extremity tenderness with 1+ pitting edema bilaterally. No gross deformities of extremities. ?Neurologic:  Normal speech and language. No gross focal neurologic deficits are appreciated.  ?Skin:  Skin is warm, dry and intact. No rash noted. ? ?____________________________________________ ?  ?LABS ?(all labs ordered are listed, but only abnormal  results are displayed) ? ?Labs Reviewed  ?BLOOD GAS, VENOUS - Abnormal; Notable for the following components:  ?    Result Value  ? pCO2, Ven 75 (*)   ? pO2, Ven <31 (*)   ? Bicarbonate 36.9 (*)   ? Acid-Base Excess 7.6 (*)   ? All other components within normal limits  ?BASIC METABOLIC PANEL - Abnormal; Notable for the following components:  ? CO2 35 (*)   ? Anion gap 3 (*)   ? All other components within normal limits   ?RESP PANEL BY RT-PCR (FLU A&B, COVID) [id number]  ?BRAIN NATRIURETIC PEPTIDE  ?TROPONIN I (HIGH SENSITIVITY)  ?TROPONIN I (HIGH SENSITIVITY)  ? ?____________________________________________ ? ?EKG ? ? EKG Interpretation ? ?Date/Time:  Wednesday Aug 23 2021 14:38:14 EDT ?Ventricular Rate:  65 ?PR Interval:  212 ?QRS Duration: 79 ?QT Interval:  410 ?QTC Calculation: 427 ?R Axis:   66 ?Text Interpretation: Sinus arrhythmia Borderline prolonged PR interval Confirmed by Nanda Quinton [id number]) on 08/23/2021 4:55:38 PM ?  ? ?  ? ? ?____________________________________________ ? ?RADIOLOGY ? ?DG Chest Port 1 View ? ?Result Date: 08/23/2021 ?CLINICAL DATA:  Shortness of breath. EXAM: PORTABLE CHEST 1 VIEW COMPARISON:  05/01/2021 FINDINGS: The cardiac silhouette, mediastinal and hilar contours are normal and stable. The lungs are clear. No pleural effusions. No pulmonary lesions. IMPRESSION: No acute cardiopulmonary findings. Electronically Signed   By: Marijo Sanes M.D.   On: 08/23/2021 14:53   ? ?____________________________________________ ? ? ?PROCEDURES ? ?Procedure(s) performed:  ? ?.Critical Care ?Performed by: Margette Fast, MD ?Authorized by: Margette Fast, MD  ? ?Critical care provider statement:  ?  Critical care time (minutes):  35 ?  Critical care time was exclusive of:  Separately billable procedures and treating other patients and teaching time ?  Critical care was necessary to treat or prevent imminent or life-threatening deterioration of the following conditions:  Respiratory failure ?  Critical care was time spent personally by me on the following activities:  Development of treatment plan with patient or surrogate, discussions with consultants, evaluation of patient's response to treatment, examination of patient, ordering and review of laboratory studies, ordering and review of radiographic studies, ordering and performing treatments and interventions, pulse oximetry, re-evaluation of patient's condition,  review of old charts, blood draw for specimens and obtaining history from patient or surrogate ?  I assumed direction of critical care for this patient from another provider in my specialty: no   ?  Care discussed with: admitting provider   ? ? ?____________________________________________ ? ? ?INITIAL IMPRESSION / ASSESSMENT AND PLAN / ED COURSE ? ?Pertinent labs & imaging results that were available during my care of the patient were reviewed by me and considered in my medical decision making (see chart for details). ?  ?This patient is Presenting for Evaluation of SOB, which does require a range of treatment options, and is a complaint that involves a high risk of morbidity and mortality. ? ?The Differential Diagnoses includes but is not exclusive to acute coronary syndrome, aortic dissection, pulmonary embolism, cardiac tamponade, community-acquired pneumonia, pericarditis, musculoskeletal chest wall pain, etc. ? ? ?Critical Interventions-  ?  ?Medications  ?furosemide (LASIX) injection 40 mg (40 mg Intravenous Given 08/23/21 1449)  ?ipratropium-albuterol (DUONEB) 0.5-2.5 (3) MG/3ML nebulizer solution 3 mL (3 mLs Nebulization Given 08/23/21 1714)  ? ? ?Reassessment after intervention: Patient beginning to diurese. Tolerating Bipap.  ? ? ?I did obtain Additional Historical Information from ex wife at bedside. ? ?I decided  to review pertinent External Data, and in summary last ECHO was 2018 with EF 55-60%. ?  ?Clinical Laboratory Tests Ordered, included mild respiratory acidosis with pH of 7.3 and CO2 of 75.  BNP of 40 and troponin of 3.  COVID and flu negative.  ? ?Radiologic Tests Ordered, included CXR. I independently interpreted the images and agree with radiology interpretation.  ? ?Cardiac Monitor Tracing which shows NSR. ? ? ?Social Determinants of Health Risk patient is a former smoker.  ? ?Consult complete with Hospitalist. Plan for admit.  ? ?Medical Decision Making: Summary:  ?Patient presents emergency  department for evaluation of increased shortness of breath over the past several weeks along with sudden weight gain and subjective swelling.  BNP is within normal limits and no clear pulmonary edema on chest

## 2021-08-24 ENCOUNTER — Inpatient Hospital Stay (HOSPITAL_COMMUNITY): Payer: Medicare PPO

## 2021-08-24 DIAGNOSIS — N1832 Chronic kidney disease, stage 3b: Secondary | ICD-10-CM

## 2021-08-24 DIAGNOSIS — I5033 Acute on chronic diastolic (congestive) heart failure: Secondary | ICD-10-CM

## 2021-08-24 DIAGNOSIS — J9621 Acute and chronic respiratory failure with hypoxia: Secondary | ICD-10-CM | POA: Diagnosis not present

## 2021-08-24 DIAGNOSIS — J9601 Acute respiratory failure with hypoxia: Secondary | ICD-10-CM | POA: Diagnosis not present

## 2021-08-24 DIAGNOSIS — M62838 Other muscle spasm: Secondary | ICD-10-CM | POA: Diagnosis present

## 2021-08-24 DIAGNOSIS — N400 Enlarged prostate without lower urinary tract symptoms: Secondary | ICD-10-CM | POA: Diagnosis not present

## 2021-08-24 LAB — LIPID PANEL
Cholesterol: 202 mg/dL — ABNORMAL HIGH (ref 0–200)
HDL: 63 mg/dL (ref >40)
LDL Cholesterol: 117 mg/dL — ABNORMAL HIGH (ref 0–99)
Total CHOL/HDL Ratio: 3.2 RATIO
Triglycerides: 108 mg/dL (ref <150)
VLDL: 22 mg/dL (ref 0–40)

## 2021-08-24 LAB — RESPIRATORY PANEL BY PCR

## 2021-08-24 LAB — COMPREHENSIVE METABOLIC PANEL
ALT: 16 U/L (ref 0–44)
AST: 15 U/L (ref 15–41)
Albumin: 3.4 g/dL — ABNORMAL LOW (ref 3.5–5.0)
Alkaline Phosphatase: 53 U/L (ref 38–126)
Anion gap: 8 (ref 5–15)
BUN: 24 mg/dL — ABNORMAL HIGH (ref 8–23)
CO2: 33 mmol/L — ABNORMAL HIGH (ref 22–32)
Calcium: 8.9 mg/dL (ref 8.9–10.3)
Chloride: 97 mmol/L — ABNORMAL LOW (ref 98–111)
Creatinine, Ser: 1.5 mg/dL — ABNORMAL HIGH (ref 0.61–1.24)
GFR, Estimated: 46 mL/min — ABNORMAL LOW (ref >60)
Glucose, Bld: 102 mg/dL — ABNORMAL HIGH (ref 70–99)
Potassium: 4.1 mmol/L (ref 3.5–5.1)
Sodium: 138 mmol/L (ref 135–145)
Total Bilirubin: 1 mg/dL (ref 0.3–1.2)
Total Protein: 6 g/dL — ABNORMAL LOW (ref 6.5–8.1)

## 2021-08-24 LAB — ECHOCARDIOGRAM COMPLETE
AR max vel: 2.15 cm2
AV Area VTI: 2.35 cm2
AV Area mean vel: 1.99 cm2
AV Mean grad: 6 mmHg
AV Peak grad: 9.6 mmHg
Ao pk vel: 1.55 m/s
Area-P 1/2: 2.6 cm2
Height: 72 in
S' Lateral: 2.2 cm
Weight: 3791.91 oz

## 2021-08-24 LAB — PHOSPHORUS: Phosphorus: 3.9 mg/dL (ref 2.5–4.6)

## 2021-08-24 LAB — MAGNESIUM: Magnesium: 2.2 mg/dL (ref 1.7–2.4)

## 2021-08-24 LAB — URIC ACID: Uric Acid, Serum: 7.6 mg/dL (ref 3.7–8.6)

## 2021-08-24 MED ORDER — MELATONIN 3 MG PO TABS
3.0000 mg | ORAL_TABLET | Freq: Once | ORAL | Status: AC
  Administered 2021-08-24: 3 mg via ORAL
  Filled 2021-08-24: qty 1

## 2021-08-24 MED ORDER — FUROSEMIDE 40 MG PO TABS: 40.0000 mg | ORAL_TABLET | Freq: Two times a day (BID) | ORAL | Status: DC

## 2021-08-24 MED ORDER — PRAVASTATIN SODIUM 20 MG PO TABS
20.0000 mg | ORAL_TABLET | Freq: Every evening | ORAL | Status: DC
Start: 2021-08-24 — End: 2021-08-26
  Administered 2021-08-24 – 2021-08-25 (×2): 20 mg via ORAL
  Filled 2021-08-24 (×2): qty 1

## 2021-08-24 MED ORDER — TIZANIDINE HCL 4 MG PO TABS
2.0000 mg | ORAL_TABLET | Freq: Three times a day (TID) | ORAL | Status: DC | PRN
  Administered 2021-08-24 – 2021-08-25 (×3): 2 mg via ORAL
  Filled 2021-08-24 (×3): qty 1

## 2021-08-24 NOTE — Plan of Care (Signed)

## 2021-08-24 NOTE — Progress Notes (Signed)
Patient requested to redo O2 saturation qualification ambulation test on room air tomorrow during day shift. Will let oncoming nurse know.  ?

## 2021-08-24 NOTE — TOC Initial Note (Signed)
Transition of Care (TOC) - Initial/Assessment Note  ? ? ?Patient Details  ?Name: Mitchell [name] ?MRN: [id number] ?Date of Birth: December 15, 1940 ? ?Transition of Care [facility]) CM/SW Contact:    ?Leeroy Cha, RN ?Phone Number: ?08/24/2021, 8:39 AM ? ?Clinical Narrative:                 ?Reuqest for home heart failure screening sent to [place] at [place] ? ?Expected Discharge Plan: [place] ?Barriers to Discharge: No Barriers Identified ? ? ?Patient Goals and CMS Choice ?Patient states their goals for this hospitalization and ongoing recovery are:: to return home ?CMS Medicare.gov Compare Post Acute Care list provided to:: Patient ?  ? ?Expected Discharge Plan and Services ?Expected Discharge Plan: [place] ?  ?Discharge Planning Services: CM Consult ?Post Acute Care Choice: Home Health ?Living arrangements for the past 2 months: Apartment ?                ?  ?  ?  ?  ?  ?HH Arranged: Disease Management ?[place] Agency: [facility] ?Date HH Agency Contacted: 08/24/21 ?Time [place]: [phone] ?Representative spoke with at [place]: arron ? ?Prior Living Arrangements/Services ?Living arrangements for the past 2 months: Apartment ?Lives with:: Self ?Patient language and need for interpreter reviewed:: Yes ?Do you feel safe going back to the place where you live?: Yes      ?  ?  ?  ?Criminal Activity/Legal Involvement Pertinent to Current Situation/Hospitalization: No - Comment as needed ? ?Activities of Daily Living ?Home Assistive Devices/Equipment: Cane (specify quad or straight), Shower chair with back, Oxygen (apartment complex to make a handicap tub) ?ADL Screening (condition at time of admission) ?Patient's cognitive ability adequate to safely complete daily activities?: Yes ?Is the patient deaf or have difficulty hearing?: Yes ?Does the patient have difficulty seeing, even when wearing glasses/contacts?: Yes (macular degeneration in both eyes) ?Does the patient  have difficulty concentrating, remembering, or making decisions?: No ?Patient able to express need for assistance with ADLs?: Yes ?Does the patient have difficulty dressing or bathing?: No ?Independently performs ADLs?: Yes (appropriate for developmental age) ?Does the patient have difficulty walking or climbing stairs?: Yes ?Weakness of Legs: Both ?Weakness of Arms/Hands: Both ? ?Permission Sought/Granted ?  ?  ?   ?   ?   ?   ? ?Emotional Assessment ?Appearance:: Appears stated age ?  ?  ?Orientation: : Oriented to Self, Oriented to  Time, Oriented to Place, Oriented to Situation ?Alcohol / Substance Use: Not Applicable ?Psych Involvement: No (comment) ? ?Admission diagnosis:  Panlobular emphysema ([place]) [J43.1] ?Acute CHF (congestive heart failure) (HCC) [I50.9] ?Acute respiratory failure with hypoxia ([place]) [J96.01] ?Chronic respiratory failure with hypoxia ([place]) [J96.11] ?COPD with acute exacerbation ([place]) [J44.1] ?Coronary artery disease involving native coronary artery of native heart with angina pectoris ([place]) [I25.119] ?Patient Active Problem List  ? Diagnosis Date Noted  ? Acute CHF (congestive heart failure) ([place]) 08/23/2021  ? Acute on chronic respiratory failure with hypoxia ([place]) 08/23/2021  ? Obesity (BMI 30-39.9) 08/23/2021  ? COPD with acute exacerbation ([place]) 05/04/2021  ? Pulmonary nodule 05/02/2021  ? COPD exacerbation ([place]) 05/01/2021  ? Benign prostatic hyperplasia 05/01/2021  ? Chronic congestive heart failure ([place]) 05/01/2021  ? Effusion, right knee 01/10/2017  ? Osteoarthritis of right knee 01/10/2017  ? Gout of right knee 01/10/2017  ? Dehydration   ? Atypical atrial flutter ([place])   ? Dizziness 01/04/2017  ?  Right shoulder pain 01/04/2017  ? Hypokalemia 01/04/2017  ? Influenza 05/10/2016  ? CKD (chronic kidney disease), stage III ([place]) 05/10/2016  ? Acute respiratory failure with hypoxia ([place]) 05/02/2016  ? AKI (acute kidney injury) ([place]) 05/02/2016  ? Gout flare 05/02/2016  ? Chronic  respiratory failure with hypoxia ([place]) 03/15/2015  ? COPD with acute exacerbation ([place]) 01/31/2015  ? Vision impairment 01/31/2015  ? Right foot pain 01/31/2015  ? Obesity 01/31/2015  ? Alcohol use 01/31/2015  ? COPD (chronic obstructive pulmonary disease) ([place]) 04/30/2013  ? Dyspnea 02/14/2012  ? Hyperlipemia   ? History of depression   ? Chest pain 01/23/2012  ? Coronary artery disease involving native coronary artery of native heart with angina pectoris ([place]) 01/23/2012  ? Cholelithiasis   ? Shortness of breath 04/29/2011  ? Essential hypertension 04/29/2011  ? ?PCP:  Lawerance Cruel, MD ?Pharmacy:   ?[id number] [address]  [place] [address] ?Phone: [phone] Fax: [phone] ? ?[facility], [place] ?[place] ?[place] [address] ?Phone: [phone] Fax: [phone] ? ? ? ? ?Social Determinants of Health (SDOH) Interventions ?  ? ?Readmission Risk Interventions ?   ? View : No data to display.  ?  ?  ?  ? ? ? ?

## 2021-08-24 NOTE — Progress Notes (Signed)
Echocardiogram ?2D Echocardiogram has been performed. ? ?Woodley Petzold  Veva Holes ?08/24/2021, 10:40 AM ?

## 2021-08-24 NOTE — Progress Notes (Signed)
?PROGRESS NOTE ? ? ? ?POE RINIKER  [id number] DOB: 1941-03-15 DOA: 08/23/2021 ?PCP: Lawerance Cruel, MD  ? ? ? ?Brief Narrative:  ?Mitchell [name] is a 81 y.o. WM PMHx  COPD on 2-3 L O2 home oxygen, CAD, chronic diastolic CHF (last echo [id number]), Hx NSTEMI,  ?  ?Presents to the emergency department for evaluation of worsening shortness of breath.  Symptoms been gradually worsening over the past 2 to 3 weeks.  No fevers or chills.  Patient describes a generalized chest tightness from time to time but no exertional or pleuritic pain.  He states he has had significant increase in his weight and that is closer not fitting properly.  He is feeling swollen all over.  He lives at home by himself and is accompanied by his ex-wife who came to pick him up for his pulmonary appointment this morning but ultimately called EMS due to his significant dyspnea with exertion.  States weighs himself daily.  States base weight 205 pounds (92.9 kg).  States lives alone ? ? ?Subjective: ?5/11 afebrile overnight A/O x4.  States continues to have severe cramps in multiple areas.  Zanaflex last night did help with the cramping. ? ? ?Assessment & Plan: ?Covid vaccination; ?  ?Principal Problem: ?  Acute CHF (congestive heart failure) ([place]) ?Active Problems: ?  Hyperlipemia ?  History of depression ?  Shortness of breath ?  Coronary artery disease involving native coronary artery of native heart with angina pectoris ([place]) ?  COPD with acute exacerbation ([place]) ?  Obesity ?  CKD (chronic kidney disease), stage III ([place]) ?  Benign prostatic hyperplasia ?  Acute on chronic respiratory failure with hypoxia (HCC) ?  Obesity (BMI 30-39.9) ?  Acute on chronic diastolic CHF (congestive heart failure) ([place]) ?  Muscle spasm ? ?Acute on chronic Diastolic CHF ?-Dr. Daneen Schick  Pt's Cardiologist ?- 5/10 BNP=40 ?- Trend troponin ?- 5/11 Echocardiogram: Consistent with diastolic CHF see results below ?-base weight 205 pounds (92.9 kg) ?-Strict in  and out -1.8 L ?- Daily weight ?Filed Weights  ? 08/23/21 1435 08/24/21 0500  ?Weight: 107.5 kg 107.5 kg  ?-Lasix 60 mg BID ?-Metoprolol 25 mg BID ?-NTG SL PRN ?-5/11 decrease Lasix to 40 mg BID (home dose): (Hold) second dose secondary to creatinine increase, see CKD stage III A below ?-5/11 have sent secure chat to [facility] requesting a follow-up appointment at CHF clinic for patient of Dr. Daneen Schick cardiology, acute on chronic diastolic CHF.  Will be discharged in next 24 to 48 hours.   ?-5/11 nutrition consult:Patient with poor understanding of what a heart healthy diet consists of, please provide education. ?-5/11 upon discharge patient to follow-up HF TOC appointment 09/04/21 @ 9am ?  ?CAD ?- See CHF ?  ?Acute on chronic respiratory failure with hypoxia ?- Currently on BiPAP: Titrate to maintain SPO2 89-95% ?- Budesonide-Glycopyrrol-Formoterol 160 - 9 - 4 0.8 mcg/Act BID ?-DuoNeb QID ?-5/11 respiratory virus panel negative ?-5/11 ambulatory SPO2 pending ?  ?COPD with acute exacerbation ?- See acute on chronic respiratory failure ?  ?CKD stage IIIa (baseline Cr ~1.39) ?-Follow closely with diuresis ?Lab Results  ?Component Value Date  ? CREATININE 1.50 (H) 08/24/2021  ? CREATININE 1.18 08/23/2021  ? CREATININE 1.32 (H) 05/04/2021  ? CREATININE 1.26 (H) 05/02/2021  ? CREATININE 1.42 (H) 05/01/2021  ? ?  ?BPH ?-Finasteride 5 mg daily ?  ?HLD ?- 5/11 LDL= 117.  Goal LDL<70 ?- 5/11 increase Pravastatin  20 mg daily.  Recheck lipid panel in 6 weeks. ?  ?Gout ?-5/11 uric acid level = 7.6; goal uric acid level<6 ?-Allopurinol 100 mg daily ?-5/11 continue current allopurinol dose.  PCP to titrate ? ?Muscle spasms ?- Zanaflex 2 mg TID PRN ? ?Obesity (BMI 32.14 kg/m?.) ?-Address as outpatient with PCP ?  ?Goals of care ?- 5/11 PT/OT consult: Patient with acute on chronic diastolic CHF, COPD exacerbation, evaluate for CIR vs SNF ? ?  ? ?Mobility Assessment (last 72 hours)   ? ? Mobility Assessment    ? ? [place] Name 08/24/21 [id number] 08/23/21 2135  ?  ?  ?  ? Does patient have an order for bedrest or is patient medically unstable No - Continue assessment No - Continue assessment     ? What is the highest level of mobility based on the progressive mobility assessment? Level 3 (Stands with assist) - Balance while standing  and cannot march in place Level 3 (Stands with assist) - Balance while standing  and cannot march in place     ? Is the above level different from baseline mobility prior to current illness? Yes - Recommend PT order Yes - Recommend PT order     ? ?  ?  ? ?  ? ? ? ?@IPAL @ ? ?   ?DVT prophylaxis: Eliquis ?Code Status: Full ?Family Communication: 5/11 ex-wife present at bedside for discussion of plan of care all questions answered ?Status is: Inpatient ? ? ? ?Dispo: The patient is from: Home ?             Anticipated d/c is to: Home ?             Anticipated d/c date is: 2 days ?             Patient currently is not medically stable to d/c. ? ? ? ? ? ?Consultants:  ? ? ?Procedures/Significant Events:  ?5/11 Echocardiogram ?Left Ventricle: LVEF=55 to 60%.  ?-Mild hypokinesis of the left ventricular, basal-mid inferior  ?wall.  ?-Left ventricular diastolic parameters are consistent with Grade I diastolic dysfunction .-Indeterminate filling pressures.  ? ? ?I have personally reviewed and interpreted all radiology studies and my findings are as above. ? ?VENTILATOR SETTINGS: ?Nasal cannula 5/11 ?Flow 4 L/min ?SPO2 97% ? ? ?Cultures ?5/10 influenza A/B negative ?5/10 SARS coronavirus negative ?5/10 respiratory virus panel negative ? ?Antimicrobials: ?Anti-infectives (From admission, onward)  ? ? None  ? ?  ?  ? ? ?Devices ?  ? ?LINES / TUBES:  ? ? ? ? ?Continuous Infusions: ? sodium chloride    ? ? ? ?Objective: ?Vitals:  ? 08/24/21 0839 08/24/21 0900 08/24/21 1336 08/24/21 1413  ?BP:  130/80  126/82  ?Pulse:  80  77  ?Resp:    20  ?Temp:  98 ?F (36.7 ?C)  97.8 ?F (36.6 ?C)  ?TempSrc:  Oral  Oral  ?SpO2: 97%  98% 97% 98%  ?Weight:      ?Height:      ? ? ?Intake/Output Summary (Last 24 hours) at 08/24/2021 1839 ?Last data filed at 08/24/2021 1734 ?Gross per 24 hour  ?Intake 603 ml  ?Output 1600 ml  ?Net -997 ml  ? ?Filed Weights  ? 08/23/21 1435 08/24/21 0500  ?Weight: 107.5 kg 107.5 kg  ? ? ?Examination: ? ?General: positive acute respiratory distress, hard of hearing, legally blind ?Eyes: negative scleral hemorrhage, negative anisocoria, negative icterus ?ENT: Negative Runny nose, negative gingival bleeding, ?Neck:  Negative scars,  masses, torticollis, lymphadenopathy, JVD ?Lungs: Clear to auscultation bilaterally, positive expiratory wheezes, negative crackles ?Cardiovascular: Regular rate and rhythm without murmur gallop or rub normal S1 and S2 ?Abdomen: MORBIDLY OBESE, negative abdominal pain, positive distended, positive soft, bowel sounds, no rebound, no ascites, no appreciable mass ?Extremities: No significant cyanosis, clubbing, or edema bilateral lower extremities ?Skin: Negative rashes, lesions, ulcers ?Psychiatric:  Negative depression, negative anxiety, negative fatigue, negative mania  ?Central nervous system:  Cranial nerves II through XII intact, tongue/uvula midline, all extremities muscle strength 5/5, sensation intact throughout,negative dysarthria, negative expressive aphasia, negative receptive aphasia. ? ?.  ? ? ? ?Data Reviewed: Care during the described time interval was provided by me .  I have reviewed this patient's available data, including medical history, events of note, physical examination, and all test results as part of my evaluation. ? ?CBC: ?No results for input(s): WBC, NEUTROABS, HGB, HCT, MCV, PLT in the last 168 hours. ?Basic Metabolic Panel: ?Recent Labs  ?Lab 08/23/21 ?1505 08/24/21 ?0837  ?NA 141 138  ?K 4.4 4.1  ?CL 103 97*  ?CO2 35* 33*  ?GLUCOSE 98 102*  ?BUN 18 24*  ?CREATININE 1.18 1.50*  ?CALCIUM 9.0 8.9  ?MG  --  2.2  ?PHOS  --  3.9  ? ?GFR: ?Estimated Creatinine Clearance:  48.9 mL/min (A) (by C-G formula based on SCr of 1.5 mg/dL (H)). ?Liver Function Tests: ?Recent Labs  ?Lab 08/24/21 ?0837  ?AST 15  ?ALT 16  ?ALKPHOS 53  ?BILITOT 1.0  ?PROT 6.0*  ?ALBUMIN 3.4*  ? ?No resul

## 2021-08-24 NOTE — Progress Notes (Signed)
Heart Failure Nurse Navigator Progress Note  ? ?Per Dr. Joseph Art, would like for patient to have a Hospital follow up with the HF TOC , unable to reach patient at [facility] , called and spoke to care Giver Haskel Schroeder) and scheduled patient for 09/04/2021 @ 9 am . Have included appointment information in discharge summary.  ? ?Rhae Hammock, BSN, RN ?Heart Failure Nurse Navigator ?Secure Chat Only  ?

## 2021-08-24 NOTE — Progress Notes (Signed)
SATURATION QUALIFICATIONS: (This note is used to comply with regulatory documentation for home oxygen) ? ?Patient Saturations on 4L Oxygen at Rest = 99% ? ?Patient Saturations on 4L Oxygen while Ambulating = 96-98% ? ?Patient Saturations on 1 Liters of oxygen while Ambulating = 91-94% ? ?Please briefly explain why patient needs home oxygen: ?-Patient tolerated walking 360 feet started off on 4L and weaned down to 1L, sats remained above 91% the entire time. Patient took about two rest breaks w/ HR remaining in the 80's the whole time. Attempted to walk on room air pt. stated he is on 2.5L Oxygen at home and did not think we should try room air.  ?

## 2021-08-25 ENCOUNTER — Inpatient Hospital Stay (HOSPITAL_COMMUNITY): Payer: Medicare PPO

## 2021-08-25 DIAGNOSIS — M79604 Pain in right leg: Secondary | ICD-10-CM

## 2021-08-25 DIAGNOSIS — I5033 Acute on chronic diastolic (congestive) heart failure: Secondary | ICD-10-CM | POA: Diagnosis not present

## 2021-08-25 DIAGNOSIS — M79605 Pain in left leg: Secondary | ICD-10-CM

## 2021-08-25 DIAGNOSIS — J9601 Acute respiratory failure with hypoxia: Secondary | ICD-10-CM | POA: Diagnosis not present

## 2021-08-25 DIAGNOSIS — J9621 Acute and chronic respiratory failure with hypoxia: Secondary | ICD-10-CM | POA: Diagnosis not present

## 2021-08-25 DIAGNOSIS — N400 Enlarged prostate without lower urinary tract symptoms: Secondary | ICD-10-CM | POA: Diagnosis not present

## 2021-08-25 LAB — COMPREHENSIVE METABOLIC PANEL
ALT: 13 U/L (ref 0–44)
AST: 14 U/L — ABNORMAL LOW (ref 15–41)
Albumin: 3.2 g/dL — ABNORMAL LOW (ref 3.5–5.0)
Alkaline Phosphatase: 52 U/L (ref 38–126)
Anion gap: 7 (ref 5–15)
BUN: 37 mg/dL — ABNORMAL HIGH (ref 8–23)
CO2: 30 mmol/L (ref 22–32)
Calcium: 8.5 mg/dL — ABNORMAL LOW (ref 8.9–10.3)
Chloride: 98 mmol/L (ref 98–111)
Creatinine, Ser: 1.55 mg/dL — ABNORMAL HIGH (ref 0.61–1.24)
GFR, Estimated: 45 mL/min — ABNORMAL LOW (ref >60)
Glucose, Bld: 125 mg/dL — ABNORMAL HIGH (ref 70–99)
Potassium: 3.8 mmol/L (ref 3.5–5.1)
Sodium: 135 mmol/L (ref 135–145)
Total Bilirubin: 1 mg/dL (ref 0.3–1.2)
Total Protein: 5.6 g/dL — ABNORMAL LOW (ref 6.5–8.1)

## 2021-08-25 LAB — CBC WITH DIFFERENTIAL/PLATELET
Abs Immature Granulocytes: 0.04 10*3/uL (ref 0.00–0.07)
Basophils Absolute: 0.1 10*3/uL (ref 0.0–0.1)
Basophils Relative: 1 %
Eosinophils Absolute: 0.2 10*3/uL (ref 0.0–0.5)
Eosinophils Relative: 2 %
HCT: 48 % (ref 39.0–52.0)
Hemoglobin: 15.1 g/dL (ref 13.0–17.0)
Immature Granulocytes: 0 %
Lymphocytes Relative: 12 %
Lymphs Abs: 1.2 10*3/uL (ref 0.7–4.0)
MCH: 29.3 pg (ref 26.0–34.0)
MCHC: 31.5 g/dL (ref 30.0–36.0)
MCV: 93 fL (ref 80.0–100.0)
Monocytes Absolute: 1.2 10*3/uL — ABNORMAL HIGH (ref 0.1–1.0)
Monocytes Relative: 12 %
Neutro Abs: 7.3 10*3/uL (ref 1.7–7.7)
Neutrophils Relative %: 73 %
Platelets: 151 10*3/uL (ref 150–400)
RBC: 5.16 MIL/uL (ref 4.22–5.81)
RDW: 14.8 % (ref 11.5–15.5)
WBC: 10 10*3/uL (ref 4.0–10.5)
nRBC: 0 % (ref 0.0–0.2)

## 2021-08-25 LAB — MAGNESIUM: Magnesium: 2.2 mg/dL (ref 1.7–2.4)

## 2021-08-25 LAB — PHOSPHORUS: Phosphorus: 4 mg/dL (ref 2.5–4.6)

## 2021-08-25 MED ORDER — TIZANIDINE HCL 4 MG PO TABS
2.0000 mg | ORAL_TABLET | Freq: Four times a day (QID) | ORAL | Status: DC | PRN
Start: 2021-08-25 — End: 2021-08-26
  Administered 2021-08-26: 2 mg via ORAL
  Filled 2021-08-25: qty 1

## 2021-08-25 MED ORDER — IPRATROPIUM-ALBUTEROL 0.5-2.5 (3) MG/3ML IN SOLN
3.0000 mL | Freq: Three times a day (TID) | RESPIRATORY_TRACT | Status: DC
  Administered 2021-08-26 (×2): 3 mL via RESPIRATORY_TRACT
  Filled 2021-08-25 (×2): qty 3

## 2021-08-25 MED ORDER — TIZANIDINE HCL 4 MG PO TABS
4.0000 mg | ORAL_TABLET | Freq: Four times a day (QID) | ORAL | Status: DC | PRN
  Administered 2021-08-25: 2 mg via ORAL
  Administered 2021-08-25: 4 mg via ORAL
  Filled 2021-08-25 (×2): qty 1

## 2021-08-25 MED ORDER — TRAZODONE HCL 50 MG PO TABS
25.0000 mg | ORAL_TABLET | Freq: Every day | ORAL | Status: DC
Start: 2021-08-25 — End: 2021-08-26
  Administered 2021-08-25: 25 mg via ORAL
  Filled 2021-08-25: qty 1

## 2021-08-25 NOTE — TOC Progression Note (Signed)
Transition of Care (TOC) - Progression Note  ? ? ?Patient Details  ?Name: PUNEET DORNBUSCH ?MRN: [id number] ?Date of Birth: Feb 03, 1941 ? ?Transition of Care (TOC) CM/SW Contact  ?Leeroy Cha, RN ?Phone Number: ?08/25/2021, 3:02 PM ? ?Clinical Narrative:    ?spoke with the patient mainly needs some help at home with daily routines and care.  Can get him and rn to check his respiratory stats, pt and ot to work with getting him up and moving, and an aide.  just needs orders.  He has insurance so medication assistance he is not eligible for. Note sent to Dr, Sherral Hammers states he wants to change o2 providers will get local providers for him.  Presently is with [facility]. ? ? ?Expected Discharge Plan: [place] ?Barriers to Discharge: No Barriers Identified ? ?Expected Discharge Plan and Services ?Expected Discharge Plan: [place] ?  ?Discharge Planning Services: CM Consult ?Post Acute Care Choice: Home Health ?Living arrangements for the past 2 months: Apartment ?                ?  ?  ?  ?  ?  ?HH Arranged: Disease Management ?[place] Agency: [facility] ?Date HH Agency Contacted: 08/24/21 ?[place] [facility] [place]: [phone] ?Representative spoke with at [place]: arron ? ? ?Social Determinants of Health (SDOH) Interventions ?Transportation Interventions: Intervention Not Indicated ? ?Readmission Risk Interventions ?   ? View : No data to display.  ?  ?  ?  ? ? ?

## 2021-08-25 NOTE — Progress Notes (Signed)
SATURATION QUALIFICATIONS: (This note is used to comply with regulatory documentation for home oxygen) ? ?Patient Saturations on Room Air at Rest = 88% ? ?Patient Saturations on Room Air while Ambulating = 84% ? ?Patient Saturations on 2 Liters of oxygen while Ambulating = 94% ? ?Please briefly explain why patient needs home oxygen: Patient requires 2L/min to maintain saturations at adequate level at rest and during mobility. He will require supplemental home O2. ? ?Verner Mould, DPT ?Acute Rehabilitation Services ?Office [phone] ?Pager [phone] ? ?

## 2021-08-25 NOTE — Discharge Instructions (Signed)
Sodium Free Flavoring Tips ? ?When cooking, the following items may be used for flavoring instead of salt or seasonings that contain sodium. ?Remember: A little bit of spice goes a long way! Be careful not to overseason. ? ?Spice Blend Recipe (makes about ? cup) ?5 teaspoons onion powder  ?2? teaspoons garlic powder  ?2? teaspoons paprika  ?2? teaspoon dry mustard  ?1? teaspoon crushed thyme leaves  ?? teaspoon white pepper  ?? teaspoon celery seed ? ?Food Item Flavorings  ?Beef Basil, bay leaf, caraway, curry, dill, dry mustard, garlic, grape jelly, green pepper, mace, marjoram, mushrooms (fresh), nutmeg, onion or onion powder, parsley, pepper, rosemary, sage  ?Chicken Basil, cloves, cranberries, mace, mushrooms (fresh), nutmeg, oregano, paprika, parsley, pineapple, saffron, sage, savory, tarragon, thyme, tomato, turmeric  ?Egg Chervil, curry, dill, dry mustard, garlic or garlic powder, green pepper, jelly, mushrooms (fresh), nutmeg, onion powder, paprika, parsley, rosemary, tarragon, tomato  ?Fish Basil, bay leaf, chervil, curry, dill, dry mustard, green pepper, lemon juice, marjoram, mushrooms (fresh), paprika, pepper, tarragon, tomato, turmeric  ?Lamb Cloves, curry, dill, garlic or garlic powder, mace, mint, mint jelly, onion, oregano, parsley, pineapple, rosemary, tarragon, thyme  ?Pork Applesauce, basil, caraway, chives, cloves, garlic or garlic powder, onion or onion powder, rosemary, thyme  ?Veal Apricots, basil, bay leaf, currant jelly, curry, ginger, marjoram, mushrooms (fresh), oregano, paprika  ?Vegetables Basil, dill, garlic or garlic powder, ginger, lemon juice, mace, marjoram, nutmeg, onion or onion powder, tarragon, tomato, sugar or sugar substitute, salt-free salad dressing, vinegar  ?Desserts Allspice, anise, cinnamon, cloves, ginger, mace, nutmeg, vanilla extract, other extracts  ? ?Copyright 2020 ? Academy of [employer] and Dietetics. All rights reserved ? ?

## 2021-08-25 NOTE — Evaluation (Signed)
Physical Therapy Evaluation ?Patient Details ?Name: Mitchell [name] ?MRN: [id number] ?DOB: 1941/03/25 ?Today's Date: 08/25/2021 ? ?History of Present Illness ? Mitchell [name] is a 81 y.o. presents to the ED for evaluation of worsening shortness of breath. Symptoms been gradually worsening over the past 2 to 3 weeks. Patient describes a generalized chest tightness from time to time but no exertional or pleuritic pain. PMH significant for COPD on 2-3 L O2 home oxygen, CAD, chronic diastolic CHF (last echo 123XX123), Hx NSTEMI. ?  ?Clinical Impression ? Mitchell [name] is 81 y.o. male admitted with above HPI and diagnosis. Patient is currently limited by functional impairments below (see PT problem list). Patient lives alone and is independent at baseline. Patient requires min assist for bed mobility, transfers, and gait. He desats on RA to 84% and recovered to 94% on 2L/min with gait. Patient will benefit from continued skilled PT interventions to address impairments and progress independence with mobility, recommending HHPT with intermittent assist from family/friends. Acute PT will follow and progress as able.  ?   ? ?Recommendations for follow up therapy are one component of a multi-disciplinary discharge planning process, led by the attending physician.  Recommendations may be updated based on patient status, additional functional criteria and insurance authorization. ? ?Follow Up Recommendations Home health PT ? ?  ?Assistance Recommended at Discharge Intermittent Supervision/Assistance  ?Patient can return home with the following ? A little help with walking and/or transfers;Assistance with cooking/housework;Assist for transportation;Help with stairs or ramp for entrance;A little help with bathing/dressing/bathroom ? ?  ?Equipment Recommendations    ?Recommendations for Other Services ?    ?  ?Functional Status Assessment Patient has had a recent decline in their functional status and demonstrates the ability to  make significant improvements in function in a reasonable and predictable amount of time.  ? ?  ?Precautions / Restrictions Precautions ?Precautions: Fall ?Restrictions ?Weight Bearing Restrictions: No  ? ?  ? ?Mobility ? Bed Mobility ?Overal bed mobility: Needs Assistance ?Bed Mobility: Supine to Sit ?  ?  ?Supine to sit: Min assist, HOB elevated ?  ?  ?General bed mobility comments: cues to pivot hips and reach for bed rail, pt reaching for therapist instead. Min assist to fully sit upright. ?  ? ?Transfers ?Overall transfer level: Needs assistance ?  ?Transfers: Sit to/from Stand ?Sit to Stand: Min assist ?  ?  ?  ?  ?  ?General transfer comment: pt using momentum and light assist to power up from EOB. pt steady in standing with walker, able to control return to sit without assist. ?  ? ?Ambulation/Gait ?Ambulation/Gait assistance: Min guard ?Gait Distance (Feet): 180 Feet ?Assistive device: Rolling walker (2 wheels) ?Gait Pattern/deviations: Step-through pattern, Decreased stride length, Shuffle, Trunk flexed, Wide base of support ?Gait velocity: decr ?  ?  ?General Gait Details: pt leaning on RW and cues needed to keep walker ahead of self. pt took 4 standing rest breaks during gait. 2L required to maintain appropriate O2 sats. ? ?Stairs ?  ?  ?  ?  ?  ? ?Wheelchair Mobility ?  ? ?Modified Rankin (Stroke Patients Only) ?  ? ?  ? ?Balance Overall balance assessment: Mild deficits observed, not formally tested, Needs assistance ?Sitting-balance support: Feet supported ?Sitting balance-Leahy Scale: Good ?  ?  ?Standing balance support: During functional activity, Bilateral upper extremity supported, Reliant on assistive device for balance ?Standing balance-Leahy Scale: Fair ?  ?  ?  ?  ?  ?  ?  ?  ?  ?  ?  ?  ?   ? ? ? ?  Pertinent Vitals/Pain Pain Assessment ?Pain Assessment: No/denies pain  ? ? ?Home Living Family/patient expects to be discharged to:: Private residence ?Living Arrangements: Other  (Comment) ?Available Help at Discharge: Family;Available PRN/intermittently ?Type of Home: Apartment ?Home Access: Level entry ?  ?  ?  ?Home Layout: One level ?Home Equipment: Kasandra Knudsen - single point;Shower seat;Grab bars - tub/shower;Rolling Walker (2 wheels) (tall walker) ?Additional Comments: Has some difficulty with managing meals - used to have meals on wheels but they had a falling out. needs a a new microwave. ..  ?  ?Prior Function Prior Level of Function : Independent/Modified Independent ?  ?  ?  ?  ?  ?  ?Mobility Comments: ambulates in his apartment , has 29' of O2 line, gets tangled he says ?ADLs Comments: ex wife takes him to grocery store, etc. Son lives out of town ?  ? ? ?Hand Dominance  ? Dominant Hand: Right ? ?  ?Extremity/Trunk Assessment  ? Upper Extremity Assessment ?Upper Extremity Assessment: Overall WFL for tasks assessed ?  ? ?Lower Extremity Assessment ?Lower Extremity Assessment: Overall WFL for tasks assessed ?  ? ?Cervical / Trunk Assessment ?Cervical / Trunk Assessment: Other exceptions ?Cervical / Trunk Exceptions: habitus  ?Communication  ? Communication: No difficulties;HOH  ?Cognition Arousal/Alertness: Awake/alert ?Behavior During Therapy: [facility] for tasks assessed/performed ?Overall Cognitive Status: Within Functional Limits for tasks assessed ?  ?  ?  ?  ?  ?  ?  ?  ?  ?  ?  ?  ?  ?  ?  ?  ?  ?  ?  ? ?  ?General Comments General comments (skin integrity, edema, etc.): pt given stress ball to continue with grip strength ? ?  ?Exercises    ? ?Assessment/Plan  ?  ?PT Assessment Patient needs continued PT services  ?PT Problem List Decreased strength;Decreased activity tolerance;Decreased balance;Decreased mobility;Decreased knowledge of use of DME;Decreased safety awareness;Decreased knowledge of precautions;Obesity;Cardiopulmonary status limiting activity ? ?   ?  ?PT Treatment Interventions DME instruction;Gait training;Stair training;Therapeutic activities;Therapeutic  exercise;Functional mobility training;Balance training;Patient/family education;Neuromuscular re-education   ? ?PT Goals (Current goals can be found in the Care Plan section)  ?Acute Rehab PT Goals ?PT Goal Formulation: With patient ?Time For Goal Achievement: 09/08/21 ?Potential to Achieve Goals: Good ? ?  ?Frequency Min 3X/week ?  ? ? ?Co-evaluation   ?  ?  ?  ?  ? ? ?  ?AM-PAC PT "6 Clicks" Mobility  ?Outcome Measure Help needed turning from your back to your side while in a flat bed without using bedrails?: A Little ?Help needed moving from lying on your back to sitting on the side of a flat bed without using bedrails?: A Little ?Help needed moving to and from a bed to a chair (including a wheelchair)?: A Little ?Help needed standing up from a chair using your arms (e.g., wheelchair or bedside chair)?: A Little ?Help needed to walk in hospital room?: A Little ?Help needed climbing 3-5 steps with a railing? : A Little ?6 Click Score: 18 ? ?  ?End of Session Equipment Utilized During Treatment: Gait belt;Oxygen ?Activity Tolerance: Patient tolerated treatment well ?Patient left: in chair;with call bell/phone within reach ?Nurse Communication: Mobility status ?PT Visit Diagnosis: Muscle weakness (generalized) (M62.81);Difficulty in walking, not elsewhere classified (R26.2);Unsteadiness on feet (R26.81) ?  ? ?Time: [id number] ?PT Time Calculation (min) (ACUTE ONLY): 34 min ? ? ?Charges:   PT Evaluation ?$PT Eval Low Complexity: 1 Low ?PT Treatments ?$Gait Training: 8-22 mins ?  ?   ? ? ?  Gwynneth Albright PT, DPT ?Acute Rehabilitation Services ?Office [phone] ?Pager [phone]  ? ?Jacques Navy ?08/25/2021, 4:38 PM ? ?

## 2021-08-25 NOTE — Progress Notes (Signed)
SATURATION QUALIFICATIONS: (This note is used to comply with regulatory documentation for home oxygen) ? ?Patient Saturations on Room Air at Rest = 94% ? ?Patient Saturations on Room Air while Ambulating = 89-91% ? ?Patient Saturations on 2 Liters of oxygen while Ambulating = 94-96% ? ?Please briefly explain why patient needs home oxygen: ?Patient's O2 sats fluctuated between 89-91% while ambulating on room air. Once patient ambulated about 180 ft, patient needed a rest break but O2 sats remained 89-91%, HR in the 70's, and patient denied SOB. Patient able to continuing walking on room air after rest break with no complaints. ?

## 2021-08-25 NOTE — Progress Notes (Signed)
Nutrition Education Note ? ?RD consulted for nutrition education regarding CHF. ? ?RD provided "Heart Failure Nutrition Therapy" handout from the Academy of Nutrition and Dietetics. Reviewed patient's dietary recall. Provided examples on ways to decrease sodium intake in diet. Discouraged intake of processed foods and use of salt shaker. Encouraged fresh fruits and vegetables as well as whole grain sources of carbohydrates to maximize fiber intake.  ? ?RD discussed why it is important for patient to adhere to diet recommendations, and emphasized the role of fluids, foods to avoid, and importance of weighing self daily. Teach back method used. ? ?Expect good compliance. However pt has vision problems so reading labels is difficult. His ex-wife who still helps him at home, was present for education and is willing to help. Pt is able to cook for himself. Will also add sodium free flavorings handout to discharge instructions.  ? ?Body mass index is 31.28 kg/m?Marland Kitchen Pt meets criteria for obesity based on current BMI. ? ?Current diet order is heart healthy, patient is consuming approximately 80% of meals at this time. Labs and medications reviewed. No further nutrition interventions warranted at this time. If additional nutrition issues arise, please re-consult RD.  ? ?Tilda Franco, MS, RD, LDN ?Inpatient Clinical Dietitian ?Contact information available via Amion ?  ?

## 2021-08-25 NOTE — Progress Notes (Signed)
?PROGRESS NOTE ? ? ? ?Mitchell [name]  [id number] DOB: 1940/12/14 DOA: 08/23/2021 ?PCP: Lawerance Cruel, MD  ? ? ? ?Brief Narrative:  ?Mitchell [name] is a 81 y.o. WM PMHx  COPD on 2-3 L O2 home oxygen, CAD, chronic diastolic CHF (last echo 123XX123), Hx NSTEMI,  ?  ?Presents to the emergency department for evaluation of worsening shortness of breath.  Symptoms been gradually worsening over the past 2 to 3 weeks.  No fevers or chills.  Patient describes a generalized chest tightness from time to time but no exertional or pleuritic pain.  He states he has had significant increase in his weight and that is closer not fitting properly.  He is feeling swollen all over.  He lives at home by himself and is accompanied by his ex-wife who came to pick him up for his pulmonary appointment this morning but ultimately called EMS due to his significant dyspnea with exertion.  States weighs himself daily.  States base weight 205 pounds (92.9 kg).  States lives alone ? ? ?Subjective: ?5/12 afebrile overnight, afebrile overnight.  Patient states did not sleep well overnight.  In addition still has muscle spasms generalized.  Also upset concerning his co-pays on medication.  States was started on Eliquis by cardiology after receiving 2 cardiac stents. ? ? ? ?Assessment & Plan: ?Covid vaccination; ?  ?Principal Problem: ?  Acute CHF (congestive heart failure) ([place]) ?Active Problems: ?  Hyperlipemia ?  History of depression ?  Shortness of breath ?  Coronary artery disease involving native coronary artery of native heart with angina pectoris ([place]) ?  COPD with acute exacerbation ([place]) ?  Obesity ?  CKD (chronic kidney disease), stage III ([place]) ?  Benign prostatic hyperplasia ?  Acute on chronic respiratory failure with hypoxia ([facility]) ?  Obesity (BMI 30-39.9) ?  Acute on chronic diastolic CHF (congestive heart failure) ([place]) ?  Muscle spasm ? ?Acute on chronic Diastolic CHF ?-Dr. Daneen Schick  Pt's Cardiologist ?- 5/10 BNP=40 ?-  Trend troponin ?- 5/11 Echocardiogram: Consistent with diastolic CHF see results below ?-base weight 205 pounds (92.9 kg) ?-Strict in and out -1.85 L ?- Daily weight ?Filed Weights  ? 08/24/21 0500 08/24/21 1840 08/25/21 0500  ?Weight: 107.5 kg 104.5 kg 104.6 kg  ? ?-Lasix 60 mg BID ?-Metoprolol 25 mg BID ?-NTG SL PRN ?-5/11 decrease Lasix to 40 mg BID (home dose): (Hold) second dose secondary to creatinine increase, see CKD stage III A below ?-5/11 have sent secure chat to [place] cardiology requesting a follow-up appointment at CHF clinic for patient of Dr. Daneen Schick cardiology, acute on chronic diastolic CHF.  Will be discharged in next 24 to 48 hours.   ?-5/11 nutrition consult:Patient with poor understanding of what a heart healthy diet consists of, please provide education. ?-5/11 upon discharge patient to follow-up HF TOC appointment 09/04/21 @ 9am ?-5/11 consult NCM to discuss patient's cost of medication (Eliquis).  Etc. ?  ?CAD ?- See CHF ?  ?Acute on chronic respiratory failure with hypoxia ?- Currently on BiPAP: Titrate to maintain SPO2 89-95% ?- Budesonide-Glycopyrrol-Formoterol 160 - 9 - 4 0.8 mcg/Act BID ?-DuoNeb QID ?-5/11 respiratory virus panel negative ?-Patient Saturations on Room Air at Rest = 94% ?Patient Saturations on Room Air while Ambulating = 89-91% ?Patient Saturations on 2 Liters of oxygen while Ambulating = 94-96% ?Please briefly explain why patient needs home oxygen: ?Patient's O2 sats fluctuated between 89-91% while ambulating on room air. Once patient ambulated  about 180 ft, patient needed a rest break but O2 sats remained 89-90%, HR in the 70's, and patient denied SOB. Patient able to continuing walking on room air after rest break with no complaints. ?-5/12 now at baseline. ?  ?COPD with acute exacerbation ?- See acute on chronic respiratory failure ?  ?CKD stage IIIa (baseline Cr ~1.39-1.5) ?-Follow closely with diuresis ?Lab Results  ?Component Value Date  ? CREATININE  1.55 (H) 08/25/2021  ? CREATININE 1.50 (H) 08/24/2021  ? CREATININE 1.18 08/23/2021  ? CREATININE 1.32 (H) 05/04/2021  ? CREATININE 1.26 (H) 05/02/2021  ?-Have diuresed to maximum extent.  See CHF for Lasix dosing. ? ?  ?BPH ?-Finasteride 5 mg daily ?  ?HLD ?- 5/11 LDL= 117.  Goal LDL<70 ?- 5/11 increase Pravastatin 20 mg daily.  Recheck lipid panel in 6 weeks. ?  ?Gout ?-5/11 uric acid level = 7.6; goal uric acid level<6 ?-Allopurinol 100 mg daily ?-5/11 continue current allopurinol dose.  PCP to titrate ? ?Muscle spasms vs DVT ?- 5/12 increase Zanaflex 4 mg QID PRN ?-5/12 bilateral lower extremity Doppler pending ? ?Obesity (BMI 32.14 kg/m?.) ?-Address as outpatient with PCP ? ?Insomnia ?- 5/12 trazodone 25 mg QHS ?  ?Goals of care ?- 5/11 PT/OT consult: Patient with acute on chronic diastolic CHF, COPD exacerbation, evaluate for CIR vs SNF ? ?  ? ?Mobility Assessment (last 72 hours)   ? ? Mobility Assessment   ? ? [place] Name 08/24/21 [id number] 08/23/21 2135  ?  ?  ?  ? Does patient have an order for bedrest or is patient medically unstable No - Continue assessment No - Continue assessment     ? What is the highest level of mobility based on the progressive mobility assessment? Level 3 (Stands with assist) - Balance while standing  and cannot march in place Level 3 (Stands with assist) - Balance while standing  and cannot march in place     ? Is the above level different from baseline mobility prior to current illness? Yes - Recommend PT order Yes - Recommend PT order     ? ?  ?  ? ?  ? ? ? ?@IPAL @ ? ?   ?DVT prophylaxis: Eliquis ?Code Status: Full ?Family Communication: 5/11 ex-wife present at bedside for discussion of plan of care all questions answered ?Status is: Inpatient ? ? ? ?Dispo: The patient is from: Home ?             Anticipated d/c is to: Home ?             Anticipated d/c date is: 1 day ?             Patient currently is not medically stable to d/c. ? ? ? ? ? ?Consultants:  ? ? ?Procedures/Significant  Events:  ?5/11 Echocardiogram ?Left Ventricle: LVEF=55 to 60%.  ?-Mild hypokinesis of the left ventricular, basal-mid inferior  ?wall.  ?-Left ventricular diastolic parameters are consistent with Grade I diastolic dysfunction .-Indeterminate filling pressures.  ? ? ?I have personally reviewed and interpreted all radiology studies and my findings are as above. ? ?VENTILATOR SETTINGS: ?Nasal cannula 5/12 ?Flow 2 L/min ?SPO2 99% ? ? ?Cultures ?5/10 influenza A/B negative ?5/10 SARS coronavirus negative ?5/10 respiratory virus panel negative ? ?Antimicrobials: ?Anti-infectives (From admission, onward)  ? ? None  ? ?  ?  ? ? ?Devices ?  ? ?LINES / TUBES:  ? ? ? ? ?Continuous Infusions: ? sodium chloride    ? ? ? ?  Objective: ?Vitals:  ? 08/24/21 2056 08/25/21 0500 08/25/21 0609 08/25/21 0736  ?BP: 128/76  (!) 127/95   ?Pulse: 84  (!) 58   ?Resp: 18  16   ?Temp: 98.6 ?F (37 ?C)  97.7 ?F (36.5 ?C)   ?TempSrc: Oral  Oral   ?SpO2: 95%  100% 99%  ?Weight:  104.6 kg    ?Height:      ? ? ?Intake/Output Summary (Last 24 hours) at 08/25/2021 0824 ?Last data filed at 08/24/2021 1734 ?Gross per 24 hour  ?Intake 600 ml  ?Output 850 ml  ?Net -250 ml  ? ? ?Filed Weights  ? 08/24/21 0500 08/24/21 1840 08/25/21 0500  ?Weight: 107.5 kg 104.5 kg 104.6 kg  ? ? ?Examination: ? ?General: positive acute respiratory distress, hard of hearing, legally blind ?Eyes: negative scleral hemorrhage, negative anisocoria, negative icterus ?ENT: Negative Runny nose, negative gingival bleeding, ?Neck:  Negative scars, masses, torticollis, lymphadenopathy, JVD ?Lungs: Clear to auscultation bilaterally, positive expiratory wheezes, negative crackles ?Cardiovascular: Regular rate and rhythm without murmur gallop or rub normal S1 and S2 ?Abdomen: MORBIDLY OBESE, negative abdominal pain, negative distended, positive soft, bowel sounds, no rebound, no ascites, no appreciable mass ?Extremities: bilateral pain to gastrocnemius muscle with palpation.  Cording  bilateral ?Skin: Negative rashes, lesions, ulcers ?Psychiatric:  Negative depression, negative anxiety, negative fatigue, negative mania  ?Central nervous system:  Cranial nerves II through XII intact, tongue/uvula

## 2021-08-25 NOTE — Plan of Care (Signed)

## 2021-08-25 NOTE — Progress Notes (Signed)
OT Cancellation Note ? ?Patient Details ?Name: Mitchell [name] ?MRN: [id number] ?DOB: 20-Apr-1940 ? ? ?Cancelled Treatment:    Reason Eval/Treat Not Completed: OT screened, no needs identified, will sign off. Per PT and nursing notes patient ambulating well and near his baseline. Patient has low vision and known to therapist. Should do well in his own environment.  ? ?Kelli Churn ?08/25/2021, 4:15 PM ?

## 2021-08-25 NOTE — Progress Notes (Signed)
BLE venous duplex has been completed. ? ? ?Results can be found under chart review under CV PROC. ?08/25/2021 3:23 PM ?Willman Cuny RVT, RDMS ? ?

## 2021-08-25 NOTE — Care Management Important Message (Signed)
Important Message ? ?Patient Details IM Letter given to the Patient. ?Name: Mitchell [name] ?MRN: [id number] ?Date of Birth: 1940/11/29 ? ? ?Medicare Important Message Given:  Yes ? ? ? ? ?Caren Macadam ?08/25/2021, 1:22 PM ?

## 2021-08-26 DIAGNOSIS — I5033 Acute on chronic diastolic (congestive) heart failure: Secondary | ICD-10-CM | POA: Diagnosis not present

## 2021-08-26 DIAGNOSIS — N400 Enlarged prostate without lower urinary tract symptoms: Secondary | ICD-10-CM | POA: Diagnosis not present

## 2021-08-26 DIAGNOSIS — J9621 Acute and chronic respiratory failure with hypoxia: Secondary | ICD-10-CM | POA: Diagnosis not present

## 2021-08-26 DIAGNOSIS — J9611 Chronic respiratory failure with hypoxia: Secondary | ICD-10-CM

## 2021-08-26 LAB — COMPREHENSIVE METABOLIC PANEL
ALT: 14 U/L (ref 0–44)
AST: 13 U/L — ABNORMAL LOW (ref 15–41)
Albumin: 3.2 g/dL — ABNORMAL LOW (ref 3.5–5.0)
Alkaline Phosphatase: 50 U/L (ref 38–126)
Anion gap: 8 (ref 5–15)
BUN: 34 mg/dL — ABNORMAL HIGH (ref 8–23)
CO2: 30 mmol/L (ref 22–32)
Calcium: 8.5 mg/dL — ABNORMAL LOW (ref 8.9–10.3)
Chloride: 97 mmol/L — ABNORMAL LOW (ref 98–111)
Creatinine, Ser: 1.43 mg/dL — ABNORMAL HIGH (ref 0.61–1.24)
GFR, Estimated: 49 mL/min — ABNORMAL LOW (ref >60)
Glucose, Bld: 118 mg/dL — ABNORMAL HIGH (ref 70–99)
Potassium: 3.6 mmol/L (ref 3.5–5.1)
Sodium: 135 mmol/L (ref 135–145)
Total Bilirubin: 0.7 mg/dL (ref 0.3–1.2)
Total Protein: 5.5 g/dL — ABNORMAL LOW (ref 6.5–8.1)

## 2021-08-26 LAB — CBC WITH DIFFERENTIAL/PLATELET
Abs Immature Granulocytes: 0.05 10*3/uL (ref 0.00–0.07)
Basophils Absolute: 0.1 10*3/uL (ref 0.0–0.1)
Basophils Relative: 1 %
Eosinophils Absolute: 0.4 10*3/uL (ref 0.0–0.5)
Eosinophils Relative: 4 %
HCT: 45.2 % (ref 39.0–52.0)
Hemoglobin: 14.9 g/dL (ref 13.0–17.0)
Immature Granulocytes: 1 %
Lymphocytes Relative: 13 %
Lymphs Abs: 1.2 10*3/uL (ref 0.7–4.0)
MCH: 30.2 pg (ref 26.0–34.0)
MCHC: 33 g/dL (ref 30.0–36.0)
MCV: 91.7 fL (ref 80.0–100.0)
Monocytes Absolute: 1.2 10*3/uL — ABNORMAL HIGH (ref 0.1–1.0)
Monocytes Relative: 13 %
Neutro Abs: 6.7 10*3/uL (ref 1.7–7.7)
Neutrophils Relative %: 68 %
Platelets: 145 10*3/uL — ABNORMAL LOW (ref 150–400)
RBC: 4.93 MIL/uL (ref 4.22–5.81)
RDW: 14.7 % (ref 11.5–15.5)
WBC: 9.6 10*3/uL (ref 4.0–10.5)
nRBC: 0 % (ref 0.0–0.2)

## 2021-08-26 LAB — MAGNESIUM: Magnesium: 2.2 mg/dL (ref 1.7–2.4)

## 2021-08-26 LAB — PHOSPHORUS: Phosphorus: 3.5 mg/dL (ref 2.5–4.6)

## 2021-08-26 MED ORDER — SALINE SPRAY 0.65 % NA SOLN
1.0000 | NASAL | Status: DC | PRN
  Administered 2021-08-26: 1 via NASAL
  Filled 2021-08-26: qty 44

## 2021-08-26 MED ORDER — PRAVASTATIN SODIUM 20 MG PO TABS: 20.0000 mg | ORAL_TABLET | Freq: Every evening | ORAL | 0 refills | Status: DC

## 2021-08-26 MED ORDER — TIZANIDINE HCL 2 MG PO TABS: 2.0000 mg | ORAL_TABLET | Freq: Four times a day (QID) | ORAL | 0 refills | Status: DC | PRN

## 2021-08-26 NOTE — Progress Notes (Incomplete)
?PROGRESS NOTE ? ? ? ?Mitchell [name]  [id number] DOB: 11-04-1940 DOA: 08/23/2021 ?PCP: Lawerance Cruel, MD  ? ? ? ?Brief Narrative:  ?Mitchell [name] is a 81 y.o. WM PMHx  COPD on 2-3 L O2 home oxygen, CAD, chronic diastolic CHF (last echo [id number]), Hx NSTEMI,  ?  ?Presents to the emergency department for evaluation of worsening shortness of breath.  Symptoms been gradually worsening over the past 2 to 3 weeks.  No fevers or chills.  Patient describes a generalized chest tightness from time to time but no exertional or pleuritic pain.  He states he has had significant increase in his weight and that is closer not fitting properly.  He is feeling swollen all over.  He lives at home by himself and is accompanied by his ex-wife who came to pick him up for his pulmonary appointment this morning but ultimately called EMS due to his significant dyspnea with exertion.  States weighs himself daily.  States base weight 205 pounds (92.9 kg).  States lives alone ? ? ?Subjective: ?5/13 ? ? ? afebrile overnight, afebrile overnight.  Patient states did not sleep well overnight.  In addition still has muscle spasms generalized.  Also upset concerning his co-pays on medication.  States was started on Eliquis by cardiology after receiving 2 cardiac stents. ? ? ? ?Assessment & Plan: ?Covid vaccination; ?  ?Principal Problem: ?  Acute CHF (congestive heart failure) ([place]) ?Active Problems: ?  Hyperlipemia ?  History of depression ?  Shortness of breath ?  Coronary artery disease involving native coronary artery of native heart with angina pectoris ([place]) ?  COPD with acute exacerbation ([place]) ?  Obesity ?  CKD (chronic kidney disease), stage III ([place]) ?  Benign prostatic hyperplasia ?  Acute on chronic respiratory failure with hypoxia (HCC) ?  Obesity (BMI 30-39.9) ?  Acute on chronic diastolic CHF (congestive heart failure) ([place]) ?  Muscle spasm ? ?Acute on chronic Diastolic CHF ?-Dr. Daneen Schick  Pt's Cardiologist ?- 5/10  BNP=40 ?- Trend troponin ?- 5/11 Echocardiogram: Consistent with diastolic CHF see results below ?-base weight 205 pounds (92.9 kg) ?-Strict in and out -1.85 L ?- Daily weight ?Filed Weights  ? 08/24/21 1840 08/25/21 0500 08/26/21 0500  ?Weight: 104.5 kg 104.6 kg 105.5 kg  ? ?-Lasix 60 mg BID ?-Metoprolol 25 mg BID ?-NTG SL PRN ?-5/11 decrease Lasix to 40 mg BID (home dose): (Hold) second dose secondary to creatinine increase, see CKD stage III A below ?-5/11 have sent secure chat to [place] cardiology requesting a follow-up appointment at CHF clinic for patient of Dr. Daneen Schick cardiology, acute on chronic diastolic CHF.  Will be discharged in next 24 to 48 hours.   ?-5/11 nutrition consult:Patient with poor understanding of what a heart healthy diet consists of, please provide education. ?-5/11 upon discharge patient to follow-up HF TOC appointment 09/04/21 @ 9am ?-5/11 consult NCM to discuss patient's cost of medication (Eliquis).  Etc. ?  ?CAD ?- See CHF ?  ?Acute on chronic respiratory failure with hypoxia ?- Currently on BiPAP: Titrate to maintain SPO2 89-95% ?- Budesonide-Glycopyrrol-Formoterol 160 - 9 - 4 0.8 mcg/Act BID ?-DuoNeb QID ?-5/11 respiratory virus panel negative ?SATURATION QUALIFICATIONS: (This note is used to comply with regulatory documentation for home oxygen) ?Patient Saturations on Room Air at Rest = 88% ?Patient Saturations on Room Air while Ambulating = 84% ?Patient Saturations on 2 Liters of oxygen while Ambulating = 94% ?Please briefly explain why patient  needs home oxygen: Patient requires 2L/min to maintain saturations at adequate level at rest and during mobility. He will require supplemental home O2. ?-Patient qualifies for home O2 ?- 2 L O2 via [place] titrate to maintain SPO2> 92% ?- Provide Inogen home O2 concentrator ?-5/12 now at baseline. ?  ?COPD with acute exacerbation ?- See acute on chronic respiratory failure ?  ?CKD stage IIIa (baseline Cr ~1.39-1.5) ?-Follow closely  with diuresis ?Lab Results  ?Component Value Date  ? CREATININE 1.43 (H) 08/26/2021  ? CREATININE 1.55 (H) 08/25/2021  ? CREATININE 1.50 (H) 08/24/2021  ? CREATININE 1.18 08/23/2021  ? CREATININE 1.32 (H) 05/04/2021  ? ?-Have diuresed to maximum extent.  See CHF for Lasix dosing. ? ?  ?BPH ?-Finasteride 5 mg daily ?  ?HLD ?- 5/11 LDL= 117.  Goal LDL<70 ?- 5/11 increase Pravastatin 20 mg daily.  Recheck lipid panel in 6 weeks. ?  ?Gout ?-5/11 uric acid level = 7.6; goal uric acid level<6 ?-Allopurinol 100 mg daily ?-5/11 continue current allopurinol dose.  PCP to titrate ? ?Muscle spasms vs DVT ?- 5/12 increase Zanaflex 4 mg QID PRN ?-5/12 bilateral lower extremity Doppler pending ? ?Obesity (BMI 32.14 kg/m?.) ?-Address as outpatient with PCP ? ?Insomnia ?- 5/12 trazodone 25 mg QHS ?  ?Goals of care ?- 5/11 PT/OT consult: Patient with acute on chronic diastolic CHF, COPD exacerbation, evaluate for CIR vs SNF ? ?  ? ?Mobility Assessment (last 72 hours)   ? ? Mobility Assessment   ? ? [place] Name 08/25/21 2029 08/25/21 1605 08/25/21 1300 08/24/21 0742 08/23/21 2135  ? Does patient have an order for bedrest or is patient medically unstable No - Continue assessment -- No - Continue assessment No - Continue assessment No - Continue assessment  ? What is the highest level of mobility based on the progressive mobility assessment? Level 5 (Walks with assist in room/hall) - Balance while stepping forward/back and can walk in room with assist - Complete Level 5 (Walks with assist in room/hall) - Balance while stepping forward/back and can walk in room with assist - Complete Level 4 (Walks with assist in room) - Balance while marching in place and cannot step forward and back - Complete Level 3 (Stands with assist) - Balance while standing  and cannot march in place Level 3 (Stands with assist) - Balance while standing  and cannot march in place  ? Is the above level different from baseline mobility prior to current illness? -- --  -- Yes - Recommend PT order Yes - Recommend PT order  ? ?  ?  ? ?  ? ? ? ?@IPAL @ ? ?   ?DVT prophylaxis: Eliquis ?Code Status: Full ?Family Communication: 5/11 ex-wife present at bedside for discussion of plan of care all questions answered ?Status is: Inpatient ? ? ? ?Dispo: The patient is from: Home ?             Anticipated d/c is to: Home ?             Anticipated d/c date is: 1 day ?             Patient currently is not medically stable to d/c. ? ? ? ? ? ?Consultants:  ? ? ?Procedures/Significant Events:  ?5/11 Echocardiogram ?Left Ventricle: LVEF=55 to 60%.  ?-Mild hypokinesis of the left ventricular, basal-mid inferior  ?wall.  ?-Left ventricular diastolic parameters are consistent with Grade I diastolic dysfunction .-Indeterminate filling pressures.  ? ? ?I have personally reviewed  and interpreted all radiology studies and my findings are as above. ? ?VENTILATOR SETTINGS: ?Nasal cannula 5/12 ?Flow 2 L/min ?SPO2 99% ? ? ?Cultures ?5/10 influenza A/B negative ?5/10 SARS coronavirus negative ?5/10 respiratory virus panel negative ? ?Antimicrobials: ?Anti-infectives (From admission, onward)  ? ? None  ? ?  ?  ? ? ?Devices ?  ? ?LINES / TUBES:  ? ? ? ? ?Continuous Infusions: ? sodium chloride    ? ? ? ?Objective: ?Vitals:  ? 08/25/21 2029 08/26/21 0500 08/26/21 0514 08/26/21 0737  ?BP: 113/74  113/64   ?Pulse: 68  (!) 55   ?Resp: 20  (!) 22   ?Temp: 97.7 ?F (36.5 ?C)  (!) 97.4 ?F (36.3 ?C)   ?TempSrc: Oral  Oral   ?SpO2: 98%  97% 95%  ?Weight:  105.5 kg    ?Height:      ? ? ?Intake/Output Summary (Last 24 hours) at 08/26/2021 1006 ?Last data filed at 08/26/2021 0515 ?Gross per 24 hour  ?Intake 480 ml  ?Output 1100 ml  ?Net -620 ml  ? ? ?Filed Weights  ? 08/24/21 1840 08/25/21 0500 08/26/21 0500  ?Weight: 104.5 kg 104.6 kg 105.5 kg  ? ? ?Examination: ? ?General: positive acute respiratory distress, hard of hearing, legally blind ?Eyes: negative scleral hemorrhage, negative anisocoria, negative icterus ?ENT: Negative  Runny nose, negative gingival bleeding, ?Neck:  Negative scars, masses, torticollis, lymphadenopathy, JVD ?Lungs: Clear to auscultation bilaterally, positive expiratory wheezes, negative crackles ?[facility]

## 2021-08-26 NOTE — Discharge Summary (Signed)
Physician Discharge Summary  ?Mitchell [name] [id number] DOB: [birth year] DOA: 08/23/2021 ? ?PCP: Lawerance Cruel, MD ? ?Admit date: 08/23/2021 ?Discharge date: 08/27/2021 ? ?Time spent: 35 minutes ? ?Recommendations for Outpatient Follow-up:  ? ?Acute on chronic Diastolic CHF ?-Dr. Daneen Schick  Pt's Cardiologist ?- 5/10 BNP=40 ?- Trend troponin ?- 5/11 Echocardiogram: Consistent with diastolic CHF see results below ?-base weight 205 pounds (92.9 kg) ?-Strict in and out -1.85 L ?- Daily weight ?Filed Weights  ? 08/24/21 1840 08/25/21 0500 08/26/21 0500  ?Weight: 104.5 kg 104.6 kg 105.5 kg  ?-Lasix 60 mg BID ?-Metoprolol 25 mg BID ?-NTG SL PRN ?-5/11 decrease Lasix to 40 mg BID (home dose): (Hold) second dose secondary to creatinine increase, see CKD stage III A below ?-5/11 nutrition consult:Patient with poor understanding of what a heart healthy diet consists of, please provide education. ?-5/11 upon discharge patient to follow-up HF TOC appointment 09/04/21 @ 9am ?-5/11 consult NCM to discuss patient's cost of medication (Eliquis).  Etc. ?  ?CAD ?- See CHF ?  ?Acute on chronic respiratory failure with hypoxia ?- Currently on BiPAP: Titrate to maintain SPO2 89-95% ?- Budesonide-Glycopyrrol-Formoterol 160 - 9 - 4 0.8 mcg/Act BID ?-DuoNeb QID ?-5/11 respiratory virus panel negative ?SATURATION QUALIFICATIONS: (This note is used to comply with regulatory documentation for home oxygen) ?Patient Saturations on Room Air at Rest = 88% ?Patient Saturations on Room Air while Ambulating = 84% ?Patient Saturations on 2 Liters of oxygen while Ambulating = 94% ?Please briefly explain why patient needs home oxygen: Patient requires 2L/min to maintain saturations at adequate level at rest and during mobility. He will require supplemental home O2. ?-Patient qualifies for home O2 ?- 2 L O2 via [place] titrate to maintain SPO2> 92% ?- Provide Inogen home O2 concentrator ?-5/12 now at baseline. ?  ?COPD with acute exacerbation ?- See  acute on chronic respiratory failure ?  ?CKD stage IIIa (baseline Cr ~1.39-1.5) ?-Follow closely with diuresis ?Lab Results  ?Component Value Date  ? CREATININE 1.43 (H) 08/26/2021  ? CREATININE 1.55 (H) 08/25/2021  ? CREATININE 1.50 (H) 08/24/2021  ? CREATININE 1.18 08/23/2021  ? CREATININE 1.32 (H) 05/04/2021  ?-Stable ? ?BPH ?-Finasteride 5 mg daily ?  ?HLD ?- 5/11 LDL= 117.  Goal LDL<70 ?- 5/11 increase Pravastatin 20 mg daily.  Recheck lipid panel in 6 weeks. ?  ?Gout ?-5/11 uric acid level = 7.6; goal uric acid level<6 ?-Allopurinol 100 mg daily ?-5/11 continue current allopurinol dose.  PCP to titrate ?  ?Muscle spasms vs DVT ?- 5/12 increase Zanaflex 4 mg QID PRN ?-5/12 bilateral lower extremity: Negative DVT bilateral ? ?Obesity (BMI 32.14 kg/m?.) ?-Address as outpatient with PCP ?  ?Insomnia ?- 5/12 trazodone 25 mg QHS ? ? ? ? ? ?Discharge Diagnoses:  ?Principal Problem: ?  Acute CHF (congestive heart failure) ([place]) ?Active Problems: ?  Hyperlipemia ?  History of depression ?  Shortness of breath ?  Coronary artery disease involving native coronary artery of native heart with angina pectoris ([place]) ?  COPD with acute exacerbation ([place]) ?  Obesity ?  CKD (chronic kidney disease), stage III ([place]) ?  Benign prostatic hyperplasia ?  Acute on chronic respiratory failure with hypoxia (HCC) ?  Obesity (BMI 30-39.9) ?  Acute on chronic diastolic CHF (congestive heart failure) ([place]) ?  Muscle spasm ? ? ?Discharge Condition: Stable ? ?Diet recommendation: Heart healthy ? ?Filed Weights  ? 08/24/21 1840 08/25/21 0500 08/26/21 0500  ?Weight: 104.5 kg 104.6 kg 105.5  kg  ? ? ?History of present illness:  ?Mitchell [name] is a 81 y.o. WM PMHx  COPD on 2-3 L O2 home oxygen, CAD, chronic diastolic CHF (last echo 12/2016), Hx NSTEMI,  ?  ?Presents to the emergency department for evaluation of worsening shortness of breath.  Symptoms been gradually worsening over the past 2 to 3 weeks.  No fevers or chills.  Patient describes  a generalized chest tightness from time to time but no exertional or pleuritic pain.  He states he has had significant increase in his weight and that is closer not fitting properly.  He is feeling swollen all over.  He lives at home by himself and is accompanied by his ex-wife who came to pick him up for his pulmonary appointment this morning but ultimately called EMS due to his significant dyspnea with exertion.  States weighs himself daily.  States base weight 205 pounds (92.9 kg).  States lives alone ? ?Hospital Course:  ?See above ? ? ?Procedures: ?5/11 Echocardiogram ?Left Ventricle: LVEF=55 to 60%.  ?-Mild hypokinesis of the left ventricular, basal-mid inferior  ?wall.  ?-Left ventricular diastolic parameters are consistent with Grade I diastolic dysfunction .-Indeterminate filling pressures.  ?5/12 vascular ultrasound bilateral lower extremity: Negative DVT ? ? ? ?Cultures  ?5/10 influenza A/B negative ?5/10 SARS coronavirus negative ?5/10 respiratory virus panel negative ? ? ? ? ?Discharge Exam: ?Vitals:  ? 08/26/21 0514 08/26/21 0737 08/26/21 1035 08/26/21 1152  ?BP: 113/64  130/60 137/75  ?Pulse: (!) 55  66 68  ?Resp: (!) 22   19  ?Temp: (!) 97.4 ?F (36.3 ?C)   (!) 97.3 ?F (36.3 ?C)  ?TempSrc: Oral   Oral  ?SpO2: 97% 95% 96% 95%  ?Weight:      ?Height:      ? ? ?General: positive chronic respiratory distress, hard of hearing, legally blind ?Eyes: negative scleral hemorrhage, negative anisocoria, negative icterus ?ENT: Negative Runny nose, negative gingival bleeding, ?Neck:  Negative scars, masses, torticollis, lymphadenopathy, JVD ?Lungs: Clear to auscultation bilaterally, positive expiratory wheezes, negative crackles ?Cardiovascular: Regular rate and rhythm without murmur gallop or rub normal S1 and S2 ? ?Discharge Instructions ? ?Discharge Instructions   ? ? Amb Referral to HF Clinic   Complete by: As directed ?  ? ?  ? ?Allergies as of 08/26/2021   ? ?   Reactions  ? Lipitor [atorvastatin] Other (See  Comments)  ? "just paralyzed me; I can't take it"  ? Hydrocodone-acetaminophen Hives  ? Rosuvastatin   ? Leg aches  ? ?  ? ?  ?Medication List  ?  ? ?STOP taking these medications   ? ?furosemide 40 MG tablet ?Commonly known as: LASIX ?  ?potassium chloride 10 MEQ tablet ?Commonly known as: [place]-Con 10 ?  ? ?  ? ?TAKE these medications   ? ?acetaminophen 500 MG tablet ?Commonly known as: TYLENOL ?Take 500-1,000 mg by mouth daily as needed for headache (for arthritis pain). ?  ?albuterol (2.5 MG/3ML) 0.083% nebulizer solution ?Commonly known as: PROVENTIL ?Take 3 mLs (2.5 mg total) by nebulization every 6 (six) hours as needed for wheezing or shortness of breath. ?  ?albuterol 108 (90 Base) MCG/ACT inhaler ?Commonly known as: VENTOLIN HFA ?Inhale 2 puffs into the lungs every 6 (six) hours as needed for wheezing. Reported on 05/04/2015 ?  ?allopurinol 100 MG tablet ?Commonly known as: ZYLOPRIM ?Take 100 mg by mouth daily. ?  ?Breztri Aerosphere 160-9-4.8 MCG/ACT Aero ?Generic drug: Budeson-Glycopyrrol-Formoterol ?Inhale 2 puffs into the  lungs in the morning and at bedtime. ?  ?diclofenac 1.3 % Ptch ?Commonly known as: FLECTOR ?Place 1 patch onto the skin 2 (two) times daily. If not affordable, pls substitute OTC Voltaren Gel or cream--thanks ?  ?Eliquis 5 MG Tabs tablet ?Generic drug: apixaban ?TAKE 1 TABLET (5 MG TOTAL) BY MOUTH 2 (TWO) TIMES DAILY. NEED ANNUAL MD APPT FOR REFILLS ?What changed: See the new instructions. ?  ?finasteride 5 MG tablet ?Commonly known as: PROSCAR ?Take 5 mg by mouth daily. Reported on 05/04/2015 ?  ?guaiFENesin 600 MG 12 hr tablet ?Commonly known as: [place] ?Take 1 tablet (600 mg total) by mouth 2 (two) times daily. ?What changed: how much to take ?  ?metoprolol tartrate 25 MG tablet ?Commonly known as: LOPRESSOR ?TAKE 1 TABLET TWICE DAILY ?  ?montelukast 10 MG tablet ?Commonly known as: SINGULAIR ?Take 1 tablet (10 mg total) by mouth at bedtime. ?  ?nitroGLYCERIN 0.4 MG SL  tablet ?Commonly known as: NITROSTAT ?Place 1 tablet (0.4 mg total) under the tongue every 5 (five) minutes as needed. Reported on 05/04/2015 ?  ?OXYGEN ?Inhale 3 L into the lungs continuous as needed (for diffiulty brea

## 2021-08-26 NOTE — TOC Transition Note (Addendum)
Transition of Care (TOC) - CM/SW Discharge Note ? ? ?Patient Details  ?Name: Mitchell [name] ?MRN: [id number] ?Date of Birth: [birth year] ? ?Transition of Care (TOC) CM/SW Contact:  ?Darleene Cleaver, LCSW ?Phone Number: ?08/26/2021, 1:14 PM ? ? ?Clinical Narrative:    ? ?CSW was informed that patient is ready for discharge today.  Patient has been set up with [facility] for home health.  Patient currently active with [facility] for home oxygen.  Per bedside nurse, patient will need a portable tank to go home with today.  CSW spoke to Ross at [facility], they will deliver one to his room before he leaves.  CSW signing off please reconsult if social work needs arise. ? ? ?Final next level of care: Home w Home Health Services ?Barriers to Discharge: Barriers Resolved ? ? ?Patient Goals and CMS Choice ?Patient states their goals for this hospitalization and ongoing recovery are:: To return back home with home health. ?CMS Medicare.gov Compare Post Acute Care list provided to:: Patient ?Choice offered to / list presented to : Patient ? ?Discharge Placement ?  ?           ?  ?  ?  ?  ? ?Discharge Plan and Services ?  ?Discharge Planning Services: CM Consult ?Post Acute Care Choice: Home Health          ?  ?  ?  ?  ?  ?HH Arranged: PT, RN, Nurse's Aide, OT ?HH Agency: [facility] ?Date HH Agency Contacted: 08/25/21 ?Time HH Agency Contacted: 1314 ?Representative spoke with at [facility] Agency: Clifton Custard ? ?Social Determinants of Health (SDOH) Interventions ?Transportation Interventions: Intervention Not Indicated ? ? ?Readmission Risk Interventions ?   ? View : No data to display.  ?  ?  ?  ? ? ? ? ? ?

## 2021-08-28 ENCOUNTER — Other Ambulatory Visit: Payer: Self-pay

## 2021-08-28 NOTE — Patient Outreach (Signed)
[facility] Customer service manager [facility]) Care Management ? ?08/28/2021 ? ?Mitchell [name] ?[birth year] ?[id number] ? ? ?Telephone call to patient for follow up by patient through [facility]. Patient said he thought that the call was home health and that is why he asked for a call back.  Patient states he does not need anything else and declined to engage.  He put his caregiver ex wife [name] on the phone and she stated the same as patient states above and states home health is coming tomorrow. She also declined any further call.   ? ?Plan: RN CM will close case. ? ?Bary Leriche, RN, MSN ?[facility] Care Management ?Care Management Coordinator ?Direct Line [phone] ?Toll Free: [phone]  ?Fax: [phone] ? ?

## 2021-08-28 NOTE — Patient Outreach (Signed)
Received a call back Emmi call from Mr. Simonis. ?I have assigned Fleeta Emmer, RN to call for follow up and determine if there are any Case Management needs.  ?  ?Iverson Alamin, CBCS, CMAA ?[facility] Care Management Assistant ?[facility] Care Management ?[phone]   ?

## 2021-08-29 DIAGNOSIS — I5033 Acute on chronic diastolic (congestive) heart failure: Secondary | ICD-10-CM | POA: Diagnosis not present

## 2021-08-29 DIAGNOSIS — J9621 Acute and chronic respiratory failure with hypoxia: Secondary | ICD-10-CM | POA: Diagnosis not present

## 2021-08-29 DIAGNOSIS — N1831 Chronic kidney disease, stage 3a: Secondary | ICD-10-CM | POA: Diagnosis not present

## 2021-08-29 DIAGNOSIS — I4891 Unspecified atrial fibrillation: Secondary | ICD-10-CM | POA: Diagnosis not present

## 2021-08-29 DIAGNOSIS — N400 Enlarged prostate without lower urinary tract symptoms: Secondary | ICD-10-CM | POA: Diagnosis not present

## 2021-08-29 DIAGNOSIS — I2510 Atherosclerotic heart disease of native coronary artery without angina pectoris: Secondary | ICD-10-CM | POA: Diagnosis not present

## 2021-08-29 DIAGNOSIS — I130 Hypertensive heart and chronic kidney disease with heart failure and stage 1 through stage 4 chronic kidney disease, or unspecified chronic kidney disease: Secondary | ICD-10-CM | POA: Diagnosis not present

## 2021-08-29 DIAGNOSIS — J440 Chronic obstructive pulmonary disease with acute lower respiratory infection: Secondary | ICD-10-CM | POA: Diagnosis not present

## 2021-08-29 DIAGNOSIS — M109 Gout, unspecified: Secondary | ICD-10-CM | POA: Diagnosis not present

## 2021-09-04 ENCOUNTER — Encounter (HOSPITAL_COMMUNITY): Payer: Medicare PPO

## 2021-09-07 ENCOUNTER — Emergency Department (HOSPITAL_BASED_OUTPATIENT_CLINIC_OR_DEPARTMENT_OTHER): Payer: Medicare PPO

## 2021-09-07 ENCOUNTER — Encounter (HOSPITAL_BASED_OUTPATIENT_CLINIC_OR_DEPARTMENT_OTHER): Payer: Self-pay

## 2021-09-07 ENCOUNTER — Emergency Department (HOSPITAL_BASED_OUTPATIENT_CLINIC_OR_DEPARTMENT_OTHER)
Admission: EM | Admit: 2021-09-07 | Discharge: 2021-09-07 | Disposition: A | Discharge status: Home / Self Care | Payer: Medicare PPO | Attending: Emergency Medicine | Admitting: Emergency Medicine

## 2021-09-07 ENCOUNTER — Other Ambulatory Visit: Payer: Self-pay

## 2021-09-07 ENCOUNTER — Other Ambulatory Visit (HOSPITAL_BASED_OUTPATIENT_CLINIC_OR_DEPARTMENT_OTHER): Payer: Medicare PPO | Admitting: Radiology

## 2021-09-07 DIAGNOSIS — I252 Old myocardial infarction: Secondary | ICD-10-CM | POA: Diagnosis not present

## 2021-09-07 DIAGNOSIS — R5381 Other malaise: Secondary | ICD-10-CM

## 2021-09-07 DIAGNOSIS — R0602 Shortness of breath: Secondary | ICD-10-CM | POA: Diagnosis not present

## 2021-09-07 DIAGNOSIS — N189 Chronic kidney disease, unspecified: Secondary | ICD-10-CM | POA: Insufficient documentation

## 2021-09-07 DIAGNOSIS — R100 Acute abdomen: Secondary | ICD-10-CM | POA: Insufficient documentation

## 2021-09-07 DIAGNOSIS — R7989 Other specified abnormal findings of blood chemistry: Secondary | ICD-10-CM | POA: Insufficient documentation

## 2021-09-07 DIAGNOSIS — J441 Chronic obstructive pulmonary disease with (acute) exacerbation: Secondary | ICD-10-CM | POA: Insufficient documentation

## 2021-09-07 DIAGNOSIS — I130 Hypertensive heart and chronic kidney disease with heart failure and stage 1 through stage 4 chronic kidney disease, or unspecified chronic kidney disease: Secondary | ICD-10-CM | POA: Diagnosis not present

## 2021-09-07 DIAGNOSIS — R109 Unspecified abdominal pain: Secondary | ICD-10-CM | POA: Diagnosis not present

## 2021-09-07 DIAGNOSIS — Z7901 Long term (current) use of anticoagulants: Secondary | ICD-10-CM | POA: Diagnosis not present

## 2021-09-07 DIAGNOSIS — I509 Heart failure, unspecified: Secondary | ICD-10-CM | POA: Insufficient documentation

## 2021-09-07 LAB — COMPREHENSIVE METABOLIC PANEL
ALT: 11 U/L (ref 0–44)
AST: 14 U/L — ABNORMAL LOW (ref 15–41)
Albumin: 4 g/dL (ref 3.5–5.0)
Alkaline Phosphatase: 70 U/L (ref 38–126)
Anion gap: 7 (ref 5–15)
BUN: 22 mg/dL (ref 8–23)
CO2: 36 mmol/L — ABNORMAL HIGH (ref 22–32)
Calcium: 9.4 mg/dL (ref 8.9–10.3)
Chloride: 99 mmol/L (ref 98–111)
Creatinine, Ser: 1.31 mg/dL — ABNORMAL HIGH (ref 0.61–1.24)
GFR, Estimated: 55 mL/min — ABNORMAL LOW (ref >60)
Glucose, Bld: 89 mg/dL (ref 70–99)
Potassium: 4 mmol/L (ref 3.5–5.1)
Sodium: 142 mmol/L (ref 135–145)
Total Bilirubin: 0.7 mg/dL (ref 0.3–1.2)
Total Protein: 6.4 g/dL — ABNORMAL LOW (ref 6.5–8.1)

## 2021-09-07 LAB — DIFFERENTIAL
Abs Immature Granulocytes: 0.02 10*3/uL (ref 0.00–0.07)
Basophils Absolute: 0.1 10*3/uL (ref 0.0–0.1)
Basophils Relative: 1 %
Eosinophils Absolute: 0.3 10*3/uL (ref 0.0–0.5)
Eosinophils Relative: 4 %
Immature Granulocytes: 0 %
Lymphocytes Relative: 14 %
Lymphs Abs: 0.9 10*3/uL (ref 0.7–4.0)
Monocytes Absolute: 0.8 10*3/uL (ref 0.1–1.0)
Monocytes Relative: 12 %
Neutro Abs: 4.5 10*3/uL (ref 1.7–7.7)
Neutrophils Relative %: 69 %

## 2021-09-07 LAB — CBC
HCT: 51 % (ref 39.0–52.0)
Hemoglobin: 16.1 g/dL (ref 13.0–17.0)
MCH: 29 pg (ref 26.0–34.0)
MCHC: 31.6 g/dL (ref 30.0–36.0)
MCV: 91.9 fL (ref 80.0–100.0)
Platelets: 192 10*3/uL (ref 150–400)
RBC: 5.55 MIL/uL (ref 4.22–5.81)
RDW: 14.4 % (ref 11.5–15.5)
WBC: 6.6 10*3/uL (ref 4.0–10.5)
nRBC: 0 % (ref 0.0–0.2)

## 2021-09-07 LAB — TROPONIN I (HIGH SENSITIVITY)
Troponin I (High Sensitivity): 4 ng/L (ref <18)
Troponin I (High Sensitivity): 3 ng/L (ref <18)

## 2021-09-07 LAB — BRAIN NATRIURETIC PEPTIDE: B Natriuretic Peptide: 27 pg/mL (ref 0.0–100.0)

## 2021-09-07 MED ORDER — IPRATROPIUM-ALBUTEROL 0.5-2.5 (3) MG/3ML IN SOLN
3.0000 mL | Freq: Once | RESPIRATORY_TRACT | Status: AC
  Administered 2021-09-07: 3 mL via RESPIRATORY_TRACT
  Filled 2021-09-07: qty 3

## 2021-09-07 MED ORDER — IOHEXOL 300 MG/ML  SOLN
100.0000 mL | Freq: Once | INTRAMUSCULAR | Status: AC | PRN
  Administered 2021-09-07: 100 mL via INTRAVENOUS

## 2021-09-07 MED ORDER — FUROSEMIDE 10 MG/ML IJ SOLN
40.0000 mg | Freq: Once | INTRAMUSCULAR | Status: AC
  Administered 2021-09-07: 40 mg via INTRAVENOUS
  Filled 2021-09-07: qty 4

## 2021-09-07 NOTE — ED Triage Notes (Signed)
Pt. States doctor told pt's stomach is filled with fluid., and causing shortness of breath. States he does take furosemide. States he was in the hospital a week ago. Pt is on 3L of 02 in triage.

## 2021-09-07 NOTE — ED Notes (Signed)
RT note: Pt. arrived on 3 lpm n/c on own tank and normally uses 2 lpm, has hx. of BiPAP use on last admission due to >'d fluid with Lasix use, pts. 02 tank placed in room #5 in our holder.

## 2021-09-07 NOTE — ED Provider Notes (Signed)
[facility]ER[facility]ENC[facility] DEPT Provider Note   CSN: [id number] Arrival date & time: 09/07/21  1130     History  Chief Complaint  Patient presents with   Shortness of Breath    Mitchell [name] is a 81 y.o. male with past medical history significant for hypertension, hyperlipidemia, history of MI, COPD, congestive heart failure, CKD, paroxysmal A-fib who presents with concern for increased abdominal distention, shortness of breath, occasional chest pain.  Patient reports that he is having worsening chest pain, shortness of breath with exertion.  Patient reports that he was recently hospitalized for ulnar, did not ever have full return to normal health before beginning to decline again.  Patient reports he does not normally have swelling of the abdomen, does endorse some pain in the abdomen secondary to the swelling.  He denies significant swelling of the lower extremities.  He denies chest pain at rest.  He denies radiation to the arm, numbness, tingling, diaphoresis.  He denies nausea, vomiting, fever, chills.  He endorses remote history of tobacco use, takes albuterol as needed as well as budesonide/formoterol twice daily.  He is chronically anticoagulated on A-fib, takes metoprolol for heart rate/rhythm, is well as Lasix but does not know how much he takes.  He reports that he has not missed any dose of Lasix.  He is chronically on 2 to 3 L nasal cannula, and is on the same at this time.   Shortness of Breath     Home Medications Prior to Admission medications   Medication Sig Start Date End Date Taking? Authorizing Provider  acetaminophen (TYLENOL) 500 MG tablet Take 500-1,000 mg by mouth daily as needed for headache (for arthritis pain).    [provider]  albuterol (PROVENTIL HFA;VENTOLIN HFA) 108 (90 BASE) MCG/ACT inhaler Inhale 2 puffs into the lungs every 6 (six) hours as needed for wheezing. Reported on 05/04/2015    [provider]  albuterol  (PROVENTIL) (2.5 MG/3ML) 0.083% nebulizer solution Take 3 mLs (2.5 mg total) by nebulization every 6 (six) hours as needed for wheezing or shortness of breath. 05/19/21   Martyn Ehrich, NP  allopurinol (ZYLOPRIM) 100 MG tablet Take 100 mg by mouth daily. 11/07/20   [provider]  Budeson-Glycopyrrol-Formoterol (BREZTRI AEROSPHERE) 160-9-4.8 MCG/ACT AERO Inhale 2 puffs into the lungs in the morning and at bedtime. 08/04/21   Freddi Starr, MD  diclofenac (FLECTOR) 1.3 % PTCH Place 1 patch onto the skin 2 (two) times daily. If not affordable, pls substitute OTC Voltaren Gel or cream--thanks Patient not taking: Reported on 08/23/2021 05/05/21   Nita Sells, MD  ELIQUIS 5 MG TABS tablet TAKE 1 TABLET (5 MG TOTAL) BY MOUTH 2 (TWO) TIMES DAILY. NEED ANNUAL MD APPT FOR REFILLS Patient taking differently: Take 5 mg by mouth 2 (two) times daily. 04/13/21   Belva Crome, MD  finasteride (PROSCAR) 5 MG tablet Take 5 mg by mouth daily. Reported on 05/04/2015    [provider]  guaiFENesin (MUCINEX) 600 MG 12 hr tablet Take 1 tablet (600 mg total) by mouth 2 (two) times daily. Patient taking differently: Take 300 mg by mouth 2 (two) times daily. 05/19/21   Martyn Ehrich, NP  metoprolol tartrate (LOPRESSOR) 25 MG tablet TAKE 1 TABLET TWICE DAILY Patient taking differently: Take 25 mg by mouth 2 (two) times daily. 03/22/20   Belva Crome, MD  montelukast (SINGULAIR) 10 MG tablet Take 1 tablet (10 mg total) by mouth at bedtime. 08/04/21  Martina Sinner, MD  nitroGLYCERIN (NITROSTAT) 0.4 MG SL tablet Place 1 tablet (0.4 mg total) under the tongue every 5 (five) minutes as needed. Reported on 05/04/2015 Patient not taking: Reported on 08/23/2021 08/20/17   Leone Brand, NP  OXYGEN Inhale 3 L into the lungs continuous as needed (for diffiulty breathing).    [provider]  pravastatin (PRAVACHOL) 20 MG tablet Take 1 tablet (20 mg total) by mouth every evening. 08/26/21    Drema Dallas, MD  tiZANidine (ZANAFLEX) 2 MG tablet Take 1 tablet (2 mg total) by mouth every 6 (six) hours as needed for muscle spasms. 08/26/21   Drema Dallas, MD      Allergies    Lipitor [atorvastatin], Hydrocodone-acetaminophen, and Rosuvastatin    Review of Systems   Review of Systems  Respiratory:  Positive for shortness of breath.   Gastrointestinal:  Positive for abdominal distention.  All other systems reviewed and are negative.  Physical Exam Updated Vital Signs BP 127/71   Pulse 63   Temp 97.8 F (36.6 C)   Resp 20   Ht 6' (1.829 m)   Wt 105.5 kg   SpO2 98%   BMI 31.54 kg/m  Physical Exam Vitals and nursing note reviewed.  Constitutional:      General: He is not in acute distress.    Appearance: Normal appearance.     Comments: Patient is overall chronically ill-appearing.  HENT:     Head: Normocephalic and atraumatic.  Eyes:     General:        Right eye: No discharge.        Left eye: No discharge.  Cardiovascular:     Rate and Rhythm: Normal rate and regular rhythm.     Heart sounds: No murmur heard.   No friction rub. No gallop.  Pulmonary:     Effort: Pulmonary effort is normal.     Breath sounds: Normal breath sounds.     Comments: Patient with some diffuse biphasic wheezing especially in the upper lung fields, no significant crackles noted. Abdominal:     General: Bowel sounds are normal.     Comments: Patient's abdomen is somewhat taut, minimally distended without obvious fluid wave.  He has no focal tenderness to palpation, no rebound, rigidity, guarding.  Skin:    General: Skin is warm and dry.     Capillary Refill: Capillary refill takes less than 2 seconds.  Neurological:     Mental Status: He is alert and oriented to person, place, and time.  Psychiatric:        Mood and Affect: Mood normal.        Behavior: Behavior normal.    ED Results / Procedures / Treatments   Labs (all labs ordered are listed, but only abnormal results  are displayed) Labs Reviewed  COMPREHENSIVE METABOLIC PANEL - Abnormal; Notable for the following components:      Result Value   CO2 36 (*)    Creatinine, Ser 1.31 (*)    Total Protein 6.4 (*)    AST 14 (*)    GFR, Estimated 55 (*)    All other components within normal limits  CBC  DIFFERENTIAL  BRAIN NATRIURETIC PEPTIDE  TROPONIN I (HIGH SENSITIVITY)  TROPONIN I (HIGH SENSITIVITY)    EKG EKG Interpretation  Date/Time:  Thursday Sep 07 2021 11:48:04 EDT Ventricular Rate:  55 PR Interval:  202 QRS Duration: 78 QT Interval:  430 QTC Calculation: 411 R Axis:  61 Text Interpretation: Sinus bradycardia with Premature atrial complexes Otherwise normal ECG When compared with ECG of 23-Aug-2021 14:38, PREVIOUS ECG IS PRESENT No acute changes No significant change since last tracing Confirmed by Varney Biles [id number]) on 09/07/2021 1:54:17 PM  Radiology CT ABDOMEN PELVIS W CONTRAST  Result Date: 09/07/2021 CLINICAL DATA:  Abdominal pain, acute nonlocalized EXAM: CT ABDOMEN AND PELVIS WITH CONTRAST TECHNIQUE: Multidetector CT imaging of the abdomen and pelvis was performed using the standard protocol following bolus administration of intravenous contrast. RADIATION DOSE REDUCTION: This exam was performed according to the departmental dose-optimization program which includes automated exposure control, adjustment of the mA and/or kV according to patient size and/or use of iterative reconstruction technique. CONTRAST:  190mL OMNIPAQUE IOHEXOL 300 MG/ML  SOLN COMPARISON:  None Available. FINDINGS: Lower chest: Lung bases are clear. Hepatobiliary: No focal hepatic lesion. Several large gallstones within lumen the gallbladder. No gallbladder distension. Mild enhancement of the gallbladder wall. Pancreas: Pancreas is normal. No ductal dilatation. No pancreatic inflammation. Spleen: Normal spleen Adrenals/urinary tract: Adrenal glands and kidneys are normal. The ureters and bladder normal.  Stomach/Bowel: Stomach, small bowel, appendix, and cecum are normal. The colon and rectosigmoid colon are normal. Vascular/Lymphatic: Abdominal aorta is normal caliber with atherosclerotic calcification. There is no retroperitoneal or periportal lymphadenopathy. No pelvic lymphadenopathy. Reproductive: Prostate enlarged Other: No free fluid. Musculoskeletal: No aggressive osseous lesion. IMPRESSION: 1. No acute findings in the abdomen pelvis. 2. Cholelithiasis and enhancement of the gallbladder mucosa. Findings could indicate chronic cholecystitis. No gallbladder distension suggest acute cholecystitis. Electronically Signed   By: Suzy Bouchard M.D.   On: 09/07/2021 15:09   DG Chest Port 1 View  Result Date: 09/07/2021 CLINICAL DATA:  Short of breath. EXAM: PORTABLE CHEST 1 VIEW COMPARISON:  08/23/2021 FINDINGS: Heart size and mediastinal contours are unremarkable. No pleural effusion or edema identified. No airspace opacities identified. Visualized osseous structures are unremarkable. IMPRESSION: No acute cardiopulmonary abnormalities. Electronically Signed   By: Kerby Moors M.D.   On: 09/07/2021 13:38    Procedures Procedures    Medications Ordered in ED Medications  furosemide (LASIX) injection 40 mg (40 mg Intravenous Given 09/07/21 1249)  ipratropium-albuterol (DUONEB) 0.5-2.5 (3) MG/3ML nebulizer solution 3 mL (3 mLs Nebulization Given 09/07/21 1312)  iohexol (OMNIPAQUE) 300 MG/ML solution 100 mL (100 mLs Intravenous Contrast Given 09/07/21 1434)    ED Course/ Medical Decision Making/ A&P                           Medical Decision Making Amount and/or Complexity of Data Reviewed Labs: ordered. Radiology: ordered.  Risk Prescription drug management.   This patient is a 81 y.o. male who presents to the ED for concern of some abdominal distention, shortness of breath, fatigue, malaise, this involves an extensive number of treatment options, and is a complaint that carries with it a  high risk of complications and morbidity. The emergent differential diagnosis prior to evaluation includes, but is not limited to, atypical ACS, COPD exacerbation, CHF exacerbation, liver failure, hepatorenal syndrome, hepatic compromise secondary to cardiac compromise, intra-abdominal infection, cholecystitis, cholangitis, pancreatitis, other acute or surgical intra-abdominal abnormality.  Less clinical concern for Boerhaave's, Mallory-Weiss, PE based on patient's initial evaluation, and clinical presentation, as well as chronic anticoagulation however these diagnoses were considered.   This is not an exhaustive differential.   Past Medical History / Co-morbidities / Social History: hypertension, hyperlipidemia, history of MI, COPD, congestive heart failure, CKD, paroxysmal A-fib  Additional history: Chart reviewed. Pertinent results include: Lab work, imaging, and evaluation from recent hospital admission, outpatient PCP, neurology, cardiology, including recent echo were reviewed.  Recent echo with decent ejection fraction greater than 60, lowers my clinical concern for acute heart failure, hepatic compromise secondary to heart failure.  Physical Exam: Physical exam performed. The pertinent findings include: Patient is overall chronically ill-appearing with diffuse biphasic wheezing, wheezing improved after DuoNeb x1.  Patient he does have some abdominal distention, with some generalized tenderness.  No focal right upper quadrant tenderness noted.  No respiratory distress on 2 L nasal cannula.  No tenderness to palpation of the chest wall.  Lab Tests: I ordered, and personally interpreted labs.  The pertinent results include: CMP is overall unremarkable, creatinine is elevated 1.31 but this is improved or stable compared to patient's baseline.  He has some mild hypercarbia with bicarb at 36, without anion gap or evidence of acidosis.  CBC is unremarkable, including no clinically significant  leukocytosis, anemia, or platelet dysfunction.  His BNP is normal today.  His troponin is negative x2 in context of no active chest pain, chest and increased shortness of breath with exertion.   Imaging Studies: I ordered imaging studies including plain film radiograph the chest, CT abdomen pelvis with contrast. I independently visualized and interpreted imaging which showed cholelithiasis without cholecystitis, no other acute intra-abdominal abnormality, no acute intrathoracic abnormality noted.  Notably no pleural effusion, or focal consolidation.. I agree with the radiologist interpretation.   Cardiac Monitoring:  The patient was maintained on a cardiac monitor.  My attending physician Dr. Kathrynn Humble viewed and interpreted the cardiac monitored which showed an underlying rhythm of: Sinus bradycardia, PACs noted.  No significant change from baseline.   Medications: I ordered medication including DuoNeb for wheezing, Lasix x1 for report of increased fluid. Reevaluation of the patient after these medicines showed that the patient improved. I have reviewed the patients home medicines and have made adjustments as needed.  Do not recommend increase of Lasix in context of normal BNP today.  Low clinical suspicion for acute congestive heart failure exacerbation.  Encourage follow-up with PCP for any medication adjustments.  Consider admission for ongoing fatigue, malaise, however as shortness of breath is overall improved, no acute changes noted on work-up or exam today think the patient appears stable for discharge at this time.   Disposition: After consideration of the diagnostic results and the patients response to treatment, I feel that patient appears overall stable compared to his baseline with no evidence of acute congestive heart failure exacerbation, new infection, acute cholecystitis, also abdominal discomfort or ascites.  His oxygen saturation is stable on his home nasal cannula.  I am suspicious  of some deconditioning secondary to protracted hospital stay, as well as mild COPD shortness of breath exacerbation with wheezing noted on exam today.  Encourage follow-up with PCP, can consider possible steroid versus COPD symptoms are ongoing.  Otherwise encourage continued use home meds, albuterol as needed.  I discussed this case with my attending physician Dr. Kathrynn Humble who cosigned this note including patient's presenting symptoms, physical exam, and planned diagnostics and interventions. Attending physician stated agreement with plan or made changes to plan which were implemented.  Attending physician assessed patient at bedside.  Final Clinical Impression(s) / ED Diagnoses Final diagnoses:  Shortness of breath  Malaise    Rx / DC Orders ED Discharge Orders     None         Langford Carias H, PA-C  09/07/21 [place], Ankit, MD 09/08/21 [phone]

## 2021-09-07 NOTE — Discharge Instructions (Signed)
We do not see any evidence of new infection, worsening heart failure, significant amount of fluid in your abdomen or evidence of damage to your heart on your work-up today.  As we discussed on your CT they noted that you have some stones inside the gallbladder without signs of acute gallbladder infection or blockage.  I do not think that this is what is contributing to your abdominal pain at this time.  Please continue to take your breathing treatments twice daily, and albuterol as needed in between for shortness of breath.  Continue to rest, and some of your strength should return after a few weeks after your hospitalization.  If your symptoms instead worsen I recommend that you follow-up with your PCP or return to the emergency department for further evaluation.  Even if you are feeling better I recommend that you follow-up with your PCP some time in the next 1 to 2 weeks for recheck.

## 2021-09-12 ENCOUNTER — Encounter (HOSPITAL_COMMUNITY): Payer: Medicare PPO

## 2021-09-21 NOTE — Progress Notes (Signed)
I believe CT results were reviewed during his visit with Dr. Francine Graven in April but he needs repeat CT chest wo contrast in 4 months to fu on waxing and waning nodules which are likely inflammatory (please order CT chest wo contrast for Sept/October)

## 2021-09-22 ENCOUNTER — Other Ambulatory Visit: Payer: Self-pay

## 2021-09-22 DIAGNOSIS — R911 Solitary pulmonary nodule: Secondary | ICD-10-CM

## 2021-10-11 DIAGNOSIS — M1711 Unilateral primary osteoarthritis, right knee: Secondary | ICD-10-CM | POA: Diagnosis not present

## 2021-10-29 ENCOUNTER — Emergency Department (HOSPITAL_BASED_OUTPATIENT_CLINIC_OR_DEPARTMENT_OTHER): Payer: Medicare PPO | Admitting: Radiology

## 2021-10-29 ENCOUNTER — Emergency Department (HOSPITAL_BASED_OUTPATIENT_CLINIC_OR_DEPARTMENT_OTHER)
Admission: EM | Admit: 2021-10-29 | Discharge: 2021-10-29 | Disposition: A | Discharge status: Home / Self Care | Payer: Medicare PPO | Attending: Emergency Medicine | Admitting: Emergency Medicine

## 2021-10-29 ENCOUNTER — Other Ambulatory Visit: Payer: Self-pay

## 2021-10-29 DIAGNOSIS — N189 Chronic kidney disease, unspecified: Secondary | ICD-10-CM | POA: Diagnosis not present

## 2021-10-29 DIAGNOSIS — I130 Hypertensive heart and chronic kidney disease with heart failure and stage 1 through stage 4 chronic kidney disease, or unspecified chronic kidney disease: Secondary | ICD-10-CM | POA: Diagnosis not present

## 2021-10-29 DIAGNOSIS — I509 Heart failure, unspecified: Secondary | ICD-10-CM | POA: Diagnosis not present

## 2021-10-29 DIAGNOSIS — R0602 Shortness of breath: Secondary | ICD-10-CM | POA: Diagnosis not present

## 2021-10-29 DIAGNOSIS — R079 Chest pain, unspecified: Secondary | ICD-10-CM | POA: Insufficient documentation

## 2021-10-29 DIAGNOSIS — R5383 Other fatigue: Secondary | ICD-10-CM | POA: Insufficient documentation

## 2021-10-29 DIAGNOSIS — J40 Bronchitis, not specified as acute or chronic: Secondary | ICD-10-CM | POA: Diagnosis not present

## 2021-10-29 DIAGNOSIS — Z7951 Long term (current) use of inhaled steroids: Secondary | ICD-10-CM | POA: Diagnosis not present

## 2021-10-29 DIAGNOSIS — Z79899 Other long term (current) drug therapy: Secondary | ICD-10-CM | POA: Insufficient documentation

## 2021-10-29 DIAGNOSIS — R0789 Other chest pain: Secondary | ICD-10-CM | POA: Diagnosis not present

## 2021-10-29 DIAGNOSIS — Z7901 Long term (current) use of anticoagulants: Secondary | ICD-10-CM | POA: Diagnosis not present

## 2021-10-29 DIAGNOSIS — J42 Unspecified chronic bronchitis: Secondary | ICD-10-CM | POA: Diagnosis not present

## 2021-10-29 LAB — CBC
HCT: 47.3 % (ref 39.0–52.0)
Hemoglobin: 15.3 g/dL (ref 13.0–17.0)
MCH: 28.9 pg (ref 26.0–34.0)
MCHC: 32.3 g/dL (ref 30.0–36.0)
MCV: 89.4 fL (ref 80.0–100.0)
Platelets: 166 10*3/uL (ref 150–400)
RBC: 5.29 MIL/uL (ref 4.22–5.81)
RDW: 14.7 % (ref 11.5–15.5)
WBC: 7.6 10*3/uL (ref 4.0–10.5)
nRBC: 0 % (ref 0.0–0.2)

## 2021-10-29 LAB — BASIC METABOLIC PANEL
Anion gap: 8 (ref 5–15)
BUN: 20 mg/dL (ref 8–23)
CO2: 32 mmol/L (ref 22–32)
Calcium: 9.4 mg/dL (ref 8.9–10.3)
Chloride: 99 mmol/L (ref 98–111)
Creatinine, Ser: 1.26 mg/dL — ABNORMAL HIGH (ref 0.61–1.24)
GFR, Estimated: 57 mL/min — ABNORMAL LOW (ref >60)
Glucose, Bld: 93 mg/dL (ref 70–99)
Potassium: 4.2 mmol/L (ref 3.5–5.1)
Sodium: 139 mmol/L (ref 135–145)

## 2021-10-29 LAB — BRAIN NATRIURETIC PEPTIDE: B Natriuretic Peptide: 63.5 pg/mL (ref 0.0–100.0)

## 2021-10-29 LAB — TROPONIN I (HIGH SENSITIVITY)
Troponin I (High Sensitivity): 4 ng/L (ref <18)
Troponin I (High Sensitivity): 4 ng/L (ref <18)

## 2021-10-29 MED ORDER — IPRATROPIUM-ALBUTEROL 0.5-2.5 (3) MG/3ML IN SOLN
3.0000 mL | Freq: Once | RESPIRATORY_TRACT | Status: AC
Start: 2021-10-29 — End: 2021-10-29

## 2021-10-29 MED ORDER — IPRATROPIUM-ALBUTEROL 0.5-2.5 (3) MG/3ML IN SOLN
RESPIRATORY_TRACT | Status: AC
  Administered 2021-10-29: 3 mL via RESPIRATORY_TRACT
  Filled 2021-10-29: qty 3

## 2021-10-29 MED ORDER — ALBUTEROL SULFATE (2.5 MG/3ML) 0.083% IN NEBU
2.5000 mg | INHALATION_SOLUTION | Freq: Once | RESPIRATORY_TRACT | Status: AC
  Administered 2021-10-29: 2.5 mg via RESPIRATORY_TRACT
  Filled 2021-10-29: qty 3

## 2021-10-29 MED ORDER — DEXAMETHASONE SODIUM PHOSPHATE 10 MG/ML IJ SOLN
10.0000 mg | Freq: Once | INTRAMUSCULAR | Status: AC
  Administered 2021-10-29: 10 mg via INTRAVENOUS
  Filled 2021-10-29: qty 1

## 2021-10-29 NOTE — Discharge Instructions (Addendum)
Please follow-up with your cardiologist as well as your pulmonologist.  Please continue take your home medications.

## 2021-10-29 NOTE — ED Provider Notes (Signed)
[facility] EMERGENCY DEPT Provider Note   CSN: [id number] Arrival date & time: 10/29/21  1158     History  Chief Complaint  Patient presents with   Chest Pain   Bradycardia   Shortness of Breath    Mitchell [name] is a 81 y.o. male.   Chest Pain Associated symptoms: shortness of breath   Shortness of Breath Associated symptoms: chest pain    Mitchell [name] is a 81 y.o. male with past medical history significant for hypertension, hyperlipidemia, history of MI, COPD, congestive heart failure, CKD, paroxysmal A-fib who presents with concern for continued weakness, shortness of breath, chest pain ongoing over the past 6 months.  It seems that all of his symptoms have been progressively worsening.  He states that he is not having any chest pain currently.  He states that he feels short of breath all the time and he feels that his shortness of breath is worsened even though he is on oxygen chronically 2 L by nasal cannula for COPD.  Patient states that when he is at rest/not exerting himself or walking or moving around at all he feels "I guess fine" but seems to not feel that he is breathing well in general over the past 6 months.  He denies any hemoptysis or unilateral leg swelling.  He denies any pleuritic component to his chest pain.  No nausea or vomiting or swelling.  No other associated symptoms.      Home Medications Prior to Admission medications   Medication Sig Start Date End Date Taking? Authorizing Provider  acetaminophen (TYLENOL) 500 MG tablet Take 500-1,000 mg by mouth daily as needed for headache (for arthritis pain).    [provider]  albuterol (PROVENTIL HFA;VENTOLIN HFA) 108 (90 BASE) MCG/ACT inhaler Inhale 2 puffs into the lungs every 6 (six) hours as needed for wheezing. Reported on 05/04/2015    [provider]  albuterol (PROVENTIL) (2.5 MG/3ML) 0.083% nebulizer solution Take 3 mLs (2.5 mg total) by nebulization every 6 (six)  hours as needed for wheezing or shortness of breath. 05/19/21   Martyn Ehrich, NP  allopurinol (ZYLOPRIM) 100 MG tablet Take 100 mg by mouth daily. 11/07/20   [provider]  Budeson-Glycopyrrol-Formoterol (BREZTRI AEROSPHERE) 160-9-4.8 MCG/ACT AERO Inhale 2 puffs into the lungs in the morning and at bedtime. 08/04/21   Freddi Starr, MD  diclofenac (FLECTOR) 1.3 % PTCH Place 1 patch onto the skin 2 (two) times daily. If not affordable, pls substitute OTC Voltaren Gel or cream--thanks Patient not taking: Reported on 08/23/2021 05/05/21   Nita Sells, MD  ELIQUIS 5 MG TABS tablet TAKE 1 TABLET (5 MG TOTAL) BY MOUTH 2 (TWO) TIMES DAILY. NEED ANNUAL MD APPT FOR REFILLS Patient taking differently: Take 5 mg by mouth 2 (two) times daily. 04/13/21   Belva Crome, MD  finasteride (PROSCAR) 5 MG tablet Take 5 mg by mouth daily. Reported on 05/04/2015    [provider]  guaiFENesin (MUCINEX) 600 MG 12 hr tablet Take 1 tablet (600 mg total) by mouth 2 (two) times daily. Patient taking differently: Take 300 mg by mouth 2 (two) times daily. 05/19/21   Martyn Ehrich, NP  metoprolol tartrate (LOPRESSOR) 25 MG tablet TAKE 1 TABLET TWICE DAILY Patient taking differently: Take 25 mg by mouth 2 (two) times daily. 03/22/20   Belva Crome, MD  montelukast (SINGULAIR) 10 MG tablet Take 1 tablet (10 mg total) by mouth at bedtime. 08/04/21   Dewald,  Bettina Gavia, MD  nitroGLYCERIN (NITROSTAT) 0.4 MG SL tablet Place 1 tablet (0.4 mg total) under the tongue every 5 (five) minutes as needed. Reported on 05/04/2015 Patient not taking: Reported on 08/23/2021 08/20/17   Leone Brand, NP  OXYGEN Inhale 3 L into the lungs continuous as needed (for diffiulty breathing).    [provider]  pravastatin (PRAVACHOL) 20 MG tablet Take 1 tablet (20 mg total) by mouth every evening. 08/26/21   Drema Dallas, MD  tiZANidine (ZANAFLEX) 2 MG tablet Take 1 tablet (2 mg total) by mouth every 6  (six) hours as needed for muscle spasms. 08/26/21   Drema Dallas, MD      Allergies    Lipitor [atorvastatin], Hydrocodone-acetaminophen, and Rosuvastatin    Review of Systems   Review of Systems  Respiratory:  Positive for shortness of breath.   Cardiovascular:  Positive for chest pain.    Physical Exam Updated Vital Signs BP 121/85   Pulse 65   Temp (!) 97.5 F (36.4 C) (Axillary)   Resp 14   Ht 6' (1.829 m)   Wt 105.5 kg   SpO2 97%   BMI 31.54 kg/m  Physical Exam Vitals and nursing note reviewed.  Constitutional:      Appearance: He is obese. He is not diaphoretic.     Comments: Obese chronically ill-appearing 81 year old gentleman.  Very hard of hearing.  Speaking in full sentences on 2 L nasal cannula home oxygen  HENT:     Head: Normocephalic and atraumatic.     Nose: Nose normal.     Mouth/Throat:     Mouth: Mucous membranes are moist.  Eyes:     General: No scleral icterus. Cardiovascular:     Rate and Rhythm: Normal rate and regular rhythm.     Pulses: Normal pulses.     Heart sounds: Normal heart sounds.  Pulmonary:     Effort: Pulmonary effort is normal. No respiratory distress.     Comments: Very faint end expiratory wheezing. Abdominal:     Palpations: Abdomen is soft.     Tenderness: There is no abdominal tenderness. There is no guarding or rebound.     Comments: Obese nontender abdomen.  Musculoskeletal:     Cervical back: Normal range of motion.     Right lower leg: Edema present.     Left lower leg: Edema present.     Comments: Bilateral lower extremities with scant pitting edema.  No calf tenderness bruising.  Skin:    General: Skin is warm and dry.     Capillary Refill: Capillary refill takes less than 2 seconds.  Neurological:     Mental Status: He is alert. Mental status is at baseline.  Psychiatric:        Mood and Affect: Mood normal.        Behavior: Behavior normal.    ED Results / Procedures / Treatments   Labs (all labs  ordered are listed, but only abnormal results are displayed) Labs Reviewed  BASIC METABOLIC PANEL - Abnormal; Notable for the following components:      Result Value   Creatinine, Ser 1.26 (*)    GFR, Estimated 57 (*)    All other components within normal limits  CBC  BRAIN NATRIURETIC PEPTIDE  TROPONIN I (HIGH SENSITIVITY)  TROPONIN I (HIGH SENSITIVITY)    EKG EKG Interpretation  Date/Time:  Sunday October 29 2021 12:08:05 EDT Ventricular Rate:  49 PR Interval:  208 QRS Duration: 76 QT  Interval:  442 QTC Calculation: 399 R Axis:   72 Text Interpretation: Sinus bradycardia with Premature atrial complexes Cannot rule out Anterior infarct , age undetermined Abnormal ECG When compared with ECG of 07-Sep-2021 11:48, No significant change since last tracing Confirmed by Gareth Morgan [phone]) on 10/29/2021 1:05:29 PM  Radiology DG Chest 2 View  Result Date: 10/29/2021 CLINICAL DATA:  Chest pain.  Shortness of breath. EXAM: CHEST - 2 VIEW COMPARISON:  Sep 07, 2021 FINDINGS: The heart, hila, mediastinum, lungs, and pleura are unremarkable. No cause for chest pain identified. IMPRESSION: No active cardiopulmonary disease. Electronically Signed   By: Dorise Bullion III M.D.   On: 10/29/2021 13:03    Procedures Procedures    Medications Ordered in ED Medications  ipratropium-albuterol (DUONEB) 0.5-2.5 (3) MG/3ML nebulizer solution 3 mL (3 mLs Nebulization Given 10/29/21 1211)  albuterol (PROVENTIL) (2.5 MG/3ML) 0.083% nebulizer solution 2.5 mg (2.5 mg Nebulization Given 10/29/21 1228)  dexamethasone (DECADRON) injection 10 mg (10 mg Intravenous Given 10/29/21 1748)    ED Course/ Medical Decision Making/ A&P Clinical Course as of 10/29/21 1952  Sun Oct 29, 2021  1549 6 months of CP with 1-2 times a week. States he feels SOB all the time even on oxygen. 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  [WF]  123456 Stents x2 uncertain  of when.  [WF]    Clinical Course User Index [WF] Tedd Sias, PA                           Medical Decision Making Amount and/or Complexity of Data Reviewed Labs: ordered. Radiology: ordered.  Risk Prescription drug management.   This patient presents to the ED for concern of short of breath, chest pain, fatigue, this involves a number of treatment options, and is a complaint that carries with it a high risk of complications and morbidity.  The differential diagnosis includes acute on chronic COPD exacerbation, failure to thrive/worsening of chronic disease.  A more general DDx as below  The causes for shortness of breath include but are not limited to Cardiac (AHF, pericardial effusion and tamponade, arrhythmias, ischemia, etc) Respiratory (COPD, asthma, pneumonia, pneumothorax, primary pulmonary hypertension, PE/VQ mismatch) Hematological (anemia) Neuromuscular (ALS, Guillain-Barr, etc)   Co morbidities: Discussed in HPI   Brief History:  Mitchell [name] is a 81 y.o. male with past medical history significant for hypertension, hyperlipidemia, history of MI, COPD, congestive heart failure, CKD, paroxysmal A-fib who presents with concern for continued weakness, shortness of breath, chest pain ongoing over the past 6 months.  It seems that all of his symptoms have been progressively worsening.  He states that he is not having any chest pain currently.  He states that he feels short of breath all the time and he feels that his shortness of breath is worsened even though he is on oxygen chronically 2 L by nasal cannula for COPD.  Patient states that when he is at rest/not exerting himself or walking or moving around at all he feels "I guess fine" but seems to not feel that he is breathing well in general over the past 6 months.  He denies any hemoptysis or unilateral leg swelling.  He denies any pleuritic component to his chest pain.  No nausea or vomiting or swelling.  No other  associated symptoms.    EMR reviewed including pt PMHx, past surgical history  and past visits to ER.   See HPI for more details   Lab Tests:   I personally reviewed all laboratory work and imaging. Metabolic panel without any acute abnormality specifically kidney function within normal limits and no significant electrolyte abnormalities. CBC without leukocytosis or significant anemia.  Kidney functions at baseline.  BNP is less than 100.  Troponin x2 within normal limits   Imaging Studies:  NAD. I personally reviewed all imaging studies and no acute abnormality found. I agree with radiology interpretation. IMPRESSION:  No active cardiopulmonary disease.      Electronically Signed    By: Gerome Sam III M.D.    On: 10/29/2021 13:03     Cardiac Monitoring:  The patient was maintained on a cardiac monitor.  I personally viewed and interpreted the cardiac monitored which showed an underlying rhythm of: NSR.  Bradycardic at moments however not significantly slower than his usual. EKG non-ischemic sinus bradycardia   Medicines ordered:  I ordered medication including Decadron, albuterol, DuoNeb for SOB Reevaluation of the patient after these medicines showed that the patient improved I have reviewed the patients home medicines and have made adjustments as needed   Critical Interventions:     Consults/Attending Physician   I discussed this case with my attending physician who cosigned this note including patient's presenting symptoms, physical exam, and planned diagnostics and interventions. Attending physician stated agreement with plan or made changes to plan which were implemented.   Attending physician assessed patient at bedside.    Reevaluation:  After the interventions noted above I re-evaluated patient and found that they have :improved symptoms improved but did not resolve.    Social Determinants of Health:      Problem List / ED Course:  Short  of breath, fatigue, chest pain.  Patient symptoms that have been ongoing for the past 6 months.  He certainly has numerous medical issues which could be contributing to his symptoms it seems that he was concerned because his watch showed that his heart rate was slow however on my review of EMR it appears that patient has had heart rates in the low 50s numerous times in the past year.  His heart rates here have been at the lowest 49 but predominantly are in the upper 50s and low 60s.  Treat his dyspnea with Decadron given breathing treatment here.  Will discharge home with close follow-up with PCP, pulmonology and cardiology. Home situation --I placed order for home health and placed a consult to transition of care my hope is that a physical therapist's help could be contracted I think he would benefit tremendously from this.  Dispostion:  After consideration of the diagnostic results and the patients response to treatment, I feel that the patent would benefit from outpatient follow-up, cardiology, pulmonology, PCP, home health/PT    Final Clinical Impression(s) / ED Diagnoses Final diagnoses:  Chronic bronchitis, unspecified chronic bronchitis type (HCC)  Chest pain, unspecified type  Other fatigue    Rx / DC Orders ED Discharge Orders          Ordered    Ambulatory referral to Cardiology       Comments: If you have not heard from the Cardiology office within the next 72 hours please call [phone].   10/29/21 1733    Home Health        10/29/21 1802    Face-to-face encounter (required for Medicare/Medicaid patients)       Comments: I Gailen Shelter certify that  this patient is under my care and that I, or a nurse practitioner or physician's assistant working with me, had a face-to-face encounter that meets the physician face-to-face encounter requirements with this patient on 10/29/2021. The encounter with the patient was in whole, or in part for the following medical condition(s) which  is the primary reason for home health care (List medical condition): worsening mobility and functional status due to his chronic diseases which include COPD requiring oxygen   10/29/21 1802              Tedd Sias, [place] 10/29/21 2058    Elnora Morrison, MD 10/30/21 0041

## 2021-10-29 NOTE — ED Triage Notes (Signed)
Patient arrives with complaints of shortness of breath and chest pain x1 day.  Patient wears 2-3Liters of oxygen on a daily basis and takes lasix.

## 2021-11-16 ENCOUNTER — Encounter: Payer: Self-pay | Admitting: Pulmonary Disease

## 2021-11-16 ENCOUNTER — Ambulatory Visit (INDEPENDENT_AMBULATORY_CARE_PROVIDER_SITE_OTHER): Payer: Medicare PPO | Admitting: Pulmonary Disease

## 2021-11-16 VITALS — BP 128/80 | HR 64

## 2021-11-16 DIAGNOSIS — J9611 Chronic respiratory failure with hypoxia: Secondary | ICD-10-CM | POA: Diagnosis not present

## 2021-11-16 DIAGNOSIS — J9612 Chronic respiratory failure with hypercapnia: Secondary | ICD-10-CM | POA: Diagnosis not present

## 2021-11-16 DIAGNOSIS — J441 Chronic obstructive pulmonary disease with (acute) exacerbation: Secondary | ICD-10-CM | POA: Diagnosis not present

## 2021-11-16 MED ORDER — AZITHROMYCIN 250 MG PO TABS: ORAL_TABLET | ORAL | 0 refills | Status: DC

## 2021-11-16 MED ORDER — PREDNISONE 10 MG PO TABS: 10.0000 mg | ORAL_TABLET | Freq: Every day | ORAL | 3 refills | Status: DC

## 2021-11-16 MED ORDER — BREZTRI AEROSPHERE 160-9-4.8 MCG/ACT IN AERO: 2.0000 | INHALATION_SPRAY | Freq: Two times a day (BID) | RESPIRATORY_TRACT | 0 refills | Status: DC

## 2021-11-16 MED ORDER — LORAZEPAM 0.5 MG PO TABS: 0.5000 mg | ORAL_TABLET | Freq: Every evening | ORAL | 0 refills | Status: DC | PRN

## 2021-11-16 MED ORDER — METHYLPREDNISOLONE ACETATE 80 MG/ML IJ SUSP
80.0000 mg | Freq: Once | INTRAMUSCULAR | Status: AC
  Administered 2021-11-16: 80 mg via INTRAMUSCULAR

## 2021-11-16 MED ORDER — PREDNISONE 10 MG PO TABS: ORAL_TABLET | ORAL | 0 refills | Status: AC

## 2021-11-16 NOTE — Progress Notes (Signed)
Cardiology Office Note:    Date:  11/17/2021   ID:  Mitchell [name], DOB 02-04-1941, MRN [id number]  PCP:  Daisy Floro, MD  Cardiologist:  Lesleigh Noe, MD   Referring MD: Gailen Shelter, [place]   Chief Complaint  Patient presents with   Coronary Artery Disease   Follow-up    COPD   Atrial Fibrillation    Atrial flutter, paroxysmal    History of Present Illness:    Mitchell [name] is a 81 y.o. male with a hx of  coronary artery disease with remote LAD/Diagonal cutting balloon PCI, history of RCA/PDA DES, COPD, paroxysmal atrial flutter, chronic apixaban therapy, diastolic heart failure, claudication and hypertension. COPD has progressed to requirement for home oxygen.    He is feeling bad all the time.  He stays short of breath.  He lives inside his apartment and is unable to go any place because he is legally blind.  He cannot hear.  He occasionally gets a feeling in his chest that he cannot fully characterize.  States he knows what the elephant feels like on his chest.  The feeling he has is not intense at all but it puts him in the mind of prior episodes of pain.  When he has the discomfort he does not use nitroglycerin.  It tends to go away.  He cannot tell me how often it occurs.  Does not happen every day.  This is not much different than I have heard over the years.  Severe COPD.  May require home O2.  Past Medical History:  Diagnosis Date   Anginal pain (HCC) 01/2012   Anxiety    Atherosclerotic heart disease of native coronary artery without angina pectoris    BPH (benign prostatic hypertrophy)    CAD (coronary artery disease) 2007   with LAD/Diag CBPTCA and kissing    CKD (chronic kidney disease), stage III (HCC)    Hattie Perch 01/04/2017   COPD (chronic obstructive pulmonary disease) (HCC)    "maybe" (02/14/2012)   Coronary artery disease    Depression    GERD (gastroesophageal reflux disease)    Hyperlipidemia    Hypertension    Hypoxemia    Myocardial  infarction (HCC) 01/22/2012   NSTEMI with DES to RSA (PDA)   On home oxygen therapy    Shortness of breath    "walking and laying down sometimes" (02/14/2012)    Past Surgical History:  Procedure Laterality Date   CATARACT EXTRACTION W/ INTRAOCULAR LENS  IMPLANT, BILATERAL  ~ 2010   CORONARY ANGIOPLASTY WITH STENT PLACEMENT  05/11/2005   PCI OF LAD   GANGLION CYST REMOVED  1956?   right   LEFT HEART CATHETERIZATION WITH CORONARY ANGIOGRAM Bilateral 01/24/2012   Procedure: LEFT HEART CATHETERIZATION WITH CORONARY ANGIOGRAM;  Surgeon: Lesleigh Noe, MD;  Location: [facility] CATH LAB;  Service: Cardiovascular;  Laterality: Bilateral;   REFRACTIVE SURGERY  2010   left    Current Medications: Current Meds  Medication Sig   acetaminophen (TYLENOL) 500 MG tablet Take 500-1,000 mg by mouth daily as needed for headache (for arthritis pain).   albuterol (PROVENTIL HFA;VENTOLIN HFA) 108 (90 BASE) MCG/ACT inhaler Inhale 2 puffs into the lungs every 6 (six) hours as needed for wheezing. Reported on 05/04/2015   albuterol (PROVENTIL) (2.5 MG/3ML) 0.083% nebulizer solution Take 3 mLs (2.5 mg total) by nebulization every 6 (six) hours as needed for wheezing or shortness of breath.   allopurinol (ZYLOPRIM) 100 MG tablet  Take 100 mg by mouth daily.   azithromycin (ZITHROMAX) 250 MG tablet Take as directed   diclofenac (FLECTOR) 1.3 % PTCH Place 1 patch onto the skin 2 (two) times daily. If not affordable, pls substitute OTC Voltaren Gel or cream--thanks   ELIQUIS 5 MG TABS tablet TAKE 1 TABLET (5 MG TOTAL) BY MOUTH 2 (TWO) TIMES DAILY. NEED ANNUAL MD APPT FOR REFILLS (Patient taking differently: Take 5 mg by mouth 2 (two) times daily.)   finasteride (PROSCAR) 5 MG tablet Take 5 mg by mouth daily. Reported on 05/04/2015   guaiFENesin (MUCINEX) 600 MG 12 hr tablet Take 1 tablet (600 mg total) by mouth 2 (two) times daily. (Patient taking differently: Take 300 mg by mouth 2 (two) times daily.)   metoprolol  tartrate (LOPRESSOR) 25 MG tablet TAKE 1 TABLET TWICE DAILY (Patient taking differently: Take 25 mg by mouth 2 (two) times daily.)   nitroGLYCERIN (NITROSTAT) 0.4 MG SL tablet Place 1 tablet (0.4 mg total) under the tongue every 5 (five) minutes as needed. Reported on 05/04/2015   OXYGEN Inhale 3 L into the lungs continuous as needed (for diffiulty breathing).   pravastatin (PRAVACHOL) 20 MG tablet Take 1 tablet (20 mg total) by mouth every evening.   predniSONE (DELTASONE) 10 MG tablet Take 4 tablets (40 mg total) by mouth daily with breakfast for 3 days, THEN 3 tablets (30 mg total) daily with breakfast for 3 days, THEN 2 tablets (20 mg total) daily with breakfast for 3 days, THEN 1 tablet (10 mg total) daily with breakfast for 3 days.   predniSONE (DELTASONE) 10 MG tablet Take 1 tablet (10 mg total) by mouth daily with breakfast.     Allergies:   Lipitor [atorvastatin], Hydrocodone-acetaminophen, and Rosuvastatin   Social History   Socioeconomic History   Marital status: Single    Spouse name: Not on file   Number of children: 3   Years of education: Not on file   Highest education level: Not on file  Occupational History   Not on file  Tobacco Use   Smoking status: Former    Types: Pipe    Quit date: 04/28/2011    Years since quitting: 10.5   Smokeless tobacco: Never  Vaping Use   Vaping Use: Never used  Substance and Sexual Activity   Alcohol use: Not Currently    Alcohol/week: 7.0 standard drinks of alcohol    Types: 7 Standard drinks or equivalent per week    Comment: 02/14/2012 "weekends I have 6-7 mixed drinks"     occasional   Drug use: No   Sexual activity: Yes  Other Topics Concern   Not on file  Social History Narrative   Truck driver      Social Determinants of Health   Financial Resource Strain: Not on file  Food Insecurity: Not on file  Transportation Needs: No Transportation Needs (08/24/2021)   PRAPARE - Hydrologist (Medical):  No    Lack of Transportation (Non-Medical): No  Physical Activity: Not on file  Stress: Not on file  Social Connections: Not on file     Family History: The patient's family history includes Heart failure in his brother and mother.  ROS:   Please see the history of present illness.    Does not smoke.  His ex-wife of 40 years ago is now 1 who helps to take care of him since he is incapacitated.  All other systems reviewed and are negative.  EKGs/Labs/Other  Studies Reviewed:    The following studies were reviewed today: No new or recent imaging  EKG:  EKG when performed on 11/01/2021, sinus bradycardia at 47 bpm but otherwise unremarkable.  Today the electrocardiogram reveals sinus rhythm at 64 bpm with normal overall appearance.  Recent Labs: 08/26/2021: Magnesium 2.2 09/07/2021: ALT 11 10/29/2021: B Natriuretic Peptide 63.5; BUN 20; Creatinine, Ser 1.26; Hemoglobin 15.3; Platelets 166; Potassium 4.2; Sodium 139  Recent Lipid Panel    Component Value Date/Time   CHOL 202 (H) 08/24/2021 0837   CHOL 167 02/05/2018 0744   TRIG 108 08/24/2021 0837   HDL 63 08/24/2021 0837   HDL 54 02/05/2018 0744   CHOLHDL 3.2 08/24/2021 0837   VLDL 22 08/24/2021 0837   LDLCALC 117 (H) 08/24/2021 0837   LDLCALC 96 02/05/2018 0744    Physical Exam:    VS:  BP 114/68   Pulse 64   Ht 6' (1.829 m)   Wt 224 lb (101.6 kg)   SpO2 96%   BMI 30.38 kg/m     Wt Readings from Last 3 Encounters:  11/17/21 224 lb (101.6 kg)  10/29/21 232 lb 9.4 oz (105.5 kg)  09/07/21 232 lb 9.4 oz (105.5 kg)    Blood pressure is 130/68 mmHg on repeat GEN: Obese. No acute distress HEENT: Normal NECK: No JVD. LYMPHATICS: No lymphadenopathy CARDIAC: No murmur. RRR no gallop, or edema. VASCULAR:  Normal Pulses. No bruits. RESPIRATORY:  Clear to auscultation without rales, wheezing or rhonchi  ABDOMEN: Soft, non-tender, non-distended, No pulsatile mass, MUSCULOSKELETAL: No deformity  SKIN: Warm and  dry NEUROLOGIC: Cannot hear and legally blind.  Alert and oriented x 3. PSYCHIATRIC:  Normal affect   ASSESSMENT:    1. Coronary artery disease involving native coronary artery of native heart with angina pectoris ([place])   2. Atypical atrial flutter (HCC)   3. Essential hypertension   4. Mixed hyperlipidemia   5. Chronic obstructive pulmonary disease, unspecified COPD type ([place])    PLAN:    In order of problems listed above:  Continue statin therapy and blood pressure control.  The sensation in his chest may be angina.  Add Imdur 30 mg/day.  He is not ambulatory because of hearing and vision disturbance.  He is advised to use sublingual nitroglycerin if any acute episodes of pain and to report those to [place].  My instincts tell me that an ischemic evaluation is indicated at this time.  A lot of his chest complaints could also be related to pulmonary issues. Continue anticoagulation therapy. Blood pressure control is adequate.  Continue current therapy which includes Lopressor 25 mg twice daily.  We need to monitor therapy and determine if dose reduction is necessary because he reports some heart rates in the high 40s based on his pulse oximeter. Continue Pravachol 20 mg/day. Being seen by pulmonary.   Overall education and awareness concerning primary/secondary risk prevention was discussed in detail: LDL less than 70, hemoglobin A1c less than 7, blood pressure target less than 130/80 mmHg, >150 minutes of moderate aerobic activity per week, avoidance of smoking, weight control (via diet and exercise), and continued surveillance/management of/for obstructive sleep apnea.   Return 8-12 months. Earlier if chest pains worsen.   Medication Adjustments/Labs and Tests Ordered: Current medicines are reviewed at length with the patient today.  Concerns regarding medicines are outlined above.  No orders of the defined types were placed in this encounter.  No orders of the defined types were  placed in this encounter.  There are no Patient Instructions on file for this visit.   Signed, Lesleigh Noe, MD  11/17/2021 10:11 AM    [facility]

## 2021-11-16 NOTE — Patient Instructions (Addendum)
Start Steroid taper as prescribed  Start Zpak antibiotic  We will give you duoneb nebulizer treatment today and steroid shot  Continue breztri inhaler  Continue albuterol inhaler or nebulizer treatment as needed  Follow up in 1 month

## 2021-11-16 NOTE — Progress Notes (Addendum)
Synopsis: Referred in September 2022 for COPD by Evangeline Gulaortni Coutre, PA  Subjective:   PATIENT ID: Mitchell CholJarald D [name] GENDER: male DOB: Apr 16, 1941, MRN: [id number]  HPI  Chief Complaint  Patient presents with   Follow-up   Mitchell [name] is an 81 year old male, former smoker with CAD, CKD stage III, GERD, hypertension, chronic hypoxemic respiratory failure due to emphysema who returns to pulmonary clinic for COPD.  He reports increased shortness of breath since yesterday. He feels he is struggling to breath at this time.   He was treated in ER 10/29/21 for COPD exacerbation.   OV 08/04/21 He was seen by Buelah ManisBeth Walsh, NP on 05/19/21 for hospital follow up after being admitted for COPD exacerbation. His trelegy was increased to 200 at last visit. A new 11-4314mm RUL nodule was noted on CTA on 05/02/21. Repeat CT Chest 08/01/21 showed mostly resolved RUL nodule. New irregular nodule of the left lower lobe abutting the posterior pleural margin.  He reports he feels he is getting worse. He is having a lot of trouble breathing. He had covid 2 months ago.   Using nebulizer machine 2-3 times per day. Using 2-3L of oxygen day/night. He has been out of trelegy ellipta 200 over the past 2 weeks.   OV 01/13/2021 He had an ER visit 12/13/20 for dyspnea and was treated for COPD exacerbation. He has been doing ok since last month but continues to have bad days with his breathing throughout the week with increased cough, wheezing and dyspnea.   He is using dulera 100-625mcg 2 puffs twice daily and as needed albuterol throughout the day. He has limited eyesight and reports some difficulty using his inhalers.   He is a former smoker, smoked a pipe from [place] to 2013. He also reports a significant amount of second hand smoke throughout his life. He worked as a Naval architecttruck driver.   He is on 2.5L of supplemental oxygen at home which he uses throughout the day and night.    Past Medical History:  Diagnosis Date   Anginal pain  (HCC) 01/2012   Anxiety    Atherosclerotic heart disease of native coronary artery without angina pectoris    BPH (benign prostatic hypertrophy)    CAD (coronary artery disease) 2007   with LAD/Diag CBPTCA and kissing    CKD (chronic kidney disease), stage III (HCC)    Hattie Perch/notes 01/04/2017   COPD (chronic obstructive pulmonary disease) (HCC)    "maybe" (02/14/2012)   Coronary artery disease    Depression    GERD (gastroesophageal reflux disease)    Hyperlipidemia    Hypertension    Hypoxemia    Myocardial infarction (HCC) 01/22/2012   NSTEMI with DES to RSA (PDA)   On home oxygen therapy    Shortness of breath    "walking and laying down sometimes" (02/14/2012)     Family History  Problem Relation Age of Onset   Heart failure Mother    Heart failure Brother      Social History   Socioeconomic History   Marital status: Single    Spouse name: Not on file   Number of children: 3   Years of education: Not on file   Highest education level: Not on file  Occupational History   Not on file  Tobacco Use   Smoking status: Former    Types: Pipe    Quit date: 04/28/2011    Years since quitting: 10.5   Smokeless tobacco: Never  Vaping Use  Vaping Use: Never used  Substance and Sexual Activity   Alcohol use: Not Currently    Alcohol/week: 7.0 standard drinks of alcohol    Types: 7 Standard drinks or equivalent per week    Comment: 02/14/2012 "weekends I have 6-7 mixed drinks"     occasional   Drug use: No   Sexual activity: Yes  Other Topics Concern   Not on file  Social History Narrative   Truck driver      Social Determinants of Health   Financial Resource Strain: Not on file  Food Insecurity: Not on file  Transportation Needs: No Transportation Needs (08/24/2021)   PRAPARE - Administrator, Civil Service (Medical): No    Lack of Transportation (Non-Medical): No  Physical Activity: Not on file  Stress: Not on file  Social Connections: Not on file   Intimate Partner Violence: Not on file     Allergies  Allergen Reactions   Lipitor [Atorvastatin] Other (See Comments)    "just paralyzed me; I can't take it"   Hydrocodone-Acetaminophen Hives   Rosuvastatin     Leg aches     Outpatient Medications Prior to Visit  Medication Sig Dispense Refill   acetaminophen (TYLENOL) 500 MG tablet Take 500-1,000 mg by mouth daily as needed for headache (for arthritis pain).     albuterol (PROVENTIL HFA;VENTOLIN HFA) 108 (90 BASE) MCG/ACT inhaler Inhale 2 puffs into the lungs every 6 (six) hours as needed for wheezing. Reported on 05/04/2015     albuterol (PROVENTIL) (2.5 MG/3ML) 0.083% nebulizer solution Take 3 mLs (2.5 mg total) by nebulization every 6 (six) hours as needed for wheezing or shortness of breath. 75 mL 12   allopurinol (ZYLOPRIM) 100 MG tablet Take 100 mg by mouth daily.     diclofenac (FLECTOR) 1.3 % PTCH Place 1 patch onto the skin 2 (two) times daily. If not affordable, pls substitute OTC Voltaren Gel or cream--thanks 20 patch 0   ELIQUIS 5 MG TABS tablet TAKE 1 TABLET (5 MG TOTAL) BY MOUTH 2 (TWO) TIMES DAILY. NEED ANNUAL MD APPT FOR REFILLS (Patient taking differently: Take 5 mg by mouth 2 (two) times daily.) 180 tablet 1   finasteride (PROSCAR) 5 MG tablet Take 5 mg by mouth daily. Reported on 05/04/2015     guaiFENesin (MUCINEX) 600 MG 12 hr tablet Take 1 tablet (600 mg total) by mouth 2 (two) times daily. (Patient taking differently: Take 300 mg by mouth 2 (two) times daily.) 60 tablet 1   metoprolol tartrate (LOPRESSOR) 25 MG tablet TAKE 1 TABLET TWICE DAILY (Patient taking differently: Take 25 mg by mouth 2 (two) times daily.) 180 tablet 1   nitroGLYCERIN (NITROSTAT) 0.4 MG SL tablet Place 1 tablet (0.4 mg total) under the tongue every 5 (five) minutes as needed. Reported on 05/04/2015 25 tablet 3   OXYGEN Inhale 3 L into the lungs continuous as needed (for diffiulty breathing).     Budeson-Glycopyrrol-Formoterol (BREZTRI  AEROSPHERE) 160-9-4.8 MCG/ACT AERO Inhale 2 puffs into the lungs in the morning and at bedtime. (Patient not taking: Reported on 11/17/2021) 10.7 g 6   montelukast (SINGULAIR) 10 MG tablet Take 1 tablet (10 mg total) by mouth at bedtime. (Patient not taking: Reported on 11/17/2021) 30 tablet 11   pravastatin (PRAVACHOL) 20 MG tablet Take 1 tablet (20 mg total) by mouth every evening. 30 tablet 0   tiZANidine (ZANAFLEX) 2 MG tablet Take 1 tablet (2 mg total) by mouth every 6 (six) hours as needed  for muscle spasms. (Patient not taking: Reported on 11/17/2021) 20 tablet 0   No facility-administered medications prior to visit.   Review of Systems  Constitutional:  Negative for chills, fever, malaise/fatigue and weight loss.  HENT:  Negative for congestion, sinus pain and sore throat.   Eyes: Negative.   Respiratory:  Positive for cough, sputum production, shortness of breath and wheezing. Negative for hemoptysis.   Cardiovascular:  Negative for chest pain, palpitations, orthopnea, claudication and leg swelling.  Gastrointestinal:  Negative for abdominal pain, heartburn, nausea and vomiting.  Genitourinary: Negative.   Musculoskeletal:  Negative for joint pain and myalgias.  Skin:  Negative for rash.  Neurological:  Negative for weakness.  Endo/Heme/Allergies: Negative.   Psychiatric/Behavioral: Negative.      Objective:   Vitals:   11/16/21 1130  BP: 128/80  Pulse: 64  SpO2: 92%   Physical Exam Constitutional:      General: He is not in acute distress. HENT:     Head: Normocephalic and atraumatic.  Eyes:     Extraocular Movements: Extraocular movements intact.     Conjunctiva/sclera: Conjunctivae normal.     Pupils: Pupils are equal, round, and reactive to light.  Cardiovascular:     Rate and Rhythm: Normal rate and regular rhythm.     Pulses: Normal pulses.     Heart sounds: Normal heart sounds. No murmur heard. Pulmonary:     Effort: No respiratory distress.     Breath sounds:  Decreased air movement present. Decreased breath sounds present. No wheezing or rhonchi.  Abdominal:     General: Bowel sounds are normal.     Palpations: Abdomen is soft.  Musculoskeletal:     Right lower leg: No edema.     Left lower leg: No edema.  Lymphadenopathy:     Cervical: No cervical adenopathy.  Skin:    General: Skin is warm and dry.  Neurological:     General: No focal deficit present.     Mental Status: He is alert.  Psychiatric:        Mood and Affect: Mood normal.        Behavior: Behavior normal.        Thought Content: Thought content normal.        Judgment: Judgment normal.     CBC    Component Value Date/Time   WBC 7.6 10/29/2021 1237   RBC 5.29 10/29/2021 1237   HGB 15.3 10/29/2021 1237   HGB 15.9 01/30/2021 0000   HCT 47.3 10/29/2021 1237   HCT 46.1 01/30/2021 0000   PLT 166 10/29/2021 1237   PLT 176 01/30/2021 0000   MCV 89.4 10/29/2021 1237   MCV 87 01/30/2021 0000   MCH 28.9 10/29/2021 1237   MCHC 32.3 10/29/2021 1237   RDW 14.7 10/29/2021 1237   RDW 12.9 01/30/2021 0000   LYMPHSABS 0.9 09/07/2021 1148   MONOABS 0.8 09/07/2021 1148   EOSABS 0.3 09/07/2021 1148   BASOSABS 0.1 09/07/2021 1148   Chest imaging: CXR 10/29/21 No active cardiopulmonary disease  CT Chest 08/01/21 1. Nodule noted in the right upper lobe on the prior CT has mostly resolved consistent near complete resolution of an inflammatory nodule, excluding malignancy. 2. New irregular nodule, abutting the posterior pleural margin, left lower lobe, 9 mm. Additional 3-4 mm new nodules in the superior segment of the right lower lobe. New tiny ill-defined irregular opacity in the right upper lobe image 39, series 7. 3. Overall findings are consistent with inflammation, with waxing  and waning nodules.   CXR 12/13/20 The heart and mediastinal contours are unchanged. Aortic calcification. No focal consolidation. No pulmonary edema. No pleural effusion. No pneumothorax. No  acute osseous abnormality.  CT Chest 2018 Hyperinflation of the lungs consistent with COPD.   Minimal peribronchial airspace opacities in bilateral lower lobes may represent atelectasis versus peribronchial airspace consolidation.   PFT:     No data to display         2016: FEV1/FVC 58, FEV1 38%, FVC 50%  Echo 12/2016: LVEF 55-60%. Grade I diastolic dysfunction. RV size and function is normal.   Assessment & Plan:   COPD with acute exacerbation ([place]) - Plan: predniSONE (DELTASONE) 10 MG tablet, azithromycin (ZITHROMAX) 250 MG tablet, Split night study, predniSONE (DELTASONE) 10 MG tablet, Ambulatory referral to [facility], methylPREDNISolone acetate (DEPO-MEDROL) injection 80 mg, DISCONTINUED: LORazepam (ATIVAN) 0.5 MG tablet  Chronic respiratory failure with hypoxia and hypercapnia (HCC)  Discussion: Mitchell [name] is an 81 year old male, former smoker with CAD, CKD stage III, GERD, hypertension, chronic hypoxemic respiratory failure due to emphysema who returns to pulmonary clinic for COPD.  He has acute COPD exacerbation. We will treat him with prednisone taper and Zpak. He is to continue on 10mg  of prednisone daily for palliative measures for his COPD.   He is to continue breztri inhaler therapy and as needed albuterol inhaler/nebulizer treatments.   He has chronic hypercapnic and hypoxemic respiratory failure. I believe he would benefit from CPAP or Bipap therapy. He has serum bicarb level ranging 32 to 36 in the past. We will have a sleep study ordered.   His understanding of using inhalers is hindered by his poor eye sight and being hard of hearing. His ex-wife has accompanied him and helps as best she can. We will order home health nursing to help him with his medications.  He is to continue supplemental oxygen for respiratory failure.   Follow-up in 4 weeks.  Freda Jackson, MD [facility] Office: [phone]   Current Outpatient  Medications:    acetaminophen (TYLENOL) 500 MG tablet, Take 500-1,000 mg by mouth daily as needed for headache (for arthritis pain)., Disp: , Rfl:    albuterol (PROVENTIL HFA;VENTOLIN HFA) 108 (90 BASE) MCG/ACT inhaler, Inhale 2 puffs into the lungs every 6 (six) hours as needed for wheezing. Reported on 05/04/2015, Disp: , Rfl:    albuterol (PROVENTIL) (2.5 MG/3ML) 0.083% nebulizer solution, Take 3 mLs (2.5 mg total) by nebulization every 6 (six) hours as needed for wheezing or shortness of breath., Disp: 75 mL, Rfl: 12   allopurinol (ZYLOPRIM) 100 MG tablet, Take 100 mg by mouth daily., Disp: , Rfl:    azithromycin (ZITHROMAX) 250 MG tablet, Take as directed, Disp: 6 tablet, Rfl: 0   diclofenac (FLECTOR) 1.3 % PTCH, Place 1 patch onto the skin 2 (two) times daily. If not affordable, pls substitute OTC Voltaren Gel or cream--thanks, Disp: 20 patch, Rfl: 0   ELIQUIS 5 MG TABS tablet, TAKE 1 TABLET (5 MG TOTAL) BY MOUTH 2 (TWO) TIMES DAILY. NEED ANNUAL MD APPT FOR REFILLS (Patient taking differently: Take 5 mg by mouth 2 (two) times daily.), Disp: 180 tablet, Rfl: 1   finasteride (PROSCAR) 5 MG tablet, Take 5 mg by mouth daily. Reported on 05/04/2015, Disp: , Rfl:    guaiFENesin (MUCINEX) 600 MG 12 hr tablet, Take 1 tablet (600 mg total) by mouth 2 (two) times daily. (Patient taking differently: Take 300 mg by mouth 2 (two) times  daily.), Disp: 60 tablet, Rfl: 1   metoprolol tartrate (LOPRESSOR) 25 MG tablet, TAKE 1 TABLET TWICE DAILY (Patient taking differently: Take 25 mg by mouth 2 (two) times daily.), Disp: 180 tablet, Rfl: 1   nitroGLYCERIN (NITROSTAT) 0.4 MG SL tablet, Place 1 tablet (0.4 mg total) under the tongue every 5 (five) minutes as needed. Reported on 05/04/2015, Disp: 25 tablet, Rfl: 3   OXYGEN, Inhale 3 L into the lungs continuous as needed (for diffiulty breathing)., Disp: , Rfl:    predniSONE (DELTASONE) 10 MG tablet, Take 4 tablets (40 mg total) by mouth daily with breakfast for 3 days,  THEN 3 tablets (30 mg total) daily with breakfast for 3 days, THEN 2 tablets (20 mg total) daily with breakfast for 3 days, THEN 1 tablet (10 mg total) daily with breakfast for 3 days., Disp: 30 tablet, Rfl: 0   predniSONE (DELTASONE) 10 MG tablet, Take 1 tablet (10 mg total) by mouth daily with breakfast., Disp: 30 tablet, Rfl: 3   isosorbide mononitrate (IMDUR) 30 MG 24 hr tablet, Take 1 tablet (30 mg total) by mouth every evening. Take after 5pm., Disp: 90 tablet, Rfl: 3   pravastatin (PRAVACHOL) 20 MG tablet, Take 1 tablet (20 mg total) by mouth every evening., Disp: 90 tablet, Rfl: 3

## 2021-11-17 ENCOUNTER — Ambulatory Visit: Payer: Medicare PPO | Admitting: Interventional Cardiology

## 2021-11-17 ENCOUNTER — Encounter: Payer: Self-pay | Admitting: Interventional Cardiology

## 2021-11-17 VITALS — BP 114/68 | HR 64 | Ht 72.0 in | Wt 224.0 lb

## 2021-11-17 DIAGNOSIS — E782 Mixed hyperlipidemia: Secondary | ICD-10-CM

## 2021-11-17 DIAGNOSIS — J449 Chronic obstructive pulmonary disease, unspecified: Secondary | ICD-10-CM | POA: Diagnosis not present

## 2021-11-17 DIAGNOSIS — I25119 Atherosclerotic heart disease of native coronary artery with unspecified angina pectoris: Secondary | ICD-10-CM | POA: Diagnosis not present

## 2021-11-17 DIAGNOSIS — I10 Essential (primary) hypertension: Secondary | ICD-10-CM | POA: Diagnosis not present

## 2021-11-17 DIAGNOSIS — I484 Atypical atrial flutter: Secondary | ICD-10-CM | POA: Diagnosis not present

## 2021-11-17 MED ORDER — PRAVASTATIN SODIUM 20 MG PO TABS: 20.0000 mg | ORAL_TABLET | Freq: Every evening | ORAL | 3 refills | Status: AC

## 2021-11-17 MED ORDER — ISOSORBIDE MONONITRATE ER 30 MG PO TB24: 30.0000 mg | ORAL_TABLET | Freq: Every evening | ORAL | 3 refills | Status: DC

## 2021-11-17 NOTE — Patient Instructions (Signed)
Medication Instructions:  Your physician has recommended you make the following change in your medication:   1) START Isosorbide mononitrate (Imdur) 30mg  every evening after 5pm  If you need assistance for cost of Eliquis please call [employer] at [phone].  *If you need a refill on your cardiac medications before your next appointment, please call your pharmacy*   Lab Work: NONE  Testing/Procedures: NONE  Follow-Up: At [phone], you and your health needs are our priority.  As part of our continuing mission to provide you with exceptional heart care, we have created designated Provider Care Teams.  These Care Teams include your primary Cardiologist (physician) and Advanced Practice Providers (APPs -  Physician Assistants and Nurse Practitioners) who all work together to provide you with the care you need, when you need it.  Your next appointment:   9-12 month(s)  The format for your next appointment:   In Person  Provider:   [employer], MD {  Important Information About Sugar

## 2021-11-23 ENCOUNTER — Telehealth: Payer: Self-pay | Admitting: Pulmonary Disease

## 2021-11-23 DIAGNOSIS — J9611 Chronic respiratory failure with hypoxia: Secondary | ICD-10-CM

## 2021-11-23 NOTE — Telephone Encounter (Signed)
Addendum made to most recent office note. The sleep study should be ordered under chronic hypercapnic and hypoxemic respiratory failure.   Thanks, JD

## 2021-11-23 NOTE — Telephone Encounter (Signed)
Received a message and fax from patient's insurance company about the sleep study being denied for patient. The diagnosis used for the study was acute COPD exacerbation and the insurance will not pay for the study with this diagnosis.   They are also asking for the office notes to be updated to include why a sleep study is being ordered for the patient.   Dr. Francine Graven, would you be willing to update your note on why the sleep study is being ordered? Thanks!

## 2021-11-24 NOTE — Telephone Encounter (Signed)
Spoke with Mitchell [name] via Teams. She has requested to have the sleep study ordered again with the updated diagnosis codes. Order has been placed and she will resubmit the claim.   Will close encounter.

## 2021-12-04 ENCOUNTER — Telehealth: Payer: Self-pay | Admitting: Primary Care

## 2021-12-04 NOTE — Telephone Encounter (Signed)
Hey Dr Francine Graven,  Would you like to follow up with this patient after his CT scan that is scheduled for 01/12/22.   Or would you like [place] to just call with results.   Please advise sir

## 2021-12-04 NOTE — Telephone Encounter (Signed)
Patient has CT scheduled for September 29th but I was wondering if you need them to follow up with you in office after scan.   Patient just saw Dr Francine Graven on 11/23/21.  Please advise if you want me to call pt and schedule them for follow up with you after scan.   Thank you

## 2021-12-04 NOTE — Telephone Encounter (Signed)
She has an apt with Dr. Francine Graven on 9/7, I would check with him. Depending on how he is doing may want to see him but typically we can call with results

## 2021-12-05 NOTE — Telephone Encounter (Signed)
I can see him after his CT scan in late Sepetember. The appointment in early September can be cancelled since it will be pushed back to late September.  Thanks, JD

## 2021-12-05 NOTE — Telephone Encounter (Signed)
Spk with pt advised JD wanted to see him after CT scan. Sept appt cancelled rescheduled to 02/07/22 @1p   Nothing further

## 2021-12-21 ENCOUNTER — Ambulatory Visit: Payer: Medicare PPO | Admitting: Pulmonary Disease

## 2021-12-24 ENCOUNTER — Ambulatory Visit (HOSPITAL_BASED_OUTPATIENT_CLINIC_OR_DEPARTMENT_OTHER): Payer: Medicare PPO | Attending: Pulmonary Disease | Admitting: Pulmonary Disease

## 2021-12-24 DIAGNOSIS — E669 Obesity, unspecified: Secondary | ICD-10-CM | POA: Insufficient documentation

## 2021-12-24 DIAGNOSIS — J449 Chronic obstructive pulmonary disease, unspecified: Secondary | ICD-10-CM | POA: Diagnosis not present

## 2021-12-24 DIAGNOSIS — Z6833 Body mass index (BMI) 33.0-33.9, adult: Secondary | ICD-10-CM | POA: Insufficient documentation

## 2021-12-24 DIAGNOSIS — I509 Heart failure, unspecified: Secondary | ICD-10-CM | POA: Diagnosis not present

## 2021-12-24 DIAGNOSIS — R0683 Snoring: Secondary | ICD-10-CM | POA: Diagnosis not present

## 2021-12-24 DIAGNOSIS — G4736 Sleep related hypoventilation in conditions classified elsewhere: Secondary | ICD-10-CM | POA: Insufficient documentation

## 2021-12-24 DIAGNOSIS — J9612 Chronic respiratory failure with hypercapnia: Secondary | ICD-10-CM | POA: Diagnosis not present

## 2021-12-24 DIAGNOSIS — J9611 Chronic respiratory failure with hypoxia: Secondary | ICD-10-CM | POA: Diagnosis not present

## 2021-12-25 ENCOUNTER — Telehealth: Payer: Self-pay | Admitting: Pulmonary Disease

## 2021-12-25 DIAGNOSIS — R0683 Snoring: Secondary | ICD-10-CM | POA: Diagnosis not present

## 2021-12-25 DIAGNOSIS — J9612 Chronic respiratory failure with hypercapnia: Secondary | ICD-10-CM

## 2021-12-25 DIAGNOSIS — J9611 Chronic respiratory failure with hypoxia: Secondary | ICD-10-CM | POA: Diagnosis not present

## 2021-12-25 NOTE — Procedures (Signed)
     Patient Name: Mitchell [name], Mitchell [name] Date: 12/24/2021 Gender: Male D.O.B: 1940/08/12 Age (years): 81 Referring Provider: Melody Comas Height (inches): 69 Interpreting Physician: Coralyn Helling MD, ABSM Weight (lbs): 222 RPSGT: Armen Pickup BMI: 33 MRN: [id number] Neck Size: 19.00  CLINICAL INFORMATION Sleep Study Type: NPSG  Indication for sleep study: Congestive Heart Failure, COPD, Obesity, Snoring  Epworth Sleepiness Score: 4  SLEEP STUDY TECHNIQUE As per the AASM Manual for the Scoring of Sleep and Associated Events v2.3 (April 2016) with a hypopnea requiring 4% desaturations.  The channels recorded and monitored were frontal, central and occipital EEG, electrooculogram (EOG), submentalis EMG (chin), nasal and oral airflow, thoracic and abdominal wall motion, anterior tibialis EMG, snore microphone, electrocardiogram, and pulse oximetry.  MEDICATIONS Medications self-administered by patient taken the night of the study : N/A  SLEEP ARCHITECTURE The study was initiated at 9:51:09 PM and ended at 4:31:09 AM.  Sleep onset time was 22.2 minutes and the sleep efficiency was 47.6%%. The total sleep time was 190.5 minutes.  Stage REM latency was 110.5 minutes.  The patient spent 9.4%% of the night in stage N1 sleep, 86.4%% in stage N2 sleep, 0.0%% in stage N3 and 4.2% in REM.  Alpha intrusion was absent.  Supine sleep was 37.01%.  RESPIRATORY PARAMETERS The overall apnea/hypopnea index (AHI) was 3.5 per hour. There were 11 total apneas, including 11 obstructive, 0 central and 0 mixed apneas. There were 0 hypopneas and 4 RERAs.  The AHI during Stage REM sleep was 7.5 per hour.  AHI while supine was 8.5 per hour.  The mean oxygen saturation was 96.8%. The minimum SpO2 during sleep was 93.0%.  soft snoring was noted during this study.  CARDIAC DATA The 2 lead EKG demonstrated sinus rhythm. The mean heart rate was 63.3 beats per minute. Other EKG findings  include: None.  LEG MOVEMENT DATA The total PLMS were 0 with a resulting PLMS index of 0.0. Associated arousal with leg movement index was 0.0 .  IMPRESSIONS - While he had several obstructive respiratory events these were frequent enough to qualify for a diagnosis of obstrucitve sleep apnea.  However he had limited sleep time due to difficulty with sleep initiation and sleep maintenance.  Therefore it is difficult to determine whether he might have obstructive sleep apnea if he had adequate sleep time. - His overall AHI was 3.5 with an SpO2 low of 93%.  He wore 3 liters supplemental oxygen during this study. - The patient snored with soft snoring volume.  DIAGNOSIS - Nocturnal Hypoxemia. - Snoring.  RECOMMENDATIONS - If there is still significant concern for obstructive sleep apnea, then he could have his sleep study repeated and have him use a sleep aide medication on the night of his study. - Continue 3 liters oxygen at night. - Avoid alcohol, sedatives and other CNS depressants that may worsen sleep apnea and disrupt normal sleep architecture. - Sleep hygiene should be reviewed to assess factors that may improve sleep quality. - Weight management and regular exercise should be initiated or continued if appropriate.  [Electronically signed] 12/25/2021 03:23 PM  Coralyn Helling MD, ABSM Diplomate, American Board of Sleep Medicine NPI: [id number]  [facility] PH: [phone]   FX: [phone] ACCREDITED BY THE AMERICAN ACADEMY OF SLEEP MEDICINE

## 2021-12-25 NOTE — Telephone Encounter (Signed)
Please let patient know the results below from his sleep study:  - No concern for sleep apnea based on test, but the study is very limited because he did not sleep much during the night. - If there is still significant concern for obstructive sleep apnea, then he could have his sleep study repeated and have him use a sleep aide medication on the night of his study. - Continue 3 liters oxygen at night.  Thanks, JD

## 2021-12-26 DIAGNOSIS — L570 Actinic keratosis: Secondary | ICD-10-CM | POA: Diagnosis not present

## 2022-01-11 DIAGNOSIS — G4700 Insomnia, unspecified: Secondary | ICD-10-CM | POA: Diagnosis not present

## 2022-01-11 DIAGNOSIS — Z6834 Body mass index (BMI) 34.0-34.9, adult: Secondary | ICD-10-CM | POA: Diagnosis not present

## 2022-01-11 DIAGNOSIS — J449 Chronic obstructive pulmonary disease, unspecified: Secondary | ICD-10-CM | POA: Diagnosis not present

## 2022-01-11 DIAGNOSIS — M1711 Unilateral primary osteoarthritis, right knee: Secondary | ICD-10-CM | POA: Diagnosis not present

## 2022-01-11 DIAGNOSIS — Z23 Encounter for immunization: Secondary | ICD-10-CM | POA: Diagnosis not present

## 2022-01-11 DIAGNOSIS — H9190 Unspecified hearing loss, unspecified ear: Secondary | ICD-10-CM | POA: Diagnosis not present

## 2022-01-12 ENCOUNTER — Ambulatory Visit (HOSPITAL_BASED_OUTPATIENT_CLINIC_OR_DEPARTMENT_OTHER)
Admission: RE | Admit: 2022-01-12 | Discharge: 2022-01-12 | Disposition: A | Discharge status: Home / Self Care | Payer: Medicare PPO | Source: Ambulatory Visit | Attending: Primary Care | Admitting: Primary Care

## 2022-01-12 DIAGNOSIS — I2510 Atherosclerotic heart disease of native coronary artery without angina pectoris: Secondary | ICD-10-CM | POA: Diagnosis not present

## 2022-01-12 DIAGNOSIS — I700 Atherosclerosis of aorta: Secondary | ICD-10-CM | POA: Diagnosis not present

## 2022-01-12 DIAGNOSIS — R918 Other nonspecific abnormal finding of lung field: Secondary | ICD-10-CM | POA: Diagnosis not present

## 2022-01-12 DIAGNOSIS — J439 Emphysema, unspecified: Secondary | ICD-10-CM | POA: Diagnosis not present

## 2022-01-12 DIAGNOSIS — R911 Solitary pulmonary nodule: Secondary | ICD-10-CM | POA: Diagnosis not present

## 2022-01-15 NOTE — Telephone Encounter (Signed)
Left message for him to call us back.

## 2022-01-16 ENCOUNTER — Telehealth: Payer: Self-pay | Admitting: Interventional Cardiology

## 2022-01-16 NOTE — Telephone Encounter (Signed)
Pt c/o medication issue:  1. Name of Medication:   ELIQUIS 5 MG TABS tablet  2. How are you currently taking this medication (dosage and times per day)?   As prescribed  3. Are you having a reaction (difficulty breathing--STAT)?   No  4. What is your medication issue?   Patient stated he is almost out of this medication.  Patient stated he would like to get two weeks worth of samples of this medication and would like to get paperwork to file for assistance to get Eliquis.

## 2022-01-16 NOTE — Progress Notes (Signed)
Please let patient know CT chest showed left lower nodule of concern has resolved distant with either inflammatory or infectious etiology.  Small pulmonary nodules in right lung have also resolved.  New pulmonary nodules.  Mild emphysema.   Additionally he had some aortic arthrosclerosis and coronary calcifications of his artery.  He should make sure that he follows up with either primary care or cardiology regarding these nonurgent findings.   Patient has an appointment October 25 with Dr. Erin Fulling which I recommend he keep

## 2022-01-17 DIAGNOSIS — H9222 Otorrhagia, left ear: Secondary | ICD-10-CM | POA: Diagnosis not present

## 2022-01-17 NOTE — Telephone Encounter (Signed)
**Note De-Identified  Obfuscation** No answer and no way to leave a VM. Will continue to call. We have left the pt a BMSPAF application and 2 weeks of Eliquis 5 mg samples in the front office for him to pick up.

## 2022-01-18 NOTE — Telephone Encounter (Signed)
**Note De-Identified  Obfuscation** The pts DPR, [name] states that they picked the pts Eliquis samples and the BMSPAF application up from our office yesterday.  She is aware to complete the pts application, to obtain his required documents per BMSPAF, and to bring all to Dr Darliss Ridgel office to drop off ASAP. She is aware that we will take care of the providers page of the pts application and will fax all to [employer]MS[employer].  She thanked me for calling them back.

## 2022-01-22 ENCOUNTER — Telehealth: Payer: Self-pay

## 2022-01-22 NOTE — Telephone Encounter (Signed)
Patient assistance application signed and faxed. 

## 2022-01-22 NOTE — Telephone Encounter (Signed)
**Note De-Identified  Obfuscation** The pts completed BMSPAF application for Eliquis was left at the office with documents.  I have completed the providers page of his application and have e-mailed all to Dr Thompson Caul nurse so she can obtain his signature, date it, and to fax to [facility] at the fax number written on the cover letter included.

## 2022-01-25 NOTE — Telephone Encounter (Signed)
**Note De-Identified  Obfuscation** Letter received from [facility] stating that they denied the pt Eliquis asst. Reason: Documentation of 3% out of pocket RX expenses, based on household adjusted gross income, not met.  The letter states that they have notified the pt of this as well.

## 2022-02-01 DIAGNOSIS — H9222 Otorrhagia, left ear: Secondary | ICD-10-CM | POA: Diagnosis not present

## 2022-02-07 ENCOUNTER — Ambulatory Visit: Payer: Medicare PPO | Admitting: Pulmonary Disease

## 2022-02-09 ENCOUNTER — Ambulatory Visit: Payer: Medicare PPO | Admitting: Acute Care

## 2022-02-15 ENCOUNTER — Telehealth: Payer: Self-pay | Admitting: Interventional Cardiology

## 2022-02-15 NOTE — Telephone Encounter (Signed)
Hey Lynn, LPN, can you please advise on this matter? Thanks  ?

## 2022-02-15 NOTE — Telephone Encounter (Signed)
**Note De-Identified  Obfuscation** The pt is advised that we received a letter from [facility] on 10/12 advising [place] that they denied him Eliquis assistance due to his 3% out of pocket RX expenses, based on household adjusted gross income, not met and that the letter stated that they had notified him as well.  He stated that *there is no way that I have not met that 3% requirement for my medicines".  I gave him [facility] phone number and advised him to call them to ask why he was denied. He is aware that if they need his out of pocket RX expense report that he can bring it to the office ASAP and that we will fax it to [facility] for him.  He is also aware that we are leaving him 2 weeks of Eliquis 5 mg samples in the front office for him to pick up.  He verbalized understanding and thanked me for our assistance.

## 2022-02-15 NOTE — Telephone Encounter (Signed)
Ok will do, thanks!

## 2022-02-15 NOTE — Telephone Encounter (Signed)
Patient calling the office for samples of medication:   1.  What medication and dosage are you requesting samples for? ELIQUIS 5 MG TABS tablet   2.  Are you currently out of this medication? Yes out of medication

## 2022-02-16 ENCOUNTER — Other Ambulatory Visit: Payer: Self-pay

## 2022-02-16 DIAGNOSIS — I484 Atypical atrial flutter: Secondary | ICD-10-CM

## 2022-02-16 MED ORDER — APIXABAN 5 MG PO TABS: 5.0000 mg | ORAL_TABLET | Freq: Two times a day (BID) | ORAL | 1 refills | Status: DC

## 2022-02-16 NOTE — Telephone Encounter (Signed)
Prescription refill request for Eliquis received. Indication: Aflutter Last office visit: 11/17/21 Tamala Julian)  Scr: 1.26 (10/29/21)  Age: 81 Weight: 100.7kg  Appropriate dose and refill sent to requested pharmacy.

## 2022-02-19 DIAGNOSIS — E7800 Pure hypercholesterolemia, unspecified: Secondary | ICD-10-CM | POA: Diagnosis not present

## 2022-02-19 DIAGNOSIS — E876 Hypokalemia: Secondary | ICD-10-CM | POA: Diagnosis not present

## 2022-02-19 DIAGNOSIS — M109 Gout, unspecified: Secondary | ICD-10-CM | POA: Diagnosis not present

## 2022-02-19 DIAGNOSIS — E669 Obesity, unspecified: Secondary | ICD-10-CM | POA: Diagnosis not present

## 2022-02-19 DIAGNOSIS — Z0000 Encounter for general adult medical examination without abnormal findings: Secondary | ICD-10-CM | POA: Diagnosis not present

## 2022-02-19 DIAGNOSIS — N183 Chronic kidney disease, stage 3 unspecified: Secondary | ICD-10-CM | POA: Diagnosis not present

## 2022-02-19 DIAGNOSIS — J449 Chronic obstructive pulmonary disease, unspecified: Secondary | ICD-10-CM | POA: Diagnosis not present

## 2022-02-19 DIAGNOSIS — I10 Essential (primary) hypertension: Secondary | ICD-10-CM | POA: Diagnosis not present

## 2022-02-19 DIAGNOSIS — R079 Chest pain, unspecified: Secondary | ICD-10-CM | POA: Diagnosis not present

## 2022-02-21 ENCOUNTER — Encounter: Payer: Self-pay | Admitting: Pulmonary Disease

## 2022-02-21 ENCOUNTER — Ambulatory Visit (INDEPENDENT_AMBULATORY_CARE_PROVIDER_SITE_OTHER): Payer: Medicare PPO | Admitting: Pulmonary Disease

## 2022-02-21 VITALS — BP 122/68 | HR 56 | Ht 69.0 in | Wt 221.8 lb

## 2022-02-21 DIAGNOSIS — J9612 Chronic respiratory failure with hypercapnia: Secondary | ICD-10-CM | POA: Diagnosis not present

## 2022-02-21 DIAGNOSIS — J9611 Chronic respiratory failure with hypoxia: Secondary | ICD-10-CM

## 2022-02-21 MED ORDER — BREZTRI AEROSPHERE 160-9-4.8 MCG/ACT IN AERO: 2.0000 | INHALATION_SPRAY | Freq: Two times a day (BID) | RESPIRATORY_TRACT | 11 refills | Status: DC

## 2022-02-21 MED ORDER — PREDNISONE 10 MG PO TABS: ORAL_TABLET | ORAL | 0 refills | Status: AC

## 2022-02-21 MED ORDER — IPRATROPIUM-ALBUTEROL 0.5-2.5 (3) MG/3ML IN SOLN: 3.0000 mL | Freq: Four times a day (QID) | RESPIRATORY_TRACT | 11 refills | Status: DC | PRN

## 2022-02-21 MED ORDER — AZITHROMYCIN 250 MG PO TABS: ORAL_TABLET | ORAL | 0 refills | Status: DC

## 2022-02-21 NOTE — Patient Instructions (Addendum)
Continue breztri inhaler 2 puffs twice daily - rinse mouth out after each use  Start prednisone taper: 40mg  daily x 3 days 30mg  daily x 3 days 20mg  daily x 3 days Then 10mg  daily there after  Start azithromycin daily for 5 days  Continue duoneb nebulizer treatments every 6 hours as needed  Continue albuterol inhaler every 6 hours as needed  Follow up in 2 months

## 2022-02-21 NOTE — Progress Notes (Signed)
Synopsis: Referred in September 2022 for COPD by Evangeline Gula, PA  Subjective:   PATIENT ID: [name] GENDER: male DOB: 1940/09/24, MRN: [id number]  HPI  Chief Complaint  Patient presents with   Follow-up    F/U on COPD. States he has more SOB lately. Productive cough with dark phlegm.    Samad Thon is an 81 year old male, former smoker with CAD, CKD stage III, GERD, hypertension, chronic hypoxemic respiratory failure due to emphysema who returns to pulmonary clinic for COPD.  CT Chest 01/12/22 shows resolution of pulmonary nodules in question from prior scan.   He has been doing ok since last visit on breztri 2 puffs twice daily and PRN albuterol nebulizer treatments about 3 times per day. He reports having a harder time breathing over the last week with increased cough/sputum production.   He is very limited in his functional abilities and is need more help at home.   OV 11/16/21 He reports increased shortness of breath since yesterday. He feels he is struggling to breath at this time.   He was treated in ER 10/29/21 for COPD exacerbation.   OV 08/04/21 He was seen by Buelah Manis, NP on 05/19/21 for hospital follow up after being admitted for COPD exacerbation. His trelegy was increased to 200 at last visit. A new 11-62mm RUL nodule was noted on CTA on 05/02/21. Repeat CT Chest 08/01/21 showed mostly resolved RUL nodule. New irregular nodule of the left lower lobe abutting the posterior pleural margin.  He reports he feels he is getting worse. He is having a lot of trouble breathing. He had covid 2 months ago.   Using nebulizer machine 2-3 times per day. Using 2-3L of oxygen day/night. He has been out of trelegy ellipta 200 over the past 2 weeks.   OV 01/13/2021 He had an ER visit 12/13/20 for dyspnea and was treated for COPD exacerbation. He has been doing ok since last month but continues to have bad days with his breathing throughout the week with increased cough, wheezing and  dyspnea.   He is using dulera 100-21mcg 2 puffs twice daily and as needed albuterol throughout the day. He has limited eyesight and reports some difficulty using his inhalers.   He is a former smoker, smoked a pipe from [place] to 2013. He also reports a significant amount of second hand smoke throughout his life. He worked as a Naval architect.   He is on 2.5L of supplemental oxygen at home which he uses throughout the day and night.    Past Medical History:  Diagnosis Date   Anginal pain (HCC) 01/2012   Anxiety    Atherosclerotic heart disease of native coronary artery without angina pectoris    BPH (benign prostatic hypertrophy)    CAD (coronary artery disease) 2007   with LAD/Diag CBPTCA and kissing    CKD (chronic kidney disease), stage III (HCC)    Hattie Perch 01/04/2017   COPD (chronic obstructive pulmonary disease) (HCC)    "maybe" (02/14/2012)   Coronary artery disease    Depression    GERD (gastroesophageal reflux disease)    Hyperlipidemia    Hypertension    Hypoxemia    Myocardial infarction (HCC) 01/22/2012   NSTEMI with DES to RSA (PDA)   On home oxygen therapy    Shortness of breath    "walking and laying down sometimes" (02/14/2012)     Family History  Problem Relation Age of Onset   Heart failure Mother  Heart failure Brother      Social History   Socioeconomic History   Marital status: Single    Spouse name: Not on file   Number of children: 3   Years of education: Not on file   Highest education level: Not on file  Occupational History   Not on file  Tobacco Use   Smoking status: Former    Types: Pipe    Quit date: 04/28/2011    Years since quitting: 10.8   Smokeless tobacco: Never  Vaping Use   Vaping Use: Never used  Substance and Sexual Activity   Alcohol use: Not Currently    Alcohol/week: 7.0 standard drinks of alcohol    Types: 7 Standard drinks or equivalent per week    Comment: 02/14/2012 "weekends I have 6-7 mixed drinks"     occasional    Drug use: No   Sexual activity: Yes  Other Topics Concern   Not on file  Social History Narrative   Truck driver      Social Determinants of Health   Financial Resource Strain: Not on file  Food Insecurity: Not on file  Transportation Needs: No Transportation Needs (08/24/2021)   PRAPARE - Administrator, Civil Service (Medical): No    Lack of Transportation (Non-Medical): No  Physical Activity: Not on file  Stress: Not on file  Social Connections: Not on file  Intimate Partner Violence: Not on file     Allergies  Allergen Reactions   Lipitor [Atorvastatin] Other (See Comments)    "just paralyzed me; I can't take it"   Hydrocodone-Acetaminophen Hives   Rosuvastatin     Leg aches     Outpatient Medications Prior to Visit  Medication Sig Dispense Refill   acetaminophen (TYLENOL) 500 MG tablet Take 500-1,000 mg by mouth daily as needed for headache (for arthritis pain).     albuterol (PROVENTIL HFA;VENTOLIN HFA) 108 (90 BASE) MCG/ACT inhaler Inhale 2 puffs into the lungs every 6 (six) hours as needed for wheezing. Reported on 05/04/2015     allopurinol (ZYLOPRIM) 100 MG tablet Take 100 mg by mouth daily.     apixaban (ELIQUIS) 5 MG TABS tablet Take 1 tablet (5 mg total) by mouth 2 (two) times daily. 180 tablet 1   finasteride (PROSCAR) 5 MG tablet Take 5 mg by mouth daily. Reported on 05/04/2015     guaiFENesin (MUCINEX) 600 MG 12 hr tablet Take 1 tablet (600 mg total) by mouth 2 (two) times daily. (Patient taking differently: Take 300 mg by mouth 2 (two) times daily.) 60 tablet 1   isosorbide mononitrate (IMDUR) 30 MG 24 hr tablet Take 1 tablet (30 mg total) by mouth every evening. Take after 5pm. 90 tablet 3   metoprolol tartrate (LOPRESSOR) 25 MG tablet TAKE 1 TABLET TWICE DAILY (Patient taking differently: Take 25 mg by mouth 2 (two) times daily.) 180 tablet 1   nitroGLYCERIN (NITROSTAT) 0.4 MG SL tablet Place 1 tablet (0.4 mg total) under the tongue every 5 (five)  minutes as needed. Reported on 05/04/2015 25 tablet 3   OXYGEN Inhale 3 L into the lungs continuous as needed (for diffiulty breathing).     pravastatin (PRAVACHOL) 20 MG tablet Take 1 tablet (20 mg total) by mouth every evening. 90 tablet 3   albuterol (PROVENTIL) (2.5 MG/3ML) 0.083% nebulizer solution Take 3 mLs (2.5 mg total) by nebulization every 6 (six) hours as needed for wheezing or shortness of breath. 75 mL 12   Budeson-Glycopyrrol-Formoterol (BREZTRI  AEROSPHERE) 160-9-4.8 MCG/ACT AERO Inhale 2 puffs into the lungs in the morning and at bedtime.     azithromycin (ZITHROMAX) 250 MG tablet Take as directed 6 tablet 0   diclofenac (FLECTOR) 1.3 % PTCH Place 1 patch onto the skin 2 (two) times daily. If not affordable, pls substitute OTC Voltaren Gel or cream--thanks 20 patch 0   predniSONE (DELTASONE) 10 MG tablet Take 1 tablet (10 mg total) by mouth daily with breakfast. 30 tablet 3   No facility-administered medications prior to visit.   Review of Systems  Constitutional:  Negative for chills, fever, malaise/fatigue and weight loss.  HENT:  Negative for congestion, sinus pain and sore throat.   Eyes: Negative.   Respiratory:  Positive for cough, sputum production, shortness of breath and wheezing. Negative for hemoptysis.   Cardiovascular:  Negative for chest pain, palpitations, orthopnea, claudication and leg swelling.  Gastrointestinal:  Negative for abdominal pain, heartburn, nausea and vomiting.  Genitourinary: Negative.   Musculoskeletal:  Negative for joint pain and myalgias.  Skin:  Negative for rash.  Neurological:  Negative for weakness.  Endo/Heme/Allergies: Negative.   Psychiatric/Behavioral: Negative.      Objective:   Vitals:   02/21/22 0838  BP: 122/68  Pulse: (!) 56  SpO2: 95%  Weight: 221 lb 12.8 oz (100.6 kg)  Height: 5\' 9"  (1.753 m)   Physical Exam Constitutional:      General: He is not in acute distress. HENT:     Head: Normocephalic and atraumatic.   Eyes:     Conjunctiva/sclera: Conjunctivae normal.  Cardiovascular:     Rate and Rhythm: Normal rate and regular rhythm.     Pulses: Normal pulses.     Heart sounds: Normal heart sounds. No murmur heard. Pulmonary:     Effort: No respiratory distress.     Breath sounds: Decreased air movement present. Decreased breath sounds present. No wheezing or rhonchi.  Musculoskeletal:     Right lower leg: No edema.     Left lower leg: No edema.  Skin:    General: Skin is warm and dry.  Neurological:     General: No focal deficit present.     Mental Status: He is alert.  Psychiatric:        Mood and Affect: Mood normal.        Behavior: Behavior normal.        Thought Content: Thought content normal.        Judgment: Judgment normal.     CBC    Component Value Date/Time   WBC 7.6 10/29/2021 1237   RBC 5.29 10/29/2021 1237   HGB 15.3 10/29/2021 1237   HGB 15.9 01/30/2021 0000   HCT 47.3 10/29/2021 1237   HCT 46.1 01/30/2021 0000   PLT 166 10/29/2021 1237   PLT 176 01/30/2021 0000   MCV 89.4 10/29/2021 1237   MCV 87 01/30/2021 0000   MCH 28.9 10/29/2021 1237   MCHC 32.3 10/29/2021 1237   RDW 14.7 10/29/2021 1237   RDW 12.9 01/30/2021 0000   LYMPHSABS 0.9 09/07/2021 1148   MONOABS 0.8 09/07/2021 1148   EOSABS 0.3 09/07/2021 1148   BASOSABS 0.1 09/07/2021 1148   Chest imaging: CT Chest 01/12/22 1. The left lower lobe nodule of concern that was new on prior exam has resolved in the interim. No residual nodularity or airspace disease. This is consistent with infectious or inflammatory process, and needs no specific imaging follow-up. 2. The 3-4 mm nodules in the superior segment of  the right lower lobe on prior exam have resolved in the interim, also consistent with infectious or inflammatory process needing no further follow-up. 3. No new pulmonary nodules or nodules of concern. No pulmonary nodule follow-up is needed. 4. Mild emphysema and bronchial thickening. 5. Aortic  atherosclerosis and coronary artery calcifications.  CXR 10/29/21 No active cardiopulmonary disease  CT Chest 08/01/21 1. Nodule noted in the right upper lobe on the prior CT has mostly resolved consistent near complete resolution of an inflammatory nodule, excluding malignancy. 2. New irregular nodule, abutting the posterior pleural margin, left lower lobe, 9 mm. Additional 3-4 mm new nodules in the superior segment of the right lower lobe. New tiny ill-defined irregular opacity in the right upper lobe image 39, series 7. 3. Overall findings are consistent with inflammation, with waxing and waning nodules.   CXR 12/13/20 The heart and mediastinal contours are unchanged. Aortic calcification. No focal consolidation. No pulmonary edema. No pleural effusion. No pneumothorax. No acute osseous abnormality.  CT Chest 2018 Hyperinflation of the lungs consistent with COPD.   Minimal peribronchial airspace opacities in bilateral lower lobes may represent atelectasis versus peribronchial airspace consolidation.   PFT:     No data to display         2016: FEV1/FVC 58, FEV1 38%, FVC 50%  Echo 12/2016: LVEF 55-60%. Grade I diastolic dysfunction. RV size and function is normal.   Assessment & Plan:   Chronic respiratory failure with hypoxia and hypercapnia (HCC) - Plan: ipratropium-albuterol (DUONEB) 0.5-2.5 (3) MG/3ML SOLN, Budeson-Glycopyrrol-Formoterol (BREZTRI AEROSPHERE) 160-9-4.8 MCG/ACT AERO, predniSONE (DELTASONE) 10 MG tablet, azithromycin (ZITHROMAX) 250 MG tablet  Discussion: Devyn Sheerin is an 81 year old male, former smoker with CAD, CKD stage III, GERD, hypertension, chronic hypoxemic respiratory failure due to emphysema who returns to pulmonary clinic for COPD.  He has acute COPD exacerbation. We will treat him with prednisone taper and Zpak. He is to continue on 10mg  of prednisone daily for palliative measures for his COPD.   He is to continue breztri inhaler  therapy and as needed duoneb nebulizer treatments.   He has chronic hypercapnic and hypoxemic respiratory failure. I believe he would benefit from CPAP or Bipap therapy. He has serum bicarb level ranging 32 to 36 in the past. Sleep study was negative for sleep apnea. After long discussion he does not feel he would be able to tolerate wearing a mask at night to sleep so we will not order him a machine.  His understanding of using inhalers is hindered by his poor eye sight and being hard of hearing. His ex-wife has accompanied him and helps as best she can. I have recommended that he look into an assisted living facility for his health and safety.  He is to continue supplemental oxygen for respiratory failure.   Follow-up in 2 months.  , MD [facility] Office: [phone]   Current Outpatient Medications:    acetaminophen (TYLENOL) 500 MG tablet, Take 500-1,000 mg by mouth daily as needed for headache (for arthritis pain)., Disp: , Rfl:    albuterol (PROVENTIL HFA;VENTOLIN HFA) 108 (90 BASE) MCG/ACT inhaler, Inhale 2 puffs into the lungs every 6 (six) hours as needed for wheezing. Reported on 05/04/2015, Disp: , Rfl:    allopurinol (ZYLOPRIM) 100 MG tablet, Take 100 mg by mouth daily., Disp: , Rfl:    apixaban (ELIQUIS) 5 MG TABS tablet, Take 1 tablet (5 mg total) by mouth 2 (two) times daily., Disp: 180 tablet, Rfl: 1  azithromycin (ZITHROMAX) 250 MG tablet, Take as directed, Disp: 6 tablet, Rfl: 0   finasteride (PROSCAR) 5 MG tablet, Take 5 mg by mouth daily. Reported on 05/04/2015, Disp: , Rfl:    guaiFENesin (MUCINEX) 600 MG 12 hr tablet, Take 1 tablet (600 mg total) by mouth 2 (two) times daily. (Patient taking differently: Take 300 mg by mouth 2 (two) times daily.), Disp: 60 tablet, Rfl: 1   ipratropium-albuterol (DUONEB) 0.5-2.5 (3) MG/3ML SOLN, Take 3 mLs by nebulization every 6 (six) hours as needed., Disp: 360 mL, Rfl: 11   isosorbide mononitrate  (IMDUR) 30 MG 24 hr tablet, Take 1 tablet (30 mg total) by mouth every evening. Take after 5pm., Disp: 90 tablet, Rfl: 3   metoprolol tartrate (LOPRESSOR) 25 MG tablet, TAKE 1 TABLET TWICE DAILY (Patient taking differently: Take 25 mg by mouth 2 (two) times daily.), Disp: 180 tablet, Rfl: 1   nitroGLYCERIN (NITROSTAT) 0.4 MG SL tablet, Place 1 tablet (0.4 mg total) under the tongue every 5 (five) minutes as needed. Reported on 05/04/2015, Disp: 25 tablet, Rfl: 3   OXYGEN, Inhale 3 L into the lungs continuous as needed (for diffiulty breathing)., Disp: , Rfl:    pravastatin (PRAVACHOL) 20 MG tablet, Take 1 tablet (20 mg total) by mouth every evening., Disp: 90 tablet, Rfl: 3   predniSONE (DELTASONE) 10 MG tablet, Take 4 tablets (40 mg total) by mouth daily with breakfast for 3 days, THEN 3 tablets (30 mg total) daily with breakfast for 3 days, THEN 2 tablets (20 mg total) daily with breakfast for 3 days, THEN 1 tablet (10 mg total) daily with breakfast for 3 days., Disp: 30 tablet, Rfl: 0   Budeson-Glycopyrrol-Formoterol (BREZTRI AEROSPHERE) 160-9-4.8 MCG/ACT AERO, Inhale 2 puffs into the lungs in the morning and at bedtime., Disp: 10.7 g, Rfl: 11

## 2022-02-27 DIAGNOSIS — H6122 Impacted cerumen, left ear: Secondary | ICD-10-CM | POA: Diagnosis not present

## 2022-03-24 ENCOUNTER — Inpatient Hospital Stay (HOSPITAL_BASED_OUTPATIENT_CLINIC_OR_DEPARTMENT_OTHER)
Admission: EM | Admit: 2022-03-24 | Discharge: 2022-03-29 | DRG: 191 | Disposition: A | Discharge status: Home Health | Payer: Medicare PPO | Attending: Internal Medicine | Admitting: Internal Medicine

## 2022-03-24 ENCOUNTER — Encounter (HOSPITAL_BASED_OUTPATIENT_CLINIC_OR_DEPARTMENT_OTHER): Payer: Self-pay | Admitting: Emergency Medicine

## 2022-03-24 ENCOUNTER — Emergency Department (HOSPITAL_BASED_OUTPATIENT_CLINIC_OR_DEPARTMENT_OTHER): Payer: Medicare PPO | Admitting: Radiology

## 2022-03-24 ENCOUNTER — Other Ambulatory Visit: Payer: Self-pay

## 2022-03-24 DIAGNOSIS — I129 Hypertensive chronic kidney disease with stage 1 through stage 4 chronic kidney disease, or unspecified chronic kidney disease: Secondary | ICD-10-CM | POA: Diagnosis present

## 2022-03-24 DIAGNOSIS — Z1152 Encounter for screening for COVID-19: Secondary | ICD-10-CM | POA: Diagnosis not present

## 2022-03-24 DIAGNOSIS — Z888 Allergy status to other drugs, medicaments and biological substances status: Secondary | ICD-10-CM | POA: Diagnosis not present

## 2022-03-24 DIAGNOSIS — N400 Enlarged prostate without lower urinary tract symptoms: Secondary | ICD-10-CM | POA: Diagnosis present

## 2022-03-24 DIAGNOSIS — I252 Old myocardial infarction: Secondary | ICD-10-CM | POA: Diagnosis not present

## 2022-03-24 DIAGNOSIS — R053 Chronic cough: Secondary | ICD-10-CM | POA: Diagnosis not present

## 2022-03-24 DIAGNOSIS — J9611 Chronic respiratory failure with hypoxia: Secondary | ICD-10-CM

## 2022-03-24 DIAGNOSIS — Z7901 Long term (current) use of anticoagulants: Secondary | ICD-10-CM | POA: Diagnosis not present

## 2022-03-24 DIAGNOSIS — I482 Chronic atrial fibrillation, unspecified: Secondary | ICD-10-CM | POA: Diagnosis present

## 2022-03-24 DIAGNOSIS — J069 Acute upper respiratory infection, unspecified: Secondary | ICD-10-CM | POA: Diagnosis present

## 2022-03-24 DIAGNOSIS — Z885 Allergy status to narcotic agent status: Secondary | ICD-10-CM | POA: Diagnosis not present

## 2022-03-24 DIAGNOSIS — F32A Depression, unspecified: Secondary | ICD-10-CM | POA: Diagnosis present

## 2022-03-24 DIAGNOSIS — Z79899 Other long term (current) drug therapy: Secondary | ICD-10-CM | POA: Diagnosis not present

## 2022-03-24 DIAGNOSIS — N1831 Chronic kidney disease, stage 3a: Secondary | ICD-10-CM | POA: Diagnosis present

## 2022-03-24 DIAGNOSIS — N189 Chronic kidney disease, unspecified: Secondary | ICD-10-CM | POA: Diagnosis present

## 2022-03-24 DIAGNOSIS — Z87891 Personal history of nicotine dependence: Secondary | ICD-10-CM | POA: Diagnosis not present

## 2022-03-24 DIAGNOSIS — J9610 Chronic respiratory failure, unspecified whether with hypoxia or hypercapnia: Secondary | ICD-10-CM | POA: Diagnosis present

## 2022-03-24 DIAGNOSIS — Z8249 Family history of ischemic heart disease and other diseases of the circulatory system: Secondary | ICD-10-CM | POA: Diagnosis not present

## 2022-03-24 DIAGNOSIS — I25119 Atherosclerotic heart disease of native coronary artery with unspecified angina pectoris: Secondary | ICD-10-CM | POA: Diagnosis present

## 2022-03-24 DIAGNOSIS — E785 Hyperlipidemia, unspecified: Secondary | ICD-10-CM | POA: Diagnosis present

## 2022-03-24 DIAGNOSIS — Z955 Presence of coronary angioplasty implant and graft: Secondary | ICD-10-CM

## 2022-03-24 DIAGNOSIS — J441 Chronic obstructive pulmonary disease with (acute) exacerbation: Principal | ICD-10-CM | POA: Diagnosis present

## 2022-03-24 DIAGNOSIS — Z9981 Dependence on supplemental oxygen: Secondary | ICD-10-CM

## 2022-03-24 DIAGNOSIS — Z7951 Long term (current) use of inhaled steroids: Secondary | ICD-10-CM

## 2022-03-24 DIAGNOSIS — N179 Acute kidney failure, unspecified: Secondary | ICD-10-CM | POA: Diagnosis present

## 2022-03-24 DIAGNOSIS — K219 Gastro-esophageal reflux disease without esophagitis: Secondary | ICD-10-CM | POA: Diagnosis present

## 2022-03-24 DIAGNOSIS — R0602 Shortness of breath: Secondary | ICD-10-CM | POA: Diagnosis not present

## 2022-03-24 DIAGNOSIS — I2510 Atherosclerotic heart disease of native coronary artery without angina pectoris: Secondary | ICD-10-CM | POA: Diagnosis present

## 2022-03-24 DIAGNOSIS — E782 Mixed hyperlipidemia: Secondary | ICD-10-CM | POA: Diagnosis present

## 2022-03-24 DIAGNOSIS — I10 Essential (primary) hypertension: Secondary | ICD-10-CM | POA: Diagnosis not present

## 2022-03-24 DIAGNOSIS — Z8659 Personal history of other mental and behavioral disorders: Secondary | ICD-10-CM

## 2022-03-24 LAB — BASIC METABOLIC PANEL
Anion gap: 7 (ref 5–15)
BUN: 23 mg/dL (ref 8–23)
CO2: 34 mmol/L — ABNORMAL HIGH (ref 22–32)
Calcium: 9.1 mg/dL (ref 8.9–10.3)
Chloride: 98 mmol/L (ref 98–111)
Creatinine, Ser: 1.67 mg/dL — ABNORMAL HIGH (ref 0.61–1.24)
GFR, Estimated: 41 mL/min — ABNORMAL LOW (ref >60)
Glucose, Bld: 104 mg/dL — ABNORMAL HIGH (ref 70–99)
Potassium: 4.1 mmol/L (ref 3.5–5.1)
Sodium: 139 mmol/L (ref 135–145)

## 2022-03-24 LAB — CBC WITH DIFFERENTIAL/PLATELET
Abs Immature Granulocytes: 0.02 10*3/uL (ref 0.00–0.07)
Basophils Absolute: 0.1 10*3/uL (ref 0.0–0.1)
Basophils Relative: 1 %
Eosinophils Absolute: 0.5 10*3/uL (ref 0.0–0.5)
Eosinophils Relative: 6 %
HCT: 48.2 % (ref 39.0–52.0)
Hemoglobin: 15.4 g/dL (ref 13.0–17.0)
Immature Granulocytes: 0 %
Lymphocytes Relative: 14 %
Lymphs Abs: 1.3 10*3/uL (ref 0.7–4.0)
MCH: 28.6 pg (ref 26.0–34.0)
MCHC: 32 g/dL (ref 30.0–36.0)
MCV: 89.6 fL (ref 80.0–100.0)
Monocytes Absolute: 1.1 10*3/uL — ABNORMAL HIGH (ref 0.1–1.0)
Monocytes Relative: 12 %
Neutro Abs: 6 10*3/uL (ref 1.7–7.7)
Neutrophils Relative %: 67 %
Platelets: 203 10*3/uL (ref 150–400)
RBC: 5.38 MIL/uL (ref 4.22–5.81)
RDW: 14.6 % (ref 11.5–15.5)
WBC: 8.9 10*3/uL (ref 4.0–10.5)
nRBC: 0 % (ref 0.0–0.2)

## 2022-03-24 LAB — TROPONIN I (HIGH SENSITIVITY): Troponin I (High Sensitivity): 3 ng/L (ref <18)

## 2022-03-24 LAB — BRAIN NATRIURETIC PEPTIDE: B Natriuretic Peptide: 24 pg/mL (ref 0.0–100.0)

## 2022-03-24 LAB — RESP PANEL BY RT-PCR (RSV, FLU A&B, COVID)  RVPGX2
Influenza A by PCR: NEGATIVE
Influenza B by PCR: NEGATIVE
Resp Syncytial Virus by PCR: NEGATIVE
SARS Coronavirus 2 by RT PCR: NEGATIVE

## 2022-03-24 MED ORDER — IPRATROPIUM-ALBUTEROL 0.5-2.5 (3) MG/3ML IN SOLN
RESPIRATORY_TRACT | Status: AC
  Administered 2022-03-24: 3 mL
  Filled 2022-03-24: qty 3

## 2022-03-24 MED ORDER — AEROCHAMBER PLUS FLO-VU LARGE MISC
1.0000 | Freq: Once | Status: AC
  Administered 2022-03-24: 1
  Filled 2022-03-24: qty 1

## 2022-03-24 MED ORDER — METHYLPREDNISOLONE SODIUM SUCC 125 MG IJ SOLR
125.0000 mg | Freq: Once | INTRAMUSCULAR | Status: AC
  Administered 2022-03-24: 125 mg via INTRAVENOUS
  Filled 2022-03-24: qty 2

## 2022-03-24 MED ORDER — ALBUTEROL SULFATE HFA 108 (90 BASE) MCG/ACT IN AERS
2.0000 | INHALATION_SPRAY | RESPIRATORY_TRACT | Status: DC | PRN
  Administered 2022-03-24: 2 via RESPIRATORY_TRACT
  Filled 2022-03-24: qty 6.7

## 2022-03-24 MED ORDER — IPRATROPIUM-ALBUTEROL 0.5-2.5 (3) MG/3ML IN SOLN: 3.0000 mL | RESPIRATORY_TRACT | Status: AC

## 2022-03-24 NOTE — ED Triage Notes (Signed)
Pt presents to ED Pov. Pt c/o productive cough and "his lungs hurting". Pt reports that s/s started 2w ago but worsened the last 3-4 days. Significant lung history. Reports neb x3-4. Pt reports PRN oxygen at home but he has had to consistently use oxygen today. On 2.5L [place] from home oxygen.

## 2022-03-24 NOTE — ED Notes (Signed)
RT now at bedside providing education on breathing tx/inhaler with aerochamber

## 2022-03-24 NOTE — ED Provider Notes (Signed)
MEDCENTER [facility] EMERGENCY DEPT Provider Note   CSN: [id number] Arrival date & time: 03/24/22  1919     History  Chief Complaint  Patient presents with   Cough    Mitchell [name] is a 81 y.o. male.   Cough Associated symptoms: chest pain and shortness of breath      81 year old male with medical history significant for CAD, HTN, HLD, GERD, COPD on 2 to 3 L O2 chronically, CKD stage III who presents to the emergency department with cough and shortness of breath.  The patient states that "I have been going downhill for the past 2 weeks."  He states that he has had shortness of breath for the last 2 weeks and developed URI symptoms during that time with upper respiratory congestion.  Over the last 3 days he has developed a worsening productive cough and sharp chest pain.  He endorses some bilateral lower extremity swelling.  He has been compliant with outpatient Lasix.  He endorses chest pressure which began today.  It feels like something is sitting on his chest.  He denies any fevers or chills.  Home Medications Prior to Admission medications   Medication Sig Start Date End Date Taking? Authorizing Provider  acetaminophen (TYLENOL) 500 MG tablet Take 500-1,000 mg by mouth daily as needed for headache (for arthritis pain).    [provider]  albuterol (PROVENTIL HFA;VENTOLIN HFA) 108 (90 BASE) MCG/ACT inhaler Inhale 2 puffs into the lungs every 6 (six) hours as needed for wheezing. Reported on 05/04/2015    [provider]  allopurinol (ZYLOPRIM) 100 MG tablet Take 100 mg by mouth daily. 11/07/20   [provider]  apixaban (ELIQUIS) 5 MG TABS tablet Take 1 tablet (5 mg total) by mouth 2 (two) times daily. 02/16/22   Lyn Records, MD  azithromycin [facility]) 250 MG tablet Take as directed 02/21/22   Martina Sinner, MD  Budeson-Glycopyrrol-Formoterol (BREZTRI AEROSPHERE) 160-9-4.8 MCG/ACT AERO Inhale 2 puffs into the lungs in the morning and at  bedtime. 02/21/22   Martina Sinner, MD  finasteride (PROSCAR) 5 MG tablet Take 5 mg by mouth daily. Reported on 05/04/2015    [provider]  guaiFENesin (MUCINEX) 600 MG 12 hr tablet Take 1 tablet (600 mg total) by mouth 2 (two) times daily. Patient taking differently: Take 300 mg by mouth 2 (two) times daily. 05/19/21   Glenford Bayley, NP  ipratropium-albuterol (DUONEB) 0.5-2.5 (3) MG/3ML SOLN Take 3 mLs by nebulization every 6 (six) hours as needed. 02/21/22   Martina Sinner, MD  isosorbide mononitrate (IMDUR) 30 MG 24 hr tablet Take 1 tablet (30 mg total) by mouth every evening. Take after 5pm. 11/17/21   Lyn Records, MD  metoprolol tartrate (LOPRESSOR) 25 MG tablet TAKE 1 TABLET TWICE DAILY Patient taking differently: Take 25 mg by mouth 2 (two) times daily. 03/22/20   Lyn Records, MD  nitroGLYCERIN (NITROSTAT) 0.4 MG SL tablet Place 1 tablet (0.4 mg total) under the tongue every 5 (five) minutes as needed. Reported on 05/04/2015 08/20/17   Leone Brand, NP  OXYGEN Inhale 3 L into the lungs continuous as needed (for diffiulty breathing).    [provider]  pravastatin (PRAVACHOL) 20 MG tablet Take 1 tablet (20 mg total) by mouth every evening. 11/17/21   Lyn Records, MD      Allergies    Lipitor [atorvastatin], Hydrocodone-acetaminophen, and Rosuvastatin    Review of Systems   Review of Systems  Respiratory:  Positive for cough and shortness of breath.   Cardiovascular:  Positive for chest pain and leg swelling.  All other systems reviewed and are negative.   Physical Exam Updated Vital Signs BP 110/65   Pulse 64   Temp 97.9 F (36.6 C) (Axillary)   Resp 14   SpO2 94%  Physical Exam Vitals and nursing note reviewed.  Constitutional:      General: He is not in acute distress.    Appearance: He is well-developed. He is ill-appearing.  HENT:     Head: Normocephalic and atraumatic.  Eyes:     Conjunctiva/sclera: Conjunctivae normal.   Cardiovascular:     Rate and Rhythm: Normal rate and regular rhythm.     Pulses: Normal pulses.  Pulmonary:     Effort: Pulmonary effort is normal. No respiratory distress.     Breath sounds: Wheezing present.     Comments: Mild wheezes, diminished breath sounds all lung fields, no rales or rhonchi.  On baseline 3 L O2 via nasal cannula Abdominal:     Palpations: Abdomen is soft.     Tenderness: There is no abdominal tenderness.  Musculoskeletal:        General: No swelling.     Cervical back: Neck supple.     Right lower leg: Edema present.     Left lower leg: Edema present.     Comments: Bilateral 1+ pitting edema  Skin:    General: Skin is warm and dry.     Capillary Refill: Capillary refill takes less than 2 seconds.  Neurological:     Mental Status: He is alert.  Psychiatric:        Mood and Affect: Mood normal.     ED Results / Procedures / Treatments   Labs (all labs ordered are listed, but only abnormal results are displayed) Labs Reviewed  CBC WITH DIFFERENTIAL/PLATELET - Abnormal; Notable for the following components:      Result Value   Monocytes Absolute 1.1 (*)    All other components within normal limits  BASIC METABOLIC PANEL - Abnormal; Notable for the following components:   CO2 34 (*)    Glucose, Bld 104 (*)    Creatinine, Ser 1.67 (*)    GFR, Estimated 41 (*)    All other components within normal limits  RESP PANEL BY RT-PCR (RSV, FLU A&B, COVID)  RVPGX2  BRAIN NATRIURETIC PEPTIDE  TROPONIN I (HIGH SENSITIVITY)  TROPONIN I (HIGH SENSITIVITY)    EKG EKG Interpretation  Date/Time:  Saturday March 24 2022 19:37:38 EST Ventricular Rate:  64 PR Interval:  190 QRS Duration: 74 QT Interval:  430 QTC Calculation: 443 R Axis:   63 Text Interpretation: Sinus rhythm with Premature atrial complexes Otherwise normal ECG When compared with ECG of 29-Oct-2021 12:08, QT has lengthened Confirmed by Ernie Avena ([id number]) on 03/24/2022 7:39:52  PM  Radiology DG Chest 2 View  Result Date: 03/24/2022 CLINICAL DATA:  SOB chronic cough EXAM: CHEST - 2 VIEW COMPARISON:  10/29/2021 FINDINGS: The heart size and mediastinal contours are within normal limits. Lungs are hyperinflated suggesting COPD. There is mild vascular congestion. There is no focal consolidation. No pneumothorax or pleural effusion. There are thoracic degenerative changes. IMPRESSION: Hyperinflated lungs consistent with COPD.  No focal consolidation. Electronically Signed   By: Layla Maw M.D.   On: 03/24/2022 20:22    Procedures Procedures    Medications Ordered in ED Medications  albuterol (VENTOLIN HFA) 108 (90 Base) MCG/ACT inhaler 2 puff (  2 puffs Inhalation Given by Other 03/24/22 2013)  ipratropium-albuterol (DUONEB) 0.5-2.5 (3) MG/3ML nebulizer solution 3 mL (has no administration in time range)  AeroChamber Plus Flo-Vu Large MISC 1 each (1 each Other Given 03/24/22 2007)  ipratropium-albuterol (DUONEB) 0.5-2.5 (3) MG/3ML nebulizer solution (3 mLs  Given 03/24/22 2006)  methylPREDNISolone sodium succinate (SOLU-MEDROL) 125 mg/2 mL injection 125 mg (125 mg Intravenous Given 03/24/22 2237)    ED Course/ Medical Decision Making/ A&P                           Medical Decision Making Amount and/or Complexity of Data Reviewed Labs: ordered. Radiology: ordered.  Risk Prescription drug management. Decision regarding hospitalization.    81 year old male with medical history significant for CAD, HTN, HLD, GERD, COPD on 2 to 3 L O2 chronically, CKD stage III who presents to the emergency department with cough and shortness of breath.  The patient states that "I have been going downhill for the past 2 weeks."  He states that he has had shortness of breath for the last 2 weeks and developed URI symptoms during that time with upper respiratory congestion.  Over the last 3 days he has developed a worsening productive cough and sharp chest pain.  He endorses some  bilateral lower extremity swelling.  He has been compliant with outpatient Lasix.  He endorses chest pressure which began today.  It feels like something is sitting on his chest.  He denies any fevers or chills.  On arrival, the patient was afebrile, tachycardic to 113, tachypneic RR 21, BP 94/67, saturating 96% on his home O2.  Sinus tachycardia noted on cardiac telemetry to normal sinus rhythm.  Patient's initial EKG revealed sinus rhythm with PACs, no STEMI, chest x-ray was performed which revealed hyperinflated lungs with no focal consolidation to suggest bacterial pneumonia.  Differential diagnosis includes viral URI, COVID-19, influenza, developing COPD exacerbation, developing bacterial pneumonia.  Additionally considered ACS/PE.  The patient is compliant with outpatient Eliquis making PE less likely.  Laboratory evaluation significant for BMP with an AKI with a creatinine of 1.67 from baseline of 1.3, CBC without leukocytosis or anemia.  CBC with a leukocytosis or anemia,, troponin 3, BNP unremarkable, COVID-19 influenza and RSV PCR testing negative.  Concern for AKI in the setting of the patient's acute illness, concern for COPD exacerbation in the setting of a URI.  No clear pneumonia on chest x-ray imaging.  The patient lives alone currently and appears debilitated.  Plan will be for hospitalist admission for observation.  Spoke with Dr. Johny Drilling who accepted the patient in admission.  Home medications were reordered.   Final Clinical Impression(s) / ED Diagnoses Final diagnoses:  AKI (acute kidney injury) (HCC)  Upper respiratory tract infection, unspecified type  COPD exacerbation [facility])    Rx / DC Orders ED Discharge Orders     None         Ernie Avena, MD 03/24/22 2356    Ernie Avena, MD 03/25/22 Aretha Parrot    Ernie Avena, MD 03/25/22 0021

## 2022-03-24 NOTE — ED Notes (Signed)
Pt son, Karsyn Rochin, located in [place], has been updated on pt status and plan for hospital admission - call then xferred into room for son to speak with pt directly.  Pt also has been provided tv dinner (meat loaf/mashed potatoes/corn and peanut butter crackers with coke per request) - meal at bedside

## 2022-03-24 NOTE — ED Notes (Signed)
RT educated pt and caregiver proper use of MDI w/spacer. Pt able to perform without difficulty. Pt respiratory status stable w/no distress noted at this time. Pt sees Pulmonary [facility] for his COPD/emphysema. Pt on chronic O2 at home 2.5 Lpm. Pt BLBS clear/diminished throughout all lung fields. RT will continue to monitor while at [facility] ED.

## 2022-03-25 ENCOUNTER — Encounter (HOSPITAL_COMMUNITY): Payer: Self-pay

## 2022-03-25 ENCOUNTER — Encounter (HOSPITAL_COMMUNITY): Payer: Self-pay | Admitting: Internal Medicine

## 2022-03-25 DIAGNOSIS — J441 Chronic obstructive pulmonary disease with (acute) exacerbation: Secondary | ICD-10-CM

## 2022-03-25 DIAGNOSIS — N1831 Chronic kidney disease, stage 3a: Secondary | ICD-10-CM | POA: Diagnosis present

## 2022-03-25 DIAGNOSIS — Z8249 Family history of ischemic heart disease and other diseases of the circulatory system: Secondary | ICD-10-CM | POA: Diagnosis not present

## 2022-03-25 DIAGNOSIS — Z885 Allergy status to narcotic agent status: Secondary | ICD-10-CM | POA: Diagnosis not present

## 2022-03-25 DIAGNOSIS — I10 Essential (primary) hypertension: Secondary | ICD-10-CM | POA: Diagnosis not present

## 2022-03-25 DIAGNOSIS — F32A Depression, unspecified: Secondary | ICD-10-CM | POA: Diagnosis present

## 2022-03-25 DIAGNOSIS — Z9981 Dependence on supplemental oxygen: Secondary | ICD-10-CM | POA: Diagnosis not present

## 2022-03-25 DIAGNOSIS — Z888 Allergy status to other drugs, medicaments and biological substances status: Secondary | ICD-10-CM | POA: Diagnosis not present

## 2022-03-25 DIAGNOSIS — Z1152 Encounter for screening for COVID-19: Secondary | ICD-10-CM | POA: Diagnosis not present

## 2022-03-25 DIAGNOSIS — N400 Enlarged prostate without lower urinary tract symptoms: Secondary | ICD-10-CM | POA: Diagnosis present

## 2022-03-25 DIAGNOSIS — E785 Hyperlipidemia, unspecified: Secondary | ICD-10-CM | POA: Diagnosis present

## 2022-03-25 DIAGNOSIS — I482 Chronic atrial fibrillation, unspecified: Secondary | ICD-10-CM | POA: Diagnosis present

## 2022-03-25 DIAGNOSIS — J9610 Chronic respiratory failure, unspecified whether with hypoxia or hypercapnia: Secondary | ICD-10-CM | POA: Diagnosis present

## 2022-03-25 DIAGNOSIS — I129 Hypertensive chronic kidney disease with stage 1 through stage 4 chronic kidney disease, or unspecified chronic kidney disease: Secondary | ICD-10-CM | POA: Diagnosis present

## 2022-03-25 DIAGNOSIS — I25119 Atherosclerotic heart disease of native coronary artery with unspecified angina pectoris: Secondary | ICD-10-CM | POA: Diagnosis present

## 2022-03-25 DIAGNOSIS — Z7901 Long term (current) use of anticoagulants: Secondary | ICD-10-CM | POA: Diagnosis not present

## 2022-03-25 DIAGNOSIS — Z955 Presence of coronary angioplasty implant and graft: Secondary | ICD-10-CM | POA: Diagnosis not present

## 2022-03-25 DIAGNOSIS — J069 Acute upper respiratory infection, unspecified: Secondary | ICD-10-CM | POA: Diagnosis present

## 2022-03-25 DIAGNOSIS — Z79899 Other long term (current) drug therapy: Secondary | ICD-10-CM | POA: Diagnosis not present

## 2022-03-25 DIAGNOSIS — Z87891 Personal history of nicotine dependence: Secondary | ICD-10-CM | POA: Diagnosis not present

## 2022-03-25 DIAGNOSIS — Z7951 Long term (current) use of inhaled steroids: Secondary | ICD-10-CM | POA: Diagnosis not present

## 2022-03-25 DIAGNOSIS — K219 Gastro-esophageal reflux disease without esophagitis: Secondary | ICD-10-CM | POA: Diagnosis present

## 2022-03-25 DIAGNOSIS — N179 Acute kidney failure, unspecified: Secondary | ICD-10-CM | POA: Diagnosis present

## 2022-03-25 DIAGNOSIS — I252 Old myocardial infarction: Secondary | ICD-10-CM | POA: Diagnosis not present

## 2022-03-25 LAB — TROPONIN I (HIGH SENSITIVITY): Troponin I (High Sensitivity): 2 ng/L (ref <18)

## 2022-03-25 MED ORDER — PRAVASTATIN SODIUM 10 MG PO TABS
20.0000 mg | ORAL_TABLET | Freq: Every evening | ORAL | Status: DC
  Administered 2022-03-25 – 2022-03-28 (×4): 20 mg via ORAL
  Filled 2022-03-25 (×4): qty 2

## 2022-03-25 MED ORDER — ALLOPURINOL 100 MG PO TABS
100.0000 mg | ORAL_TABLET | Freq: Every day | ORAL | Status: DC
  Administered 2022-03-25 – 2022-03-29 (×5): 100 mg via ORAL
  Filled 2022-03-25 (×5): qty 1

## 2022-03-25 MED ORDER — METHYLPREDNISOLONE SODIUM SUCC 125 MG IJ SOLR
120.0000 mg | INTRAMUSCULAR | Status: AC
  Administered 2022-03-25: 120 mg via INTRAVENOUS
  Filled 2022-03-25: qty 2

## 2022-03-25 MED ORDER — SODIUM CHLORIDE 0.9 % IV SOLN
500.0000 mg | INTRAVENOUS | Status: AC
  Administered 2022-03-25: 500 mg via INTRAVENOUS
  Filled 2022-03-25: qty 5

## 2022-03-25 MED ORDER — HYDRALAZINE HCL 20 MG/ML IJ SOLN: 5.0000 mg | INTRAMUSCULAR | Status: DC | PRN

## 2022-03-25 MED ORDER — ALBUTEROL SULFATE (2.5 MG/3ML) 0.083% IN NEBU
2.5000 mg | INHALATION_SOLUTION | RESPIRATORY_TRACT | Status: DC | PRN
  Administered 2022-03-26 – 2022-03-28 (×2): 2.5 mg via RESPIRATORY_TRACT
  Filled 2022-03-25 (×2): qty 3

## 2022-03-25 MED ORDER — ONDANSETRON HCL 4 MG/2ML IJ SOLN: 4.0000 mg | Freq: Four times a day (QID) | INTRAMUSCULAR | Status: DC | PRN

## 2022-03-25 MED ORDER — REVEFENACIN 175 MCG/3ML IN SOLN
175.0000 ug | Freq: Every day | RESPIRATORY_TRACT | Status: DC
  Administered 2022-03-26 – 2022-03-27 (×2): 175 ug via RESPIRATORY_TRACT
  Filled 2022-03-25 (×3): qty 3

## 2022-03-25 MED ORDER — APIXABAN 5 MG PO TABS
5.0000 mg | ORAL_TABLET | Freq: Two times a day (BID) | ORAL | Status: DC
  Administered 2022-03-25 – 2022-03-29 (×9): 5 mg via ORAL
  Filled 2022-03-25 (×8): qty 1
  Filled 2022-03-25: qty 2
  Filled 2022-03-25: qty 1

## 2022-03-25 MED ORDER — ACETAMINOPHEN 325 MG PO TABS
650.0000 mg | ORAL_TABLET | Freq: Four times a day (QID) | ORAL | Status: DC | PRN
  Administered 2022-03-25 – 2022-03-26 (×3): 650 mg via ORAL
  Filled 2022-03-25 (×4): qty 2

## 2022-03-25 MED ORDER — IPRATROPIUM-ALBUTEROL 0.5-2.5 (3) MG/3ML IN SOLN
3.0000 mL | Freq: Four times a day (QID) | RESPIRATORY_TRACT | Status: DC
  Administered 2022-03-25 (×3): 3 mL via RESPIRATORY_TRACT
  Filled 2022-03-25 (×3): qty 3

## 2022-03-25 MED ORDER — LACTATED RINGERS IV SOLN: INTRAVENOUS | Status: AC

## 2022-03-25 MED ORDER — IPRATROPIUM-ALBUTEROL 0.5-2.5 (3) MG/3ML IN SOLN
3.0000 mL | Freq: Three times a day (TID) | RESPIRATORY_TRACT | Status: DC
  Administered 2022-03-26: 3 mL via RESPIRATORY_TRACT
  Filled 2022-03-25: qty 3

## 2022-03-25 MED ORDER — POLYETHYLENE GLYCOL 3350 17 G PO PACK: 17.0000 g | PACK | Freq: Every day | ORAL | Status: DC | PRN

## 2022-03-25 MED ORDER — IPRATROPIUM-ALBUTEROL 0.5-2.5 (3) MG/3ML IN SOLN: 3.0000 mL | Freq: Four times a day (QID) | RESPIRATORY_TRACT | Status: DC | PRN

## 2022-03-25 MED ORDER — OXYCODONE HCL 5 MG PO TABS
5.0000 mg | ORAL_TABLET | ORAL | Status: DC | PRN
  Administered 2022-03-25 – 2022-03-29 (×2): 5 mg via ORAL
  Filled 2022-03-25 (×2): qty 1

## 2022-03-25 MED ORDER — PREDNISONE 20 MG PO TABS
40.0000 mg | ORAL_TABLET | Freq: Every day | ORAL | Status: DC
  Administered 2022-03-26 – 2022-03-27 (×2): 40 mg via ORAL
  Filled 2022-03-25 (×2): qty 2

## 2022-03-25 MED ORDER — ZOLPIDEM TARTRATE 5 MG PO TABS
5.0000 mg | ORAL_TABLET | Freq: Every evening | ORAL | Status: DC | PRN
  Administered 2022-03-25 – 2022-03-28 (×3): 5 mg via ORAL
  Filled 2022-03-25 (×3): qty 1

## 2022-03-25 MED ORDER — FINASTERIDE 5 MG PO TABS
5.0000 mg | ORAL_TABLET | Freq: Every day | ORAL | Status: DC
  Administered 2022-03-25 – 2022-03-29 (×5): 5 mg via ORAL
  Filled 2022-03-25 (×5): qty 1

## 2022-03-25 MED ORDER — ONDANSETRON HCL 4 MG PO TABS: 4.0000 mg | ORAL_TABLET | Freq: Four times a day (QID) | ORAL | Status: DC | PRN

## 2022-03-25 MED ORDER — ISOSORBIDE MONONITRATE ER 30 MG PO TB24
30.0000 mg | ORAL_TABLET | Freq: Every evening | ORAL | Status: DC
  Administered 2022-03-25 – 2022-03-28 (×4): 30 mg via ORAL
  Filled 2022-03-25 (×5): qty 1

## 2022-03-25 MED ORDER — ALPRAZOLAM 0.25 MG PO TABS
0.2500 mg | ORAL_TABLET | Freq: Every day | ORAL | Status: DC | PRN
  Administered 2022-03-25 – 2022-03-28 (×4): 0.25 mg via ORAL
  Filled 2022-03-25 (×4): qty 1

## 2022-03-25 MED ORDER — BUDESON-GLYCOPYRROL-FORMOTEROL 160-9-4.8 MCG/ACT IN AERO: 2.0000 | INHALATION_SPRAY | Freq: Two times a day (BID) | RESPIRATORY_TRACT | Status: DC

## 2022-03-25 MED ORDER — DOCUSATE SODIUM 100 MG PO CAPS
100.0000 mg | ORAL_CAPSULE | Freq: Two times a day (BID) | ORAL | Status: DC
  Administered 2022-03-25 – 2022-03-29 (×9): 100 mg via ORAL
  Filled 2022-03-25 (×9): qty 1

## 2022-03-25 MED ORDER — BISACODYL 5 MG PO TBEC: 5.0000 mg | DELAYED_RELEASE_TABLET | Freq: Every day | ORAL | Status: DC | PRN

## 2022-03-25 MED ORDER — MOMETASONE FURO-FORMOTEROL FUM 200-5 MCG/ACT IN AERO
2.0000 | INHALATION_SPRAY | Freq: Two times a day (BID) | RESPIRATORY_TRACT | Status: DC
  Administered 2022-03-25 – 2022-03-29 (×9): 2 via RESPIRATORY_TRACT
  Filled 2022-03-25 (×2): qty 8.8

## 2022-03-25 MED ORDER — METOPROLOL TARTRATE 25 MG PO TABS
25.0000 mg | ORAL_TABLET | Freq: Two times a day (BID) | ORAL | Status: DC
  Administered 2022-03-25 – 2022-03-29 (×9): 25 mg via ORAL
  Filled 2022-03-25 (×10): qty 1

## 2022-03-25 MED ORDER — ACETAMINOPHEN 650 MG RE SUPP
650.0000 mg | Freq: Four times a day (QID) | RECTAL | Status: DC | PRN
  Filled 2022-03-25 (×2): qty 1

## 2022-03-25 MED ORDER — GUAIFENESIN ER 600 MG PO TB12
600.0000 mg | ORAL_TABLET | Freq: Two times a day (BID) | ORAL | Status: DC | PRN
  Administered 2022-03-26: 600 mg via ORAL
  Filled 2022-03-25: qty 1

## 2022-03-25 MED ORDER — AZITHROMYCIN 250 MG PO TABS
500.0000 mg | ORAL_TABLET | Freq: Every day | ORAL | Status: AC
  Administered 2022-03-26 – 2022-03-29 (×4): 500 mg via ORAL
  Filled 2022-03-25 (×4): qty 2

## 2022-03-25 NOTE — ED Notes (Signed)
Call placed to [facility] at this time for admission transport

## 2022-03-25 NOTE — H&P (Signed)
History and Physical    Patient: Mitchell [name] OQH:[id number] DOB: 1940-08-21 DOA: 03/24/2022 DOS: the patient was seen and examined on 03/25/2022 PCP: Daisy Floro, MD  Patient coming from: Home - lives alone; [facility]: Ex-wife of 43 years, Haskel Schroeder, [phone]   Chief Complaint: SOB  HPI: Mitchell [name] is a 81 y.o. male with medical history significant of CAD s/p stents, BPH, stage 3 CKD, HTN, HLD, and COPD on home O2 presenting with SOB.  He was initially cantankerous and emotional, describing his frustrations overnight and requesting transfer to [facility]. He settled down and reports baseline 2-3L O2 requirement but worsening SOB and nonproductive cough.  Some pleuritic CP and abdominal pain.  No fever.      ER Course:  [facility] transfer, per Dr. Imogene Burn:  81 yo AAM with hx of COPD, chronic respiratory failure on home O2 2-3 L/min, ckd stage 3, presents to ER with SOB and productive cough. followed by [facility]. CXR negative for infiltrate. received IV solumedrol. hx of CHF. normal BNP.      Review of Systems: As mentioned in the history of present illness. All other systems reviewed and are negative. Past Medical History:  Diagnosis Date   Anxiety    BPH (benign prostatic hypertrophy)    CAD (coronary artery disease) 2007   with LAD/Diag CBPTCA and kissing    CKD (chronic kidney disease), stage III (HCC)    Hattie Perch 01/04/2017   COPD (chronic obstructive pulmonary disease) (HCC)    "maybe" (02/14/2012)   Depression    GERD (gastroesophageal reflux disease)    Hyperlipidemia    Hypertension    Hypoxemia    Myocardial infarction (HCC) 01/22/2012   NSTEMI with DES to RSA (PDA)   On home oxygen therapy    Shortness of breath    "walking and laying down sometimes" (02/14/2012)   Past Surgical History:  Procedure Laterality Date   CATARACT EXTRACTION W/ INTRAOCULAR LENS  IMPLANT, BILATERAL  ~ 2010   CORONARY ANGIOPLASTY WITH STENT PLACEMENT  05/11/2005    PCI OF LAD   GANGLION CYST REMOVED  1956?   right   LEFT HEART CATHETERIZATION WITH CORONARY ANGIOGRAM Bilateral 01/24/2012   Procedure: LEFT HEART CATHETERIZATION WITH CORONARY ANGIOGRAM;  Surgeon: Lesleigh Noe, MD;  Location: [facility] C[facility] LAB;  Service: Cardiovascular;  Laterality: Bilateral;   REFRACTIVE SURGERY  2010   left   Social History:  reports that he quit smoking about 10 years ago. His smoking use included pipe. He has never used smokeless tobacco. He reports that he does not currently use alcohol after a past usage of about 7.0 standard drinks of alcohol per week. He reports that he does not use drugs.  Allergies  Allergen Reactions   Lipitor [Atorvastatin] Other (See Comments)    "just paralyzed me; I can't take it"   Hydrocodone-Acetaminophen Hives   Rosuvastatin     Leg aches    Family History  Problem Relation Age of Onset   Heart failure Mother    Heart failure Brother     Prior to Admission medications   Medication Sig Start Date End Date Taking? Authorizing Provider  acetaminophen (TYLENOL) 500 MG tablet Take 500-1,000 mg by mouth daily as needed for headache (for arthritis pain).    [provider]  albuterol (PROVENTIL HFA;VENTOLIN HFA) 108 (90 BASE) MCG/ACT inhaler Inhale 2 puffs into the lungs every 6 (six) hours as needed for wheezing. Reported on 05/04/2015  [provider]  allopurinol (ZYLOPRIM) 100 MG tablet Take 100 mg by mouth daily. 11/07/20   [provider]  apixaban (ELIQUIS) 5 MG TABS tablet Take 1 tablet (5 mg total) by mouth 2 (two) times daily. 02/16/22   Lyn Records, MD  azithromycin [facility]) 250 MG tablet Take as directed 02/21/22   Martina Sinner, MD  Budeson-Glycopyrrol-Formoterol (BREZTRI AEROSPHERE) 160-9-4.8 MCG/ACT AERO Inhale 2 puffs into the lungs in the morning and at bedtime. 02/21/22   Martina Sinner, MD  finasteride (PROSCAR) 5 MG tablet Take 5 mg by mouth daily. Reported on 05/04/2015     [provider]  guaiFENesin (MUCINEX) 600 MG 12 hr tablet Take 1 tablet (600 mg total) by mouth 2 (two) times daily. Patient taking differently: Take 300 mg by mouth 2 (two) times daily. 05/19/21   Glenford Bayley, NP  ipratropium-albuterol (DUONEB) 0.5-2.5 (3) MG/3ML SOLN Take 3 mLs by nebulization every 6 (six) hours as needed. 02/21/22   Martina Sinner, MD  isosorbide mononitrate (IMDUR) 30 MG 24 hr tablet Take 1 tablet (30 mg total) by mouth every evening. Take after 5pm. 11/17/21   Lyn Records, MD  metoprolol tartrate (LOPRESSOR) 25 MG tablet TAKE 1 TABLET TWICE DAILY Patient taking differently: Take 25 mg by mouth 2 (two) times daily. 03/22/20   Lyn Records, MD  nitroGLYCERIN (NITROSTAT) 0.4 MG SL tablet Place 1 tablet (0.4 mg total) under the tongue every 5 (five) minutes as needed. Reported on 05/04/2015 08/20/17   Leone Brand, NP  OXYGEN Inhale 3 L into the lungs continuous as needed (for diffiulty breathing).    [provider]  pravastatin (PRAVACHOL) 20 MG tablet Take 1 tablet (20 mg total) by mouth every evening. 11/17/21   Lyn Records, MD    Physical Exam: Vitals:   03/25/22 0400 03/25/22 0445 03/25/22 0500 03/25/22 0827  BP: 116/70  109/70 117/72  Pulse: 67 75 73 69  Resp: 16 20 19 15   Temp:    97.8 F (36.6 C)  TempSrc:    Oral  SpO2:  96% 94% 90%   General:  Appears calm and comfortable and is in NAD, on 3L [place] O2 Eyes:  EOMI, normal lids, iris ENT:  grossly normal hearing, lips & tongue, mmm; artificial dentition Neck:  no LAD, masses or thyromegaly Cardiovascular:  RRR, no m/r/g. No LE edema.  Respiratory:   CTA bilaterally with no wheezes/rales/rhonchi.  Normal respiratory effort. Abdomen:  soft, NT, ND Skin:  no rash or induration seen on limited exam Musculoskeletal:  grossly normal tone BUE/BLE, good ROM, no bony abnormality Psychiatric:  cantankerous/emotionally labile mood and affect, speech fluent and appropriate, AOx3 Neurologic:   CN 2-12 grossly intact, moves all extremities in coordinated fashion   Radiological Exams on Admission: Independently reviewed - see discussion in A/P where applicable  DG Chest 2 View  Result Date: 03/24/2022 CLINICAL DATA:  SOB chronic cough EXAM: CHEST - 2 VIEW COMPARISON:  10/29/2021 FINDINGS: The heart size and mediastinal contours are within normal limits. Lungs are hyperinflated suggesting COPD. There is mild vascular congestion. There is no focal consolidation. No pneumothorax or pleural effusion. There are thoracic degenerative changes. IMPRESSION: Hyperinflated lungs consistent with COPD.  No focal consolidation. Electronically Signed   By: 10/31/2021 M.D.   On: 03/24/2022 20:22    EKG: Independently reviewed.  NSR with rate 64; no evidence of acute ischemia   Labs on Admission: I have personally reviewed  the available labs and imaging studies at the time of the admission.  Pertinent labs:    Glucose 104 BUN 23/Creatinine 1.67/GFR 41; 20/1.26/57 on 7/16 BNP 24 HS troponin 3, 2 Normal CBC COVID/flu/RSV negative   Assessment and Plan: Principal Problem:   COPD with acute exacerbation (HCC) Active Problems:   Dyslipidemia   History of depression   Essential hypertension   Coronary artery disease involving native coronary artery of native heart with angina pectoris (HCC)   Acute kidney injury superimposed on chronic kidney disease (HCC)   BPH (benign prostatic hyperplasia)   Atrial fibrillation, chronic (HCC)    COPD exacerbation -Patient's shortness of breath and productive cough are most likely caused by acute COPD exacerbation.  -He has history of O2-dependent COPD and was feeling SOB despite his home O2; he remains on 3L, which is not significantly different from his baseline -He sees pulmonology, last visit on 11/8 and was thought to have a COPD exacerbation at that time, treated with Azithromycin -He does not have fever or leukocytosis.  -Chest x-ray is  not consistent with pneumonia -will observe for now -Nebulizers: scheduled Duoneb and prn albuterol -Solu-Medrol 80 mg IV BID -> Prednisone 40 mg PO daily -IV -> PO Azithromycin -Continue Breztri -Coordinated care with TOC team/PT/OT/Nutrition/RT consults  AKI on stage 3a CKD -Mildly worse than usual baseline -Will give 500 cc IVF for now and hold further IVF -Attempt to avoid nephrotoxic medications -Recheck BMP in AM   Afib -Rate controlled with metoprolol -Continue Eliquis  CAD -s/p stents -Negative troponin, BNP -Appears to be compensated at this time -Continue Imdur  HTN -Continue metoprolol  HLD -Continue pravastatin  BPH -Continue finaseride  Depression -Patient was very upset about how he was treated on arrival and intermittently labile throughout evaluation -He further explained that he "recently" lost his son - he died in 2018/10/25 -He is not on medications for this issue -He may benefit from outpatient f/u, although his current mood is likely at least partially influenced by his perceived poor treatment -Will follow without intervention for now     Advance Care Planning:   Code Status: Full Code   Consults: TOC team/PT/OT/Nutrition/RT   DVT Prophylaxis: Eliquis  Family Communication: None present; I spoke with his ex-wife by telephone at the time of admission - she has a cold and so will not come to see him but she usually helps with his medications and will plan to pick him up at the time of discharge   Severity of Illness: The appropriate patient status for this patient is OBSERVATION. Observation status is judged to be reasonable and necessary in order to provide the required intensity of service to ensure the patient's safety. The patient's presenting symptoms, physical exam findings, and initial radiographic and laboratory data in the context of their medical condition is felt to place them at decreased risk for further clinical deterioration.  Furthermore, it is anticipated that the patient will be medically stable for discharge from the hospital within 2 midnights of admission.    Author: Jonah Blue, MD 03/25/2022 9:14 AM  For on call review [email or web address].

## 2022-03-25 NOTE — Plan of Care (Signed)
   Patient Name: Mitchell [name], Mitchell [name] DOB: 10/26/1973 MRN: [id number] Transferring facility: [facility] Requesting provider: lawsing, MD Reason for transfer:  81 yo AAM with hx of COPD, chronic respiratory failure on home O2 2-3 L/min, ckd stage 3, presents to ER with SOB and productive cough. followed by [facility]. CXR negative for infiltrate. received IV solumedrol. hx of CHF. normal BNP.  Going to: [facility] Admission Status: observation Bed Type:  med/tele To Do: nothing  TR[facility] will assume care on arrival to accepting facility. Until arrival, care as per EDP. However, [facility] available 24/7 for questions and assistance.   Nursing staff please page [facility] Admits and Consults [phone]) as soon as the patient arrives to the hospital.  Carollee Herter, DO [facility]

## 2022-03-25 NOTE — Progress Notes (Signed)
Mobility Specialist Progress Note   03/25/22 1259  Mobility  Activity Repositioned in chair  Level of Assistance Moderate assist, patient does 50-74%  Assistive Device Other (Comment) (HHA)  Activity Response Tolerated well  $Mobility charge 1 Mobility   Pt requesting to sit up for lunch. Assisted repositioning w/ modA. Pt c/o headache once repositioned. RN notified   Frederico Hamman Mobility Specialist Please contact via SecureChat or  Rehab office at [phone]

## 2022-03-26 DIAGNOSIS — I25119 Atherosclerotic heart disease of native coronary artery with unspecified angina pectoris: Secondary | ICD-10-CM

## 2022-03-26 DIAGNOSIS — J441 Chronic obstructive pulmonary disease with (acute) exacerbation: Secondary | ICD-10-CM | POA: Diagnosis not present

## 2022-03-26 DIAGNOSIS — I10 Essential (primary) hypertension: Secondary | ICD-10-CM | POA: Diagnosis not present

## 2022-03-26 DIAGNOSIS — I482 Chronic atrial fibrillation, unspecified: Secondary | ICD-10-CM | POA: Diagnosis not present

## 2022-03-26 LAB — BASIC METABOLIC PANEL
Anion gap: 10 (ref 5–15)
BUN: 23 mg/dL (ref 8–23)
CO2: 27 mmol/L (ref 22–32)
Calcium: 8.8 mg/dL — ABNORMAL LOW (ref 8.9–10.3)
Chloride: 100 mmol/L (ref 98–111)
Creatinine, Ser: 1.4 mg/dL — ABNORMAL HIGH (ref 0.61–1.24)
GFR, Estimated: 50 mL/min — ABNORMAL LOW (ref >60)
Glucose, Bld: 152 mg/dL — ABNORMAL HIGH (ref 70–99)
Potassium: 4.4 mmol/L (ref 3.5–5.1)
Sodium: 137 mmol/L (ref 135–145)

## 2022-03-26 LAB — CBC
HCT: 45.4 % (ref 39.0–52.0)
Hemoglobin: 14.9 g/dL (ref 13.0–17.0)
MCH: 28.9 pg (ref 26.0–34.0)
MCHC: 32.8 g/dL (ref 30.0–36.0)
MCV: 88.2 fL (ref 80.0–100.0)
Platelets: 196 10*3/uL (ref 150–400)
RBC: 5.15 MIL/uL (ref 4.22–5.81)
RDW: 13.8 % (ref 11.5–15.5)
WBC: 12.5 10*3/uL — ABNORMAL HIGH (ref 4.0–10.5)
nRBC: 0 % (ref 0.0–0.2)

## 2022-03-26 MED ORDER — IPRATROPIUM-ALBUTEROL 0.5-2.5 (3) MG/3ML IN SOLN
3.0000 mL | Freq: Two times a day (BID) | RESPIRATORY_TRACT | Status: DC
  Administered 2022-03-26 – 2022-03-27 (×2): 3 mL via RESPIRATORY_TRACT
  Filled 2022-03-26 (×2): qty 3

## 2022-03-26 MED ORDER — GUAIFENESIN ER 600 MG PO TB12
600.0000 mg | ORAL_TABLET | Freq: Two times a day (BID) | ORAL | Status: DC
  Administered 2022-03-26 – 2022-03-27 (×2): 600 mg via ORAL
  Filled 2022-03-26 (×2): qty 1

## 2022-03-26 MED ORDER — ENSURE ENLIVE PO LIQD
237.0000 mL | Freq: Two times a day (BID) | ORAL | Status: DC
  Administered 2022-03-26 – 2022-03-28 (×2): 237 mL via ORAL

## 2022-03-26 NOTE — Progress Notes (Signed)
[facility]  PROGRESS NOTE  Mitchell [name] AST:[id number] DOB: [birth year] DOA: 03/24/2022 PCP: Mitchell Floro, MD   Brief HPI:   81 y.o. male with medical history significant of CAD s/p stents, BPH, stage 3 CKD, HTN, HLD, and COPD on home O2 presenting with SOB.  He was initially cantankerous and emotional, describing his frustrations overnight and requesting transfer to [facility]. He settled down and reports baseline 2-3L O2 requirement but worsening SOB and nonproductive cough.  Some pleuritic CP and abdominal pain.  No fever.       Subjective   This morning patient still has cough.  Was feeling frustrated that, every time he gets out of bed ; alarm goes off.   Assessment/Plan:    COPD exacerbation -Presented with shortness of breath and productive cough -He is chronically on oxygen 3 L a minute at home -Continue scheduled DuoNebs and as needed albuterol -Started on Solu-Medrol 80 mg IV twice daily; has been switched to prednisone 40 mg daily -Continue p.o. azithromycin -Continue Breztri -Continue Mucinex 600 mg p.o. twice daily   Acute kidney injury on CKD stage IIIa -Creatinine is worse than usual baseline -Follow BMP in am  Atrial fibrillation -Heart rate controlled with metoprolol -Continue Eliquis for anticoagulation  CAD -S/p stent placement -Continue Imdur -Troponin is negative  Hypertension -Blood pressure controlled -Continue metoprolol  Hyperlipidemia -Continue atorvastatin  BPH -Continue finasteride   Depression -Patient was very upset about how he was treated on arrival and intermittently irritable in the hospital -He further explained that he "recently" lost his son - he died in 2018/10/31 -He is not on medications for this issue -He may benefit from outpatient f/u, although his current mood is likely at least partially influenced by his perceived poor treatment -Will follow without intervention for now   Medications     allopurinol  100  mg Oral Daily   apixaban  5 mg Oral BID   azithromycin  500 mg Oral Daily   docusate sodium  100 mg Oral BID   feeding supplement  237 mL Oral BID BM   finasteride  5 mg Oral Daily   ipratropium-albuterol  3 mL Nebulization BID   isosorbide mononitrate  30 mg Oral QPM   metoprolol tartrate  25 mg Oral BID   mometasone-formoterol  2 puff Inhalation BID   pravastatin  20 mg Oral QPM   predniSONE  40 mg Oral Q breakfast   revefenacin  175 mcg Nebulization Daily     Data Reviewed:   CBG:  No results for input(s): "GLUCAP" in the last 168 hours.  SpO2: 90 % O2 Flow Rate (L/min): 3 L/min    Vitals:   03/25/22 2230 03/26/22 0620 03/26/22 0739 03/26/22 0743  BP: 121/73 114/69 116/65   Pulse: 86 89 63 63  Resp: 18 18 16 16   Temp: 97.7 F (36.5 C) 97.8 F (36.6 C) 97.6 F (36.4 C)   TempSrc: Oral Oral Oral   SpO2: 96% 94% 90% 90%      Data Reviewed:  Basic Metabolic Panel: Recent Labs  Lab 03/24/22 2033 03/26/22 0311  NA 139 137  K 4.1 4.4  CL 98 100  CO2 34* 27  GLUCOSE 104* 152*  BUN 23 23  CREATININE 1.67* 1.40*  CALCIUM 9.1 8.8*    CBC: Recent Labs  Lab 03/24/22 2033 03/26/22 0311  WBC 8.9 12.5*  NEUTROABS 6.0  --   HGB 15.4 14.9  HCT 48.2 45.4  MCV 89.6 88.2  PLT 203 196    LFT No results for input(s): "AST", "ALT", "ALKPHOS", "BILITOT", "PROT", "ALBUMIN" in the last 168 hours.   Antibiotics: Anti-infectives (From admission, onward)    Start     Dose/Rate Route Frequency Ordered Stop   03/26/22 0945  azithromycin (ZITHROMAX) tablet 500 mg       See Hyperspace for full Linked Orders Report.   500 mg Oral Daily 03/25/22 0853 03/30/22 0959   03/25/22 0945  azithromycin (ZITHROMAX) 500 mg in sodium chloride 0.9 % 250 mL IVPB       See Hyperspace for full Linked Orders Report.   500 mg 250 mL/hr over 60 Minutes Intravenous Every 24 hours 03/25/22 0853 03/25/22 1317        DVT prophylaxis: Apixaban  Code Status: Full code  Family  Communication: No family at bedside   CONSULTS none   Objective    Physical Examination:   General-appears in no acute distress Heart-S1-S2, regular, no murmur auscultated Lungs-scattered rhonchi bilaterally Abdomen-soft, nontender, no organomegaly Extremities-no edema in the lower extremities Neuro-alert, oriented x3, no focal deficit noted  Status is: Inpatient:             [name]   [facility] If 7PM-7AM, please contact night-coverage at [email or web address], Office  [phone]   03/26/2022, 2:57 PM  LOS: 1 day

## 2022-03-26 NOTE — Progress Notes (Signed)
Initial Nutrition Assessment  DOCUMENTATION CODES:   Not applicable  INTERVENTION:  Adjust diet to dysphagia 3 for ease of chewing d/t moist textures and bite-size pieces Ensure Enlive po BID, each supplement provides 350 kcal and 20 grams of protein. Request updated measured weight  NUTRITION DIAGNOSIS:   Increased nutrient needs related to acute illness as evidenced by estimated needs.  GOAL:   Patient will meet greater than or equal to 90% of their needs  MONITOR:   PO intake, Supplement acceptance, Labs, Weight trends  REASON FOR ASSESSMENT:   Consult Assessment of nutrition requirement/status  ASSESSMENT:   Pt admitted from home with SOB r/t COPD exacerbation. PMH significant for CAD s/p stent, BPH, CKD stage 3, HTN, HLD, and COPD.  Pt noted to live alone. He lost his son 3 years ago. Pt is HOH and visually impaired.   Attempted to speak with pt at bedside. He was sleeping at time of visit but awoke to shoulder tap. He responded "pissed" when asked how he is doing. Pt verbalized many frustrations regarding his current admission and unable to elicit nutrition related history from pt d/t his current frustrations. He only mentions that he has been unable to eat recently d/t difficulty breathing and lack of energy. He reports having difficulty chewing foods d/t dentition, stating that he has to gum his food. Suspect that he would benefit from a dysphagia 3 diet to provide more bite-sized and moistened foods which may help with ease of chewing.   Meal completions: 12/10: 90% dinner  Last documented weight was 100.6 kg on 11/08. No updated weight on file this admission. It appears his weight has been trending down gradually within the last year. Noted a 7.1% weight loss since 01/20 which is not clinically significant for time frame.   Edema: non-pitting BLE  Medications: colace, prednisone  Labs: Cr 1.40, GFR 50  NUTRITION - FOCUSED PHYSICAL EXAM: Deferred to follow up  d/t pt agitation.   Diet Order:   Diet Order             DIET DYS 3 Room service appropriate? Yes with Assist; Fluid consistency: Thin  Diet effective now                   EDUCATION NEEDS:   Not appropriate for education at this time  Skin:  Skin Assessment: Reviewed RN Assessment  Last BM:  12/10 (type 5)  Height:   Ht Readings from Last 1 Encounters:  02/21/22 5\' 9"  (1.753 m)    Weight:   Wt Readings from Last 1 Encounters:  02/21/22 100.6 kg    Ideal Body Weight:  72.7 kg  BMI:  There is no height or weight on file to calculate BMI.  Estimated Nutritional Needs:   Kcal:  1800-2000  Protein:  90-105g  Fluid:  >/=1.8.13/08/23, RDN, LDN Clinical Nutrition

## 2022-03-26 NOTE — Evaluation (Signed)
Physical Therapy Evaluation Patient Details Name: Mitchell [name] MRN: [id number] DOB: 11/20/1940 Today's Date: 03/26/2022  History of Present Illness  Pt is an 81 y.o male presenting to ED for evaluation of worsening shortness of breath. Past medical history significant of NSTEMI, CAD s/p stents, BPH, stage 3 CKD, HTN, HLD, and COPD on home O2 2-3 L.  Clinical Impression  Pt demonstrates baseline functional mobility per pt. Due to vision deficits pt was educated on concern of getting around hospital room safely but pt demonstrates the ability to safely navigate with and without assistive device. Pt was very concerned about home situation due to he has no assistance with house keeping, meals or transportation except every 2 weeks from his previous spouse. Pt seemed agitated throughout session and required education to be re-stated and presented in multiple ways for understanding. Pt states that he has had PT before but they asked too much of him and feels that he is moving the same as he was prior to coming to the hospital. No skilled physical therapy services recommended at this time. Pt will benefit from continued mobility team intervention in order to maintain current functional status and decrease risk for debilitation while in acute care hospital setting.        Recommendations for follow up therapy are one component of a multi-disciplinary discharge planning process, led by the attending physician.  Recommendations may be updated based on patient status, additional functional criteria and insurance authorization.  Follow Up Recommendations No PT follow up      Assistance Recommended at Discharge Intermittent Supervision/Assistance  Patient can return home with the following  Assist for transportation;Assistance with cooking/housework    Equipment Recommendations None recommended by PT  Recommendations for Other Services       Functional Status Assessment Patient has not had a recent  decline in their functional status     Precautions / Restrictions Precautions Precautions: None Restrictions Weight Bearing Restrictions: No      Mobility  Bed Mobility Overal bed mobility: Independent                  Transfers Overall transfer level: Modified independent Equipment used: None, Straight cane                    Ambulation/Gait Ambulation/Gait assistance: Modified independent (Device/Increase time) Gait Distance (Feet): 40 Feet Assistive device: Straight cane Gait Pattern/deviations: Step-through pattern          Stairs    Pt has level entry into home.         Wheelchair Mobility    Modified Rankin (Stroke Patients Only)       Balance Overall balance assessment: Modified Independent           Pertinent Vitals/Pain Pain Assessment Pain Assessment: No/denies pain    Home Living Family/patient expects to be discharged to:: Private residence Living Arrangements: Alone Available Help at Discharge: Other (Comment) (pt ex spouse occasionally does laundry and will drive pt to the store every 2 weeks. He pays her for her services) Type of Home: Apartment Home Access: Level entry       Home Layout: One level Home Equipment: Cane - single point;Shower seat;Grab bars - tub/shower;Rollator (4 wheels) Additional Comments: Pt states that he has difficulty with his ADL's, house keeping and meals. He currently has no assistance    Prior Function Prior Level of Function : Independent/Modified Independent  Mobility Comments: Pt has 25 feet of O2 line per pt and he wears his O2 as needed at home.       Hand Dominance   Dominant Hand: Right    Extremity/Trunk Assessment   Upper Extremity Assessment Upper Extremity Assessment: Overall WFL for tasks assessed    Lower Extremity Assessment Lower Extremity Assessment: Overall WFL for tasks assessed    Cervical / Trunk Assessment Cervical / Trunk Assessment:  Kyphotic  Communication   Communication: No difficulties;HOH;Other (comment) (pt seems slightly agitated and hostile)  Cognition Arousal/Alertness: Awake/alert Behavior During Therapy: Agitated Overall Cognitive Status: Within Functional Limits for tasks assessed                 Assessment/Plan    PT Assessment Patient does not need any further PT services         PT Goals (Current goals can be found in the Care Plan section)  Acute Rehab PT Goals Patient Stated Goal: return home with assistance for transportation, meals and housekeeping. PT Goal Formulation: With patient Time For Goal Achievement: 04/09/22 Potential to Achieve Goals: Fair     AM-PAC PT "6 Clicks" Mobility  Outcome Measure Help needed turning from your back to your side while in a flat bed without using bedrails?: None Help needed moving from lying on your back to sitting on the side of a flat bed without using bedrails?: None Help needed moving to and from a bed to a chair (including a wheelchair)?: None Help needed standing up from a chair using your arms (e.g., wheelchair or bedside chair)?: None Help needed to walk in hospital room?: None Help needed climbing 3-5 steps with a railing? : A Little 6 Click Score: 23    End of Session Equipment Utilized During Treatment: Other (comment) (pt refused gait belt) Activity Tolerance: Patient tolerated treatment well Patient left: in bed;with call bell/phone within reach;with bed alarm set (notified MD and nursing on pt current functional status) Nurse Communication: Mobility status      Time: 4656-8127 PT Time Calculation (min) (ACUTE ONLY): 28 min   Charges:   PT Evaluation $PT Eval Low Complexity: 1 Low PT Treatments $Therapeutic Activity: 8-22 mins      Harrel Carina, DPT, CLT  Acute Rehabilitation Services Office: [phone] (Secure chat preferred)   Claudia Desanctis 03/26/2022, 10:35 AM

## 2022-03-26 NOTE — Evaluation (Signed)
Occupational Therapy Evaluation and Discharge Patient Details Name: Mitchell [name] MRN: [id number] DOB: May 03, 1940 Today's Date: 03/26/2022   History of Present Illness Pt is an 81 y.o male presenting to ED for evaluation of worsening shortness of breath. Past medical history significant of NSTEMI, CAD s/p stents, BPH, stage 3 CKD, HTN, HLD, and COPD on home O2 2-3 L.   Clinical Impression   Pt limited by low vision, but likely functioning at his baseline. He has had [facility] and mobile meals, but no longer receives. His ex wife assists with taking him to the grocery store and medical appointments. He reports struggling with IADLs. He is not considering assistive living. Suggested pt hire a caregiver. PT has reached out to case management for community resources. No further OT needs.      Recommendations for follow up therapy are one component of a multi-disciplinary discharge planning process, led by the attending physician.  Recommendations may be updated based on patient status, additional functional criteria and insurance authorization.   Follow Up Recommendations  No OT follow up     Assistance Recommended at Discharge Intermittent Supervision/Assistance  Patient can return home with the following Assist for transportation;Help with stairs or ramp for entrance;Direct supervision/assist for financial management;Direct supervision/assist for medications management    Functional Status Assessment  Patient has had a recent decline in their functional status and demonstrates the ability to make significant improvements in function in a reasonable and predictable amount of time.  Equipment Recommendations  None recommended by OT    Recommendations for Other Services       Precautions / Restrictions Precautions Precautions: Fall Precaution Comments: low vision, HOH Restrictions Weight Bearing Restrictions: No      Mobility Bed Mobility                General bed mobility comments: seated EOB upon arrival    Transfers Overall transfer level: Modified independent Equipment used: Straight cane               General transfer comment: slow to rise, stands momentarily before walking      Balance                                           ADL either performed or assessed with clinical judgement   ADL Overall ADL's : At baseline                                       General ADL Comments: pt limited in unfamiliar environment by his low vision     Vision Ability to See in Adequate Light: 2 Moderately impaired Patient Visual Report: No change from baseline Additional Comments: reports his vision is 20/400     Perception     Praxis      Pertinent Vitals/Pain Pain Assessment Pain Assessment: No/denies pain     Hand Dominance Right   Extremity/Trunk Assessment Upper Extremity Assessment Upper Extremity Assessment: Overall WFL for tasks assessed   Lower Extremity Assessment Lower Extremity Assessment: Defer to PT evaluation   Cervical / Trunk Assessment Cervical / Trunk Assessment: Kyphotic   Communication Communication Communication: HOH   Cognition Arousal/Alertness: Awake/alert Behavior During Therapy: Flat affect Overall Cognitive Status: No family/caregiver present to determine baseline cognitive functioning  General Comments: pt disoriented to situation     General Comments       Exercises     Shoulder Instructions      Home Living Family/patient expects to be discharged to:: Private residence Living Arrangements: Alone Available Help at Discharge: Other (Comment) (ex wife assists with groceries, transportatin) Type of Home: Apartment Home Access: Level entry     Home Layout: One level     Bathroom Shower/Tub: Chief Strategy Officer: Handicapped height Bathroom Accessibility: Yes   Home  Equipment: Cane - single point;Shower seat;Grab bars - tub/shower;Rollator (4 wheels)   Additional Comments: Pt states that he has difficulty with house keeping and meals. He currently has no assistance. He has had services for the blind and mobile meals in the past, but none currrently.      Prior Functioning/Environment Prior Level of Function : Independent/Modified Independent             Mobility Comments: walks with a cane ADLs Comments: assisted for getting groceries and to get to MD appointments, did not repond when asked how he pays his bills        OT Problem List:        OT Treatment/Interventions:      OT Goals(Current goals can be found in the care plan section)    OT Frequency:      Co-evaluation              AM-PAC OT "6 Clicks" Daily Activity     Outcome Measure Help from another person eating meals?: None Help from another person taking care of personal grooming?: A Little Help from another person toileting, which includes using toliet, bedpan, or urinal?: A Little Help from another person bathing (including washing, rinsing, drying)?: None Help from another person to put on and taking off regular upper body clothing?: None Help from another person to put on and taking off regular lower body clothing?: None 6 Click Score: 22   End of Session    Activity Tolerance: Patient tolerated treatment well Patient left: in chair;with call bell/phone within reach;with chair alarm set  OT Visit Diagnosis: Other abnormalities of gait and mobility (R26.89);Low vision, both eyes (H54.2)                Time: 0768-0881 OT Time Calculation (min): 36 min Charges:  OT General Charges $OT Visit: 1 Visit OT Evaluation $OT Eval Low Complexity: 1 Low OT Treatments $Self Care/Home Management : 8-22 mins  Berna Spare, OTR/L Acute Rehabilitation Services Office: [phone]   Evern Bio 03/26/2022, 1:57 PM

## 2022-03-27 MED ORDER — GUAIFENESIN ER 600 MG PO TB12
1200.0000 mg | ORAL_TABLET | Freq: Two times a day (BID) | ORAL | Status: DC
  Administered 2022-03-27 – 2022-03-29 (×4): 1200 mg via ORAL
  Filled 2022-03-27 (×4): qty 2

## 2022-03-27 MED ORDER — IPRATROPIUM-ALBUTEROL 0.5-2.5 (3) MG/3ML IN SOLN: 3.0000 mL | Freq: Four times a day (QID) | RESPIRATORY_TRACT | Status: DC

## 2022-03-27 MED ORDER — BENZONATATE 100 MG PO CAPS
200.0000 mg | ORAL_CAPSULE | Freq: Once | ORAL | Status: AC
  Administered 2022-03-27: 200 mg via ORAL
  Filled 2022-03-27: qty 2

## 2022-03-27 NOTE — Progress Notes (Signed)
Mobility Specialist - Progress Note   03/27/22 1657  Mobility  Activity Ambulated with assistance in hallway  Level of Assistance Contact guard assist, steadying assist  Assistive Device Cane  Distance Ambulated (ft) 300 ft  Activity Response Tolerated well  Mobility Referral Yes  $Mobility charge 1 Mobility    Pt received sitting EOB agreeable to mobility. Took seated break x1. Left in recliner w/ call bell in reach and all needs met.   [place] Specialist Please contact via SecureChat or Rehab office at [phone]

## 2022-03-27 NOTE — Progress Notes (Signed)
[facility]  PROGRESS NOTE  Mitchell [name] BHA:[id number] DOB: [birth year] DOA: 03/24/2022 PCP: Daisy Floro, MD   Brief HPI:   81 y.o. male with medical history significant of CAD s/p stents, BPH, stage 3 CKD, HTN, HLD, and COPD on home O2 presenting with SOB.  He was initially cantankerous and emotional, describing his frustrations overnight and requesting transfer to [facility]. He settled down and reports baseline 2-3L O2 requirement but worsening SOB and nonproductive cough.  Some pleuritic CP and abdominal pain.  No fever.       Subjective   Patient seen and examined, continues to have cough with phlegm.   Assessment/Plan:    COPD exacerbation -Presented with shortness of breath and productive cough -He is chronically on oxygen 3 L a minute at home -Change DuoNebs to every 6 hours scheduled -Started on Solu-Medrol 80 mg IV twice daily; has been switched to prednisone 40 mg daily -Continue p.o. azithromycin -Change Mucinex to 1200 mg p.o. twice daily -Flutter valve every 4 hours while awake   Acute kidney injury on CKD stage IIIa -Creatinine is worse than usual baseline -Follow BMP in am  Atrial fibrillation -Heart rate controlled with metoprolol -Continue Eliquis for anticoagulation  CAD -S/p stent placement -Continue Imdur -Troponin is negative  Hypertension -Blood pressure controlled -Continue metoprolol  Hyperlipidemia -Continue atorvastatin  BPH -Continue finasteride   Depression -Patient was very upset about how he was treated on arrival and intermittently irritable in the hospital -He further explained that he "recently" lost his son - he died in 20-Oct-2018 -He is not on medications for this issue -He may benefit from outpatient f/u, although his current mood is likely at least partially influenced by his perceived poor treatment -Will follow without intervention for now -Also offered psych evaluation,  however patient and his ex-wife have  declined   Medications     allopurinol  100 mg Oral Daily   apixaban  5 mg Oral BID   azithromycin  500 mg Oral Daily   docusate sodium  100 mg Oral BID   feeding supplement  237 mL Oral BID BM   finasteride  5 mg Oral Daily   guaiFENesin  1,200 mg Oral BID   ipratropium-albuterol  3 mL Nebulization Q6H   isosorbide mononitrate  30 mg Oral QPM   metoprolol tartrate  25 mg Oral BID   mometasone-formoterol  2 puff Inhalation BID   pravastatin  20 mg Oral QPM   predniSONE  40 mg Oral Q breakfast     Data Reviewed:   CBG:  No results for input(s): "GLUCAP" in the last 168 hours.  SpO2: 94 % O2 Flow Rate (L/min): 3 L/min    Vitals:   03/26/22 2017 03/26/22 2127 03/27/22 0255 03/27/22 0821  BP: 123/67  135/73   Pulse: 80  64   Resp: 20  19   Temp: 98.1 F (36.7 C)  97.6 F (36.4 C)   TempSrc: Oral  Oral   SpO2: 91% 95% 94% 94%      Data Reviewed:  Basic Metabolic Panel: Recent Labs  Lab 03/24/22 2033 03/26/22 0311  NA 139 137  K 4.1 4.4  CL 98 100  CO2 34* 27  GLUCOSE 104* 152*  BUN 23 23  CREATININE 1.67* 1.40*  CALCIUM 9.1 8.8*    CBC: Recent Labs  Lab 03/24/22 2033 03/26/22 0311  WBC 8.9 12.5*  NEUTROABS 6.0  --   HGB 15.4 14.9  HCT 48.2 45.4  MCV  89.6 88.2  PLT 203 196    LFT No results for input(s): "AST", "ALT", "ALKPHOS", "BILITOT", "PROT", "ALBUMIN" in the last 168 hours.   Antibiotics: Anti-infectives (From admission, onward)    Start     Dose/Rate Route Frequency Ordered Stop   03/26/22 0945  azithromycin (ZITHROMAX) tablet 500 mg       See Hyperspace for full Linked Orders Report.   500 mg Oral Daily 03/25/22 0853 03/30/22 0959   03/25/22 0945  azithromycin (ZITHROMAX) 500 mg in sodium chloride 0.9 % 250 mL IVPB       See Hyperspace for full Linked Orders Report.   500 mg 250 mL/hr over 60 Minutes Intravenous Every 24 hours 03/25/22 0853 03/25/22 1317        DVT prophylaxis: Apixaban  Code Status: Full code  Family  Communication: No family at bedside   CONSULTS none   Objective    Physical Examination:  General-appears in no acute distress Heart-S1-S2, regular, no murmur auscultated Lungs-bilateral rhonchi auscultated Abdomen-soft, nontender, no organomegaly Extremities-no edema in the lower extremities Neuro-alert, oriented x3, no focal deficit noted  Status is: Inpatient:             Meredeth Ide   [facility] If 7PM-7AM, please contact night-coverage at [email or web address], Office  [phone]   03/27/2022, 10:18 AM  LOS: 2 days

## 2022-03-27 NOTE — Progress Notes (Signed)
Patient belligerent to staff.  Floor staff attempt to assist with patient request to "talk to the staff", patient reports he " just wants to wash his own hair, and is that a problem"," and that he knows there was a change of shift at 7 and that over an hour has passed".    Patient made aware, staff will assist with daily cares as soon as possible.

## 2022-03-27 NOTE — Progress Notes (Signed)
Dulera not available to administer this am. Belongings checked, tube station and pyxis. RX paged for replacement.

## 2022-03-27 NOTE — Progress Notes (Signed)
Patient one person assist to bathroom with personal cane.  Patient assisted to bedside chair, with belongings and call light in reach.

## 2022-03-28 MED ORDER — IPRATROPIUM-ALBUTEROL 0.5-2.5 (3) MG/3ML IN SOLN
3.0000 mL | Freq: Four times a day (QID) | RESPIRATORY_TRACT | Status: DC
  Administered 2022-03-29: 3 mL via RESPIRATORY_TRACT
  Filled 2022-03-28: qty 3

## 2022-03-28 MED ORDER — METHYLPREDNISOLONE SODIUM SUCC 40 MG IJ SOLR
40.0000 mg | Freq: Two times a day (BID) | INTRAMUSCULAR | Status: DC
  Administered 2022-03-28 (×2): 40 mg via INTRAVENOUS
  Filled 2022-03-28 (×2): qty 1

## 2022-03-28 MED ORDER — IPRATROPIUM-ALBUTEROL 0.5-2.5 (3) MG/3ML IN SOLN
3.0000 mL | Freq: Four times a day (QID) | RESPIRATORY_TRACT | Status: DC
  Administered 2022-03-28 (×3): 3 mL via RESPIRATORY_TRACT
  Filled 2022-03-28 (×3): qty 3

## 2022-03-28 MED ORDER — BENZONATATE 100 MG PO CAPS
100.0000 mg | ORAL_CAPSULE | Freq: Two times a day (BID) | ORAL | Status: DC
  Administered 2022-03-28 – 2022-03-29 (×3): 100 mg via ORAL
  Filled 2022-03-28 (×3): qty 1

## 2022-03-28 MED ORDER — SODIUM CHLORIDE 3 % IN NEBU
4.0000 mL | INHALATION_SOLUTION | Freq: Every day | RESPIRATORY_TRACT | Status: DC
  Filled 2022-03-28: qty 4

## 2022-03-28 NOTE — Progress Notes (Signed)
Patient's family member called me in because they found a blue pill on the floor. I looked the pill up and it was finasteride. Family is requesting a copy of all meds taken by patient since his admission.

## 2022-03-28 NOTE — Progress Notes (Signed)
Mobility Specialist - Progress Note   03/28/22 1200  Mobility  Activity Ambulated with assistance in hallway  Level of Assistance Contact guard assist, steadying assist  Assistive Device Cane  Distance Ambulated (ft) 300 ft  Activity Response Tolerated well  Mobility Referral Yes  $Mobility charge 1 Mobility    Pt received in recliner agreeable to mobility. No complaints throughout. Took seated break x1. Left in recliner w/ call bell in reach and all needs met.  [place] Specialist Please contact via SecureChat or Rehab office at [phone]

## 2022-03-28 NOTE — Care Management Important Message (Signed)
Important Message  Patient Details  Name: CHICK COUSINS MRN: [id number] Date of Birth: [birth year]   Medicare Important Message Given:  Yes     Sherilyn Banker 03/28/2022, 1:19 PM

## 2022-03-28 NOTE — Progress Notes (Signed)
PROGRESS NOTE    Mitchell [name]  NOB:[id number] DOB: 1940/08/26 DOA: 03/24/2022 PCP: Daisy Floro, MD   Brief Narrative: 81 year old with past medical history significant for CAD status post stents, BPH, stage III CKD, hypertension, hyperlipidemia, COPD on home oxygen presented with shortness of breath.  He reported worsening cough and shortness of breath.  He was admitted for COPD exacerbation   Assessment & Plan:   Principal Problem:   COPD with acute exacerbation (HCC) Active Problems:   Dyslipidemia   History of depression   Essential hypertension   Coronary artery disease involving native coronary artery of native heart with angina pectoris (HCC)   Acute kidney injury superimposed on chronic kidney disease (HCC)   BPH (benign prostatic hyperplasia)   Atrial fibrillation, chronic (HCC)   1-COPD exacerbation: -On  3 L of oxygen at home -Will resume DuoNebs every 6 hours to schedule -Resume IV Solu-Medrol -He reported thick secretion and inability to cough up secretion.  Will start hypertonic saline twice daily.  Flutter valve.  Continue with guaifenesin Start tessalon pearl.  Continue with azithromycin  AKI on CKD stage III A:  Creatinine baseline 1.3--1.4. Cr Peak to 1.6 Monitor renal function.   A-fib: Continue with metoprolol and Eliquis  CAD status post stent placement.  Continue with Imdur  HTN;  Continue with metoprolol  Hyperlipidemia: Continue with atorvastatin  BPH: Continue with finasteride  Depression: He appears in better spirits today   Nutrition Problem: Increased nutrient needs Etiology: acute illness    Signs/Symptoms: estimated needs    Interventions: Ensure Enlive (each supplement provides 350kcal and 20 grams of protein), MVI, Refer to RD note for recommendations  Estimated body mass index is 32.75 kg/m as calculated from the following:   Height as of 02/21/22: 5\' 9"  (1.753 m).   Weight as of 02/21/22: 100.6 kg.   DVT  prophylaxis: Eliquis Code Status: Full code Family Communication: caregiver At the bedside Disposition Plan:  Status is: Inpatient Remains inpatient appropriate because: management of COPD    Consultants:  None  Procedures:    Antimicrobials:    Subjective: He continues to complain of cough, shortness of breath.  Phlegm is very thick.  He is having difficulty with expectoration  Objective: Vitals:   03/27/22 1945 03/28/22 0430 03/28/22 0749 03/28/22 0854  BP: 133/74 130/69 (!) 146/80   Pulse: 76 (!) 52 62   Resp: 18 18 16    Temp: (!) 97.4 F (36.3 C) 97.9 F (36.6 C) (!) 97.4 F (36.3 C)   TempSrc: Oral Oral Oral   SpO2: 97% 98% 94% 94%    Intake/Output Summary (Last 24 hours) at 03/28/2022 1553 Last data filed at 03/28/2022 0400 Gross per 24 hour  Intake --  Output 300 ml  Net -300 ml   There were no vitals filed for this visit.  Examination:  General exam: Appears calm and comfortable  Respiratory system: Bilateral rhonchorous and wheezing Cardiovascular system: S1 & S2 heard, RRR. No JVD, murmurs, rubs, gallops or clicks. No pedal edema. Gastrointestinal system: Abdomen is nondistended, soft and nontender. No organomegaly or masses felt. Normal bowel sounds heard. Central nervous system: Alert and oriented. No focal neurological deficits. Extremities: Symmetric 5 x 5 power.    Data Reviewed: I have personally reviewed following labs and imaging studies  CBC: Recent Labs  Lab 03/24/22 2033 03/26/22 0311  WBC 8.9 12.5*  NEUTROABS 6.0  --   HGB 15.4 14.9  HCT 48.2 45.4  MCV 89.6 88.2  PLT 203 [id number]   Basic Metabolic Panel: Recent Labs  Lab 03/24/22 2033 03/26/22 0311  NA 139 137  K 4.1 4.4  CL 98 100  CO2 34* 27  GLUCOSE 104* 152*  BUN 23 23  CREATININE 1.67* 1.40*  CALCIUM 9.1 8.8*   GFR: CrCl cannot be calculated (Unknown ideal weight.). Liver Function Tests: No results for input(s): "AST", "ALT", "ALKPHOS", "BILITOT", "PROT",  "ALBUMIN" in the last 168 hours. No results for input(s): "LIPASE", "AMYLASE" in the last 168 hours. No results for input(s): "AMMONIA" in the last 168 hours. Coagulation Profile: No results for input(s): "INR", "PROTIME" in the last 168 hours. Cardiac Enzymes: No results for input(s): "CKTOTAL", "CKMB", "CKMBINDEX", "TROPONINI" in the last 168 hours. BNP (last 3 results) No results for input(s): "PROBNP" in the last 8760 hours. HbA1C: No results for input(s): "HGBA1C" in the last 72 hours. CBG: No results for input(s): "GLUCAP" in the last 168 hours. Lipid Profile: No results for input(s): "CHOL", "HDL", "LDLCALC", "TRIG", "CHOLHDL", "LDLDIRECT" in the last 72 hours. Thyroid Function Tests: No results for input(s): "TSH", "T4TOTAL", "FREET4", "T3FREE", "THYROIDAB" in the last 72 hours. Anemia Panel: No results for input(s): "VITAMINB12", "FOLATE", "FERRITIN", "TIBC", "IRON", "RETICCTPCT" in the last 72 hours. Sepsis Labs: No results for input(s): "PROCALCITON", "LATICACIDVEN" in the last 168 hours.  Recent Results (from the past 240 hour(s))  Resp panel by RT-PCR (RSV, Flu A&B, Covid) Anterior Nasal Swab     Status: None   Collection Time: 03/24/22 10:48 PM   Specimen: Anterior Nasal Swab  Result Value Ref Range Status   SARS Coronavirus 2 by RT PCR NEGATIVE NEGATIVE Final    Comment: (NOTE) SARS-CoV-2 target nucleic acids are NOT DETECTED.  The SARS-CoV-2 RNA is generally detectable in upper respiratory specimens during the acute phase of infection. The lowest concentration of SARS-CoV-2 viral copies this assay can detect is 138 copies/mL. A negative result does not preclude SARS-Cov-2 infection and should not be used as the sole basis for treatment or other patient management decisions. A negative result may occur with  improper specimen collection/handling, submission of specimen other than nasopharyngeal swab, presence of viral mutation(s) within the areas targeted by this  assay, and inadequate number of viral copies(<138 copies/mL). A negative result must be combined with clinical observations, patient history, and epidemiological information. The expected result is Negative.  Fact Sheet for Patients:  EntrepreneurPulse.com.au  Fact Sheet for Healthcare Providers:  IncredibleEmployment.be  This test is no t yet approved or cleared by the [place] FDA and  has been authorized for detection and/or diagnosis of SARS-CoV-2 by FDA under an Emergency Use Authorization (EUA). This EUA will remain  in effect (meaning this test can be used) for the duration of the COVID-19 declaration under Section 564(b)(1) of the Act, 21 U.S.C.section 360bbb-3(b)(1), unless the authorization is terminated  or revoked sooner.       Influenza A by PCR NEGATIVE NEGATIVE Final   Influenza B by PCR NEGATIVE NEGATIVE Final    Comment: (NOTE) The Xpert Xpress SARS-CoV-2/FLU/RSV plus assay is intended as an aid in the diagnosis of influenza from Nasopharyngeal swab specimens and should not be used as a sole basis for treatment. Nasal washings and aspirates are unacceptable for Xpert Xpress SARS-CoV-2/FLU/RSV testing.  Fact Sheet for Patients: EntrepreneurPulse.com.au  Fact Sheet for Healthcare Providers: IncredibleEmployment.be  This test is not yet approved or cleared by the [place] FDA and has been authorized for detection and/or diagnosis of SARS-CoV-2 by FDA under  an Emergency Use Authorization (EUA). This EUA will remain in effect (meaning this test can be used) for the duration of the COVID-19 declaration under Section 564(b)(1) of the Act, 21 U.S.C. section 360bbb-3(b)(1), unless the authorization is terminated or revoked.     Resp Syncytial Virus by PCR NEGATIVE NEGATIVE Final    Comment: (NOTE) Fact Sheet for Patients: EntrepreneurPulse.com.au  Fact Sheet for  Healthcare Providers: IncredibleEmployment.be  This test is not yet approved or cleared by the [place] FDA and has been authorized for detection and/or diagnosis of SARS-CoV-2 by FDA under an Emergency Use Authorization (EUA). This EUA will remain in effect (meaning this test can be used) for the duration of the COVID-19 declaration under Section 564(b)(1) of the Act, 21 U.S.C. section 360bbb-3(b)(1), unless the authorization is terminated or revoked.  Performed at [facility], [address], [place], [place] [address]          Radiology Studies: No results found.      Scheduled Meds:  allopurinol  100 mg Oral Daily   apixaban  5 mg Oral BID   azithromycin  500 mg Oral Daily   benzonatate  100 mg Oral BID   docusate sodium  100 mg Oral BID   feeding supplement  237 mL Oral BID BM   finasteride  5 mg Oral Daily   guaiFENesin  1,200 mg Oral BID   ipratropium-albuterol  3 mL Nebulization Q6H   isosorbide mononitrate  30 mg Oral QPM   methylPREDNISolone (SOLU-MEDROL) injection  40 mg Intravenous Q12H   metoprolol tartrate  25 mg Oral BID   mometasone-formoterol  2 puff Inhalation BID   pravastatin  20 mg Oral QPM   Continuous Infusions:   LOS: 3 days    Time spent: 35 minutes    Anothony Bursch A Drenda Sobecki, MD [facility]   If 7PM-7AM, please contact night-coverage [email or web address]  03/28/2022, 3:53 PM

## 2022-03-29 LAB — BASIC METABOLIC PANEL
Anion gap: 6 (ref 5–15)
BUN: 26 mg/dL — ABNORMAL HIGH (ref 8–23)
CO2: 27 mmol/L (ref 22–32)
Calcium: 8.4 mg/dL — ABNORMAL LOW (ref 8.9–10.3)
Chloride: 106 mmol/L (ref 98–111)
Creatinine, Ser: 1.27 mg/dL — ABNORMAL HIGH (ref 0.61–1.24)
GFR, Estimated: 56 mL/min — ABNORMAL LOW (ref >60)
Glucose, Bld: 162 mg/dL — ABNORMAL HIGH (ref 70–99)
Potassium: 4.8 mmol/L (ref 3.5–5.1)
Sodium: 139 mmol/L (ref 135–145)

## 2022-03-29 MED ORDER — ENSURE ENLIVE PO LIQD: 237.0000 mL | Freq: Two times a day (BID) | ORAL | 12 refills | Status: DC

## 2022-03-29 MED ORDER — DEXTROMETHORPHAN POLISTIREX ER 30 MG/5ML PO SUER: 30.0000 mg | Freq: Two times a day (BID) | ORAL | 0 refills | Status: DC | PRN

## 2022-03-29 MED ORDER — BENZONATATE 100 MG PO CAPS: 100.0000 mg | ORAL_CAPSULE | Freq: Two times a day (BID) | ORAL | 0 refills | Status: DC

## 2022-03-29 MED ORDER — DOCUSATE SODIUM 100 MG PO CAPS: 100.0000 mg | ORAL_CAPSULE | Freq: Two times a day (BID) | ORAL | 0 refills | Status: DC

## 2022-03-29 MED ORDER — GUAIFENESIN ER 600 MG PO TB12: 1200.0000 mg | ORAL_TABLET | Freq: Two times a day (BID) | ORAL | 1 refills | Status: DC

## 2022-03-29 MED ORDER — IPRATROPIUM-ALBUTEROL 0.5-2.5 (3) MG/3ML IN SOLN
3.0000 mL | Freq: Four times a day (QID) | RESPIRATORY_TRACT | Status: DC
  Administered 2022-03-29: 3 mL via RESPIRATORY_TRACT
  Filled 2022-03-29: qty 3

## 2022-03-29 MED ORDER — DEXTROMETHORPHAN POLISTIREX ER 30 MG/5ML PO SUER: 30.0000 mg | Freq: Two times a day (BID) | ORAL | Status: DC | PRN

## 2022-03-29 MED ORDER — PREDNISONE 10 MG PO TABS: 40.0000 mg | ORAL_TABLET | Freq: Every day | ORAL | 0 refills | Status: AC

## 2022-03-29 MED ORDER — IPRATROPIUM-ALBUTEROL 0.5-2.5 (3) MG/3ML IN SOLN: 3.0000 mL | Freq: Four times a day (QID) | RESPIRATORY_TRACT | 11 refills | Status: DC

## 2022-03-29 NOTE — Discharge Summary (Signed)
Physician Discharge Summary   Patient: Mitchell [name] MRN: [id number] DOB: 05-14-1940  Admit date:     03/24/2022  Discharge date: 03/29/22  Discharge Physician: Alba Cory   PCP: Daisy Floro, MD   Recommendations at discharge:    Follow up on resolution of COPD exacerbation.   Discharge Diagnoses: Principal Problem:   COPD with acute exacerbation (HCC) Active Problems:   Dyslipidemia   History of depression   Essential hypertension   Coronary artery disease involving native coronary artery of native heart with angina pectoris (HCC)   Acute kidney injury superimposed on chronic kidney disease (HCC)   BPH (benign prostatic hyperplasia)   Atrial fibrillation, chronic (HCC)  Resolved Problems:   COPD exacerbation [facility])  Hospital Course: 81 year old with past medical history significant for CAD status post stents, BPH, stage III CKD, hypertension, hyperlipidemia, COPD on home oxygen presented with shortness of breath. He reported worsening cough and shortness of breath. He was admitted for COPD exacerbation   Assessment and Plan:  1-COPD exacerbation: -On  3 L of oxygen at home. Currently stable.  -Treated with DuoNebs every 6 hours to schedule. Continue at discharge.  -Treated  IV Solu-Medrol-- no wheezing today, discharge on prednisone.  -He reported thick secretion and inability to cough up secretion.  received  hypertonic saline twice daily.  Flutter valve.  Continue with guaifenesin Started tessalon pearl.  Completed azithromycin He is medical stable for discharge.   AKI on CKD stage III A:  Creatinine baseline 1.3--1.4. Cr Peak to 1.6 Monitor renal function.    A-fib: Continue with metoprolol and Eliquis   CAD status post stent placement.  Continue with Imdur   HTN;  Continue with metoprolol   Hyperlipidemia: Continue with atorvastatin   BPH: Continue with finasteride   Depression: He appears in better spirits today     Nutrition Problem:  Increased nutrient needs Etiology: acute illness          Consultants: None Procedures performed: None Disposition: Home Diet recommendation:  Discharge Diet Orders (From admission, onward)     Start     Ordered   03/29/22 0000  Diet - low sodium heart healthy        03/29/22 1013           Cardiac diet DISCHARGE MEDICATION: Allergies as of 03/29/2022       Reactions   Lipitor [atorvastatin] Other (See Comments)   Severe myalgias   Crestor [rosuvastatin] Other (See Comments)   Myalgias    Norco [hydrocodone-acetaminophen] Hives        Medication List     TAKE these medications    albuterol 108 (90 Base) MCG/ACT inhaler Commonly known as: VENTOLIN HFA Inhale 2 puffs into the lungs every 6 (six) hours as needed for wheezing. Reported on 05/04/2015   allopurinol 100 MG tablet Commonly known as: ZYLOPRIM Take 100 mg by mouth in the morning.   ALPRAZolam 0.25 MG tablet Commonly known as: XANAX Take 0.25 mg by mouth daily as needed for anxiety.   apixaban 5 MG Tabs tablet Commonly known as: Eliquis Take 1 tablet (5 mg total) by mouth 2 (two) times daily.   benzonatate 100 MG capsule Commonly known as: TESSALON Take 1 capsule (100 mg total) by mouth 2 (two) times daily.   Breztri Aerosphere 160-9-4.8 MCG/ACT Aero Generic drug: Budeson-Glycopyrrol-Formoterol Inhale 2 puffs into the lungs in the morning and at bedtime.   dextromethorphan 30 MG/5ML liquid Commonly known as: DELSYM Take 5 mLs (  30 mg total) by mouth 2 (two) times daily as needed for cough.   docusate sodium 100 MG capsule Commonly known as: COLACE Take 1 capsule (100 mg total) by mouth 2 (two) times daily.   feeding supplement Liqd Take 237 mLs by mouth 2 (two) times daily between meals.   finasteride 5 MG tablet Commonly known as: PROSCAR Take 5 mg by mouth in the morning. Reported on 05/04/2015   Fluorouracil 5 % Soln Apply 1 application  topically daily.   guaiFENesin 600 MG  12 hr tablet Commonly known as: MUCINEX Take 2 tablets (1,200 mg total) by mouth 2 (two) times daily.   ipratropium-albuterol 0.5-2.5 (3) MG/3ML Soln Commonly known as: DUONEB Take 3 mLs by nebulization every 6 (six) hours. What changed:  when to take this reasons to take this   isosorbide mononitrate 30 MG 24 hr tablet Commonly known as: IMDUR Take 1 tablet (30 mg total) by mouth every evening. Take after 5pm. What changed: additional instructions   metoprolol tartrate 25 MG tablet Commonly known as: LOPRESSOR TAKE 1 TABLET TWICE DAILY   nitroGLYCERIN 0.4 MG SL tablet Commonly known as: NITROSTAT Place 1 tablet (0.4 mg total) under the tongue every 5 (five) minutes as needed. Reported on 05/04/2015   potassium chloride 10 MEQ tablet Commonly known as: KLOR-CON Take 10 mEq by mouth in the morning.   pravastatin 20 MG tablet Commonly known as: PRAVACHOL Take 1 tablet (20 mg total) by mouth every evening.   predniSONE 10 MG tablet Commonly known as: DELTASONE Take 4 tablets (40 mg total) by mouth daily with breakfast for 4 days.   TYLENOL SINUS+HEADACHE PO Take 1 tablet by mouth 2 (two) times daily as needed (sinus pressure/pain).        Follow-up Information     [facility] Follow up.   Specialty: [facility] Contact information: [address] [place] [address] [phone]         Daisy Floro, MD Follow up in 1 week(s).   Specialty: Family Medicine Contact information: [address] [place] [address] [phone]                Discharge Exam: There were no vitals filed for this visit. General; NAD  Condition at discharge: stable  The results of significant diagnostics from this hospitalization (including imaging, microbiology, ancillary and laboratory) are listed below for reference.   Imaging Studies: DG Chest 2 View  Result Date: 03/24/2022 CLINICAL DATA:  SOB chronic cough EXAM: CHEST  - 2 VIEW COMPARISON:  10/29/2021 FINDINGS: The heart size and mediastinal contours are within normal limits. Lungs are hyperinflated suggesting COPD. There is mild vascular congestion. There is no focal consolidation. No pneumothorax or pleural effusion. There are thoracic degenerative changes. IMPRESSION: Hyperinflated lungs consistent with COPD.  No focal consolidation. Electronically Signed   By: Layla Maw M.D.   On: 03/24/2022 20:22    Microbiology: Results for orders placed or performed during the hospital encounter of 03/24/22  Resp panel by RT-PCR (RSV, Flu A&B, Covid) Anterior Nasal Swab     Status: None   Collection Time: 03/24/22 10:48 PM   Specimen: Anterior Nasal Swab  Result Value Ref Range Status   SARS Coronavirus 2 by RT PCR NEGATIVE NEGATIVE Final    Comment: (NOTE) SARS-CoV-2 target nucleic acids are NOT DETECTED.  The SARS-CoV-2 RNA is generally detectable in upper respiratory specimens during the acute phase of infection. The lowest concentration  of SARS-CoV-2 viral copies this assay can detect is 138 copies/mL. A negative result does not preclude SARS-Cov-2 infection and should not be used as the sole basis for treatment or other patient management decisions. A negative result may occur with  improper specimen collection/handling, submission of specimen other than nasopharyngeal swab, presence of viral mutation(s) within the areas targeted by this assay, and inadequate number of viral copies(<138 copies/mL). A negative result must be combined with clinical observations, patient history, and epidemiological information. The expected result is Negative.  Fact Sheet for Patients:  BloggerCourse.com  Fact Sheet for Healthcare Providers:  SeriousBroker.it  This test is no t yet approved or cleared by the [place] FDA and  has been authorized for detection and/or diagnosis of SARS-CoV-2 by FDA under an  Emergency Use Authorization (EUA). This EUA will remain  in effect (meaning this test can be used) for the duration of the COVID-19 declaration under Section 564(b)(1) of the Act, 21 U.S.C.section 360bbb-3(b)(1), unless the authorization is terminated  or revoked sooner.       Influenza A by PCR NEGATIVE NEGATIVE Final   Influenza B by PCR NEGATIVE NEGATIVE Final    Comment: (NOTE) The Xpert Xpress SARS-CoV-2/FLU/RSV plus assay is intended as an aid in the diagnosis of influenza from Nasopharyngeal swab specimens and should not be used as a sole basis for treatment. Nasal washings and aspirates are unacceptable for Xpert Xpress SARS-CoV-2/FLU/RSV testing.  Fact Sheet for Patients: BloggerCourse.com  Fact Sheet for Healthcare Providers: SeriousBroker.it  This test is not yet approved or cleared by the [place] FDA and has been authorized for detection and/or diagnosis of SARS-CoV-2 by FDA under an Emergency Use Authorization (EUA). This EUA will remain in effect (meaning this test can be used) for the duration of the COVID-19 declaration under Section 564(b)(1) of the Act, 21 U.S.C. section 360bbb-3(b)(1), unless the authorization is terminated or revoked.     Resp Syncytial Virus by PCR NEGATIVE NEGATIVE Final    Comment: (NOTE) Fact Sheet for Patients: BloggerCourse.com  Fact Sheet for Healthcare Providers: SeriousBroker.it  This test is not yet approved or cleared by the [place] FDA and has been authorized for detection and/or diagnosis of SARS-CoV-2 by FDA under an Emergency Use Authorization (EUA). This EUA will remain in effect (meaning this test can be used) for the duration of the COVID-19 declaration under Section 564(b)(1) of the Act, 21 U.S.C. section 360bbb-3(b)(1), unless the authorization is terminated or revoked.  Performed at [facility], [address] [place], [place] [address]     Labs: CBC: Recent Labs  Lab 03/24/22 2033 03/26/22 0311  WBC 8.9 12.5*  NEUTROABS 6.0  --   HGB 15.4 14.9  HCT 48.2 45.4  MCV 89.6 88.2  PLT 203 196   Basic Metabolic Panel: Recent Labs  Lab 03/24/22 2033 03/26/22 0311 03/29/22 0233  NA 139 137 139  K 4.1 4.4 4.8  CL 98 100 106  CO2 34* 27 27  GLUCOSE 104* 152* 162*  BUN 23 23 26*  CREATININE 1.67* 1.40* 1.27*  CALCIUM 9.1 8.8* 8.4*   Liver Function Tests: No results for input(s): "AST", "ALT", "ALKPHOS", "BILITOT", "PROT", "ALBUMIN" in the last 168 hours. CBG: No results for input(s): "GLUCAP" in the last 168 hours.  Discharge time spent: greater than 30 minutes.  Signed: Alba Cory, MD [facility] 03/29/2022

## 2022-03-29 NOTE — Plan of Care (Signed)
  Problem: Education: Goal: Knowledge of disease or condition will improve Outcome: Progressing   Problem: Respiratory: Goal: Ability to maintain a clear airway will improve Outcome: Progressing

## 2022-03-29 NOTE — Plan of Care (Signed)
Patient ID: Mitchell [name], male   DOB: 05/01/1940, 81 y.o.   MRN: [id number]  Problem: Increased Nutrient Needs (NI-5.1) Goal: Food and/or nutrient delivery Description: Individualized approach for food/nutrient provision. Outcome: Adequate for Discharge   Problem: Education: Goal: Knowledge of disease or condition will improve Outcome: Adequate for Discharge   Problem: Respiratory: Goal: Ability to maintain a clear airway will improve Outcome: Adequate for Discharge  Lidia Collum, RN

## 2022-03-29 NOTE — TOC Initial Note (Addendum)
Transition of Care (TOC) - Initial/Assessment Note  Spoke to patient at bedside. From home alone. Has home oxygen through [place]. Has portable oxygen DME . Patient states portable oxygen is in the truck of the man who brought him to hospital and he is at work. Patient also has cane and NEB machine.  Patient states his ex wife assists him to appointments.   Patient has had [facility] in past and agreeable to [facility] . NCM will also ask for order for [facility] for further home assistance.   Kelly with [facility] accepted referral.  [id number] Patient has been discharged needs portable tank to go home with. Called Kristin with [employer] , she will have portable tank delivered to hospital room ASAP. Patient has a ride. Claiborne Billings with [facility] aware discharge is today    Patient Details  Name: Mitchell [name] MRN: [id number] Date of Birth: 1940-09-30  Transition of Care [facility]) CM/SW Contact:    Marilu Favre, RN Phone Number: 03/29/2022, 8:40 AM  Clinical Narrative:                   Expected Discharge Plan: [place] Barriers to Discharge: Continued Medical Work up   Patient Goals and CMS Choice Patient states their goals for this hospitalization and ongoing recovery are:: to return to home CMS Medicare.gov Compare Post Acute Care list provided to:: Patient Choice offered to / list presented to : Patient  Expected Discharge Plan and Services Expected Discharge Plan: [place]   Discharge Planning Services: CM Consult Post Acute Care Choice: [place] arrangements for the past 2 months: Apartment                 DME Arranged: N/A         HH Arranged: RN, Social Work [employer] Agency: [place] Date [place]: 03/29/22 Time [place]: [phone] Representative spoke with at [place]: [place] Arrangements/Services Living arrangements for the past 2 months: [place] with:: Self Patient language and  need for interpreter reviewed:: Yes Do you feel safe going back to the place where you live?: Yes      Need for Family Participation in Patient Care: Yes (Comment) Care giver support system in place?: Yes (comment) Current home services: DME Criminal Activity/Legal Involvement Pertinent to Current Situation/Hospitalization: No - Comment as needed  Activities of Daily Living Home Assistive Devices/Equipment: Cane (specify quad or straight), Walker (specify type) ADL Screening (condition at time of admission) Patient's cognitive ability adequate to safely complete daily activities?: Yes Is the patient deaf or have difficulty hearing?: Yes Does the patient have difficulty seeing, even when wearing glasses/contacts?: Yes Does the patient have difficulty concentrating, remembering, or making decisions?: No Patient able to express need for assistance with ADLs?: Yes Does the patient have difficulty dressing or bathing?: Yes Independently performs ADLs?: Yes (appropriate for developmental age) Does the patient have difficulty walking or climbing stairs?: Yes Weakness of Legs: Both Weakness of Arms/Hands: Both  Permission Sought/Granted   Permission granted to share information with : No              Emotional Assessment Appearance:: Appears stated age   Affect (typically observed): Agitated Orientation: : Oriented to Self, Oriented to Place, Oriented to  Time, Oriented to Situation Alcohol / Substance Use: Not Applicable Psych Involvement: No (comment)  Admission diagnosis:  COPD exacerbation ([place]) [J44.1] AKI (acute kidney injury) ([place]) [N17.9] Upper respiratory  tract infection, unspecified type [J06.9] COPD with acute exacerbation (HCC) [J44.1] Patient Active Problem List   Diagnosis Date Noted   Atrial fibrillation, chronic (HCC) 03/25/2022   Acute on chronic diastolic CHF (congestive heart failure) (HCC) 08/24/2021   Muscle spasm 08/24/2021   Acute CHF (congestive heart  failure) (HCC) 08/23/2021   Acute on chronic respiratory failure with hypoxia (HCC) 08/23/2021   Obesity (BMI 30-39.9) 08/23/2021   COPD with acute exacerbation (HCC) 05/04/2021   Pulmonary nodule 05/02/2021   BPH (benign prostatic hyperplasia) 05/01/2021   Chronic congestive heart failure (HCC) 05/01/2021   Effusion, right knee 01/10/2017   Osteoarthritis of right knee 01/10/2017   Gout of right knee 01/10/2017   Dehydration    Atypical atrial flutter (HCC)    Dizziness 01/04/2017   Right shoulder pain 01/04/2017   Hypokalemia 01/04/2017   Influenza 05/10/2016   CKD (chronic kidney disease), stage III (HCC) 05/10/2016   Acute respiratory failure with hypoxia (HCC) 05/02/2016   Acute kidney injury superimposed on chronic kidney disease (HCC) 05/02/2016   Gout flare 05/02/2016   Chronic respiratory failure with hypoxia (HCC) 03/15/2015   COPD with acute exacerbation (HCC) 01/31/2015   Vision impairment 01/31/2015   Right foot pain 01/31/2015   Obesity 01/31/2015   Alcohol use 01/31/2015   COPD (chronic obstructive pulmonary disease) (HCC) 04/30/2013   Dyspnea 02/14/2012   Dyslipidemia    History of depression    Chest pain 01/23/2012   Coronary artery disease involving native coronary artery of native heart with angina pectoris (HCC) 01/23/2012   Cholelithiasis    Shortness of breath 04/29/2011   Essential hypertension 04/29/2011   PCP:  Daisy Floro, MD Pharmacy:   [id number] [place], [place] - [address] [place] [address] Phone: [phone] Fax: [phone]  [facility] - [place], [place] - [address] [place] [address] Phone: [phone] Fax: [phone]  [facility] [address] [place] [address] Phone: [phone] Fax: [phone]     Social Determinants of Health (SDOH) Interventions    Readmission Risk  Interventions     No data to display

## 2022-03-29 NOTE — Progress Notes (Signed)
Pt was very agitated in the beginning of shift complaining that he had been OOB since yesterday morning and no body was caring for him. Son at bedside. Son and pt were both rude to staff at shift change. Pt requesting to go back to bed, at 2000 last evening pt was put back to bed, resting comfortable with no complaints. This a.m. he is complaining that nobody checked on him and states he did not sleep all night. The door stayed partial open during the shift so we could check on him. He slept at intervals during the night. Appeared comfortable. I received no verbal complaints tonight from pt.Mitchell [name]

## 2022-03-30 DIAGNOSIS — I130 Hypertensive heart and chronic kidney disease with heart failure and stage 1 through stage 4 chronic kidney disease, or unspecified chronic kidney disease: Secondary | ICD-10-CM | POA: Diagnosis not present

## 2022-03-30 DIAGNOSIS — M1711 Unilateral primary osteoarthritis, right knee: Secondary | ICD-10-CM | POA: Diagnosis not present

## 2022-03-30 DIAGNOSIS — I482 Chronic atrial fibrillation, unspecified: Secondary | ICD-10-CM | POA: Diagnosis not present

## 2022-03-30 DIAGNOSIS — I5032 Chronic diastolic (congestive) heart failure: Secondary | ICD-10-CM | POA: Diagnosis not present

## 2022-03-30 DIAGNOSIS — J449 Chronic obstructive pulmonary disease, unspecified: Secondary | ICD-10-CM | POA: Diagnosis not present

## 2022-03-30 DIAGNOSIS — N1831 Chronic kidney disease, stage 3a: Secondary | ICD-10-CM | POA: Diagnosis not present

## 2022-03-30 DIAGNOSIS — I2510 Atherosclerotic heart disease of native coronary artery without angina pectoris: Secondary | ICD-10-CM | POA: Diagnosis not present

## 2022-03-30 DIAGNOSIS — J9612 Chronic respiratory failure with hypercapnia: Secondary | ICD-10-CM | POA: Diagnosis not present

## 2022-03-30 DIAGNOSIS — J9611 Chronic respiratory failure with hypoxia: Secondary | ICD-10-CM | POA: Diagnosis not present

## 2022-04-25 ENCOUNTER — Encounter: Payer: Self-pay | Admitting: Pulmonary Disease

## 2022-04-25 ENCOUNTER — Ambulatory Visit (INDEPENDENT_AMBULATORY_CARE_PROVIDER_SITE_OTHER): Payer: Medicare PPO | Admitting: Pulmonary Disease

## 2022-04-25 VITALS — BP 118/80 | HR 58 | Ht 69.0 in | Wt 220.0 lb

## 2022-04-25 DIAGNOSIS — J4489 Other specified chronic obstructive pulmonary disease: Secondary | ICD-10-CM | POA: Diagnosis not present

## 2022-04-25 DIAGNOSIS — J449 Chronic obstructive pulmonary disease, unspecified: Secondary | ICD-10-CM

## 2022-04-25 NOTE — Progress Notes (Signed)
Synopsis: Referred in September 2022 for COPD by Otelia Santee, PA  Subjective:   PATIENT ID: Mitchell [name] GENDER: male DOB: 01-07-1941, MRN: [id number]  HPI  Chief Complaint  Patient presents with   Hospitalization Follow-up   Mitchell [name] is an 82 year old male, former smoker with CAD, CKD stage III, GERD, hypertension, chronic hypoxemic respiratory failure due to emphysema who returns to pulmonary clinic for COPD.  Patient was admitted 12/10 to 12/14 COPD with acute exacerbation.   He is using breztri 2 puffs twice daily with a spacer mask and is using duonebs throughout the day with flutter valve therapy. He feels less congested since being in the hospital. He continues to have significant dyspnea.   OV 02/21/22 CT Chest 01/12/22 shows resolution of pulmonary nodules in question from prior scan.   He has been doing ok since last visit on breztri 2 puffs twice daily and PRN albuterol nebulizer treatments about 3 times per day. He reports having a harder time breathing over the last week with increased cough/sputum production.   He is very limited in his functional abilities and is need more help at home.   OV 11/16/21 He reports increased shortness of breath since yesterday. He feels he is struggling to breath at this time.   He was treated in ER 10/29/21 for COPD exacerbation.   OV 08/04/21 He was seen by Derl Barrow, NP on 05/19/21 for hospital follow up after being admitted for COPD exacerbation. His trelegy was increased to 200 at last visit. A new 11-46mm RUL nodule was noted on CTA on 05/02/21. Repeat CT Chest 08/01/21 showed mostly resolved RUL nodule. New irregular nodule of the left lower lobe abutting the posterior pleural margin.  He reports he feels he is getting worse. He is having a lot of trouble breathing. He had covid 2 months ago.   Using nebulizer machine 2-3 times per day. Using 2-3L of oxygen day/night. He has been out of trelegy ellipta 200 over the past 2  weeks.   OV 01/13/2021 He had an ER visit 12/13/20 for dyspnea and was treated for COPD exacerbation. He has been doing ok since last month but continues to have bad days with his breathing throughout the week with increased cough, wheezing and dyspnea.   He is using dulera 100-3mcg 2 puffs twice daily and as needed albuterol throughout the day. He has limited eyesight and reports some difficulty using his inhalers.   He is a former smoker, smoked a pipe from [place] to 2013. He also reports a significant amount of second hand smoke throughout his life. He worked as a Administrator.   He is on 2.5L of supplemental oxygen at home which he uses throughout the day and night.    Past Medical History:  Diagnosis Date   Anxiety    BPH (benign prostatic hypertrophy)    CAD (coronary artery disease) 2007   with LAD/Diag CBPTCA and kissing    CKD (chronic kidney disease), stage III ([place])    Archie Endo 01/04/2017   COPD (chronic obstructive pulmonary disease) ([place])    "maybe" (02/14/2012)   Depression    GERD (gastroesophageal reflux disease)    Hyperlipidemia    Hypertension    Hypoxemia    Myocardial infarction ([place]) 01/22/2012   NSTEMI with DES to RSA (PDA)   On home oxygen therapy    Shortness of breath    "walking and laying down sometimes" (02/14/2012)     Family History  Problem Relation Age of Onset   Heart failure Mother    Heart failure Brother      Social History   Socioeconomic History   Marital status: Single    Spouse name: Not on file   Number of children: 3   Years of education: Not on file   Highest education level: Not on file  Occupational History   Not on file  Tobacco Use   Smoking status: Former    Types: Pipe    Quit date: 04/28/2011    Years since quitting: 11.0   Smokeless tobacco: Never  Vaping Use   Vaping Use: Never used  Substance and Sexual Activity   Alcohol use: Not Currently    Alcohol/week: 7.0 standard drinks of alcohol    Types: 7 Standard  drinks or equivalent per week    Comment: 02/14/2012 "weekends I have 6-7 mixed drinks"     occasional   Drug use: No   Sexual activity: Yes  Other Topics Concern   Not on file  Social History Narrative   Truck driver      Social Determinants of Health   Financial Resource Strain: Not on file  Food Insecurity: No Food Insecurity (03/26/2022)   Hunger Vital Sign    Worried About Running Out of Food in the Last Year: Never true    Ran Out of Food in the Last Year: Never true  Transportation Needs: No Transportation Needs (03/26/2022)   PRAPARE - Administrator, Civil Service (Medical): No    Lack of Transportation (Non-Medical): No  Physical Activity: Not on file  Stress: Not on file  Social Connections: Not on file  Intimate Partner Violence: Not At Risk (03/26/2022)   Humiliation, Afraid, Rape, and Kick questionnaire    Fear of Current or Ex-Partner: No    Emotionally Abused: No    Physically Abused: No    Sexually Abused: No     Allergies  Allergen Reactions   Lipitor [Atorvastatin] Other (See Comments)    Severe myalgias   Crestor [Rosuvastatin] Other (See Comments)    Myalgias    Norco [Hydrocodone-Acetaminophen] Hives     Outpatient Medications Prior to Visit  Medication Sig Dispense Refill   albuterol (PROVENTIL HFA;VENTOLIN HFA) 108 (90 BASE) MCG/ACT inhaler Inhale 2 puffs into the lungs every 6 (six) hours as needed for wheezing. Reported on 05/04/2015     allopurinol (ZYLOPRIM) 100 MG tablet Take 100 mg by mouth in the morning.     ALPRAZolam (XANAX) 0.25 MG tablet Take 0.25 mg by mouth daily as needed for anxiety.     apixaban (ELIQUIS) 5 MG TABS tablet Take 1 tablet (5 mg total) by mouth 2 (two) times daily. 180 tablet 1   benzonatate (TESSALON) 100 MG capsule Take 1 capsule (100 mg total) by mouth 2 (two) times daily. 20 capsule 0   Budeson-Glycopyrrol-Formoterol (BREZTRI AEROSPHERE) 160-9-4.8 MCG/ACT AERO Inhale 2 puffs into the lungs in the  morning and at bedtime. 10.7 g 11   dextromethorphan (DELSYM) 30 MG/5ML liquid Take 5 mLs (30 mg total) by mouth 2 (two) times daily as needed for cough. 89 mL 0   docusate sodium (COLACE) 100 MG capsule Take 1 capsule (100 mg total) by mouth 2 (two) times daily. 10 capsule 0   feeding supplement (ENSURE ENLIVE / ENSURE PLUS) LIQD Take 237 mLs by mouth 2 (two) times daily between meals. 237 mL 12   finasteride (PROSCAR) 5 MG tablet Take 5 mg by  mouth in the morning. Reported on 05/04/2015     Fluorouracil 5 % SOLN Apply 1 application  topically daily.     guaiFENesin (MUCINEX) 600 MG 12 hr tablet Take 2 tablets (1,200 mg total) by mouth 2 (two) times daily. 30 tablet 1   ipratropium-albuterol (DUONEB) 0.5-2.5 (3) MG/3ML SOLN Take 3 mLs by nebulization every 6 (six) hours. 360 mL 11   isosorbide mononitrate (IMDUR) 30 MG 24 hr tablet Take 1 tablet (30 mg total) by mouth every evening. Take after 5pm. (Patient taking differently: Take 30 mg by mouth every evening.) 90 tablet 3   metoprolol tartrate (LOPRESSOR) 25 MG tablet TAKE 1 TABLET TWICE DAILY (Patient taking differently: Take 25 mg by mouth 2 (two) times daily.) 180 tablet 1   nitroGLYCERIN (NITROSTAT) 0.4 MG SL tablet Place 1 tablet (0.4 mg total) under the tongue every 5 (five) minutes as needed. Reported on 05/04/2015 25 tablet 3   Phenylephrine-Acetaminophen (TYLENOL SINUS+HEADACHE PO) Take 1 tablet by mouth 2 (two) times daily as needed (sinus pressure/pain).     potassium chloride (KLOR-CON) 10 MEQ tablet Take 10 mEq by mouth in the morning.     pravastatin (PRAVACHOL) 20 MG tablet Take 1 tablet (20 mg total) by mouth every evening. 90 tablet 3   No facility-administered medications prior to visit.   Review of Systems  Constitutional:  Negative for chills, fever, malaise/fatigue and weight loss.  HENT:  Negative for congestion, sinus pain and sore throat.   Eyes: Negative.   Respiratory:  Positive for cough, sputum production, shortness  of breath and wheezing. Negative for hemoptysis.   Cardiovascular:  Negative for chest pain, palpitations, orthopnea, claudication and leg swelling.  Gastrointestinal:  Negative for abdominal pain, heartburn, nausea and vomiting.  Genitourinary: Negative.   Musculoskeletal:  Negative for joint pain and myalgias.  Skin:  Negative for rash.  Neurological:  Negative for weakness.  Endo/Heme/Allergies: Negative.   Psychiatric/Behavioral: Negative.      Objective:   Vitals:   04/25/22 0934  BP: 118/80  Pulse: (!) 58  SpO2: 95%  Weight: 220 lb (99.8 kg)  Height: 5\' 9"  (1.753 m)   Physical Exam Constitutional:      General: He is not in acute distress. HENT:     Head: Normocephalic and atraumatic.  Eyes:     Conjunctiva/sclera: Conjunctivae normal.  Cardiovascular:     Rate and Rhythm: Normal rate and regular rhythm.     Pulses: Normal pulses.     Heart sounds: Normal heart sounds. No murmur heard. Pulmonary:     Effort: No respiratory distress.     Breath sounds: Decreased air movement present. Decreased breath sounds present. No wheezing or rhonchi.  Musculoskeletal:     Right lower leg: No edema.     Left lower leg: No edema.  Skin:    General: Skin is warm and dry.  Neurological:     General: No focal deficit present.     Mental Status: He is alert.  Psychiatric:        Mood and Affect: Mood normal.        Behavior: Behavior normal.        Thought Content: Thought content normal.        Judgment: Judgment normal.     CBC    Component Value Date/Time   WBC 12.5 (H) 03/26/2022 0311   RBC 5.15 03/26/2022 0311   HGB 14.9 03/26/2022 0311   HGB 15.9 01/30/2021 0000   HCT 45.4  03/26/2022 0311   HCT 46.1 01/30/2021 0000   PLT 196 03/26/2022 0311   PLT 176 01/30/2021 0000   MCV 88.2 03/26/2022 0311   MCV 87 01/30/2021 0000   MCH 28.9 03/26/2022 0311   MCHC 32.8 03/26/2022 0311   RDW 13.8 03/26/2022 0311   RDW 12.9 01/30/2021 0000   LYMPHSABS 1.3 03/24/2022 2033    MONOABS 1.1 (H) 03/24/2022 2033   EOSABS 0.5 03/24/2022 2033   BASOSABS 0.1 03/24/2022 2033   Chest imaging: CT Chest 01/12/22 1. The left lower lobe nodule of concern that was new on prior exam has resolved in the interim. No residual nodularity or airspace disease. This is consistent with infectious or inflammatory process, and needs no specific imaging follow-up. 2. The 3-4 mm nodules in the superior segment of the right lower lobe on prior exam have resolved in the interim, also consistent with infectious or inflammatory process needing no further follow-up. 3. No new pulmonary nodules or nodules of concern. No pulmonary nodule follow-up is needed. 4. Mild emphysema and bronchial thickening. 5. Aortic atherosclerosis and coronary artery calcifications.  CXR 10/29/21 No active cardiopulmonary disease  CT Chest 08/01/21 1. Nodule noted in the right upper lobe on the prior CT has mostly resolved consistent near complete resolution of an inflammatory nodule, excluding malignancy. 2. New irregular nodule, abutting the posterior pleural margin, left lower lobe, 9 mm. Additional 3-4 mm new nodules in the superior segment of the right lower lobe. New tiny ill-defined irregular opacity in the right upper lobe image 39, series 7. 3. Overall findings are consistent with inflammation, with waxing and waning nodules.   CXR 12/13/20 The heart and mediastinal contours are unchanged. Aortic calcification. No focal consolidation. No pulmonary edema. No pleural effusion. No pneumothorax. No acute osseous abnormality.  CT Chest 2018 Hyperinflation of the lungs consistent with COPD.   Minimal peribronchial airspace opacities in bilateral lower lobes may represent atelectasis versus peribronchial airspace consolidation.   PFT:     No data to display         2016: FEV1/FVC 58, FEV1 38%, FVC 50%  Echo 12/2016: LVEF 55-60%. Grade I diastolic dysfunction. RV size and function is  normal.   Assessment & Plan:   Asthma-COPD overlap syndrome - Plan: Ambulatory Referral for DME  Discussion: Lazlo Tunney is an 82 year old male, former smoker with CAD, CKD stage III, GERD, hypertension, chronic hypoxemic respiratory failure due to emphysema who returns to pulmonary clinic for asthma-COPD overlap.  He has frequent exacerbations of his breathing with elevated eosinophil level of 500 on 03/24/22. He has required frequent steroid tapers over the past year.  We will schedule him to discuss fasenra therapy with our pharmacy team.   He is to continue breztri 2 puffs twice daily and duonebs as needed.   He is to continue supplemental oxygen for respiratory failure.   Follow-up in 3 months.  Mitchell Jackson, MD [facility] Office: [phone]   Current Outpatient Medications:    albuterol (PROVENTIL HFA;VENTOLIN HFA) 108 (90 BASE) MCG/ACT inhaler, Inhale 2 puffs into the lungs every 6 (six) hours as needed for wheezing. Reported on 05/04/2015, Disp: , Rfl:    allopurinol (ZYLOPRIM) 100 MG tablet, Take 100 mg by mouth in the morning., Disp: , Rfl:    ALPRAZolam (XANAX) 0.25 MG tablet, Take 0.25 mg by mouth daily as needed for anxiety., Disp: , Rfl:    apixaban (ELIQUIS) 5 MG TABS tablet, Take 1 tablet (5 mg  total) by mouth 2 (two) times daily., Disp: 180 tablet, Rfl: 1   benzonatate (TESSALON) 100 MG capsule, Take 1 capsule (100 mg total) by mouth 2 (two) times daily., Disp: 20 capsule, Rfl: 0   Budeson-Glycopyrrol-Formoterol (BREZTRI AEROSPHERE) 160-9-4.8 MCG/ACT AERO, Inhale 2 puffs into the lungs in the morning and at bedtime., Disp: 10.7 g, Rfl: 11   dextromethorphan (DELSYM) 30 MG/5ML liquid, Take 5 mLs (30 mg total) by mouth 2 (two) times daily as needed for cough., Disp: 89 mL, Rfl: 0   docusate sodium (COLACE) 100 MG capsule, Take 1 capsule (100 mg total) by mouth 2 (two) times daily., Disp: 10 capsule, Rfl: 0   feeding supplement (ENSURE ENLIVE  / ENSURE PLUS) LIQD, Take 237 mLs by mouth 2 (two) times daily between meals., Disp: 237 mL, Rfl: 12   finasteride (PROSCAR) 5 MG tablet, Take 5 mg by mouth in the morning. Reported on 05/04/2015, Disp: , Rfl:    Fluorouracil 5 % SOLN, Apply 1 application  topically daily., Disp: , Rfl:    guaiFENesin (MUCINEX) 600 MG 12 hr tablet, Take 2 tablets (1,200 mg total) by mouth 2 (two) times daily., Disp: 30 tablet, Rfl: 1   ipratropium-albuterol (DUONEB) 0.5-2.5 (3) MG/3ML SOLN, Take 3 mLs by nebulization every 6 (six) hours., Disp: 360 mL, Rfl: 11   isosorbide mononitrate (IMDUR) 30 MG 24 hr tablet, Take 1 tablet (30 mg total) by mouth every evening. Take after 5pm. (Patient taking differently: Take 30 mg by mouth every evening.), Disp: 90 tablet, Rfl: 3   metoprolol tartrate (LOPRESSOR) 25 MG tablet, TAKE 1 TABLET TWICE DAILY (Patient taking differently: Take 25 mg by mouth 2 (two) times daily.), Disp: 180 tablet, Rfl: 1   nitroGLYCERIN (NITROSTAT) 0.4 MG SL tablet, Place 1 tablet (0.4 mg total) under the tongue every 5 (five) minutes as needed. Reported on 05/04/2015, Disp: 25 tablet, Rfl: 3   Phenylephrine-Acetaminophen (TYLENOL SINUS+HEADACHE PO), Take 1 tablet by mouth 2 (two) times daily as needed (sinus pressure/pain)., Disp: , Rfl:    potassium chloride (KLOR-CON) 10 MEQ tablet, Take 10 mEq by mouth in the morning., Disp: , Rfl:    pravastatin (PRAVACHOL) 20 MG tablet, Take 1 tablet (20 mg total) by mouth every evening., Disp: 90 tablet, Rfl: 3

## 2022-04-25 NOTE — Patient Instructions (Addendum)
We will refer you to our pharmacy team to work on getting started on Fasenra, an injectable medicine for your asthma-COPD.  Continue breztri inhaler, 2 puffs twice daily with spacer mask - rinse mouth out after each use  Continue duoneb nebulizer treatments every 6 hours as needed with flutter valve  Follow up in 3 months

## 2022-05-05 DIAGNOSIS — J449 Chronic obstructive pulmonary disease, unspecified: Secondary | ICD-10-CM | POA: Diagnosis not present

## 2022-05-05 DIAGNOSIS — I509 Heart failure, unspecified: Secondary | ICD-10-CM | POA: Diagnosis not present

## 2022-05-05 DIAGNOSIS — I10 Essential (primary) hypertension: Secondary | ICD-10-CM | POA: Diagnosis not present

## 2022-05-08 ENCOUNTER — Other Ambulatory Visit: Payer: Self-pay

## 2022-05-08 DIAGNOSIS — I484 Atypical atrial flutter: Secondary | ICD-10-CM

## 2022-05-08 MED ORDER — APIXABAN 5 MG PO TABS: 5.0000 mg | ORAL_TABLET | Freq: Two times a day (BID) | ORAL | 3 refills | Status: DC

## 2022-05-08 NOTE — Telephone Encounter (Signed)
Prescription refill request for Eliquis received. Indication: Aflutter Last office visit: 11/17/21 Tamala Julian)  Scr: 1.27 (03/29/22)  Age: 82 Weight: 99.8kg  Appropriate dose and refill sent to requested pharmacy.

## 2022-05-31 DIAGNOSIS — M1711 Unilateral primary osteoarthritis, right knee: Secondary | ICD-10-CM | POA: Diagnosis not present

## 2022-05-31 DIAGNOSIS — M179 Osteoarthritis of knee, unspecified: Secondary | ICD-10-CM | POA: Diagnosis not present

## 2022-05-31 DIAGNOSIS — Z9981 Dependence on supplemental oxygen: Secondary | ICD-10-CM | POA: Diagnosis not present

## 2022-05-31 DIAGNOSIS — N183 Chronic kidney disease, stage 3 unspecified: Secondary | ICD-10-CM | POA: Diagnosis not present

## 2022-05-31 DIAGNOSIS — J449 Chronic obstructive pulmonary disease, unspecified: Secondary | ICD-10-CM | POA: Diagnosis not present

## 2022-05-31 DIAGNOSIS — I5032 Chronic diastolic (congestive) heart failure: Secondary | ICD-10-CM | POA: Diagnosis not present

## 2022-05-31 DIAGNOSIS — D6869 Other thrombophilia: Secondary | ICD-10-CM | POA: Diagnosis not present

## 2022-06-20 DIAGNOSIS — M25661 Stiffness of right knee, not elsewhere classified: Secondary | ICD-10-CM | POA: Diagnosis not present

## 2022-06-20 DIAGNOSIS — R262 Difficulty in walking, not elsewhere classified: Secondary | ICD-10-CM | POA: Diagnosis not present

## 2022-06-20 DIAGNOSIS — M1711 Unilateral primary osteoarthritis, right knee: Secondary | ICD-10-CM | POA: Diagnosis not present

## 2022-06-20 DIAGNOSIS — M25561 Pain in right knee: Secondary | ICD-10-CM | POA: Diagnosis not present

## 2022-06-27 DIAGNOSIS — M25661 Stiffness of right knee, not elsewhere classified: Secondary | ICD-10-CM | POA: Diagnosis not present

## 2022-06-27 DIAGNOSIS — M1711 Unilateral primary osteoarthritis, right knee: Secondary | ICD-10-CM | POA: Diagnosis not present

## 2022-06-27 DIAGNOSIS — M25561 Pain in right knee: Secondary | ICD-10-CM | POA: Diagnosis not present

## 2022-06-27 DIAGNOSIS — R262 Difficulty in walking, not elsewhere classified: Secondary | ICD-10-CM | POA: Diagnosis not present

## 2022-07-05 DIAGNOSIS — M1711 Unilateral primary osteoarthritis, right knee: Secondary | ICD-10-CM | POA: Diagnosis not present

## 2022-07-05 DIAGNOSIS — J449 Chronic obstructive pulmonary disease, unspecified: Secondary | ICD-10-CM | POA: Diagnosis not present

## 2022-07-11 DIAGNOSIS — R262 Difficulty in walking, not elsewhere classified: Secondary | ICD-10-CM | POA: Diagnosis not present

## 2022-07-11 DIAGNOSIS — M25661 Stiffness of right knee, not elsewhere classified: Secondary | ICD-10-CM | POA: Diagnosis not present

## 2022-07-11 DIAGNOSIS — M25561 Pain in right knee: Secondary | ICD-10-CM | POA: Diagnosis not present

## 2022-07-11 DIAGNOSIS — M1711 Unilateral primary osteoarthritis, right knee: Secondary | ICD-10-CM | POA: Diagnosis not present

## 2022-07-19 DIAGNOSIS — M1711 Unilateral primary osteoarthritis, right knee: Secondary | ICD-10-CM | POA: Diagnosis not present

## 2022-07-19 DIAGNOSIS — R262 Difficulty in walking, not elsewhere classified: Secondary | ICD-10-CM | POA: Diagnosis not present

## 2022-07-19 DIAGNOSIS — M25661 Stiffness of right knee, not elsewhere classified: Secondary | ICD-10-CM | POA: Diagnosis not present

## 2022-07-19 DIAGNOSIS — M25561 Pain in right knee: Secondary | ICD-10-CM | POA: Diagnosis not present

## 2022-09-17 DIAGNOSIS — Z6832 Body mass index (BMI) 32.0-32.9, adult: Secondary | ICD-10-CM | POA: Diagnosis not present

## 2022-09-17 DIAGNOSIS — M109 Gout, unspecified: Secondary | ICD-10-CM | POA: Diagnosis not present

## 2022-09-17 DIAGNOSIS — L282 Other prurigo: Secondary | ICD-10-CM | POA: Diagnosis not present

## 2022-09-17 DIAGNOSIS — M7989 Other specified soft tissue disorders: Secondary | ICD-10-CM | POA: Diagnosis not present

## 2022-09-25 ENCOUNTER — Ambulatory Visit: Payer: Medicare PPO | Admitting: Pulmonary Disease

## 2022-09-26 ENCOUNTER — Ambulatory Visit (INDEPENDENT_AMBULATORY_CARE_PROVIDER_SITE_OTHER): Payer: Medicare PPO | Admitting: Pulmonary Disease

## 2022-09-26 ENCOUNTER — Encounter: Payer: Self-pay | Admitting: Pulmonary Disease

## 2022-09-26 VITALS — BP 118/70 | HR 51 | Ht 69.0 in | Wt 209.0 lb

## 2022-09-26 DIAGNOSIS — J4489 Other specified chronic obstructive pulmonary disease: Secondary | ICD-10-CM | POA: Diagnosis not present

## 2022-09-26 DIAGNOSIS — J9612 Chronic respiratory failure with hypercapnia: Secondary | ICD-10-CM

## 2022-09-26 DIAGNOSIS — J9611 Chronic respiratory failure with hypoxia: Secondary | ICD-10-CM | POA: Diagnosis not present

## 2022-09-26 MED ORDER — BREZTRI AEROSPHERE 160-9-4.8 MCG/ACT IN AERO: 2.0000 | INHALATION_SPRAY | Freq: Two times a day (BID) | RESPIRATORY_TRACT | 0 refills | Status: DC

## 2022-09-26 NOTE — Patient Instructions (Addendum)
We will give you breztri samples today  Continue duoneb nebulizer treatment twice daily  Continue breztri 2 puffs twice daily - rinse mouth out after each use  Follow up in 6 months

## 2022-09-26 NOTE — Progress Notes (Signed)
Synopsis: Referred in September 2022 for COPD by Evangeline Gula, PA  Subjective:   PATIENT ID: Mitchell [name] GENDER: male DOB: 08/19/1940, MRN: [id number]  HPI  Chief Complaint  Patient presents with   Follow-up    3 mo f/u for copd. States his breathing has been stable since last visit. Mitchell [name] is still working for him.    Mitchell [name] is an 82 year old male, former smoker with CAD, CKD stage III, GERD, hypertension, chronic hypoxemic respiratory failure due to emphysema who returns to pulmonary clinic for COPD.  He has stayed out of the hospital since last visit. He is using duonebs twice daily followed by his 2 puffs of breztri. He recently bought a motorized scooter and has been able to get out of his apartment more. He continues to have significant dyspnea with exertion.  OV 04/25/22 Patient was admitted 12/10 to 12/14 COPD with acute exacerbation.   He is using breztri 2 puffs twice daily with a spacer mask and is using duonebs throughout the day with flutter valve therapy. He feels less congested since being in the hospital. He continues to have significant dyspnea.   OV 02/21/22 CT Chest 01/12/22 shows resolution of pulmonary nodules in question from prior scan.   He has been doing ok since last visit on breztri 2 puffs twice daily and PRN albuterol nebulizer treatments about 3 times per day. He reports having a harder time breathing over the last week with increased cough/sputum production.   He is very limited in his functional abilities and is need more help at home.   OV 11/16/21 He reports increased shortness of breath since yesterday. He feels he is struggling to breath at this time.   He was treated in ER 10/29/21 for COPD exacerbation.   OV 08/04/21 He was seen by Buelah Manis, NP on 05/19/21 for hospital follow up after being admitted for COPD exacerbation. His trelegy was increased to 200 at last visit. A new 11-26mm RUL nodule was noted on CTA on 05/02/21. Repeat CT  Chest 08/01/21 showed mostly resolved RUL nodule. New irregular nodule of the left lower lobe abutting the posterior pleural margin.  He reports he feels he is getting worse. He is having a lot of trouble breathing. He had covid 2 months ago.   Using nebulizer machine 2-3 times per day. Using 2-3L of oxygen day/night. He has been out of trelegy ellipta 200 over the past 2 weeks.   OV 01/13/2021 He had an ER visit 12/13/20 for dyspnea and was treated for COPD exacerbation. He has been doing ok since last month but continues to have bad days with his breathing throughout the week with increased cough, wheezing and dyspnea.   He is using dulera 100-50mcg 2 puffs twice daily and as needed albuterol throughout the day. He has limited eyesight and reports some difficulty using his inhalers.   He is a former smoker, smoked a pipe from [place] to 2013. He also reports a significant amount of second hand smoke throughout his life. He worked as a Naval architect.   He is on 2.5L of supplemental oxygen at home which he uses throughout the day and night.    Past Medical History:  Diagnosis Date   Anxiety    BPH (benign prostatic hypertrophy)    CAD (coronary artery disease) 2007   with LAD/Diag CBPTCA and kissing    CKD (chronic kidney disease), stage III (HCC)    Mitchell [name] 01/04/2017   COPD (chronic  obstructive pulmonary disease) (HCC)    "maybe" (02/14/2012)   Depression    GERD (gastroesophageal reflux disease)    Hyperlipidemia    Hypertension    Hypoxemia    Myocardial infarction (HCC) 01/22/2012   NSTEMI with DES to RSA (PDA)   On home oxygen therapy    Shortness of breath    "walking and laying down sometimes" (02/14/2012)     Family History  Problem Relation Age of Onset   Heart failure Mother    Heart failure Brother      Social History   Socioeconomic History   Marital status: Single    Spouse name: Not on file   Number of children: 3   Years of education: Not on file   Highest  education level: Not on file  Occupational History   Not on file  Tobacco Use   Smoking status: Former    Types: Pipe    Quit date: 04/28/2011    Years since quitting: 11.4   Smokeless tobacco: Never  Vaping Use   Vaping Use: Never used  Substance and Sexual Activity   Alcohol use: Not Currently    Alcohol/week: 7.0 standard drinks of alcohol    Types: 7 Standard drinks or equivalent per week    Comment: 02/14/2012 "weekends I have 6-7 mixed drinks"     occasional   Drug use: No   Sexual activity: Yes  Other Topics Concern   Not on file  Social History Narrative   Truck driver      Social Determinants of Health   Financial Resource Strain: Not on file  Food Insecurity: No Food Insecurity (03/26/2022)   Hunger Vital Sign    Worried About Running Out of Food in the Last Year: Never true    Ran Out of Food in the Last Year: Never true  Transportation Needs: No Transportation Needs (03/26/2022)   PRAPARE - Administrator, Civil Service (Medical): No    Lack of Transportation (Non-Medical): No  Physical Activity: Not on file  Stress: Not on file  Social Connections: Not on file  Intimate Partner Violence: Not At Risk (03/26/2022)   Humiliation, Afraid, Rape, and Kick questionnaire    Fear of Current or Ex-Partner: No    Emotionally Abused: No    Physically Abused: No    Sexually Abused: No     Allergies  Allergen Reactions   Lipitor [Atorvastatin] Other (See Comments)    Severe myalgias   Crestor [Rosuvastatin] Other (See Comments)    Myalgias    Norco [Hydrocodone-Acetaminophen] Hives     Outpatient Medications Prior to Visit  Medication Sig Dispense Refill   albuterol (PROVENTIL HFA;VENTOLIN HFA) 108 (90 BASE) MCG/ACT inhaler Inhale 2 puffs into the lungs every 6 (six) hours as needed for wheezing. Reported on 05/04/2015     allopurinol (ZYLOPRIM) 100 MG tablet Take 100 mg by mouth in the morning.     ALPRAZolam (XANAX) 0.25 MG tablet Take 0.25 mg by  mouth daily as needed for anxiety.     apixaban (ELIQUIS) 5 MG TABS tablet Take 1 tablet (5 mg total) by mouth 2 (two) times daily. 180 tablet 3   benzonatate (TESSALON) 100 MG capsule Take 1 capsule (100 mg total) by mouth 2 (two) times daily. 20 capsule 0   Budeson-Glycopyrrol-Formoterol (BREZTRI AEROSPHERE) 160-9-4.8 MCG/ACT AERO Inhale 2 puffs into the lungs in the morning and at bedtime. 10.7 g 11   dextromethorphan (DELSYM) 30 MG/5ML liquid Take 5 mLs (  30 mg total) by mouth 2 (two) times daily as needed for cough. 89 mL 0   docusate sodium (COLACE) 100 MG capsule Take 1 capsule (100 mg total) by mouth 2 (two) times daily. 10 capsule 0   feeding supplement (ENSURE ENLIVE / ENSURE PLUS) LIQD Take 237 mLs by mouth 2 (two) times daily between meals. 237 mL 12   finasteride (PROSCAR) 5 MG tablet Take 5 mg by mouth in the morning. Reported on 05/04/2015     Fluorouracil 5 % SOLN Apply 1 application  topically daily.     guaiFENesin (MUCINEX) 600 MG 12 hr tablet Take 2 tablets (1,200 mg total) by mouth 2 (two) times daily. 30 tablet 1   ipratropium-albuterol (DUONEB) 0.5-2.5 (3) MG/3ML SOLN Take 3 mLs by nebulization every 6 (six) hours. 360 mL 11   isosorbide mononitrate (IMDUR) 30 MG 24 hr tablet Take 1 tablet (30 mg total) by mouth every evening. Take after 5pm. (Patient taking differently: Take 30 mg by mouth every evening.) 90 tablet 3   metoprolol tartrate (LOPRESSOR) 25 MG tablet TAKE 1 TABLET TWICE DAILY (Patient taking differently: Take 25 mg by mouth 2 (two) times daily.) 180 tablet 1   nitroGLYCERIN (NITROSTAT) 0.4 MG SL tablet Place 1 tablet (0.4 mg total) under the tongue every 5 (five) minutes as needed. Reported on 05/04/2015 25 tablet 3   Phenylephrine-Acetaminophen (TYLENOL SINUS+HEADACHE PO) Take 1 tablet by mouth 2 (two) times daily as needed (sinus pressure/pain).     potassium chloride (KLOR-CON) 10 MEQ tablet Take 10 mEq by mouth in the morning.     pravastatin (PRAVACHOL) 20 MG  tablet Take 1 tablet (20 mg total) by mouth every evening. 90 tablet 3   No facility-administered medications prior to visit.   Review of Systems  Constitutional:  Negative for chills, fever, malaise/fatigue and weight loss.  HENT:  Negative for congestion, sinus pain and sore throat.   Eyes: Negative.   Respiratory:  Positive for cough, sputum production and shortness of breath. Negative for hemoptysis and wheezing.   Cardiovascular:  Negative for chest pain, palpitations, orthopnea, claudication and leg swelling.  Gastrointestinal:  Negative for abdominal pain, heartburn, nausea and vomiting.  Genitourinary: Negative.   Musculoskeletal:  Negative for joint pain and myalgias.    Objective:   Vitals:   09/26/22 1050  BP: 118/70  Pulse: (!) 51  SpO2: 96%  Weight: 209 lb (94.8 kg)  Height: 5\' 9"  (1.753 m)   Physical Exam Constitutional:      General: He is not in acute distress. HENT:     Head: Normocephalic and atraumatic.  Eyes:     Conjunctiva/sclera: Conjunctivae normal.  Cardiovascular:     Rate and Rhythm: Normal rate and regular rhythm.     Pulses: Normal pulses.     Heart sounds: Normal heart sounds. No murmur heard. Pulmonary:     Effort: No respiratory distress.     Breath sounds: Decreased air movement present. Decreased breath sounds and wheezing (mild scattered) present. No rhonchi.  Musculoskeletal:     Right lower leg: No edema.     Left lower leg: No edema.  Skin:    General: Skin is warm and dry.  Neurological:     General: No focal deficit present.     Mental Status: He is alert.     CBC    Component Value Date/Time   WBC 12.5 (H) 03/26/2022 0311   RBC 5.15 03/26/2022 0311   HGB 14.9 03/26/2022 0311  HGB 15.9 01/30/2021 0000   HCT 45.4 03/26/2022 0311   HCT 46.1 01/30/2021 0000   PLT 196 03/26/2022 0311   PLT 176 01/30/2021 0000   MCV 88.2 03/26/2022 0311   MCV 87 01/30/2021 0000   MCH 28.9 03/26/2022 0311   MCHC 32.8 03/26/2022 0311    RDW 13.8 03/26/2022 0311   RDW 12.9 01/30/2021 0000   LYMPHSABS 1.3 03/24/2022 2033   MONOABS 1.1 (H) 03/24/2022 2033   EOSABS 0.5 03/24/2022 2033   BASOSABS 0.1 03/24/2022 2033   Chest imaging: CT Chest 01/12/22 1. The left lower lobe nodule of concern that was new on prior exam has resolved in the interim. No residual nodularity or airspace disease. This is consistent with infectious or inflammatory process, and needs no specific imaging follow-up. 2. The 3-4 mm nodules in the superior segment of the right lower lobe on prior exam have resolved in the interim, also consistent with infectious or inflammatory process needing no further follow-up. 3. No new pulmonary nodules or nodules of concern. No pulmonary nodule follow-up is needed. 4. Mild emphysema and bronchial thickening. 5. Aortic atherosclerosis and coronary artery calcifications.  CXR 10/29/21 No active cardiopulmonary disease  CT Chest 08/01/21 1. Nodule noted in the right upper lobe on the prior CT has mostly resolved consistent near complete resolution of an inflammatory nodule, excluding malignancy. 2. New irregular nodule, abutting the posterior pleural margin, left lower lobe, 9 mm. Additional 3-4 mm new nodules in the superior segment of the right lower lobe. New tiny ill-defined irregular opacity in the right upper lobe image 39, series 7. 3. Overall findings are consistent with inflammation, with waxing and waning nodules.   CXR 12/13/20 The heart and mediastinal contours are unchanged. Aortic calcification. No focal consolidation. No pulmonary edema. No pleural effusion. No pneumothorax. No acute osseous abnormality.  CT Chest 2018 Hyperinflation of the lungs consistent with COPD.   Minimal peribronchial airspace opacities in bilateral lower lobes may represent atelectasis versus peribronchial airspace consolidation.   PFT:     No data to display         2016: FEV1/FVC 58, FEV1 38%, FVC  50%  Echo 12/2016: LVEF 55-60%. Grade I diastolic dysfunction. RV size and function is normal.   Assessment & Plan:   Chronic respiratory failure with hypoxia and hypercapnia (HCC)  Asthma-COPD overlap syndrome  Discussion: Mitchell [name] is an 82 year old male, former smoker with CAD, CKD stage III, GERD, hypertension, chronic hypoxemic respiratory failure due to emphysema who returns to pulmonary clinic for asthma-COPD overlap.  He has been doing well since last visit.   He is to continue breztri 2 puffs twice daily and duonebs as needed.   He is to continue supplemental oxygen for respiratory failure.   Follow-up in 6 months.  Melody Comas, MD  Pulmonary & Critical Care Office: [phone]   Current Outpatient Medications:    Budeson-Glycopyrrol-Formoterol (BREZTRI AEROSPHERE) 160-9-4.8 MCG/ACT AERO, Inhale 2 puffs into the lungs in the morning and at bedtime., Disp: 4 each, Rfl: 0   albuterol (PROVENTIL HFA;VENTOLIN HFA) 108 (90 BASE) MCG/ACT inhaler, Inhale 2 puffs into the lungs every 6 (six) hours as needed for wheezing. Reported on 05/04/2015, Disp: , Rfl:    allopurinol (ZYLOPRIM) 100 MG tablet, Take 100 mg by mouth in the morning., Disp: , Rfl:    ALPRAZolam (XANAX) 0.25 MG tablet, Take 0.25 mg by mouth daily as needed for anxiety., Disp: , Rfl:    apixaban (ELIQUIS) 5 MG TABS  tablet, Take 1 tablet (5 mg total) by mouth 2 (two) times daily., Disp: 180 tablet, Rfl: 3   benzonatate (TESSALON) 100 MG capsule, Take 1 capsule (100 mg total) by mouth 2 (two) times daily., Disp: 20 capsule, Rfl: 0   Budeson-Glycopyrrol-Formoterol (BREZTRI AEROSPHERE) 160-9-4.8 MCG/ACT AERO, Inhale 2 puffs into the lungs in the morning and at bedtime., Disp: 10.7 g, Rfl: 11   dextromethorphan (DELSYM) 30 MG/5ML liquid, Take 5 mLs (30 mg total) by mouth 2 (two) times daily as needed for cough., Disp: 89 mL, Rfl: 0   docusate sodium (COLACE) 100 MG capsule, Take 1 capsule (100 mg total) by  mouth 2 (two) times daily., Disp: 10 capsule, Rfl: 0   feeding supplement (ENSURE ENLIVE / ENSURE PLUS) LIQD, Take 237 mLs by mouth 2 (two) times daily between meals., Disp: 237 mL, Rfl: 12   finasteride (PROSCAR) 5 MG tablet, Take 5 mg by mouth in the morning. Reported on 05/04/2015, Disp: , Rfl:    Fluorouracil 5 % SOLN, Apply 1 application  topically daily., Disp: , Rfl:    guaiFENesin (MUCINEX) 600 MG 12 hr tablet, Take 2 tablets (1,200 mg total) by mouth 2 (two) times daily., Disp: 30 tablet, Rfl: 1   ipratropium-albuterol (DUONEB) 0.5-2.5 (3) MG/3ML SOLN, Take 3 mLs by nebulization every 6 (six) hours., Disp: 360 mL, Rfl: 11   isosorbide mononitrate (IMDUR) 30 MG 24 hr tablet, Take 1 tablet (30 mg total) by mouth every evening. Take after 5pm. (Patient taking differently: Take 30 mg by mouth every evening.), Disp: 90 tablet, Rfl: 3   metoprolol tartrate (LOPRESSOR) 25 MG tablet, TAKE 1 TABLET TWICE DAILY (Patient taking differently: Take 25 mg by mouth 2 (two) times daily.), Disp: 180 tablet, Rfl: 1   nitroGLYCERIN (NITROSTAT) 0.4 MG SL tablet, Place 1 tablet (0.4 mg total) under the tongue every 5 (five) minutes as needed. Reported on 05/04/2015, Disp: 25 tablet, Rfl: 3   Phenylephrine-Acetaminophen (TYLENOL SINUS+HEADACHE PO), Take 1 tablet by mouth 2 (two) times daily as needed (sinus pressure/pain)., Disp: , Rfl:    potassium chloride (KLOR-CON) 10 MEQ tablet, Take 10 mEq by mouth in the morning., Disp: , Rfl:    pravastatin (PRAVACHOL) 20 MG tablet, Take 1 tablet (20 mg total) by mouth every evening., Disp: 90 tablet, Rfl: 3

## 2022-10-11 NOTE — Addendum Note (Signed)
Addended by: Maurene Capes on: 10/11/2022 09:10 AM   Modules accepted: Orders

## 2022-11-12 ENCOUNTER — Telehealth: Payer: Self-pay | Admitting: Pulmonary Disease

## 2022-11-12 DIAGNOSIS — J4489 Other specified chronic obstructive pulmonary disease: Secondary | ICD-10-CM

## 2022-11-12 MED ORDER — BREZTRI AEROSPHERE 160-9-4.8 MCG/ACT IN AERO: 2.0000 | INHALATION_SPRAY | Freq: Two times a day (BID) | RESPIRATORY_TRACT | Status: DC

## 2022-11-12 NOTE — Telephone Encounter (Signed)
Spoke with the pt  He states that his neb machine is not working well and making a very loud sound  Needs new machine and also requested samples of Breztri  I have placed order for machine and placed samples up front  Nothing further needed

## 2022-11-12 NOTE — Telephone Encounter (Signed)
Pt. Calling in needing a new script for neb machine and he really needs it done today due to he has treatments 3-4 times a day

## 2022-11-14 DIAGNOSIS — J449 Chronic obstructive pulmonary disease, unspecified: Secondary | ICD-10-CM | POA: Diagnosis not present

## 2022-12-15 DIAGNOSIS — J449 Chronic obstructive pulmonary disease, unspecified: Secondary | ICD-10-CM | POA: Diagnosis not present

## 2022-12-27 ENCOUNTER — Telehealth: Payer: Self-pay | Admitting: Pulmonary Disease

## 2022-12-27 DIAGNOSIS — J4489 Other specified chronic obstructive pulmonary disease: Secondary | ICD-10-CM

## 2022-12-27 NOTE — Telephone Encounter (Signed)
Patient states having symptom of shortness of breath. Pharmacy is [facility] [address] Patient phone number is [phone]. Also can call [name] former wife phone number is [phone].

## 2022-12-28 MED ORDER — PREDNISONE 10 MG PO TABS: ORAL_TABLET | ORAL | 0 refills | Status: AC

## 2022-12-28 MED ORDER — AZITHROMYCIN 250 MG PO TABS: ORAL_TABLET | ORAL | 0 refills | Status: DC

## 2022-12-28 NOTE — Telephone Encounter (Signed)
I have sent in steroids and Zpak for his breathing. If he continues to have trouble swallowing he will need a swallow evaluation by speech therapy.  Dr. Francine Graven

## 2022-12-28 NOTE — Telephone Encounter (Signed)
Called and spoke with pt. Pt said when he eats, he is beginning to get choked on food that he eats and when he does get choked, he will become SOB. States this has gotten worse over the past week or two.  Pt said he has been having to overuse his albuterol neb sol. Has been using his oxygen at 2L but feels like he might need to turn it up to 3L. Pt is also using his Breztri inhaler as prescribed.  With what has been happening, pt wants to know what might be able to be recommended. No avail appts with APP or yourself next week.  Dr. Francine Graven, please advise.

## 2022-12-28 NOTE — Telephone Encounter (Signed)
Called and spoke with pt about recommendations. He verbalized understanding nfn

## 2023-01-14 DIAGNOSIS — J449 Chronic obstructive pulmonary disease, unspecified: Secondary | ICD-10-CM | POA: Diagnosis not present

## 2023-01-14 NOTE — Progress Notes (Unsigned)
Cardiology Office Note:  .   Date:  01/14/2023  ID:  Mitchell [name], DOB 04/19/1940, MRN [id number] PCP:  Daisy Floro, MD  Former Cardiology Providers: [facility] Providers Cardiologist:  Tessa Lerner, DO , [facility] (established care 01/14/23) Electrophysiologist:  None  Click to update primary MD,subspecialty MD or APP then REFRESH:1  History of Present Illness: .   Mitchell [name] is a 82 y.o. *** male whose past medical history and cardiovascular risk factors includes: CAD w/ hx of LAD/Diagonal cutting balloon PCI, history of RCA/PDA DES, legally blind, Aortic atherosclerosis, COPD on oxygen therapy, paroxysmal atrial flutter, hx of diastolic heart failure (per EMR), claudication and hypertension.   Formally under the care of Dr. Katrinka Blazing who last saw Mitchell [name] back in August 2023. I am seeing him for the first time to re-establishing care.    ***  Review of Systems: .   ROS  Studies Reviewed:   EKG: The ekg ordered today was personally reviewed by me.  EKG Interpretation Date/Time:    Ventricular Rate:    PR Interval:    QRS Duration:    QT Interval:    QTC Calculation:   R Axis:      Text Interpretation:    Echocardiogram: May 2023: LVEF 55 to 60%, mild LVH, grade 1 diastolic dysfunction, mild hypokinesis of the basal to mid inferior wall, no significant valvular heart disease, estimated RAP 3 mmHg  Stress Testing: NM MYOCAR MULTI W/SPECT W 08/26/2017 Narrative  There was no ST segment deviation noted during stress.  No T wave inversion was noted during stress.  Defect 1: There is a large defect of moderate severity.  This is a low risk study.  Nuclear stress EF: 61%.   Large size, moderate intensity fixed inferior, inferoseptal and septal perfusion defect, likely attenuation artifact with adjacent bowel uptake. LVEF 61% with normal wall motion. This is a low risk study.  Risk Assessment/Calculations:   {Does this patient have ATRIAL  FIBRILLATION?:[id number]} No BP recorded.  {Refresh Note OR Click here to enter BP  :1}***         Labs:       Latest Ref Rng & Units 03/26/2022    3:11 AM 03/24/2022    8:33 PM 10/29/2021   12:37 PM  CBC  WBC 4.0 - 10.5 K/uL 12.5  8.9  7.6   Hemoglobin 13.0 - 17.0 g/dL 69.6  29.5  28.4   Hematocrit 39.0 - 52.0 % 45.4  48.2  47.3   Platelets 150 - 400 K/uL 196  203  166        Latest Ref Rng & Units 03/29/2022    2:33 AM 03/26/2022    3:11 AM 03/24/2022    8:33 PM  BMP  Glucose 70 - 99 mg/dL 132  440  102   BUN 8 - 23 mg/dL 26  23  23    Creatinine 0.61 - 1.24 mg/dL 7.25  3.66  4.40   Sodium 135 - 145 mmol/L 139  137  139   Potassium 3.5 - 5.1 mmol/L 4.8  4.4  4.1   Chloride 98 - 111 mmol/L 106  100  98   CO2 22 - 32 mmol/L 27  27  34   Calcium 8.9 - 10.3 mg/dL 8.4  8.8  9.1       Latest Ref Rng & Units 03/29/2022    2:33 AM 03/26/2022    3:11 AM 03/24/2022    8:33  PM  CMP  Glucose 70 - 99 mg/dL 147  829  562   BUN 8 - 23 mg/dL 26  23  23    Creatinine 0.61 - 1.24 mg/dL 1.30  8.65  7.84   Sodium 135 - 145 mmol/L 139  137  139   Potassium 3.5 - 5.1 mmol/L 4.8  4.4  4.1   Chloride 98 - 111 mmol/L 106  100  98   CO2 22 - 32 mmol/L 27  27  34   Calcium 8.9 - 10.3 mg/dL 8.4  8.8  9.1     Lab Results  Component Value Date   [name] 202 (H) 08/24/2021   HDL 63 08/24/2021   LDLCALC 117 (H) 08/24/2021   TRIG 108 08/24/2021   CHOLHDL 3.2 08/24/2021   No results for input(s): "LIPOA" in the last 8760 hours. No components found for: "NTPROBNP" No results for input(s): "PROBNP" in the last 8760 hours. No results for input(s): "TSH" in the last 8760 hours.  Comp Metabolic Panel June 2024-Care Everywhere Reviewed date:09/20/2022 08:24:55 AM Interpretation: Performing Lab: Notes/Report: Testing Performed at: [facility], [address], [address], [place], [place] [address]  Glucose 91 70-99 mg/dL    BUN 19 5-28 mg/dL    Creatinine 4.13 2.44-0.10 mg/dl    [id number] 56  >40 calc In accordance with recommendations from NKF-ASN Task Force, [facility] has updated its eGFR calc to the 2021 CKD-EDI equation that estimates kidney function without a race variable;Stage 1 > 90 ML/Min plus Albuminuria;Stage 2 60-89 ML/MIN;Stage 3 30-59 ML/MIN;Stage 4 15-29 ML/MIN;Stage 5 <15 ML/MIN  Sodium 141 136-145 mmol/L    Potassium 4.0 3.5-5.5 mmol/L    Chloride 101 98-107 mmol/L    CO2 31 22-32 mmol/L    Anion Gap 12.8 6.0-20.0 mmol/L    Calcium 9.2 8.6-10.3 mg/dL    CA-corrected 3.47 4.25-95.63 mg/dL    Protein, Total 5.8 8.7-5.6 g/dL    Albumin 3.9 4.3-3.2 g/dL    TBIL 0.5 9.5-1.8 mg/dL    ALP 77 84-166 U/L    AST 16 0-39 U/L    ALT 9 0-52 U/L      Physical Exam:   There were no vitals filed for this visit. There is no height or weight on file to calculate BMI. Wt Readings from Last 3 Encounters:  09/26/22 209 lb (94.8 kg)  04/25/22 220 lb (99.8 kg)  02/21/22 221 lb 12.8 oz (100.6 kg)    Physical Exam   Impression & Recommendation(s):  Impression: No diagnosis found.   Recommendation(s):  ***  Continue statin therapy and blood pressure control.  The sensation in his chest may be angina.  Add Imdur 30 mg/day.  He is not ambulatory because of hearing and vision disturbance.  He is advised to use sublingual nitroglycerin if any acute episodes of pain and to report those to [place].  My instincts tell me that an ischemic evaluation is indicated at this time.  A lot of his chest complaints could also be related to pulmonary issues. Continue anticoagulation therapy. Blood pressure control is adequate.  Continue current therapy which includes Lopressor 25 mg twice daily.  We need to monitor therapy and determine if dose reduction is necessary because he reports some heart rates in the high 40s based on his pulse oximeter. Continue Pravachol 20 mg/day. Being seen by pulmonary.  Orders Placed:  No orders of the defined types were placed in this encounter.   As part of  medical decision making results of  the *** were reviewed independently at today's visit.   Final Medication List:   No orders of the defined types were placed in this encounter.   There are no discontinued medications.   Current Outpatient Medications:    albuterol (PROVENTIL HFA;VENTOLIN HFA) 108 (90 BASE) MCG/ACT inhaler, Inhale 2 puffs into the lungs every 6 (six) hours as needed for wheezing. Reported on 05/04/2015, Disp: , Rfl:    allopurinol (ZYLOPRIM) 100 MG tablet, Take 100 mg by mouth in the morning., Disp: , Rfl:    ALPRAZolam (XANAX) 0.25 MG tablet, Take 0.25 mg by mouth daily as needed for anxiety., Disp: , Rfl:    apixaban (ELIQUIS) 5 MG TABS tablet, Take 1 tablet (5 mg total) by mouth 2 (two) times daily., Disp: 180 tablet, Rfl: 3   azithromycin (ZITHROMAX) 250 MG tablet, Take as directed, Disp: 6 tablet, Rfl: 0   Budeson-Glycopyrrol-Formoterol (BREZTRI AEROSPHERE) 160-9-4.8 MCG/ACT AERO, Inhale 2 puffs into the lungs in the morning and at bedtime., Disp: 10.7 g, Rfl: 11   Budeson-Glycopyrrol-Formoterol (BREZTRI AEROSPHERE) 160-9-4.8 MCG/ACT AERO, Inhale 2 puffs into the lungs in the morning and at bedtime., Disp: 4 each, Rfl: 0   Budeson-Glycopyrrol-Formoterol (BREZTRI AEROSPHERE) 160-9-4.8 MCG/ACT AERO, Inhale 2 puffs into the lungs in the morning and at bedtime., Disp: , Rfl:    finasteride (PROSCAR) 5 MG tablet, Take 5 mg by mouth in the morning. Reported on 05/04/2015, Disp: , Rfl:    metoprolol tartrate (LOPRESSOR) 25 MG tablet, TAKE 1 TABLET TWICE DAILY (Patient taking differently: Take 25 mg by mouth 2 (two) times daily.), Disp: 180 tablet, Rfl: 1   Phenylephrine-Acetaminophen (TYLENOL SINUS+HEADACHE PO), Take 1 tablet by mouth 2 (two) times daily as needed (sinus pressure/pain)., Disp: , Rfl:    potassium chloride (KLOR-CON) 10 MEQ tablet, Take 10 mEq by mouth in the morning., Disp: , Rfl:    pravastatin (PRAVACHOL) 20 MG tablet, Take 1 tablet (20 mg total) by mouth every  evening., Disp: 90 tablet, Rfl: 3  Consent:      {Are you ordering a CV Procedure (e.g. stress test, cath, DCCV, TEE, etc)?   Press F2        :[id number]}   Disposition:   No follow-ups on file. or sooner if needed.  His questions and concerns were addressed to his satisfaction. He voices understanding of the recommendations provided during this encounter.    Signed, Tessa Lerner, DO, [facility]  [facility]  [address] [place], [place] [address] [phone] 01/14/2023 10:50 PM

## 2023-01-15 ENCOUNTER — Telehealth: Payer: Self-pay | Admitting: Cardiology

## 2023-01-15 ENCOUNTER — Encounter: Payer: Self-pay | Admitting: Cardiology

## 2023-01-15 ENCOUNTER — Ambulatory Visit: Payer: Medicare PPO | Attending: Cardiology | Admitting: Cardiology

## 2023-01-15 VITALS — BP 138/82 | HR 69 | Resp 16 | Ht 69.0 in | Wt 226.0 lb

## 2023-01-15 DIAGNOSIS — I484 Atypical atrial flutter: Secondary | ICD-10-CM

## 2023-01-15 DIAGNOSIS — J449 Chronic obstructive pulmonary disease, unspecified: Secondary | ICD-10-CM

## 2023-01-15 DIAGNOSIS — I10 Essential (primary) hypertension: Secondary | ICD-10-CM | POA: Diagnosis not present

## 2023-01-15 DIAGNOSIS — I25118 Atherosclerotic heart disease of native coronary artery with other forms of angina pectoris: Secondary | ICD-10-CM | POA: Diagnosis not present

## 2023-01-15 DIAGNOSIS — Z955 Presence of coronary angioplasty implant and graft: Secondary | ICD-10-CM | POA: Diagnosis not present

## 2023-01-15 DIAGNOSIS — E782 Mixed hyperlipidemia: Secondary | ICD-10-CM | POA: Diagnosis not present

## 2023-01-15 DIAGNOSIS — I700 Atherosclerosis of aorta: Secondary | ICD-10-CM | POA: Diagnosis not present

## 2023-01-15 DIAGNOSIS — Z7901 Long term (current) use of anticoagulants: Secondary | ICD-10-CM

## 2023-01-15 NOTE — Patient Instructions (Signed)
Medication Instructions:  Your physician recommends that you continue on your current medications as directed. Please refer to the Current Medication list given to you today.  *If you need a refill on your cardiac medications before your next appointment, please call your pharmacy*   Lab Work: Please complete a CMP, H&H, LDL, Lipid panel and a Pro-BNP in our lab before you leave.  If you have labs (blood work) drawn today and your tests are completely normal, you will receive your results only by: MyChart Message (if you have MyChart) OR A paper copy in the mail If you have any lab test that is abnormal or we need to change your treatment, we will call you to review the results.   Testing/Procedures: None at this time.   Follow-Up: At [facility], you and your health needs are our priority.  As part of our continuing mission to provide you with exceptional heart care, we have created designated Provider Care Teams.  These Care Teams include your primary Cardiologist (physician) and Advanced Practice Providers (APPs -  Physician Assistants and Nurse Practitioners) who all work together to provide you with the care you need, when you need it.  We recommend signing up for the patient portal called "MyChart".  Sign up information is provided on this After Visit Summary.  MyChart is used to connect with patients for Virtual Visits (Telemedicine).  Patients are able to view lab/test results, encounter notes, upcoming appointments, etc.  Non-urgent messages can be sent to your provider as well.   To learn more about what you can do with MyChart, go to [email or web address].    Your next appointment:   6 month(s)  Provider:   Tessa Lerner, DO     Other Instructions Please call our office if you develop new or worsening symptoms before your next appointment so we can arrange for you to be seen.

## 2023-01-15 NOTE — Telephone Encounter (Signed)
Pt c/o medication issue:  1. Name of Medication:  allopurinol (ZYLOPRIM) 100 MG tablet   potassium chloride (KLOR-CON) 10 MEQ tablet  2. How are you currently taking this medication (dosage and times per day)?   3. Are you having a reaction (difficulty breathing--STAT)?   4. What is your medication issue?   [name] is calling to report patient takes Allopurinol 100 MG and Potassium 10 MEQ tablets. She states this was discussed during his appointment and she was advised to call the office with this information.

## 2023-01-16 LAB — PRO B NATRIURETIC PEPTIDE: NT-Pro BNP: 288 pg/mL (ref 0–486)

## 2023-01-16 LAB — COMPREHENSIVE METABOLIC PANEL
ALT: 15 [IU]/L (ref 0–44)
AST: 20 [IU]/L (ref 0–40)
Albumin: 3.7 g/dL (ref 3.7–4.7)
Alkaline Phosphatase: 81 [IU]/L (ref 44–121)
BUN/Creatinine Ratio: 14 (ref 10–24)
BUN: 14 mg/dL (ref 8–27)
Bilirubin Total: 0.5 mg/dL (ref 0.0–1.2)
CO2: 29 mmol/L (ref 20–29)
Calcium: 8.8 mg/dL (ref 8.6–10.2)
Chloride: 103 mmol/L (ref 96–106)
Creatinine, Ser: 0.99 mg/dL (ref 0.76–1.27)
Globulin, Total: 1.8 g/dL (ref 1.5–4.5)
Glucose: 91 mg/dL (ref 70–99)
Potassium: 4.4 mmol/L (ref 3.5–5.2)
Sodium: 144 mmol/L (ref 134–144)
Total Protein: 5.5 g/dL — ABNORMAL LOW (ref 6.0–8.5)
eGFR: 76 mL/min/{1.73_m2} (ref >59)

## 2023-01-16 LAB — HEMOGLOBIN AND HEMATOCRIT, BLOOD
Hematocrit: 47.5 % (ref 37.5–51.0)
Hemoglobin: 15.2 g/dL (ref 13.0–17.7)

## 2023-01-16 NOTE — Telephone Encounter (Signed)
[name] is calling to report patient takes Allopurinol 100 MG and Potassium 10 MEQ tablets. She states this was discussed during his appointment and she was advised to call the office with this information. Medication list updated.

## 2023-01-23 ENCOUNTER — Telehealth: Payer: Self-pay

## 2023-01-23 NOTE — Telephone Encounter (Signed)
Called patient to give lab results. And he asked for me to call his care taker (ex wife [name])  Spoke with [name] per DPR. She is aware of lab results and provider recommendations. She verbalized understanding. No questions at this time

## 2023-01-23 NOTE — Telephone Encounter (Signed)
-----   Message from [facility] sent at 01/18/2023  4:13 PM EDT ----- Renal function is stable. Hemoglobin at baseline-continue anticoagulation. Fluid markers within normal limits, not suggestive of heart failure exacerbation I had ordered fasting lipid profile at the same time not sure why they are not reported.  Sunit [place], DO, [facility]

## 2023-01-24 DIAGNOSIS — R609 Edema, unspecified: Secondary | ICD-10-CM | POA: Diagnosis not present

## 2023-01-24 DIAGNOSIS — Z6833 Body mass index (BMI) 33.0-33.9, adult: Secondary | ICD-10-CM | POA: Diagnosis not present

## 2023-01-24 DIAGNOSIS — R051 Acute cough: Secondary | ICD-10-CM | POA: Diagnosis not present

## 2023-02-01 DIAGNOSIS — J441 Chronic obstructive pulmonary disease with (acute) exacerbation: Secondary | ICD-10-CM | POA: Diagnosis not present

## 2023-02-01 DIAGNOSIS — Z9989 Dependence on other enabling machines and devices: Secondary | ICD-10-CM | POA: Diagnosis not present

## 2023-02-01 DIAGNOSIS — Z6832 Body mass index (BMI) 32.0-32.9, adult: Secondary | ICD-10-CM | POA: Diagnosis not present

## 2023-02-01 DIAGNOSIS — J449 Chronic obstructive pulmonary disease, unspecified: Secondary | ICD-10-CM | POA: Diagnosis not present

## 2023-02-14 DIAGNOSIS — J449 Chronic obstructive pulmonary disease, unspecified: Secondary | ICD-10-CM | POA: Diagnosis not present

## 2023-03-16 DIAGNOSIS — J449 Chronic obstructive pulmonary disease, unspecified: Secondary | ICD-10-CM | POA: Diagnosis not present

## 2023-03-18 ENCOUNTER — Telehealth: Payer: Self-pay | Admitting: Cardiology

## 2023-03-18 DIAGNOSIS — I484 Atypical atrial flutter: Secondary | ICD-10-CM

## 2023-03-18 MED ORDER — APIXABAN 5 MG PO TABS: 5.0000 mg | ORAL_TABLET | Freq: Two times a day (BID) | ORAL | 1 refills | Status: DC

## 2023-03-18 NOTE — Telephone Encounter (Signed)
Prescription refill request for Eliquis received. Indication: Aflutter Last office visit: 01/15/23 Odis Hollingshead)  Scr: 0.99 (01/15/23)  Age: 82 Weight: 102.5kg  Appropriate dose. Refill sent.

## 2023-03-18 NOTE — Telephone Encounter (Signed)
*  STAT* If patient is at the pharmacy, call can be transferred to refill team.   1. Which medications need to be refilled? (please list name of each medication and dose if known) apixaban (ELIQUIS) 5 MG TABS tablet   2. Which pharmacy/location (including street and city if local pharmacy) is medication to be sent to?  [id number] - [place], [place] - [address]    3. Do they need a 30 day or 90 day supply? 90   Patient is out of medication

## 2023-03-25 DIAGNOSIS — Z0000 Encounter for general adult medical examination without abnormal findings: Secondary | ICD-10-CM | POA: Diagnosis not present

## 2023-03-25 DIAGNOSIS — D485 Neoplasm of uncertain behavior of skin: Secondary | ICD-10-CM | POA: Diagnosis not present

## 2023-03-25 DIAGNOSIS — I484 Atypical atrial flutter: Secondary | ICD-10-CM | POA: Diagnosis not present

## 2023-03-25 DIAGNOSIS — Z6833 Body mass index (BMI) 33.0-33.9, adult: Secondary | ICD-10-CM | POA: Diagnosis not present

## 2023-03-25 DIAGNOSIS — J449 Chronic obstructive pulmonary disease, unspecified: Secondary | ICD-10-CM | POA: Diagnosis not present

## 2023-03-25 DIAGNOSIS — I5032 Chronic diastolic (congestive) heart failure: Secondary | ICD-10-CM | POA: Diagnosis not present

## 2023-03-25 DIAGNOSIS — I10 Essential (primary) hypertension: Secondary | ICD-10-CM | POA: Diagnosis not present

## 2023-03-25 DIAGNOSIS — N400 Enlarged prostate without lower urinary tract symptoms: Secondary | ICD-10-CM | POA: Diagnosis not present

## 2023-03-25 DIAGNOSIS — E7800 Pure hypercholesterolemia, unspecified: Secondary | ICD-10-CM | POA: Diagnosis not present

## 2023-04-16 DIAGNOSIS — J449 Chronic obstructive pulmonary disease, unspecified: Secondary | ICD-10-CM | POA: Diagnosis not present

## 2023-04-30 DIAGNOSIS — M79672 Pain in left foot: Secondary | ICD-10-CM | POA: Diagnosis not present

## 2023-05-06 DIAGNOSIS — N400 Enlarged prostate without lower urinary tract symptoms: Secondary | ICD-10-CM | POA: Diagnosis not present

## 2023-05-06 DIAGNOSIS — M722 Plantar fascial fibromatosis: Secondary | ICD-10-CM | POA: Diagnosis not present

## 2023-05-06 DIAGNOSIS — N1831 Chronic kidney disease, stage 3a: Secondary | ICD-10-CM | POA: Diagnosis not present

## 2023-05-06 DIAGNOSIS — I130 Hypertensive heart and chronic kidney disease with heart failure and stage 1 through stage 4 chronic kidney disease, or unspecified chronic kidney disease: Secondary | ICD-10-CM | POA: Diagnosis not present

## 2023-05-06 DIAGNOSIS — I484 Atypical atrial flutter: Secondary | ICD-10-CM | POA: Diagnosis not present

## 2023-05-06 DIAGNOSIS — D6869 Other thrombophilia: Secondary | ICD-10-CM | POA: Diagnosis not present

## 2023-05-06 DIAGNOSIS — I5032 Chronic diastolic (congestive) heart failure: Secondary | ICD-10-CM | POA: Diagnosis not present

## 2023-05-06 DIAGNOSIS — J449 Chronic obstructive pulmonary disease, unspecified: Secondary | ICD-10-CM | POA: Diagnosis not present

## 2023-05-06 DIAGNOSIS — M7662 Achilles tendinitis, left leg: Secondary | ICD-10-CM | POA: Diagnosis not present

## 2023-05-16 DIAGNOSIS — I484 Atypical atrial flutter: Secondary | ICD-10-CM | POA: Diagnosis not present

## 2023-05-16 DIAGNOSIS — J449 Chronic obstructive pulmonary disease, unspecified: Secondary | ICD-10-CM | POA: Diagnosis not present

## 2023-05-16 DIAGNOSIS — D6869 Other thrombophilia: Secondary | ICD-10-CM | POA: Diagnosis not present

## 2023-05-16 DIAGNOSIS — M25561 Pain in right knee: Secondary | ICD-10-CM | POA: Diagnosis not present

## 2023-05-16 DIAGNOSIS — G629 Polyneuropathy, unspecified: Secondary | ICD-10-CM | POA: Diagnosis not present

## 2023-05-17 DIAGNOSIS — I5032 Chronic diastolic (congestive) heart failure: Secondary | ICD-10-CM | POA: Diagnosis not present

## 2023-05-17 DIAGNOSIS — N400 Enlarged prostate without lower urinary tract symptoms: Secondary | ICD-10-CM | POA: Diagnosis not present

## 2023-05-17 DIAGNOSIS — M7662 Achilles tendinitis, left leg: Secondary | ICD-10-CM | POA: Diagnosis not present

## 2023-05-17 DIAGNOSIS — I484 Atypical atrial flutter: Secondary | ICD-10-CM | POA: Diagnosis not present

## 2023-05-17 DIAGNOSIS — I130 Hypertensive heart and chronic kidney disease with heart failure and stage 1 through stage 4 chronic kidney disease, or unspecified chronic kidney disease: Secondary | ICD-10-CM | POA: Diagnosis not present

## 2023-05-17 DIAGNOSIS — D6869 Other thrombophilia: Secondary | ICD-10-CM | POA: Diagnosis not present

## 2023-05-17 DIAGNOSIS — J449 Chronic obstructive pulmonary disease, unspecified: Secondary | ICD-10-CM | POA: Diagnosis not present

## 2023-05-17 DIAGNOSIS — M722 Plantar fascial fibromatosis: Secondary | ICD-10-CM | POA: Diagnosis not present

## 2023-05-17 DIAGNOSIS — N1831 Chronic kidney disease, stage 3a: Secondary | ICD-10-CM | POA: Diagnosis not present

## 2023-05-20 DIAGNOSIS — M722 Plantar fascial fibromatosis: Secondary | ICD-10-CM | POA: Diagnosis not present

## 2023-05-20 DIAGNOSIS — I130 Hypertensive heart and chronic kidney disease with heart failure and stage 1 through stage 4 chronic kidney disease, or unspecified chronic kidney disease: Secondary | ICD-10-CM | POA: Diagnosis not present

## 2023-05-20 DIAGNOSIS — N400 Enlarged prostate without lower urinary tract symptoms: Secondary | ICD-10-CM | POA: Diagnosis not present

## 2023-05-20 DIAGNOSIS — N1831 Chronic kidney disease, stage 3a: Secondary | ICD-10-CM | POA: Diagnosis not present

## 2023-05-20 DIAGNOSIS — I5032 Chronic diastolic (congestive) heart failure: Secondary | ICD-10-CM | POA: Diagnosis not present

## 2023-05-20 DIAGNOSIS — I484 Atypical atrial flutter: Secondary | ICD-10-CM | POA: Diagnosis not present

## 2023-05-20 DIAGNOSIS — J449 Chronic obstructive pulmonary disease, unspecified: Secondary | ICD-10-CM | POA: Diagnosis not present

## 2023-05-20 DIAGNOSIS — M7662 Achilles tendinitis, left leg: Secondary | ICD-10-CM | POA: Diagnosis not present

## 2023-05-20 DIAGNOSIS — D6869 Other thrombophilia: Secondary | ICD-10-CM | POA: Diagnosis not present

## 2023-05-21 DIAGNOSIS — C44212 Basal cell carcinoma of skin of right ear and external auricular canal: Secondary | ICD-10-CM | POA: Diagnosis not present

## 2023-05-21 DIAGNOSIS — X32XXXA Exposure to sunlight, initial encounter: Secondary | ICD-10-CM | POA: Diagnosis not present

## 2023-05-21 DIAGNOSIS — C44319 Basal cell carcinoma of skin of other parts of face: Secondary | ICD-10-CM | POA: Diagnosis not present

## 2023-05-21 DIAGNOSIS — L570 Actinic keratosis: Secondary | ICD-10-CM | POA: Diagnosis not present

## 2023-05-21 DIAGNOSIS — L821 Other seborrheic keratosis: Secondary | ICD-10-CM | POA: Diagnosis not present

## 2023-05-23 DIAGNOSIS — D6869 Other thrombophilia: Secondary | ICD-10-CM | POA: Diagnosis not present

## 2023-05-23 DIAGNOSIS — I130 Hypertensive heart and chronic kidney disease with heart failure and stage 1 through stage 4 chronic kidney disease, or unspecified chronic kidney disease: Secondary | ICD-10-CM | POA: Diagnosis not present

## 2023-05-23 DIAGNOSIS — N400 Enlarged prostate without lower urinary tract symptoms: Secondary | ICD-10-CM | POA: Diagnosis not present

## 2023-05-23 DIAGNOSIS — N1831 Chronic kidney disease, stage 3a: Secondary | ICD-10-CM | POA: Diagnosis not present

## 2023-05-23 DIAGNOSIS — J449 Chronic obstructive pulmonary disease, unspecified: Secondary | ICD-10-CM | POA: Diagnosis not present

## 2023-05-23 DIAGNOSIS — M7662 Achilles tendinitis, left leg: Secondary | ICD-10-CM | POA: Diagnosis not present

## 2023-05-23 DIAGNOSIS — M722 Plantar fascial fibromatosis: Secondary | ICD-10-CM | POA: Diagnosis not present

## 2023-05-23 DIAGNOSIS — I5032 Chronic diastolic (congestive) heart failure: Secondary | ICD-10-CM | POA: Diagnosis not present

## 2023-05-23 DIAGNOSIS — I484 Atypical atrial flutter: Secondary | ICD-10-CM | POA: Diagnosis not present

## 2023-05-27 DIAGNOSIS — I484 Atypical atrial flutter: Secondary | ICD-10-CM | POA: Diagnosis not present

## 2023-05-27 DIAGNOSIS — M7662 Achilles tendinitis, left leg: Secondary | ICD-10-CM | POA: Diagnosis not present

## 2023-05-27 DIAGNOSIS — N400 Enlarged prostate without lower urinary tract symptoms: Secondary | ICD-10-CM | POA: Diagnosis not present

## 2023-05-27 DIAGNOSIS — I5032 Chronic diastolic (congestive) heart failure: Secondary | ICD-10-CM | POA: Diagnosis not present

## 2023-05-27 DIAGNOSIS — J449 Chronic obstructive pulmonary disease, unspecified: Secondary | ICD-10-CM | POA: Diagnosis not present

## 2023-05-27 DIAGNOSIS — N1831 Chronic kidney disease, stage 3a: Secondary | ICD-10-CM | POA: Diagnosis not present

## 2023-05-27 DIAGNOSIS — I130 Hypertensive heart and chronic kidney disease with heart failure and stage 1 through stage 4 chronic kidney disease, or unspecified chronic kidney disease: Secondary | ICD-10-CM | POA: Diagnosis not present

## 2023-05-27 DIAGNOSIS — M722 Plantar fascial fibromatosis: Secondary | ICD-10-CM | POA: Diagnosis not present

## 2023-05-27 DIAGNOSIS — D6869 Other thrombophilia: Secondary | ICD-10-CM | POA: Diagnosis not present

## 2023-05-29 DIAGNOSIS — G8929 Other chronic pain: Secondary | ICD-10-CM | POA: Diagnosis not present

## 2023-05-29 DIAGNOSIS — I872 Venous insufficiency (chronic) (peripheral): Secondary | ICD-10-CM | POA: Diagnosis not present

## 2023-05-29 DIAGNOSIS — M1711 Unilateral primary osteoarthritis, right knee: Secondary | ICD-10-CM | POA: Diagnosis not present

## 2023-05-30 DIAGNOSIS — I484 Atypical atrial flutter: Secondary | ICD-10-CM | POA: Diagnosis not present

## 2023-05-30 DIAGNOSIS — I5032 Chronic diastolic (congestive) heart failure: Secondary | ICD-10-CM | POA: Diagnosis not present

## 2023-05-30 DIAGNOSIS — D6869 Other thrombophilia: Secondary | ICD-10-CM | POA: Diagnosis not present

## 2023-05-30 DIAGNOSIS — I130 Hypertensive heart and chronic kidney disease with heart failure and stage 1 through stage 4 chronic kidney disease, or unspecified chronic kidney disease: Secondary | ICD-10-CM | POA: Diagnosis not present

## 2023-05-30 DIAGNOSIS — J449 Chronic obstructive pulmonary disease, unspecified: Secondary | ICD-10-CM | POA: Diagnosis not present

## 2023-05-30 DIAGNOSIS — M722 Plantar fascial fibromatosis: Secondary | ICD-10-CM | POA: Diagnosis not present

## 2023-05-30 DIAGNOSIS — M7662 Achilles tendinitis, left leg: Secondary | ICD-10-CM | POA: Diagnosis not present

## 2023-05-30 DIAGNOSIS — N1831 Chronic kidney disease, stage 3a: Secondary | ICD-10-CM | POA: Diagnosis not present

## 2023-05-30 DIAGNOSIS — N400 Enlarged prostate without lower urinary tract symptoms: Secondary | ICD-10-CM | POA: Diagnosis not present

## 2023-06-06 DIAGNOSIS — N1831 Chronic kidney disease, stage 3a: Secondary | ICD-10-CM | POA: Diagnosis not present

## 2023-06-06 DIAGNOSIS — M722 Plantar fascial fibromatosis: Secondary | ICD-10-CM | POA: Diagnosis not present

## 2023-06-06 DIAGNOSIS — N400 Enlarged prostate without lower urinary tract symptoms: Secondary | ICD-10-CM | POA: Diagnosis not present

## 2023-06-06 DIAGNOSIS — I5032 Chronic diastolic (congestive) heart failure: Secondary | ICD-10-CM | POA: Diagnosis not present

## 2023-06-06 DIAGNOSIS — D6869 Other thrombophilia: Secondary | ICD-10-CM | POA: Diagnosis not present

## 2023-06-06 DIAGNOSIS — M7662 Achilles tendinitis, left leg: Secondary | ICD-10-CM | POA: Diagnosis not present

## 2023-06-06 DIAGNOSIS — J449 Chronic obstructive pulmonary disease, unspecified: Secondary | ICD-10-CM | POA: Diagnosis not present

## 2023-06-06 DIAGNOSIS — I130 Hypertensive heart and chronic kidney disease with heart failure and stage 1 through stage 4 chronic kidney disease, or unspecified chronic kidney disease: Secondary | ICD-10-CM | POA: Diagnosis not present

## 2023-06-06 DIAGNOSIS — I484 Atypical atrial flutter: Secondary | ICD-10-CM | POA: Diagnosis not present

## 2023-06-12 ENCOUNTER — Other Ambulatory Visit: Payer: Self-pay

## 2023-06-12 ENCOUNTER — Emergency Department (HOSPITAL_BASED_OUTPATIENT_CLINIC_OR_DEPARTMENT_OTHER): Payer: Medicare PPO | Admitting: Radiology

## 2023-06-12 ENCOUNTER — Encounter (HOSPITAL_BASED_OUTPATIENT_CLINIC_OR_DEPARTMENT_OTHER): Payer: Self-pay | Admitting: Emergency Medicine

## 2023-06-12 ENCOUNTER — Emergency Department (HOSPITAL_BASED_OUTPATIENT_CLINIC_OR_DEPARTMENT_OTHER): Payer: Medicare PPO

## 2023-06-12 ENCOUNTER — Emergency Department (HOSPITAL_BASED_OUTPATIENT_CLINIC_OR_DEPARTMENT_OTHER)
Admission: EM | Admit: 2023-06-12 | Discharge: 2023-06-12 | Disposition: A | Discharge status: Home / Self Care | Payer: Medicare PPO | Attending: Emergency Medicine | Admitting: Emergency Medicine

## 2023-06-12 DIAGNOSIS — Z7901 Long term (current) use of anticoagulants: Secondary | ICD-10-CM | POA: Insufficient documentation

## 2023-06-12 DIAGNOSIS — Z7982 Long term (current) use of aspirin: Secondary | ICD-10-CM | POA: Insufficient documentation

## 2023-06-12 DIAGNOSIS — I2510 Atherosclerotic heart disease of native coronary artery without angina pectoris: Secondary | ICD-10-CM | POA: Insufficient documentation

## 2023-06-12 DIAGNOSIS — D72829 Elevated white blood cell count, unspecified: Secondary | ICD-10-CM | POA: Insufficient documentation

## 2023-06-12 DIAGNOSIS — M79605 Pain in left leg: Secondary | ICD-10-CM | POA: Diagnosis not present

## 2023-06-12 DIAGNOSIS — M25562 Pain in left knee: Secondary | ICD-10-CM | POA: Diagnosis not present

## 2023-06-12 DIAGNOSIS — N189 Chronic kidney disease, unspecified: Secondary | ICD-10-CM | POA: Insufficient documentation

## 2023-06-12 DIAGNOSIS — L03116 Cellulitis of left lower limb: Secondary | ICD-10-CM | POA: Diagnosis not present

## 2023-06-12 DIAGNOSIS — J449 Chronic obstructive pulmonary disease, unspecified: Secondary | ICD-10-CM | POA: Diagnosis not present

## 2023-06-12 DIAGNOSIS — M25462 Effusion, left knee: Secondary | ICD-10-CM | POA: Diagnosis not present

## 2023-06-12 LAB — CBC WITH DIFFERENTIAL/PLATELET
Abs Immature Granulocytes: 0.06 10*3/uL (ref 0.00–0.07)
Basophils Absolute: 0.1 10*3/uL (ref 0.0–0.1)
Basophils Relative: 1 %
Eosinophils Absolute: 0.4 10*3/uL (ref 0.0–0.5)
Eosinophils Relative: 3 %
HCT: 50.2 % (ref 39.0–52.0)
Hemoglobin: 16.1 g/dL (ref 13.0–17.0)
Immature Granulocytes: 1 %
Lymphocytes Relative: 12 %
Lymphs Abs: 1.3 10*3/uL (ref 0.7–4.0)
MCH: 28.9 pg (ref 26.0–34.0)
MCHC: 32.1 g/dL (ref 30.0–36.0)
MCV: 90.1 fL (ref 80.0–100.0)
Monocytes Absolute: 1.2 10*3/uL — ABNORMAL HIGH (ref 0.1–1.0)
Monocytes Relative: 11 %
Neutro Abs: 8.3 10*3/uL — ABNORMAL HIGH (ref 1.7–7.7)
Neutrophils Relative %: 72 %
Platelets: 148 10*3/uL — ABNORMAL LOW (ref 150–400)
RBC: 5.57 MIL/uL (ref 4.22–5.81)
RDW: 15 % (ref 11.5–15.5)
WBC: 11.3 10*3/uL — ABNORMAL HIGH (ref 4.0–10.5)
nRBC: 0 % (ref 0.0–0.2)

## 2023-06-12 LAB — BASIC METABOLIC PANEL
Anion gap: 6 (ref 5–15)
BUN: 21 mg/dL (ref 8–23)
CO2: 30 mmol/L (ref 22–32)
Calcium: 8.7 mg/dL — ABNORMAL LOW (ref 8.9–10.3)
Chloride: 104 mmol/L (ref 98–111)
Creatinine, Ser: 1.24 mg/dL (ref 0.61–1.24)
GFR, Estimated: 58 mL/min — ABNORMAL LOW (ref >60)
Glucose, Bld: 77 mg/dL (ref 70–99)
Potassium: 4.5 mmol/L (ref 3.5–5.1)
Sodium: 140 mmol/L (ref 135–145)

## 2023-06-12 MED ORDER — OXYCODONE HCL 5 MG PO TABS: 2.5000 mg | ORAL_TABLET | Freq: Four times a day (QID) | ORAL | 0 refills | Status: DC | PRN

## 2023-06-12 MED ORDER — FENTANYL CITRATE PF 50 MCG/ML IJ SOSY
50.0000 ug | PREFILLED_SYRINGE | Freq: Once | INTRAMUSCULAR | Status: AC
  Administered 2023-06-12: 50 ug via INTRAVENOUS
  Filled 2023-06-12: qty 1

## 2023-06-12 MED ORDER — DOXYCYCLINE HYCLATE 100 MG PO TABS
100.0000 mg | ORAL_TABLET | Freq: Once | ORAL | Status: AC
  Administered 2023-06-12: 100 mg via ORAL
  Filled 2023-06-12: qty 1

## 2023-06-12 MED ORDER — OXYCODONE HCL 5 MG PO TABS
5.0000 mg | ORAL_TABLET | Freq: Once | ORAL | Status: AC
  Administered 2023-06-12: 5 mg via ORAL
  Filled 2023-06-12: qty 1

## 2023-06-12 MED ORDER — DOXYCYCLINE HYCLATE 100 MG PO CAPS: 100.0000 mg | ORAL_CAPSULE | Freq: Two times a day (BID) | ORAL | 0 refills | Status: DC

## 2023-06-12 NOTE — Discharge Instructions (Signed)
 You are treating you for a skin infection called cellulitis.  Take the antibiotics until completed unless you develop side effects or allergic reaction.  We are also treating you with oxycodone for severe pain, tried Tylenol before this.  Elevate your legs whenever you are at rest.  If you develop new or worsening pain, worsening redness, fever, chills, or any other new/concerning symptoms then return to the ER or call 911.  Otherwise follow-up with your primary care doctor to ensure improvement.

## 2023-06-12 NOTE — ED Notes (Signed)
 RT assessed the Pt because he forgot his oxygen. His SATS were 95% on RA. RT placed the Pt on 2l [place] because this is what he wears at home. Pt lungs sounded clear. The Pt's main complaint is knee pain.

## 2023-06-12 NOTE — ED Triage Notes (Signed)
 L knee pain x 5 days. No known injury. States it feels different than previous gout flare.

## 2023-06-12 NOTE — ED Notes (Signed)
 Pt ambulated around room with front wheel walker, pt able to ambulate independently with stand-by assistance. Pt provided snack and drink after ambulating.

## 2023-06-12 NOTE — ED Notes (Signed)
 PT ambulated with steady gait and no assistance around thew room multiple times. Pt utilized walker but has one at home and uses per normal. RN at bedside to confirm

## 2023-06-12 NOTE — ED Provider Notes (Signed)
 [place] EMERGENCY DEPARTMENT AT [facility] Provider Note   CSN: [id number] Arrival date & time: 06/12/23  0840     History  Chief Complaint  Patient presents with   Knee Pain    Mitchell [name] is a 83 y.o. male.  HPI 83 year old male presents with left knee pain.  He has a chronically bad right knee but over the last 5-7 days he has had atraumatic left knee pain.  It hurts from his knee down.  Leg seems a little swollen as well.  No fevers.  He has chronic dyspnea and chronic chest symptoms but these are not new or worse over the same time period.  No new numbness but does have chronic numbness in the leg.  He has taken Tylenol without much relief.  Has multiple comorbidities including CAD, CKD, COPD, is on home oxygen, and other comorbidities.  Home Medications Prior to Admission medications   Medication Sig Start Date End Date Taking? Authorizing Provider  doxycycline (VIBRAMYCIN) 100 MG capsule Take 1 capsule (100 mg total) by mouth 2 (two) times daily. One po bid x 7 days 06/12/23  Yes Pricilla Loveless, MD  oxyCODONE (ROXICODONE) 5 MG immediate release tablet Take 0.5-1 tablets (2.5-5 mg total) by mouth every 6 (six) hours as needed for severe pain (pain score 7-10). 06/12/23  Yes Pricilla Loveless, MD  QUEtiapine (SEROQUEL) 50 MG tablet Take 50 mg by mouth at bedtime. 03/12/23  Yes [provider]  albuterol (PROVENTIL HFA;VENTOLIN HFA) 108 (90 BASE) MCG/ACT inhaler Inhale 2 puffs into the lungs every 6 (six) hours as needed for wheezing. Reported on 05/04/2015    [provider]  allopurinol (ZYLOPRIM) 100 MG tablet Take 100 mg by mouth in the morning. 11/07/20   [provider]  ALPRAZolam [name]) 0.25 MG tablet Take 0.25 mg by mouth daily as needed for anxiety.    [provider]  apixaban (ELIQUIS) 5 MG TABS tablet Take 1 tablet (5 mg total) by mouth 2 (two) times daily. 03/18/23   Tolia, Sunit, DO  aspirin EC 81 MG tablet Take 81 mg  by mouth daily. Swallow whole.    [provider]  azithromycin (ZITHROMAX) 250 MG tablet Take as directed 12/28/22   Martina Sinner, MD  Budeson-Glycopyrrol-Formoterol (BREZTRI AEROSPHERE) 160-9-4.8 MCG/ACT AERO Inhale 2 puffs into the lungs in the morning and at bedtime. 02/21/22   Martina Sinner, MD  Budeson-Glycopyrrol-Formoterol (BREZTRI AEROSPHERE) 160-9-4.8 MCG/ACT AERO Inhale 2 puffs into the lungs in the morning and at bedtime. 09/26/22   Martina Sinner, MD  Budeson-Glycopyrrol-Formoterol (BREZTRI AEROSPHERE) 160-9-4.8 MCG/ACT AERO Inhale 2 puffs into the lungs in the morning and at bedtime. 11/12/22   Martina Sinner, MD  finasteride (PROSCAR) 5 MG tablet Take 5 mg by mouth in the morning. Reported on 05/04/2015    [provider]  furosemide (LASIX) 20 MG tablet Take by mouth daily.    [provider]  metoprolol tartrate (LOPRESSOR) 25 MG tablet TAKE 1 TABLET TWICE DAILY Patient taking differently: Take 25 mg by mouth 2 (two) times daily. 03/22/20   Lyn Records, MD  Phenylephrine-Acetaminophen (TYLENOL SINUS+HEADACHE PO) Take 1 tablet by mouth 2 (two) times daily as needed (sinus pressure/pain).    [provider]  potassium chloride (KLOR-CON) 10 MEQ tablet Take 10 mEq by mouth in the morning.    [provider]  pravastatin (PRAVACHOL) 20 MG tablet Take 1 tablet (20 mg total) by mouth every evening. 11/17/21  Lyn Records, MD      Allergies    Lipitor [atorvastatin], Crestor [rosuvastatin], and Norco [hydrocodone-acetaminophen]    Review of Systems   Review of Systems  Constitutional:  Negative for fever.  Respiratory:  Negative for shortness of breath.   Cardiovascular:  Positive for leg swelling. Negative for chest pain.  Musculoskeletal:  Positive for arthralgias.    Physical Exam Updated Vital Signs BP 131/70   Pulse 72   Temp 98.2 F (36.8 C) (Oral)   Resp 18   SpO2 95%  Physical Exam Vitals and nursing  note reviewed.  Constitutional:      Appearance: He is well-developed. He is obese. He is not diaphoretic.  HENT:     Head: Normocephalic and atraumatic.  Cardiovascular:     Rate and Rhythm: Normal rate and regular rhythm.     Pulses:          Dorsalis pedis pulses are 2+ on the left side.  Pulmonary:     Effort: Pulmonary effort is normal.  Musculoskeletal:     Left upper leg: No swelling or tenderness.     Left knee: No swelling. Decreased range of motion. Tenderness present.     Left lower leg: Swelling and tenderness present.     Comments: Left calf is mildly diffusely swollen compared to right. There is also some light erythema to the anterior lower leg. No erythema or obvious swelling/effusion to the knee.  Skin:    General: Skin is warm and dry.  Neurological:     Mental Status: He is alert.     ED Results / Procedures / Treatments   Labs (all labs ordered are listed, but only abnormal results are displayed) Labs Reviewed  BASIC METABOLIC PANEL - Abnormal; Notable for the following components:      Result Value   Calcium 8.7 (*)    GFR, Estimated 58 (*)    All other components within normal limits  CBC WITH DIFFERENTIAL/PLATELET - Abnormal; Notable for the following components:   WBC 11.3 (*)    Platelets 148 (*)    Neutro Abs 8.3 (*)    Monocytes Absolute 1.2 (*)    All other components within normal limits    EKG None  Radiology DG Knee Complete 4 Views Left Result Date: 06/12/2023 CLINICAL DATA:  Left knee pain for 5 days EXAM: LEFT KNEE - COMPLETE 4 VIEW COMPARISON:  None Available. FINDINGS: No fracture or dislocation. Preserved joint spaces. Chondrocalcinosis of the mediolateral compartments. Scattered vascular calcifications. Small joint effusion. IMPRESSION: Chondrocalcinosis. Small joint effusion. This has a differential. Please correlate with clinical presentation. Electronically Signed   By: Karen Kays M.D.   On: 06/12/2023 12:05   US Venous Img  Lower Unilateral Left Result Date: 06/12/2023 CLINICAL DATA:  Left knee and leg pain for 5 days EXAM: Left LOWER EXTREMITY VENOUS DOPPLER ULTRASOUND TECHNIQUE: Gray-scale sonography with compression, as well as color and duplex ultrasound, were performed to evaluate the deep venous system(s) from the level of the common femoral vein through the popliteal and proximal calf veins. COMPARISON:  None Available. FINDINGS: VENOUS Normal compressibility of the common femoral, superficial femoral, and popliteal veins, as well as the visualized calf veins. Visualized portions of profunda femoral vein and great saphenous vein unremarkable. No filling defects to suggest DVT on grayscale or color Doppler imaging. Doppler waveforms show normal direction of venous flow, normal respiratory plasticity and response to augmentation. Limited views of the contralateral common femoral vein are  unremarkable. OTHER None. Limitations: none IMPRESSION: No evidence of left lower extremity DVT. Electronically Signed   By: Karen Kays M.D.   On: 06/12/2023 12:04    Procedures Procedures    Medications Ordered in ED Medications  fentaNYL (SUBLIMAZE) injection 50 mcg (50 mcg Intravenous Given 06/12/23 1035)  oxyCODONE (Oxy IR/ROXICODONE) immediate release tablet 5 mg (5 mg Oral Given 06/12/23 1257)  doxycycline (VIBRA-TABS) tablet 100 mg (100 mg Oral Given 06/12/23 1257)    ED Course/ Medical Decision Making/ A&P                                 Medical Decision Making Amount and/or Complexity of Data Reviewed Labs: ordered.    Details: Mild leukocytosis Radiology: ordered and independent interpretation performed.    Details: No DVT or fracture  Risk Prescription drug management.   Overall, I suspect patient is having cellulitis causing his leg pain.  He is afebrile and otherwise chronically ill-appearing but not acutely ill-appearing.  Labs show a slight leukocytosis which goes along with infection.  After some pain  control he was able to get up and ambulate at his baseline and feels comfortable going home.  He has family to help him out.  I highly doubt a septic joint, there is no clinically apparent effusion and no erythema or significant warmth.  He also has a more likely explanation of erythema on his anterior lower leg that would explain why he is having pain.  Doubt deep space/necrotizing infection.  Offered admission but he prefers to go home.  Will give doxycycline, short course of pain control, and recommend close outpatient follow-up with PCP.  Given return precautions.        Final Clinical Impression(s) / ED Diagnoses Final diagnoses:  Left leg cellulitis    Rx / DC Orders ED Discharge Orders          Ordered    oxyCODONE (ROXICODONE) 5 MG immediate release tablet  Every 6 hours PRN        06/12/23 1336    doxycycline (VIBRAMYCIN) 100 MG capsule  2 times daily        06/12/23 1336              Pricilla Loveless, MD 06/12/23 1429

## 2023-06-14 DIAGNOSIS — J449 Chronic obstructive pulmonary disease, unspecified: Secondary | ICD-10-CM | POA: Diagnosis not present

## 2023-06-17 DIAGNOSIS — M25562 Pain in left knee: Secondary | ICD-10-CM | POA: Diagnosis not present

## 2023-06-17 DIAGNOSIS — Z6833 Body mass index (BMI) 33.0-33.9, adult: Secondary | ICD-10-CM | POA: Diagnosis not present

## 2023-06-17 DIAGNOSIS — L03116 Cellulitis of left lower limb: Secondary | ICD-10-CM | POA: Diagnosis not present

## 2023-06-26 DIAGNOSIS — I130 Hypertensive heart and chronic kidney disease with heart failure and stage 1 through stage 4 chronic kidney disease, or unspecified chronic kidney disease: Secondary | ICD-10-CM | POA: Diagnosis not present

## 2023-06-26 DIAGNOSIS — I484 Atypical atrial flutter: Secondary | ICD-10-CM | POA: Diagnosis not present

## 2023-06-26 DIAGNOSIS — M7662 Achilles tendinitis, left leg: Secondary | ICD-10-CM | POA: Diagnosis not present

## 2023-06-26 DIAGNOSIS — D6869 Other thrombophilia: Secondary | ICD-10-CM | POA: Diagnosis not present

## 2023-06-26 DIAGNOSIS — J449 Chronic obstructive pulmonary disease, unspecified: Secondary | ICD-10-CM | POA: Diagnosis not present

## 2023-06-26 DIAGNOSIS — N1831 Chronic kidney disease, stage 3a: Secondary | ICD-10-CM | POA: Diagnosis not present

## 2023-06-26 DIAGNOSIS — N400 Enlarged prostate without lower urinary tract symptoms: Secondary | ICD-10-CM | POA: Diagnosis not present

## 2023-06-26 DIAGNOSIS — I5032 Chronic diastolic (congestive) heart failure: Secondary | ICD-10-CM | POA: Diagnosis not present

## 2023-06-26 DIAGNOSIS — M722 Plantar fascial fibromatosis: Secondary | ICD-10-CM | POA: Diagnosis not present

## 2023-07-02 DIAGNOSIS — L570 Actinic keratosis: Secondary | ICD-10-CM | POA: Diagnosis not present

## 2023-07-02 DIAGNOSIS — X32XXXD Exposure to sunlight, subsequent encounter: Secondary | ICD-10-CM | POA: Diagnosis not present

## 2023-07-02 DIAGNOSIS — Z85828 Personal history of other malignant neoplasm of skin: Secondary | ICD-10-CM | POA: Diagnosis not present

## 2023-07-02 DIAGNOSIS — Z08 Encounter for follow-up examination after completed treatment for malignant neoplasm: Secondary | ICD-10-CM | POA: Diagnosis not present

## 2023-07-02 DIAGNOSIS — C44219 Basal cell carcinoma of skin of left ear and external auricular canal: Secondary | ICD-10-CM | POA: Diagnosis not present

## 2023-07-11 ENCOUNTER — Ambulatory Visit: Payer: Medicare PPO | Attending: Cardiology | Admitting: Cardiology

## 2023-07-11 ENCOUNTER — Telehealth: Payer: Self-pay | Admitting: Pulmonary Disease

## 2023-07-11 NOTE — Telephone Encounter (Signed)
 CMN received from [facility] on 07/11/23 for oxygen concentrator and portable oxygen concentrator .

## 2023-07-15 DIAGNOSIS — J449 Chronic obstructive pulmonary disease, unspecified: Secondary | ICD-10-CM | POA: Diagnosis not present

## 2023-07-17 DIAGNOSIS — S80212A Abrasion, left knee, initial encounter: Secondary | ICD-10-CM | POA: Diagnosis not present

## 2023-07-17 DIAGNOSIS — G629 Polyneuropathy, unspecified: Secondary | ICD-10-CM | POA: Diagnosis not present

## 2023-07-17 DIAGNOSIS — Z6834 Body mass index (BMI) 34.0-34.9, adult: Secondary | ICD-10-CM | POA: Diagnosis not present

## 2023-07-23 NOTE — Telephone Encounter (Signed)
 Cmn faxed successfully and signed.

## 2023-07-24 ENCOUNTER — Telehealth: Payer: Self-pay | Admitting: Pulmonary Disease

## 2023-07-24 NOTE — Telephone Encounter (Signed)
 Cmn received from [facility] for oxygen concentrator.

## 2023-07-29 NOTE — Telephone Encounter (Signed)
 CMN faxed successfully and signed.

## 2023-08-14 DIAGNOSIS — J449 Chronic obstructive pulmonary disease, unspecified: Secondary | ICD-10-CM | POA: Diagnosis not present

## 2023-09-05 DIAGNOSIS — H9190 Unspecified hearing loss, unspecified ear: Secondary | ICD-10-CM | POA: Diagnosis not present

## 2023-09-05 DIAGNOSIS — G629 Polyneuropathy, unspecified: Secondary | ICD-10-CM | POA: Diagnosis not present

## 2023-09-05 DIAGNOSIS — M109 Gout, unspecified: Secondary | ICD-10-CM | POA: Diagnosis not present

## 2023-09-05 DIAGNOSIS — Z6833 Body mass index (BMI) 33.0-33.9, adult: Secondary | ICD-10-CM | POA: Diagnosis not present

## 2023-09-13 ENCOUNTER — Other Ambulatory Visit: Payer: Self-pay

## 2023-09-13 ENCOUNTER — Inpatient Hospital Stay (HOSPITAL_COMMUNITY)
Admission: EM | Admit: 2023-09-13 | Discharge: 2023-09-17 | DRG: 190 | Disposition: A | Discharge status: Home Health | Attending: Internal Medicine | Admitting: Internal Medicine

## 2023-09-13 ENCOUNTER — Emergency Department (HOSPITAL_COMMUNITY)

## 2023-09-13 DIAGNOSIS — J9621 Acute and chronic respiratory failure with hypoxia: Secondary | ICD-10-CM | POA: Diagnosis present

## 2023-09-13 DIAGNOSIS — I2510 Atherosclerotic heart disease of native coronary artery without angina pectoris: Secondary | ICD-10-CM | POA: Diagnosis present

## 2023-09-13 DIAGNOSIS — F32A Depression, unspecified: Secondary | ICD-10-CM | POA: Diagnosis present

## 2023-09-13 DIAGNOSIS — H548 Legal blindness, as defined in USA: Secondary | ICD-10-CM | POA: Diagnosis present

## 2023-09-13 DIAGNOSIS — I4892 Unspecified atrial flutter: Secondary | ICD-10-CM | POA: Diagnosis present

## 2023-09-13 DIAGNOSIS — Z885 Allergy status to narcotic agent status: Secondary | ICD-10-CM

## 2023-09-13 DIAGNOSIS — Z888 Allergy status to other drugs, medicaments and biological substances status: Secondary | ICD-10-CM

## 2023-09-13 DIAGNOSIS — F419 Anxiety disorder, unspecified: Secondary | ICD-10-CM | POA: Diagnosis present

## 2023-09-13 DIAGNOSIS — R0602 Shortness of breath: Secondary | ICD-10-CM | POA: Diagnosis not present

## 2023-09-13 DIAGNOSIS — Z955 Presence of coronary angioplasty implant and graft: Secondary | ICD-10-CM

## 2023-09-13 DIAGNOSIS — Z7901 Long term (current) use of anticoagulants: Secondary | ICD-10-CM | POA: Diagnosis not present

## 2023-09-13 DIAGNOSIS — Z66 Do not resuscitate: Secondary | ICD-10-CM | POA: Diagnosis present

## 2023-09-13 DIAGNOSIS — G4700 Insomnia, unspecified: Secondary | ICD-10-CM | POA: Diagnosis present

## 2023-09-13 DIAGNOSIS — Z881 Allergy status to other antibiotic agents status: Secondary | ICD-10-CM

## 2023-09-13 DIAGNOSIS — I129 Hypertensive chronic kidney disease with stage 1 through stage 4 chronic kidney disease, or unspecified chronic kidney disease: Secondary | ICD-10-CM | POA: Diagnosis present

## 2023-09-13 DIAGNOSIS — J449 Chronic obstructive pulmonary disease, unspecified: Secondary | ICD-10-CM | POA: Diagnosis not present

## 2023-09-13 DIAGNOSIS — E785 Hyperlipidemia, unspecified: Secondary | ICD-10-CM | POA: Diagnosis present

## 2023-09-13 DIAGNOSIS — Z8249 Family history of ischemic heart disease and other diseases of the circulatory system: Secondary | ICD-10-CM | POA: Diagnosis not present

## 2023-09-13 DIAGNOSIS — Z9981 Dependence on supplemental oxygen: Secondary | ICD-10-CM

## 2023-09-13 DIAGNOSIS — Z87891 Personal history of nicotine dependence: Secondary | ICD-10-CM

## 2023-09-13 DIAGNOSIS — N179 Acute kidney failure, unspecified: Secondary | ICD-10-CM | POA: Diagnosis present

## 2023-09-13 DIAGNOSIS — N183 Chronic kidney disease, stage 3 unspecified: Secondary | ICD-10-CM | POA: Diagnosis present

## 2023-09-13 DIAGNOSIS — G629 Polyneuropathy, unspecified: Secondary | ICD-10-CM | POA: Diagnosis present

## 2023-09-13 DIAGNOSIS — J441 Chronic obstructive pulmonary disease with (acute) exacerbation: Principal | ICD-10-CM | POA: Diagnosis present

## 2023-09-13 DIAGNOSIS — K219 Gastro-esophageal reflux disease without esophagitis: Secondary | ICD-10-CM | POA: Diagnosis present

## 2023-09-13 DIAGNOSIS — E876 Hypokalemia: Secondary | ICD-10-CM | POA: Diagnosis present

## 2023-09-13 DIAGNOSIS — Z961 Presence of intraocular lens: Secondary | ICD-10-CM | POA: Diagnosis present

## 2023-09-13 DIAGNOSIS — Z79899 Other long term (current) drug therapy: Secondary | ICD-10-CM

## 2023-09-13 DIAGNOSIS — I252 Old myocardial infarction: Secondary | ICD-10-CM

## 2023-09-13 DIAGNOSIS — Z7982 Long term (current) use of aspirin: Secondary | ICD-10-CM

## 2023-09-13 DIAGNOSIS — Z1152 Encounter for screening for COVID-19: Secondary | ICD-10-CM

## 2023-09-13 DIAGNOSIS — R059 Cough, unspecified: Secondary | ICD-10-CM | POA: Diagnosis not present

## 2023-09-13 DIAGNOSIS — R269 Unspecified abnormalities of gait and mobility: Secondary | ICD-10-CM | POA: Diagnosis not present

## 2023-09-13 DIAGNOSIS — R069 Unspecified abnormalities of breathing: Secondary | ICD-10-CM | POA: Diagnosis not present

## 2023-09-13 DIAGNOSIS — N400 Enlarged prostate without lower urinary tract symptoms: Secondary | ICD-10-CM | POA: Diagnosis present

## 2023-09-13 DIAGNOSIS — Z7951 Long term (current) use of inhaled steroids: Secondary | ICD-10-CM

## 2023-09-13 LAB — TROPONIN I (HIGH SENSITIVITY): Troponin I (High Sensitivity): 3 ng/L (ref <18)

## 2023-09-13 LAB — COMPREHENSIVE METABOLIC PANEL WITH GFR
ALT: 14 U/L (ref 0–44)
AST: 16 U/L (ref 15–41)
Albumin: 2.7 g/dL — ABNORMAL LOW (ref 3.5–5.0)
Alkaline Phosphatase: 52 U/L (ref 38–126)
Anion gap: 6 (ref 5–15)
BUN: 16 mg/dL (ref 8–23)
CO2: 24 mmol/L (ref 22–32)
Calcium: 6.6 mg/dL — ABNORMAL LOW (ref 8.9–10.3)
Chloride: 109 mmol/L (ref 98–111)
Creatinine, Ser: 0.86 mg/dL (ref 0.61–1.24)
GFR, Estimated: >60 mL/min (ref >60)
Glucose, Bld: 107 mg/dL — ABNORMAL HIGH (ref 70–99)
Potassium: 3.4 mmol/L — ABNORMAL LOW (ref 3.5–5.1)
Sodium: 139 mmol/L (ref 135–145)
Total Bilirubin: 0.9 mg/dL (ref 0.0–1.2)
Total Protein: 4.9 g/dL — ABNORMAL LOW (ref 6.5–8.1)

## 2023-09-13 LAB — CBC WITH DIFFERENTIAL/PLATELET
Abs Immature Granulocytes: 0.02 10*3/uL (ref 0.00–0.07)
Basophils Absolute: 0.1 10*3/uL (ref 0.0–0.1)
Basophils Relative: 1 %
Eosinophils Absolute: 0.4 10*3/uL (ref 0.0–0.5)
Eosinophils Relative: 3 %
HCT: 48 % (ref 39.0–52.0)
Hemoglobin: 14.8 g/dL (ref 13.0–17.0)
Immature Granulocytes: 0 %
Lymphocytes Relative: 9 %
Lymphs Abs: 1 10*3/uL (ref 0.7–4.0)
MCH: 29 pg (ref 26.0–34.0)
MCHC: 30.8 g/dL (ref 30.0–36.0)
MCV: 93.9 fL (ref 80.0–100.0)
Monocytes Absolute: 1.5 10*3/uL — ABNORMAL HIGH (ref 0.1–1.0)
Monocytes Relative: 13 %
Neutro Abs: 8.7 10*3/uL — ABNORMAL HIGH (ref 1.7–7.7)
Neutrophils Relative %: 74 %
Platelets: 159 10*3/uL (ref 150–400)
RBC: 5.11 MIL/uL (ref 4.22–5.81)
RDW: 15.4 % (ref 11.5–15.5)
WBC: 11.6 10*3/uL — ABNORMAL HIGH (ref 4.0–10.5)
nRBC: 0 % (ref 0.0–0.2)

## 2023-09-13 LAB — BLOOD GAS, ARTERIAL
Acid-Base Excess: 6.1 mmol/L — ABNORMAL HIGH (ref 0.0–2.0)
Bicarbonate: 31.8 mmol/L — ABNORMAL HIGH (ref 20.0–28.0)
Drawn by: 11249
O2 Content: 3.5 L/min
O2 Saturation: 98.2 %
Patient temperature: 37
pCO2 arterial: 49 mmHg — ABNORMAL HIGH (ref 32–48)
pH, Arterial: 7.42 (ref 7.35–7.45)
pO2, Arterial: 70 mmHg — ABNORMAL LOW (ref 83–108)

## 2023-09-13 LAB — RESP PANEL BY RT-PCR (RSV, FLU A&B, COVID)  RVPGX2
Influenza A by PCR: NEGATIVE
Influenza B by PCR: NEGATIVE
Resp Syncytial Virus by PCR: NEGATIVE
SARS Coronavirus 2 by RT PCR: NEGATIVE

## 2023-09-13 LAB — BRAIN NATRIURETIC PEPTIDE: B Natriuretic Peptide: 85.7 pg/mL (ref 0.0–100.0)

## 2023-09-13 MED ORDER — IPRATROPIUM-ALBUTEROL 0.5-2.5 (3) MG/3ML IN SOLN
3.0000 mL | Freq: Once | RESPIRATORY_TRACT | Status: AC
  Administered 2023-09-13: 3 mL via RESPIRATORY_TRACT
  Filled 2023-09-13: qty 3

## 2023-09-13 MED ORDER — AZITHROMYCIN 250 MG PO TABS
500.0000 mg | ORAL_TABLET | Freq: Every day | ORAL | Status: AC
  Administered 2023-09-13 – 2023-09-15 (×3): 500 mg via ORAL
  Filled 2023-09-13 (×3): qty 2

## 2023-09-13 MED ORDER — METOPROLOL TARTRATE 25 MG PO TABS
25.0000 mg | ORAL_TABLET | Freq: Two times a day (BID) | ORAL | Status: DC
  Administered 2023-09-13 – 2023-09-17 (×8): 25 mg via ORAL
  Filled 2023-09-13 (×8): qty 1

## 2023-09-13 MED ORDER — ACETAMINOPHEN 500 MG PO TABS
1000.0000 mg | ORAL_TABLET | Freq: Four times a day (QID) | ORAL | Status: DC | PRN
  Administered 2023-09-14 – 2023-09-16 (×2): 1000 mg via ORAL
  Filled 2023-09-13 (×2): qty 2

## 2023-09-13 MED ORDER — SODIUM CHLORIDE 0.9% FLUSH: 3.0000 mL | INTRAVENOUS | Status: DC | PRN

## 2023-09-13 MED ORDER — ALBUTEROL SULFATE (2.5 MG/3ML) 0.083% IN NEBU: 2.5000 mg | INHALATION_SOLUTION | RESPIRATORY_TRACT | Status: DC | PRN

## 2023-09-13 MED ORDER — BUDESON-GLYCOPYRROL-FORMOTEROL 160-9-4.8 MCG/ACT IN AERO
2.0000 | INHALATION_SPRAY | Freq: Two times a day (BID) | RESPIRATORY_TRACT | Status: DC
  Administered 2023-09-13 – 2023-09-17 (×8): 2 via RESPIRATORY_TRACT
  Filled 2023-09-13: qty 5.9

## 2023-09-13 MED ORDER — SODIUM CHLORIDE 0.9 % IV SOLN
1.0000 g | INTRAVENOUS | Status: DC
  Administered 2023-09-13 – 2023-09-16 (×4): 1 g via INTRAVENOUS
  Filled 2023-09-13 (×4): qty 10

## 2023-09-13 MED ORDER — ALLOPURINOL 100 MG PO TABS
100.0000 mg | ORAL_TABLET | Freq: Every day | ORAL | Status: DC
  Administered 2023-09-14 – 2023-09-17 (×4): 100 mg via ORAL
  Filled 2023-09-13 (×4): qty 1

## 2023-09-13 MED ORDER — POLYETHYLENE GLYCOL 3350 17 G PO PACK
17.0000 g | PACK | Freq: Every day | ORAL | Status: DC | PRN
  Administered 2023-09-15: 17 g via ORAL
  Filled 2023-09-13: qty 1

## 2023-09-13 MED ORDER — IPRATROPIUM-ALBUTEROL 0.5-2.5 (3) MG/3ML IN SOLN
3.0000 mL | RESPIRATORY_TRACT | Status: DC | PRN
Start: 2023-09-13 — End: 2023-09-15
  Administered 2023-09-13 – 2023-09-15 (×3): 3 mL via RESPIRATORY_TRACT
  Filled 2023-09-13 (×3): qty 3

## 2023-09-13 MED ORDER — FUROSEMIDE 40 MG PO TABS
20.0000 mg | ORAL_TABLET | Freq: Every day | ORAL | Status: DC
  Administered 2023-09-14 – 2023-09-17 (×5): 20 mg via ORAL
  Filled 2023-09-13 (×5): qty 1

## 2023-09-13 MED ORDER — METHYLPREDNISOLONE SODIUM SUCC 125 MG IJ SOLR
125.0000 mg | Freq: Once | INTRAMUSCULAR | Status: AC
  Administered 2023-09-13: 125 mg via INTRAVENOUS
  Filled 2023-09-13: qty 2

## 2023-09-13 MED ORDER — SODIUM CHLORIDE 0.9% FLUSH
3.0000 mL | Freq: Two times a day (BID) | INTRAVENOUS | Status: DC
  Administered 2023-09-13 – 2023-09-17 (×8): 3 mL via INTRAVENOUS

## 2023-09-13 MED ORDER — APIXABAN 5 MG PO TABS
5.0000 mg | ORAL_TABLET | Freq: Two times a day (BID) | ORAL | Status: DC
  Administered 2023-09-13 – 2023-09-17 (×8): 5 mg via ORAL
  Filled 2023-09-13 (×9): qty 1

## 2023-09-13 MED ORDER — SODIUM CHLORIDE 0.9 % IV SOLN
250.0000 mL | INTRAVENOUS | Status: AC | PRN
Start: 2023-09-13 — End: 2023-09-14
  Administered 2023-09-13: 250 mL via INTRAVENOUS

## 2023-09-13 MED ORDER — QUETIAPINE FUMARATE 50 MG PO TABS
50.0000 mg | ORAL_TABLET | Freq: Every day | ORAL | Status: DC
  Administered 2023-09-14 – 2023-09-16 (×4): 50 mg via ORAL
  Filled 2023-09-13 (×4): qty 1

## 2023-09-13 MED ORDER — METHYLPREDNISOLONE SODIUM SUCC 40 MG IJ SOLR
40.0000 mg | Freq: Two times a day (BID) | INTRAMUSCULAR | Status: DC
  Administered 2023-09-14 – 2023-09-17 (×7): 40 mg via INTRAVENOUS
  Filled 2023-09-13 (×7): qty 1

## 2023-09-13 MED ORDER — POTASSIUM CHLORIDE CRYS ER 20 MEQ PO TBCR
40.0000 meq | EXTENDED_RELEASE_TABLET | ORAL | Status: AC
  Administered 2023-09-13 – 2023-09-14 (×2): 40 meq via ORAL
  Filled 2023-09-13 (×2): qty 2

## 2023-09-13 MED ORDER — PRAVASTATIN SODIUM 20 MG PO TABS
20.0000 mg | ORAL_TABLET | Freq: Every evening | ORAL | Status: DC
  Administered 2023-09-13 – 2023-09-16 (×4): 20 mg via ORAL
  Filled 2023-09-13 (×4): qty 1

## 2023-09-13 NOTE — ED Notes (Signed)
 Urine sample sent to the lab

## 2023-09-13 NOTE — ED Triage Notes (Signed)
 BIBA from home for cough, difficulty breathing x 2 days. Pt is on 2 lpm at all times. 10 of albuterol , 2 g magnesium , 1 of Atrovent  given PTA

## 2023-09-13 NOTE — H&P (Signed)
 History and Physical    Patient: Mitchell [name] QQV:[id number] DOB: 07-16-1940 DOA: 09/13/2023 DOS: the patient was seen and examined on 09/13/2023 PCP: Jimmey Mould, MD  Patient coming from: Home  Chief Complaint:  Chief Complaint  Patient presents with   Shortness of Breath   Cough   HPI: [name] is a 83 y.o. male with medical history significant of COPD on 2 L at home, CAD, CKD, hypertension, hyperlipidemia, legally blind, BPH and anxiety presented to the emergency department complaining of shortness of breath, cough and malaise for about 2 weeks on and off.  Over the past 2 to 3 days his shortness of breath has been severe that he has not been able to get out of the house.  Other associated symptoms include chest pain when taking a deep breath, he reports that his ribs hurt at all times.  He has been using his inhalers as prescribed and lately has been using albuterol  4-6 times a day.  He is having persistent cough with sputum.  He reports no fevers, leg swelling, chest pain or palpitations.  Patient lives by himself.  ER evaluation, desaturation to the 80s at 2 L, improved at 4 L nasal cannula to 93 to 95%.  Respiratory panel negative, WBC 11.6, K3.4, BNP and troponin negative.  EKG nonischemic.  Chest x-ray with no acute findings.  Patient was given IV Solu-Medrol , magnesium , albuterol  and Atrovent  with no significant improvement.  [facility] consulted to admit.   Review of Systems: As mentioned in the history of present illness. All other systems reviewed and are negative.   Past Medical History:  Diagnosis Date   Anxiety    BPH (benign prostatic hypertrophy)    CAD (coronary artery disease) 2007   with LAD/Diag CBPTCA and kissing    CKD (chronic kidney disease), stage III (HCC)    Maximo Spar 01/04/2017   COPD (chronic obstructive pulmonary disease) (HCC)    "maybe" (02/14/2012)   Depression    GERD (gastroesophageal reflux disease)    Hyperlipidemia    Hypertension     Hypoxemia    Myocardial infarction (HCC) 01/22/2012   NSTEMI with DES to RSA (PDA)   On home oxygen  therapy    Shortness of breath    "walking and laying down sometimes" (02/14/2012)   Past Surgical History:  Procedure Laterality Date   CATARACT EXTRACTION W/ INTRAOCULAR LENS  IMPLANT, BILATERAL  ~ 2010   CORONARY ANGIOPLASTY WITH STENT PLACEMENT  05/11/2005   PCI OF LAD   GANGLION CYST REMOVED  1956?   right   LEFT HEART CATHETERIZATION WITH CORONARY ANGIOGRAM Bilateral 01/24/2012   Procedure: LEFT HEART CATHETERIZATION WITH CORONARY ANGIOGRAM;  Surgeon: Mickiel Albany, MD;  Location: [facility] CATH LAB;  Service: Cardiovascular;  Laterality: Bilateral;   REFRACTIVE SURGERY  2010   left   Social History:  reports that he quit smoking about 12 years ago. His smoking use included pipe. He has never used smokeless tobacco. He reports that he does not currently use alcohol  after a past usage of about 7.0 standard drinks of alcohol  per week. He reports that he does not use drugs.  Allergies  Allergen Reactions   Lipitor  [Atorvastatin ] Other (See Comments)    Severe myalgias   Crestor  [Rosuvastatin ] Other (See Comments)    Myalgias    Norco [Hydrocodone -Acetaminophen ] Hives    Family History  Problem Relation Age of Onset   Heart failure Mother    Heart failure Brother  Prior to Admission medications   Medication Sig Start Date End Date Taking? Authorizing Provider  albuterol  (PROVENTIL  HFA;VENTOLIN  HFA) 108 (90 BASE) MCG/ACT inhaler Inhale 2 puffs into the lungs every 6 (six) hours as needed for wheezing. Reported on 05/04/2015    [provider]  allopurinol  (ZYLOPRIM ) 100 MG tablet Take 100 mg by mouth in the morning. 11/07/20   [provider]  ALPRAZolam  (XANAX ) 0.25 MG tablet Take 0.25 mg by mouth daily as needed for anxiety.    [provider]  apixaban  (ELIQUIS ) 5 MG TABS tablet Take 1 tablet (5 mg total) by mouth 2 (two) times daily. 03/18/23    Tolia, Sunit, DO  aspirin  EC 81 MG tablet Take 81 mg by mouth daily. Swallow whole.    [provider]  azithromycin  (ZITHROMAX ) 250 MG tablet Take as directed 12/28/22   Wilfredo Hanly, MD  Budeson-Glycopyrrol-Formoterol  (BREZTRI  AEROSPHERE) 160-9-4.8 MCG/ACT AERO Inhale 2 puffs into the lungs in the morning and at bedtime. 02/21/22   Wilfredo Hanly, MD  Budeson-Glycopyrrol-Formoterol  (BREZTRI  AEROSPHERE) 160-9-4.8 MCG/ACT AERO Inhale 2 puffs into the lungs in the morning and at bedtime. 09/26/22   Wilfredo Hanly, MD  Budeson-Glycopyrrol-Formoterol  (BREZTRI  AEROSPHERE) 160-9-4.8 MCG/ACT AERO Inhale 2 puffs into the lungs in the morning and at bedtime. 11/12/22   Wilfredo Hanly, MD  doxycycline  (VIBRAMYCIN ) 100 MG capsule Take 1 capsule (100 mg total) by mouth 2 (two) times daily. One po bid x 7 days 06/12/23   Jerilynn Montenegro, MD  finasteride  (PROSCAR ) 5 MG tablet Take 5 mg by mouth in the morning. Reported on 05/04/2015    [provider]  furosemide  (LASIX ) 20 MG tablet Take by mouth daily.    [provider]  metoprolol  tartrate (LOPRESSOR ) 25 MG tablet TAKE 1 TABLET TWICE DAILY Patient taking differently: Take 25 mg by mouth 2 (two) times daily. 03/22/20   Arty Binning, MD  oxyCODONE  (ROXICODONE ) 5 MG immediate release tablet Take 0.5-1 tablets (2.5-5 mg total) by mouth every 6 (six) hours as needed for severe pain (pain score 7-10). 06/12/23   Jerilynn Montenegro, MD  Phenylephrine-Acetaminophen  (TYLENOL  SINUS+HEADACHE PO) Take 1 tablet by mouth 2 (two) times daily as needed (sinus pressure/pain).    [provider]  potassium chloride  (KLOR-CON ) 10 MEQ tablet Take 10 mEq by mouth in the morning.    [provider]  pravastatin  (PRAVACHOL ) 20 MG tablet Take 1 tablet (20 mg total) by mouth every evening. 11/17/21   Arty Binning, MD  QUEtiapine  (SEROQUEL ) 50 MG tablet Take 50 mg by mouth at bedtime. 03/12/23   [provider]     Physical Exam: Vitals:   09/13/23 1606 09/13/23 1700 09/13/23 2034  BP: 133/78 128/67 (!) 165/103  Pulse: 77 72 74  Resp: 20 12 16   Temp: 98.7 F (37.1 C)  98.1 F (36.7 C)  TempSrc: Oral  Oral  SpO2: 96% 93% 95%    Constitutional: Chronically ill, mild distress. Eyes: Conjunctivae normal ENMT: Mucous membranes are dry.  Neck: normal, supple, no masses, no thyromegaly Respiratory: Decreased air entry bilaterally, wheezing throughout.  Rhonchi at the bases.  Bronchial cough.  Chest tenderness to palpation. Cardiovascular: Regular rate and rhythm, bilateral lower extremity trace edema.  2+ pedal pulses. No carotid bruits.  Abdomen: no tenderness, no masses palpated. No hepatosplenomegaly. Bowel sounds positive.  Musculoskeletal: no clubbing / cyanosis. No joint deformity upper and lower extremities. Good ROM, no contractures. Normal muscle tone.  Skin: no rashes,  lesions, ulcers. No induration Neurologic: CN 2-12 grossly intact. Strength 5/5 in all 4.  Psychiatric: Normal judgment and insight. Alert and oriented x 3. Normal mood.     Data Reviewed: There are no new results to review at this time.  Assessment and Plan: Service: Hospitalist  #Acute on chronic respiratory failure due to COPD exacerbation/bronchitis Will place in observation, start Solu-Medrol  every 12 hours, DuoNebs every 6 hrs as needed.  Start Rocephin  and azithromycin .  Continue with Breztri .  Will add blood cultures and ABG.  Wean oxygen  as tolerated to baseline which is 2 L nasal cannula.  Recommend PT evaluation for safety discharge.  #CAD Chronic, troponins and BNP are negative.  EKG with no signs of ischemia.  Patient is on Eliquis , continue home medications.  #Hypertension Chronic, blood pressure elevated, likely from acute process.  Continue home dose metoprolol .   #Hyperlipidemia Continue statin.  #Hypokalemia K. Dur 40 mill equivalents x 2.  Check BMP and magnesium  in the  morning.  #Insomnia QTc stable, continue Seroquel .   DVT prophylaxis: On Eliquis    Advance Care Planning:   Code Status: Do not attempt resuscitation (DNR) PRE-ARREST INTERVENTIONS DESIRED, discussed with patient, he reports that he will not survive compressions and he is not able to be put asleep.  Consults: None  Family Communication: None   Severity of Illness: The appropriate patient status for this patient is OBSERVATION. Observation status is judged to be reasonable and necessary in order to provide the required intensity of service to ensure the patient's safety. The patient's presenting symptoms, physical exam findings, and initial radiographic and laboratory data in the context of their medical condition is felt to place them at decreased risk for further clinical deterioration. Furthermore, it is anticipated that the patient will be medically stable for discharge from the hospital within 2 midnights of admission.   Author: Dora Gallo, MD 09/13/2023 9:51 PM  For on call review [email or web address].

## 2023-09-14 DIAGNOSIS — E785 Hyperlipidemia, unspecified: Secondary | ICD-10-CM | POA: Diagnosis present

## 2023-09-14 DIAGNOSIS — R269 Unspecified abnormalities of gait and mobility: Secondary | ICD-10-CM | POA: Diagnosis not present

## 2023-09-14 DIAGNOSIS — Z7982 Long term (current) use of aspirin: Secondary | ICD-10-CM | POA: Diagnosis not present

## 2023-09-14 DIAGNOSIS — Z87891 Personal history of nicotine dependence: Secondary | ICD-10-CM | POA: Diagnosis not present

## 2023-09-14 DIAGNOSIS — J449 Chronic obstructive pulmonary disease, unspecified: Secondary | ICD-10-CM | POA: Diagnosis not present

## 2023-09-14 DIAGNOSIS — I129 Hypertensive chronic kidney disease with stage 1 through stage 4 chronic kidney disease, or unspecified chronic kidney disease: Secondary | ICD-10-CM | POA: Diagnosis present

## 2023-09-14 DIAGNOSIS — F32A Depression, unspecified: Secondary | ICD-10-CM | POA: Diagnosis present

## 2023-09-14 DIAGNOSIS — Z66 Do not resuscitate: Secondary | ICD-10-CM | POA: Diagnosis present

## 2023-09-14 DIAGNOSIS — Z955 Presence of coronary angioplasty implant and graft: Secondary | ICD-10-CM | POA: Diagnosis not present

## 2023-09-14 DIAGNOSIS — Z7901 Long term (current) use of anticoagulants: Secondary | ICD-10-CM | POA: Diagnosis not present

## 2023-09-14 DIAGNOSIS — E876 Hypokalemia: Secondary | ICD-10-CM | POA: Diagnosis present

## 2023-09-14 DIAGNOSIS — G629 Polyneuropathy, unspecified: Secondary | ICD-10-CM | POA: Diagnosis present

## 2023-09-14 DIAGNOSIS — N400 Enlarged prostate without lower urinary tract symptoms: Secondary | ICD-10-CM | POA: Diagnosis present

## 2023-09-14 DIAGNOSIS — N183 Chronic kidney disease, stage 3 unspecified: Secondary | ICD-10-CM | POA: Diagnosis present

## 2023-09-14 DIAGNOSIS — Z1152 Encounter for screening for COVID-19: Secondary | ICD-10-CM | POA: Diagnosis not present

## 2023-09-14 DIAGNOSIS — K219 Gastro-esophageal reflux disease without esophagitis: Secondary | ICD-10-CM | POA: Diagnosis present

## 2023-09-14 DIAGNOSIS — I252 Old myocardial infarction: Secondary | ICD-10-CM | POA: Diagnosis not present

## 2023-09-14 DIAGNOSIS — Z8249 Family history of ischemic heart disease and other diseases of the circulatory system: Secondary | ICD-10-CM | POA: Diagnosis not present

## 2023-09-14 DIAGNOSIS — G4700 Insomnia, unspecified: Secondary | ICD-10-CM | POA: Diagnosis present

## 2023-09-14 DIAGNOSIS — H548 Legal blindness, as defined in USA: Secondary | ICD-10-CM | POA: Diagnosis present

## 2023-09-14 DIAGNOSIS — N179 Acute kidney failure, unspecified: Secondary | ICD-10-CM | POA: Diagnosis present

## 2023-09-14 DIAGNOSIS — I2510 Atherosclerotic heart disease of native coronary artery without angina pectoris: Secondary | ICD-10-CM | POA: Diagnosis present

## 2023-09-14 DIAGNOSIS — J9621 Acute and chronic respiratory failure with hypoxia: Secondary | ICD-10-CM | POA: Diagnosis present

## 2023-09-14 DIAGNOSIS — J441 Chronic obstructive pulmonary disease with (acute) exacerbation: Secondary | ICD-10-CM | POA: Diagnosis present

## 2023-09-14 DIAGNOSIS — F419 Anxiety disorder, unspecified: Secondary | ICD-10-CM | POA: Diagnosis present

## 2023-09-14 DIAGNOSIS — I4892 Unspecified atrial flutter: Secondary | ICD-10-CM | POA: Diagnosis present

## 2023-09-14 LAB — CBC WITH DIFFERENTIAL/PLATELET
Abs Immature Granulocytes: 0.02 10*3/uL (ref 0.00–0.07)
Basophils Absolute: 0 10*3/uL (ref 0.0–0.1)
Basophils Relative: 0 %
Eosinophils Absolute: 0 10*3/uL (ref 0.0–0.5)
Eosinophils Relative: 0 %
HCT: 47.4 % (ref 39.0–52.0)
Hemoglobin: 14.9 g/dL (ref 13.0–17.0)
Immature Granulocytes: 0 %
Lymphocytes Relative: 4 %
Lymphs Abs: 0.3 10*3/uL — ABNORMAL LOW (ref 0.7–4.0)
MCH: 28.8 pg (ref 26.0–34.0)
MCHC: 31.4 g/dL (ref 30.0–36.0)
MCV: 91.7 fL (ref 80.0–100.0)
Monocytes Absolute: 0.1 10*3/uL (ref 0.1–1.0)
Monocytes Relative: 1 %
Neutro Abs: 8.2 10*3/uL — ABNORMAL HIGH (ref 1.7–7.7)
Neutrophils Relative %: 95 %
Platelets: 152 10*3/uL (ref 150–400)
RBC: 5.17 MIL/uL (ref 4.22–5.81)
RDW: 14.9 % (ref 11.5–15.5)
WBC: 8.6 10*3/uL (ref 4.0–10.5)
nRBC: 0 % (ref 0.0–0.2)

## 2023-09-14 LAB — BASIC METABOLIC PANEL WITH GFR
Anion gap: 11 (ref 5–15)
BUN: 20 mg/dL (ref 8–23)
CO2: 27 mmol/L (ref 22–32)
Calcium: 8.1 mg/dL — ABNORMAL LOW (ref 8.9–10.3)
Chloride: 98 mmol/L (ref 98–111)
Creatinine, Ser: 1.25 mg/dL — ABNORMAL HIGH (ref 0.61–1.24)
GFR, Estimated: 57 mL/min — ABNORMAL LOW (ref >60)
Glucose, Bld: 247 mg/dL — ABNORMAL HIGH (ref 70–99)
Potassium: 3.9 mmol/L (ref 3.5–5.1)
Sodium: 136 mmol/L (ref 135–145)

## 2023-09-14 LAB — CBC
HCT: 50 % (ref 39.0–52.0)
Hemoglobin: 15.4 g/dL (ref 13.0–17.0)
MCH: 28.8 pg (ref 26.0–34.0)
MCHC: 30.8 g/dL (ref 30.0–36.0)
MCV: 93.5 fL (ref 80.0–100.0)
Platelets: 173 K/uL (ref 150–400)
RBC: 5.35 MIL/uL (ref 4.22–5.81)
RDW: 14.7 % (ref 11.5–15.5)
WBC: 10 K/uL (ref 4.0–10.5)
nRBC: 0 % (ref 0.0–0.2)

## 2023-09-14 LAB — MAGNESIUM: Magnesium: 2.5 mg/dL — ABNORMAL HIGH (ref 1.7–2.4)

## 2023-09-14 MED ORDER — ADULT MULTIVITAMIN W/MINERALS CH
1.0000 | ORAL_TABLET | Freq: Every day | ORAL | Status: DC
  Administered 2023-09-14 – 2023-09-17 (×4): 1 via ORAL
  Filled 2023-09-14 (×4): qty 1

## 2023-09-14 MED ORDER — GABAPENTIN 400 MG PO CAPS
600.0000 mg | ORAL_CAPSULE | Freq: Every day | ORAL | Status: DC
  Administered 2023-09-14 – 2023-09-16 (×3): 600 mg via ORAL
  Filled 2023-09-14 (×3): qty 2

## 2023-09-14 MED ORDER — HYDROCOD POLI-CHLORPHE POLI ER 10-8 MG/5ML PO SUER
2.5000 mL | Freq: Once | ORAL | Status: AC
  Administered 2023-09-14: 2.5 mL via ORAL
  Filled 2023-09-14: qty 5

## 2023-09-14 MED ORDER — HYDROCOD POLI-CHLORPHE POLI ER 10-8 MG/5ML PO SUER
5.0000 mL | Freq: Two times a day (BID) | ORAL | Status: AC
  Administered 2023-09-14 – 2023-09-16 (×4): 5 mL via ORAL
  Filled 2023-09-14 (×4): qty 5

## 2023-09-14 MED ORDER — ENSURE PLUS HIGH PROTEIN PO LIQD
237.0000 mL | Freq: Two times a day (BID) | ORAL | Status: DC
  Administered 2023-09-16 (×2): 237 mL via ORAL

## 2023-09-14 NOTE — Evaluation (Signed)
 Physical Therapy Evaluation Patient Details Name: Mitchell [name] MRN: [id number] DOB: 1940/06/17 Today's Date: 09/14/2023  History of Present Illness  Mitchell [name] is a 83 yr old male brought to the hospital with shortness of breath, cough, and malaise which has been ongoing for ~2 weeks. He was found to have acute on chronic respiratory failure due to COPD exacerbation. PMH: COPD on O2, CAD, CKD, HTN, HLD, legally blind, BPH, anxiety, NSTEMI  Clinical Impression  Pt admitted as above and presenting with functional mobility limitations 2* generalized weakness, decreased activity tolerance, balance deficits and visual deficits.  This date, pt requiring min assist OOB and to feet but able to ambulate 100'+ in hall with bil SPC and progressing to CGA needed for significant visual deficits.  Pt maintained O2 sats at 95% on 2L O2 but distance ltd by fatigue.  Pt could benefit from follow up HHPT but adamantly refuses same.      If plan is discharge home, recommend the following: A little help with walking and/or transfers;A little help with bathing/dressing/bathroom;Assistance with cooking/housework;Assist for transportation;Help with stairs or ramp for entrance   Can travel by private vehicle        Equipment Recommendations None recommended by PT  Recommendations for Other Services       Functional Status Assessment Patient has had a recent decline in their functional status and demonstrates the ability to make significant improvements in function in a reasonable and predictable amount of time.     Precautions / Restrictions Precautions Precautions: Fall Restrictions Weight Bearing Restrictions Per Provider Order: No      Mobility  Bed Mobility Overal bed mobility: Needs Assistance Bed Mobility: Supine to Sit     Supine to sit: Min assist     General bed mobility comments: min assist to bring trunk to upright    Transfers Overall transfer level: Needs assistance Equipment  used: Straight cane Transfers: Sit to/from Stand Sit to Stand: Min assist           General transfer comment: Min assist to bring wt up and fwd and balance in initial standing from EOB    Ambulation/Gait Ambulation/Gait assistance: Min assist, Contact guard assist Gait Distance (Feet): 110 Feet Assistive device: Straight cane (bilat straight canes) Gait Pattern/deviations: Step-through pattern, Decreased step length - right, Decreased step length - left, Shuffle, Trunk flexed Gait velocity: decr     General Gait Details: initial steady assist but progressed to min CGA 2* visual deficits.  Distance ltd by fatigue - O2 sat 95% on 2L  Stairs            Wheelchair Mobility     Tilt Bed    Modified Rankin (Stroke Patients Only)       Balance Overall balance assessment: Needs assistance Sitting-balance support: No upper extremity supported, Feet supported Sitting balance-Leahy Scale: Good     Standing balance support: Bilateral upper extremity supported Standing balance-Leahy Scale: Poor Standing balance comment: CGA                             Pertinent Vitals/Pain Pain Assessment Pain Assessment: No/denies pain    Home Living Family/patient expects to be discharged to:: Private residence Living Arrangements: Alone Available Help at Discharge: Family;Available PRN/intermittently Type of Home: Apartment Home Access: Level entry       Home Layout: One level Home Equipment: Rollator (4 wheels);Wheelchair - power;BSC/3in1;Cane - single point  Prior Function Prior Level of Function : Needs assist             Mobility Comments: Pt using bil canes for ambulation ADLs Comments: He was modified independent to independent with ADLs, he does not drive, and his ex-spouse stopped by regularly to assist him with cleaning, laundry, and taking him to appointments.     Extremity/Trunk Assessment   Upper Extremity Assessment Upper Extremity  Assessment: Overall WFL for tasks assessed    Lower Extremity Assessment Lower Extremity Assessment: Generalized weakness       Communication   Communication Communication: Impaired Factors Affecting Communication: Hearing impaired    Cognition Arousal: Alert Behavior During Therapy: WFL for tasks assessed/performed                             Following commands: Intact       Cueing Cueing Techniques: Verbal cues     General Comments      Exercises     Assessment/Plan    PT Assessment Patient needs continued PT services  PT Problem List Decreased strength;Decreased range of motion;Decreased activity tolerance;Decreased balance;Decreased mobility;Decreased knowledge of use of DME;Obesity       PT Treatment Interventions DME instruction;Gait training;Stair training;Functional mobility training;Therapeutic activities;Therapeutic exercise;Patient/family education;Balance training    PT Goals (Current goals can be found in the Care Plan section)  Acute Rehab PT Goals Patient Stated Goal: HOME PT Goal Formulation: With patient Time For Goal Achievement: 09/26/23 Potential to Achieve Goals: Fair    Frequency Min 3X/week     Co-evaluation               AM-PAC PT "6 Clicks" Mobility  Outcome Measure Help needed turning from your back to your side while in a flat bed without using bedrails?: None Help needed moving from lying on your back to sitting on the side of a flat bed without using bedrails?: A Little Help needed moving to and from a bed to a chair (including a wheelchair)?: A Little Help needed standing up from a chair using your arms (e.g., wheelchair or bedside chair)?: A Little Help needed to walk in hospital room?: A Little Help needed climbing 3-5 steps with a railing? : A Lot 6 Click Score: 18    End of Session Equipment Utilized During Treatment: Gait belt;Oxygen  Activity Tolerance: Patient tolerated treatment well Patient left:  in chair;with call bell/phone within reach;with nursing/sitter in room;with chair alarm set;with family/visitor present Nurse Communication: Mobility status PT Visit Diagnosis: Difficulty in walking, not elsewhere classified (R26.2)    Time: 1610-9604 PT Time Calculation (min) (ACUTE ONLY): 32 min   Charges:   PT Evaluation $PT Eval Low Complexity: 1 Low   PT General Charges $$ ACUTE PT VISIT: 1 Visit         Mitchell [name] PT Acute Rehabilitation Services Pager [phone] Office [phone]   Mitchell [name] 09/14/2023, 4:57 PM
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
## 2023-09-14 NOTE — Progress Notes (Signed)
 PROGRESS NOTE    WOODLEY PETZOLD  WUJ:[id number] DOB: 24-Jan-1941 DOA: 09/13/2023 PCP: Jimmey Mould, MD   Brief Narrative:  Mitchell [name] is a 83 y.o. male who presents from home with known medical history significant of COPD on 2 L at baseline, CAD, CKD, hypertension, hyperlipidemia, legally blind, BPH and anxiety with complaints of worsening shortness of breath cough and general malaise worsening over the past 2 weeks, most notably over the past 72 hours with "intractable coughing" causing muscle soreness.  In the ED patient found to be hypoxic on baseline oxygen  even at rest - hospitalist called for admission.  Assessment & Plan:   Active Problems:   COPD exacerbation (HCC)  Acute on chronic respiratory failure due to COPD exacerbation Rule out pneumonia -Continues to require oxygen  above baseline - Continue nebs, steroids, ceftriaxone , azithromycin  - Resume home Breztri  - Cultures pending, chest x-ray without overt focal pneumonia  AKI Hypokalemia - Recommend increased p.o. intake, hold IV fluids at this time, urine output remains appropriate - Replete potassium as appropriate, follow repeat labs -currently within normal limits  CAD Paroxysmal A-flutter Hypertension Hyperlipidemia - ACS ruled out, unremarkable EKG, troponin, BNP within normal limits - Continue Eliquis  - Resume home medications as appropriate (metoprolol , furosemide , statin)  Peripheral neuropathy, severe - Resume home gabapentin  Insomnia - Continue Seroquel   DVT prophylaxis:  apixaban  (ELIQUIS ) tablet 5 mg  Code Status:   Code Status: Do not attempt resuscitation (DNR) PRE-ARREST INTERVENTIONS DESIRED Family Communication: Ex-wife at bedside  Status is: Inpatient  Dispo: The patient is from: Home              Anticipated d/c is to: Home              Anticipated d/c date is: 24 to 48 hours              Patient currently not medically stable for discharge  Consultants:   None  Procedures:  None  Antimicrobials:  Azithromycin , ceftriaxone   Subjective: No acute issues or events overnight, respiratory status appears to be stabilizing if not minimally improving.  Otherwise denies nausea vomiting diarrhea constipation headache fevers chills or chest pain.  Objective: Vitals:   09/13/23 2145 09/13/23 2247 09/14/23 0218 09/14/23 0611  BP: (!) 146/82 138/69 132/71 (!) 121/58  Pulse: 82 86 78 64  Resp: 16 16 16 17   Temp:  98.1 F (36.7 C) 97.6 F (36.4 C) (!) 97.5 F (36.4 C)  TempSrc:  Oral Oral Oral  SpO2: 94% 93% 96% 93%    Intake/Output Summary (Last 24 hours) at 09/14/2023 0748 Last data filed at 09/14/2023 9562 Gross per 24 hour  Intake 812.83 ml  Output 300 ml  Net 512.83 ml   There were no vitals filed for this visit.  Examination:  General:  Pleasantly resting in bed, No acute distress. HEENT:  Normocephalic atraumatic.  Sclerae nonicteric, noninjected.  Extraocular movements intact bilaterally. Neck:  Without mass or deformity.  Trachea is midline. Lungs: Diminished bilaterally without overt wheeze or rales. Heart:  Regular rate and rhythm.  Without murmurs, rubs, or gallops. Abdomen:  Soft, nontender, nondistended.  Without guarding or rebound. Extremities: Profound distal neuropathy.  Data Reviewed: I have personally reviewed following labs and imaging studies  CBC: Recent Labs  Lab 09/13/23 1621 09/14/23 0021  WBC 11.6* 8.6  NEUTROABS 8.7* 8.2*  HGB 14.8 14.9  HCT 48.0 47.4  MCV 93.9 91.7  PLT 159 152   Basic Metabolic Panel: Recent Labs  Lab 09/13/23 1820 09/14/23 0021  NA 139 136  K 3.4* 3.9  CL 109 98  CO2 24 27  GLUCOSE 107* 247*  BUN 16 20  CREATININE 0.86 1.25*  CALCIUM  6.6* 8.1*  MG  --  2.5*   GFR: CrCl cannot be calculated (Unknown ideal weight.).  Liver Function Tests: Recent Labs  Lab 09/13/23 1820  AST 16  ALT 14  ALKPHOS 52  BILITOT 0.9  PROT 4.9*  ALBUMIN 2.7*   BNP (last 3  results) Recent Labs    01/15/23 0949  PROBNP 288    Recent Results (from the past 240 hours)  Resp panel by RT-PCR (RSV, Flu A&B, Covid) Anterior Nasal Swab     Status: None   Collection Time: 09/13/23  4:21 PM   Specimen: Anterior Nasal Swab  Result Value Ref Range Status   SARS Coronavirus 2 by RT PCR NEGATIVE NEGATIVE Final    Comment: (NOTE) SARS-CoV-2 target nucleic acids are NOT DETECTED.  The SARS-CoV-2 RNA is generally detectable in upper respiratory specimens during the acute phase of infection. The lowest concentration of SARS-CoV-2 viral copies this assay can detect is 138 copies/mL. A negative result does not preclude SARS-Cov-2 infection and should not be used as the sole basis for treatment or other patient management decisions. A negative result may occur with  improper specimen collection/handling, submission of specimen other than nasopharyngeal swab, presence of viral mutation(s) within the areas targeted by this assay, and inadequate number of viral copies(<138 copies/mL). A negative result must be combined with clinical observations, patient history, and epidemiological information. The expected result is Negative.  Fact Sheet for Patients:  BloggerCourse.com  Fact Sheet for Healthcare Providers:  SeriousBroker.it  This test is no t yet approved or cleared by the [place]  FDA and  has been authorized for detection and/or diagnosis of SARS-CoV-2 by FDA under an Emergency Use Authorization (EUA). This EUA will remain  in effect (meaning this test can be used) for the duration of the COVID-19 declaration under Section 564(b)(1) of the Act, 21 U.S.C.section 360bbb-3(b)(1), unless the authorization is terminated  or revoked sooner.       Influenza A by PCR NEGATIVE NEGATIVE Final   Influenza B by PCR NEGATIVE NEGATIVE Final    Comment: (NOTE) The Xpert Xpress SARS-CoV-2/FLU/RSV plus assay is intended  as an aid in the diagnosis of influenza from Nasopharyngeal swab specimens and should not be used as a sole basis for treatment. Nasal washings and aspirates are unacceptable for Xpert Xpress SARS-CoV-2/FLU/RSV testing.  Fact Sheet for Patients: BloggerCourse.com  Fact Sheet for Healthcare Providers: SeriousBroker.it  This test is not yet approved or cleared by the [place]  FDA and has been authorized for detection and/or diagnosis of SARS-CoV-2 by FDA under an Emergency Use Authorization (EUA). This EUA will remain in effect (meaning this test can be used) for the duration of the COVID-19 declaration under Section 564(b)(1) of the Act, 21 U.S.C. section 360bbb-3(b)(1), unless the authorization is terminated or revoked.     Resp Syncytial Virus by PCR NEGATIVE NEGATIVE Final    Comment: (NOTE) Fact Sheet for Patients: BloggerCourse.com  Fact Sheet for Healthcare Providers: SeriousBroker.it  This test is not yet approved or cleared by the [place]  FDA and has been authorized for detection and/or diagnosis of SARS-CoV-2 by FDA under an Emergency Use Authorization (EUA). This EUA will remain in effect (meaning this test can be used) for the duration of the COVID-19 declaration under Section 564(b)(1) of  the Act, 21 U.S.C. section 360bbb-3(b)(1), unless the authorization is terminated or revoked.  Performed at [facility], [address], [place], [place] [address]          Radiology Studies: [facility] Chest Port 1 View Result Date: 09/13/2023 CLINICAL DATA:  Shortness of breath and cough. EXAM: PORTABLE CHEST 1 VIEW COMPARISON:  03/24/2022. FINDINGS: Patient is rotated to the right. The heart size and mediastinal contours are otherwise within normal limits. No focal consolidation, sizeable pleural effusion, or pneumothorax. No acute osseous abnormality.  IMPRESSION: No acute cardiopulmonary findings. Electronically Signed   By: Mannie Seek M.D.   On: 09/13/2023 18:43        Scheduled Meds:  allopurinol   100 mg Oral Daily   apixaban   5 mg Oral BID   azithromycin   500 mg Oral Daily   budesonide -glycopyrrolate -formoterol   2 puff Inhalation BID   furosemide   20 mg Oral Daily   methylPREDNISolone  (SOLU-MEDROL ) injection  40 mg Intravenous Q12H   metoprolol  tartrate  25 mg Oral BID   pravastatin   20 mg Oral QPM   QUEtiapine   50 mg Oral QHS   sodium chloride  flush  3 mL Intravenous Q12H   Continuous Infusions:  sodium chloride  250 mL (09/13/23 2300)   cefTRIAXone  (ROCEPHIN )  IV 1 g (09/13/23 2257)     LOS: 0 days   Time spent:  Haydee Lipa, DO [facility]  If 7PM-7AM, please contact night-coverage [email or web address]  09/14/2023, 7:48 AM

## 2023-09-14 NOTE — Progress Notes (Signed)
 Initial Nutrition Assessment  DOCUMENTATION CODES:   Not applicable  INTERVENTION:   -Obtain new wt -Liberalize diet to regular for widest variety of meal selections -Ensure Plus High Protein po BID, each supplement provides 350 kcal and 20 grams of protein.  -MVI with minerals daily -Feeding assistance with meals  NUTRITION DIAGNOSIS:   Increased nutrient needs related to chronic illness (COPD) as evidenced by estimated needs.  GOAL:   Patient will meet greater than or equal to 90% of their needs  MONITOR:   Supplement acceptance, PO intake  REASON FOR ASSESSMENT:   Consult Assessment of nutrition requirement/status  ASSESSMENT:   Pt with medical history significant of COPD on 2 L at home, CAD, CKD, hypertension, hyperlipidemia, legally blind, BPH and anxiety presented to the emergency department complaining of shortness of breath, cough and malaise for about 2 weeks PTA on and off.  Pt admitted with COPD exacerbation.  Reviewed I/O's: +513 ml x 24 hours  UOP: 300 ml x 24 hours  Pt unavailable at time of visit. Attempted to speak with pt via call to hospital room phone, however, unable to reach. RD unable to obtain further nutrition-related history or complete nutrition-focused physical exam at this time.    Per H&P, shortness of breath was so severe over the past 2-3 days PTA that he was unable to leave his house.  Pt currently on a heart healthy diet. No meal completion data available to assess at this time.   No wt loss noted, however, last wt recorded on 01/2024. RD will request new wt to better assess weight changes.   Pt with increased nutritional needs and wold benefit from addition of oral nutrition supplements.   Medications reviewed and include lasix , neurontin, and solu-medrol .  Labs reviewed: Mg: 2.5, CBGS: 187 (inpatient orders for glycemic control are none).    Diet Order:   Diet Order             Diet Heart Room service appropriate? Yes;  Fluid consistency: Thin  Diet effective now                   EDUCATION NEEDS:   No education needs have been identified at this time  Skin:  Skin Assessment: Reviewed RN Assessment  Last BM:  09/13/23  Height:   Ht Readings from Last 1 Encounters:  01/15/23 5\' 9"  (1.753 m)    Weight:   Wt Readings from Last 1 Encounters:  01/15/23 102.5 kg    Ideal Body Weight:  72.7 kg  BMI:  There is no height or weight on file to calculate BMI.  Estimated Nutritional Needs:   Kcal:  1950-2150  Protein:  90-105 grams  Fluid:  1.9-2.1 L    Herschel Lords, RD, LDN, CDCES Registered Dietitian III Certified Diabetes Care and Education Specialist If unable to reach this RD, please use "RD Inpatient" group chat on secure chat between hours of 8am-4 pm daily

## 2023-09-14 NOTE — Plan of Care (Signed)
 ?  Problem: Clinical Measurements: ?Goal: Ability to maintain clinical measurements within normal limits will improve ?Outcome: Progressing ?Goal: Will remain free from infection ?Outcome: Progressing ?Goal: Diagnostic test results will improve ?Outcome: Progressing ?  ?

## 2023-09-14 NOTE — Care Management Obs Status (Signed)
 MEDICARE OBSERVATION STATUS NOTIFICATION   Patient Details  Name: Mitchell [name] MRN: [id number] Date of Birth: 1940-04-29   Medicare Observation Status Notification Given:  Yes    Levie Ream, RN 09/14/2023, 11:07 AM

## 2023-09-14 NOTE — Evaluation (Signed)
 Occupational Therapy Evaluation Patient Details Name: Mitchell [name] MRN: [id number] DOB: 03/04/1941 Today's Date: 09/14/2023   History of Present Illness   Mr. Cahalan is a 83 yr old male brought to the hospital with shortness of breath, cough, and malaise which has been ongoing for ~2 weeks. He was found to have acute on chronic respiratory failure due to COPD exacerbation. PMH: COPD on O2, CAD, CKD, HTN, HLD, legally blind, BPH, anxiety, NSTEMI     Clinical Impressions The pt is currently presenting with the below listed deficits (see OT problem list). As such, his occupational performance is compromised and he requires assist for tasks, including dressing and toileting. During the session, today, he also reported feelings of increased fatigue with activity and generalized weakness. He has a history of neuropathy, causing discomfort and a pins and needles sensation to his feet. He further stated he can cook at home, though with some occasional difficulty, due to his vision impairment. He will benefit from further OT services to maximize his safety and independence with self-care tasks. Home health OT is recommended, however the pt expressed a disinterest in such services.      If plan is discharge home, recommend the following:   Assistance with cooking/housework;Assist for transportation;Direct supervision/assist for medications management;A little help with bathing/dressing/bathroom     Functional Status Assessment   Patient has had a recent decline in their functional status and demonstrates the ability to make significant improvements in function in a reasonable and predictable amount of time.     Equipment Recommendations   None recommended by OT     Recommendations for Other Services         Precautions/Restrictions   Restrictions Weight Bearing Restrictions Per Provider Order: No     Mobility Bed Mobility               General bed mobility comments: pt  was received out of bed    Transfers Overall transfer level: Needs assistance Equipment used: None Transfers: Sit to/from Stand Sit to Stand: Contact guard assist                  Balance       Sitting balance - Comments: static sitting-good. dynamic sitting-fair+       Standing balance comment: CGA           ADL either performed or assessed with clinical judgement   ADL Overall ADL's : Needs assistance/impaired Eating/Feeding: Set up;Sitting Eating/Feeding Details (indicate cue type and reason): requires set-up assist due to vision impairment Grooming: Set up;Sitting   Upper Body Bathing: Set up;Sitting   Lower Body Bathing: Minimal assistance;Sitting/lateral leans   Upper Body Dressing : Set up;Sitting   Lower Body Dressing: Moderate assistance Lower Body Dressing Details (indicate cue type and reason): for sock management in sitting                     Vision Baseline Vision/History: 2 Legally blind              Pertinent Vitals/Pain Pain Assessment Pain Assessment: No/denies pain     Extremity/Trunk Assessment Upper Extremity Assessment Upper Extremity Assessment: Overall WFL for tasks assessed   Lower Extremity Assessment Lower Extremity Assessment: Overall WFL for tasks assessed       Communication Communication Factors Affecting Communication: Hearing impaired   Cognition Arousal: Alert Behavior During Therapy: WFL for tasks assessed/performed  Following commands: Intact  Home Living Family/patient expects to be discharged to:: Private residence Living Arrangements: Alone Available Help at Discharge: Family;Available PRN/intermittently Type of Home: Apartment Home Access: Level entry     Home Layout: One level     Bathroom Shower/Tub: Tub/shower unit         Home Equipment: Rollator (4 wheels);Wheelchair - power;BSC/3in1;Cane - single point          Prior Functioning/Environment Prior  Level of Function : Needs assist             Mobility Comments: He used a cane for household ambulation. ADLs Comments: He was modified independent to independent with ADLs, he does not drive, and his ex-spouse stopped by regularly to assist him with cleaning, laundry, and taking him to appointments.    OT Problem List: Cardiopulmonary status limiting activity;Decreased activity tolerance;Impaired balance (sitting and/or standing);Impaired vision/perception;Decreased strength   OT Treatment/Interventions: Self-care/ADL training;Therapeutic exercise;Therapeutic activities;Patient/family education;DME and/or AE instruction;Balance training      OT Goals(Current goals can be found in the care plan section)   Acute Rehab OT Goals OT Goal Formulation: With patient/family Time For Goal Achievement: 09/28/23 Potential to Achieve Goals: Good ADL Goals Pt Will Perform Grooming: with supervision;standing Pt Will Perform Lower Body Dressing: with supervision;sit to/from stand;sitting/lateral leans Pt Will Transfer to Toilet: with supervision;ambulating Pt Will Perform Toileting - Clothing Manipulation and hygiene: with supervision;sit to/from stand   OT Frequency:  Min 2X/week       AM-PAC OT "6 Clicks" Daily Activity     Outcome Measure Help from another person eating meals?: A Little Help from another person taking care of personal grooming?: A Little Help from another person toileting, which includes using toliet, bedpan, or urinal?: A Little Help from another person bathing (including washing, rinsing, drying)?: A Little Help from another person to put on and taking off regular upper body clothing?: A Little Help from another person to put on and taking off regular lower body clothing?: A Lot 6 Click Score: 17   End of Session Equipment Utilized During Treatment: Oxygen  Nurse Communication: Mobility status  Activity Tolerance: Patient limited by fatigue Patient left: in  chair;with call bell/phone within reach;with family/visitor present  OT Visit Diagnosis: Unsteadiness on feet (R26.81);Muscle weakness (generalized) (M62.81)                Time: 8119-1478 OT Time Calculation (min): 41 min Charges:  OT General Charges $OT Visit: 1 Visit OT Evaluation $OT Eval Moderate Complexity: 1 Mod    Loveda Colaizzi L Lisanne Ponce, OTR/L 09/14/2023, 3:49 PM

## 2023-09-15 DIAGNOSIS — J441 Chronic obstructive pulmonary disease with (acute) exacerbation: Secondary | ICD-10-CM | POA: Diagnosis not present

## 2023-09-15 LAB — BASIC METABOLIC PANEL WITH GFR
Anion gap: 6 (ref 5–15)
BUN: 25 mg/dL — ABNORMAL HIGH (ref 8–23)
CO2: 29 mmol/L (ref 22–32)
Calcium: 8.3 mg/dL — ABNORMAL LOW (ref 8.9–10.3)
Chloride: 102 mmol/L (ref 98–111)
Creatinine, Ser: 1.23 mg/dL (ref 0.61–1.24)
GFR, Estimated: 58 mL/min — ABNORMAL LOW (ref >60)
Glucose, Bld: 153 mg/dL — ABNORMAL HIGH (ref 70–99)
Potassium: 4.9 mmol/L (ref 3.5–5.1)
Sodium: 137 mmol/L (ref 135–145)

## 2023-09-15 MED ORDER — ALBUTEROL SULFATE (2.5 MG/3ML) 0.083% IN NEBU
2.5000 mg | INHALATION_SOLUTION | Freq: Four times a day (QID) | RESPIRATORY_TRACT | Status: DC | PRN
  Administered 2023-09-15 – 2023-09-16 (×2): 2.5 mg via RESPIRATORY_TRACT
  Filled 2023-09-15 (×2): qty 3

## 2023-09-15 MED ORDER — IPRATROPIUM-ALBUTEROL 0.5-2.5 (3) MG/3ML IN SOLN
3.0000 mL | Freq: Two times a day (BID) | RESPIRATORY_TRACT | Status: DC
  Administered 2023-09-15 – 2023-09-17 (×4): 3 mL via RESPIRATORY_TRACT
  Filled 2023-09-15 (×4): qty 3

## 2023-09-15 NOTE — Plan of Care (Signed)

## 2023-09-15 NOTE — Progress Notes (Addendum)
 PROGRESS NOTE    Mitchell [name]  WUJ:[id number] DOB: 1940/06/23 DOA: 09/13/2023 PCP: Jimmey Mould, MD   Brief Narrative:  Mitchell [name] is a 83 y.o. male who presents from home with known medical history significant of COPD on 2 L at baseline, CAD, CKD, hypertension, hyperlipidemia, legally blind, BPH and anxiety with complaints of worsening shortness of breath cough and general malaise worsening over the past 2 weeks, most notably over the past 72 hours with "intractable coughing" causing muscle soreness.  In the ED patient found to be hypoxic on baseline oxygen  even at rest - hospitalist called for admission.  Assessment & Plan:   Active Problems:   COPD exacerbation (HCC)  Acute on chronic respiratory failure due to COPD exacerbation Rule out pneumonia -Continues to require oxygen  above baseline - Continue nebs, steroids, ceftriaxone , azithromycin  - Resume home Breztri  - Cultures pending, chest x-ray without overt focal pneumonia  AKI, improving Hypokalemia - Recommend increased p.o. intake, hold IV fluids at this time, urine output remains appropriate - Replete potassium as appropriate, follow repeat labs -currently within normal limits  CAD Paroxysmal A-flutter Hypertension Hyperlipidemia - ACS ruled out, unremarkable EKG, troponin, BNP within normal limits - Continue Eliquis  - Resume home medications as appropriate (metoprolol , furosemide , statin)  Peripheral neuropathy, severe - Resume home gabapentin  Insomnia - Continue Seroquel   DVT prophylaxis:  apixaban  (ELIQUIS ) tablet 5 mg  Code Status:   Code Status: Do not attempt resuscitation (DNR) PRE-ARREST INTERVENTIONS DESIRED Family Communication: Ex-wife at bedside  Status is: Inpatient  Dispo: The patient is from: Home              Anticipated d/c is to: Home              Anticipated d/c date is: 24 to 48 hours              Patient currently not medically stable for discharge  Consultants:   None  Procedures:  None  Antimicrobials:  Azithromycin , ceftriaxone   Subjective: No acute issues or events overnight, respiratory status improving but not yet back to baseline.  Objective: Vitals:   09/14/23 1407 09/14/23 1836 09/14/23 2012 09/15/23 0507  BP: 121/62  (!) 147/68 (!) 149/75  Pulse: 63  76   Resp: 18  17 16   Temp: 97.6 F (36.4 C)  98.3 F (36.8 C) 97.6 F (36.4 C)  TempSrc: Oral  Oral Oral  SpO2: 99%  94% 98%  Weight:  103 kg    Height:  5\' 9"  (1.753 m)      Intake/Output Summary (Last 24 hours) at 09/15/2023 0803 Last data filed at 09/15/2023 0600 Gross per 24 hour  Intake 1312.33 ml  Output 900 ml  Net 412.33 ml   Filed Weights   09/14/23 1836  Weight: 103 kg    Examination:  General:  Pleasantly resting in bed, No acute distress. HEENT:  Normocephalic atraumatic.  Sclerae nonicteric, noninjected.  Extraocular movements intact bilaterally. Neck:  Without mass or deformity.  Trachea is midline. Lungs: Diminished bilaterally without overt wheeze or rales. Heart:  Regular rate and rhythm.  Without murmurs, rubs, or gallops. Abdomen:  Soft, nontender, nondistended.  Without guarding or rebound. Extremities: Profound distal neuropathy.  Data Reviewed: I have personally reviewed following labs and imaging studies  CBC: Recent Labs  Lab 09/13/23 1621 09/14/23 0021 09/14/23 1424  WBC 11.6* 8.6 10.0  NEUTROABS 8.7* 8.2*  --   HGB 14.8 14.9 15.4  HCT 48.0 47.4 50.0  MCV 93.9 91.7 93.5  PLT 159 152 173   Basic Metabolic Panel: Recent Labs  Lab 09/13/23 1820 09/14/23 0021 09/15/23 0447  NA 139 136 137  K 3.4* 3.9 4.9  CL 109 98 102  CO2 24 27 29   GLUCOSE 107* 247* 153*  BUN 16 20 25*  CREATININE 0.86 1.25* 1.23  CALCIUM  6.6* 8.1* 8.3*  MG  --  2.5*  --    GFR: Estimated Creatinine Clearance: 53.8 mL/min (by C-G formula based on SCr of 1.23 mg/dL).  Liver Function Tests: Recent Labs  Lab 09/13/23 1820  AST 16  ALT 14  ALKPHOS  52  BILITOT 0.9  PROT 4.9*  ALBUMIN 2.7*   BNP (last 3 results) Recent Labs    01/15/23 0949  PROBNP 288    Recent Results (from the past 240 hours)  Resp panel by RT-PCR (RSV, Flu A&B, Covid) Anterior Nasal Swab     Status: None   Collection Time: 09/13/23  4:21 PM   Specimen: Anterior Nasal Swab  Result Value Ref Range Status   SARS Coronavirus 2 by RT PCR NEGATIVE NEGATIVE Final    Comment: (NOTE) SARS-CoV-2 target nucleic acids are NOT DETECTED.  The SARS-CoV-2 RNA is generally detectable in upper respiratory specimens during the acute phase of infection. The lowest concentration of SARS-CoV-2 viral copies this assay can detect is 138 copies/mL. A negative result does not preclude SARS-Cov-2 infection and should not be used as the sole basis for treatment or other patient management decisions. A negative result may occur with  improper specimen collection/handling, submission of specimen other than nasopharyngeal swab, presence of viral mutation(s) within the areas targeted by this assay, and inadequate number of viral copies(<138 copies/mL). A negative result must be combined with clinical observations, patient history, and epidemiological information. The expected result is Negative.  Fact Sheet for Patients:  BloggerCourse.com  Fact Sheet for Healthcare Providers:  SeriousBroker.it  This test is no t yet approved or cleared by the [place]  FDA and  has been authorized for detection and/or diagnosis of SARS-CoV-2 by FDA under an Emergency Use Authorization (EUA). This EUA will remain  in effect (meaning this test can be used) for the duration of the COVID-19 declaration under Section 564(b)(1) of the Act, 21 U.S.C.section 360bbb-3(b)(1), unless the authorization is terminated  or revoked sooner.       Influenza A by PCR NEGATIVE NEGATIVE Final   Influenza B by PCR NEGATIVE NEGATIVE Final    Comment:  (NOTE) The Xpert Xpress SARS-CoV-2/FLU/RSV plus assay is intended as an aid in the diagnosis of influenza from Nasopharyngeal swab specimens and should not be used as a sole basis for treatment. Nasal washings and aspirates are unacceptable for Xpert Xpress SARS-CoV-2/FLU/RSV testing.  Fact Sheet for Patients: BloggerCourse.com  Fact Sheet for Healthcare Providers: SeriousBroker.it  This test is not yet approved or cleared by the [place]  FDA and has been authorized for detection and/or diagnosis of SARS-CoV-2 by FDA under an Emergency Use Authorization (EUA). This EUA will remain in effect (meaning this test can be used) for the duration of the COVID-19 declaration under Section 564(b)(1) of the Act, 21 U.S.C. section 360bbb-3(b)(1), unless the authorization is terminated or revoked.     Resp Syncytial Virus by PCR NEGATIVE NEGATIVE Final    Comment: (NOTE) Fact Sheet for Patients: BloggerCourse.com  Fact Sheet for Healthcare Providers: SeriousBroker.it  This test is not yet approved or cleared by the [place]  FDA and has been authorized  for detection and/or diagnosis of SARS-CoV-2 by FDA under an Emergency Use Authorization (EUA). This EUA will remain in effect (meaning this test can be used) for the duration of the COVID-19 declaration under Section 564(b)(1) of the Act, 21 U.S.C. section 360bbb-3(b)(1), unless the authorization is terminated or revoked.  Performed at [facility], [address] [place], [place] [address]   Culture, blood (Routine X 2) Call MD if unable to obtain prior to antibiotics being given     Status: None (Preliminary result)   Collection Time: 09/14/23 12:21 AM   Specimen: BLOOD RIGHT HAND  Result Value Ref Range Status   Specimen Description   Final    BLOOD RIGHT HAND Performed at [facility], [address] [place], [place] [address]    Special Requests   Final    BOTTLES DRAWN AEROBIC AND ANAEROBIC Blood Culture adequate volume Performed at [facility], [address] [place], [place] [address]    Culture   Final    NO GROWTH < 12 HOURS Performed at [facility], [address], [place], [place] [address]    Report Status PENDING  Incomplete  Culture, blood (Routine X 2) Call MD if unable to obtain prior to antibiotics being given     Status: None (Preliminary result)   Collection Time: 09/14/23 12:21 AM   Specimen: BLOOD RIGHT HAND  Result Value Ref Range Status   Specimen Description   Final    BLOOD RIGHT HAND Performed at [facility], [address], [place], [place] [address]    Special Requests   Final    BOTTLES DRAWN AEROBIC AND ANAEROBIC Blood Culture adequate volume Performed at [facility] [address], [place], [place] [address]    Culture   Final    NO GROWTH < 12 HOURS Performed at [facility], [address], [place], [place] [address]    Report Status PENDING  Incomplete         Radiology Studies: DG Chest Port 1 View Result Date: 09/13/2023 CLINICAL DATA:  Shortness of breath and cough. EXAM: PORTABLE CHEST 1 VIEW COMPARISON:  03/24/2022. FINDINGS: Patient is rotated to the right. The heart size and mediastinal contours are otherwise within normal limits. No focal consolidation, sizeable pleural effusion, or pneumothorax. No acute osseous abnormality. IMPRESSION: No acute cardiopulmonary findings. Electronically Signed   By: Mannie Seek M.D.   On: 09/13/2023 18:43        Scheduled Meds:  allopurinol   100 mg Oral Daily   apixaban   5 mg Oral BID   azithromycin   500 mg Oral Daily   budesonide -glycopyrrolate -formoterol   2 puff Inhalation BID   chlorpheniramine-HYDROcodone   5 mL Oral Q12H   feeding supplement  237 mL Oral BID BM   furosemide   20 mg Oral Daily   gabapentin  600 mg Oral  QHS   methylPREDNISolone  (SOLU-MEDROL ) injection  40 mg Intravenous Q12H   metoprolol  tartrate  25 mg Oral BID   multivitamin with minerals  1 tablet Oral Daily   pravastatin   20 mg Oral QPM   QUEtiapine   50 mg Oral QHS   sodium chloride  flush  3 mL Intravenous Q12H   Continuous Infusions:  cefTRIAXone  (ROCEPHIN )  IV 1 g (09/14/23 2132)     LOS: 1 day   Time spent:  Haydee Lipa, DO [facility]  If 7PM-7AM, please contact night-coverage [email or web address]  09/15/2023, 8:03 AM

## 2023-09-15 NOTE — Progress Notes (Signed)
 Mobility Specialist - Progress Note   09/15/23 0931  Oxygen  Therapy  SpO2 (!) 82 %  Patient Activity (if Appropriate) Ambulating  Mobility  Activity Ambulated with assistance in hallway  Level of Assistance Contact guard assist, steadying assist  Assistive Device Cane (x2)  Distance Ambulated (ft) 250 ft  Activity Response Tolerated well  Mobility Referral Yes  Mobility visit 1 Mobility  Mobility Specialist Start Time (ACUTE ONLY) [id number]  Mobility Specialist Stop Time (ACUTE ONLY) 0932  Mobility Specialist Time Calculation (min) (ACUTE ONLY) 27 min   Nurse requested Mobility Specialist to perform oxygen  saturation test with pt which includes removing pt from oxygen  both at rest and while ambulating.  Below are the results from that testing.     Patient Saturations on Room Air at Rest = spO2 90%  Patient Saturations on Room Air while Ambulating = sp02 82% .  Patient Saturations on 2 Liters of oxygen  while Ambulating = sp02 93% At end of testing pt left in room on 2  Liters of oxygen .  Reported results to nurse.  Pt received in recliner and agreeable to mobility. Pt tok x1 standing rest break d/t SOB. No complaints during session. Pt to recliner after session with all needs met.   [facility]

## 2023-09-15 NOTE — ED Provider Notes (Addendum)
 [facility] Provider Note   CSN: [id number] Arrival date & time: 09/13/23  1552     History  Chief Complaint  Patient presents with   Shortness of Breath   Cough    Mitchell [name] is a 83 y.o. male.  Patient has a history of COPD, coronary artery disease and legally blind.  Patient is on oxygen  at home.  Patient complains of cough and shortness of breath  The history is provided by the patient and medical records.  Shortness of Breath Severity:  Moderate Onset quality:  Sudden Timing:  Constant Progression:  Worsening Chronicity:  New Context: activity   Relieved by:  Nothing Worsened by:  Nothing Ineffective treatments:  None tried Associated symptoms: cough   Associated symptoms: no abdominal pain, no chest pain, no headaches and no rash   Cough Associated symptoms: shortness of breath   Associated symptoms: no chest pain, no eye discharge, no headaches and no rash        Home Medications Prior to Admission medications   Medication Sig Start Date End Date Taking? Authorizing Provider  albuterol  (PROVENTIL  HFA;VENTOLIN  HFA) 108 (90 BASE) MCG/ACT inhaler Inhale 2 puffs into the lungs every 4 (four) hours as needed for wheezing or shortness of breath.   Yes [provider]  albuterol  (PROVENTIL ) (2.5 MG/3ML) 0.083% nebulizer solution Take 2.5 mg by nebulization every 4 (four) hours as needed for wheezing or shortness of breath.   Yes [provider]  allopurinol  (ZYLOPRIM ) 100 MG tablet Take 100 mg by mouth in the morning. 11/07/20  Yes [provider]  apixaban  (ELIQUIS ) 5 MG TABS tablet Take 1 tablet (5 mg total) by mouth 2 (two) times daily. 03/18/23  Yes Tolia, Sunit, DO  Budeson-Glycopyrrol-Formoterol  (BREZTRI  AEROSPHERE) 160-9-4.8 MCG/ACT AERO Inhale 2 puffs into the lungs in the morning and at bedtime. Patient taking differently: Inhale 2 puffs into the lungs 2 (two) times daily as needed (for respiratory flares).  02/21/22  Yes Wilfredo Hanly, MD  finasteride  (PROSCAR ) 5 MG tablet Take 5 mg by mouth in the morning. Reported on 05/04/2015   Yes [provider]  furosemide  (LASIX ) 40 MG tablet Take 40 mg by mouth in the morning.   Yes [provider]  gabapentin (NEURONTIN) 300 MG capsule Take 600 mg by mouth at bedtime.   Yes [provider]  metoprolol  tartrate (LOPRESSOR ) 25 MG tablet TAKE 1 TABLET TWICE DAILY Patient taking differently: Take 25 mg by mouth in the morning and at bedtime. 03/22/20  Yes Arty Binning, MD  nitroGLYCERIN  (NITROSTAT ) 0.4 MG SL tablet Place 0.4 mg under the tongue every 5 (five) minutes as needed for chest pain.   Yes [provider]  OXYGEN  Inhale 2-3 L/min into the lungs continuous.   Yes [provider]  potassium chloride  (KLOR-CON ) 10 MEQ tablet Take 10 mEq by mouth in the morning.   Yes [provider]  pravastatin  (PRAVACHOL ) 20 MG tablet Take 1 tablet (20 mg total) by mouth every evening. Patient taking differently: Take 20 mg by mouth daily. 11/17/21  Yes Arty Binning, MD  TYLENOL  325 MG tablet Take 325-650 mg by mouth every 6 (six) hours as needed for mild pain (pain score 1-3) or headache.   Yes [provider]  Budeson-Glycopyrrol-Formoterol  (BREZTRI  AEROSPHERE) 160-9-4.8 MCG/ACT AERO Inhale 2 puffs into the lungs in the morning and at bedtime. Patient not taking: Reported on 09/13/2023 09/26/22   Wilfredo Hanly, MD  Budeson-Glycopyrrol-Formoterol  (BREZTRI  AEROSPHERE) 160-9-4.8 MCG/ACT AERO Inhale 2 puffs into the lungs in the morning and at bedtime. Patient not taking: Reported on 09/13/2023 11/12/22   Wilfredo Hanly, MD      Allergies    Atorvastatin , Crestor  [rosuvastatin ], and Norco [hydrocodone -acetaminophen ]    Review of Systems   Review of Systems  Constitutional:  Negative for appetite change and fatigue.  HENT:  Negative for congestion, ear discharge and sinus pressure.   Eyes:   Negative for discharge.  Respiratory:  Positive for cough and shortness of breath.   Cardiovascular:  Negative for chest pain.  Gastrointestinal:  Negative for abdominal pain and diarrhea.  Genitourinary:  Negative for frequency and hematuria.  Musculoskeletal:  Negative for back pain.  Skin:  Negative for rash.  Neurological:  Negative for seizures and headaches.  Psychiatric/Behavioral:  Negative for hallucinations.     Physical Exam Updated Vital Signs BP (!) 149/75 (BP Location: Left Arm)   Pulse 76   Temp 97.6 F (36.4 C) (Oral)   Resp 16   Ht 5\' 9"  (1.753 m)   Wt 103 kg   SpO2 (!) 82%   BMI 33.53 kg/m  Physical Exam Vitals and nursing note reviewed.  Constitutional:      Appearance: He is well-developed.  HENT:     Head: Normocephalic.     Nose: Nose normal.  Eyes:     General: No scleral icterus.    Conjunctiva/sclera: Conjunctivae normal.  Neck:     Thyroid: No thyromegaly.  Cardiovascular:     Rate and Rhythm: Normal rate and regular rhythm.     Heart sounds: No murmur heard.    No friction rub. No gallop.  Pulmonary:     Breath sounds: No stridor. Wheezing present. No rales.  Chest:     Chest wall: No tenderness.  Abdominal:     General: There is no distension.     Tenderness: There is no abdominal tenderness. There is no rebound.  Musculoskeletal:        General: Normal range of motion.     Cervical back: Neck supple.  Lymphadenopathy:     Cervical: No cervical adenopathy.  Skin:    Findings: No erythema or rash.  Neurological:     Mental Status: He is alert and oriented to person, place, and time.     Motor: No abnormal muscle tone.     Coordination: Coordination normal.  Psychiatric:        Behavior: Behavior normal.     ED Results / Procedures / Treatments   Labs (all labs ordered are listed, but only abnormal results are displayed) Labs Reviewed  CBC WITH DIFFERENTIAL/PLATELET - Abnormal; Notable for the following components:       Result Value   WBC 11.6 (*)    Neutro Abs 8.7 (*)    Monocytes Absolute 1.5 (*)    All other components within normal limits  COMPREHENSIVE METABOLIC PANEL WITH GFR - Abnormal; Notable for the following components:   Potassium 3.4 (*)    Glucose, Bld 107 (*)    Calcium  6.6 (*)    Total Protein 4.9 (*)    Albumin 2.7 (*)    All other components within normal limits  BLOOD GAS, ARTERIAL - Abnormal; Notable for the following components:   pCO2 arterial 49 (*)    pO2, Arterial 70 (*)    Bicarbonate 31.8 (*)    Acid-Base Excess 6.1 (*)    All other components within normal limits  MAGNESIUM  - Abnormal; Notable for the following components:   Magnesium  2.5 (*)    All other components within normal limits  BASIC METABOLIC PANEL WITH GFR - Abnormal; Notable for the following components:   Glucose, Bld 247 (*)    Creatinine, Ser 1.25 (*)    Calcium  8.1 (*)    GFR, Estimated 57 (*)    All other components within normal limits  CBC WITH DIFFERENTIAL/PLATELET - Abnormal; Notable for the following components:   Neutro Abs 8.2 (*)    Lymphs Abs 0.3 (*)    All other components within normal limits  BASIC METABOLIC PANEL WITH GFR - Abnormal; Notable for the following components:   Glucose, Bld 153 (*)    BUN 25 (*)    Calcium  8.3 (*)    GFR, Estimated 58 (*)    All other components within normal limits  RESP PANEL BY RT-PCR (RSV, FLU A&B, COVID)  [id number]  CULTURE, BLOOD (ROUTINE X 2)  CULTURE, BLOOD (ROUTINE X 2)  BRAIN NATRIURETIC PEPTIDE  CBC  TROPONIN I (HIGH SENSITIVITY)    EKG EKG Interpretation Date/Time:  Friday Sep 13 2023 20:32:34 EDT Ventricular Rate:  77 PR Interval:  229 QRS Duration:  89 QT Interval:  396 QTC Calculation: 449 R Axis:   64  Text Interpretation: Sinus rhythm Aberrant conduction of SV complex(es) Prolonged PR interval Confirmed by Cheyenne Cotta [phone]) on 09/13/2023 8:47:34 PM  Radiology DG Chest Port 1 View Result Date: 09/13/2023 CLINICAL DATA:   Shortness of breath and cough. EXAM: PORTABLE CHEST 1 VIEW COMPARISON:  03/24/2022. FINDINGS: Patient is rotated to the right. The heart size and mediastinal contours are otherwise within normal limits. No focal consolidation, sizeable pleural effusion, or pneumothorax. No acute osseous abnormality. IMPRESSION: No acute cardiopulmonary findings. Electronically Signed   By: Mannie Seek M.D.   On: 09/13/2023 18:43    Procedures Procedures    Medications Ordered in ED Medications  allopurinol  (ZYLOPRIM ) tablet 100 mg (100 mg Oral Given 09/15/23 0907)  furosemide  (LASIX ) tablet 20 mg (20 mg Oral Given 09/15/23 0908)  metoprolol  tartrate (LOPRESSOR ) tablet 25 mg (25 mg Oral Given 09/15/23 0908)  pravastatin  (PRAVACHOL ) tablet 20 mg (20 mg Oral Given 09/14/23 1658)  QUEtiapine  (SEROQUEL ) tablet 50 mg (50 mg Oral Given 09/14/23 2131)  apixaban  (ELIQUIS ) tablet 5 mg (5 mg Oral Given 09/15/23 0908)  cefTRIAXone  (ROCEPHIN ) 1 g in sodium chloride  0.9 % 100 mL IVPB (1 g Intravenous New Bag/Given 09/14/23 2132)  budesonide -glycopyrrolate -formoterol  (BREZTRI ) 160-9-4.8 MCG/ACT inhaler 2 puff (2 puffs Inhalation Given 09/15/23 0846)  methylPREDNISolone  sodium succinate (SOLU-MEDROL ) 40 mg/mL injection 40 mg (40 mg Intravenous Given 09/15/23 0440)  azithromycin  (ZITHROMAX ) tablet 500 mg (500 mg Oral Given 09/14/23 2130)  sodium chloride  flush (NS) 0.9 % injection 3 mL (3 mLs Intravenous Given 09/15/23 0908)  sodium chloride  flush (NS) 0.9 % injection 3 mL (has no administration in time range)  0.9 %  sodium chloride  infusion (250 mLs Intravenous New Bag/Given 09/13/23 2300)  acetaminophen  (TYLENOL ) tablet 1,000 mg (1,000 mg Oral Given 09/14/23 1014)  polyethylene glycol (MIRALAX  / GLYCOLAX ) packet 17 g (has no administration in time range)  gabapentin (NEURONTIN) capsule 600 mg (600 mg Oral Given 09/14/23 2130)  chlorpheniramine-HYDROcodone  (TUSSIONEX) 10-8 MG/5ML suspension 5 mL (5 mLs Oral Given 09/15/23 0908)  feeding  supplement (ENSURE PLUS HIGH PROTEIN) liquid 237 mL (237 mLs Oral Patient Refused/Not Given 09/15/23 0908)  multivitamin with minerals tablet 1 tablet (1 tablet Oral Given 09/15/23 0908)  ipratropium-albuterol  (DUONEB) 0.5-2.5 (3) MG/3ML nebulizer solution 3 mL (has no administration in time range)  albuterol  (PROVENTIL ) (2.5 MG/3ML) 0.083% nebulizer solution 2.5 mg (has no administration in time range)  methylPREDNISolone  sodium succinate (SOLU-MEDROL ) 125 mg/2 mL injection 125 mg (125 mg Intravenous Given 09/13/23 1640)  ipratropium-albuterol  (DUONEB) 0.5-2.5 (3) MG/3ML nebulizer solution 3 mL (3 mLs Nebulization Given 09/13/23 2050)  potassium chloride  SA (KLOR-CON  M) CR tablet 40 mEq (40 mEq Oral Given 09/14/23 0335)  chlorpheniramine-HYDROcodone  (TUSSIONEX) 10-8 MG/5ML suspension 2.5 mL (2.5 mLs Oral Given 09/14/23 1159)    ED Course/ Medical Decision Making/ A&P                                 Medical Decision Making Amount and/or Complexity of Data Reviewed Labs: ordered. Radiology: ordered.  Risk Prescription drug management. Decision regarding hospitalization.   Patient with shortness of breath and exacerbation of COPD.  He will be admitted to medicine        Final Clinical Impression(s) / ED Diagnoses Final diagnoses:  COPD exacerbation [facility])    Rx / DC Orders ED Discharge Orders     None         Cheyenne Cotta, MD 09/15/23 1133    Cheyenne Cotta, MD 09/15/23 1133

## 2023-09-15 NOTE — Progress Notes (Signed)
 1900- Received the patient, complaining of difficulty in breathing, and asking for breathing treatment, PRN albuterol  given at 1906,.  1928 -contacted the respiratory therapist to give the schedule breathing treatment at 2000 because the patient is still asking for another dose. Vital signs checked BP 148/69 Pulse -57 oxygen  saturation 95%  1950 RT came and give the schedule breathing treatment.  Charge Nurse informed about the patient complain.

## 2023-09-16 DIAGNOSIS — J441 Chronic obstructive pulmonary disease with (acute) exacerbation: Secondary | ICD-10-CM | POA: Diagnosis not present

## 2023-09-16 MED ORDER — ALBUTEROL SULFATE (2.5 MG/3ML) 0.083% IN NEBU
2.5000 mg | INHALATION_SOLUTION | RESPIRATORY_TRACT | Status: DC | PRN
  Administered 2023-09-16: 2.5 mg via RESPIRATORY_TRACT
  Filled 2023-09-16: qty 3

## 2023-09-16 NOTE — Progress Notes (Signed)
 Physical Therapy Treatment Patient Details Name: Mitchell [name] MRN: [id number] DOB: [birth year] Today's Date: 09/16/2023   History of Present Illness Mitchell [name] is a 83 yr old male brought to the hospital with shortness of breath, cough, and malaise which has been ongoing for ~2 weeks. He was found to have acute on chronic respiratory failure due to COPD exacerbation. PMH: COPD on O2, CAD, CKD, HTN, HLD, legally blind, BPH, anxiety, NSTEMI    PT Comments  PT - Cognition Comments: AxO x 3 pleasant who lives home alone.  Uses Oxygen  prior to admit.  Legally blind (can see shapes/broad colors) and HOH. Pt OOB in recliner.  Assisted with donning shoes.  General transfer comment: Pt prefers to use his 2 straight canes vs walker.  Pt prefers to wear his shoes vs footy socks.  Pt demonstarted good use B UE's to support self and asisst self in/out of recliner. General Gait Details: pt tolerated amb a greater distance 255 feet but did need x 3 standing up against wall rest break. On 3 lts oxygen  sats avg 92%. Dyspnea 1/4.  Pt self aware when he becomes dyspnea and knows when to cease activity and rest. "Feeling better", stated Pt. Pt plans to return home.  LPT has rec NO f/u services, NO equipment      If plan is discharge home, recommend the following: A little help with walking and/or transfers;A little help with bathing/dressing/bathroom;Assistance with cooking/housework;Assist for transportation;Help with stairs or ramp for entrance   Can travel by private vehicle        Equipment Recommendations  None recommended by PT    Recommendations for Other Services       Precautions / Restrictions Precautions Precautions: Fall Precaution/Restrictions Comments: Home oxygen  2 - 3 lts     Mobility  Bed Mobility               General bed mobility comments: OOB in recliner    Transfers Overall transfer level: Needs assistance Equipment used: Straight cane Transfers: Sit to/from Stand Sit  to Stand: Supervision, Contact guard assist           General transfer comment: Pt prefers to use his 2 straight canes vs walker.  Pt prefers to wear his shoes vs footy socks.  Pt demonstarted good use B UE's to support self and asisst self in/out of recliner.    Ambulation/Gait Ambulation/Gait assistance: Supervision, Contact guard assist Gait Distance (Feet): 255 Feet Assistive device: Straight cane Gait Pattern/deviations: Step-through pattern, Decreased step length - right, Decreased step length - left, Shuffle, Trunk flexed Gait velocity: decrease     General Gait Details: pt tolerated amb a greater distance 255 feet but did need x 3 standing up against wall rest break. On 3 lts oxygen  sats avg 92%. Dyspnea 1/4.  Pt self aware when he becomes dyspnea and knows when to cease activity and rest.   Stairs             Wheelchair Mobility     Tilt Bed    Modified Rankin (Stroke Patients Only)       Balance                                            Communication Communication Communication: No apparent difficulties Factors Affecting Communication: Hearing impaired  Cognition Arousal: Alert Behavior During Therapy: [facility] for tasks assessed/performed  PT - Cognitive impairments: No apparent impairments                       PT - Cognition Comments: AxO x 3 pleasant who lives home alone.  Uses Oxygen  prior to admit.  Legally blind (can see shapes/broad colors) and HOH. Following commands: Intact      Cueing Cueing Techniques: Verbal cues  Exercises      General Comments        Pertinent Vitals/Pain Pain Assessment Pain Assessment: No/denies pain    Home Living                          Prior Function            PT Goals (current goals can now be found in the care plan section)      Frequency    Min 3X/week      PT Plan      Co-evaluation              AM-PAC PT "6 Clicks" Mobility   Outcome  Measure  Help needed turning from your back to your side while in a flat bed without using bedrails?: None Help needed moving from lying on your back to sitting on the side of a flat bed without using bedrails?: None Help needed moving to and from a bed to a chair (including a wheelchair)?: A Little Help needed standing up from a chair using your arms (e.g., wheelchair or bedside chair)?: A Little Help needed to walk in hospital room?: A Little Help needed climbing 3-5 steps with a railing? : A Lot 6 Click Score: 19    End of Session Equipment Utilized During Treatment: Gait belt;Oxygen  Activity Tolerance: Patient tolerated treatment well Patient left: in chair;with call bell/phone within reach Nurse Communication: Mobility status PT Visit Diagnosis: Difficulty in walking, not elsewhere classified (R26.2)     Time: 1610-9604 PT Time Calculation (min) (ACUTE ONLY): 28 min  Charges:    $Gait Training: 8-22 mins $Therapeutic Activity: 8-22 mins PT General Charges $$ ACUTE PT VISIT: 1 Visit                     Bess Broody  PTA Acute  Rehabilitation Services Office M-F          [phone]

## 2023-09-16 NOTE — Plan of Care (Signed)

## 2023-09-16 NOTE — Plan of Care (Signed)
  Problem: Education: Goal: Knowledge of General Education information will improve Description: Including pain rating scale, medication(s)/side effects and non-pharmacologic comfort measures Outcome: Progressing   Problem: Clinical Measurements: Goal: Will remain free from infection Outcome: Progressing   Problem: Nutrition: Goal: Adequate nutrition will be maintained Outcome: Progressing   Problem: Elimination: Goal: Will not experience complications related to bowel motility Outcome: Progressing Goal: Will not experience complications related to urinary retention Outcome: Progressing   Problem: Pain Managment: Goal: General experience of comfort will improve and/or be controlled Outcome: Progressing   Problem: Safety: Goal: Ability to remain free from injury will improve Outcome: Progressing   Problem: Skin Integrity: Goal: Risk for impaired skin integrity will decrease Outcome: Progressing

## 2023-09-16 NOTE — Progress Notes (Signed)
 2120- Checked the patient, Due meds given. Requested to have cold water for him to take his meds. " States that he is not drinking warm water. Cold water given.  2210- Checked the patient and requested if he can be up in the bed, and to turn his T.V on.  0100 patient is sleeping. Door is open upon his request.

## 2023-09-16 NOTE — TOC Progression Note (Signed)
 Transition of Care [facility]) - Progression Note    Patient Details  Name: Mitchell [name] MRN: [id number] Date of Birth: 1941/01/03  Transition of Care [facility]) CM/SW Contact  Bari Leys, RN Phone Number: 09/16/2023, 3:07 PM  Clinical Narrative:  NCM outreached to [employer] , rep-[name],  per patient, not be serviced for home 02. Per [name], [id number] needs re-certification,  ambulatory 02 sats and new home 02 order entered per request from [place], [employer] will deliver portable 02 to bedside. TOC will continue to follow. Order for RW, rep-[name], accepted referral, added to AVS. TOC will continue to follow.      Expected Discharge Plan: Home/Self Care Barriers to Discharge: Continued Medical Work up  Expected Discharge Plan and Services   Discharge Planning Services: CM Consult   Living arrangements for the past 2 months: Apartment                                       Social Determinants of Health (SDOH) Interventions SDOH Screenings   Food Insecurity: No Food Insecurity (09/14/2023)  Housing: Low Risk  (09/14/2023)  Transportation Needs: No Transportation Needs (09/14/2023)  Utilities: Not At Risk (09/14/2023)  Alcohol  Screen: Low Risk  (08/24/2021)  Depression (PHQ2-9): Low Risk  (02/07/2019)  Social Connections: Socially Isolated (09/13/2023)  Tobacco Use: Medium Risk (06/12/2023)    Readmission Risk Interventions     No data to display

## 2023-09-16 NOTE — Progress Notes (Signed)
 Occupational Therapy Treatment Patient Details Name: PARNELL SPIELER MRN: [id number] DOB: 09/04/1940 Today's Date: 09/16/2023   History of present illness Mr. Gehl is a 83 yr old male brought to the hospital with shortness of breath, cough, and malaise which has been ongoing for ~2 weeks. He was found to have acute on chronic respiratory failure due to COPD exacerbation. PMH: COPD on O2, CAD, CKD, HTN, HLD, legally blind, BPH, anxiety, NSTEMI   OT comments  Pt with multiple concerns. Made referral to [facility] with pt on speaker phone. Pt educated in use of long handled shoe horn and elastic laces for the non-slip on shoes he has a home. Pt states his ex wife is struggling to load his rollator into her car, asking for a RW. He reports he has concerns about his O2 at home, case manager notified. Pt also asking for a quad cane, made aware his insurance does not typically pay for 2 walking devices. Pt educated in energy conservation. Ambulated with cane to bathroom for toileting with min assist, primarily to guide due to low vision. Pt is declining HHOT due to copay.       If plan is discharge home, recommend the following:  Assistance with cooking/housework;Assist for transportation;Direct supervision/assist for medications management;A little help with bathing/dressing/bathroom   Equipment Recommendations  Tub/shower seat    Recommendations for Other Services      Precautions / Restrictions Precautions Precautions: Fall Restrictions Weight Bearing Restrictions Per Provider Order: No       Mobility Bed Mobility               General bed mobility comments: in chair    Transfers Overall transfer level: Needs assistance Equipment used: Rolling walker (2 wheels) Transfers: Sit to/from Stand Sit to Stand: Contact guard assist           General transfer comment: increased time, heavy reliance on UEs     Balance Overall balance assessment:  Needs assistance   Sitting balance-Leahy Scale: Good     Standing balance support: Single extremity supported Standing balance-Leahy Scale: Poor                             ADL either performed or assessed with clinical judgement   ADL Overall ADL's : Needs assistance/impaired               Lower Body Bathing Details (indicate cue type and reason): recommended long handled bath sponge     Lower Body Dressing: Minimal assistance;Sitting/lateral leans Lower Body Dressing Details (indicate cue type and reason): educated in use of long handled shoe horn, pt does not wear socks at baseline, pt is unable to tie his shoes, educated in availability of elastic shoe laces Toilet Transfer: Minimal assistance;Ambulation Toilet Transfer Details (indicate cue type and reason): min assist to guide         Functional mobility during ADLs: Minimal assistance;Cane      Extremity/Trunk Assessment              Vision   Additional Comments: pt is legally blind   Perception     Praxis     Communication Communication Communication: Impaired Factors Affecting Communication: Hearing impaired   Cognition Arousal: Alert Behavior During Therapy: WFL for tasks assessed/performed Cognition: No apparent impairments  Following commands: Intact        Cueing   Cueing Techniques: Verbal cues  Exercises      Shoulder Instructions       General Comments      Pertinent Vitals/ Pain       Pain Assessment Pain Assessment: No/denies pain  Home Living                                          Prior Functioning/Environment              Frequency  Min 2X/week        Progress Toward Goals  OT Goals(current goals can now be found in the care plan section)  Progress towards OT goals: Progressing toward goals  Acute Rehab OT Goals OT Goal Formulation: With patient/family Time For Goal  Achievement: 09/28/23 Potential to Achieve Goals: Good  Plan      Co-evaluation                 AM-PAC OT "6 Clicks" Daily Activity     Outcome Measure   Help from another person eating meals?: A Little Help from another person taking care of personal grooming?: A Little Help from another person toileting, which includes using toliet, bedpan, or urinal?: A Little Help from another person bathing (including washing, rinsing, drying)?: A Lot Help from another person to put on and taking off regular upper body clothing?: A Little Help from another person to put on and taking off regular lower body clothing?: A Lot 6 Click Score: 16    End of Session Equipment Utilized During Treatment: Gait belt;Oxygen  (cane)  OT Visit Diagnosis: Unsteadiness on feet (R26.81);Muscle weakness (generalized) (M62.81);Low vision, both eyes (H54.2);Other (comment) (decreased activity tolerance)   Activity Tolerance Patient tolerated treatment well   Patient Left in chair;with call bell/phone within reach;Other (comment) (MD in room)   Nurse Communication          Time: [phone] OT Time Calculation (min): 49 min  Charges: OT General Charges $OT Visit: 1 Visit OT Treatments $Self Care/Home Management : 38-52 mins  Avanell Leigh, OTR/L Acute Rehabilitation Services Office: [phone]   Jonette Nestle 09/16/2023, 10:40 AM

## 2023-09-16 NOTE — Progress Notes (Signed)
 SATURATION QUALIFICATIONS: (This note is used to comply with regulatory documentation for home oxygen )  Patient Saturations on Room Air at Rest = 93%  Patient Saturations on Room Air while Ambulating = 82%  Patient Saturations on 3 Liters of oxygen  while Ambulating = 92%  Please briefly explain why patient needs home oxygen : Chronic COPD, previous Home Oxygen 

## 2023-09-16 NOTE — Progress Notes (Signed)
 PROGRESS NOTE    Mitchell [name]  QMV:[id number] DOB: 28-Jun-1940 DOA: 09/13/2023 PCP: Jimmey Mould, MD   Brief Narrative:  Mitchell [name] is a 83 y.o. male who presents from home with known medical history significant of COPD on 2 L at baseline, CAD, CKD, hypertension, hyperlipidemia, legally blind, BPH and anxiety with complaints of worsening shortness of breath cough and general malaise worsening over the past 2 weeks, most notably over the past 72 hours with "intractable coughing" causing muscle soreness.  In the ED patient found to be hypoxic on baseline oxygen  even at rest - hospitalist called for admission.  Assessment & Plan:   Active Problems:   COPD exacerbation (HCC)  Acute on chronic respiratory failure due to COPD exacerbation Rule out pneumonia - Continues to require oxygen  above baseline -ongoing moderate to profound hypoxia with ambulation - Continue nebs, steroids, ceftriaxone , azithromycin  - Resume home Breztri  - Patient requesting transition to DuoNeb at discharge given improved tolerance over home nebulized solution with Proventil  - Cultures pending, chest x-ray without overt focal pneumonia  AKI, resolving Hypokalemia, resolved - Recommend increased p.o. intake, hold IV fluids at this time, urine output remains appropriate - Replete potassium as appropriate, follow repeat labs -currently within normal limits  CAD Paroxysmal A-flutter Hypertension Hyperlipidemia - ACS ruled out, unremarkable EKG, troponin, BNP within normal limits - Continue Eliquis  - Resume home medications as appropriate (metoprolol , furosemide , statin)  Peripheral neuropathy, severe - Resume home gabapentin  Insomnia - Continue Seroquel   DVT prophylaxis:  apixaban  (ELIQUIS ) tablet 5 mg  Code Status:   Code Status: Do not attempt resuscitation (DNR) PRE-ARREST INTERVENTIONS DESIRED Family Communication: Ex-wife at bedside  Status is: Inpatient  Dispo: The patient is from:  Home              Anticipated d/c is to: Home              Anticipated d/c date is: 24 to 48 hours              Patient currently not medically stable for discharge given ongoing profound hypoxia and respiratory distress from baseline  Consultants:  None  Procedures:  None  Antimicrobials:  Azithromycin , ceftriaxone   Subjective: No acute issues or events overnight, respiratory status improving but not yet back to baseline.  Objective: Vitals:   09/16/23 0500 09/16/23 0611 09/16/23 0614 09/16/23 0624  BP:    (!) 144/88  Pulse:  66 65 (!) 52  Resp:  20 20 17   Temp:    (!) 97.4 F (36.3 C)  TempSrc:    Oral  SpO2:  95% 95% 97%  Weight: 103.5 kg     Height:        Intake/Output Summary (Last 24 hours) at 09/16/2023 0751 Last data filed at 09/16/2023 2841 Gross per 24 hour  Intake 1200 ml  Output 800 ml  Net 400 ml   Filed Weights   09/14/23 1836 09/16/23 0500  Weight: 103 kg 103.5 kg    Examination:  General:  Pleasantly resting in bed, No acute distress. HEENT:  Normocephalic atraumatic.  Sclerae nonicteric, noninjected.  Extraocular movements intact bilaterally. Neck:  Without mass or deformity.  Trachea is midline. Lungs: Diminished bilaterally without overt wheeze or rales. Heart:  Regular rate and rhythm.  Without murmurs, rubs, or gallops. Abdomen:  Soft, nontender, nondistended.  Without guarding or rebound. Extremities: Profound distal neuropathy.  Data Reviewed: I have personally reviewed following labs and imaging studies  CBC: Recent Labs  Lab 09/13/23 1621 09/14/23 0021 09/14/23 1424  WBC 11.6* 8.6 10.0  NEUTROABS 8.7* 8.2*  --   HGB 14.8 14.9 15.4  HCT 48.0 47.4 50.0  MCV 93.9 91.7 93.5  PLT 159 152 173   Basic Metabolic Panel: Recent Labs  Lab 09/13/23 1820 09/14/23 0021 09/15/23 0447  NA 139 136 137  K 3.4* 3.9 4.9  CL 109 98 102  CO2 24 27 29   GLUCOSE 107* 247* 153*  BUN 16 20 25*  CREATININE 0.86 1.25* 1.23  CALCIUM  6.6* 8.1*  8.3*  MG  --  2.5*  --    GFR: Estimated Creatinine Clearance: 53.9 mL/min (by C-G formula based on SCr of 1.23 mg/dL).  Liver Function Tests: Recent Labs  Lab 09/13/23 1820  AST 16  ALT 14  ALKPHOS 52  BILITOT 0.9  PROT 4.9*  ALBUMIN 2.7*   BNP (last 3 results) Recent Labs    01/15/23 0949  PROBNP 288    Recent Results (from the past 240 hours)  Resp panel by RT-PCR (RSV, Flu A&B, Covid) Anterior Nasal Swab     Status: None   Collection Time: 09/13/23  4:21 PM   Specimen: Anterior Nasal Swab  Result Value Ref Range Status   SARS Coronavirus 2 by RT PCR NEGATIVE NEGATIVE Final    Comment: (NOTE) SARS-CoV-2 target nucleic acids are NOT DETECTED.  The SARS-CoV-2 RNA is generally detectable in upper respiratory specimens during the acute phase of infection. The lowest concentration of SARS-CoV-2 viral copies this assay can detect is 138 copies/mL. A negative result does not preclude SARS-Cov-2 infection and should not be used as the sole basis for treatment or other patient management decisions. A negative result may occur with  improper specimen collection/handling, submission of specimen other than nasopharyngeal swab, presence of viral mutation(s) within the areas targeted by this assay, and inadequate number of viral copies(<138 copies/mL). A negative result must be combined with clinical observations, patient history, and epidemiological information. The expected result is Negative.  Fact Sheet for Patients:  BloggerCourse.com  Fact Sheet for Healthcare Providers:  SeriousBroker.it  This test is no t yet approved or cleared by the [place]  FDA and  has been authorized for detection and/or diagnosis of SARS-CoV-2 by FDA under an Emergency Use Authorization (EUA). This EUA will remain  in effect (meaning this test can be used) for the duration of the COVID-19 declaration under Section 564(b)(1) of the Act,  21 U.S.C.section 360bbb-3(b)(1), unless the authorization is terminated  or revoked sooner.       Influenza A by PCR NEGATIVE NEGATIVE Final   Influenza B by PCR NEGATIVE NEGATIVE Final    Comment: (NOTE) The Xpert Xpress SARS-CoV-2/FLU/RSV plus assay is intended as an aid in the diagnosis of influenza from Nasopharyngeal swab specimens and should not be used as a sole basis for treatment. Nasal washings and aspirates are unacceptable for Xpert Xpress SARS-CoV-2/FLU/RSV testing.  Fact Sheet for Patients: BloggerCourse.com  Fact Sheet for Healthcare Providers: SeriousBroker.it  This test is not yet approved or cleared by the [place]  FDA and has been authorized for detection and/or diagnosis of SARS-CoV-2 by FDA under an Emergency Use Authorization (EUA). This EUA will remain in effect (meaning this test can be used) for the duration of the COVID-19 declaration under Section 564(b)(1) of the Act, 21 U.S.C. section 360bbb-3(b)(1), unless the authorization is terminated or revoked.     Resp Syncytial Virus by PCR NEGATIVE NEGATIVE Final    Comment: (NOTE)  Fact Sheet for Patients: BloggerCourse.com  Fact Sheet for Healthcare Providers: SeriousBroker.it  This test is not yet approved or cleared by the [place]  FDA and has been authorized for detection and/or diagnosis of SARS-CoV-2 by FDA under an Emergency Use Authorization (EUA). This EUA will remain in effect (meaning this test can be used) for the duration of the COVID-19 declaration under Section 564(b)(1) of the Act, 21 U.S.C. section 360bbb-3(b)(1), unless the authorization is terminated or revoked.  Performed at [facility], [address] [place], [place] [address]   Culture, blood (Routine X 2) Call MD if unable to obtain prior to antibiotics being given     Status: None (Preliminary  result)   Collection Time: 09/14/23 12:21 AM   Specimen: BLOOD RIGHT HAND  Result Value Ref Range Status   Specimen Description   Final    BLOOD RIGHT HAND Performed at [facility], [address] [place], [place] [address]    Special Requests   Final    BOTTLES DRAWN AEROBIC AND ANAEROBIC Blood Culture adequate volume Performed at [facility], [address] [place], [place] [address]    Culture   Final    NO GROWTH 1 DAY Performed at [facility], [address], [place], [place] [address]    Report Status PENDING  Incomplete  Culture, blood (Routine X 2) Call MD if unable to obtain prior to antibiotics being given     Status: None (Preliminary result)   Collection Time: 09/14/23 12:21 AM   Specimen: BLOOD RIGHT HAND  Result Value Ref Range Status   Specimen Description   Final    BLOOD RIGHT HAND Performed at [facility], [address], [place], [place] [address]    Special Requests   Final    BOTTLES DRAWN AEROBIC AND ANAEROBIC Blood Culture adequate volume Performed at [facility], [address], [place], [place] [address]    Culture   Final    NO GROWTH 1 DAY Performed at [facility], [address] [place], [place] [address]    Report Status PENDING  Incomplete         Radiology Studies: No results found.       Scheduled Meds:  allopurinol   100 mg Oral Daily   apixaban   5 mg Oral BID   budesonide -glycopyrrolate -formoterol   2 puff Inhalation BID   chlorpheniramine-HYDROcodone   5 mL Oral Q12H   feeding supplement  237 mL Oral BID BM   furosemide   20 mg Oral Daily   gabapentin  600 mg Oral QHS   ipratropium-albuterol   3 mL Nebulization BID   methylPREDNISolone  (SOLU-MEDROL ) injection  40 mg Intravenous Q12H   metoprolol  tartrate  25 mg Oral BID   multivitamin with minerals  1 tablet Oral Daily   pravastatin   20 mg Oral QPM   QUEtiapine   50 mg Oral QHS   sodium chloride  flush  3 mL  Intravenous Q12H   Continuous Infusions:  cefTRIAXone  (ROCEPHIN )  IV 1 g (09/15/23 2118)     LOS: 2 days   Time spent:  Haydee Lipa, DO [facility]  If 7PM-7AM, please contact night-coverage [email or web address]  09/16/2023, 7:51 AM

## 2023-09-16 NOTE — Plan of Care (Signed)

## 2023-09-17 ENCOUNTER — Encounter (HOSPITAL_COMMUNITY): Payer: Self-pay | Admitting: Internal Medicine

## 2023-09-17 ENCOUNTER — Other Ambulatory Visit (HOSPITAL_COMMUNITY): Payer: Self-pay

## 2023-09-17 DIAGNOSIS — J441 Chronic obstructive pulmonary disease with (acute) exacerbation: Secondary | ICD-10-CM | POA: Diagnosis not present

## 2023-09-17 MED ORDER — ADULT MULTIVITAMIN W/MINERALS CH
1.0000 | ORAL_TABLET | Freq: Every day | ORAL | 0 refills | Status: DC
  Filled 2023-09-17: qty 30, 30d supply, fill #0

## 2023-09-17 MED ORDER — PREDNISONE 10 MG PO TABS
ORAL_TABLET | ORAL | 0 refills | Status: AC
  Filled 2023-09-17: qty 30, 8d supply, fill #0

## 2023-09-17 MED ORDER — BUDESON-GLYCOPYRROL-FORMOTEROL 160-9-4.8 MCG/ACT IN AERO
2.0000 | INHALATION_SPRAY | Freq: Two times a day (BID) | RESPIRATORY_TRACT | 0 refills | Status: DC
  Filled 2023-09-17: qty 10.7, 30d supply, fill #0

## 2023-09-17 MED ORDER — ENSURE PLUS HIGH PROTEIN PO LIQD
1.0000 | Freq: Two times a day (BID) | ORAL | 0 refills | Status: DC
  Filled 2023-09-17: qty 14220, 30d supply, fill #0

## 2023-09-17 MED ORDER — IPRATROPIUM-ALBUTEROL 0.5-2.5 (3) MG/3ML IN SOLN
3.0000 mL | RESPIRATORY_TRACT | 0 refills | Status: DC | PRN
  Filled 2023-09-17: qty 360, 20d supply, fill #0

## 2023-09-17 MED ORDER — QUETIAPINE FUMARATE 50 MG PO TABS
50.0000 mg | ORAL_TABLET | Freq: Every day | ORAL | 0 refills | Status: DC
  Filled 2023-09-17: qty 30, 30d supply, fill #0

## 2023-09-17 NOTE — Progress Notes (Signed)
 Reviewed d/c instructions with pt. All questions answered. Pt waiting on outpt pharmacy meds prior to d/c.

## 2023-09-17 NOTE — Progress Notes (Signed)
 Discharge medications delivered to patient at bedside [facility]

## 2023-09-17 NOTE — Progress Notes (Signed)
 SATURATION QUALIFICATIONS: (This note is used to comply with regulatory documentation for home oxygen )  Patient Saturations on Room Air at Rest = 95%  Patient Saturations on Room Air while Ambulating = 88%  Patient Saturations on 3 Liters of oxygen  while Ambulating = 95%  Please briefly explain why patient needs home oxygen : Pt requires home oxygen  d/t desaturations on room air during ambulation.

## 2023-09-17 NOTE — TOC Transition Note (Addendum)
 Transition of Care [facility]) - Discharge Note   Patient Details  Name: Mitchell [name] MRN: [id number] Date of Birth: 02-Jan-1941  Transition of Care [facility]) CM/SW Contact:  Bari Leys, RN Phone Number: 09/17/2023, 12:47 PM   Clinical Narrative: Met with patient and friend, Ninette Basque, at bedside. Rollator delivered to room yesterday by [employer], pt confirmed he has a RW and would like to keep the Rollator. NCM called to [name] with [employer], informed patient would like the Rollator, [name] states portable 02 to be delivered to bedside today. NCM notfiied pt and his friend Genie Key confirmed patient states he is fine to travel home without 02 and they have 02 concentrator set up at home. NCM called to [name] at [employer] to cancel 02 to room. No further TOC needs.        Final next level of care: Home w Home Health Services Barriers to Discharge: Barriers Resolved   Patient Goals and CMS Choice Patient states their goals for this hospitalization and ongoing recovery are:: home CMS Medicare.gov Compare Post Acute Care list provided to:: Patient Choice offered to / list presented to : Patient [place] ownership interest in [facility].provided to:: Patient    Discharge Placement                       Discharge Plan and Services Additional resources added to the After Visit Summary for     Discharge Planning Services: CM Consult            DME Arranged: Walker rolling with seat DME Agency: [employer]       HH Arranged: RN HH Agency: [facility]        Social Drivers of Health (SDOH) Interventions SDOH Screenings   Food Insecurity: No Food Insecurity (09/14/2023)  Housing: Low Risk  (09/14/2023)  Transportation Needs: No Transportation Needs (09/14/2023)  Utilities: Not At Risk (09/14/2023)  Alcohol  Screen: Low Risk  (08/24/2021)  Depression (PHQ2-9): Low Risk  (02/07/2019)  Social Connections: Socially Isolated (09/13/2023)  Tobacco Use:  Medium Risk (06/12/2023)     Readmission Risk Interventions    09/17/2023   12:02 PM  Readmission Risk Prevention Plan  Transportation Screening Complete  PCP or Specialist Appt within 3-5 Days Complete  HRI or Home Care Consult Complete  Social Work Consult for Recovery Care Planning/Counseling Complete  Palliative Care Screening Not Applicable  Medication Review Oceanographer) Complete

## 2023-09-17 NOTE — Progress Notes (Signed)
 Mobility Specialist - Progress Note   09/17/23 0949  Oxygen  Therapy  SpO2 (!) 88 %  O2 Device Room Air  Patient Activity (if Appropriate) Ambulating  Mobility  Activity Ambulated with assistance in hallway  Level of Assistance Contact guard assist, steadying assist  Assistive Device Cane (x2)  Distance Ambulated (ft) 500 ft  Activity Response Tolerated well  Mobility Referral Yes  Mobility visit 1 Mobility  Mobility Specialist Start Time (ACUTE ONLY) 0919  Mobility Specialist Stop Time (ACUTE ONLY) 0944  Mobility Specialist Time Calculation (min) (ACUTE ONLY) 25 min   Nurse requested Mobility Specialist to perform oxygen  saturation test with pt which includes removing pt from oxygen  both at rest and while ambulating.  Below are the results from that testing.     Patient Saturations on Room Air at Rest = spO2 95%  Patient Saturations on Room Air while Ambulating = sp02 88% .   Patient Saturations on 3 Liters of oxygen  while Ambulating = sp02 95%  At end of testing pt left in room on 3  Liters of oxygen .  Reported results to nurse.  Pt received in recliner and agreeable to mobility. T took x2 brief standing rest breaks d/t fatigue. No complaints during session. Pt to recliner after session with all needs met.   [facility]

## 2023-09-17 NOTE — Discharge Summary (Signed)
 Physician Discharge Summary  ZION TA GNF:[id number] DOB: Jul 13, 1940 DOA: 09/13/2023  PCP: Jimmey Mould, MD  Admit date: 09/13/2023 Discharge date: 09/17/2023  Admitted From: Home Disposition: Home  Recommendations for Outpatient Follow-up:  Follow up with PCP in 1-2 weeks Follow-up with pulmonology next 1 to 2 weeks as scheduled  Home Health: None Equipment/Devices: Oxygen , walker  Discharge Condition: Stable CODE STATUS: DNR Diet recommendation: Low-salt low-fat low-carb diet as tolerated  Brief/Interim Summary: Mitchell [name] is a 83 y.o. male who presents from home with known medical history significant of COPD on 2 L at baseline, CAD, CKD, hypertension, hyperlipidemia, legally blind, BPH and anxiety with complaints of worsening shortness of breath cough and general malaise worsening over the past 2 weeks, most notably over the past 72 hours with "intractable coughing" causing muscle soreness.  In the ED patient found to be hypoxic on baseline oxygen  even at rest - hospitalist called for admission.  Patient admitted as above with acute respiratory failure and hypoxia on chronic respiratory failure and hypoxia secondary to COPD exacerbation.  Unlikely pneumonia, patient improved with nebs, supportive care, steroids and antibiotics.  At this time he is approaching baseline respiratory status, otherwise stable and agreeable for discharge home.  Lengthy discussion in regards to close follow-up outpatient.  Patient has multiple complaints in regards to his outpatient care including but not limited to questionable mold in his house as well as issues obtaining oxygen  due to his current oxygen  provider not maintaining consistent billing which makes payment difficult given patient is on a fixed income.  Medication changes as below, otherwise close follow-up with PCP and pulmonology in the next few weeks as scheduled.   Discharge Diagnoses:  Active Problems:   COPD exacerbation  (HCC)  Acute on chronic respiratory failure due to COPD exacerbation Rule out pneumonia - Approaching baseline, discharged on 3 L nasal cannula - Continue nebs (patient requesting transition from Proventil  to DuoNeb), steroid taper, ceftriaxone , azithromycin  - Continue home Breztri  - Cultures pending, chest x-ray without overt focal pneumonia   AKI, resolved Hypokalemia, resolved - Follow-up with PCP for repeat labs   CAD Paroxysmal A-flutter Hypertension Hyperlipidemia - ACS ruled out, unremarkable EKG, troponin, BNP within normal limits - Continue Eliquis  - Resume home medications as appropriate (metoprolol , furosemide , statin)   Peripheral neuropathy, severe - Resume home gabapentin   Insomnia - Continue Seroquel   Discharge Instructions  Discharge Instructions     Call MD for:   Complete by: As directed    Shortness of breath, worsening wheeze   Call MD for:  extreme fatigue   Complete by: As directed    Call MD for:  persistant dizziness or light-headedness   Complete by: As directed    Diet - low sodium heart healthy   Complete by: As directed    Increase activity slowly   Complete by: As directed       Allergies as of 09/17/2023       Reactions   Atorvastatin  Other (See Comments)   Severe myalgias- "just paralyzed me; I can't take it"   Crestor  Dior.Diego ] Other (See Comments)   Myalgias    Norco [hydrocodone -acetaminophen ] Hives        Medication List     TAKE these medications    albuterol  108 (90 Base) MCG/ACT inhaler Commonly known as: VENTOLIN  HFA Inhale 2 puffs into the lungs every 4 (four) hours as needed for wheezing or shortness of breath. What changed: Another medication with the same name was  removed. Continue taking this medication, and follow the directions you see here.   allopurinol  100 MG tablet Commonly known as: ZYLOPRIM  Take 100 mg by mouth in the morning.   apixaban  5 MG Tabs tablet Commonly known as: Eliquis  Take 1  tablet (5 mg total) by mouth 2 (two) times daily.   budesonide -glycopyrrolate -formoterol  160-9-4.8 MCG/ACT Aero inhaler Commonly known as: BREZTRI  Inhale 2 puffs into the lungs 2 (two) times daily. What changed:  when to take this Another medication with the same name was removed. Continue taking this medication, and follow the directions you see here.   feeding supplement Liqd Take 237 mLs by mouth 2 (two) times daily between meals.   finasteride  5 MG tablet Commonly known as: PROSCAR  Take 5 mg by mouth in the morning. Reported on 05/04/2015   furosemide  40 MG tablet Commonly known as: LASIX  Take 40 mg by mouth in the morning.   gabapentin 300 MG capsule Commonly known as: NEURONTIN Take 600 mg by mouth at bedtime.   ipratropium-albuterol  0.5-2.5 (3) MG/3ML Soln Commonly known as: DUONEB Take 3 mLs by nebulization every 4 (four) hours as needed (shortness of breath/wheeze).   metoprolol  tartrate 25 MG tablet Commonly known as: LOPRESSOR  TAKE 1 TABLET TWICE DAILY What changed: when to take this   multivitamin with minerals Tabs tablet Take 1 tablet by mouth daily. Start taking on: September 18, 2023   nitroGLYCERIN  0.4 MG SL tablet Commonly known as: NITROSTAT  Place 0.4 mg under the tongue every 5 (five) minutes as needed for chest pain.   OXYGEN  Inhale 2-3 L/min into the lungs continuous.   potassium chloride  10 MEQ tablet Commonly known as: KLOR-CON  Take 10 mEq by mouth in the morning.   pravastatin  20 MG tablet Commonly known as: PRAVACHOL  Take 1 tablet (20 mg total) by mouth every evening. What changed: when to take this   predniSONE  10 MG tablet Commonly known as: DELTASONE  Take 4 tablets (40 mg total) by mouth daily for 5 days, THEN 3 tablets (30 mg total) daily for 5 days, THEN 2 tablets (20 mg total) daily for 5 days, THEN 1 tablet (10 mg total) daily for 5 days. Start taking on: September 17, 2023   QUEtiapine  50 MG tablet Commonly known as: SEROQUEL  Take 1  tablet (50 mg total) by mouth at bedtime.   Tylenol  325 MG tablet Generic drug: acetaminophen  Take 325-650 mg by mouth every 6 (six) hours as needed for mild pain (pain score 1-3) or headache.               Durable Medical Equipment  (From admission, onward)           Start     Ordered   09/16/23 1421  For home use only DME oxygen   Once       Question Answer Comment  Length of Need Lifetime   Mode or (Route) Nasal cannula   Liters per Minute 3   Frequency Continuous (stationary and portable oxygen  unit needed)   Oxygen  conserving device No   Oxygen  delivery system Gas      09/16/23 1421   09/16/23 1357  For home use only DME Walker rolling  Once       Question Answer Comment  Walker: With 5 Inch Wheels   Patient needs a walker to treat with the following condition Ambulatory dysfunction      09/16/23 1356            Follow-up Information  Inc, [facility] Follow up.   Why: [facility]   [facility]  Contact information: [address] [place] [address] [phone]                Allergies  Allergen Reactions   Atorvastatin  Other (See Comments)    Severe myalgias- "just paralyzed me; I can't take it"   Crestor  [Rosuvastatin ] Other (See Comments)    Myalgias    Norco [Hydrocodone -Acetaminophen ] Hives    Consultations: None  Procedures/Studies: DG Chest Port 1 View Result Date: 09/13/2023 CLINICAL DATA:  Shortness of breath and cough. EXAM: PORTABLE CHEST 1 VIEW COMPARISON:  03/24/2022. FINDINGS: Patient is rotated to the right. The heart size and mediastinal contours are otherwise within normal limits. No focal consolidation, sizeable pleural effusion, or pneumothorax. No acute osseous abnormality. IMPRESSION: No acute cardiopulmonary findings. Electronically Signed   By: Mannie Seek M.D.   On: 09/13/2023 18:43     Subjective: No acute issues or events overnight denies nausea vomiting  diarrhea constipation headache fevers chills or chest pain.  Shortness of breath ongoing but at baseline.   Discharge Exam: Vitals:   09/17/23 0706 09/17/23 0949  BP: (!) 142/81   Pulse: 75   Resp: 16   Temp: (!) 97.5 F (36.4 C)   SpO2: 95% (!) 88%   Vitals:   09/17/23 0607 09/17/23 0614 09/17/23 0706 09/17/23 0949  BP:   (!) 142/81   Pulse:   75   Resp:   16   Temp:   (!) 97.5 F (36.4 C)   TempSrc:   Oral   SpO2: 94% 94% 95% (!) 88%  Weight:      Height:        General: Pt is alert, awake, not in acute distress Cardiovascular: RRR, S1/S2 +, no rubs, no gallops Respiratory: Without overt wheezes rales or rhonchi Abdominal: Soft, NT, ND, bowel sounds + Extremities: no edema, no cyanosis    The results of significant diagnostics from this hospitalization (including imaging, microbiology, ancillary and laboratory) are listed below for reference.     Microbiology: Recent Results (from the past 240 hours)  Resp panel by RT-PCR (RSV, Flu A&B, Covid) Anterior Nasal Swab     Status: None   Collection Time: 09/13/23  4:21 PM   Specimen: Anterior Nasal Swab  Result Value Ref Range Status   SARS Coronavirus 2 by RT PCR NEGATIVE NEGATIVE Final    Comment: (NOTE) SARS-CoV-2 target nucleic acids are NOT DETECTED.  The SARS-CoV-2 RNA is generally detectable in upper respiratory specimens during the acute phase of infection. The lowest concentration of SARS-CoV-2 viral copies this assay can detect is 138 copies/mL. A negative result does not preclude SARS-Cov-2 infection and should not be used as the sole basis for treatment or other patient management decisions. A negative result may occur with  improper specimen collection/handling, submission of specimen other than nasopharyngeal swab, presence of viral mutation(s) within the areas targeted by this assay, and inadequate number of viral copies(<138 copies/mL). A negative result must be combined with clinical observations,  patient history, and epidemiological information. The expected result is Negative.  Fact Sheet for Patients:  BloggerCourse.com  Fact Sheet for Healthcare Providers:  SeriousBroker.it  This test is no t yet approved or cleared by the [place]  FDA and  has been authorized for detection and/or diagnosis of SARS-CoV-2 by FDA under an Emergency Use Authorization (EUA). This EUA will remain  in effect (meaning this test can  be used) for the duration of the COVID-19 declaration under Section 564(b)(1) of the Act, 21 U.S.C.section 360bbb-3(b)(1), unless the authorization is terminated  or revoked sooner.       Influenza A by PCR NEGATIVE NEGATIVE Final   Influenza B by PCR NEGATIVE NEGATIVE Final    Comment: (NOTE) The Xpert Xpress SARS-CoV-2/FLU/RSV plus assay is intended as an aid in the diagnosis of influenza from Nasopharyngeal swab specimens and should not be used as a sole basis for treatment. Nasal washings and aspirates are unacceptable for Xpert Xpress SARS-CoV-2/FLU/RSV testing.  Fact Sheet for Patients: BloggerCourse.com  Fact Sheet for Healthcare Providers: SeriousBroker.it  This test is not yet approved or cleared by the [place]  FDA and has been authorized for detection and/or diagnosis of SARS-CoV-2 by FDA under an Emergency Use Authorization (EUA). This EUA will remain in effect (meaning this test can be used) for the duration of the COVID-19 declaration under Section 564(b)(1) of the Act, 21 U.S.C. section 360bbb-3(b)(1), unless the authorization is terminated or revoked.     Resp Syncytial Virus by PCR NEGATIVE NEGATIVE Final    Comment: (NOTE) Fact Sheet for Patients: BloggerCourse.com  Fact Sheet for Healthcare Providers: SeriousBroker.it  This test is not yet approved or cleared by the [place] FDA and has been authorized for detection and/or diagnosis of SARS-CoV-2 by FDA under an Emergency Use Authorization (EUA). This EUA will remain in effect (meaning this test can be used) for the duration of the COVID-19 declaration under Section 564(b)(1) of the Act, 21 U.S.C. section 360bbb-3(b)(1), unless the authorization is terminated or revoked.  Performed at [facility], [address], [place], [place] [address]   Culture, blood (Routine X 2) Call MD if unable to obtain prior to antibiotics being given     Status: None (Preliminary result)   Collection Time: 09/14/23 12:21 AM   Specimen: BLOOD RIGHT HAND  Result Value Ref Range Status   Specimen Description   Final    BLOOD RIGHT HAND Performed at [facility], [address], [place], [place] [address]    Special Requests   Final    BOTTLES DRAWN AEROBIC AND ANAEROBIC Blood Culture adequate volume Performed at [facility], [address], [place], [place] [address]    Culture   Final    NO GROWTH 3 DAYS Performed at [facility], [address], [place], [place] [address]    Report Status PENDING  Incomplete  Culture, blood (Routine X 2) Call MD if unable to obtain prior to antibiotics being given     Status: None (Preliminary result)   Collection Time: 09/14/23 12:21 AM   Specimen: BLOOD RIGHT HAND  Result Value Ref Range Status   Specimen Description   Final    BLOOD RIGHT HAND Performed at [facility], [address], [place], [place] [address]    Special Requests   Final    BOTTLES DRAWN AEROBIC AND ANAEROBIC Blood Culture adequate volume Performed at [facility], [address] [place], [place] [address]    Culture   Final    NO GROWTH 3 DAYS Performed at [facility], [address], [place], [place] [address]    Report Status PENDING  Incomplete     Labs: BNP (last 3 results) Recent Labs    09/13/23 1820  BNP  85.7   Basic Metabolic Panel: Recent Labs  Lab 09/13/23 1820 09/14/23 0021 09/15/23  0447  NA 139 136 137  K 3.4* 3.9 4.9  CL 109 98 102  CO2 24 27 29   GLUCOSE 107* 247* 153*  BUN 16 20 25*  CREATININE 0.86 1.25* 1.23  CALCIUM  6.6* 8.1* 8.3*  MG  --  2.5*  --    Liver Function Tests: Recent Labs  Lab 09/13/23 1820  AST 16  ALT 14  ALKPHOS 52  BILITOT 0.9  PROT 4.9*  ALBUMIN 2.7*   No results for input(s): "LIPASE", "AMYLASE" in the last 168 hours. No results for input(s): "AMMONIA" in the last 168 hours. CBC: Recent Labs  Lab 09/13/23 1621 09/14/23 0021 09/14/23 1424  WBC 11.6* 8.6 10.0  NEUTROABS 8.7* 8.2*  --   HGB 14.8 14.9 15.4  HCT 48.0 47.4 50.0  MCV 93.9 91.7 93.5  PLT 159 152 173   Cardiac Enzymes: No results for input(s): "CKTOTAL", "CKMB", "CKMBINDEX", "TROPONINI" in the last 168 hours. BNP: Invalid input(s): "POCBNP" CBG: No results for input(s): "GLUCAP" in the last 168 hours. D-Dimer No results for input(s): "DDIMER" in the last 72 hours. Hgb A1c No results for input(s): "HGBA1C" in the last 72 hours. Lipid Profile No results for input(s): "CHOL", "HDL", "LDLCALC", "TRIG", "CHOLHDL", "LDLDIRECT" in the last 72 hours. Thyroid function studies No results for input(s): "TSH", "T4TOTAL", "T3FREE", "THYROIDAB" in the last 72 hours.  Invalid input(s): "FREET3" Anemia work up No results for input(s): "VITAMINB12", "FOLATE", "FERRITIN", "TIBC", "IRON", "RETICCTPCT" in the last 72 hours. Urinalysis    Component Value Date/Time   COLORURINE YELLOW 06/22/2019 1341   APPEARANCEUR CLEAR 06/22/2019 1341   LABSPEC 1.020 06/22/2019 1341   PHURINE 5.5 06/22/2019 1341   GLUCOSEU NEGATIVE 06/22/2019 1341   HGBUR NEGATIVE 06/22/2019 1341   BILIRUBINUR MODERATE (A) 06/22/2019 1341   KETONESUR 40 (A) 06/22/2019 1341   PROTEINUR NEGATIVE 06/22/2019 1341   UROBILINOGEN 1.0 04/29/2011 1809   NITRITE NEGATIVE 06/22/2019 1341   LEUKOCYTESUR NEGATIVE  06/22/2019 1341   Sepsis Labs Recent Labs  Lab 09/13/23 1621 09/14/23 0021 09/14/23 1424  WBC 11.6* 8.6 10.0   Microbiology Recent Results (from the past 240 hours)  Resp panel by RT-PCR (RSV, Flu A&B, Covid) Anterior Nasal Swab     Status: None   Collection Time: 09/13/23  4:21 PM   Specimen: Anterior Nasal Swab  Result Value Ref Range Status   SARS Coronavirus 2 by RT PCR NEGATIVE NEGATIVE Final    Comment: (NOTE) SARS-CoV-2 target nucleic acids are NOT DETECTED.  The SARS-CoV-2 RNA is generally detectable in upper respiratory specimens during the acute phase of infection. The lowest concentration of SARS-CoV-2 viral copies this assay can detect is 138 copies/mL. A negative result does not preclude SARS-Cov-2 infection and should not be used as the sole basis for treatment or other patient management decisions. A negative result may occur with  improper specimen collection/handling, submission of specimen other than nasopharyngeal swab, presence of viral mutation(s) within the areas targeted by this assay, and inadequate number of viral copies(<138 copies/mL). A negative result must be combined with clinical observations, patient history, and epidemiological information. The expected result is Negative.  Fact Sheet for Patients:  BloggerCourse.com  Fact Sheet for Healthcare Providers:  SeriousBroker.it  This test is no t yet approved or cleared by the [place]  FDA and  has been authorized for detection and/or diagnosis of SARS-CoV-2 by FDA under an Emergency Use Authorization (EUA). This EUA will remain  in effect (meaning this test can be used) for the  duration of the COVID-19 declaration under Section 564(b)(1) of the Act, 21 U.S.C.section 360bbb-3(b)(1), unless the authorization is terminated  or revoked sooner.       Influenza A by PCR NEGATIVE NEGATIVE Final   Influenza B by PCR NEGATIVE NEGATIVE Final     Comment: (NOTE) The Xpert Xpress SARS-CoV-2/FLU/RSV plus assay is intended as an aid in the diagnosis of influenza from Nasopharyngeal swab specimens and should not be used as a sole basis for treatment. Nasal washings and aspirates are unacceptable for Xpert Xpress SARS-CoV-2/FLU/RSV testing.  Fact Sheet for Patients: BloggerCourse.com  Fact Sheet for Healthcare Providers: SeriousBroker.it  This test is not yet approved or cleared by the [place]  FDA and has been authorized for detection and/or diagnosis of SARS-CoV-2 by FDA under an Emergency Use Authorization (EUA). This EUA will remain in effect (meaning this test can be used) for the duration of the COVID-19 declaration under Section 564(b)(1) of the Act, 21 U.S.C. section 360bbb-3(b)(1), unless the authorization is terminated or revoked.     Resp Syncytial Virus by PCR NEGATIVE NEGATIVE Final    Comment: (NOTE) Fact Sheet for Patients: BloggerCourse.com  Fact Sheet for Healthcare Providers: SeriousBroker.it  This test is not yet approved or cleared by the [place]  FDA and has been authorized for detection and/or diagnosis of SARS-CoV-2 by FDA under an Emergency Use Authorization (EUA). This EUA will remain in effect (meaning this test can be used) for the duration of the COVID-19 declaration under Section 564(b)(1) of the Act, 21 U.S.C. section 360bbb-3(b)(1), unless the authorization is terminated or revoked.  Performed at [facility], [address] [place], [place] [address]   Culture, blood (Routine X 2) Call MD if unable to obtain prior to antibiotics being given     Status: None (Preliminary result)   Collection Time: 09/14/23 12:21 AM   Specimen: BLOOD RIGHT HAND  Result Value Ref Range Status   Specimen Description   Final    BLOOD RIGHT HAND Performed at [facility],  [address], [place], [place] [address]    Special Requests   Final    BOTTLES DRAWN AEROBIC AND ANAEROBIC Blood Culture adequate volume Performed at [facility], [address], [place], [place] [address]    Culture   Final    NO GROWTH 3 DAYS Performed at [facility], [address], [place], [place] [address]    Report Status PENDING  Incomplete  Culture, blood (Routine X 2) Call MD if unable to obtain prior to antibiotics being given     Status: None (Preliminary result)   Collection Time: 09/14/23 12:21 AM   Specimen: BLOOD RIGHT HAND  Result Value Ref Range Status   Specimen Description   Final    BLOOD RIGHT HAND Performed at [facility], [address], [place], [place] [address]    Special Requests   Final    BOTTLES DRAWN AEROBIC AND ANAEROBIC Blood Culture adequate volume Performed at [facility], [address], [place], [place] [address]    Culture   Final    NO GROWTH 3 DAYS Performed at [facility], [address], [place], [place] [address]    Report Status PENDING  Incomplete     Time coordinating discharge: Over 30 minutes  SIGNED:   Haydee Lipa, DO [facility] 09/17/2023, 11:21 AM Pager   If 7PM-7AM, please contact night-coverage [email or web address]

## 2023-09-18 ENCOUNTER — Telehealth: Payer: Self-pay | Admitting: Pulmonary Disease

## 2023-09-18 NOTE — Telephone Encounter (Signed)
 Rc'd CRM for O2 Concentrator that states "PT is eligible for a 5 year )2 restart." Will fwd to Dr. Diania Fortes for signature.

## 2023-09-19 LAB — CULTURE, BLOOD (ROUTINE X 2)
Culture: NO GROWTH
Culture: NO GROWTH
Special Requests: ADEQUATE
Special Requests: ADEQUATE

## 2023-09-24 ENCOUNTER — Ambulatory Visit: Admitting: Internal Medicine

## 2023-09-24 ENCOUNTER — Ambulatory Visit: Payer: Self-pay

## 2023-09-24 ENCOUNTER — Encounter: Payer: Self-pay | Admitting: Internal Medicine

## 2023-09-24 ENCOUNTER — Ambulatory Visit

## 2023-09-24 VITALS — BP 151/79 | HR 71 | Temp 98.2°F | Resp 20 | Ht 72.0 in | Wt 225.0 lb

## 2023-09-24 DIAGNOSIS — J449 Chronic obstructive pulmonary disease, unspecified: Secondary | ICD-10-CM | POA: Diagnosis not present

## 2023-09-24 DIAGNOSIS — J441 Chronic obstructive pulmonary disease with (acute) exacerbation: Secondary | ICD-10-CM | POA: Diagnosis not present

## 2023-09-24 MED ORDER — METHYLPREDNISOLONE ACETATE 80 MG/ML IJ SUSP
120.0000 mg | Freq: Once | INTRAMUSCULAR | Status: AC
  Administered 2023-09-24: 120 mg via INTRAMUSCULAR

## 2023-09-24 MED ORDER — AMOXICILLIN-POT CLAVULANATE 875-125 MG PO TABS: 1.0000 | ORAL_TABLET | Freq: Two times a day (BID) | ORAL | 0 refills | Status: DC

## 2023-09-24 MED ORDER — IPRATROPIUM-ALBUTEROL 0.5-2.5 (3) MG/3ML IN SOLN: 3.0000 mL | RESPIRATORY_TRACT | Status: DC

## 2023-09-24 NOTE — Telephone Encounter (Signed)
 Copied from CRM [id number]. Topic: Clinical - Red Word Triage >> Sep 24, 2023  9:56 AM Margarette Shawl wrote: Red Word that prompted transfer to Nurse Triage:   Recently discharged from hospital for COPD exacerbation, discharged last Tues Severe cough No chest tightness or wheezing   Reason for Disposition  [1] MODERATE difficulty breathing (e.g., speaks in phrases, SOB even at rest, pulse 100-120) AND [2] still present when not coughing    Pulmonary patient, appointment made for today in office 1 hour from now  Answer Assessment - Initial Assessment Questions 1. ONSET: "When did the cough begin?"      1-2 weeks ago  2. SEVERITY: "How bad is the cough today?"      Moderate to severe  3. SPUTUM: "Describe the color of your sputum" (none, dry cough; clear, white, yellow, green)     Unsure  4. HEMOPTYSIS: "Are you coughing up any blood?" If so ask: "How much?" (flecks, streaks, tablespoons, etc.)     No 5. DIFFICULTY BREATHING: "Are you having difficulty breathing?" If Yes, ask: "How bad is it?" (e.g., mild, moderate, severe)    - MILD: No SOB at rest, mild SOB with walking, speaks normally in sentences, can lie down, no retractions, pulse < 100.    - MODERATE: SOB at rest, SOB with minimal exertion and prefers to sit, cannot lie down flat, speaks in phrases, mild retractions, audible wheezing, pulse 100-120.    - SEVERE: Very SOB at rest, speaks in single words, struggling to breathe, sitting hunched forward, retractions, pulse > 120      Moderate  6. FEVER: "Do you have a fever?" If Yes, ask: "What is your temperature, how was it measured, and when did it start?"     No 7. CARDIAC HISTORY: "Do you have any history of heart disease?" (e.g., heart attack, congestive heart failure)      Yes 8. LUNG HISTORY: "Do you have any history of lung disease?"  (e.g., pulmonary embolus, asthma, emphysema)     Yes 9. PE RISK FACTORS: "Do you have a history of blood clots?" (or: recent major surgery, recent  prolonged travel, bedridden)     No  Protocols used: Cough - Acute Productive-A-AH   FYI Only or Action Required?: FYI only for provider  Patient is followed in Pulmonology for COPD, last seen on 09/26/2022 by Wilfredo Hanly, MD. Called Nurse Triage reporting Cough. Symptoms began 1-2 weeks ago. Interventions attempted: Nebulizer treatments. Symptoms are: gradually worsening.  Triage Disposition: Go to ED Now (Notify PCP)  Patient/caregiver understands and will follow disposition?: Yes

## 2023-09-24 NOTE — Progress Notes (Signed)
 09/24/22- 82 yoM followed by Dr Diania Fortes, last seen 09/26/22.  -COPD, Chronic hypoxic Respiratory Failure, CAD, CKD, HTN, Hyperlipidemia, Legally Blind, BPH, Hosp fu- 5/30-09/17/23  [facility]- COPD exacerbation, Acute on Chronic Respiratory Failure with Hypoxia> increased O2 to 3L - Albuterol  hfa, Breztri , Neb Duoneb, prednisone  taper Acute- Cough, SOB.  Body weight  today- 225 lbs, arrival O2 sat on room air 93%   Ex-wife is caregiver, brought him. CXR 09/13/23 1V IMPRESSION: No acute cardiopulmonary findings. Discussed the use of AI scribe software for clinical note transcription with the patient, who gave verbal consent to proceed.  History of Present Illness   Mitchell [name] "Rozelle Corning" is an 83 year old male with chronic respiratory issues who presents  for hospital follow-up.  Here with his ex-wife who serves as care-giver but doesn't live with him. He is legally blind but could see my hand and count fingers. Hard of hearing. Lives alone. 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  He experiences difficulties with his portable oxygen  concentrator, specifically with charging it due to a small plug he cannot locate. This results in the concentrator being non-functional, affecting his mobility and ability to go out.  He was recently hospitalized and received antibiotics and prednisone . Two nights ago, he called an ambulance due to worsening symptoms and received a beneficial breathing treatment. His feet swelled and turned purple, prompting the call. He is currently on prednisone  and has a nebulizer prescribed. Care-giver reports prednisone  "makes him mean".  He has difficulty swallowing large pills, often needing to crush them and mix with applesauce, which he is currently out of. He experiences significant fluid accumulation when lying down, causing frequent awakenings due to a choking sensation.   If he gets worse ee needs to go to ER     Assessment and Plan:    Chronic respiratory failure with  hypoxia COPD exacerbation On home oxygen  therapy; Needs sighted assistance to charge his portable concentrator..  - Charge portable oxygen  concentrator. - Prescribed cortisone injection for dyspnea.  -Finish prednisone  taper - Refer to Dr. Charley Constable for follow-up. - Prescribe Augmentin, send to [facility] on [address]. - Advise taking Augmentin with water or applesauce. - CXR - Neb treatment here- DuoNeb  Prednisone  use On prednisone  post-hospitalization for respiratory management. - Continue prednisone  therapy as prescribed.  Edema Reports pedal edema with discoloration, possibly due to fluid retention or other conditions.     Dysphagia- large pills - Take with applesauce/ water -Additional eval by PCP

## 2023-09-24 NOTE — Patient Instructions (Addendum)
 Order- CXR   Dx COPD exacerbation  Order- depo 120     dx COPD exacerbation  Order- neb DuoNeb here  Script sent for augmentin antibiotic - 1 twice daily  Continue your routine meds  Next available appointment with Dr Diania Fortes for ongoing care

## 2023-09-25 DIAGNOSIS — J441 Chronic obstructive pulmonary disease with (acute) exacerbation: Secondary | ICD-10-CM | POA: Diagnosis not present

## 2023-09-25 DIAGNOSIS — I130 Hypertensive heart and chronic kidney disease with heart failure and stage 1 through stage 4 chronic kidney disease, or unspecified chronic kidney disease: Secondary | ICD-10-CM | POA: Diagnosis not present

## 2023-09-25 DIAGNOSIS — I2510 Atherosclerotic heart disease of native coronary artery without angina pectoris: Secondary | ICD-10-CM | POA: Diagnosis not present

## 2023-09-25 DIAGNOSIS — I5032 Chronic diastolic (congestive) heart failure: Secondary | ICD-10-CM | POA: Diagnosis not present

## 2023-09-25 DIAGNOSIS — H548 Legal blindness, as defined in USA: Secondary | ICD-10-CM | POA: Diagnosis not present

## 2023-09-25 DIAGNOSIS — J9621 Acute and chronic respiratory failure with hypoxia: Secondary | ICD-10-CM | POA: Diagnosis not present

## 2023-09-25 DIAGNOSIS — N183 Chronic kidney disease, stage 3 unspecified: Secondary | ICD-10-CM | POA: Diagnosis not present

## 2023-09-25 DIAGNOSIS — I480 Paroxysmal atrial fibrillation: Secondary | ICD-10-CM | POA: Diagnosis not present

## 2023-09-25 DIAGNOSIS — N400 Enlarged prostate without lower urinary tract symptoms: Secondary | ICD-10-CM | POA: Diagnosis not present

## 2023-09-27 ENCOUNTER — Ambulatory Visit: Payer: Self-pay | Admitting: Internal Medicine

## 2023-09-27 NOTE — Progress Notes (Signed)
 I called and spoke with the pt and notified of results per Dr Linder Revere. He verbalized understanding.

## 2023-10-01 DIAGNOSIS — R269 Unspecified abnormalities of gait and mobility: Secondary | ICD-10-CM | POA: Diagnosis not present

## 2023-10-01 NOTE — Telephone Encounter (Signed)
 Rc'd signed [facility]  fax from Dr. Diania Fortes. Will fax to [phone]. Once confirmation rec'd will send to scan.

## 2023-10-08 DIAGNOSIS — I5032 Chronic diastolic (congestive) heart failure: Secondary | ICD-10-CM | POA: Diagnosis not present

## 2023-10-08 DIAGNOSIS — I2510 Atherosclerotic heart disease of native coronary artery without angina pectoris: Secondary | ICD-10-CM | POA: Diagnosis not present

## 2023-10-08 DIAGNOSIS — J441 Chronic obstructive pulmonary disease with (acute) exacerbation: Secondary | ICD-10-CM | POA: Diagnosis not present

## 2023-10-08 DIAGNOSIS — I130 Hypertensive heart and chronic kidney disease with heart failure and stage 1 through stage 4 chronic kidney disease, or unspecified chronic kidney disease: Secondary | ICD-10-CM | POA: Diagnosis not present

## 2023-10-08 DIAGNOSIS — H548 Legal blindness, as defined in USA: Secondary | ICD-10-CM | POA: Diagnosis not present

## 2023-10-08 DIAGNOSIS — I480 Paroxysmal atrial fibrillation: Secondary | ICD-10-CM | POA: Diagnosis not present

## 2023-10-08 DIAGNOSIS — J9621 Acute and chronic respiratory failure with hypoxia: Secondary | ICD-10-CM | POA: Diagnosis not present

## 2023-10-08 DIAGNOSIS — N400 Enlarged prostate without lower urinary tract symptoms: Secondary | ICD-10-CM | POA: Diagnosis not present

## 2023-10-08 DIAGNOSIS — N183 Chronic kidney disease, stage 3 unspecified: Secondary | ICD-10-CM | POA: Diagnosis not present

## 2023-10-09 ENCOUNTER — Other Ambulatory Visit (HOSPITAL_BASED_OUTPATIENT_CLINIC_OR_DEPARTMENT_OTHER): Payer: Self-pay

## 2023-10-09 ENCOUNTER — Ambulatory Visit: Payer: Self-pay

## 2023-10-09 ENCOUNTER — Other Ambulatory Visit: Payer: Self-pay

## 2023-10-09 DIAGNOSIS — J441 Chronic obstructive pulmonary disease with (acute) exacerbation: Secondary | ICD-10-CM

## 2023-10-09 DIAGNOSIS — I484 Atypical atrial flutter: Secondary | ICD-10-CM

## 2023-10-09 MED ORDER — APIXABAN 5 MG PO TABS
5.0000 mg | ORAL_TABLET | Freq: Two times a day (BID) | ORAL | 1 refills | Status: AC
Start: 2023-10-09 — End: ?

## 2023-10-09 MED ORDER — AZITHROMYCIN 250 MG PO TABS: ORAL_TABLET | ORAL | 0 refills | Status: DC

## 2023-10-09 MED ORDER — PREDNISONE 20 MG PO TABS: ORAL_TABLET | ORAL | 0 refills | Status: DC

## 2023-10-09 MED ORDER — PREDNISONE 20 MG PO TABS
ORAL_TABLET | ORAL | 0 refills | Status: DC
Start: 2023-10-09 — End: 2023-10-09

## 2023-10-09 NOTE — Addendum Note (Signed)
 Addended by: TRUDY WARREN CROME on: 10/09/2023 01:29 PM   Modules accepted: Orders

## 2023-10-09 NOTE — Telephone Encounter (Signed)
 Prescription refill request for Eliquis  received. Indication:aflutter Last office visit:10/24 Scr:1.23  6/25 Age: 83 Weight:102.1  kg  Prescription refilled

## 2023-10-09 NOTE — Telephone Encounter (Signed)
 Please send in prednisone  40mg  daily x 5 days and a Zpak.  Thanks, JD  (I would have sent in scripts already but I am not able to send in electronic prescriptions from this note. This has happened before based on notes created by [facility] team).

## 2023-10-09 NOTE — Telephone Encounter (Signed)
 Rx sent to pharmacy

## 2023-10-09 NOTE — Telephone Encounter (Signed)
 FYI Only or Action Required?: Action required by provider: update on patient condition.  Patient was last seen in primary care on 09/24/23. Called Nurse Triage reporting Shortness of Breath. Symptoms began a week ago. Interventions attempted: Prescription medications: inhaler/antibiotics. Symptoms are: unchanged.  Triage Disposition: Call PCP Now  Patient/caregiver understands and will follow disposition?: No, wishes to speak with PCP  Pt. Would like prescription for persistent symptoms, see note below**                     Copied from CRM #[id number]. Topic: Clinical - Red Word Triage >> Oct 09, 2023 11:16 AM Nathanel DEL wrote: Red Word that prompted transfer to Nurse Triage: pt unable to breathe, especially at night.  He starts coughing, lasting 6-7 min, then he really cannot breathe.  Unable to eat, b/c he starts to cough then can't breathe. Saw Dr Neysa on 6/10, at first the abx calmed him down.  Spaced out coughing. Had to get out of bed at 1 am. Would like to try some medicine first, and if not better, he would like appt to come in.  (Transportation issues) Reason for Disposition  [1] Follow-up call from patient regarding patient's clinical status AND [2] information urgent  Answer Assessment - Initial Assessment Questions 1. REASON FOR CALL or QUESTION: What is your reason for calling today? or How can I best help you? or What question do you have that I can help answer?      Patient called in stating his symptoms have persisted since appointment on 6/10 in which an antibiotic was prescribed. He wants the provider to call in a different medication that he may try to assist with the symptoms of cough with yellow phlegm which is making his breathing more difficult. Patient was offered an appt. However, he has the soonest available on 7/9. He would like something sent in before the weekend.  Protocols used: PCP Call - No Triage-A-AH

## 2023-10-12 ENCOUNTER — Other Ambulatory Visit: Payer: Self-pay

## 2023-10-12 ENCOUNTER — Inpatient Hospital Stay (HOSPITAL_COMMUNITY)
Admission: EM | Admit: 2023-10-12 | Discharge: 2023-10-25 | DRG: 291 | Disposition: A | Discharge status: Home Health | Attending: Internal Medicine | Admitting: Internal Medicine

## 2023-10-12 ENCOUNTER — Emergency Department (HOSPITAL_COMMUNITY)

## 2023-10-12 DIAGNOSIS — I10 Essential (primary) hypertension: Secondary | ICD-10-CM | POA: Diagnosis not present

## 2023-10-12 DIAGNOSIS — E875 Hyperkalemia: Secondary | ICD-10-CM

## 2023-10-12 DIAGNOSIS — E785 Hyperlipidemia, unspecified: Secondary | ICD-10-CM | POA: Diagnosis not present

## 2023-10-12 DIAGNOSIS — G629 Polyneuropathy, unspecified: Secondary | ICD-10-CM | POA: Diagnosis not present

## 2023-10-12 DIAGNOSIS — I130 Hypertensive heart and chronic kidney disease with heart failure and stage 1 through stage 4 chronic kidney disease, or unspecified chronic kidney disease: Principal | ICD-10-CM | POA: Diagnosis present

## 2023-10-12 DIAGNOSIS — R0602 Shortness of breath: Secondary | ICD-10-CM | POA: Diagnosis not present

## 2023-10-12 DIAGNOSIS — R0902 Hypoxemia: Secondary | ICD-10-CM | POA: Diagnosis not present

## 2023-10-12 DIAGNOSIS — I5033 Acute on chronic diastolic (congestive) heart failure: Secondary | ICD-10-CM | POA: Diagnosis present

## 2023-10-12 DIAGNOSIS — K5900 Constipation, unspecified: Secondary | ICD-10-CM

## 2023-10-12 DIAGNOSIS — I252 Old myocardial infarction: Secondary | ICD-10-CM

## 2023-10-12 DIAGNOSIS — Z955 Presence of coronary angioplasty implant and graft: Secondary | ICD-10-CM

## 2023-10-12 DIAGNOSIS — J441 Chronic obstructive pulmonary disease with (acute) exacerbation: Principal | ICD-10-CM | POA: Diagnosis present

## 2023-10-12 DIAGNOSIS — E782 Mixed hyperlipidemia: Secondary | ICD-10-CM | POA: Diagnosis present

## 2023-10-12 DIAGNOSIS — Z87891 Personal history of nicotine dependence: Secondary | ICD-10-CM | POA: Diagnosis not present

## 2023-10-12 DIAGNOSIS — M7731 Calcaneal spur, right foot: Secondary | ICD-10-CM | POA: Diagnosis not present

## 2023-10-12 DIAGNOSIS — N400 Enlarged prostate without lower urinary tract symptoms: Secondary | ICD-10-CM | POA: Diagnosis present

## 2023-10-12 DIAGNOSIS — R509 Fever, unspecified: Secondary | ICD-10-CM | POA: Diagnosis not present

## 2023-10-12 DIAGNOSIS — R0609 Other forms of dyspnea: Secondary | ICD-10-CM | POA: Diagnosis not present

## 2023-10-12 DIAGNOSIS — Z8249 Family history of ischemic heart disease and other diseases of the circulatory system: Secondary | ICD-10-CM | POA: Diagnosis not present

## 2023-10-12 DIAGNOSIS — R0789 Other chest pain: Secondary | ICD-10-CM | POA: Diagnosis not present

## 2023-10-12 DIAGNOSIS — N1831 Chronic kidney disease, stage 3a: Secondary | ICD-10-CM | POA: Diagnosis present

## 2023-10-12 DIAGNOSIS — Z79899 Other long term (current) drug therapy: Secondary | ICD-10-CM

## 2023-10-12 DIAGNOSIS — Z7901 Long term (current) use of anticoagulants: Secondary | ICD-10-CM | POA: Diagnosis not present

## 2023-10-12 DIAGNOSIS — Z7952 Long term (current) use of systemic steroids: Secondary | ICD-10-CM | POA: Diagnosis not present

## 2023-10-12 DIAGNOSIS — Z7951 Long term (current) use of inhaled steroids: Secondary | ICD-10-CM

## 2023-10-12 DIAGNOSIS — I480 Paroxysmal atrial fibrillation: Secondary | ICD-10-CM | POA: Diagnosis present

## 2023-10-12 DIAGNOSIS — M109 Gout, unspecified: Secondary | ICD-10-CM | POA: Diagnosis present

## 2023-10-12 DIAGNOSIS — H548 Legal blindness, as defined in USA: Secondary | ICD-10-CM | POA: Diagnosis present

## 2023-10-12 DIAGNOSIS — I2510 Atherosclerotic heart disease of native coronary artery without angina pectoris: Secondary | ICD-10-CM | POA: Diagnosis present

## 2023-10-12 DIAGNOSIS — Z9981 Dependence on supplemental oxygen: Secondary | ICD-10-CM

## 2023-10-12 DIAGNOSIS — Z66 Do not resuscitate: Secondary | ICD-10-CM | POA: Diagnosis not present

## 2023-10-12 DIAGNOSIS — M79671 Pain in right foot: Secondary | ICD-10-CM | POA: Diagnosis not present

## 2023-10-12 DIAGNOSIS — J449 Chronic obstructive pulmonary disease, unspecified: Secondary | ICD-10-CM | POA: Diagnosis not present

## 2023-10-12 DIAGNOSIS — E876 Hypokalemia: Secondary | ICD-10-CM | POA: Diagnosis not present

## 2023-10-12 DIAGNOSIS — M10071 Idiopathic gout, right ankle and foot: Secondary | ICD-10-CM | POA: Diagnosis not present

## 2023-10-12 DIAGNOSIS — M19071 Primary osteoarthritis, right ankle and foot: Secondary | ICD-10-CM | POA: Diagnosis not present

## 2023-10-12 DIAGNOSIS — J9621 Acute and chronic respiratory failure with hypoxia: Principal | ICD-10-CM | POA: Diagnosis present

## 2023-10-12 DIAGNOSIS — R062 Wheezing: Secondary | ICD-10-CM | POA: Diagnosis not present

## 2023-10-12 DIAGNOSIS — G4700 Insomnia, unspecified: Secondary | ICD-10-CM | POA: Diagnosis present

## 2023-10-12 LAB — CBC WITH DIFFERENTIAL/PLATELET
Abs Immature Granulocytes: 0.16 10*3/uL — ABNORMAL HIGH (ref 0.00–0.07)
Basophils Absolute: 0 10*3/uL (ref 0.0–0.1)
Basophils Relative: 0 %
Eosinophils Absolute: 0 10*3/uL (ref 0.0–0.5)
Eosinophils Relative: 0 %
HCT: 48.5 % (ref 39.0–52.0)
Hemoglobin: 15 g/dL (ref 13.0–17.0)
Immature Granulocytes: 2 %
Lymphocytes Relative: 8 %
Lymphs Abs: 0.7 10*3/uL (ref 0.7–4.0)
MCH: 28.7 pg (ref 26.0–34.0)
MCHC: 30.9 g/dL (ref 30.0–36.0)
MCV: 92.9 fL (ref 80.0–100.0)
Monocytes Absolute: 0.4 10*3/uL (ref 0.1–1.0)
Monocytes Relative: 4 %
Neutro Abs: 7.8 10*3/uL — ABNORMAL HIGH (ref 1.7–7.7)
Neutrophils Relative %: 86 %
Platelets: 196 10*3/uL (ref 150–400)
RBC: 5.22 MIL/uL (ref 4.22–5.81)
RDW: 14.9 % (ref 11.5–15.5)
WBC: 9.1 10*3/uL (ref 4.0–10.5)
nRBC: 0 % (ref 0.0–0.2)

## 2023-10-12 LAB — COMPREHENSIVE METABOLIC PANEL WITH GFR
ALT: 19 U/L (ref 0–44)
AST: 20 U/L (ref 15–41)
Albumin: 3 g/dL — ABNORMAL LOW (ref 3.5–5.0)
Alkaline Phosphatase: 61 U/L (ref 38–126)
Anion gap: 9 (ref 5–15)
BUN: 32 mg/dL — ABNORMAL HIGH (ref 8–23)
CO2: 30 mmol/L (ref 22–32)
Calcium: 8.7 mg/dL — ABNORMAL LOW (ref 8.9–10.3)
Chloride: 98 mmol/L (ref 98–111)
Creatinine, Ser: 1.23 mg/dL (ref 0.61–1.24)
GFR, Estimated: 58 mL/min — ABNORMAL LOW (ref >60)
Glucose, Bld: 153 mg/dL — ABNORMAL HIGH (ref 70–99)
Potassium: 4.8 mmol/L (ref 3.5–5.1)
Sodium: 137 mmol/L (ref 135–145)
Total Bilirubin: 0.7 mg/dL (ref 0.0–1.2)
Total Protein: 5.5 g/dL — ABNORMAL LOW (ref 6.5–8.1)

## 2023-10-12 LAB — TROPONIN I (HIGH SENSITIVITY): Troponin I (High Sensitivity): 5 ng/L (ref <18)

## 2023-10-12 LAB — BRAIN NATRIURETIC PEPTIDE: B Natriuretic Peptide: 41.1 pg/mL (ref 0.0–100.0)

## 2023-10-12 MED ORDER — METHYLPREDNISOLONE SODIUM SUCC 125 MG IJ SOLR
125.0000 mg | Freq: Once | INTRAMUSCULAR | Status: AC
  Administered 2023-10-12: 125 mg via INTRAVENOUS
  Filled 2023-10-12: qty 2

## 2023-10-12 MED ORDER — ACETAMINOPHEN 500 MG PO TABS
1000.0000 mg | ORAL_TABLET | Freq: Once | ORAL | Status: AC
  Administered 2023-10-12: 1000 mg via ORAL
  Filled 2023-10-12: qty 2

## 2023-10-12 MED ORDER — FUROSEMIDE 10 MG/ML IJ SOLN
60.0000 mg | Freq: Once | INTRAMUSCULAR | Status: AC
  Administered 2023-10-12: 60 mg via INTRAVENOUS
  Filled 2023-10-12: qty 8

## 2023-10-12 MED ORDER — IPRATROPIUM-ALBUTEROL 0.5-2.5 (3) MG/3ML IN SOLN
9.0000 mL | Freq: Once | RESPIRATORY_TRACT | Status: DC
  Filled 2023-10-12: qty 3

## 2023-10-12 NOTE — H&P (Incomplete)
 History and Physical  Mitchell [name] FMW:[id number] DOB: [birth year] DOA: 10/12/2023  PCP: Okey Carlin Redbird, MD   Chief Complaint: Shortness of breath  HPI: Mitchell [name] is a 83 y.o. male with medical history significant for COPD on 2 LNC, CAD s/p PCI, CKD 3A, paroxysmal A-fib on Eliquis , hypertension, hyperlipidemia, legally blind, BPH and anxiety who presented to the ED for evaluation of shortness of breath.  Patient reports that since discharge from the hospital over a month ago, he is continue to gain weight daily. He reports gaining more than 20 pounds of water weight since then, stating that his weight went from 203-232 over the last few weeks.  He has had shortness of breath, dyspnea on exertion, cough and orthopnea over the last week. He endorses persistent gurgling cough with production of yellow sputum.  He also reports worsening leg swelling but denies any fevers, chills, chest pain, nausea, vomiting, abdominal pain, dysuria or hemoptysis. He denies eating foods high in salt.  Patient called his pulmonologist office last week due to shortness of breath and was placed on 5 days of prednisone  and a Z-Pak.  ED Course: Initial vitals show temp 97.5, RR 20, HR 66, BP 144/89, SpO2 98% on 3 L. Initial labs significant for creatinine 1.23, glucose 153, albumin 3.0, BNP 41, WBC 9.1, Hgb 15.0, normal troponin. EKG shows sinus rhythm. CXR shows no acute cardiopulmonary disease. Pt received Tylenol  1 g x 1, IV Lasix  60 mg x 1, IV Solu-Medrol  125 mg x 1 and DuoNeb. TRH was consulted for admission.   Review of Systems: Please see HPI for pertinent positives and negatives. A complete 10 system review of systems are otherwise negative.  Past Medical History:  Diagnosis Date   Anxiety    BPH (benign prostatic hypertrophy)    CAD (coronary artery disease) 2007   with LAD/Diag CBPTCA and kissing    CKD (chronic kidney disease), stage III (HCC)    thelbert 01/04/2017   COPD (chronic obstructive  pulmonary disease) (HCC)    maybe (02/14/2012)   Depression    GERD (gastroesophageal reflux disease)    Hyperlipidemia    Hypertension    Hypoxemia    Myocardial infarction (HCC) 01/22/2012   NSTEMI with DES to RSA (PDA)   On home oxygen  therapy    Shortness of breath    walking and laying down sometimes (02/14/2012)   Past Surgical History:  Procedure Laterality Date   CATARACT EXTRACTION W/ INTRAOCULAR LENS  IMPLANT, BILATERAL  ~ 2010   CORONARY ANGIOPLASTY WITH STENT PLACEMENT  05/11/2005   PCI OF LAD   GANGLION CYST REMOVED  1956?   right   LEFT HEART CATHETERIZATION WITH CORONARY ANGIOGRAM Bilateral 01/24/2012   Procedure: LEFT HEART CATHETERIZATION WITH CORONARY ANGIOGRAM;  Surgeon: Victory LELON Claudene DOUGLAS, MD;  Location: [facility] CATH LAB;  Service: Cardiovascular;  Laterality: Bilateral;   REFRACTIVE SURGERY  2010   left   Social History:  reports that he quit smoking about 12 years ago. His smoking use included pipe. He has never used smokeless tobacco. He reports that he does not currently use alcohol  after a past usage of about 7.0 standard drinks of alcohol  per week. He reports that he does not use drugs.  Allergies  Allergen Reactions   Atorvastatin  Other (See Comments)    Severe myalgias- just paralyzed me; I can't take it   Crestor  [Rosuvastatin ] Other (See Comments)    Myalgias    Norco [Hydrocodone -Acetaminophen ] Hives    Family  History  Problem Relation Age of Onset   Heart failure Mother    Heart failure Brother      Prior to Admission medications   Medication Sig Start Date End Date Taking? Authorizing Provider  albuterol  (PROVENTIL  HFA;VENTOLIN  HFA) 108 (90 BASE) MCG/ACT inhaler Inhale 2 puffs into the lungs every 4 (four) hours as needed for wheezing or shortness of breath.    [provider]  allopurinol  (ZYLOPRIM ) 100 MG tablet Take 100 mg by mouth in the morning. 11/07/20   [provider]  amoxicillin -clavulanate (AUGMENTIN ) 875-125  MG tablet Take 1 tablet by mouth 2 (two) times daily. 09/24/23   Neysa Rama D, MD  apixaban  (ELIQUIS ) 5 MG TABS tablet Take 1 tablet (5 mg total) by mouth 2 (two) times daily. 10/09/23   Tolia, Sunit, DO  azithromycin  (ZITHROMAX ) 250 MG tablet Take two tabs day 1 then one tab daily until finished 10/09/23   Kara Dorn NOVAK, MD  budesonide -glycopyrrolate -formoterol  (BREZTRI ) 160-9-4.8 MCG/ACT AERO inhaler Inhale 2 puffs into the lungs 2 (two) times daily. 09/17/23   Lue Elsie BROCKS, MD  feeding supplement (ENSURE PLUS HIGH PROTEIN) LIQD Take 237 mLs by mouth 2 (two) times daily between meals. 09/17/23   Lue Elsie BROCKS, MD  finasteride  (PROSCAR ) 5 MG tablet Take 5 mg by mouth in the morning. Reported on 05/04/2015    [provider]  furosemide  (LASIX ) 40 MG tablet Take 40 mg by mouth in the morning.    [provider]  gabapentin  (NEURONTIN ) 300 MG capsule Take 600 mg by mouth at bedtime.    [provider]  ipratropium-albuterol  (DUONEB) 0.5-2.5 (3) MG/3ML SOLN Take 3 mLs by nebulization every 4 (four) hours as needed (shortness of breath/wheeze). 09/17/23 10/17/23  Lue Elsie BROCKS, MD  metoprolol  tartrate (LOPRESSOR ) 25 MG tablet TAKE 1 TABLET TWICE DAILY Patient taking differently: Take 25 mg by mouth in the morning and at bedtime. 03/22/20   Claudene Victory ORN, MD  Multiple Vitamin (MULTIVITAMIN WITH MINERALS) TABS tablet Take 1 tablet by mouth daily. 09/18/23   Lue Elsie BROCKS, MD  nitroGLYCERIN  (NITROSTAT ) 0.4 MG SL tablet Place 0.4 mg under the tongue every 5 (five) minutes as needed for chest pain.    [provider]  OXYGEN  Inhale 2-3 L/min into the lungs continuous.    [provider]  potassium chloride  (KLOR-CON ) 10 MEQ tablet Take 10 mEq by mouth in the morning.    [provider]  pravastatin  (PRAVACHOL ) 20 MG tablet Take 1 tablet (20 mg total) by mouth every evening. Patient taking differently: Take 20 mg by mouth daily.  11/17/21   Claudene Victory ORN, MD  predniSONE  (DELTASONE ) 20 MG tablet Take 2 tablets with food daily x 5 days 10/09/23   Kara Dorn NOVAK, MD  QUEtiapine  (SEROQUEL ) 50 MG tablet Take 1 tablet (50 mg total) by mouth at bedtime. 09/17/23   Lue Elsie BROCKS, MD  TYLENOL  325 MG tablet Take 325-650 mg by mouth every 6 (six) hours as needed for mild pain (pain score 1-3) or headache.    [provider]    Physical Exam: BP (!) 152/80   Pulse 66   Temp (!) (P) 97.5 F (36.4 C) (Oral)   Resp 20   Ht 6' (1.829 m)   Wt 107.4 kg   SpO2 98%   BMI 32.11 kg/m  General: Obese elderly man laying in bed. No acute distress. HEENT: [place]/AT. Anicteric sclera.  CV: RRR. No murmurs, rubs, or gallops. 1+  BLE edema Pulmonary: On 3 L [place]. Lungs CTAB. Normal effort. Mild expiratory wheezing in the upper lung fields.  Distant rales at the bases. Abdominal: Soft, mildly distended. Nontender. Abdominal wall edema. Normal bowel sounds. Extremities: Palpable radial and DP pulses. Normal ROM. Skin: Warm and dry. No obvious rash or lesions. Neuro: A&Ox3. Moves all extremities. Normal sensation to light touch. No focal deficit. Psych: Normal mood and affect          Labs on Admission:  Basic Metabolic Panel: Recent Labs  Lab 10/12/23 2142  NA 137  K 4.8  CL 98  CO2 30  GLUCOSE 153*  BUN 32*  CREATININE 1.23  CALCIUM  8.7*   Liver Function Tests: Recent Labs  Lab 10/12/23 2142  AST 20  ALT 19  ALKPHOS 61  BILITOT 0.7  PROT 5.5*  ALBUMIN 3.0*   No results for input(s): LIPASE, AMYLASE in the last 168 hours. No results for input(s): AMMONIA in the last 168 hours. CBC: Recent Labs  Lab 10/12/23 2142  WBC 9.1  NEUTROABS 7.8*  HGB 15.0  HCT 48.5  MCV 92.9  PLT 196   Cardiac Enzymes: No results for input(s): CKTOTAL, CKMB, CKMBINDEX, TROPONINI in the last 168 hours. BNP (last 3 results) Recent Labs    09/13/23 1820 10/12/23 2142  BNP 85.7 41.1    ProBNP (last 3  results) Recent Labs    01/15/23 0949  PROBNP 288    CBG: No results for input(s): GLUCAP in the last 168 hours.  Radiological Exams on Admission: DG Chest Portable 1 View Result Date: 10/12/2023 CLINICAL DATA:  Shortness of breath. EXAM: PORTABLE CHEST 1 VIEW COMPARISON:  None Available. FINDINGS: The heart size and mediastinal contours are within normal limits. There is no evidence of acute infiltrate, pleural effusion or pneumothorax. Multilevel degenerative changes are seen throughout the thoracic spine. IMPRESSION: No active disease. Electronically Signed   By: Suzen Dials M.D.   On: 10/12/2023 20:57   Assessment/Plan Mitchell [name] is a 83 y.o. male with medical history significant for COPD on 2 LNC, CAD s/p PCI, CKD 3A, paroxysmal A-fib on Eliquis , hypertension, hyperlipidemia, legally blind, BPH and anxiety who presented to the ED for evaluation of shortness of breath and admitted for acute on chronic respiratory failure with hypoxia.  # Acute on chronic respiratory failure with hypoxia - Presented with increased shortness of breath and dyspnea on exertion - Found to have increase O2 from 2 L [place] at baseline to 3 L - This is secondary to COPD and CHF exacerbation - Continue supplemental O2, wean as able  # COPD exacerbation - Presented with shortness of breath, wheezing and productive cough - Pt with mild expiratory wheezing on lung auscultation - CXR with no evidence of PNA - S/p IV solumedrol, and DuoNeb in the ED - Start IV Solu-Medrol  40 mg twice daily - Start daily azithromycin  500 mg for 5 doses - Continue home bronchodilator - As needed duonebs - Supplemental O2 as needed  # Acute on chronic diastolic heart failure - Last TTE on 08/2021 shows EF 55-60%, mild LVH, mild LV hypokinesis and G1DD - Pt presented with progressive shortness of breath, lower extremity edema and weight gain - Pt with clinical signs of CHF exacerbation - Acute CHF likely 2/2  repetitive need for steroids and poor absorption of Lasix  due to gut edema - Start IV lasix  40 mg twice daily - Continue Lopressor  and potassium supplementation - Follow up repeat echocardiogram - Strict I&O,  daily weights - Maintain K+ > 4.0, Mag > 2.0 - Telemetry - Consider switching from Lasix  to torsemide at discharge  # Paroxysmal A-fib - EKG shows sinus rhythm, HR in the 60s to 70s - Continue Lopressor  and Eliquis  - Telemetry  # HTN - BP elevated with SBP in the 140s to 150s - Continue Lopressor   # CAD s/P PCI # HLD - Continue pravastatin   # Neuropathy - Continue gabapentin   # Insomnia - Continue on Seroquel   # BPH - Continue finasteride   # Generalized weakness - In the setting of hypoxia and fluid retention - PT/OT eval and treat - Continue home protein supplementation  DVT prophylaxis: Eliquis     Code Status: Limited: Do not attempt resuscitation (DNR) -DNR-LIMITED -Do Not Intubate/DNI   Consults called: None  Family Communication: No family at bedside  Severity of Illness: The appropriate patient status for this patient is INPATIENT. Inpatient status is judged to be reasonable and necessary in order to provide the required intensity of service to ensure the patient's safety. The patient's presenting symptoms, physical exam findings, and initial radiographic and laboratory data in the context of their chronic comorbidities is felt to place them at high risk for further clinical deterioration. Furthermore, it is not anticipated that the patient will be medically stable for discharge from the hospital within 2 midnights of admission.   * I certify that at the point of admission it is my clinical judgment that the patient will require inpatient hospital care spanning beyond 2 midnights from the point of admission due to high intensity of service, high risk for further deterioration and high frequency of surveillance required.*  Level of care: Telemetry   This  record has been created using Conservation officer, historic buildings. Errors have been sought and corrected, but may not always be located. Such creation errors do not reflect on the standard of care.   Lou Claretta HERO, MD 10/12/2023, 11:15 PM [facility] Pager: [phone] Isaiah 41:10   If 7PM-7AM, please contact night-coverage [email or web address] Password [email or web address]

## 2023-10-12 NOTE — ED Triage Notes (Addendum)
 Pt BIB [facility] from home, for SOB. Was dx with pneumonia 3 days ago. Pt states he can't lay down without feeling like he is drowning. Has been taking his antibiotics as prescribed. Pt on 3L [place] at home at baseline. Pt has wet cough, spitting out green sputum. Pt states he has put on 2lbs daily for the last week, that he has weighed himself daily.

## 2023-10-13 ENCOUNTER — Inpatient Hospital Stay (HOSPITAL_COMMUNITY)

## 2023-10-13 DIAGNOSIS — R0609 Other forms of dyspnea: Secondary | ICD-10-CM | POA: Diagnosis not present

## 2023-10-13 DIAGNOSIS — J9621 Acute and chronic respiratory failure with hypoxia: Secondary | ICD-10-CM | POA: Diagnosis not present

## 2023-10-13 LAB — CBC
HCT: 48.1 % (ref 39.0–52.0)
Hemoglobin: 14.8 g/dL (ref 13.0–17.0)
MCH: 28.8 pg (ref 26.0–34.0)
MCHC: 30.8 g/dL (ref 30.0–36.0)
MCV: 93.8 fL (ref 80.0–100.0)
Platelets: 190 10*3/uL (ref 150–400)
RBC: 5.13 MIL/uL (ref 4.22–5.81)
RDW: 14.9 % (ref 11.5–15.5)
WBC: 9 10*3/uL (ref 4.0–10.5)
nRBC: 0 % (ref 0.0–0.2)

## 2023-10-13 LAB — BASIC METABOLIC PANEL WITH GFR
Anion gap: 10 (ref 5–15)
BUN: 34 mg/dL — ABNORMAL HIGH (ref 8–23)
CO2: 32 mmol/L (ref 22–32)
Calcium: 8.4 mg/dL — ABNORMAL LOW (ref 8.9–10.3)
Chloride: 96 mmol/L — ABNORMAL LOW (ref 98–111)
Creatinine, Ser: 1.45 mg/dL — ABNORMAL HIGH (ref 0.61–1.24)
GFR, Estimated: 48 mL/min — ABNORMAL LOW (ref >60)
Glucose, Bld: 198 mg/dL — ABNORMAL HIGH (ref 70–99)
Potassium: 4.5 mmol/L (ref 3.5–5.1)
Sodium: 138 mmol/L (ref 135–145)

## 2023-10-13 LAB — ECHOCARDIOGRAM COMPLETE
Height: 72 in
S' Lateral: 2.7 cm
Weight: 3788.38 [oz_av]

## 2023-10-13 LAB — MAGNESIUM: Magnesium: 2.6 mg/dL — ABNORMAL HIGH (ref 1.7–2.4)

## 2023-10-13 MED ORDER — DM-GUAIFENESIN ER 30-600 MG PO TB12
1.0000 | ORAL_TABLET | Freq: Two times a day (BID) | ORAL | Status: DC
  Administered 2023-10-13 – 2023-10-15 (×6): 1 via ORAL
  Filled 2023-10-13 (×6): qty 1

## 2023-10-13 MED ORDER — IPRATROPIUM-ALBUTEROL 0.5-2.5 (3) MG/3ML IN SOLN
3.0000 mL | RESPIRATORY_TRACT | Status: DC | PRN
  Administered 2023-10-13 – 2023-10-15 (×4): 3 mL via RESPIRATORY_TRACT
  Filled 2023-10-13 (×3): qty 3

## 2023-10-13 MED ORDER — ADULT MULTIVITAMIN W/MINERALS CH
1.0000 | ORAL_TABLET | Freq: Every day | ORAL | Status: DC
  Administered 2023-10-13 – 2023-10-25 (×13): 1 via ORAL
  Filled 2023-10-13 (×13): qty 1

## 2023-10-13 MED ORDER — FINASTERIDE 5 MG PO TABS
5.0000 mg | ORAL_TABLET | Freq: Every day | ORAL | Status: DC
  Administered 2023-10-13 – 2023-10-25 (×13): 5 mg via ORAL
  Filled 2023-10-13 (×13): qty 1

## 2023-10-13 MED ORDER — PREDNISONE 20 MG PO TABS
40.0000 mg | ORAL_TABLET | Freq: Every day | ORAL | Status: AC
  Administered 2023-10-14 – 2023-10-15 (×2): 40 mg via ORAL
  Filled 2023-10-13 (×2): qty 2

## 2023-10-13 MED ORDER — AZITHROMYCIN 500 MG PO TABS
500.0000 mg | ORAL_TABLET | Freq: Every day | ORAL | Status: DC
  Administered 2023-10-13: 500 mg via ORAL
  Filled 2023-10-13: qty 1
  Filled 2023-10-13: qty 2

## 2023-10-13 MED ORDER — FUROSEMIDE 10 MG/ML IJ SOLN
40.0000 mg | Freq: Two times a day (BID) | INTRAMUSCULAR | Status: DC
  Administered 2023-10-13 – 2023-10-15 (×6): 40 mg via INTRAVENOUS
  Filled 2023-10-13 (×6): qty 4

## 2023-10-13 MED ORDER — METHYLPREDNISOLONE SODIUM SUCC 40 MG IJ SOLR
40.0000 mg | Freq: Two times a day (BID) | INTRAMUSCULAR | Status: DC
  Administered 2023-10-13: 40 mg via INTRAVENOUS
  Filled 2023-10-13: qty 1

## 2023-10-13 MED ORDER — SENNOSIDES-DOCUSATE SODIUM 8.6-50 MG PO TABS
1.0000 | ORAL_TABLET | Freq: Every evening | ORAL | Status: DC | PRN
  Administered 2023-10-14 – 2023-10-21 (×3): 1 via ORAL
  Filled 2023-10-13 (×3): qty 1

## 2023-10-13 MED ORDER — BUDESON-GLYCOPYRROL-FORMOTEROL 160-9-4.8 MCG/ACT IN AERO
2.0000 | INHALATION_SPRAY | Freq: Two times a day (BID) | RESPIRATORY_TRACT | Status: DC
  Administered 2023-10-13 – 2023-10-16 (×6): 2 via RESPIRATORY_TRACT
  Filled 2023-10-13: qty 5.9

## 2023-10-13 MED ORDER — POTASSIUM CHLORIDE CRYS ER 10 MEQ PO TBCR
10.0000 meq | EXTENDED_RELEASE_TABLET | Freq: Every day | ORAL | Status: DC
  Administered 2023-10-13 – 2023-10-15 (×3): 10 meq via ORAL
  Filled 2023-10-13 (×5): qty 1

## 2023-10-13 MED ORDER — ONDANSETRON HCL 4 MG PO TABS: 4.0000 mg | ORAL_TABLET | Freq: Four times a day (QID) | ORAL | Status: DC | PRN

## 2023-10-13 MED ORDER — MELATONIN 3 MG PO TABS
3.0000 mg | ORAL_TABLET | Freq: Once | ORAL | Status: AC
  Administered 2023-10-13: 3 mg via ORAL

## 2023-10-13 MED ORDER — ONDANSETRON HCL 4 MG/2ML IJ SOLN: 4.0000 mg | Freq: Four times a day (QID) | INTRAMUSCULAR | Status: DC | PRN

## 2023-10-13 MED ORDER — METOPROLOL TARTRATE 25 MG PO TABS
25.0000 mg | ORAL_TABLET | Freq: Two times a day (BID) | ORAL | Status: DC
  Administered 2023-10-13 – 2023-10-25 (×26): 25 mg via ORAL
  Filled 2023-10-13 (×26): qty 1

## 2023-10-13 MED ORDER — APIXABAN 5 MG PO TABS
5.0000 mg | ORAL_TABLET | Freq: Two times a day (BID) | ORAL | Status: DC
  Administered 2023-10-13 – 2023-10-25 (×25): 5 mg via ORAL
  Filled 2023-10-13 (×24): qty 1
  Filled 2023-10-13: qty 2

## 2023-10-13 MED ORDER — GABAPENTIN 300 MG PO CAPS
600.0000 mg | ORAL_CAPSULE | Freq: Every day | ORAL | Status: DC
  Administered 2023-10-13 – 2023-10-24 (×13): 600 mg via ORAL
  Filled 2023-10-13 (×13): qty 2

## 2023-10-13 MED ORDER — ENSURE PLUS HIGH PROTEIN PO LIQD
1.0000 | Freq: Two times a day (BID) | ORAL | Status: DC
  Administered 2023-10-15 – 2023-10-21 (×4): 237 mL via ORAL
  Filled 2023-10-13 (×2): qty 237

## 2023-10-13 MED ORDER — QUETIAPINE FUMARATE 25 MG PO TABS
50.0000 mg | ORAL_TABLET | Freq: Every day | ORAL | Status: DC
  Administered 2023-10-13 – 2023-10-24 (×13): 50 mg via ORAL
  Filled 2023-10-13: qty 1
  Filled 2023-10-13 (×12): qty 2

## 2023-10-13 MED ORDER — PRAVASTATIN SODIUM 20 MG PO TABS
20.0000 mg | ORAL_TABLET | Freq: Every day | ORAL | Status: DC
  Administered 2023-10-13 – 2023-10-25 (×13): 20 mg via ORAL
  Filled 2023-10-13 (×13): qty 1

## 2023-10-13 NOTE — Progress Notes (Signed)
 PT Cancellation Note  Patient Details Name: Mitchell [name] MRN: [id number] DOB: 1940/08/07   Cancelled Treatment:    Reason Eval/Treat Not Completed: Patient declined, no reason specified Pt encouraged to mobilize however he reports being connected to things (ie male urine wicking system due to lasix ).  Recommended at least starting with OOB to recliner however pt declined.  Pt reports he has not had any sleep since yesterday and wants to rest.   Tari CROME [place] 10/13/2023, 3:49 PM Tari KLEIN, DPT Physical Therapist Acute Rehabilitation Services Office: [phone]

## 2023-10-13 NOTE — ED Provider Notes (Signed)
 [place] EMERGENCY DEPARTMENT AT [facility] Provider Note  CSN: [id number] Arrival date & time: 10/12/23 1942  Chief Complaint(s) Shortness of Breath  HPI Mitchell [name] is a 83 y.o. male with PMH CAD, COPD on 2 L baseline, CKD, HTN, HLD, legal blindness, BPH who presents emergency room for evaluation of shortness of breath, orthopnea.  States that since hospital discharge he has been gaining approximately 2 pounds per day and has gained almost 15 pounds of water weight.  States he has been taking his home torsemide but symptoms have not been improving and he is not urinating very much.  States that he is unable to lie flat due to severe shortness of breath and is having persistent gurgling cough.  Denies chest pain, abdominal pain, nausea, vomiting or other systemic symptoms.   Past Medical History Past Medical History:  Diagnosis Date   Anxiety    BPH (benign prostatic hypertrophy)    CAD (coronary artery disease) 2007   with LAD/Diag CBPTCA and kissing    CKD (chronic kidney disease), stage III (HCC)    thelbert 01/04/2017   COPD (chronic obstructive pulmonary disease) (HCC)    maybe (02/14/2012)   Depression    GERD (gastroesophageal reflux disease)    Hyperlipidemia    Hypertension    Hypoxemia    Myocardial infarction (HCC) 01/22/2012   NSTEMI with DES to RSA (PDA)   On home oxygen  therapy    Shortness of breath    walking and laying down sometimes (02/14/2012)   Patient Active Problem List   Diagnosis Date Noted   COPD exacerbation (HCC) 09/13/2023   Atrial fibrillation, chronic (HCC) 03/25/2022   Acute on chronic diastolic CHF (congestive heart failure) (HCC) 08/24/2021   Muscle spasm 08/24/2021   Acute CHF (congestive heart failure) (HCC) 08/23/2021   Acute on chronic respiratory failure with hypoxia (HCC) 08/23/2021   Obesity (BMI 30-39.9) 08/23/2021   COPD with acute exacerbation (HCC) 05/04/2021   Pulmonary nodule 05/02/2021   BPH (benign  prostatic hyperplasia) 05/01/2021   Chronic congestive heart failure (HCC) 05/01/2021   Effusion, right knee 01/10/2017   Osteoarthritis of right knee 01/10/2017   Gout of right knee 01/10/2017   Dehydration    Atypical atrial flutter (HCC)    Dizziness 01/04/2017   Right shoulder pain 01/04/2017   Hypokalemia 01/04/2017   Influenza 05/10/2016   CKD (chronic kidney disease), stage III (HCC) 05/10/2016   Acute respiratory failure with hypoxia (HCC) 05/02/2016   Acute kidney injury superimposed on chronic kidney disease (HCC) 05/02/2016   Gout flare 05/02/2016   Chronic respiratory failure with hypoxia (HCC) 03/15/2015   COPD with acute exacerbation (HCC) 01/31/2015   Vision impairment 01/31/2015   Right foot pain 01/31/2015   Obesity 01/31/2015   Alcohol  use 01/31/2015   COPD (chronic obstructive pulmonary disease) (HCC) 04/30/2013   Dyspnea 02/14/2012   Dyslipidemia    History of depression    Chest pain 01/23/2012   Coronary artery disease involving native coronary artery of native heart with angina pectoris (HCC) 01/23/2012   Cholelithiasis    Shortness of breath 04/29/2011   Essential hypertension 04/29/2011   Home Medication(s) Prior to Admission medications   Medication Sig Start Date End Date Taking? Authorizing Provider  albuterol  (PROVENTIL  HFA;VENTOLIN  HFA) 108 (90 BASE) MCG/ACT inhaler Inhale 2 puffs into the lungs every 4 (four) hours as needed for wheezing or shortness of breath.    [provider]  allopurinol  (ZYLOPRIM ) 100 MG tablet Take 100  mg by mouth in the morning. 11/07/20   [provider]  amoxicillin -clavulanate (AUGMENTIN ) 875-125 MG tablet Take 1 tablet by mouth 2 (two) times daily. 09/24/23   Neysa Reggy BIRCH, MD  apixaban  (ELIQUIS ) 5 MG TABS tablet Take 1 tablet (5 mg total) by mouth 2 (two) times daily. 10/09/23   Tolia, Sunit, DO  azithromycin  (ZITHROMAX ) 250 MG tablet Take two tabs day 1 then one tab daily until finished 10/09/23    Kara Dorn NOVAK, MD  budesonide -glycopyrrolate -formoterol  (BREZTRI ) 160-9-4.8 MCG/ACT AERO inhaler Inhale 2 puffs into the lungs 2 (two) times daily. 09/17/23   Lue Elsie BROCKS, MD  feeding supplement (ENSURE PLUS HIGH PROTEIN) LIQD Take 237 mLs by mouth 2 (two) times daily between meals. 09/17/23   Lue Elsie BROCKS, MD  finasteride  (PROSCAR ) 5 MG tablet Take 5 mg by mouth in the morning. Reported on 05/04/2015    [provider]  furosemide  (LASIX ) 40 MG tablet Take 40 mg by mouth in the morning.    [provider]  gabapentin  (NEURONTIN ) 300 MG capsule Take 600 mg by mouth at bedtime.    [provider]  ipratropium-albuterol  (DUONEB) 0.5-2.5 (3) MG/3ML SOLN Take 3 mLs by nebulization every 4 (four) hours as needed (shortness of breath/wheeze). 09/17/23 10/17/23  Lue Elsie BROCKS, MD  metoprolol  tartrate (LOPRESSOR ) 25 MG tablet TAKE 1 TABLET TWICE DAILY Patient taking differently: Take 25 mg by mouth in the morning and at bedtime. 03/22/20   Claudene Victory ORN, MD  Multiple Vitamin (MULTIVITAMIN WITH MINERALS) TABS tablet Take 1 tablet by mouth daily. 09/18/23   Lue Elsie BROCKS, MD  nitroGLYCERIN  (NITROSTAT ) 0.4 MG SL tablet Place 0.4 mg under the tongue every 5 (five) minutes as needed for chest pain.    [provider]  OXYGEN  Inhale 2-3 L/min into the lungs continuous.    [provider]  potassium chloride  (KLOR-CON ) 10 MEQ tablet Take 10 mEq by mouth in the morning.    [provider]  pravastatin  (PRAVACHOL ) 20 MG tablet Take 1 tablet (20 mg total) by mouth every evening. Patient taking differently: Take 20 mg by mouth daily. 11/17/21   Claudene Victory ORN, MD  predniSONE  (DELTASONE ) 20 MG tablet Take 2 tablets with food daily x 5 days 10/09/23   Kara Dorn NOVAK, MD  QUEtiapine  (SEROQUEL ) 50 MG tablet Take 1 tablet (50 mg total) by mouth at bedtime. 09/17/23   Lue Elsie BROCKS, MD  TYLENOL  325 MG tablet Take 325-650 mg by mouth every 6  (six) hours as needed for mild pain (pain score 1-3) or headache.    [provider]                                                                                                                                    Past Surgical History Past Surgical History:  Procedure Laterality Date   CATARACT EXTRACTION W/ INTRAOCULAR LENS  IMPLANT,  BILATERAL  ~ 2010   CORONARY ANGIOPLASTY WITH STENT PLACEMENT  05/11/2005   PCI OF LAD   GANGLION CYST REMOVED  1956?   right   LEFT HEART CATHETERIZATION WITH CORONARY ANGIOGRAM Bilateral 01/24/2012   Procedure: LEFT HEART CATHETERIZATION WITH CORONARY ANGIOGRAM;  Surgeon: Victory LELON Claudene DOUGLAS, MD;  Location: [facility] CATH LAB;  Service: Cardiovascular;  Laterality: Bilateral;   REFRACTIVE SURGERY  2010   left   Family History Family History  Problem Relation Age of Onset   Heart failure Mother    Heart failure Brother     Social History Social History   Tobacco Use   Smoking status: Former    Types: Pipe    Quit date: 04/28/2011    Years since quitting: 12.4   Smokeless tobacco: Never  Vaping Use   Vaping status: Never Used  Substance Use Topics   Alcohol  use: Not Currently    Alcohol /week: 7.0 standard drinks of alcohol     Types: 7 Standard drinks or equivalent per week    Comment: 02/14/2012 weekends I have 6-7 mixed drinks     occasional   Drug use: No   Allergies Atorvastatin , Crestor  [rosuvastatin ], and Norco [hydrocodone -acetaminophen ]  Review of Systems Review of Systems  Respiratory:  Positive for cough, chest tightness, shortness of breath and wheezing.   Cardiovascular:  Positive for leg swelling.    Physical Exam Vital Signs  I have reviewed the triage vital signs BP (!) 156/80   Pulse 84   Temp 97.7 F (36.5 C) (Oral)   Resp 20   Ht 6' (1.829 m)   Wt 107.4 kg   SpO2 93%   BMI 32.11 kg/m   Physical Exam Constitutional:      General: He is not in acute distress.    Appearance: Normal appearance.  HENT:      Head: Normocephalic and atraumatic.     Nose: No congestion or rhinorrhea.   Eyes:     General:        Right eye: No discharge.        Left eye: No discharge.     Extraocular Movements: Extraocular movements intact.     Pupils: Pupils are equal, round, and reactive to light.    Cardiovascular:     Rate and Rhythm: Normal rate and regular rhythm.     Heart sounds: No murmur heard. Pulmonary:     Effort: No respiratory distress.     Breath sounds: Wheezing present. No rales.  Abdominal:     General: There is no distension.     Tenderness: There is no abdominal tenderness.   Musculoskeletal:        General: Normal range of motion.     Cervical back: Normal range of motion.     Right lower leg: Edema present.     Left lower leg: Edema present.   Skin:    General: Skin is warm and dry.   Neurological:     General: No focal deficit present.     Mental Status: He is alert.     ED Results and Treatments Labs (all labs ordered are listed, but only abnormal results are displayed) Labs Reviewed  COMPREHENSIVE METABOLIC PANEL WITH GFR - Abnormal; Notable for the following components:      Result Value   Glucose, Bld 153 (*)    BUN 32 (*)    Calcium  8.7 (*)    Total Protein 5.5 (*)    Albumin 3.0 (*)  GFR, Estimated 58 (*)    All other components within normal limits  CBC WITH DIFFERENTIAL/PLATELET - Abnormal; Notable for the following components:   Neutro Abs 7.8 (*)    Abs Immature Granulocytes 0.16 (*)    All other components within normal limits  BRAIN NATRIURETIC PEPTIDE  TROPONIN I (HIGH SENSITIVITY)                                                                                                                          Radiology DG Chest Portable 1 View Result Date: 10/12/2023 CLINICAL DATA:  Shortness of breath. EXAM: PORTABLE CHEST 1 VIEW COMPARISON:  None Available. FINDINGS: The heart size and mediastinal contours are within normal limits. There is no  evidence of acute infiltrate, pleural effusion or pneumothorax. Multilevel degenerative changes are seen throughout the thoracic spine. IMPRESSION: No active disease. Electronically Signed   By: Suzen Dials M.D.   On: 10/12/2023 20:57    Pertinent labs & imaging results that were available during my care of the patient were reviewed by me and considered in my medical decision making (see MDM for details).  Medications Ordered in ED Medications  QUEtiapine  (SEROQUEL ) tablet 50 mg (50 mg Oral Given 10/13/23 0049)  metoprolol  tartrate (LOPRESSOR ) tablet 25 mg (25 mg Oral Given 10/13/23 0050)  apixaban  (ELIQUIS ) tablet 5 mg (has no administration in time range)  gabapentin  (NEURONTIN ) capsule 600 mg (600 mg Oral Given 10/13/23 0050)  budesonide -glycopyrrolate -formoterol  (BREZTRI ) 160-9-4.8 MCG/ACT inhaler 2 puff (has no administration in time range)  ipratropium-albuterol  (DUONEB) 0.5-2.5 (3) MG/3ML nebulizer solution 3 mL (has no administration in time range)  potassium chloride  (KLOR-CON ) CR tablet 10 mEq (has no administration in time range)  multivitamin with minerals tablet 1 tablet (has no administration in time range)  feeding supplement (ENSURE PLUS HIGH PROTEIN) liquid 237 mL (has no administration in time range)  finasteride  (PROSCAR ) tablet 5 mg (has no administration in time range)  senna-docusate (Senokot-S) tablet 1 tablet (has no administration in time range)  ondansetron  (ZOFRAN ) tablet 4 mg (has no administration in time range)    Or  ondansetron  (ZOFRAN ) injection 4 mg (has no administration in time range)  furosemide  (LASIX ) injection 60 mg (60 mg Intravenous Given 10/12/23 2143)  methylPREDNISolone  sodium succinate (SOLU-MEDROL ) 125 mg/2 mL injection 125 mg (125 mg Intravenous Given 10/12/23 2143)  acetaminophen  (TYLENOL ) tablet 1,000 mg (1,000 mg Oral Given 10/12/23 2142)  Procedures .Critical Care  Performed by: Albertina Dixon, MD Authorized by: Albertina Dixon, MD   Critical care provider statement:    Critical care time (minutes):  30   Critical care was necessary to treat or prevent imminent or life-threatening deterioration of the following conditions:  Respiratory failure   Critical care was time spent personally by me on the following activities:  Development of treatment plan with patient or surrogate, discussions with consultants, evaluation of patient's response to treatment, examination of patient, ordering and review of laboratory studies, ordering and review of radiographic studies, ordering and performing treatments and interventions, pulse oximetry, re-evaluation of patient's condition and review of old charts   (including critical care time)  Medical Decision Making / ED Course   This patient presents to the ED for concern of shortness of breath, this involves an extensive number of treatment options, and is a complaint that carries with it a high risk of complications and morbidity.  The differential diagnosis includes Pe, PTX, Pulmonary Edema, COPD/Asthma, ACS, CHF exacerbation, Arrhythmia,  Anemia, Sepsis, Anxiety, Viral UR  MDM: Patient seen emergency room for evaluation of shortness of breath.  Physical exam reveals some tachypnea with wheezing bilaterally, mild lower extremity edema.  Patient requiring 4 L nasal cannula to maintain oxygen  saturations which is an increase from patient's baseline.  Patient given 3 DuoNebs with methylprednisolone  and on reevaluation wheezing is improving.  Laboratory evaluation is largely unremarkable including a normal BNP and high-sensitivity troponin.  Chest x-ray unremarkable.  Patient will require hospital admission for failure of outpatient diuretic regimen, new oxygen  requirement and likely mixed COPD/CHF exacerbation picture.  Patient admitted   Additional history  obtained: -Additional history obtained from son -External records from outside source obtained and reviewed including: Chart review including previous notes, labs, imaging, consultation notes   Lab Tests: -I ordered, reviewed, and interpreted labs.   The pertinent results include:   Labs Reviewed  COMPREHENSIVE METABOLIC PANEL WITH GFR - Abnormal; Notable for the following components:      Result Value   Glucose, Bld 153 (*)    BUN 32 (*)    Calcium  8.7 (*)    Total Protein 5.5 (*)    Albumin 3.0 (*)    GFR, Estimated 58 (*)    All other components within normal limits  CBC WITH DIFFERENTIAL/PLATELET - Abnormal; Notable for the following components:   Neutro Abs 7.8 (*)    Abs Immature Granulocytes 0.16 (*)    All other components within normal limits  BRAIN NATRIURETIC PEPTIDE  TROPONIN I (HIGH SENSITIVITY)      EKG   EKG Interpretation Date/Time:  Saturday October 12 2023 19:53:47 EDT Ventricular Rate:  74 PR Interval:  209 QRS Duration:  84 QT Interval:  379 QTC Calculation: 421 R Axis:   58  Text Interpretation: Sinus rhythm Confirmed by Viera Okonski ([id number]) on 10/13/2023 1:54:31 AM         Imaging Studies ordered: I ordered imaging studies including chest x-ray I independently visualized and interpreted imaging. I agree with the radiologist interpretation   Medicines ordered and prescription drug management: Meds ordered this encounter  Medications   furosemide  (LASIX ) injection 60 mg   DISCONTD: ipratropium-albuterol  (DUONEB) 0.5-2.5 (3) MG/3ML nebulizer solution 9 mL   methylPREDNISolone  sodium succinate (SOLU-MEDROL ) 125 mg/2 mL injection 125 mg   acetaminophen  (TYLENOL ) tablet 1,000 mg   QUEtiapine  (SEROQUEL ) tablet 50 mg   metoprolol  tartrate (LOPRESSOR ) tablet 25 mg   apixaban  (ELIQUIS ) tablet 5 mg  gabapentin  (NEURONTIN ) capsule 600 mg   budesonide -glycopyrrolate -formoterol  (BREZTRI ) 160-9-4.8 MCG/ACT inhaler 2 puff   ipratropium-albuterol   (DUONEB) 0.5-2.5 (3) MG/3ML nebulizer solution 3 mL   potassium chloride  (KLOR-CON ) CR tablet 10 mEq   multivitamin with minerals tablet 1 tablet   feeding supplement (ENSURE PLUS HIGH PROTEIN) liquid 237 mL   finasteride  (PROSCAR ) tablet 5 mg    Reported on 05/04/2015     senna-docusate (Senokot-S) tablet 1 tablet   OR Linked Order Group    ondansetron  (ZOFRAN ) tablet 4 mg    ondansetron  (ZOFRAN ) injection 4 mg    -I have reviewed the patients home medicines and have made adjustments as needed  Critical interventions Multiple DuoNebs, steroids, oxygen  supplementation    Cardiac Monitoring: The patient was maintained on a cardiac monitor.  I personally viewed and interpreted the cardiac monitored which showed an underlying rhythm of: NSR  Social Determinants of Health:  Factors impacting patients care include: none   Reevaluation: After the interventions noted above, I reevaluated the patient and found that they have :improved  Co morbidities that complicate the patient evaluation  Past Medical History:  Diagnosis Date   Anxiety    BPH (benign prostatic hypertrophy)    CAD (coronary artery disease) 2007   with LAD/Diag CBPTCA and kissing    CKD (chronic kidney disease), stage III (HCC)    thelbert 01/04/2017   COPD (chronic obstructive pulmonary disease) (HCC)    maybe (02/14/2012)   Depression    GERD (gastroesophageal reflux disease)    Hyperlipidemia    Hypertension    Hypoxemia    Myocardial infarction (HCC) 01/22/2012   NSTEMI with DES to RSA (PDA)   On home oxygen  therapy    Shortness of breath    walking and laying down sometimes (02/14/2012)      Dispostion: I considered admission for this patient, and patient will require hospital admission for COPD/CHF exacerbation with new oxygen  requirement     Final Clinical Impression(s) / ED Diagnoses Final diagnoses:  COPD exacerbation (HCC)  Hypoxia     @PCDICTATION @    Albertina Dixon,  MD 10/13/23 0155

## 2023-10-13 NOTE — Progress Notes (Signed)
 TOC consulted for Heart Failure Home Health Screen; orders for Heart Failure Navigation Team previously placed; will clear referral.

## 2023-10-13 NOTE — Progress Notes (Signed)
*  PRELIMINARY RESULTS* Echocardiogram 2D Echocardiogram has been performed.  Mitchell [name] 10/13/2023, 11:39 AM

## 2023-10-13 NOTE — Progress Notes (Signed)
 PROGRESS NOTE  Mitchell [name] FMW:[id number] DOB: 1941/03/21 DOA: 10/12/2023 PCP: Okey Carlin Redbird, MD   LOS: 1 day   Brief Narrative / Interim history: 83 year old male with history of COPD on 2 L at home, PAF on Eliquis , chronic hypoxia with 2 L at home, CAD status post PCI, CKD 3A, legally blind, comes into the hospital with shortness of breath and fluid retention.  He tells me that ever since she was discharged a month ago, continue to gain weight, and is up at least 20 pounds of water since then.  He has had pretty significant dyspnea on exertion, and worsening coughing.  He feels that his legs have become more swollen.  No nausea or vomiting.  He just finished prednisone  and a Z-Pak last week after calling his pulmonologist.  Subjective / 24h Interval events: Reports persistent shortness of breath on my evaluation this morning  Assesement and Plan: Principal Problem:   Acute on chronic respiratory failure with hypoxia (HCC) Active Problems:   Hypoxia   Principal problem Acute on chronic diastolic CHF-most recent 2D echo in 2023 showed LVEF 55-60%, LVH, grade 1 diastolic dysfunction.  RV was normal.  There was mild hypokinesis of the left ventricular, basal inferior walls.-He is here with fluid overload, has lower extremity edema, has been started on furosemide , continue.  Monitor ins and outs, daily weights - Repeat 2D echo pending  Active problems Acute on chronic hypoxic respiratory failure-at home he is on 2 L, has slightly higher oxygen  needs now at 3 L.  Has had wheezing on admission and was started on Solu-Medrol , nebulizers and a Z-Pak for 5 doses.  Possible COPD exacerbation -no further wheezing this morning, transition steroids to prednisone  for couple additional days.  I think his wheezing may have been in the setting of #1 rather than true COPD exacerbation  PAF-in sinus, continue metoprolol , Eliquis   Essential hypertension-on furosemide ,  metoprolol   Hypokalemia-replenish potassium and continue to monitor  Hyperlipidemia-continue statin  CAD status post PCI-no chest pain, monitor  BPH-continue finasteride   Neuropathy-continue gabapentin   Scheduled Meds:  apixaban   5 mg Oral BID   azithromycin   500 mg Oral Daily   budesonide -glycopyrrolate -formoterol   2 puff Inhalation BID   dextromethorphan -guaiFENesin   1 tablet Oral BID   feeding supplement  1 Bottle Oral BID BM   finasteride   5 mg Oral Daily   furosemide   40 mg Intravenous BID   gabapentin   600 mg Oral QHS   ipratropium-albuterol   3 mL Nebulization Q4H   methylPREDNISolone  (SOLU-MEDROL ) injection  40 mg Intravenous Q12H   metoprolol  tartrate  25 mg Oral BID   multivitamin with minerals  1 tablet Oral Daily   potassium chloride   10 mEq Oral Daily   pravastatin   20 mg Oral Daily   QUEtiapine   50 mg Oral QHS   Continuous Infusions: PRN Meds:.ipratropium-albuterol , ondansetron  **OR** ondansetron  (ZOFRAN ) IV, senna-docusate  Current Outpatient Medications  Medication Instructions   albuterol  (PROVENTIL  HFA;VENTOLIN  HFA) 108 (90 BASE) MCG/ACT inhaler 2 puffs, Every 4 hours PRN   allopurinol  (ZYLOPRIM ) 100 mg, Every morning   amoxicillin -clavulanate (AUGMENTIN ) 875-125 MG tablet 1 tablet, Oral, 2 times daily   apixaban  (ELIQUIS ) 5 mg, Oral, 2 times daily   azithromycin  (ZITHROMAX ) 250 MG tablet Take two tabs day 1 then one tab daily until finished   budesonide -glycopyrrolate -formoterol  (BREZTRI ) 160-9-4.8 MCG/ACT AERO inhaler 2 puffs, Inhalation, 2 times daily   feeding supplement (ENSURE PLUS HIGH PROTEIN) LIQD 237 mLs, Oral, 2 times daily between meals  finasteride  (PROSCAR ) 5 mg, Every morning   furosemide  (LASIX ) 40 mg, Every morning   gabapentin  (NEURONTIN ) 600 mg, Daily at bedtime   ipratropium-albuterol  (DUONEB) 0.5-2.5 (3) MG/3ML SOLN 3 mLs, Nebulization, Every 4 hours PRN   metoprolol  tartrate (LOPRESSOR ) 25 MG tablet TAKE 1 TABLET TWICE DAILY    Multiple Vitamin (MULTIVITAMIN WITH MINERALS) TABS tablet 1 tablet, Oral, Daily   nitroGLYCERIN  (NITROSTAT ) 0.4 mg, Every 5 min PRN   OXYGEN  2-3 L/min, Continuous   potassium chloride  (KLOR-CON ) 10 MEQ tablet 10 mEq, Every morning   pravastatin  (PRAVACHOL ) 20 mg, Oral, Every evening   predniSONE  (DELTASONE ) 20 MG tablet Take 2 tablets with food daily x 5 days   QUEtiapine  (SEROQUEL ) 50 mg, Oral, Daily at bedtime   Tylenol  325-650 mg, Every 6 hours PRN    Diet Orders (From admission, onward)     Start     Ordered   10/13/23 0013  Diet Heart Room service appropriate? Yes; Fluid consistency: Thin  Diet effective now       Question Answer Comment  Room service appropriate? Yes   Fluid consistency: Thin      10/13/23 0012            DVT prophylaxis: apixaban  (ELIQUIS ) tablet 5 mg Start: 10/13/23 1000 apixaban  (ELIQUIS ) tablet 5 mg   Lab Results  Component Value Date   PLT 190 10/13/2023      Code Status: Limited: Do not attempt resuscitation (DNR) -DNR-LIMITED -Do Not Intubate/DNI   Family Communication: no family at bedside   Status is: Inpatient Remains inpatient appropriate because: severity of illness  Level of care: Telemetry  Consultants:  None   Objective: Vitals:   10/12/23 2100 10/13/23 0127 10/13/23 0540 10/13/23 0700  BP: (!) 152/80 (!) 156/80 129/77 (!) 152/80  Pulse:  84 63 69  Resp: 20 20 14 18   Temp:  97.7 F (36.5 C) 98.1 F (36.7 C)   TempSrc:  Oral    SpO2:  93% 98% 94%  Weight:      Height:        Intake/Output Summary (Last 24 hours) at 10/13/2023 0948 Last data filed at 10/13/2023 0101 Gross per 24 hour  Intake --  Output 1100 ml  Net -1100 ml   Wt Readings from Last 3 Encounters:  10/12/23 107.4 kg  09/24/23 102.1 kg  09/17/23 102.5 kg    Examination:  Constitutional: NAD Eyes: no scleral icterus ENMT: Mucous membranes are moist.  Neck: normal, supple Respiratory: Overall distant breath sounds but clear, no  wheezing Cardiovascular: Regular rate and rhythm, no murmurs / rubs / gallops.  1+ pitting edema Abdomen: non distended, no tenderness. Bowel sounds positive.  Musculoskeletal: no clubbing / cyanosis.    Data Reviewed: I have independently reviewed following labs and imaging studies   CBC Recent Labs  Lab 10/12/23 2142 10/13/23 0725  WBC 9.1 9.0  HGB 15.0 14.8  HCT 48.5 48.1  PLT 196 190  MCV 92.9 93.8  MCH 28.7 28.8  MCHC 30.9 30.8  RDW 14.9 14.9  LYMPHSABS 0.7  --   MONOABS 0.4  --   EOSABS 0.0  --   BASOSABS 0.0  --     Recent Labs  Lab 10/12/23 2142 10/13/23 0548  NA 137 138  K 4.8 4.5  CL 98 96*  CO2 30 32  GLUCOSE 153* 198*  BUN 32* 34*  CREATININE 1.23 1.45*  CALCIUM  8.7* 8.4*  AST 20  --   ALT 19  --  ALKPHOS 61  --   BILITOT 0.7  --   ALBUMIN 3.0*  --   MG  --  2.6*  BNP 41.1  --     ------------------------------------------------------------------------------------------------------------------ No results for input(s): CHOL, HDL, LDLCALC, TRIG, CHOLHDL, LDLDIRECT in the last 72 hours.  Lab Results  Component Value Date   HGBA1C 6.0 (H) 05/12/2016   ------------------------------------------------------------------------------------------------------------------ No results for input(s): TSH, T4TOTAL, T3FREE, THYROIDAB in the last 72 hours.  Invalid input(s): FREET3  Cardiac Enzymes No results for input(s): CKMB, TROPONINI, MYOGLOBIN in the last 168 hours.  Invalid input(s): CK ------------------------------------------------------------------------------------------------------------------    Component Value Date/Time   BNP 41.1 10/12/2023 2142    CBG: No results for input(s): GLUCAP in the last 168 hours.  No results found for this or any previous visit (from the past 240 hours).   Radiology Studies: DG Chest Portable 1 View Result Date: 10/12/2023 CLINICAL DATA:  Shortness of breath. EXAM:  PORTABLE CHEST 1 VIEW COMPARISON:  None Available. FINDINGS: The heart size and mediastinal contours are within normal limits. There is no evidence of acute infiltrate, pleural effusion or pneumothorax. Multilevel degenerative changes are seen throughout the thoracic spine. IMPRESSION: No active disease. Electronically Signed   By: Suzen Dials M.D.   On: 10/12/2023 20:57     Nilda Fendt, MD, PhD [facility]  Between 7 am - 7 pm I am available, please contact me via Amion (for emergencies) or Securechat (non urgent messages)  Between 7 pm - 7 am I am not available, please contact night coverage MD/APP via Amion

## 2023-10-14 ENCOUNTER — Encounter (HOSPITAL_COMMUNITY): Payer: Self-pay | Admitting: Student

## 2023-10-14 DIAGNOSIS — J9621 Acute and chronic respiratory failure with hypoxia: Secondary | ICD-10-CM | POA: Diagnosis not present

## 2023-10-14 LAB — CBC
HCT: 46.8 % (ref 39.0–52.0)
Hemoglobin: 14.6 g/dL (ref 13.0–17.0)
MCH: 29.1 pg (ref 26.0–34.0)
MCHC: 31.2 g/dL (ref 30.0–36.0)
MCV: 93.2 fL (ref 80.0–100.0)
Platelets: 200 10*3/uL (ref 150–400)
RBC: 5.02 MIL/uL (ref 4.22–5.81)
RDW: 15.3 % (ref 11.5–15.5)
WBC: 15.7 10*3/uL — ABNORMAL HIGH (ref 4.0–10.5)
nRBC: 0 % (ref 0.0–0.2)

## 2023-10-14 LAB — COMPREHENSIVE METABOLIC PANEL WITH GFR
ALT: 16 U/L (ref 0–44)
AST: 14 U/L — ABNORMAL LOW (ref 15–41)
Albumin: 2.7 g/dL — ABNORMAL LOW (ref 3.5–5.0)
Alkaline Phosphatase: 54 U/L (ref 38–126)
Anion gap: 9 (ref 5–15)
BUN: 46 mg/dL — ABNORMAL HIGH (ref 8–23)
CO2: 33 mmol/L — ABNORMAL HIGH (ref 22–32)
Calcium: 8.2 mg/dL — ABNORMAL LOW (ref 8.9–10.3)
Chloride: 95 mmol/L — ABNORMAL LOW (ref 98–111)
Creatinine, Ser: 1.49 mg/dL — ABNORMAL HIGH (ref 0.61–1.24)
GFR, Estimated: 46 mL/min — ABNORMAL LOW (ref >60)
Glucose, Bld: 135 mg/dL — ABNORMAL HIGH (ref 70–99)
Potassium: 4.3 mmol/L (ref 3.5–5.1)
Sodium: 137 mmol/L (ref 135–145)
Total Bilirubin: 0.6 mg/dL (ref 0.0–1.2)
Total Protein: 5.1 g/dL — ABNORMAL LOW (ref 6.5–8.1)

## 2023-10-14 LAB — GLUCOSE, CAPILLARY: Glucose-Capillary: 147 mg/dL — ABNORMAL HIGH (ref 70–99)

## 2023-10-14 LAB — MAGNESIUM: Magnesium: 2.5 mg/dL — ABNORMAL HIGH (ref 1.7–2.4)

## 2023-10-14 MED ORDER — MELATONIN 3 MG PO TABS
3.0000 mg | ORAL_TABLET | Freq: Every evening | ORAL | Status: AC | PRN
  Administered 2023-10-14 – 2023-10-15 (×2): 3 mg via ORAL
  Filled 2023-10-14 (×3): qty 1

## 2023-10-14 MED ORDER — IPRATROPIUM-ALBUTEROL 0.5-2.5 (3) MG/3ML IN SOLN
3.0000 mL | Freq: Two times a day (BID) | RESPIRATORY_TRACT | Status: DC
  Administered 2023-10-14 – 2023-10-16 (×4): 3 mL via RESPIRATORY_TRACT
  Filled 2023-10-14 (×4): qty 3

## 2023-10-14 MED ORDER — ACETAMINOPHEN 325 MG PO TABS
650.0000 mg | ORAL_TABLET | Freq: Four times a day (QID) | ORAL | Status: DC | PRN
  Administered 2023-10-15 – 2023-10-24 (×10): 650 mg via ORAL
  Filled 2023-10-14 (×10): qty 2

## 2023-10-14 NOTE — TOC Initial Note (Signed)
 Transition of Care [facility]) - Initial/Assessment Note    Patient Details  Name: Mitchell [name] MRN: [id number] Date of Birth: 08/05/1940  [facility]) CM/SW Contact:    Bascom Service, RN Phone Number: 10/14/2023, 1:41 PM  Clinical Narrative: spoke to [name](friend) about d/c plans-No preference HHC-HHRN/PT/OT-[facility];already has home 02-[facility]-has travel tank. Has own transport home.                  Expected Discharge Plan: Home w Home Health Services Barriers to Discharge: Continued Medical Work up   Patient Goals and CMS Choice Patient states their goals for this hospitalization and ongoing recovery are:: Home CMS Medicare.gov Compare Post Acute Care list provided to:: Patient Represenative (must comment)        Expected Discharge Plan and Services   Discharge Planning Services: CM Consult Post Acute Care Choice: Home Health Living arrangements for the past 2 months: Single Family Home                           HH Arranged: RN, Disease Management, OT HH Agency: [facility] Date HH Agency Contacted: 10/14/23 Time HH Agency Contacted: 1341 Representative spoke with at [facility] Agency: Arna  Prior Living Arrangements/Services Living arrangements for the past 2 months: Single Family Home Lives with:: Friends   Do you feel safe going back to the place where you live?: Yes          Current home services: DME ([facility] home 02)    Activities of Daily Living      Permission Sought/Granted                  Emotional Assessment              Admission diagnosis:  Hypoxia [R09.02] COPD exacerbation (HCC) [J44.1] Acute on chronic respiratory failure with hypoxia (HCC) [J96.21] Patient Active Problem List   Diagnosis Date Noted   COPD exacerbation (HCC) 09/13/2023   Atrial fibrillation, chronic (HCC) 03/25/2022   Acute on chronic diastolic CHF (congestive heart failure) (HCC) 08/24/2021   Muscle spasm 08/24/2021   Acute CHF  (congestive heart failure) (HCC) 08/23/2021   Acute on chronic respiratory failure with hypoxia (HCC) 08/23/2021   Obesity (BMI 30-39.9) 08/23/2021   COPD with acute exacerbation (HCC) 05/04/2021   Pulmonary nodule 05/02/2021   BPH (benign prostatic hyperplasia) 05/01/2021   Chronic congestive heart failure (HCC) 05/01/2021   Effusion, right knee 01/10/2017   Osteoarthritis of right knee 01/10/2017   Gout of right knee 01/10/2017   Dehydration    Atypical atrial flutter (HCC)    Dizziness 01/04/2017   Right shoulder pain 01/04/2017   Hypokalemia 01/04/2017   Influenza 05/10/2016   CKD (chronic kidney disease), stage III (HCC) 05/10/2016   Acute respiratory failure with hypoxia (HCC) 05/02/2016   Acute kidney injury superimposed on chronic kidney disease (HCC) 05/02/2016   Gout flare 05/02/2016   Chronic respiratory failure with hypoxia (HCC) 03/15/2015   COPD with acute exacerbation (HCC) 01/31/2015   Vision impairment 01/31/2015   Right foot pain 01/31/2015   Obesity 01/31/2015   Alcohol  use 01/31/2015   COPD (chronic obstructive pulmonary disease) (HCC) 04/30/2013   Dyspnea 02/14/2012   Dyslipidemia    History of depression    Chest pain 01/23/2012   Coronary artery disease involving native coronary artery of native heart with angina pectoris (HCC) 01/23/2012   Cholelithiasis    Hypoxia 05/01/2011   Shortness of breath  04/29/2011   Essential hypertension 04/29/2011   PCP:  Okey Carlin Redbird, MD Pharmacy:   [id number] [place], [place] - [address] [place] [address] Phone: [phone] Fax: [phone]  [facility] - [facility] [address] [place] [address] Phone: [phone] Fax: [phone]     Social Drivers of Health (SDOH) Social History: SDOH Screenings   Food Insecurity: Food Insecurity Present (10/13/2023)  Housing: Low Risk  (10/13/2023)  Transportation Needs: Unmet Transportation Needs  (10/13/2023)  Utilities: At Risk (10/13/2023)  Alcohol  Screen: Low Risk  (08/24/2021)  Depression (PHQ2-9): Low Risk  (02/07/2019)  Social Connections: Socially Isolated (10/13/2023)  Tobacco Use: Medium Risk (09/24/2023)   SDOH Interventions:     Readmission Risk Interventions    09/17/2023   12:02 PM  Readmission Risk Prevention Plan  Transportation Screening Complete  PCP or Specialist Appt within 3-5 Days Complete  HRI or Home Care Consult Complete  Social Work Consult for Recovery Care Planning/Counseling Complete  Palliative Care Screening Not Applicable  Medication Review Oceanographer) Complete

## 2023-10-14 NOTE — Progress Notes (Signed)
 Heart Failure Navigator Progress Note  Assessed for Heart & Vascular TOC clinic readiness.  Patient does not meet criteria due to EF 55-60%, has a scheduled Pulmonary appointment on 10/23/2023. No HF TOC. .   Navigator will sign off at this time.    Stephane Haddock, BSN, Scientist, clinical (histocompatibility and immunogenetics) Only

## 2023-10-14 NOTE — Evaluation (Signed)
 Physical Therapy Evaluation Patient Details Name: Mitchell [name] MRN: [id number] DOB: [birth year] Today's Date: 10/14/2023  History of Present Illness  Mitchell [name] is a 83 y.o. admitted to [facility] on 10/12/23 with dx of  Acute on chronic diastolic CHF and respiratory failure with hypoxia, with possible COPD exacerbation. PMH significant for COPD on 2-3 L O2 home oxygen , CAD, chronic diastolic CHF (last echo 12/2016), Hx NSTEMI.  Clinical Impression  Pt admitted with above diagnosis. At baseline, pt lives alone with intermittent assistance.  He typically ambulates with 2 canes, but has rollator if needed.  Pt does report increasing difficulty with ADLs and endurance with mobility.  Today, he needed min A for transfers and ambulated 100'x2 with standing rest break and CGA.  Pt was on 2 L O2 rest but needed 3 L O2 with activity to maintain >90% (see comments).  Pt with a few recent admissions for COPD/CHF but is not open to SNF at d/c.  Recommend return home with HHPT.  Could potentially benefit from increased support/aides at home if pt willing/able to afford.  Pt currently with functional limitations due to the deficits listed below (see PT Problem List). Pt will benefit from acute skilled PT to increase their independence and safety with mobility to allow discharge.           If plan is discharge home, recommend the following: A little help with walking and/or transfers;A little help with bathing/dressing/bathroom;Assistance with cooking/housework;Help with stairs or ramp for entrance   Can travel by private vehicle        Equipment Recommendations None recommended by PT  Recommendations for Other Services       Functional Status Assessment Patient has had a recent decline in their functional status and demonstrates the ability to make significant improvements in function in a reasonable and predictable amount of time.     Precautions / Restrictions Precautions Precautions: Fall       Mobility  Bed Mobility               General bed mobility comments: in chair    Transfers Overall transfer level: Needs assistance Equipment used: Rolling walker (2 wheels) Transfers: Sit to/from Stand Sit to Stand: Min assist           General transfer comment: Stood from chair and toilet; needed assist for toileting ADLs    Ambulation/Gait Ambulation/Gait assistance: Contact guard assist Gait Distance (Feet): 100 Feet (100'x2) Assistive device: Rolling walker (2 wheels) Gait Pattern/deviations: Step-through pattern, Decreased stride length, Trunk flexed Gait velocity: decreased     General Gait Details: Cues to stay close to RW (pt used to rollator or 2 canes); took 1 standing/leaning rest break; pt legally blind but does fairly well navigating around obstacles without cues  Stairs            Wheelchair Mobility     Tilt Bed    Modified Rankin (Stroke Patients Only)       Balance Overall balance assessment: Needs assistance Sitting-balance support: No upper extremity supported Sitting balance-Leahy Scale: Good     Standing balance support: Bilateral upper extremity supported, Reliant on assistive device for balance Standing balance-Leahy Scale: Poor                               Pertinent Vitals/Pain Pain Assessment Pain Assessment: No/denies pain    Home Living Family/patient expects to be discharged to:: Private residence Living  Arrangements: Alone Available Help at Discharge: Family;Available PRN/intermittently (ex-wife assist with transportation/groceries as needed) Type of Home: Apartment Home Access: Level entry       Home Layout: One level Home Equipment: Rollator (4 wheels);Wheelchair - power;BSC/3in1;Cane - single point Additional Comments: (P) has had to sponge bathe, amb with SPC and rollator, assist for groceries and transportation, amb short distances only    Prior Function Prior Level of Function : Needs  assist             Mobility Comments: Pt using bil canes for ambulation; could ambulate short community distances (able to walk out to graveyard but it fatigues him sometimes) ADLs Comments: He was modified independent to independent with ADLs but does have difficulty, he does not drive, and his ex-spouse stopped by regularly to assist him with cleaning, laundry, and taking him to appointments.     Extremity/Trunk Assessment   Upper Extremity Assessment Upper Extremity Assessment: Defer to OT evaluation    Lower Extremity Assessment Lower Extremity Assessment: Overall WFL for tasks assessed;RLE deficits/detail;LLE deficits/detail RLE Sensation: history of peripheral neuropathy LLE Sensation: history of peripheral neuropathy       Communication   Communication Factors Affecting Communication: Hearing impaired;Other (comment) (legally blind)    Cognition Arousal: Alert Behavior During Therapy: Impulsive (mild impulsivity with toiletign urgency)   PT - Cognitive impairments: No apparent impairments                                 Cueing       General Comments General comments (skin integrity, edema, etc.): Pt on 2 L O2 rest with sats 92%; dropped to 88% on 2 L ambulating to bathroom; up to 3 L for hallway ambulation with sats 96%; back to 2 L end of session sats 94%.  Pt educated on gradual increase in activity with rest breaks as needed. Educated on energy conservation techniques.  Pt walks out to graveyard frequently to visit son's grave.  Reports other day it was very difficulty.  He does go in morning before extreme heat.  Discussed using rollator but reports to unlevel ground.  Recommended having whoever is with him push the rollator, he use his canes, and then he would have rollator for seat/rest as needed.    Exercises     Assessment/Plan    PT Assessment Patient needs continued PT services  PT Problem List Decreased strength;Cardiopulmonary status  limiting activity;Decreased activity tolerance;Decreased knowledge of use of DME;Decreased balance;Decreased mobility       PT Treatment Interventions DME instruction;Therapeutic exercise;Gait training;Stair training;Functional mobility training;Therapeutic activities;Patient/family education;Balance training    PT Goals (Current goals can be found in the Care Plan section)  Acute Rehab PT Goals Patient Stated Goal: return home PT Goal Formulation: With patient/family Time For Goal Achievement: 10/28/23 Potential to Achieve Goals: Good    Frequency Min 2X/week     Co-evaluation PT/OT/SLP Co-Evaluation/Treatment: Yes Reason for Co-Treatment: Complexity of the patient's impairments (multi-system involvement);For patient/therapist safety PT goals addressed during session: Mobility/safety with mobility OT goals addressed during session: ADL's and self-care       AM-PAC PT 6 Clicks Mobility  Outcome Measure Help needed turning from your back to your side while in a flat bed without using bedrails?: A Little Help needed moving from lying on your back to sitting on the side of a flat bed without using bedrails?: A Little Help needed moving to and from a  bed to a chair (including a wheelchair)?: A Little Help needed standing up from a chair using your arms (e.g., wheelchair or bedside chair)?: A Little Help needed to walk in hospital room?: A Little Help needed climbing 3-5 steps with a railing? : A Lot 6 Click Score: 17    End of Session Equipment Utilized During Treatment: Gait belt;Oxygen  Activity Tolerance: Patient tolerated treatment well Patient left: in chair;with call bell/phone within reach;with family/visitor present;with chair alarm set Nurse Communication: Mobility status PT Visit Diagnosis: Other abnormalities of gait and mobility (R26.89);Muscle weakness (generalized) (M62.81)    Time: 8944-8863 PT Time Calculation (min) (ACUTE ONLY): 41 min   Charges:   PT  Evaluation $PT Eval Low Complexity: 1 Low PT Treatments $Gait Training: 8-22 mins PT General Charges $$ ACUTE PT VISIT: 1 Visit         Benjiman, PT Acute Rehab Services [facility] [phone]   Benjiman VEAR Mulberry 10/14/2023, 12:12 PM

## 2023-10-14 NOTE — Progress Notes (Signed)
 PROGRESS NOTE  Mitchell [name] FMW:[id number] DOB: 1940/06/30 DOA: 10/12/2023 PCP: Okey Carlin Redbird, MD   LOS: 2 days   Brief Narrative / Interim history: 83 year old male with history of COPD on 2 L at home, PAF on Eliquis , chronic hypoxia with 2 L at home, CAD status post PCI, CKD 3A, legally blind, comes into the hospital with shortness of breath and fluid retention.  He tells me that ever since she was discharged a month ago, continue to gain weight, and is up at least 20 pounds of water since then.  He has had pretty significant dyspnea on exertion, and worsening coughing.  He feels that his legs have become more swollen.  No nausea or vomiting.  He just finished prednisone  and a Z-Pak last week after calling his pulmonologist.  Subjective / 24h Interval events: Does not remember seeing a doctor yesterday  Assesement and Plan: Principal Problem:   Acute on chronic respiratory failure with hypoxia (HCC) Active Problems:   Hypoxia    Acute on chronic diastolic CHF-most recent 2D echo in 2023 showed LVEF 55-60%, LVH, grade 1 diastolic dysfunction.  RV was normal.  There was mild hypokinesis of the left ventricular, basal inferior walls.-He is here with fluid overload, has lower extremity edema, has been started on furosemide , continue.  Monitor ins and outs, daily weights - Echo was not helpful as patient was uncooperative during exam Continue IV Lasix  for now as patient still not able to lay flat   Acute on chronic hypoxic respiratory failure-at home he is on 2 L, has slightly higher oxygen  needs now at 3 L.  Has had wheezing on admission and was started on Solu-Medrol , nebulizers  Refusing Z-Pak as he says it gives him a headache  Possible COPD exacerbation -no further wheezing this morning, transition steroids to prednisone  for couple additional days.  I think his wheezing may have been in the setting of #1 rather than true COPD exacerbation  PAF-in sinus, continue metoprolol ,  Eliquis   Essential hypertension-on furosemide , metoprolol   Hypokalemia-replenish potassium and continue to monitor  Hyperlipidemia-continue statin  CAD status post PCI-no chest pain, monitor  BPH-continue finasteride   Neuropathy-continue gabapentin     Scheduled Meds:  apixaban   5 mg Oral BID   budesonide -glycopyrrolate -formoterol   2 puff Inhalation BID   dextromethorphan -guaiFENesin   1 tablet Oral BID   feeding supplement  1 Bottle Oral BID BM   finasteride   5 mg Oral Daily   furosemide   40 mg Intravenous BID   gabapentin   600 mg Oral QHS   metoprolol  tartrate  25 mg Oral BID   multivitamin with minerals  1 tablet Oral Daily   potassium chloride   10 mEq Oral Daily   pravastatin   20 mg Oral Daily   predniSONE   40 mg Oral Q breakfast   QUEtiapine   50 mg Oral QHS   Continuous Infusions: PRN Meds:.acetaminophen , ipratropium-albuterol , ondansetron  **OR** ondansetron  (ZOFRAN ) IV, senna-docusate  Current Outpatient Medications  Medication Instructions   albuterol  (PROVENTIL  HFA;VENTOLIN  HFA) 108 (90 BASE) MCG/ACT inhaler 2 puffs, Inhalation, Every 4 hours PRN   allopurinol  (ZYLOPRIM ) 100 mg, Oral, Every morning   amoxicillin -clavulanate (AUGMENTIN ) 875-125 MG tablet 1 tablet, Oral, 2 times daily   apixaban  (ELIQUIS ) 5 mg, Oral, 2 times daily   azithromycin  (ZITHROMAX ) 250 MG tablet Take two tabs day 1 then one tab daily until finished   budesonide -glycopyrrolate -formoterol  (BREZTRI ) 160-9-4.8 MCG/ACT AERO inhaler 2 puffs, Inhalation, 2 times daily   feeding supplement (ENSURE PLUS HIGH PROTEIN) LIQD 237 mLs, Oral,  2 times daily between meals   finasteride  (PROSCAR ) 5 mg, Oral, Every morning, Reported on 05/04/2015   furosemide  (LASIX ) 40 mg, Oral, Every morning   gabapentin  (NEURONTIN ) 600 mg, Daily at bedtime   ipratropium-albuterol  (DUONEB) 0.5-2.5 (3) MG/3ML SOLN 3 mLs, Nebulization, Every 4 hours PRN   metoprolol  tartrate (LOPRESSOR ) 25 MG tablet TAKE 1 TABLET TWICE DAILY    nitroGLYCERIN  (NITROSTAT ) 0.4 mg, Sublingual, Every 5 min PRN   OXYGEN  2-3 L/min, Inhalation, Continuous   potassium chloride  (KLOR-CON ) 10 MEQ tablet 10 mEq, Oral, Every morning   pravastatin  (PRAVACHOL ) 20 mg, Oral, Every evening   predniSONE  (DELTASONE ) 20 MG tablet Take 2 tablets with food daily x 5 days   QUEtiapine  (SEROQUEL ) 50 mg, Oral, Daily at bedtime   Tylenol  325-650 mg, Oral, Every 6 hours PRN    Diet Orders (From admission, onward)     Start     Ordered   10/13/23 0013  Diet Heart Room service appropriate? Yes; Fluid consistency: Thin  Diet effective now       Question Answer Comment  Room service appropriate? Yes   Fluid consistency: Thin      10/13/23 0012            DVT prophylaxis:  apixaban  (ELIQUIS ) tablet 5 mg   Lab Results  Component Value Date   PLT 200 10/14/2023      Code Status: Limited: Do not attempt resuscitation (DNR) -DNR-LIMITED -Do Not Intubate/DNI   Family Communication: no family at bedside   Status is: Inpatient Remains inpatient appropriate because: severity of illness  Level of care: Telemetry  Consultants:  None   Objective: Vitals:   10/13/23 2108 10/14/23 0500 10/14/23 0552 10/14/23 0811  BP:   127/69   Pulse:   (!) 56   Resp:   17   Temp:   98.4 F (36.9 C)   TempSrc:   Oral   SpO2: 96%  96% 98%  Weight:  102.8 kg    Height:        Intake/Output Summary (Last 24 hours) at 10/14/2023 1231 Last data filed at 10/14/2023 0910 Gross per 24 hour  Intake 300 ml  Output 1850 ml  Net -1550 ml   Wt Readings from Last 3 Encounters:  10/14/23 102.8 kg  09/24/23 102.1 kg  09/17/23 102.5 kg    Examination:   General: Appearance:    Obese male in no acute distress     Lungs:     respirations unlabored, no wheezing  Heart:    Bradycardic. irr  MS:   All extremities are intact.   Neurologic:   Awake, alert    Data Reviewed: I have independently reviewed following labs and imaging studies   CBC Recent Labs   Lab 10/12/23 2142 10/13/23 0725 10/14/23 0519  WBC 9.1 9.0 15.7*  HGB 15.0 14.8 14.6  HCT 48.5 48.1 46.8  PLT 196 190 200  MCV 92.9 93.8 93.2  MCH 28.7 28.8 29.1  MCHC 30.9 30.8 31.2  RDW 14.9 14.9 15.3  LYMPHSABS 0.7  --   --   MONOABS 0.4  --   --   EOSABS 0.0  --   --   BASOSABS 0.0  --   --     Recent Labs  Lab 10/12/23 2142 10/13/23 0548 10/14/23 0519  NA 137 138 137  K 4.8 4.5 4.3  CL 98 96* 95*  CO2 30 32 33*  GLUCOSE 153* 198* 135*  BUN 32* 34* 46*  CREATININE 1.23 1.45* 1.49*  CALCIUM  8.7* 8.4* 8.2*  AST 20  --  14*  ALT 19  --  16  ALKPHOS 61  --  54  BILITOT 0.7  --  0.6  ALBUMIN 3.0*  --  2.7*  MG  --  2.6* 2.5*  BNP 41.1  --   --     ------------------------------------------------------------------------------------------------------------------ No results for input(s): CHOL, HDL, LDLCALC, TRIG, CHOLHDL, LDLDIRECT in the last 72 hours.  Lab Results  Component Value Date   HGBA1C 6.0 (H) 05/12/2016   ------------------------------------------------------------------------------------------------------------------ No results for input(s): TSH, T4TOTAL, T3FREE, THYROIDAB in the last 72 hours.  Invalid input(s): FREET3  Cardiac Enzymes No results for input(s): CKMB, TROPONINI, MYOGLOBIN in the last 168 hours.  Invalid input(s): CK ------------------------------------------------------------------------------------------------------------------    Component Value Date/Time   BNP 41.1 10/12/2023 2142    CBG: No results for input(s): GLUCAP in the last 168 hours.  No results found for this or any previous visit (from the past 240 hours).   Radiology Studies: No results found.    Harlene Bowl DO [facility]  Between 7 am - 7 pm I am available, please contact me via Amion (for emergencies) or Securechat (non urgent messages)  Between 7 pm - 7 am I am not available, please contact night coverage  MD/APP via Amion

## 2023-10-14 NOTE — Plan of Care (Signed)

## 2023-10-14 NOTE — Evaluation (Signed)
 Occupational Therapy Evaluation Patient Details Name: Mitchell [name] MRN: [id number] DOB: 04/23/1940 Today's Date: 10/14/2023   History of Present Illness   Mitchell [name] is a 83 y.o. admitted to [facility] on 10/12/23 with dx of  Acute on chronic diastolic CHF and respiratory failure with hypoxia, with possible COPD exacerbation. PMH significant for COPD on 2-3 L O2 home oxygen , CAD, chronic diastolic CHF (last echo 12/2016), Hx NSTEMI.     Clinical Impressions PTA, patient was living in apartment alone with intermittent assistance from former wife and friend. Patient needed assist for transportation and groceries as well as higher level IADL's and was sponge bathing due to poor access into tub shower. Uses 2 canes and rollator for mobility as well as 3 ltrs baseline O2. Currently, patient presents with deficits outlined below (see OT Problem list for details) most significantly pain, O2 dep on 4-5 ltrs O2 depending on tolerance, decreased activity tolerance, functional strength and reach and visual and balance deficits limiting BADL's and functional mobility. OT to issue and train in use of toilet tongs for hygiene reach and ECT Handout education with follow up Acute hospital OT recommending. Anticipate patient will benefit from [facility] services upon discharge  with family support and assistance.      If plan is discharge home, recommend the following:   A little help with walking and/or transfers;A lot of help with bathing/dressing/bathroom;Assistance with cooking/housework;Direct supervision/assist for medications management;Direct supervision/assist for financial management;Assist for transportation;Help with stairs or ramp for entrance     Functional Status Assessment   Patient has had a recent decline in their functional status and demonstrates the ability to make significant improvements in function in a reasonable and predictable amount of time.     Equipment Recommendations   Other  (comment) (OT to provide toilet tongs)      Precautions/Restrictions   Precautions Precautions: Fall     Mobility Bed Mobility               General bed mobility comments: in chair    Transfers Overall transfer level: Needs assistance Equipment used: Rolling walker (2 wheels) Transfers: Sit to/from Stand Sit to Stand: Min assist           General transfer comment: Stood from chair and toilet; needed assist for toileting ADLs      Balance Overall balance assessment: Needs assistance Sitting-balance support: No upper extremity supported Sitting balance-Leahy Scale: Good     Standing balance support: Bilateral upper extremity supported, Reliant on assistive device for balance Standing balance-Leahy Scale: Poor                             ADL either performed or assessed with clinical judgement   ADL Overall ADL's : Needs assistance/impaired Eating/Feeding: Set up;Sitting   Grooming: Wash/dry hands;Wash/dry face;Set up;Sitting   Upper Body Bathing: Set up;Sitting   Lower Body Bathing: Moderate assistance;Sit to/from stand   Upper Body Dressing : Set up;Sitting   Lower Body Dressing: Moderate assistance;Sit to/from stand   Toilet Transfer: Minimal assistance;Regular Toilet;Rolling walker (2 wheels)   Toileting- Clothing Manipulation and Hygiene: Moderate assistance;Sit to/from stand Toileting - Clothing Manipulation Details (indicate cue type and reason): needs toilet tongs training and educ on low cost bidet option     Functional mobility during ADLs: Minimal assistance;Rolling walker (2 wheels) General ADL Comments: decreased functional reach for hygiene and LB self care, cues for pacing and breathing integration  Vision Baseline Vision/History: 2 Legally blind Ability to See in Adequate Light: 1 Impaired Patient Visual Report: No change from baseline Vision Assessment?:  (no new issues but has been legally blind for years as per  patient report)            Pertinent Vitals/Pain Pain Assessment Pain Assessment: No/denies pain     Extremity/Trunk Assessment Upper Extremity Assessment Upper Extremity Assessment: Overall WFL for tasks assessed;Right hand dominant   Lower Extremity Assessment Lower Extremity Assessment: Defer to PT evaluation RLE Sensation: history of peripheral neuropathy LLE Sensation: history of peripheral neuropathy   Cervical / Trunk Assessment Cervical / Trunk Assessment: Normal   Communication Communication Communication: No apparent difficulties Factors Affecting Communication: Hearing impaired;Other (comment) (legally blind)   Cognition Arousal: Alert Behavior During Therapy: Impulsive (mild impulsivity with toiletign urgency)                                 Following commands: Intact       Cueing  General Comments   Cueing Techniques: Verbal cues;Tactile cues  see PT for SpO2 response to amb and transfers this eval, focussed on urinary and bowel control which rushes overall tasks, mild + edema Bly, discoloration on lower legs and arms d/t vascular changes, needs cues for pacing and breathing integration   Exercises     Shoulder Instructions      Home Living Family/patient expects to be discharged to:: Private residence Living Arrangements: Alone Available Help at Discharge: Family;Available PRN/intermittently (ex-wife assist with transportation/groceries as needed) Type of Home: Apartment Home Access: Level entry     Home Layout: One level     Bathroom Shower/Tub: Chief Strategy Officer: Standard Bathroom Accessibility: Yes   Home Equipment: Rollator (4 wheels);Wheelchair - power;BSC/3in1;Cane - single point   Additional Comments: increased difficulty the past months with all aspects of IADL's and some ADL's      Prior Functioning/Environment Prior Level of Function : Needs assist             Mobility Comments: Pt using bil  canes for ambulation; could ambulate short community distances (able to walk out to graveyard but it fatigues him sometimes) ADLs Comments: He was modified independent to independent with ADLs but does have difficulty, he does not drive, and his ex-spouse stopped by regularly to assist him with cleaning, laundry, and taking him to appointments.    OT Problem List: Decreased strength;Decreased activity tolerance;Impaired balance (sitting and/or standing);Impaired vision/perception;Decreased safety awareness;Decreased knowledge of use of DME or AE;Decreased knowledge of precautions;Cardiopulmonary status limiting activity;Impaired sensation;Pain;Increased edema;Obesity   OT Treatment/Interventions: Self-care/ADL training;Therapeutic exercise;Neuromuscular education;Energy conservation;DME and/or AE instruction;Therapeutic activities;Visual/perceptual remediation/compensation;Patient/family education;Balance training      OT Goals(Current goals can be found in the care plan section)   Acute Rehab OT Goals Patient Stated Goal: to get stronger OT Goal Formulation: With patient/family Time For Goal Achievement: 10/28/23 Potential to Achieve Goals: Good ADL Goals Pt Will Perform Lower Body Bathing: with modified independence;with adaptive equipment;sit to/from stand Pt Will Perform Lower Body Dressing: with modified independence;with adaptive equipment;sit to/from stand Pt Will Transfer to Toilet: with modified independence;ambulating;grab bars Pt Will Perform Toileting - Clothing Manipulation and hygiene: with adaptive equipment;with modified independence;sit to/from stand Additional ADL Goal #1: Patient will teach back 4/4 ECT's during ADL's and mobility with min cues   OT Frequency:  Min 2X/week    Co-evaluation   Reason for Co-Treatment: Complexity  of the patient's impairments (multi-system involvement);For patient/therapist safety PT goals addressed during session: Mobility/safety with  mobility OT goals addressed during session: ADL's and self-care      AM-PAC OT 6 Clicks Daily Activity     Outcome Measure Help from another person eating meals?: A Little Help from another person taking care of personal grooming?: A Little Help from another person toileting, which includes using toliet, bedpan, or urinal?: A Little Help from another person bathing (including washing, rinsing, drying)?: A Lot Help from another person to put on and taking off regular upper body clothing?: A Little Help from another person to put on and taking off regular lower body clothing?: A Lot 6 Click Score: 16   End of Session Equipment Utilized During Treatment: Gait belt;Rolling walker (2 wheels);Oxygen  Nurse Communication: Mobility status;Other (comment) (bowl data)  Activity Tolerance: Patient tolerated treatment well Patient left: in chair;with call bell/phone within reach;with chair alarm set;with family/visitor present  OT Visit Diagnosis: Unsteadiness on feet (R26.81);Muscle weakness (generalized) (M62.81);Low vision, both eyes (H54.2);Pain Pain - part of body:  (B feet due to PN pain)                Time: 8941-8874 OT Time Calculation (min): 27 min Charges:  OT General Charges $OT Visit: 1 Visit OT Evaluation $OT Eval Low Complexity: 1 Low OT Treatments $Self Care/Home Management : 8-22 mins  Gabryelle Whitmoyer OT/L Acute Rehabilitation Department  [phone]  10/14/2023, 12:59 PM

## 2023-10-15 ENCOUNTER — Encounter (HOSPITAL_COMMUNITY): Payer: Self-pay | Admitting: Student

## 2023-10-15 DIAGNOSIS — J9621 Acute and chronic respiratory failure with hypoxia: Secondary | ICD-10-CM | POA: Diagnosis not present

## 2023-10-15 LAB — CBC
HCT: 47.8 % (ref 39.0–52.0)
Hemoglobin: 15.1 g/dL (ref 13.0–17.0)
MCH: 29 pg (ref 26.0–34.0)
MCHC: 31.6 g/dL (ref 30.0–36.0)
MCV: 91.7 fL (ref 80.0–100.0)
Platelets: 194 10*3/uL (ref 150–400)
RBC: 5.21 MIL/uL (ref 4.22–5.81)
RDW: 15.2 % (ref 11.5–15.5)
WBC: 11.8 10*3/uL — ABNORMAL HIGH (ref 4.0–10.5)
nRBC: 0 % (ref 0.0–0.2)

## 2023-10-15 LAB — BASIC METABOLIC PANEL WITH GFR
Anion gap: 8 (ref 5–15)
BUN: 51 mg/dL — ABNORMAL HIGH (ref 8–23)
CO2: 36 mmol/L — ABNORMAL HIGH (ref 22–32)
Calcium: 8.8 mg/dL — ABNORMAL LOW (ref 8.9–10.3)
Chloride: 93 mmol/L — ABNORMAL LOW (ref 98–111)
Creatinine, Ser: 1.48 mg/dL — ABNORMAL HIGH (ref 0.61–1.24)
GFR, Estimated: 47 mL/min — ABNORMAL LOW (ref >60)
Glucose, Bld: 122 mg/dL — ABNORMAL HIGH (ref 70–99)
Potassium: 4.2 mmol/L (ref 3.5–5.1)
Sodium: 137 mmol/L (ref 135–145)

## 2023-10-15 MED ORDER — DOXYCYCLINE HYCLATE 100 MG PO TABS
100.0000 mg | ORAL_TABLET | Freq: Two times a day (BID) | ORAL | Status: AC
  Administered 2023-10-15 – 2023-10-22 (×16): 100 mg via ORAL
  Filled 2023-10-15 (×16): qty 1

## 2023-10-15 MED ORDER — BENZONATATE 100 MG PO CAPS
100.0000 mg | ORAL_CAPSULE | Freq: Three times a day (TID) | ORAL | Status: DC | PRN
  Administered 2023-10-16 – 2023-10-23 (×9): 100 mg via ORAL
  Filled 2023-10-15 (×10): qty 1

## 2023-10-15 NOTE — Progress Notes (Signed)
 Occupational Therapy Treatment Patient Details Name: Mitchell [name] MRN: [id number] DOB: 02/14/1941 Today's Date: 10/15/2023   History of present illness Mitchell [name] is a 83 y.o. admitted to [facility] on 10/12/23 with dx of  Acute on chronic diastolic CHF and respiratory failure with hypoxia, with possible COPD exacerbation. PMH significant for COPD on 2-3 L O2 home oxygen , CAD, chronic diastolic CHF (last echo 12/2016), Hx NSTEMI.   OT comments  Patient seen for OT session this afternoon. Patient open to all presented therapy including AE for functional LB reach including reacher and toilet tongs for hygiene. Educated in use and patient able to demonstrate teach back with cues. Energy Conservation handouts provided and initiated principles with OT rec for family also to assist due to patient's low vision to read material.  Patient requires continued Acute care hospital level OT services to progress safety and functional performance and allow for discharge with recommendation for home with [facility] services and family support remaining appropriate.        If plan is discharge home, recommend the following:  A little help with walking and/or transfers;A lot of help with bathing/dressing/bathroom;Assistance with cooking/housework;Direct supervision/assist for medications management;Direct supervision/assist for financial management;Assist for transportation;Help with stairs or ramp for entrance   Equipment Recommendations  None recommended by OT;Other (comment) (issued reacher and toilet tongs this session)       Precautions / Restrictions Precautions Precautions: Fall Restrictions Weight Bearing Restrictions Per Provider Order: No       Mobility Bed Mobility Overal bed mobility: Needs Assistance Bed Mobility: Supine to Sit     Supine to sit: Supervision, HOB elevated, Used rails          Transfers Overall transfer level: Needs assistance Equipment used: Rolling walker (2  wheels) Transfers: Sit to/from Stand, Bed to chair/wheelchair/BSC Sit to Stand: Min assist, Via lift equipment     Step pivot transfers: Min assist, From elevated surface     General transfer comment: min cues for forward weight shifts and hand placement to power up, min cues for seated push ups x 5 with breathing and rests     Balance Overall balance assessment: Needs assistance Sitting-balance support: No upper extremity supported Sitting balance-Leahy Scale: Good     Standing balance support: Bilateral upper extremity supported, Reliant on assistive device for balance Standing balance-Leahy Scale: Poor                             ADL either performed or assessed with clinical judgement   ADL Overall ADL's : Needs assistance/impaired Eating/Feeding: Set up;Sitting   Grooming: Wash/dry hands;Wash/dry face;Brushing hair;Sitting;Set up   Upper Body Bathing: Set up;Sitting   Lower Body Bathing: Minimal assistance;Sit to/from stand   Upper Body Dressing : Set up;Sitting   Lower Body Dressing: Minimal assistance;Sit to/from stand Lower Body Dressing Details (indicate cue type and reason): training with use of reacher and issued Toilet Transfer: Minimal assistance;Grab bars;Ambulation;Rolling walker (2 wheels) Toilet Transfer Details (indicate cue type and reason): cues for forward weight shifts to power up Toileting- Clothing Manipulation and Hygiene: Moderate assistance;Sitting/lateral lean Toileting - Clothing Manipulation Details (indicate cue type and reason): issued toilet tongs and initiated training     Functional mobility during ADLs: Minimal assistance;Rolling walker (2 wheels) General ADL Comments: educated on basic ECT with Handoutas provided for caregivers, toilet tongs and reacher training intiated    Extremity/Trunk Assessment Upper Extremity Assessment Upper Extremity Assessment: Overall [facility]  for tasks assessed   Lower Extremity Assessment Lower  Extremity Assessment: Defer to PT evaluation        Vision   Vision Assessment?: Yes Additional Comments: no new vision issues, legally blind         Communication Communication Communication: Impaired Factors Affecting Communication: Hearing impaired;Other (comment) (has R ear amplifier)   Cognition Arousal: Alert Behavior During Therapy: Impulsive Cognition: No apparent impairments                               Following commands: Intact        Cueing   Cueing Techniques: Verbal cues, Tactile cues  Exercises Exercises: Other exercises (issued foam grasp ball for 10 reps Bly with min cues)    Shoulder Instructions       General Comments min cues for pacing and breathing integration, improved LE edema to none noted    Pertinent Vitals/ Pain       Pain Assessment Pain Assessment: Faces Faces Pain Scale: Hurts a little bit Pain Location: B feet- PN Pain Descriptors / Indicators: Tingling, Burning Pain Intervention(s): Monitored during session   Frequency  Min 2X/week        Progress Toward Goals  OT Goals(current goals can now be found in the care plan section)  Progress towards OT goals: Progressing toward goals  Acute Rehab OT Goals Patient Stated Goal: to feel better OT Goal Formulation: With patient Time For Goal Achievement: 10/28/23 Potential to Achieve Goals: Good ADL Goals Pt Will Perform Lower Body Bathing: with modified independence;with adaptive equipment;sit to/from stand Pt Will Perform Lower Body Dressing: with modified independence;with adaptive equipment;sit to/from stand Pt Will Transfer to Toilet: with modified independence;ambulating;grab bars Pt Will Perform Toileting - Clothing Manipulation and hygiene: with adaptive equipment;with modified independence;sit to/from stand Additional ADL Goal #1: Patient will teach back 4/4 ECT's during ADL's and mobility with min cues  Plan         AM-PAC OT 6 Clicks Daily Activity      Outcome Measure   Help from another person eating meals?: A Little Help from another person taking care of personal grooming?: A Little Help from another person toileting, which includes using toliet, bedpan, or urinal?: A Little Help from another person bathing (including washing, rinsing, drying)?: A Lot Help from another person to put on and taking off regular upper body clothing?: A Little Help from another person to put on and taking off regular lower body clothing?: A Lot 6 Click Score: 16    End of Session Equipment Utilized During Treatment: Gait belt;Rolling walker (2 wheels);Oxygen   OT Visit Diagnosis: Unsteadiness on feet (R26.81);Muscle weakness (generalized) (M62.81);Low vision, both eyes (H54.2);Pain Pain - part of body:  (feet due to PN)   Activity Tolerance Patient tolerated treatment well   Patient Left in chair;with call bell/phone within reach;with chair alarm set;with family/visitor present   Nurse Communication Mobility status;Other (comment)        Time: 8751-8682 OT Time Calculation (min): 29 min  Charges: OT General Charges $OT Visit: 1 Visit OT Treatments $Self Care/Home Management : 8-22 mins  Vergil Burby OT/L Acute Rehabilitation Department  [phone]  10/15/2023, 3:57 PM

## 2023-10-15 NOTE — Progress Notes (Addendum)
 PROGRESS NOTE  Mitchell [name] FMW:[id number] DOB: [birth year] DOA: 10/12/2023 PCP: Okey Carlin Redbird, MD   LOS: 3 days   Brief Narrative / Interim history: 83 year old male with history of COPD on 2 L at home, PAF on Eliquis , chronic hypoxia with 2 L at home, CAD status post PCI, CKD 3A, legally blind, comes into the hospital with shortness of breath and fluid retention.  He tells me that ever since she was discharged a month ago, continue to gain weight, and is up at least 20 pounds of water since then.  He has had pretty significant dyspnea on exertion, and worsening coughing.  He feels that his legs have become more swollen.  No nausea or vomiting.  He just finished prednisone  and a Z-Pak last week after calling his pulmonologist.  Subjective / 24h Interval events: Continues to complain of cough  Assesement and Plan: Principal Problem:   Acute on chronic respiratory failure with hypoxia (HCC) Active Problems:   Hypoxia    Acute on chronic diastolic CHF-most recent 2D echo in 2023 showed LVEF 55-60%, LVH, grade 1 diastolic dysfunction.  RV was normal.  There was mild hypokinesis of the left ventricular, basal inferior walls.-He is here with fluid overload, has lower extremity edema, has been started on furosemide , continue.  Monitor ins and outs, daily weights - Echo was not helpful as patient was uncooperative during exam Continue IV Lasix  for now as patient continues to diurese-3.4 L yesterday -daily labs -daily weights/strict I/Os   Acute on chronic hypoxic respiratory failure-at home he is on 2 L, has slightly higher oxygen  needs now at 3 L.  Has had wheezing on admission and was started on Solu-Medrol , nebulizers  Refusing Z-Pak as he says it gives him a headache-changed to Doxy  Possible COPD exacerbation -no further wheezing--- transitioned steroids to prednisone  x 2 days  I think his wheezing may have been in the setting of #1 rather than true COPD exacerbation  PAF-in  sinus, continue metoprolol , Eliquis   Essential hypertension-on furosemide , metoprolol   Hypokalemia-replenish potassium and continue to monitor  Hyperlipidemia-continue statin  CAD status post PCI-no chest pain, monitor  BPH-continue finasteride   Neuropathy-continue gabapentin     Scheduled Meds:  apixaban   5 mg Oral BID   budesonide -glycopyrrolate -formoterol   2 puff Inhalation BID   dextromethorphan -guaiFENesin   1 tablet Oral BID   doxycycline   100 mg Oral Q12H   feeding supplement  1 Bottle Oral BID BM   finasteride   5 mg Oral Daily   furosemide   40 mg Intravenous BID   gabapentin   600 mg Oral QHS   ipratropium-albuterol   3 mL Nebulization BID   metoprolol  tartrate  25 mg Oral BID   multivitamin with minerals  1 tablet Oral Daily   potassium chloride   10 mEq Oral Daily   pravastatin   20 mg Oral Daily   QUEtiapine   50 mg Oral QHS   Continuous Infusions: PRN Meds:.acetaminophen , benzonatate , ipratropium-albuterol , melatonin, ondansetron  **OR** ondansetron  (ZOFRAN ) IV, senna-docusate  Current Outpatient Medications  Medication Instructions   albuterol  (PROVENTIL  HFA;VENTOLIN  HFA) 108 (90 BASE) MCG/ACT inhaler 2 puffs, Inhalation, Every 4 hours PRN   allopurinol  (ZYLOPRIM ) 100 mg, Oral, Every morning   amoxicillin -clavulanate (AUGMENTIN ) 875-125 MG tablet 1 tablet, Oral, 2 times daily   apixaban  (ELIQUIS ) 5 mg, Oral, 2 times daily   azithromycin  (ZITHROMAX ) 250 MG tablet Take two tabs day 1 then one tab daily until finished   budesonide -glycopyrrolate -formoterol  (BREZTRI ) 160-9-4.8 MCG/ACT AERO inhaler 2 puffs, Inhalation, 2 times daily  feeding supplement (ENSURE PLUS HIGH PROTEIN) LIQD 237 mLs, Oral, 2 times daily between meals   finasteride  (PROSCAR ) 5 mg, Oral, Every morning, Reported on 05/04/2015   furosemide  (LASIX ) 40 mg, Oral, Every morning   gabapentin  (NEURONTIN ) 600 mg, Daily at bedtime   ipratropium-albuterol  (DUONEB) 0.5-2.5 (3) MG/3ML SOLN 3 mLs,  Nebulization, Every 4 hours PRN   metoprolol  tartrate (LOPRESSOR ) 25 MG tablet TAKE 1 TABLET TWICE DAILY   nitroGLYCERIN  (NITROSTAT ) 0.4 mg, Sublingual, Every 5 min PRN   OXYGEN  2-3 L/min, Inhalation, Continuous   potassium chloride  (KLOR-CON ) 10 MEQ tablet 10 mEq, Oral, Every morning   pravastatin  (PRAVACHOL ) 20 mg, Oral, Every evening   predniSONE  (DELTASONE ) 20 MG tablet Take 2 tablets with food daily x 5 days   QUEtiapine  (SEROQUEL ) 50 mg, Oral, Daily at bedtime   Tylenol  325-650 mg, Oral, Every 6 hours PRN    Diet Orders (From admission, onward)     Start     Ordered   10/13/23 0013  Diet Heart Room service appropriate? Yes; Fluid consistency: Thin  Diet effective now       Question Answer Comment  Room service appropriate? Yes   Fluid consistency: Thin      10/13/23 0012            DVT prophylaxis:  apixaban  (ELIQUIS ) tablet 5 mg   Lab Results  Component Value Date   PLT 194 10/15/2023      Code Status: Limited: Do not attempt resuscitation (DNR) -DNR-LIMITED -Do Not Intubate/DNI   Family Communication: no family at bedside   Status is: Inpatient Remains inpatient appropriate because: severity of illness  Level of care: Telemetry  Consultants:  None   Objective: Vitals:   10/15/23 0500 10/15/23 0630 10/15/23 0846 10/15/23 0957  BP:  131/68  132/64  Pulse:  (!) 59  60  Resp:      Temp:  98.5 F (36.9 C)    TempSrc:  Oral    SpO2:  95% 96%   Weight: 102.5 kg     Height:        Intake/Output Summary (Last 24 hours) at 10/15/2023 1113 Last data filed at 10/15/2023 1001 Gross per 24 hour  Intake 898 ml  Output 3900 ml  Net -3002 ml   Wt Readings from Last 3 Encounters:  10/15/23 102.5 kg  09/24/23 102.1 kg  09/17/23 102.5 kg    Examination:   General: Appearance:    Obese male in no acute distress     Lungs:     respirations unlabored, no wheezing, diminished  Heart:    Normal heart rate. irr  MS:   All extremities are intact.    Neurologic:   Awake, alert, hard of hearing, poor insight into disease process    Data Reviewed: I have independently reviewed following labs and imaging studies   CBC Recent Labs  Lab 10/12/23 2142 10/13/23 0725 10/14/23 0519 10/15/23 0513  WBC 9.1 9.0 15.7* 11.8*  HGB 15.0 14.8 14.6 15.1  HCT 48.5 48.1 46.8 47.8  PLT 196 190 200 194  MCV 92.9 93.8 93.2 91.7  MCH 28.7 28.8 29.1 29.0  MCHC 30.9 30.8 31.2 31.6  RDW 14.9 14.9 15.3 15.2  LYMPHSABS 0.7  --   --   --   MONOABS 0.4  --   --   --   EOSABS 0.0  --   --   --   BASOSABS 0.0  --   --   --  Recent Labs  Lab 10/12/23 2142 10/13/23 0548 10/14/23 0519 10/15/23 0513  NA 137 138 137 137  K 4.8 4.5 4.3 4.2  CL 98 96* 95* 93*  CO2 30 32 33* 36*  GLUCOSE 153* 198* 135* 122*  BUN 32* 34* 46* 51*  CREATININE 1.23 1.45* 1.49* 1.48*  CALCIUM  8.7* 8.4* 8.2* 8.8*  AST 20  --  14*  --   ALT 19  --  16  --   ALKPHOS 61  --  54  --   BILITOT 0.7  --  0.6  --   ALBUMIN 3.0*  --  2.7*  --   MG  --  2.6* 2.5*  --   BNP 41.1  --   --   --     ------------------------------------------------------------------------------------------------------------------ No results for input(s): CHOL, HDL, LDLCALC, TRIG, CHOLHDL, LDLDIRECT in the last 72 hours.  Lab Results  Component Value Date   HGBA1C 6.0 (H) 05/12/2016   ------------------------------------------------------------------------------------------------------------------ No results for input(s): TSH, T4TOTAL, T3FREE, THYROIDAB in the last 72 hours.  Invalid input(s): FREET3  Cardiac Enzymes No results for input(s): CKMB, TROPONINI, MYOGLOBIN in the last 168 hours.  Invalid input(s): CK ------------------------------------------------------------------------------------------------------------------    Component Value Date/Time   BNP 41.1 10/12/2023 2142    CBG: Recent Labs  Lab 10/14/23 2124  GLUCAP 147*    No results  found for this or any previous visit (from the past 240 hours).   Radiology Studies: No results found.    Harlene Bowl DO [facility]  Between 7 am - 7 pm I am available, please contact me via Amion (for emergencies) or Securechat (non urgent messages)  Between 7 pm - 7 am I am not available, please contact night coverage MD/APP via Amion

## 2023-10-16 DIAGNOSIS — J9621 Acute and chronic respiratory failure with hypoxia: Secondary | ICD-10-CM | POA: Diagnosis not present

## 2023-10-16 DIAGNOSIS — J441 Chronic obstructive pulmonary disease with (acute) exacerbation: Secondary | ICD-10-CM | POA: Diagnosis not present

## 2023-10-16 LAB — BASIC METABOLIC PANEL WITH GFR
Anion gap: 9 (ref 5–15)
BUN: 59 mg/dL — ABNORMAL HIGH (ref 8–23)
CO2: 33 mmol/L — ABNORMAL HIGH (ref 22–32)
Calcium: 9 mg/dL (ref 8.9–10.3)
Chloride: 96 mmol/L — ABNORMAL LOW (ref 98–111)
Creatinine, Ser: 1.47 mg/dL — ABNORMAL HIGH (ref 0.61–1.24)
GFR, Estimated: 47 mL/min — ABNORMAL LOW (ref >60)
Glucose, Bld: 138 mg/dL — ABNORMAL HIGH (ref 70–99)
Potassium: 4.1 mmol/L (ref 3.5–5.1)
Sodium: 138 mmol/L (ref 135–145)

## 2023-10-16 LAB — CBC
HCT: 46.4 % (ref 39.0–52.0)
Hemoglobin: 14.9 g/dL (ref 13.0–17.0)
MCH: 29.1 pg (ref 26.0–34.0)
MCHC: 32.1 g/dL (ref 30.0–36.0)
MCV: 90.6 fL (ref 80.0–100.0)
Platelets: 201 10*3/uL (ref 150–400)
RBC: 5.12 MIL/uL (ref 4.22–5.81)
RDW: 15.3 % (ref 11.5–15.5)
WBC: 12.3 10*3/uL — ABNORMAL HIGH (ref 4.0–10.5)
nRBC: 0.2 % (ref 0.0–0.2)

## 2023-10-16 MED ORDER — METHOCARBAMOL 500 MG PO TABS
500.0000 mg | ORAL_TABLET | Freq: Once | ORAL | Status: AC
  Administered 2023-10-16: 500 mg via ORAL
  Filled 2023-10-16: qty 1

## 2023-10-16 MED ORDER — METHYLPREDNISOLONE SODIUM SUCC 40 MG IJ SOLR
40.0000 mg | Freq: Two times a day (BID) | INTRAMUSCULAR | Status: DC
  Administered 2023-10-16 – 2023-10-21 (×12): 40 mg via INTRAVENOUS
  Filled 2023-10-16 (×12): qty 1

## 2023-10-16 MED ORDER — GUAIFENESIN ER 600 MG PO TB12
600.0000 mg | ORAL_TABLET | Freq: Two times a day (BID) | ORAL | Status: DC
  Administered 2023-10-16 – 2023-10-25 (×19): 600 mg via ORAL
  Filled 2023-10-16 (×19): qty 1

## 2023-10-16 MED ORDER — KETOROLAC TROMETHAMINE 15 MG/ML IJ SOLN
15.0000 mg | Freq: Once | INTRAMUSCULAR | Status: AC
  Administered 2023-10-16: 15 mg via INTRAVENOUS
  Filled 2023-10-16: qty 1

## 2023-10-16 MED ORDER — IPRATROPIUM-ALBUTEROL 0.5-2.5 (3) MG/3ML IN SOLN
3.0000 mL | Freq: Four times a day (QID) | RESPIRATORY_TRACT | Status: DC
  Administered 2023-10-16 – 2023-10-17 (×6): 3 mL via RESPIRATORY_TRACT
  Filled 2023-10-16 (×7): qty 3

## 2023-10-16 MED ORDER — PHENOL 1.4 % MT LIQD
1.0000 | OROMUCOSAL | Status: DC | PRN
  Filled 2023-10-16: qty 177

## 2023-10-16 NOTE — Progress Notes (Addendum)
 [facility]  PROGRESS NOTE  GRADIE OHM FMW:[id number] DOB: 1940/07/21 DOA: 10/12/2023 PCP: Okey Carlin Redbird, MD   Brief HPI:   83 year old male with history of COPD on 2 L at home, PAF on Eliquis , chronic hypoxia with 2 L at home, CAD status post PCI, CKD 3A, legally blind, comes into the hospital with shortness of breath and fluid retention. He tells me that ever since she was discharged a month ago, continue to gain weight, and is up at least 20 pounds of water since then. He has had pretty significant dyspnea on exertion, and worsening coughing. He feels that his legs have become more swollen. No nausea or vomiting. He just finished prednisone  and a Z-Pak last week after calling his pulmonologist.     Assessment/Plan:     Acute on chronic hypoxic respiratory failure-at home he is on 2 L, has slightly higher oxygen  needs now at 3 L.  Has had wheezing on admission and was started on Solu-Medrol , nebulizers  Refusing Z-Pak as he says it gives him a headache-changed to Doxy    COPD exacerbation  -Patient has bilateral rhonchi on exam -Will start Solu-Medrol  40 mg IV every 12 hours -Continue DuoNebs every 6 hours -Continue doxycycline   Musculoskeletal chest pain - Complains of chest pain after echo was done - Says he was pushed too hard on chest wall which caused pain - Will give 1 dose of Toradol  15 mg IV, 1 dose of Robaxin  500 mg p.o.  Chronic diastolic CHF-most recent 2D echo in 2023 showed LVEF 55-60%, LVH, grade 1 diastolic dysfunction.  RV was normal.  There was mild hypokinesis of the left ventricular, basal inferior walls.-He is here with fluid overload, has lower extremity edema, has been started on furosemide , continue.  Monitor ins and outs, daily weights - Echo was not helpful as patient was uncooperative during exam -Will discontinue IV Lasix , patient does not appear volume overloaded at this time.   PAF-in sinus, continue metoprolol , Eliquis    Essential  hypertension-on  metoprolol    Hypokalemia-replenish potassium and continue to monitor   Hyperlipidemia-continue statin   CAD status post PCI-no chest pain, monitor   BPH-continue finasteride    Neuropathy-continue gabapentin     Medications     apixaban   5 mg Oral BID   budesonide -glycopyrrolate -formoterol   2 puff Inhalation BID   dextromethorphan -guaiFENesin   1 tablet Oral BID   doxycycline   100 mg Oral Q12H   feeding supplement  1 Bottle Oral BID BM   finasteride   5 mg Oral Daily   furosemide   40 mg Intravenous BID   gabapentin   600 mg Oral QHS   ipratropium-albuterol   3 mL Nebulization BID   metoprolol  tartrate  25 mg Oral BID   multivitamin with minerals  1 tablet Oral Daily   potassium chloride   10 mEq Oral Daily   pravastatin   20 mg Oral Daily   QUEtiapine   50 mg Oral QHS     Data Reviewed:   CBG:  Recent Labs  Lab 10/14/23 2124  GLUCAP 147*    SpO2: 94 % O2 Flow Rate (L/min): 2 L/min FiO2 (%): 32 %    Vitals:   10/15/23 2020 10/15/23 2024 10/16/23 0500 10/16/23 0608  BP:  138/80  138/73  Pulse:  69  (!) 55  Resp:  20  18  Temp:  97.8 F (36.6 C)  98 F (36.7 C)  TempSrc:  Oral  Oral  SpO2: 94% 98%  94%  Weight:   103.6 kg  Height:          Data Reviewed:  Basic Metabolic Panel: Recent Labs  Lab 10/12/23 2142 10/13/23 0548 10/14/23 0519 10/15/23 0513 10/16/23 0507  NA 137 138 137 137 138  K 4.8 4.5 4.3 4.2 4.1  CL 98 96* 95* 93* 96*  CO2 30 32 33* 36* 33*  GLUCOSE 153* 198* 135* 122* 138*  BUN 32* 34* 46* 51* 59*  CREATININE 1.23 1.45* 1.49* 1.48* 1.47*  CALCIUM  8.7* 8.4* 8.2* 8.8* 9.0  MG  --  2.6* 2.5*  --   --     CBC: Recent Labs  Lab 10/12/23 2142 10/13/23 0725 10/14/23 0519 10/15/23 0513 10/16/23 0507  WBC 9.1 9.0 15.7* 11.8* 12.3*  NEUTROABS 7.8*  --   --   --   --   HGB 15.0 14.8 14.6 15.1 14.9  HCT 48.5 48.1 46.8 47.8 46.4  MCV 92.9 93.8 93.2 91.7 90.6  PLT 196 190 200 194 201    LFT Recent Labs  Lab  10/12/23 2142 10/14/23 0519  AST 20 14*  ALT 19 16  ALKPHOS 61 54  BILITOT 0.7 0.6  PROT 5.5* 5.1*  ALBUMIN 3.0* 2.7*     Antibiotics: Anti-infectives (From admission, onward)    Start     Dose/Rate Route Frequency Ordered Stop   10/15/23 0800  doxycycline  (VIBRA -TABS) tablet 100 mg        100 mg Oral Every 12 hours 10/15/23 0750     10/13/23 1000  azithromycin  (ZITHROMAX ) tablet 500 mg  Status:  Discontinued        500 mg Oral Daily 10/13/23 0304 10/14/23 1119        DVT prophylaxis: Eliquis   Code Status: DNR  Family Communication:    CONSULTS    Subjective   Complains of coughing up phlegm.  Also complains of chest wall pain after echocardiogram   Objective    Physical Examination:   General-appears in no acute distress Heart-S1-S2, regular, no murmur auscultated Lungs-bilateral rhonchi auscultated Abdomen-soft, nontender, no organomegaly Extremities-no edema in the lower extremities Neuro-alert, oriented x3, no focal deficit noted  Status is: Inpatient:             Kemberly Taves S Emmie Frakes   [facility] If 7PM-7AM, please contact night-coverage at [email or web address], Office  [phone]   10/16/2023, 8:11 AM  LOS: 4 days

## 2023-10-16 NOTE — Progress Notes (Signed)
 Physical Therapy Treatment Patient Details Name: Mitchell [name] MRN: [id number] DOB: 01-Mar-1941 Today's Date: 10/16/2023   History of Present Illness Mitchell [name] is a 83 y.o. admitted to [facility] on 10/12/23 with dx of  Acute on chronic diastolic CHF and respiratory failure with hypoxia, with possible COPD exacerbation. PMH significant for COPD on 2-3 L O2 home oxygen , CAD, chronic diastolic CHF (last echo 12/2016), Hx NSTEMI.    PT Comments  AxO x 3 HOH and legally blind (can see shapes/shadows/no colors) a little grumpy but that's because he does not feel good.  States, he feels no better with max c/o difficulty breathing lungs feel heavy and wet. He lives home alone and his ex wife helps with groccery shopping, light house keeping and transportation to appts. Assisted OOB to amb.  General bed mobility comments: requires increased time, HOB elevated and use of rail to transfer to EOB as well as back to bed. General transfer comment: Pt was self able to rise from elevated bed but did need assistance from lower level toilet.  Pt tends to use momentum and forward weight shift to rise due to enlarged ABD girth.  Pt also required assist with peri care after BM as he was unable to reach. General Gait Details: used Rollator this session as he has one at home.  Pt was able to amb a great distance but required a seated rest break on Rollator seat after 55 feet.  Dyspnea is 3/4 with sats 94% on 2 lts.  Audible crackles with a weak cough/unable to clear.   Pt states, I still feel like crap, no better. Pt plans to D/C back home when medically cleared.     If plan is discharge home, recommend the following: A little help with walking and/or transfers;A little help with bathing/dressing/bathroom;Assistance with cooking/housework;Help with stairs or ramp for entrance   Can travel by private vehicle        Equipment Recommendations  None recommended by PT    Recommendations for Other Services        Precautions / Restrictions Precautions Precautions: Fall Precaution/Restrictions Comments: Home oxygen  2 lts Restrictions Weight Bearing Restrictions Per Provider Order: No     Mobility  Bed Mobility Overal bed mobility: Needs Assistance Bed Mobility: Supine to Sit, Sit to Supine     Supine to sit: Supervision Sit to supine: Contact guard assist   General bed mobility comments: requires increased time, HOB elevated and use of rail to transfer to EOB as well as back to bed.    Transfers Overall transfer level: Needs assistance Equipment used: Rollator (4 wheels) Transfers: Sit to/from Stand Sit to Stand: Contact guard assist, Min assist           General transfer comment: Pt was self able to rise from elevated bed but did need assistance from lower level toilet.  Pt tends to use momentum and forward weight shift to rise due to enlarged ABD girth.  Pt also required assist with peri care after BM as he was unable to reach.    Ambulation/Gait Ambulation/Gait assistance: Contact guard assist, Supervision Gait Distance (Feet): 110 Feet Assistive device: Rollator (4 wheels) Gait Pattern/deviations: Step-through pattern, Decreased stride length, Trunk flexed Gait velocity: decreased     General Gait Details: used Rollator this session as he has one at home.  Pt was able to amb a great distance but required a seated rest break on Rollator seat after 55 feet.  Dyspnea is 3/4 with sats 94%  on 2 lts.  Audible crackles with a weak cough/unable to clear.   Pt states, I still feel like crap, no better.   Stairs             Wheelchair Mobility     Tilt Bed    Modified Rankin (Stroke Patients Only)       Balance                                            Communication    Cognition Arousal: Alert Behavior During Therapy: WFL for tasks assessed/performed   PT - Cognitive impairments: No apparent impairments                        PT - Cognition Comments: AxO x 3 HOH and legally blind (can see shapes/shadows/no colors) a little grumpy but that's because he does not feel good.  States, he feels no better with max c/o difficulty breathing lungs feel heavy and wet. He lives home alone and his ex wife helps with groccery shopping, light house keeping and transportation to appts.        Cueing    Exercises      General Comments        Pertinent Vitals/Pain Pain Assessment Pain Assessment: No/denies pain    Home Living                          Prior Function            PT Goals (current goals can now be found in the care plan section) Progress towards PT goals: Progressing toward goals    Frequency    Min 2X/week      PT Plan      Co-evaluation              AM-PAC PT 6 Clicks Mobility   Outcome Measure  Help needed turning from your back to your side while in a flat bed without using bedrails?: A Little Help needed moving from lying on your back to sitting on the side of a flat bed without using bedrails?: A Little Help needed moving to and from a bed to a chair (including a wheelchair)?: A Little Help needed standing up from a chair using your arms (e.g., wheelchair or bedside chair)?: A Little Help needed to walk in hospital room?: A Little Help needed climbing 3-5 steps with a railing? : A Lot 6 Click Score: 17    End of Session Equipment Utilized During Treatment: Gait belt;Oxygen  Activity Tolerance: Patient tolerated treatment well Patient left: with call bell/phone within reach;in bed;with bed alarm set;with family/visitor present Nurse Communication: Mobility status PT Visit Diagnosis: Other abnormalities of gait and mobility (R26.89);Muscle weakness (generalized) (M62.81)     Time: 8586-8558 PT Time Calculation (min) (ACUTE ONLY): 28 min  Charges:    $Gait Training: 8-22 mins $Therapeutic Activity: 8-22 mins PT General Charges $$ ACUTE PT VISIT:  1 Visit                     Katheryn Leap  PTA Acute  Rehabilitation Services Office M-F          [phone]

## 2023-10-16 NOTE — TOC Progression Note (Signed)
 Transition of Care [facility]) - Progression Note    Patient Details  Name: Mitchell [name] MRN: [id number] Date of Birth: 27-Apr-1940  [facility] CM/SW Contact  Braxton Weisbecker, Nathanel, RN Phone Number: 10/16/2023, 10:18 AM  Clinical Narrative:   spoke to [name](friend) about d/c plans-No preference HHC-HHRN/PT/OT-[facility];already has home 02-[facility]-has travel tank. Has own transport home.     Expected Discharge Plan: Home w Home Health Services Barriers to Discharge: Continued Medical Work up  Expected Discharge Plan and Services   Discharge Planning Services: CM Consult Post Acute Care Choice: Home Health Living arrangements for the past 2 months: Single Family Home                           HH Arranged: RN, Disease Management, OT HH Agency: [facility] Date HH Agency Contacted: 10/14/23 Time HH Agency Contacted: 1341 Representative spoke with at [facility] Agency: Arna   Social Determinants of Health (SDOH) Interventions SDOH Screenings   Food Insecurity: Food Insecurity Present (10/13/2023)  Housing: Low Risk  (10/13/2023)  Transportation Needs: Unmet Transportation Needs (10/13/2023)  Utilities: At Risk (10/13/2023)  Alcohol  Screen: Low Risk  (08/24/2021)  Depression (PHQ2-9): Low Risk  (02/07/2019)  Social Connections: Socially Isolated (10/13/2023)  Tobacco Use: Medium Risk (10/15/2023)    Readmission Risk Interventions    09/17/2023   12:02 PM  Readmission Risk Prevention Plan  Transportation Screening Complete  PCP or Specialist Appt within 3-5 Days Complete  HRI or Home Care Consult Complete  Social Work Consult for Recovery Care Planning/Counseling Complete  Palliative Care Screening Not Applicable  Medication Review Oceanographer) Complete

## 2023-10-16 NOTE — Plan of Care (Signed)

## 2023-10-17 DIAGNOSIS — J9621 Acute and chronic respiratory failure with hypoxia: Secondary | ICD-10-CM | POA: Diagnosis not present

## 2023-10-17 DIAGNOSIS — J441 Chronic obstructive pulmonary disease with (acute) exacerbation: Secondary | ICD-10-CM | POA: Diagnosis not present

## 2023-10-17 LAB — BASIC METABOLIC PANEL WITH GFR
Anion gap: 13 (ref 5–15)
BUN: 59 mg/dL — ABNORMAL HIGH (ref 8–23)
CO2: 27 mmol/L (ref 22–32)
Calcium: 8.6 mg/dL — ABNORMAL LOW (ref 8.9–10.3)
Chloride: 94 mmol/L — ABNORMAL LOW (ref 98–111)
Creatinine, Ser: 1.4 mg/dL — ABNORMAL HIGH (ref 0.61–1.24)
GFR, Estimated: 50 mL/min — ABNORMAL LOW (ref >60)
Glucose, Bld: 241 mg/dL — ABNORMAL HIGH (ref 70–99)
Potassium: 4.8 mmol/L (ref 3.5–5.1)
Sodium: 134 mmol/L — ABNORMAL LOW (ref 135–145)

## 2023-10-17 MED ORDER — IPRATROPIUM-ALBUTEROL 0.5-2.5 (3) MG/3ML IN SOLN
3.0000 mL | RESPIRATORY_TRACT | Status: DC
  Administered 2023-10-17 – 2023-10-25 (×41): 3 mL via RESPIRATORY_TRACT
  Filled 2023-10-17 (×43): qty 3

## 2023-10-17 MED ORDER — DEXTROMETHORPHAN POLISTIREX ER 30 MG/5ML PO SUER
15.0000 mg | Freq: Two times a day (BID) | ORAL | Status: DC
  Administered 2023-10-17 – 2023-10-25 (×16): 15 mg via ORAL
  Filled 2023-10-17 (×16): qty 5

## 2023-10-17 MED ORDER — SIMETHICONE 80 MG PO CHEW
80.0000 mg | CHEWABLE_TABLET | Freq: Four times a day (QID) | ORAL | Status: AC
  Administered 2023-10-17 – 2023-10-19 (×10): 80 mg via ORAL
  Filled 2023-10-17 (×10): qty 1

## 2023-10-17 NOTE — Progress Notes (Signed)
 [facility]  PROGRESS NOTE  Mitchell [name] FMW:[id number] DOB: October 13, 1940 DOA: 10/12/2023 PCP: Okey Carlin Redbird, MD   Brief HPI:   83 year old male with history of COPD on 2 L at home, PAF on Eliquis , chronic hypoxia with 2 L at home, CAD status post PCI, CKD 3A, legally blind, comes into the hospital with shortness of breath and fluid retention. He tells me that ever since she was discharged a month ago, continue to gain weight, and is up at least 20 pounds of water since then. He has had pretty significant dyspnea on exertion, and worsening coughing. He feels that his legs have become more swollen. No nausea or vomiting. He just finished prednisone  and a Z-Pak last week after calling his pulmonologist.     Assessment/Plan:     Acute on chronic hypoxic respiratory failure-at home he is on 2 L, has slightly higher oxygen  needs now at 3 L.  Has had wheezing on admission and was started on Solu-Medrol , nebulizers  Refusing Z-Pak as he says it gives him a headache-changed to Doxy    COPD exacerbation  -Patient has bilateral rhonchi on exam -Will start Solu-Medrol  40 mg IV every 12 hours -Continue DuoNebs every 6 hours -Continue doxycycline   Musculoskeletal chest pain -Resolved - Complains of chest pain after echo was done - Says he was pushed too hard on chest wall which caused pain - Was given  1 dose of Toradol  15 mg IV, 1 dose of Robaxin  500 mg p.o.  Chronic diastolic CHF-most recent 2D echo in 2023 showed LVEF 55-60%, LVH, grade 1 diastolic dysfunction.  RV was normal.  There was mild hypokinesis of the left ventricular, basal inferior walls.-He is here with fluid overload, has lower extremity edema, has been started on furosemide , continue.  Monitor ins and outs, daily weights - Echo was not helpful as patient was uncooperative during exam - IV Lasix  discontinued as patient does not appear volume overloaded at this time.   PAF-in sinus, continue metoprolol , Eliquis     Essential hypertension-on  metoprolol    Hypokalemia-replenish potassium and continue to monitor   Hyperlipidemia-continue statin   CAD status post PCI-no chest pain, monitor   BPH-continue finasteride    Neuropathy-continue gabapentin     Medications     apixaban   5 mg Oral BID   doxycycline   100 mg Oral Q12H   feeding supplement  1 Bottle Oral BID BM   finasteride   5 mg Oral Daily   gabapentin   600 mg Oral QHS   guaiFENesin   600 mg Oral BID   ipratropium-albuterol   3 mL Nebulization Q6H   methylPREDNISolone  (SOLU-MEDROL ) injection  40 mg Intravenous Q12H   metoprolol  tartrate  25 mg Oral BID   multivitamin with minerals  1 tablet Oral Daily   pravastatin   20 mg Oral Daily   QUEtiapine   50 mg Oral QHS   simethicone  80 mg Oral QID     Data Reviewed:   CBG:  Recent Labs  Lab 10/14/23 2124  GLUCAP 147*    SpO2: 95 % O2 Flow Rate (L/min): 2 L/min FiO2 (%): 32 %    Vitals:   10/16/23 1945 10/17/23 0449 10/17/23 0500 10/17/23 0923  BP: (!) 150/72 137/74    Pulse: 77 64    Resp: 18 18    Temp: 97.9 F (36.6 C) 98.5 F (36.9 C)    TempSrc: Oral Oral    SpO2: 94% 93%  95%  Weight:   101.6 kg   Height:  Data Reviewed:  Basic Metabolic Panel: Recent Labs  Lab 10/13/23 0548 10/14/23 0519 10/15/23 0513 10/16/23 0507 10/17/23 0856  NA 138 137 137 138 134*  K 4.5 4.3 4.2 4.1 4.8  CL 96* 95* 93* 96* 94*  CO2 32 33* 36* 33* 27  GLUCOSE 198* 135* 122* 138* 241*  BUN 34* 46* 51* 59* 59*  CREATININE 1.45* 1.49* 1.48* 1.47* 1.40*  CALCIUM  8.4* 8.2* 8.8* 9.0 8.6*  MG 2.6* 2.5*  --   --   --     CBC: Recent Labs  Lab 10/12/23 2142 10/13/23 0725 10/14/23 0519 10/15/23 0513 10/16/23 0507  WBC 9.1 9.0 15.7* 11.8* 12.3*  NEUTROABS 7.8*  --   --   --   --   HGB 15.0 14.8 14.6 15.1 14.9  HCT 48.5 48.1 46.8 47.8 46.4  MCV 92.9 93.8 93.2 91.7 90.6  PLT 196 190 200 194 201    LFT Recent Labs  Lab 10/12/23 2142 10/14/23 0519  AST 20 14*   ALT 19 16  ALKPHOS 61 54  BILITOT 0.7 0.6  PROT 5.5* 5.1*  ALBUMIN 3.0* 2.7*     Antibiotics: Anti-infectives (From admission, onward)    Start     Dose/Rate Route Frequency Ordered Stop   10/15/23 0800  doxycycline  (VIBRA -TABS) tablet 100 mg        100 mg Oral Every 12 hours 10/15/23 0750     10/13/23 1000  azithromycin  (ZITHROMAX ) tablet 500 mg  Status:  Discontinued        500 mg Oral Daily 10/13/23 0304 10/14/23 1119        DVT prophylaxis: Eliquis   Code Status: DNR  Family Communication:    CONSULTS    Subjective   Still complains of lung congestion.  Able to cough up phlegm.   Objective    Physical Examination:  General-appears in no acute distress Heart-S1-S2, regular, no murmur auscultated Lungs-bilateral rhonchi auscultated Abdomen-soft, nontender, no organomegaly Extremities-no edema in the lower extremities Neuro-alert, oriented x3, no focal deficit noted  Status is: Inpatient:        [name] S [name]   [facility] If 7PM-7AM, please contact night-coverage at [email or web address], Office  [phone]   10/17/2023, 4:23 PM  LOS: 5 days

## 2023-10-17 NOTE — Progress Notes (Signed)
 PHYSICAL THERAPY  Pt just completed a walk with nursing and is OOB in recliner about to eat lunch.  Katheryn Leap  PTA Acute  Rehabilitation Services Office M-F          [phone]

## 2023-10-17 NOTE — Progress Notes (Signed)
 Pt complained of cough and difficulty breathing. MD notified. O2 at 92%. New cough medicine ordered. Given scheduled breathing treatment, new cough medication, and tylenol .

## 2023-10-18 DIAGNOSIS — J441 Chronic obstructive pulmonary disease with (acute) exacerbation: Secondary | ICD-10-CM | POA: Diagnosis not present

## 2023-10-18 DIAGNOSIS — J9621 Acute and chronic respiratory failure with hypoxia: Secondary | ICD-10-CM | POA: Diagnosis not present

## 2023-10-18 LAB — BASIC METABOLIC PANEL WITH GFR
Anion gap: 11 (ref 5–15)
BUN: 59 mg/dL — ABNORMAL HIGH (ref 8–23)
CO2: 28 mmol/L (ref 22–32)
Calcium: 9 mg/dL (ref 8.9–10.3)
Chloride: 94 mmol/L — ABNORMAL LOW (ref 98–111)
Creatinine, Ser: 1.37 mg/dL — ABNORMAL HIGH (ref 0.61–1.24)
GFR, Estimated: 51 mL/min — ABNORMAL LOW (ref >60)
Glucose, Bld: 174 mg/dL — ABNORMAL HIGH (ref 70–99)
Potassium: 5.1 mmol/L (ref 3.5–5.1)
Sodium: 133 mmol/L — ABNORMAL LOW (ref 135–145)

## 2023-10-18 MED ORDER — SODIUM CHLORIDE 3 % IN NEBU
4.0000 mL | INHALATION_SOLUTION | Freq: Two times a day (BID) | RESPIRATORY_TRACT | Status: AC
  Administered 2023-10-18 – 2023-10-20 (×6): 4 mL via RESPIRATORY_TRACT
  Filled 2023-10-18 (×7): qty 4

## 2023-10-18 NOTE — Progress Notes (Signed)
 PT Cancellation Note  Patient Details Name: Mitchell [name] MRN: [id number] DOB: Jun 17, 1940   Cancelled Treatment:     attempted to see twice Am wanted to rest come by later, stated Pt Pt just got out of bathroom and too fatigued to amb in hallway c/o extended time in BR and BM  Pt stated he feels very bloated and uncomfortable with pronounced enlarged ABD.  Rehab Team to attempt to see another day.   Katheryn Leap  PTA Acute  Rehabilitation Services Office M-F          [phone]

## 2023-10-18 NOTE — Progress Notes (Addendum)
 [facility]  PROGRESS NOTE  Mitchell [name] FMW:[id number] DOB: 1940-07-22 DOA: 10/12/2023 PCP: Mitchell Carlin Redbird, MD   Brief HPI:   83 year old male with history of COPD on 2 L at home, PAF on Eliquis , chronic hypoxia with 2 L at home, CAD status post PCI, CKD 3A, legally blind, comes into the hospital with shortness of breath and fluid retention. He tells me that ever since she was discharged a month ago, continue to gain weight, and is up at least 20 pounds of water since then. He has had pretty significant dyspnea on exertion, and worsening coughing. He feels that his legs have become more swollen. No nausea or vomiting. He just finished prednisone  and a Z-Pak last week after calling his pulmonologist.     Assessment/Plan:     Acute on chronic hypoxic respiratory failure-at home he is on 2 L, has slightly higher oxygen  needs now at 3 L.  Has had wheezing on admission and was started on Solu-Medrol , nebulizers  Refusing Z-Pak as he says it gives him a headache-changed to Doxy    COPD exacerbation  -Patient has bilateral rhonchi on exam -Will start Solu-Medrol  40 mg IV every 12 hours -Continue DuoNebs every 6 hours -Continue doxycycline  - Hypertonic saline twice a day  Musculoskeletal chest pain -Resolved - Complains of chest pain after echo was done - Says he was pushed too hard on chest wall which caused pain - Was given  1 dose of Toradol  15 mg IV, 1 dose of Robaxin  500 mg p.o.  Chronic diastolic CHF-most recent 2D echo in 2023 showed LVEF 55-60%, LVH, grade 1 diastolic dysfunction.  RV was normal.  There was mild hypokinesis of the left ventricular, basal inferior walls.- - Echo was not helpful as patient was uncooperative during exam - IV Lasix  discontinued as patient does not appear volume overloaded at this time.   PAF-in sinus, continue metoprolol , Eliquis    Essential hypertension-on  metoprolol    Hypokalemia-replete   Hyperlipidemia-continue statin   CAD  status post PCI-no chest pain, monitor   BPH-continue finasteride    Neuropathy-continue gabapentin     Medications     apixaban   5 mg Oral BID   dextromethorphan   15 mg Oral BID   doxycycline   100 mg Oral Q12H   feeding supplement  1 Bottle Oral BID BM   finasteride   5 mg Oral Daily   gabapentin   600 mg Oral QHS   guaiFENesin   600 mg Oral BID   ipratropium-albuterol   3 mL Nebulization Q4H   methylPREDNISolone  (SOLU-MEDROL ) injection  40 mg Intravenous Q12H   metoprolol  tartrate  25 mg Oral BID   multivitamin with minerals  1 tablet Oral Daily   pravastatin   20 mg Oral Daily   QUEtiapine   50 mg Oral QHS   simethicone   80 mg Oral QID     Data Reviewed:   CBG:  Recent Labs  Lab 10/14/23 2124  GLUCAP 147*    SpO2: 93 % O2 Flow Rate (L/min): 4 L/min FiO2 (%): 32 %    Vitals:   10/17/23 2155 10/17/23 2304 10/18/23 0308 10/18/23 0508  BP: 139/71   (!) 149/77  Pulse: 75   70  Resp: 18   18  Temp: (!) 97.5 F (36.4 C)   97.6 F (36.4 C)  TempSrc: Oral     SpO2: 96% 95% 96% 93%  Weight:      Height:          Data Reviewed:  Basic Metabolic Panel:  Recent Labs  Lab 10/13/23 0548 10/14/23 0519 10/15/23 0513 10/16/23 0507 10/17/23 0856 10/18/23 0503  NA 138 137 137 138 134* 133*  K 4.5 4.3 4.2 4.1 4.8 5.1  CL 96* 95* 93* 96* 94* 94*  CO2 32 33* 36* 33* 27 28  GLUCOSE 198* 135* 122* 138* 241* 174*  BUN 34* 46* 51* 59* 59* 59*  CREATININE 1.45* 1.49* 1.48* 1.47* 1.40* 1.37*  CALCIUM  8.4* 8.2* 8.8* 9.0 8.6* 9.0  MG 2.6* 2.5*  --   --   --   --     CBC: Recent Labs  Lab 10/12/23 2142 10/13/23 0725 10/14/23 0519 10/15/23 0513 10/16/23 0507  WBC 9.1 9.0 15.7* 11.8* 12.3*  NEUTROABS 7.8*  --   --   --   --   HGB 15.0 14.8 14.6 15.1 14.9  HCT 48.5 48.1 46.8 47.8 46.4  MCV 92.9 93.8 93.2 91.7 90.6  PLT 196 190 200 194 201    LFT Recent Labs  Lab 10/12/23 2142 10/14/23 0519  AST 20 14*  ALT 19 16  ALKPHOS 61 54  BILITOT 0.7 0.6  PROT  5.5* 5.1*  ALBUMIN 3.0* 2.7*     Antibiotics: Anti-infectives (From admission, onward)    Start     Dose/Rate Route Frequency Ordered Stop   10/15/23 0800  doxycycline  (VIBRA -TABS) tablet 100 mg        100 mg Oral Every 12 hours 10/15/23 0750     10/13/23 1000  azithromycin  (ZITHROMAX ) tablet 500 mg  Status:  Discontinued        500 mg Oral Daily 10/13/23 0304 10/14/23 1119        DVT prophylaxis: Eliquis   Code Status: DNR  Family Communication:    CONSULTS    Subjective   Still having difficulty with coughing up phlegm   Objective    Physical Examination:  General-appears in no acute distress Heart-S1-S2, regular, no murmur auscultated Lungs-bilateral rhonchi auscultated Abdomen-soft, nontender, no organomegaly Extremities-no edema in the lower extremities Neuro-alert, oriented x3, no focal deficit noted  Status is: Inpatient:        Mitchell [name]   [facility] If 7PM-7AM, please contact night-coverage at [email or web address], Office  [phone]   10/18/2023, 8:17 AM  LOS: 6 days

## 2023-10-19 ENCOUNTER — Inpatient Hospital Stay (HOSPITAL_COMMUNITY)

## 2023-10-19 DIAGNOSIS — J441 Chronic obstructive pulmonary disease with (acute) exacerbation: Secondary | ICD-10-CM | POA: Diagnosis not present

## 2023-10-19 DIAGNOSIS — J9621 Acute and chronic respiratory failure with hypoxia: Secondary | ICD-10-CM | POA: Diagnosis not present

## 2023-10-19 LAB — BASIC METABOLIC PANEL WITH GFR
Anion gap: 13 (ref 5–15)
BUN: 58 mg/dL — ABNORMAL HIGH (ref 8–23)
CO2: 28 mmol/L (ref 22–32)
Calcium: 9 mg/dL (ref 8.9–10.3)
Chloride: 94 mmol/L — ABNORMAL LOW (ref 98–111)
Creatinine, Ser: 1.35 mg/dL — ABNORMAL HIGH (ref 0.61–1.24)
GFR, Estimated: 52 mL/min — ABNORMAL LOW (ref >60)
Glucose, Bld: 233 mg/dL — ABNORMAL HIGH (ref 70–99)
Potassium: 5.2 mmol/L — ABNORMAL HIGH (ref 3.5–5.1)
Sodium: 135 mmol/L (ref 135–145)

## 2023-10-19 MED ORDER — LORATADINE 10 MG PO TABS
10.0000 mg | ORAL_TABLET | Freq: Every day | ORAL | Status: DC
  Administered 2023-10-19 – 2023-10-25 (×7): 10 mg via ORAL
  Filled 2023-10-19 (×7): qty 1

## 2023-10-19 MED ORDER — SODIUM ZIRCONIUM CYCLOSILICATE 5 G PO PACK
5.0000 g | PACK | Freq: Once | ORAL | Status: AC
  Administered 2023-10-19: 5 g via ORAL
  Filled 2023-10-19: qty 1

## 2023-10-19 MED ORDER — FUROSEMIDE 40 MG PO TABS
40.0000 mg | ORAL_TABLET | Freq: Every day | ORAL | Status: DC
  Administered 2023-10-19 – 2023-10-25 (×7): 40 mg via ORAL
  Filled 2023-10-19: qty 2
  Filled 2023-10-19 (×6): qty 1

## 2023-10-19 MED ORDER — POLYETHYLENE GLYCOL 3350 17 G PO PACK
17.0000 g | PACK | Freq: Every day | ORAL | Status: DC
  Administered 2023-10-19 – 2023-10-25 (×6): 17 g via ORAL
  Filled 2023-10-19 (×7): qty 1

## 2023-10-19 MED ORDER — BISACODYL 10 MG RE SUPP
10.0000 mg | Freq: Once | RECTAL | Status: AC
  Administered 2023-10-19: 10 mg via RECTAL
  Filled 2023-10-19: qty 1

## 2023-10-19 NOTE — Progress Notes (Signed)
 PT Cancellation Note  Patient Details Name: Mitchell [name] MRN: [id number] DOB: [birth year]   Cancelled Treatment:    Reason Eval/Treat Not Completed: Patient declined, no reason specified Pt declined to participate twice this morning.   Tari CROME Payson 10/19/2023, 1:36 PM Tari KLEIN, DPT Physical Therapist Acute Rehabilitation Services Office: [phone]

## 2023-10-19 NOTE — Progress Notes (Addendum)
 [facility]  PROGRESS NOTE  ALECSANDER HATTABAUGH FMW:[id number] DOB: 11/12/1940 DOA: 10/12/2023 PCP: Okey Carlin Redbird, MD   Brief HPI:   83 year old male with history of COPD on 2 L at home, PAF on Eliquis , chronic hypoxia with 2 L at home, CAD status post PCI, CKD 3A, legally blind, comes into the hospital with shortness of breath and fluid retention. He tells me that ever since she was discharged a month ago, continue to gain weight, and is up at least 20 pounds of water since then. He has had pretty significant dyspnea on exertion, and worsening coughing. He feels that his legs have become more swollen. No nausea or vomiting. He just finished prednisone  and a Z-Pak last week after calling his pulmonologist.     Assessment/Plan:     Acute on chronic hypoxic respiratory failure-at home he is on 2 L, has slightly higher oxygen  needs now at 3 L.  Has had wheezing on admission and was started on Solu-Medrol , nebulizers  Refusing Z-Pak as he says it gives him a headache-changed to Doxy    COPD exacerbation  -Patient has bilateral rhonchi on exam - Continue Solu-Medrol  40 mg IV every 12 hours -Continue DuoNebs every 6 hours -Continue doxycycline  - Hypertonic saline twice a day for 3 days  Musculoskeletal chest pain -Resolved - Complains of chest pain after echo was done - Says he was pushed too hard on chest wall which caused pain - Was given  1 dose of Toradol  15 mg IV, 1 dose of Robaxin  500 mg p.o.  Chronic diastolic CHF-most recent 2D echo in 2023 showed LVEF 55-60%, LVH, grade 1 diastolic dysfunction.  RV was normal.  There was mild hypokinesis of the left ventricular, basal inferior walls.- - Echo was not helpful as patient was uncooperative during exam - IV Lasix  discontinued  - Will restart Lasix  40 mg daily  Constipation - Will give 1 dose of Dulcolax 10 mg pr  Hyperkalemia - Potassium is 5.2 today - Will give 1 dose of Lokelma  5 g p.o. x 1 - Lasix  40 mg daily  restarted Follow potassium level in a.m.-   PAF-in sinus, continue metoprolol , Eliquis    Essential hypertension-on  metoprolol    Hypokalemia-replete   Hyperlipidemia-continue statin   CAD status post PCI-no chest pain, monitor   BPH-continue finasteride    Neuropathy-continue gabapentin     Medications     apixaban   5 mg Oral BID   dextromethorphan   15 mg Oral BID   doxycycline   100 mg Oral Q12H   feeding supplement  1 Bottle Oral BID BM   finasteride   5 mg Oral Daily   gabapentin   600 mg Oral QHS   guaiFENesin   600 mg Oral BID   ipratropium-albuterol   3 mL Nebulization Q4H   methylPREDNISolone  (SOLU-MEDROL ) injection  40 mg Intravenous Q12H   metoprolol  tartrate  25 mg Oral BID   multivitamin with minerals  1 tablet Oral Daily   pravastatin   20 mg Oral Daily   QUEtiapine   50 mg Oral QHS   simethicone   80 mg Oral QID   sodium chloride  HYPERTONIC  4 mL Nebulization BID     Data Reviewed:   CBG:  Recent Labs  Lab 10/14/23 2124  GLUCAP 147*    SpO2: 97 % O2 Flow Rate (L/min): 4 L/min FiO2 (%): 32 %    Vitals:   10/18/23 2042 10/18/23 2305 10/19/23 0304 10/19/23 0501  BP: (!) 166/85   135/74  Pulse: 78   66  Resp: 20   18  Temp: 97.9 F (36.6 C)   97.6 F (36.4 C)  TempSrc: Oral   Oral  SpO2: 96% 95% 93% 97%  Weight:    105.3 kg  Height:          Data Reviewed:  Basic Metabolic Panel: Recent Labs  Lab 10/13/23 0548 10/14/23 0519 10/15/23 0513 10/16/23 0507 10/17/23 0856 10/18/23 0503  NA 138 137 137 138 134* 133*  K 4.5 4.3 4.2 4.1 4.8 5.1  CL 96* 95* 93* 96* 94* 94*  CO2 32 33* 36* 33* 27 28  GLUCOSE 198* 135* 122* 138* 241* 174*  BUN 34* 46* 51* 59* 59* 59*  CREATININE 1.45* 1.49* 1.48* 1.47* 1.40* 1.37*  CALCIUM  8.4* 8.2* 8.8* 9.0 8.6* 9.0  MG 2.6* 2.5*  --   --   --   --     CBC: Recent Labs  Lab 10/12/23 2142 10/13/23 0725 10/14/23 0519 10/15/23 0513 10/16/23 0507  WBC 9.1 9.0 15.7* 11.8* 12.3*  NEUTROABS 7.8*  --    --   --   --   HGB 15.0 14.8 14.6 15.1 14.9  HCT 48.5 48.1 46.8 47.8 46.4  MCV 92.9 93.8 93.2 91.7 90.6  PLT 196 190 200 194 201    LFT Recent Labs  Lab 10/12/23 2142 10/14/23 0519  AST 20 14*  ALT 19 16  ALKPHOS 61 54  BILITOT 0.7 0.6  PROT 5.5* 5.1*  ALBUMIN 3.0* 2.7*     Antibiotics: Anti-infectives (From admission, onward)    Start     Dose/Rate Route Frequency Ordered Stop   10/15/23 0800  doxycycline  (VIBRA -TABS) tablet 100 mg        100 mg Oral Every 12 hours 10/15/23 0750     10/13/23 1000  azithromycin  (ZITHROMAX ) tablet 500 mg  Status:  Discontinued        500 mg Oral Daily 10/13/23 0304 10/14/23 1119        DVT prophylaxis: Eliquis   Code Status: DNR  Family Communication:    CONSULTS    Subjective   Patient seen, complains of shortness of breath and abdominal distention.  Has not had a good bowel movement in few days.   Objective    Physical Examination:  General-appears in no acute distress Heart-S1-S2, regular, no murmur auscultated Lungs-scattered rhonchi bilaterally. Abdomen-soft, nontender, no organomegaly Extremities-no edema in the lower extremities Neuro-alert, oriented x3, no focal deficit noted  Status is: Inpatient:        Sabas GORMAN Brod   [facility] If 7PM-7AM, please contact night-coverage at [email or web address], Office  [phone]   10/19/2023, 7:18 AM  LOS: 7 days

## 2023-10-20 DIAGNOSIS — J441 Chronic obstructive pulmonary disease with (acute) exacerbation: Secondary | ICD-10-CM | POA: Diagnosis not present

## 2023-10-20 DIAGNOSIS — I2510 Atherosclerotic heart disease of native coronary artery without angina pectoris: Secondary | ICD-10-CM

## 2023-10-20 DIAGNOSIS — K5900 Constipation, unspecified: Secondary | ICD-10-CM | POA: Diagnosis not present

## 2023-10-20 DIAGNOSIS — I10 Essential (primary) hypertension: Secondary | ICD-10-CM

## 2023-10-20 DIAGNOSIS — I480 Paroxysmal atrial fibrillation: Secondary | ICD-10-CM | POA: Diagnosis present

## 2023-10-20 DIAGNOSIS — E875 Hyperkalemia: Secondary | ICD-10-CM | POA: Diagnosis not present

## 2023-10-20 DIAGNOSIS — J9621 Acute and chronic respiratory failure with hypoxia: Secondary | ICD-10-CM | POA: Diagnosis not present

## 2023-10-20 DIAGNOSIS — N400 Enlarged prostate without lower urinary tract symptoms: Secondary | ICD-10-CM

## 2023-10-20 DIAGNOSIS — E785 Hyperlipidemia, unspecified: Secondary | ICD-10-CM

## 2023-10-20 LAB — BASIC METABOLIC PANEL WITH GFR
Anion gap: 11 (ref 5–15)
BUN: 57 mg/dL — ABNORMAL HIGH (ref 8–23)
CO2: 27 mmol/L (ref 22–32)
Calcium: 8.7 mg/dL — ABNORMAL LOW (ref 8.9–10.3)
Chloride: 98 mmol/L (ref 98–111)
Creatinine, Ser: 1.23 mg/dL (ref 0.61–1.24)
GFR, Estimated: 58 mL/min — ABNORMAL LOW (ref >60)
Glucose, Bld: 184 mg/dL — ABNORMAL HIGH (ref 70–99)
Potassium: 5.2 mmol/L — ABNORMAL HIGH (ref 3.5–5.1)
Sodium: 136 mmol/L (ref 135–145)

## 2023-10-20 MED ORDER — PANTOPRAZOLE SODIUM 40 MG PO TBEC
40.0000 mg | DELAYED_RELEASE_TABLET | Freq: Every day | ORAL | Status: DC
  Administered 2023-10-20 – 2023-10-25 (×6): 40 mg via ORAL
  Filled 2023-10-20 (×6): qty 1

## 2023-10-20 MED ORDER — BUDESONIDE 0.5 MG/2ML IN SUSP
0.5000 mg | Freq: Two times a day (BID) | RESPIRATORY_TRACT | Status: DC
  Administered 2023-10-20 – 2023-10-25 (×11): 0.5 mg via RESPIRATORY_TRACT
  Filled 2023-10-20 (×11): qty 2

## 2023-10-20 MED ORDER — SODIUM ZIRCONIUM CYCLOSILICATE 5 G PO PACK
5.0000 g | PACK | Freq: Two times a day (BID) | ORAL | Status: AC
  Administered 2023-10-20 (×2): 5 g via ORAL
  Filled 2023-10-20 (×2): qty 1

## 2023-10-20 NOTE — Progress Notes (Addendum)
 PROGRESS NOTE    DYSON Mitchell [name]  FMW:[id number] DOB: 07-Nov-1940 DOA: 10/12/2023 PCP: Okey Carlin Redbird, MD   Chief Complaint  Patient presents with   Shortness of Breath    Brief Narrative:  83 year old male with history of COPD on 2 L at home, PAF on Eliquis , chronic hypoxia with 2 L at home, CAD status post PCI, CKD 3A, legally blind, comes into the hospital with shortness of breath and fluid retention. He tells me that ever since she was discharged a month ago, continue to gain weight, and is up at least 20 pounds of water since then. He has had pretty significant dyspnea on exertion, and worsening coughing. He feels that his legs have become more swollen. No nausea or vomiting. He just finished prednisone  and a Z-Pak last week after calling his pulmonologist.    Assessment & Plan:   Principal Problem:   Acute on chronic respiratory failure with hypoxia (HCC) Active Problems:   Hyperlipidemia   Hypertension   Hypoxia   Coronary artery disease involving native heart without angina pectoris   Acute on chronic diastolic (congestive) heart failure (HCC)   Hyperkalemia   Constipation   PAF (paroxysmal atrial fibrillation) (HCC)  #1 acute on chronic hypoxic respiratory failure - Patient chronically on 2 L home O2 with slightly higher O2 needs at 3 L during this hospitalization. - Likely secondary to acute COPD exacerbation and possible acute on chronic diastolic CHF. - Patient with clinical improvement, wheezing has improved, patient with some gurgling noted on examination. - Continue treatment for acute COPD exacerbation and CHF. - Patient transition to oral Lasix . - Follow-up.  2.  Acute COPD exacerbation -Patient on presentation presented with wheezing On admission and placed on IV Solu-Medrol  and nebs treatment. - Wheezing improving. - Continue Delsym , Mucinex , scheduled DuoNebs, Claritin , hypertonic saline nebs, IV Solu-Medrol  40 mg every 12 hours and likely transition  to 40 mg daily tomorrow. - Continue doxycycline . -Start Pulmicort . - Will need outpatient follow-up with primary pulmonologist on discharge.  3.  Acute on chronic diastolic CHF exacerbation -Patient with 2D echo in 2023 with EF of 55 to 60%, LVH, grade 1 diastolic dysfunction, RV normal, mild hypokinesis of left ventricular basal inferior walls. - 2D echo noted to be not as helpful as patient noted to be uncooperative during examination. -Patient noted to be -8.966 L during this hospitalization. - Was on IV Lasix  and transitioned to oral Lasix  40 mg daily. - Patient noted to have some slight gurgling on examination.  Will monitor for now. - Strict I's and O's, daily weights.  4.  Musculoskeletal chest pain resolved -Patient noted to have some chest pain after echo was done and fuse ultrasound was pushed too hard on his chest wall which caused pain. - Status post IV Toradol  and Robaxin .  5.  Constipation -Noted to have received a Dulcolax suppository on 10/19/2023. - Currently on MiraLAX  daily and having good results.  6.  Hyperkalemia -Potassium of 5.2 today. - Lokelma  5 mg p.o. twice daily x 1 day. - Patient on diuretics.  7.  Paroxysmal atrial fibrillation -Currently normal sinus rhythm. - Continue Lopressor  for rate control. - Eliquis  for anticoagulation.  8.  Hypertension -Continue current regimen of metoprolol .  9.  Hyperlipidemia -Continue statin.  10.  CAD status post PCI -Stable.  11.  BPH -Finasteride .  12.  Neuropathy -Gabapentin .    DVT prophylaxis: Eliquis  Code Status: DNR Family Communication: Updated patient, wife, sons, daughter-in-law at bedside. Disposition: Home with  home health when clinically improved.  Status is: Inpatient Remains inpatient appropriate because: Severity of illness   Consultants:  None  Procedures:  Chest x-ray 10/12/2023, 10/19/2023 2D echo 10/13/2023   Antimicrobials:  Anti-infectives (From admission, onward)    Start      Dose/Rate Route Frequency Ordered Stop   10/15/23 0800  doxycycline  (VIBRA -TABS) tablet 100 mg        100 mg Oral Every 12 hours 10/15/23 0750     10/13/23 1000  azithromycin  (ZITHROMAX ) tablet 500 mg  Status:  Discontinued        500 mg Oral Daily 10/13/23 0304 10/14/23 1119         Subjective: Patient sitting up in recliner.  Sons, wife, daughter-in-law at bedside.  Patient with complaints of not feeling too well today.  Patient states he is gaining weight since he has been in the hospital.  Patient states shortness of breath when laying flat.  Patient stated had good bowel movements yesterday.  Objective: Vitals:   10/20/23 0701 10/20/23 1142 10/20/23 1233 10/20/23 1550  BP:   (!) 161/79   Pulse:   60   Resp:   16 14  Temp:   (!) 97.5 F (36.4 C)   TempSrc:   Oral   SpO2:  97% 99% 96%  Weight: 105.3 kg     Height:        Intake/Output Summary (Last 24 hours) at 10/20/2023 1633 Last data filed at 10/20/2023 1300 Gross per 24 hour  Intake 720 ml  Output 900 ml  Net -180 ml   Filed Weights   10/17/23 0500 10/19/23 0501 10/20/23 0701  Weight: 101.6 kg 105.3 kg 105.3 kg    Examination:  General exam: Appears calm and comfortable  Respiratory system: Decreased breath sounds in the bases.  Minimal expiratory wheezing.  Some gurgling noted.  Some coarse breath sounds.  Cardiovascular system: S1 & S2 heard, RRR. No JVD, murmurs, rubs, gallops or clicks. No pedal edema. Gastrointestinal system: Abdomen is obese, nondistended, soft and nontender. No organomegaly or masses felt. Normal bowel sounds heard. Central nervous system: Alert and oriented. No focal neurological deficits. Extremities: Symmetric 5 x 5 power. Skin: No rashes, lesions or ulcers Psychiatry: Judgement and insight appear normal. Mood & affect appropriate.     Data Reviewed: I have personally reviewed following labs and imaging studies  CBC: Recent Labs  Lab 10/14/23 0519 10/15/23 0513 10/16/23 0507   WBC 15.7* 11.8* 12.3*  HGB 14.6 15.1 14.9  HCT 46.8 47.8 46.4  MCV 93.2 91.7 90.6  PLT 200 194 201    Basic Metabolic Panel: Recent Labs  Lab 10/14/23 0519 10/15/23 0513 10/16/23 0507 10/17/23 0856 10/18/23 0503 10/19/23 0945 10/20/23 0443  NA 137   < > 138 134* 133* 135 136  K 4.3   < > 4.1 4.8 5.1 5.2* 5.2*  CL 95*   < > 96* 94* 94* 94* 98  CO2 33*   < > 33* 27 28 28 27   GLUCOSE 135*   < > 138* 241* 174* 233* 184*  BUN 46*   < > 59* 59* 59* 58* 57*  CREATININE 1.49*   < > 1.47* 1.40* 1.37* 1.35* 1.23  CALCIUM  8.2*   < > 9.0 8.6* 9.0 9.0 8.7*  MG 2.5*  --   --   --   --   --   --    < > = values in this interval not displayed.    GFR:  Estimated Creatinine Clearance: 57.1 mL/min (by C-G formula based on SCr of 1.23 mg/dL).  Liver Function Tests: Recent Labs  Lab 10/14/23 0519  AST 14*  ALT 16  ALKPHOS 54  BILITOT 0.6  PROT 5.1*  ALBUMIN 2.7*    CBG: Recent Labs  Lab 10/14/23 2124  GLUCAP 147*     No results found for this or any previous visit (from the past 240 hours).       Radiology Studies: DG Chest Port 1V same Day Result Date: 10/19/2023 CLINICAL DATA:  Shortness of breath and dyspnea. EXAM: PORTABLE CHEST 1 VIEW COMPARISON:  10/12/2023 FINDINGS: Heart size and mediastinal contours are unremarkable. There is no pleural fluid, interstitial edema or airspace disease. The visualized osseous structures are unremarkable. IMPRESSION: No active disease. Electronically Signed   By: Waddell Calk M.D.   On: 10/19/2023 14:03        Scheduled Meds:  apixaban   5 mg Oral BID   budesonide  (PULMICORT ) nebulizer solution  0.5 mg Nebulization BID   dextromethorphan   15 mg Oral BID   doxycycline   100 mg Oral Q12H   feeding supplement  1 Bottle Oral BID BM   finasteride   5 mg Oral Daily   furosemide   40 mg Oral Daily   gabapentin   600 mg Oral QHS   guaiFENesin   600 mg Oral BID   ipratropium-albuterol   3 mL Nebulization Q4H   loratadine   10 mg Oral  Daily   methylPREDNISolone  (SOLU-MEDROL ) injection  40 mg Intravenous Q12H   metoprolol  tartrate  25 mg Oral BID   multivitamin with minerals  1 tablet Oral Daily   pantoprazole   40 mg Oral Q0600   polyethylene glycol  17 g Oral Daily   pravastatin   20 mg Oral Daily   QUEtiapine   50 mg Oral QHS   sodium chloride  HYPERTONIC  4 mL Nebulization BID   sodium zirconium cyclosilicate   5 g Oral BID   Continuous Infusions:   LOS: 8 days    Time spent: 40 minutes    Toribio Hummer, MD [facility]   To contact the attending provider between 7A-7P or the covering provider during after hours 7P-7A, please log into the web site [email or web address] and access using universal [place] password for that web site. If you do not have the password, please call the hospital operator.  10/20/2023, 4:33 PM

## 2023-10-20 NOTE — Progress Notes (Signed)
 Occupational Therapy Treatment Patient Details Name: Mitchell [name] MRN: [id number] DOB: 05-Oct-1940 Today's Date: 10/20/2023   History of present illness Mitchell [name] is a 83 y.o. admitted to [facility] on 10/12/23 with dx of  Acute on chronic diastolic CHF and respiratory failure with hypoxia, with possible COPD exacerbation. PMH significant for COPD on 2-3 L O2 home oxygen , CAD, chronic diastolic CHF (last echo 12/2016), Hx NSTEMI.   OT comments  Do not feel pt is safe to go home alone and manage.  Recommend pt go to sons home or go to ALF or hire care at home      If plan is discharge home, recommend the following:  A little help with walking and/or transfers;A lot of help with bathing/dressing/bathroom;Assistance with cooking/housework;Direct supervision/assist for medications management;Direct supervision/assist for financial management;Assist for transportation;Help with stairs or ramp for entrance         Precautions / Restrictions Precautions Precautions: Fall Precaution/Restrictions Comments: Home oxygen  2 lts       Mobility Bed Mobility               General bed mobility comments: pt in chair    Transfers Overall transfer level: Needs assistance Equipment used: Rollator (4 wheels) Transfers: Sit to/from Stand Sit to Stand: Contact guard assist, Min assist                 Balance Overall balance assessment: Needs assistance Sitting-balance support: No upper extremity supported Sitting balance-Leahy Scale: Good     Standing balance support: Bilateral upper extremity supported, Reliant on assistive device for balance Standing balance-Leahy Scale: Poor                             ADL either performed or assessed with clinical judgement   ADL        Pt will need A with bathing/ dressing/ peri care as well as IADL activity upon DC home.                                   General ADL Comments: Discussion about need for care at home  and safety.  Pt needs increased care at home due to needing A with IADL activity as well as ADL activity.  2 sons present as well as ex wife.  Son states she has an extra bedroom but pt will not leave his home.  Also mentioned options of ALF. Son stated wouldnt go and if he was ever at a place like that and wasnt take care of 'it wouldnt be good  Spoke with other son about family being a unified front in commnicating with the patient .  OT communicated with  RN and MD.    Extremity/Trunk Assessment Upper Extremity Assessment Upper Extremity Assessment: Overall WFL for tasks assessed            Vision Baseline Vision/History: 2 Legally blind               Cognition Arousal: Alert Behavior During Therapy: WFL for tasks assessed/performed Cognition: No apparent impairments             OT - Cognition Comments: decreased awareness of need for A at home  Pertinent Vitals/ Pain       Pain Assessment Faces Pain Scale: No hurt         Frequency  Min 2X/week        Progress Toward Goals  OT Goals(current goals can now be found in the care plan section)     Acute Rehab OT Goals Patient Stated Goal: to get well and go to beach Time For Goal Achievement: 10/28/23 Potential to Achieve Goals: Good  Plan         AM-PAC OT 6 Clicks Daily Activity     Outcome Measure   Help from another person eating meals?: A Little Help from another person taking care of personal grooming?: A Little Help from another person toileting, which includes using toliet, bedpan, or urinal?: A Little Help from another person bathing (including washing, rinsing, drying)?: A Little Help from another person to put on and taking off regular upper body clothing?: A Lot Help from another person to put on and taking off regular lower body clothing?: A Little 6 Click Score: 17    End of Session Equipment Utilized During Treatment: Rolling  walker (2 wheels);Oxygen   OT Visit Diagnosis: Unsteadiness on feet (R26.81);Muscle weakness (generalized) (M62.81);Low vision, both eyes (H54.2);Pain   Activity Tolerance Patient tolerated treatment well   Patient Left in chair;with call bell/phone within reach;with chair alarm set;with family/visitor present   Nurse Communication Mobility status;Other (comment)        Time: 8962-8881 OT Time Calculation (min): 41 min  Charges: OT Treatments $Self Care/Home Management : 38-52 mins   Mitchell [name] D 10/20/2023, 1:09 PM

## 2023-10-20 NOTE — Plan of Care (Signed)

## 2023-10-20 NOTE — Progress Notes (Signed)
 Pt ambulated three times in hallway with assistance (FWW) and O2. Three bowel movements today, continent. Bathed. Educated patient about balancing fluid and oral intake to combat increases in weight, fluid retention. Patient somewhat receptive, but can be difficult with requesting more food, sugars and drinks. Thank you OT for assistance with this education.

## 2023-10-21 DIAGNOSIS — J9621 Acute and chronic respiratory failure with hypoxia: Secondary | ICD-10-CM | POA: Diagnosis not present

## 2023-10-21 DIAGNOSIS — K5900 Constipation, unspecified: Secondary | ICD-10-CM | POA: Diagnosis not present

## 2023-10-21 DIAGNOSIS — E875 Hyperkalemia: Secondary | ICD-10-CM | POA: Diagnosis not present

## 2023-10-21 DIAGNOSIS — J441 Chronic obstructive pulmonary disease with (acute) exacerbation: Secondary | ICD-10-CM | POA: Diagnosis not present

## 2023-10-21 LAB — CBC WITH DIFFERENTIAL/PLATELET
Abs Immature Granulocytes: 1.16 K/uL — ABNORMAL HIGH (ref 0.00–0.07)
Basophils Absolute: 0.1 K/uL (ref 0.0–0.1)
Basophils Relative: 1 %
Eosinophils Absolute: 0 K/uL (ref 0.0–0.5)
Eosinophils Relative: 0 %
HCT: 46.3 % (ref 39.0–52.0)
Hemoglobin: 14.9 g/dL (ref 13.0–17.0)
Immature Granulocytes: 7 %
Lymphocytes Relative: 4 %
Lymphs Abs: 0.7 K/uL (ref 0.7–4.0)
MCH: 28.7 pg (ref 26.0–34.0)
MCHC: 32.2 g/dL (ref 30.0–36.0)
MCV: 89.2 fL (ref 80.0–100.0)
Monocytes Absolute: 0.9 K/uL (ref 0.1–1.0)
Monocytes Relative: 6 %
Neutro Abs: 13.4 K/uL — ABNORMAL HIGH (ref 1.7–7.7)
Neutrophils Relative %: 82 %
Platelets: 187 K/uL (ref 150–400)
RBC: 5.19 MIL/uL (ref 4.22–5.81)
RDW: 15.3 % (ref 11.5–15.5)
WBC: 16.4 K/uL — ABNORMAL HIGH (ref 4.0–10.5)
nRBC: 0 % (ref 0.0–0.2)

## 2023-10-21 LAB — BASIC METABOLIC PANEL WITH GFR
Anion gap: 11 (ref 5–15)
BUN: 57 mg/dL — ABNORMAL HIGH (ref 8–23)
CO2: 28 mmol/L (ref 22–32)
Calcium: 8.5 mg/dL — ABNORMAL LOW (ref 8.9–10.3)
Chloride: 97 mmol/L — ABNORMAL LOW (ref 98–111)
Creatinine, Ser: 1.24 mg/dL (ref 0.61–1.24)
GFR, Estimated: 58 mL/min — ABNORMAL LOW (ref >60)
Glucose, Bld: 169 mg/dL — ABNORMAL HIGH (ref 70–99)
Potassium: 5 mmol/L (ref 3.5–5.1)
Sodium: 136 mmol/L (ref 135–145)

## 2023-10-21 MED ORDER — COLCHICINE 0.6 MG PO TABS
0.6000 mg | ORAL_TABLET | Freq: Two times a day (BID) | ORAL | Status: DC
  Administered 2023-10-21 – 2023-10-25 (×9): 0.6 mg via ORAL
  Filled 2023-10-21 (×9): qty 1

## 2023-10-21 MED ORDER — SODIUM ZIRCONIUM CYCLOSILICATE 5 G PO PACK
5.0000 g | PACK | Freq: Once | ORAL | Status: AC
  Administered 2023-10-21: 5 g via ORAL
  Filled 2023-10-21: qty 1

## 2023-10-21 MED ORDER — GUAIFENESIN-DM 100-10 MG/5ML PO SYRP
5.0000 mL | ORAL_SOLUTION | ORAL | Status: DC | PRN
  Administered 2023-10-21: 5 mL via ORAL
  Filled 2023-10-21: qty 10

## 2023-10-21 NOTE — Discharge Instructions (Addendum)
 Low Sodium Nutrition Therapy  Eating less sodium can help you if you have high blood pressure, heart failure, or kidney or liver disease.   Your body needs a little sodium, but too much sodium can cause your body to hold onto extra water. This extra water will raise your blood pressure and can cause damage to your heart, kidneys, or liver as they are forced to work harder.   Sometimes you can see how the extra fluid affects you because your hands, legs, or belly swell. You may also hold water around your heart and lungs, which makes it hard to breathe.   Even if you take medication for blood pressure or a water pill (diuretic) to remove fluid, it is still important to have less salt in your diet.   Check with your primary care provider before drinking alcohol  since it may affect the amount of fluid in your body and how your heart, kidneys, or liver work. Sodium in Food A low-sodium meal plan limits the sodium that you get from food and beverages to 1,500-2,000 milligrams (mg) per day. Salt is the main source of sodium. Read the nutrition label on the package to find out how much sodium is in one serving of a food.  Select foods with 140 milligrams (mg) of sodium or less per serving.  You may be able to eat one or two servings of foods with a little more than 140 milligrams (mg) of sodium if you are closely watching how much sodium you eat in a day.  Check the serving size on the label. The amount of sodium listed on the label shows the amount in one serving of the food. So, if you eat more than one serving, you will get more sodium than the amount listed.  Tips Cutting Back on Sodium Eat more fresh foods.  Fresh fruits and vegetables are low in sodium, as well as frozen vegetables and fruits that have no added juices or sauces.  Fresh meats are lower in sodium than processed meats, such as bacon, sausage, and hotdogs.  Not all processed foods are unhealthy, but some processed foods may have too  much sodium.  Eat less salt at the table and when cooking. One of the ingredients in salt is sodium.  One teaspoon of table salt has 2,300 milligrams of sodium.  Leave the salt out of recipes for pasta, casseroles, and soups. Be a Engineer, building services.  Food packages that say "Salt-free", sodium-free", "very low sodium," and "low sodium" have less than 140 milligrams of sodium per serving.  Beware of products identified as "Unsalted," "No Salt Added," "Reduced Sodium," or "Lower Sodium." These items may still be high in sodium. You should always check the nutrition label. Add flavors to your food without adding sodium.  Try lemon juice, lime juice, or vinegar.  Dry or fresh herbs add flavor.  Buy a sodium-free seasoning blend or make your own at home. You can purchase salt-free or sodium-free condiments like barbeque sauce in stores and online. Ask your registered dietitian nutritionist for recommendations and where to find them.   Eating in Restaurants Choose foods carefully when you eat outside your home. Restaurant foods can be very high in sodium. Many restaurants provide nutrition facts on their menus or their websites. If you cannot find that information, ask your server. Let your server know that you want your food to be cooked without salt and that you would like your salad dressing and sauces to be served on the  side.    Foods Recommended Food Group Foods Recommended  Grains Bread, bagels, rolls without salted tops Homemade bread made with reduced-sodium baking powder Cold cereals, especially shredded wheat and puffed rice Oats, grits, or cream of wheat Pastas, quinoa, and rice Popcorn, pretzels or crackers without salt Corn tortillas  Protein Foods Fresh meats and fish; [place] bacon (check the nutrition labels - make sure they are not packaged in a sodium solution) Canned or packed tuna (no more than 4 ounces at 1 serving) Beans and peas Soybeans) and tofu Eggs Nuts or nut butters  without salt  Dairy Milk or milk powder Plant milks, such as rice and soy Yogurt, including Greek yogurt Small amounts of natural cheese (blocks of cheese) or reduced-sodium cheese can be used in moderation. (Swiss, ricotta, and fresh mozzarella cheese are lower in sodium than the others) Cream Cheese Low sodium cottage cheese  Vegetables Fresh and frozen vegetables without added sauces or salt Homemade soups (without salt) Low-sodium, salt-free or sodium-free canned vegetables and soups  Fruit Fresh and canned fruits Dried fruits, such as raisins, cranberries, and prunes  Oils Tub or liquid margarine, regular or without salt Canola, corn, peanut, olive, safflower, or sunflower oils  Condiments Fresh or dried herbs such as basil, bay leaf, dill, mustard (dry), nutmeg, paprika, parsley, rosemary, sage, or thyme.  Low sodium ketchup Vinegar  Lemon or lime juice Pepper, red pepper flakes, and cayenne. Hot sauce contains sodium, but if you use just a drop or two, it will not add up to much.  Salt-free or sodium-free seasoning mixes and marinades Simple salad dressings: vinegar and oil   Foods Not Recommended Food Group Foods Not Recommended  Grains Breads or crackers topped with salt Cereals (hot/cold) with more than 300 mg sodium per serving Biscuits, cornbread, and other "quick" breads prepared with baking soda Pre-packaged bread crumbs Seasoned and packaged rice and pasta mixes Self-rising flours  Protein Foods Cured meats: Bacon, ham, sausage, pepperoni and hot dogs Canned meats (chili, vienna sausage, or sardines) Smoked fish and meats Frozen meals that have more than 600 mg of sodium per serving Egg substitute (with added sodium)  Dairy Buttermilk Processed cheese spreads Cottage cheese (1 cup may have over 500 mg of sodium; look for low-sodium.) American or feta cheese Shredded Cheese has more sodium than blocks of cheese String cheese  Vegetables Canned vegetables  (unless they are salt-free, sodium-free or low sodium) Frozen vegetables with seasoning and sauces Sauerkraut and pickled vegetables Canned or dried soups (unless they are salt-free, sodium-free, or low sodium) [place] fries and onion rings  Fruit Dried fruits preserved with additives that have sodium  Oils Salted butter or margarine, all types of olives  Condiments Salt, sea salt, kosher salt, onion salt, and garlic salt Seasoning mixes with salt Bouillon cubes Ketchup Barbeque sauce and Worcestershire sauce unless low sodium Soy sauce Salsa, pickles, olives, relish Salad dressings: ranch, blue cheese, [place], and [place].   Low Sodium Sample 1-Day Menu  Breakfast 1 cup cooked oatmeal  1 slice whole wheat bread toast  1 tablespoon peanut butter without salt  1 banana  1 cup 1% milk  Lunch Tacos made with: 2 corn tortillas   cup black beans, low sodium   cup roasted or grilled chicken (without skin)   avocado  Squeeze of lime juice  1 cup salad greens  1 tablespoon low-sodium salad dressing   cup strawberries  1 orange  Afternoon Snack 1/3 cup grapes  6 ounces yogurt  Evening Meal 3 ounces herb-baked fish  1 baked potato  2 teaspoons olive oil   cup cooked carrots  2 thick slices tomatoes on:  2 lettuce leaves  1 teaspoon olive oil  1 teaspoon balsamic vinegar  1 cup 1% milk  Evening Snack 1 apple   cup almonds without salt   Low-Sodium Vegetarian (Lacto-Ovo) Sample 1-Day Menu  Breakfast 1 cup cooked oatmeal  1 slice whole wheat toast  1 tablespoon peanut butter without salt  1 banana  1 cup 1% milk  Lunch Tacos made with: 2 corn tortillas   cup black beans, low sodium   cup roasted or grilled chicken (without skin)   avocado  Squeeze of lime juice  1 cup salad greens  1 tablespoon low-sodium salad dressing   cup strawberries  1 orange  Evening Meal Stir fry made with:  cup tofu  1 cup brown rice   cup broccoli   cup green beans   cup  peppers   tablespoon peanut oil  1 orange  1 cup 1% milk  Evening Snack 4 strips celery  2 tablespoons hummus  1 hard-boiled egg   Low-Sodium Vegan Sample 1-Day Menu  Breakfast 1 cup cooked oatmeal  1 tablespoon peanut butter without salt  1 cup blueberries  1 cup soymilk fortified with calcium , vitamin B12, and vitamin D  Lunch 1 small whole wheat pita   cup cooked lentils  2 tablespoons hummus  4 carrot sticks  1 medium apple  1 cup soymilk fortified with calcium , vitamin B12, and vitamin D  Evening Meal Stir fry made with:  cup tofu  1 cup brown rice   cup broccoli   cup green beans   cup peppers   tablespoon peanut oil  1 cup cantaloupe  Evening Snack 1 cup soy yogurt   cup mixed nuts  Copyright 2020  Academy of Nutrition and Dietetics. All rights reserved  Sodium Free Flavoring Tips  When cooking, the following items may be used for flavoring instead of salt or seasonings that contain sodium. Remember: A little bit of spice goes a long way! Be careful not to overseason. Spice Blend Recipe (makes about ? cup) 5 teaspoons onion powder  2 teaspoons garlic powder  2 teaspoons paprika  2 teaspoon dry mustard  1 teaspoon crushed thyme leaves   teaspoon white pepper   teaspoon celery seed Food Item Flavorings  Beef Basil, bay leaf, caraway, curry, dill, dry mustard, garlic, grape jelly, green pepper, mace, marjoram, mushrooms (fresh), nutmeg, onion or onion powder, parsley, pepper, rosemary, sage  Chicken Basil, cloves, cranberries, mace, mushrooms (fresh), nutmeg, oregano, paprika, parsley, pineapple, saffron, sage, savory, tarragon, thyme, tomato, turmeric  Egg Chervil, curry, dill, dry mustard, garlic or garlic powder, green pepper, jelly, mushrooms (fresh), nutmeg, onion powder, paprika, parsley, rosemary, tarragon, tomato  Fish Basil, bay leaf, chervil, curry, dill, dry mustard, green pepper, lemon juice, marjoram, mushrooms (fresh), paprika, pepper,  tarragon, tomato, turmeric  Lamb Cloves, curry, dill, garlic or garlic powder, mace, mint, mint jelly, onion, oregano, parsley, pineapple, rosemary, tarragon, thyme  Pork Applesauce, basil, caraway, chives, cloves, garlic or garlic powder, onion or onion powder, rosemary, thyme  Veal Apricots, basil, bay leaf, currant jelly, curry, ginger, marjoram, mushrooms (fresh), oregano, paprika  Vegetables Basil, dill, garlic or garlic powder, ginger, lemon juice, mace, marjoram, nutmeg, onion or onion powder, tarragon, tomato, sugar or sugar substitute, salt-free salad dressing, vinegar  Desserts Allspice, anise, cinnamon, cloves, ginger, mace, nutmeg, vanilla extract, other  extracts   Copyright 2020  Academy of Nutrition and Dietetics. All rights reserved  Fluid Restricted Nutrition Therapy  You have been prescribed this diet because your condition affects how much fluid you can eat or drink. If your heart, liver, or kidneys aren't working properly, you may not be able to effectively eliminate fluids from the body and this may cause swelling (edema) in the legs, arms, and/or stomach. Drink no more than _________ liters or ________ ounces or ________cups of fluid per day.  You don't need to stop eating or drinking the same fluids you normally would, but you may need to eat or drink less than usual.  Your registered dietitian nutritionist will help you determine the correct amount of fluid to consume during the day Breakfast Include fluids taken with medications  Lunch Include fluids taken with medications  Dinner Include fluids taken with medications  Bedtime Snack Include fluids taken with medications     Tips What Are Fluids?  A fluid is anything that is liquid or anything that would melt if left at room temperature. You will need to count these foods and liquids--including any liquid used to take medication--as part of your daily fluid intake. Some examples are: Alcohol  (drink only with your  doctor's permission)  Coffee, tea, and other hot beverages  Gelatin (Jell-O)  Gravy  Ice cream, sherbet, sorbet  Ice cubes, ice chips  Milk, liquid creamer  Nutritional supplements  Popsicles  Vegetable and fruit juices; fluid in canned fruit  Watermelon  Yogurt  Soft drinks, lemonade, limeade  Soups  Syrup How Do I Measure My Fluid Intake? Record your fluid intake daily.  Tip: Every day, each time you eat or drink fluids, pour water in the same amount into an empty container that can hold the same amount of fluids you are allowed daily. This may help you keep track of how much fluid you are taking in throughout the day.  To accurately keep track of how much liquid you take in, measure the size of the cups, glasses, and bowls you use. If you eat soup, measure how much of it is liquid and how much is solid (such as noodles, vegetables, meat). Conversions for Measuring Fluid Intake  Milliliters (mL) Liters (L) Ounces (oz) Cups (c)  1000 1 32 4  1200 1.2 40 5  1500 1.5 50 6 1/4  1800 1.8 60 7 1/2  2000 2 67 8 1/3  Tips to Reduce Your Thirst Chew gum or suck on hard candy.  Rinse or gargle with mouthwash. Do not swallow.  Ice chips or popsicles my help quench thirst, but this too needs to be calculated into the total restriction. Melt ice chips or cubes first to figure out how much fluid they produce (for example, experiment with melting  cup ice chips or 2 ice cubes).  Add a lemon wedge to your water.  Limit how much salt you take in. A high salt intake might make you thirstier.  Don't eat or drink all your allowed liquids at once. Space your liquids out through the day.  Use small glasses and cups and sip slowly. If allowed, take your medications with fluids you eat or drink during a meal.   Fluid-Restricted Nutrition Therapy Sample 1-Day Menu  Breakfast 1 slice wheat toast  1 tablespoon peanut butter  1/2 cup yogurt (120 milliliters)  1/2 cup blueberries  1 cup milk (240  milliliters)   Lunch 3 ounces sliced [place]  2 slices whole wheat  bread  1/2 cup lettuce for sandwich  2 slices tomato for sandwich  1 ounce reduced-fat, reduced-sodium cheese  1/2 cup fresh carrot sticks  1 banana  1 cup unsweetened tea (240 milliliters)   Evening Meal 8 ounces soup (240 milliliters)  3 ounces salmon  1/2 cup quinoa  1 cup green beans  1 cup mixed greens salad  1 tablespoon olive oil  1 cup coffee (240 milliliters)  Evening Snack 1/2 cup sliced peaches  1/2 cup frozen yogurt (120 milliliters)  1 cup water (240 milliliters)  Copyright 2020  Academy of Nutrition and Dietetics. All rights reserved   [facility] Contact:Shanda Davis(social worker for the blind in [place] Tel#[phone].  Main office: Address: [address] [place], [place], [place] [address] Phone: [phone] Hours:  Open ? Closes 5?PM

## 2023-10-21 NOTE — Progress Notes (Signed)
 PROGRESS NOTE    Mitchell [name]  FMW:[id number] DOB: February 12, 1941 DOA: 10/12/2023 PCP: Okey Carlin Redbird, MD   Chief Complaint  Patient presents with   Shortness of Breath    Brief Narrative:  83 year old male with history of COPD on 2 L at home, PAF on Eliquis , chronic hypoxia with 2 L at home, CAD status post PCI, CKD 3A, legally blind, comes into the hospital with shortness of breath and fluid retention. He tells me that ever since she was discharged a month ago, continue to gain weight, and is up at least 20 pounds of water since then. He has had pretty significant dyspnea on exertion, and worsening coughing. He feels that his legs have become more swollen. No nausea or vomiting. He just finished prednisone  and a Z-Pak last week after calling his pulmonologist.    Assessment & Plan:   Principal Problem:   Acute on chronic respiratory failure with hypoxia (HCC) Active Problems:   Hyperlipidemia   Hypertension   Hypoxia   Coronary artery disease involving native heart without angina pectoris   Gout flare   Acute on chronic diastolic (congestive) heart failure (HCC)   Hyperkalemia   Constipation   PAF (paroxysmal atrial fibrillation) (HCC)  #1 acute on chronic hypoxic respiratory failure - Patient chronically on 2 L home O2 with slightly higher O2 needs at 3 L during this hospitalization. - Likely secondary to acute COPD exacerbation and possible acute on chronic diastolic CHF. - Patient with clinical improvement, wheezing has improved. - Continue treatment for acute COPD exacerbation and CHF. - Patient transitioned to oral Lasix . - Follow-up.  2.  Acute COPD exacerbation -Patient on presentation presented with wheezing On admission and placed on IV Solu-Medrol  and nebs treatment. - Wheezing improving. - Continue Delsym , Mucinex , scheduled DuoNebs, Claritin , hypertonic saline nebs. - Taper IV Solu-Medrol  to 40 mg daily likely transition to oral prednisone  tomorrow  pending clinical improvement. - Continue Pulmicort , doxycycline . - Will need outpatient follow-up with primary pulmonologist on discharge.  3.  Acute on chronic diastolic CHF exacerbation -Patient with 2D echo in 2023 with EF of 55 to 60%, LVH, grade 1 diastolic dysfunction, RV normal, mild hypokinesis of left ventricular basal inferior walls. - 2D echo noted to be not as helpful as patient noted to be uncooperative during examination. -Patient with urine output of 2.3 L over the past 24 hours. -Patient noted to be -10.7 L during this hospitalization. - Was on IV Lasix  and transitioned to oral Lasix  40 mg daily. - Strict I's and O's, daily weights.  4.  Musculoskeletal chest pain resolved -Patient noted to have some chest pain after echo was done and feels ultrasound probe was pushed too hard on his chest wall which caused pain. - Status post IV Toradol  and Robaxin . - Improved clinically.  5.  Constipation -Noted to have received a Dulcolax suppository on 10/19/2023. - Currently on MiraLAX  daily and having good results.  6.  Hyperkalemia -Potassium of 5.0.   - Repeat labs in the AM.  7.  Paroxysmal atrial fibrillation -Currently normal sinus rhythm. - Continue Lopressor  for rate control. - Eliquis  for anticoagulation.  8.  Hypertension - Metoprolol .   9.  Hyperlipidemia - Statin.   10.  CAD status post PCI -Stable.  11.  BPH - Continue finasteride .  12.  Neuropathy - Continue gabapentin .  13.  Acute gouty attack - Patient with some complaints of right foot pain. - Right great toe exquisitely tender to palpation. - Patient with  history of gout. - Patient currently on IV Solu-Medrol . - Start colchicine  0.6 mg twice daily.     DVT prophylaxis: Eliquis  Code Status: DNR Family Communication: Updated patient, and wife at bedside.  Disposition: Home with home health when clinically improved hopefully in the next 24 to 48 hours.  Status is: Inpatient Remains  inpatient appropriate because: Severity of illness   Consultants:  None  Procedures:  Chest x-ray 10/12/2023, 10/19/2023 2D echo 10/13/2023   Antimicrobials:  Anti-infectives (From admission, onward)    Start     Dose/Rate Route Frequency Ordered Stop   10/15/23 0800  doxycycline  (VIBRA -TABS) tablet 100 mg        100 mg Oral Every 12 hours 10/15/23 0750     10/13/23 1000  azithromycin  (ZITHROMAX ) tablet 500 mg  Status:  Discontinued        500 mg Oral Daily 10/13/23 0304 10/14/23 1119         Subjective: Sitting up in bed getting ready to eat his lunch.  Denies any chest pain.  Some improvement with shortness of breath.  Stated ambulated in the hallway yesterday.  No abdominal pain.  Good urine output.  Pain in the right foot.    Objective: Vitals:   10/21/23 0826 10/21/23 1126 10/21/23 1412 10/21/23 1558  BP:   (!) 145/87   Pulse:   70   Resp:   20   Temp:   (!) 97.5 F (36.4 C)   TempSrc:   Oral   SpO2: 96% 96% 99% 100%  Weight:      Height:        Intake/Output Summary (Last 24 hours) at 10/21/2023 1821 Last data filed at 10/21/2023 1226 Gross per 24 hour  Intake 240 ml  Output 2000 ml  Net -1760 ml   Filed Weights   10/19/23 0501 10/20/23 0701 10/21/23 0500  Weight: 105.3 kg 105.3 kg 104.1 kg    Examination:  General exam: NAD. Respiratory system: Decreased breath sounds in the bases.  Minimal expiratory wheezing.  No crackles.  Fair air movement. Cardiovascular system: Regular rate rhythm no murmurs rubs or gallops.  No JVD.  No lower extremity edema.  Gastrointestinal system: Abdomen is soft, nontender, nondistended, obese, positive bowel sounds.  No rebound.  No guarding.   Central nervous system: Alert and oriented. No focal neurological deficits. Extremities: Right great toe exquisitely tender to palpation.  No clubbing cyanosis or edema. Skin: No rashes, lesions or ulcers Psychiatry: Judgement and insight appear normal. Mood & affect appropriate.      Data Reviewed: I have personally reviewed following labs and imaging studies  CBC: Recent Labs  Lab 10/15/23 0513 10/16/23 0507 10/21/23 0445  WBC 11.8* 12.3* 16.4*  NEUTROABS  --   --  13.4*  HGB 15.1 14.9 14.9  HCT 47.8 46.4 46.3  MCV 91.7 90.6 89.2  PLT 194 201 187    Basic Metabolic Panel: Recent Labs  Lab 10/17/23 0856 10/18/23 0503 10/19/23 0945 10/20/23 0443 10/21/23 0445  NA 134* 133* 135 136 136  K 4.8 5.1 5.2* 5.2* 5.0  CL 94* 94* 94* 98 97*  CO2 27 28 28 27 28   GLUCOSE 241* 174* 233* 184* 169*  BUN 59* 59* 58* 57* 57*  CREATININE 1.40* 1.37* 1.35* 1.23 1.24  CALCIUM  8.6* 9.0 9.0 8.7* 8.5*    GFR: Estimated Creatinine Clearance: 56.3 mL/min (by C-G formula based on SCr of 1.24 mg/dL).  Liver Function Tests: No results for input(s): AST, ALT, ALKPHOS,  BILITOT, PROT, ALBUMIN in the last 168 hours.   CBG: Recent Labs  Lab 10/14/23 2124  GLUCAP 147*     No results found for this or any previous visit (from the past 240 hours).       Radiology Studies: No results found.       Scheduled Meds:  apixaban   5 mg Oral BID   budesonide  (PULMICORT ) nebulizer solution  0.5 mg Nebulization BID   colchicine   0.6 mg Oral BID   dextromethorphan   15 mg Oral BID   doxycycline   100 mg Oral Q12H   finasteride   5 mg Oral Daily   furosemide   40 mg Oral Daily   gabapentin   600 mg Oral QHS   guaiFENesin   600 mg Oral BID   ipratropium-albuterol   3 mL Nebulization Q4H   loratadine   10 mg Oral Daily   methylPREDNISolone  (SOLU-MEDROL ) injection  40 mg Intravenous Q12H   metoprolol  tartrate  25 mg Oral BID   multivitamin with minerals  1 tablet Oral Daily   pantoprazole   40 mg Oral Q0600   polyethylene glycol  17 g Oral Daily   pravastatin   20 mg Oral Daily   QUEtiapine   50 mg Oral QHS   Continuous Infusions:   LOS: 9 days    Time spent: 40 minutes    Toribio Hummer, MD [facility]   To contact the attending  provider between 7A-7P or the covering provider during after hours 7P-7A, please log into the web site [email or web address] and access using universal [place] password for that web site. If you do not have the password, please call the hospital operator.  10/21/2023, 6:21 PM

## 2023-10-21 NOTE — Progress Notes (Signed)
 Nutrition Education Note  RD consulted for nutrition education regarding CHF.  RD provided Low Sodium Nutrition Therapy handout from the Academy of [employer] Dietetics. Reviewed patient's dietary recall. Provided examples on ways to decrease sodium intake in diet. Discouraged intake of processed foods and use of salt shaker. Encouraged fresh fruits and vegetables as well as whole grain sources of carbohydrates to maximize fiber intake.   RD discussed why it is important for patient to adhere to diet recommendations, and emphasized the role of fluids, foods to avoid, and importance of weighing self daily. Teach back method used.  Expect fair compliance. Pt is legally blind and HOH. Had lots of complaints about meals today. States his meals have been cold, late and not what he ordered. Pt at times needs assistance with tray set up. Does not feel full with meals. Approved for him to have double protein portions with meals but not of anything else. Pt appreciative.  We discussed ways to decrease sodium in foods he already eats.  Body mass index is 31.13 kg/m. Pt meets criteria for obesity based on current BMI.  Current diet order is heart healthy with 1.8L fluid restriction, patient is consuming approximately 80-100% of meals at this time. Labs and medications reviewed. No further nutrition interventions warranted at this time.  If additional nutrition issues arise, please re-consult RD.   Morna Lee, MS, RD, LDN Inpatient Clinical Dietitian Contact via Secure chat

## 2023-10-21 NOTE — Progress Notes (Signed)
 Physical Therapy Treatment Patient Details Name: Mitchell [name] MRN: [id number] DOB: [birth year] Today's Date: 10/21/2023   History of Present Illness Mitchell [name] is a 83 y.o. admitted to [facility] on 10/12/23 with dx of  Acute on chronic diastolic CHF and respiratory failure with hypoxia, with possible COPD exacerbation. PMH significant for COPD on 2-3 L O2 home oxygen , CAD, chronic diastolic CHF (last echo 12/2016), Hx NSTEMI.    PT Comments  Pt in bed on 4 lts sats 97%.  Assisted OOB to amb.  General bed mobility comments: Pt self able to transition to EOB with increased time, use of rail and HOB elevated.  Don't help, let me do it. General transfer comment: Pt was self able to rise from elevated bed but did need assistance from lower level toilet.  Pt tends to use momentum and forward weight shift to rise due to enlarged ABD girth.  Pt also required assist with peri care after BM as he was unable to reach. General Gait Details: tolerated amb a greater distance using RW (uses a Rollator at home) 125 feet with one standing rest break.  Pt required 4 lts O2 for sats to avg 92%.  Dyspnea 2/4.  Showing slight improvement. Progressing slowly. Pt plans to return home. AxO x 3 HOH and legally blind (can see shapes/shadows/no colors)   He lives home alone and his ex wife helps with groccery shopping, light house keeping and transportation to appts. Pt has a Rollator.    If plan is discharge home, recommend the following: A little help with walking and/or transfers;A little help with bathing/dressing/bathroom;Assistance with cooking/housework;Help with stairs or ramp for entrance   Can travel by private vehicle        Equipment Recommendations  None recommended by PT    Recommendations for Other Services       Precautions / Restrictions Precautions Precautions: Fall Precaution/Restrictions Comments: Home oxygen  2 lts Restrictions Weight Bearing Restrictions Per Provider Order: No     Mobility   Bed Mobility Overal bed mobility: Needs Assistance Bed Mobility: Supine to Sit     Supine to sit: Supervision     General bed mobility comments: Pt self able to transition to EOB with increased time, use of rail and HOB elevated.  Don't help, let me do it.    Transfers Overall transfer level: Needs assistance Equipment used: Rolling walker (2 wheels) Transfers: Sit to/from Stand Sit to Stand: Supervision, Contact guard assist           General transfer comment: Pt was self able to rise from elevated bed but did need assistance from lower level toilet.  Pt tends to use momentum and forward weight shift to rise due to enlarged ABD girth.  Pt also required assist with peri care after BM as he was unable to reach.    Ambulation/Gait Ambulation/Gait assistance: Contact guard assist, Supervision Gait Distance (Feet): 125 Feet Assistive device: Rolling walker (2 wheels)   Gait velocity: decreased     General Gait Details: tolerated amb a greater distance using RW (uses a Rollator at home) 125 feet with one standing rest break.  Pt required 4 lts O2 for sats to avg 92%.  Dyspnea 2/4.  Showing slight improvement. Progressing slowly.   Stairs             Wheelchair Mobility     Tilt Bed    Modified Rankin (Stroke Patients Only)       Balance  Communication Communication Factors Affecting Communication: Hearing impaired;Other (comment)  Cognition Arousal: Alert Behavior During Therapy: WFL for tasks assessed/performed   PT - Cognitive impairments: No apparent impairments                       PT - Cognition Comments: AxO x 3 HOH and legally blind (can see shapes/shadows/no colors)   He lives home alone and his ex wife helps with groccery shopping, light house keeping and transportation to appts.        Cueing Cueing Techniques: Verbal cues  Exercises      General Comments         Pertinent Vitals/Pain Pain Assessment Pain Assessment: Faces Faces Pain Scale: Hurts a little bit Pain Location: back Pain Descriptors / Indicators: Grimacing Pain Intervention(s): Monitored during session    Home Living                          Prior Function            PT Goals (current goals can now be found in the care plan section) Progress towards PT goals: Progressing toward goals    Frequency    Min 2X/week      PT Plan      Co-evaluation              AM-PAC PT 6 Clicks Mobility   Outcome Measure  Help needed turning from your back to your side while in a flat bed without using bedrails?: A Little Help needed moving from lying on your back to sitting on the side of a flat bed without using bedrails?: A Little Help needed moving to and from a bed to a chair (including a wheelchair)?: A Little Help needed standing up from a chair using your arms (e.g., wheelchair or bedside chair)?: A Little Help needed to walk in hospital room?: A Little Help needed climbing 3-5 steps with a railing? : A Little 6 Click Score: 18    End of Session Equipment Utilized During Treatment: Gait belt;Oxygen  Activity Tolerance: Patient tolerated treatment well Patient left: in chair;with call bell/phone within reach Nurse Communication: Mobility status PT Visit Diagnosis: Other abnormalities of gait and mobility (R26.89);Muscle weakness (generalized) (M62.81)     Time: 8567-8495 PT Time Calculation (min) (ACUTE ONLY): 32 min  Charges:    $Gait Training: 8-22 mins $Therapeutic Activity: 8-22 mins PT General Charges $$ ACUTE PT VISIT: 1 Visit                     Katheryn Leap  PTA Acute  Rehabilitation Services Office M-F          [phone]

## 2023-10-22 DIAGNOSIS — J9621 Acute and chronic respiratory failure with hypoxia: Secondary | ICD-10-CM | POA: Diagnosis not present

## 2023-10-22 DIAGNOSIS — K5900 Constipation, unspecified: Secondary | ICD-10-CM | POA: Diagnosis not present

## 2023-10-22 DIAGNOSIS — J441 Chronic obstructive pulmonary disease with (acute) exacerbation: Secondary | ICD-10-CM | POA: Diagnosis not present

## 2023-10-22 DIAGNOSIS — E875 Hyperkalemia: Secondary | ICD-10-CM | POA: Diagnosis not present

## 2023-10-22 LAB — BASIC METABOLIC PANEL WITH GFR
Anion gap: 11 (ref 5–15)
BUN: 64 mg/dL — ABNORMAL HIGH (ref 8–23)
CO2: 29 mmol/L (ref 22–32)
Calcium: 8.7 mg/dL — ABNORMAL LOW (ref 8.9–10.3)
Chloride: 98 mmol/L (ref 98–111)
Creatinine, Ser: 1.36 mg/dL — ABNORMAL HIGH (ref 0.61–1.24)
GFR, Estimated: 52 mL/min — ABNORMAL LOW (ref >60)
Glucose, Bld: 189 mg/dL — ABNORMAL HIGH (ref 70–99)
Potassium: 4.9 mmol/L (ref 3.5–5.1)
Sodium: 138 mmol/L (ref 135–145)

## 2023-10-22 LAB — CBC
HCT: 45.8 % (ref 39.0–52.0)
Hemoglobin: 14.8 g/dL (ref 13.0–17.0)
MCH: 28.6 pg (ref 26.0–34.0)
MCHC: 32.3 g/dL (ref 30.0–36.0)
MCV: 88.6 fL (ref 80.0–100.0)
Platelets: 171 K/uL (ref 150–400)
RBC: 5.17 MIL/uL (ref 4.22–5.81)
RDW: 15.3 % (ref 11.5–15.5)
WBC: 20.8 K/uL — ABNORMAL HIGH (ref 4.0–10.5)
nRBC: 0 % (ref 0.0–0.2)

## 2023-10-22 MED ORDER — PREDNISONE 20 MG PO TABS
60.0000 mg | ORAL_TABLET | Freq: Every day | ORAL | Status: DC
  Administered 2023-10-22 – 2023-10-23 (×2): 60 mg via ORAL
  Filled 2023-10-22 (×2): qty 3

## 2023-10-22 NOTE — Progress Notes (Signed)
 PROGRESS NOTE    Mitchell [name]  FMW:[id number] DOB: 07-13-1940 DOA: 10/12/2023 PCP: Okey Carlin Redbird, MD   Chief Complaint  Patient presents with   Shortness of Breath    Brief Narrative:  83 year old male with history of COPD on 2 L at home, PAF on Eliquis , chronic hypoxia with 2 L at home, CAD status post PCI, CKD 3A, legally blind, comes into the hospital with shortness of breath and fluid retention. He tells me that ever since she was discharged a month ago, continue to gain weight, and is up at least 20 pounds of water since then. He has had pretty significant dyspnea on exertion, and worsening coughing. He feels that his legs have become more swollen. No nausea or vomiting. He just finished prednisone  and a Z-Pak last week after calling his pulmonologist.    Assessment & Plan:   Principal Problem:   Acute on chronic respiratory failure with hypoxia (HCC) Active Problems:   Hyperlipidemia   Hypertension   Hypoxia   Coronary artery disease involving native heart without angina pectoris   Gout flare   Acute on chronic diastolic (congestive) heart failure (HCC)   Hyperkalemia   Constipation   PAF (paroxysmal atrial fibrillation) (HCC)  #1 acute on chronic hypoxic respiratory failure - Patient chronically on 2 L home O2 with slightly higher O2 needs at 3 L during this hospitalization. - Likely secondary to acute COPD exacerbation and possible acute on chronic diastolic CHF. - Patient improving clinically.   - Continue treatment for acute COPD exacerbation and CHF.   - Currently on oral Lasix .   - Will transition from IV Solu-Medrol  to oral prednisone  taper.   - Follow.  2.  Acute COPD exacerbation -Patient on presentation presented with wheezing On admission and placed on IV Solu-Medrol  and nebs treatment. -Wheezing improving. - Continue Delsym , Mucinex , scheduled DuoNebs, Claritin , hypertonic saline nebs. - Transition from IV Solu-Medrol  to oral prednisone  taper  today. - Continue Pulmicort , doxycycline . - Will need outpatient follow-up with primary pulmonologist on discharge.  3.  Acute on chronic diastolic CHF exacerbation -Patient with 2D echo in 2023 with EF of 55 to 60%, LVH, grade 1 diastolic dysfunction, RV normal, mild hypokinesis of left ventricular basal inferior walls. - 2D echo noted to be not as helpful as patient noted to be uncooperative during examination. -Patient with urine output of 1.3 L over the past 24 hours. -Patient noted to be -10.7 L during this hospitalization. - Was on IV Lasix  and transitioned to oral Lasix  40 mg daily. - Strict I's and O's, daily weights.  4.  Musculoskeletal chest pain resolved -Patient noted to have some chest pain after echo was done and feels ultrasound probe was pushed too hard on his chest wall which caused pain. -Improved clinically. - Status post IV Toradol  and Robaxin . - Follow.  5.  Constipation -Noted to have received a Dulcolax suppository on 10/19/2023. - Continue MiraLAX  daily.   6.  Hyperkalemia -Potassium of 4.9.   - Repeat labs in the AM.  7.  Paroxysmal atrial fibrillation - In normal sinus rhythm.   - Lopressor  for rate control.   - Eliquis  for anticoagulation.   8.  Hypertension - Metoprolol .    9.  Hyperlipidemia - Continue statin.   10.  CAD status post PCI -Stable.  11.  BPH - Finasteride .    12.  Neuropathy - Gabapentin .   13.  Acute gouty attack - Patient with some complaints of right foot pain. -  Right great toe exquisitely tender to palpation. - Patient with history of gout. - On IV Solu-Medrol  will transition to oral prednisone .   - Continue colchicine  0.6 mg twice daily.       DVT prophylaxis: Eliquis  Code Status: DNR Family Communication: Updated patient, and ex-wife at bedside.  Disposition: Home with home health when clinically improved hopefully in the next 24 hours.  Status is: Inpatient Remains inpatient appropriate because: Severity  of illness   Consultants:  None  Procedures:  Chest x-ray 10/12/2023, 10/19/2023 2D echo 10/13/2023   Antimicrobials:  Anti-infectives (From admission, onward)    Start     Dose/Rate Route Frequency Ordered Stop   10/15/23 0800  doxycycline  (VIBRA -TABS) tablet 100 mg        100 mg Oral Every 12 hours 10/15/23 0750 10/22/23 2359   10/13/23 1000  azithromycin  (ZITHROMAX ) tablet 500 mg  Status:  Discontinued        500 mg Oral Daily 10/13/23 0304 10/14/23 1119         Subjective: Patient sitting up in recliner.  States he was able to get up and clean himself this morning and brush his teeth and put on his shoes.  Still with complaints of right great toe pain.  Stated headache coughing episode last night which improved with Robitussin.  Denies any chest pain.  States shortness of breath close to baseline.  Ex-wife at bedside.   Objective: Vitals:   10/22/23 0534 10/22/23 0745 10/22/23 0959 10/22/23 1124  BP: 132/67     Pulse: 70     Resp: 19     Temp: 98.7 F (37.1 C)     TempSrc:      SpO2: 97% 97%  98%  Weight:   103.7 kg   Height:        Intake/Output Summary (Last 24 hours) at 10/22/2023 1208 Last data filed at 10/22/2023 0942 Gross per 24 hour  Intake 1306 ml  Output 700 ml  Net 606 ml   Filed Weights   10/20/23 0701 10/21/23 0500 10/22/23 0959  Weight: 105.3 kg 104.1 kg 103.7 kg    Examination:  General exam: NAD. Respiratory system: Decreased breath sounds in the bases.  Minimal expiratory wheezing.  No crackles.  Fair air movement.  No use of accessory muscles of respiration.  Speaking in full sentences. Cardiovascular system: RRR no murmurs rubs or gallops.  No JVD.  No pitting lower extremity edema.  Gastrointestinal system: Abdomen is soft, nontender, nondistended, positive bowel sounds.  No rebound.  No guarding.   Central nervous system: Alert and oriented. No focal neurological deficits. Extremities: Right great toe exquisitely tender to palpation.  No  clubbing cyanosis or edema. Skin: No rashes, lesions or ulcers Psychiatry: Judgement and insight appear normal. Mood & affect appropriate.     Data Reviewed: I have personally reviewed following labs and imaging studies  CBC: Recent Labs  Lab 10/16/23 0507 10/21/23 0445 10/22/23 0450  WBC 12.3* 16.4* 20.8*  NEUTROABS  --  13.4*  --   HGB 14.9 14.9 14.8  HCT 46.4 46.3 45.8  MCV 90.6 89.2 88.6  PLT 201 187 171    Basic Metabolic Panel: Recent Labs  Lab 10/18/23 0503 10/19/23 0945 10/20/23 0443 10/21/23 0445 10/22/23 0450  NA 133* 135 136 136 138  K 5.1 5.2* 5.2* 5.0 4.9  CL 94* 94* 98 97* 98  CO2 28 28 27 28 29   GLUCOSE 174* 233* 184* 169* 189*  BUN 59* 58* 57*  57* 64*  CREATININE 1.37* 1.35* 1.23 1.24 1.36*  CALCIUM  9.0 9.0 8.7* 8.5* 8.7*    GFR: Estimated Creatinine Clearance: 51.2 mL/min (A) (by C-G formula based on SCr of 1.36 mg/dL (H)).  Liver Function Tests: No results for input(s): AST, ALT, ALKPHOS, BILITOT, PROT, ALBUMIN in the last 168 hours.   CBG: No results for input(s): GLUCAP in the last 168 hours.    No results found for this or any previous visit (from the past 240 hours).       Radiology Studies: No results found.       Scheduled Meds:  apixaban   5 mg Oral BID   budesonide  (PULMICORT ) nebulizer solution  0.5 mg Nebulization BID   colchicine   0.6 mg Oral BID   dextromethorphan   15 mg Oral BID   doxycycline   100 mg Oral Q12H   finasteride   5 mg Oral Daily   furosemide   40 mg Oral Daily   gabapentin   600 mg Oral QHS   guaiFENesin   600 mg Oral BID   ipratropium-albuterol   3 mL Nebulization Q4H   loratadine   10 mg Oral Daily   metoprolol  tartrate  25 mg Oral BID   multivitamin with minerals  1 tablet Oral Daily   pantoprazole   40 mg Oral Q0600   polyethylene glycol  17 g Oral Daily   pravastatin   20 mg Oral Daily   predniSONE   60 mg Oral QAC breakfast   QUEtiapine   50 mg Oral QHS   Continuous  Infusions:   LOS: 10 days    Time spent: 40 minutes    Toribio Hummer, MD [facility]   To contact the attending provider between 7A-7P or the covering provider during after hours 7P-7A, please log into the web site [email or web address] and access using universal [place] password for that web site. If you do not have the password, please call the hospital operator.  10/22/2023, 12:08 PM

## 2023-10-22 NOTE — Progress Notes (Signed)
 OT Cancellation Note  Patient Details Name: Mitchell [name] MRN: [id number] DOB: 06/12/1940   Cancelled Treatment:    Reason Eval/Treat Not Completed: Fatigue/lethargy limiting ability to participate  Asleep, max encouragement for OT but refused due to fatigue. OT will re-attempt visit as scheduled allows.   Mazella Deen OT/L Acute Rehabilitation Department  [phone]   10/22/2023, 2:50 PM

## 2023-10-22 NOTE — Plan of Care (Signed)

## 2023-10-23 ENCOUNTER — Ambulatory Visit: Admitting: Pulmonary Disease

## 2023-10-23 DIAGNOSIS — R0902 Hypoxemia: Secondary | ICD-10-CM

## 2023-10-23 DIAGNOSIS — J441 Chronic obstructive pulmonary disease with (acute) exacerbation: Secondary | ICD-10-CM | POA: Diagnosis not present

## 2023-10-23 LAB — CBC
HCT: 44.4 % (ref 39.0–52.0)
Hemoglobin: 14.6 g/dL (ref 13.0–17.0)
MCH: 29 pg (ref 26.0–34.0)
MCHC: 32.9 g/dL (ref 30.0–36.0)
MCV: 88.3 fL (ref 80.0–100.0)
Platelets: 158 K/uL (ref 150–400)
RBC: 5.03 MIL/uL (ref 4.22–5.81)
RDW: 15.5 % (ref 11.5–15.5)
WBC: 18.1 K/uL — ABNORMAL HIGH (ref 4.0–10.5)
nRBC: 0 % (ref 0.0–0.2)

## 2023-10-23 LAB — BASIC METABOLIC PANEL WITH GFR
Anion gap: 6 (ref 5–15)
BUN: 57 mg/dL — ABNORMAL HIGH (ref 8–23)
CO2: 30 mmol/L (ref 22–32)
Calcium: 8 mg/dL — ABNORMAL LOW (ref 8.9–10.3)
Chloride: 99 mmol/L (ref 98–111)
Creatinine, Ser: 1.15 mg/dL (ref 0.61–1.24)
GFR, Estimated: >60 mL/min (ref >60)
Glucose, Bld: 143 mg/dL — ABNORMAL HIGH (ref 70–99)
Potassium: 4.1 mmol/L (ref 3.5–5.1)
Sodium: 135 mmol/L (ref 135–145)

## 2023-10-23 MED ORDER — METHYLPREDNISOLONE SODIUM SUCC 40 MG IJ SOLR
40.0000 mg | Freq: Two times a day (BID) | INTRAMUSCULAR | Status: DC
  Administered 2023-10-23 – 2023-10-24 (×3): 40 mg via INTRAVENOUS
  Filled 2023-10-23 (×3): qty 1

## 2023-10-23 MED ORDER — DOCUSATE SODIUM 100 MG PO CAPS
100.0000 mg | ORAL_CAPSULE | Freq: Every day | ORAL | Status: DC
  Administered 2023-10-23 – 2023-10-25 (×3): 100 mg via ORAL
  Filled 2023-10-23 (×3): qty 1

## 2023-10-23 NOTE — Progress Notes (Signed)
 Occupational Therapy Treatment Patient Details Name: Mitchell [name] MRN: [id number] DOB: [birth year] Today's Date: 10/23/2023   History of present illness Mitchell [name] is a 83 y.o. admitted to [facility] on 10/12/23 with dx of  Acute on chronic diastolic CHF and respiratory failure with hypoxia, with possible COPD exacerbation. PMH significant for COPD on 2-3 L O2 home oxygen , CAD, chronic diastolic CHF (last echo 12/2016), Hx NSTEMI.   OT comments  Patient seen for skilled OT session this afternoon. Open and agreeable for all therapy presented including multiple amb intervals, toileting with adapted toileting tongs for hygiene progression and basic pacing, safety and energy conservation. Patient requesting in addition to [facility], a blind services referral as patient had in past and felt was highly effective to assist with compensatory low vision strategies. OT will continue to follow to allow for safe d/c home with services and caregiver/family support and assistance as per patient preference vs rehab stay.       If plan is discharge home, recommend the following:  A little help with walking and/or transfers;A lot of help with bathing/dressing/bathroom;Assistance with cooking/housework;Direct supervision/assist for medications management;Direct supervision/assist for financial management;Assist for transportation;Help with stairs or ramp for entrance   Equipment Recommendations  None recommended by OT;Other (comment) (OT issued reacher and adapted toilet tongs)       Precautions / Restrictions Precautions Precautions: Fall Recall of Precautions/Restrictions: Intact Precaution/Restrictions Comments: Home oxygen  2 lts Restrictions Weight Bearing Restrictions Per Provider Order: No Other Position/Activity Restrictions: legally blind       Mobility Bed Mobility Overal bed mobility:  (was up in recliner and remained)                  Transfers Overall transfer level: Needs  assistance Equipment used: Rolling walker (2 wheels) Transfers: Sit to/from Stand, Bed to chair/wheelchair/BSC Sit to Stand: Supervision, Contact guard assist     Step pivot transfers: Contact guard assist     General transfer comment: amb x 4 intervals out into hallway x 2 to window ~ 25 ft with seated rest, toileting in bathroom with 10 ft x 2     Balance Overall balance assessment: Needs assistance Sitting-balance support: No upper extremity supported Sitting balance-Leahy Scale: Good     Standing balance support: Bilateral upper extremity supported, During functional activity Standing balance-Leahy Scale: Poor                             ADL either performed or assessed with clinical judgement   ADL Overall ADL's : Needs assistance/impaired                         Toilet Transfer: Contact guard assist;Grab Paramedic Details (indicate cue type and reason): min cues for weightshifting to power up Toileting- Clothing Manipulation and Hygiene: With adaptive equipment;Minimal assistance;Sitting/lateral lean Toileting - Clothing Manipulation Details (indicate cue type and reason): OT continued to work with patient with BM hygiene with use of toilet tongs, education and support with improvmeent of concept and functional reach     Functional mobility during ADLs: Contact guard assist;Rolling walker (2 wheels);Cueing for safety General ADL Comments: pacing, functional reach and safety due tolow vision    Extremity/Trunk Assessment Upper Extremity Assessment Upper Extremity Assessment: Overall WFL for tasks assessed   Lower Extremity Assessment Lower Extremity Assessment: Defer to PT evaluation        Vision  Vision Assessment?: Yes Additional Comments: legally blind         Communication Communication Communication: Impaired Factors Affecting Communication: Hearing impaired;Other (comment)   Cognition Arousal:  Alert Behavior During Therapy: WFL for tasks assessed/performed Cognition: No apparent impairments                               Following commands: Intact        Cueing   Cueing Techniques: Verbal cues        General Comments on 2 ltrs via [place] this session with SpO2 96-98% with min cues for pacing and breathing integration    Pertinent Vitals/ Pain       Pain Assessment Pain Assessment: Faces Faces Pain Scale: Hurts a little bit Pain Location: chest Pain Descriptors / Indicators: Dull Pain Intervention(s): Monitored during session, Repositioned, Relaxation   Frequency  Min 2X/week        Progress Toward Goals  OT Goals(current goals can now be found in the care plan section)  Progress towards OT goals: Progressing toward goals  Acute Rehab OT Goals Patient Stated Goal: to get rid of this couch and chest pain OT Goal Formulation: With patient Time For Goal Achievement: 10/28/23 Potential to Achieve Goals: Good ADL Goals Pt Will Perform Lower Body Bathing: with modified independence;with adaptive equipment;sit to/from stand Pt Will Perform Lower Body Dressing: with modified independence;with adaptive equipment;sit to/from stand Pt Will Transfer to Toilet: with modified independence;ambulating;grab bars Pt Will Perform Toileting - Clothing Manipulation and hygiene: with adaptive equipment;with modified independence;sit to/from stand Additional ADL Goal #1: Patient will teach back 4/4 ECT's during ADL's and mobility with min cues  Plan         AM-PAC OT 6 Clicks Daily Activity     Outcome Measure   Help from another person eating meals?: None Help from another person taking care of personal grooming?: None Help from another person toileting, which includes using toliet, bedpan, or urinal?: A Little Help from another person bathing (including washing, rinsing, drying)?: A Little Help from another person to put on and taking off regular upper body  clothing?: None Help from another person to put on and taking off regular lower body clothing?: A Little 6 Click Score: 21    End of Session Equipment Utilized During Treatment: Gait belt;Rolling walker (2 wheels);Oxygen   OT Visit Diagnosis: Unsteadiness on feet (R26.81);Muscle weakness (generalized) (M62.81);Low vision, both eyes (H54.2);Pain   Activity Tolerance Patient tolerated treatment well   Patient Left in chair;with call bell/phone within reach;with chair alarm set;with family/visitor present   Nurse Communication Mobility status;Other (comment) (BM in flowsheets)        Time: 8394-8354 OT Time Calculation (min): 40 min  Charges: OT General Charges $OT Visit: 1 Visit OT Treatments $Self Care/Home Management : 8-22 mins $Therapeutic Activity: 8-22 mins  Caralyn Twining OT/L Acute Rehabilitation Department  [phone]  10/23/2023, 5:44 PM

## 2023-10-23 NOTE — TOC Transition Note (Signed)
 Transition of Care [facility]) - Discharge Note   Patient Details  Name: Mitchell [name] MRN: [id number] Date of Birth: 16-Sep-1940  [facility] CM/SW Contact:  Bascom Service, RN Phone Number: 10/23/2023, 11:16 AM   Clinical Narrative:  HHC all set up. already has home 02-travel tank. No further CM needs    Final next level of care: Home w Home Health Services Barriers to Discharge: No Barriers Identified   Patient Goals and CMS Choice Patient states their goals for this hospitalization and ongoing recovery are:: Home CMS Medicare.gov Compare Post Acute Care list provided to:: Patient Choice offered to / list presented to : Patient      Discharge Placement                       Discharge Plan and Services Additional resources added to the After Visit Summary for     Discharge Planning Services: CM Consult Post Acute Care Choice: Home Health                    HH Arranged: RN, PT, OT, Nurse's Aide [facility] Agency: [facility] Date [facility] Agency Contacted: 10/23/23 Time HH Agency Contacted: 1116 Representative spoke with at [facility] Agency: Arna  Social Drivers of Health (SDOH) Interventions SDOH Screenings   Food Insecurity: Food Insecurity Present (10/13/2023)  Housing: Low Risk  (10/13/2023)  Transportation Needs: Unmet Transportation Needs (10/13/2023)  Utilities: At Risk (10/13/2023)  Alcohol  Screen: Low Risk  (08/24/2021)  Depression (PHQ2-9): Low Risk  (02/07/2019)  Social Connections: Socially Isolated (10/13/2023)  Tobacco Use: Medium Risk (10/15/2023)     Readmission Risk Interventions    09/17/2023   12:02 PM  Readmission Risk Prevention Plan  Transportation Screening Complete  PCP or Specialist Appt within 3-5 Days Complete  HRI or Home Care Consult Complete  Social Work Consult for Recovery Care Planning/Counseling Complete  Palliative Care Screening Not Applicable  Medication Review Oceanographer) Complete

## 2023-10-23 NOTE — Progress Notes (Signed)
 Progress Note   Patient: Mitchell [name] FMW:[id number] DOB: September 18, 1940 DOA: 10/12/2023     11 DOS: the patient was seen and examined on 10/23/2023   Brief hospital course: 83 year old male with history of COPD on 2 L at home, PAF on Eliquis , chronic hypoxia with 2 L at home, CAD status post PCI, CKD 3A, legally blind, comes into the hospital with shortness of breath and fluid retention. He tells me that ever since she was discharged a month ago, continue to gain weight, and is up at least 20 pounds of water since then. He has had pretty significant dyspnea on exertion, and worsening coughing. He feels that his legs have become more swollen. No nausea or vomiting. He just finished prednisone  and a Z-Pak last week after calling his pulmonologist.   Assessment and Plan: 1 acute on chronic hypoxic respiratory failure - Patient chronically on 2 L home O2 with slightly higher O2 needs at 3 L during this hospitalization and remains on 3LNC - Likely secondary to acute COPD exacerbation and possible acute on chronic diastolic CHF. - Patient improving clinically but still sob today  - Currently on oral Lasix .   - Pt continues to wheeze and report feeling bad. Will resume IV steroids for now.   - Follow.   2.  Acute COPD exacerbation -Patient on presentation presented with wheezing On admission and placed on IV Solu-Medrol  and nebs treatment. -Wheezing improving. - Continue Delsym , Mucinex , scheduled DuoNebs, Claritin , hypertonic saline nebs. - Continue Pulmicort , doxycycline . - Will need outpatient follow-up with primary pulmonologist on discharge -continues to feel sob, improved with neb tx .Will cont IV solumedrol for now.   3.  Acute on chronic diastolic CHF exacerbation -Patient with 2D echo in 2023 with EF of 55 to 60%, LVH, grade 1 diastolic dysfunction, RV normal, mild hypokinesis of left ventricular basal inferior walls. - Was on IV Lasix  and transitioned to oral Lasix  40 mg daily. -  Strict I's and O's, daily weights.   4.  Musculoskeletal chest pain resolved -Patient noted to have some chest pain after echo was done and feels ultrasound probe was pushed too hard on his chest wall which caused pain. -Improved clinically. - Status post IV Toradol  and Robaxin . - Follow.   5.  Constipation -Noted to have received a Dulcolax suppository on 10/19/2023. - Continue MiraLAX  daily.    6.  Hyperkalemia -Normalized   7.  Paroxysmal atrial fibrillation - In normal sinus rhythm.   - Lopressor  for rate control.   - Eliquis  for anticoagulation.    8.  Hypertension - Metoprolol .     9.  Hyperlipidemia - Continue statin.    10.  CAD status post PCI -Stable.   11.  BPH - Finasteride .     12.  Neuropathy - Gabapentin .    13.  Acute gouty attack - Patient continues with complaints of R foot pain - Right great toe exquisitely tender to palpation. - Patient with history of gout. - Continue colchicine  0.6 mg twice daily.   -resumed IV steroid per above       Subjective: Still sob and complaining of R foot pain  Physical Exam: Vitals:   10/23/23 0815 10/23/23 1155 10/23/23 1205 10/23/23 1541  BP:  124/77    Pulse:  60    Resp:  20    Temp:  98.9 F (37.2 C)    TempSrc:  Oral    SpO2: 95% 97% 94% 97%  Weight:  Height:       General exam: Awake, laying in bed, in nad Respiratory system: Normal respiratory effort, no wheezing Cardiovascular system: regular rate, s1, s2 Gastrointestinal system: Soft, nondistended, positive BS Central nervous system: CN2-12 grossly intact, strength intact Extremities: Perfused, no clubbing Skin: Normal skin turgor, no notable skin lesions seen Psychiatry: Mood normal // no visual hallucinations   Data Reviewed:  Labs reviewed: Na 135, K 4.1, Cr 1.15, WBC 18.1, Hgb 14.6, Plts 158  Family Communication: Pt in room, family not at bedside  Disposition: Status is: Inpatient Remains inpatient appropriate because:  severity of illness  Planned Discharge Destination: Home    Author: Garnette Pelt, MD 10/23/2023 5:16 PM  For on call review [email or web address].

## 2023-10-23 NOTE — Hospital Course (Signed)
 83 year old male with history of COPD on 2 L at home, PAF on Eliquis , chronic hypoxia with 2 L at home, CAD status post PCI, CKD 3A, legally blind, comes into the hospital with shortness of breath and fluid retention. He tells me that ever since she was discharged a month ago, continue to gain weight, and is up at least 20 pounds of water since then. He has had pretty significant dyspnea on exertion, and worsening coughing. He feels that his legs have become more swollen. No nausea or vomiting. He just finished prednisone  and a Z-Pak last week after calling his pulmonologist.

## 2023-10-23 NOTE — Progress Notes (Signed)
 PT Cancellation Note  Patient Details Name: LON KLIPPEL MRN: [id number] DOB: [birth year]   Cancelled Treatment:    Reason Eval/Treat Not Completed: Other (comment). Pt declines due to multiple reasons including wanting to wash up, eat lunch, and have a bowel movement before mobilizing with therapy. Will check back for PT eval as schedule permits.    Metta Ave PT, DPT 10/23/23, 12:06 PM

## 2023-10-24 ENCOUNTER — Inpatient Hospital Stay (HOSPITAL_COMMUNITY)

## 2023-10-24 DIAGNOSIS — J9621 Acute and chronic respiratory failure with hypoxia: Secondary | ICD-10-CM | POA: Diagnosis not present

## 2023-10-24 DIAGNOSIS — J441 Chronic obstructive pulmonary disease with (acute) exacerbation: Secondary | ICD-10-CM | POA: Diagnosis not present

## 2023-10-24 LAB — COMPREHENSIVE METABOLIC PANEL WITH GFR
ALT: 30 U/L (ref 0–44)
AST: 22 U/L (ref 15–41)
Albumin: 2.6 g/dL — ABNORMAL LOW (ref 3.5–5.0)
Alkaline Phosphatase: 47 U/L (ref 38–126)
Anion gap: 8 (ref 5–15)
BUN: 47 mg/dL — ABNORMAL HIGH (ref 8–23)
CO2: 28 mmol/L (ref 22–32)
Calcium: 7.8 mg/dL — ABNORMAL LOW (ref 8.9–10.3)
Chloride: 97 mmol/L — ABNORMAL LOW (ref 98–111)
Creatinine, Ser: 1.11 mg/dL (ref 0.61–1.24)
GFR, Estimated: >60 mL/min (ref >60)
Glucose, Bld: 192 mg/dL — ABNORMAL HIGH (ref 70–99)
Potassium: 4.4 mmol/L (ref 3.5–5.1)
Sodium: 133 mmol/L — ABNORMAL LOW (ref 135–145)
Total Bilirubin: 0.8 mg/dL (ref 0.0–1.2)
Total Protein: 4.8 g/dL — ABNORMAL LOW (ref 6.5–8.1)

## 2023-10-24 LAB — CBC
HCT: 46.3 % (ref 39.0–52.0)
Hemoglobin: 14.9 g/dL (ref 13.0–17.0)
MCH: 28.6 pg (ref 26.0–34.0)
MCHC: 32.2 g/dL (ref 30.0–36.0)
MCV: 88.9 fL (ref 80.0–100.0)
Platelets: 154 K/uL (ref 150–400)
RBC: 5.21 MIL/uL (ref 4.22–5.81)
RDW: 15.3 % (ref 11.5–15.5)
WBC: 17.4 K/uL — ABNORMAL HIGH (ref 4.0–10.5)
nRBC: 0 % (ref 0.0–0.2)

## 2023-10-24 MED ORDER — PREDNISONE 20 MG PO TABS
40.0000 mg | ORAL_TABLET | Freq: Every day | ORAL | Status: DC
  Administered 2023-10-25: 40 mg via ORAL
  Filled 2023-10-24: qty 2

## 2023-10-24 NOTE — Progress Notes (Signed)
 Physical Therapy Treatment Patient Details Name: Mitchell [name] MRN: [id number] DOB: 04/05/1941 Today's Date: 10/24/2023   History of Present Illness Mitchell [name] is a 83 y.o. admitted to [facility] on 10/12/23 with dx of  Acute on chronic diastolic CHF and respiratory failure with hypoxia, with possible COPD exacerbation. PMH significant for COPD on 2-3 L O2 home oxygen , CAD, chronic diastolic CHF (last echo 12/2016), Hx NSTEMI.    PT Comments  Much improved.  Pt appears at prior level of mobility.  He hopes to D/C to home tomorrow. Chart review mentions Gout R LE but Pt exhibited NO symptoms, equal WBing and NO pain reported. If Pt is medically cleared to D/C tomorrow, needs to be before NOON.  He will not have a ride home after 1pm per Ex Spouse.  LPT has rec HH    If plan is discharge home, recommend the following: A little help with walking and/or transfers;A little help with bathing/dressing/bathroom;Assistance with cooking/housework;Help with stairs or ramp for entrance   Can travel by private vehicle        Equipment Recommendations  None recommended by PT    Recommendations for Other Services       Precautions / Restrictions Precautions Precautions: Fall Precaution/Restrictions Comments: Home oxygen  2 lts at rest and 3 lts with activity Restrictions Weight Bearing Restrictions Per Provider Order: No Other Position/Activity Restrictions: legally blind/HOH     Mobility  Bed Mobility               General bed mobility comments: Pt sitting EOB shaving/grooming    Transfers Overall transfer level: Needs assistance Equipment used: Rolling walker (2 wheels) Transfers: Sit to/from Stand Sit to Stand: Supervision           General transfer comment: Pt self able to rise and presents with good safety cognition and enough vision to see/manage his own O2 cord as well as the rain coming down in the window.  Vision is not legal to drive.     Ambulation/Gait Ambulation/Gait assistance: Supervision, Modified independent (Device/Increase time) Gait Distance (Feet): 145 Feet Assistive device: Rolling walker (2 wheels) Gait Pattern/deviations: Step-through pattern, Decreased stride length, Trunk flexed Gait velocity: decreased     General Gait Details: Appears at prior level od mobility able to tolerate a functional distance 145 feet with good use of his walker.  Uses 3 lts with activity with avg sats 92% breathing much better, stated Pt.  No true dyspnea.  Able to tolerate the full distance.   Stairs             Wheelchair Mobility     Tilt Bed    Modified Rankin (Stroke Patients Only)       Balance                                            Communication Communication Communication: Impaired Factors Affecting Communication: Hearing impaired  Cognition Arousal: Alert     PT - Cognitive impairments: No apparent impairments                       PT - Cognition Comments: AxO x 3 HOH and legally blind (can see shapes/shadows/no colors)   He lives home alone and his ex wife helps with groccery shopping, light house keeping and transportation to appts. Lives on a first floor apartment. Following  commands: Intact      Cueing Cueing Techniques: Verbal cues  Exercises      General Comments        Pertinent Vitals/Pain Pain Assessment Pain Assessment: No/denies pain    Home Living                          Prior Function            PT Goals (current goals can now be found in the care plan section) Progress towards PT goals: Progressing toward goals    Frequency    Min 2X/week      PT Plan      Co-evaluation              AM-PAC PT 6 Clicks Mobility   Outcome Measure  Help needed turning from your back to your side while in a flat bed without using bedrails?: None Help needed moving from lying on your back to sitting on the side of a  flat bed without using bedrails?: None Help needed moving to and from a bed to a chair (including a wheelchair)?: None Help needed standing up from a chair using your arms (e.g., wheelchair or bedside chair)?: None Help needed to walk in hospital room?: None Help needed climbing 3-5 steps with a railing? : A Little 6 Click Score: 23    End of Session Equipment Utilized During Treatment: Gait belt;Oxygen  Activity Tolerance: Patient tolerated treatment well Patient left: in chair;with call bell/phone within reach Nurse Communication: Mobility status PT Visit Diagnosis: Other abnormalities of gait and mobility (R26.89);Muscle weakness (generalized) (M62.81)     Time: 8371-8343 PT Time Calculation (min) (ACUTE ONLY): 28 min  Charges:    $Gait Training: 8-22 mins $Therapeutic Activity: 8-22 mins PT General Charges $$ ACUTE PT VISIT: 1 Visit                     {Zelig Gacek  PTA Acute  Rehabilitation Services Office M-F          [phone]

## 2023-10-24 NOTE — Progress Notes (Signed)
 Progress Note   Patient: Mitchell [name] FMW:[id number] DOB: 12/11/1940 DOA: 10/12/2023     12 DOS: the patient was seen and examined on 10/24/2023   Brief hospital course: 83 year old male with history of COPD on 2 L at home, PAF on Eliquis , chronic hypoxia with 2 L at home, CAD status post PCI, CKD 3A, legally blind, comes into the hospital with shortness of breath and fluid retention. He tells me that ever since she was discharged a month ago, continue to gain weight, and is up at least 20 pounds of water since then. He has had pretty significant dyspnea on exertion, and worsening coughing. He feels that his legs have become more swollen. No nausea or vomiting. He just finished prednisone  and a Z-Pak last week after calling his pulmonologist.   Assessment and Plan: 1 acute on chronic hypoxic respiratory failure - Patient chronically on 2 L home O2, currently weaned to baseline O2 but pt still feeling slightly sob, however describes mucus vs wheezing - Likely secondary to acute COPD exacerbation and possible acute on chronic diastolic CHF. - Patient improving clinically but still sob today  - Currently on oral Lasix .   - Currently on IV steroids, will begin weaning to PO prednisone    2.  Acute COPD exacerbation -Patient on presentation presented with wheezing On admission and placed on IV Solu-Medrol  and nebs treatment. -Wheezing improving. - Continue Delsym , Mucinex , scheduled DuoNebs, Claritin , hypertonic saline nebs. - Continue Pulmicort , doxycycline . - Will need outpatient follow-up with primary pulmonologist on discharge -Now on baseline O2. Would begin weaning to PO steroids   3.  Acute on chronic diastolic CHF exacerbation -Patient with 2D echo in 2023 with EF of 55 to 60%, LVH, grade 1 diastolic dysfunction, RV normal, mild hypokinesis of left ventricular basal inferior walls. - Was on IV Lasix  and transitioned to oral Lasix  40 mg daily. - Strict I's and O's, daily weights.    4.  Musculoskeletal chest pain resolved -Patient noted to have some chest pain after echo was done and feels ultrasound probe was pushed too hard on his chest wall which caused pain. -Improved clinically. - Status post IV Toradol  and Robaxin . - Follow.   5.  Constipation -Noted to have received a Dulcolax suppository on 10/19/2023. - Continue MiraLAX  daily.    6.  Hyperkalemia -Normalized   7.  Paroxysmal atrial fibrillation - In normal sinus rhythm.   - Lopressor  for rate control.   - Eliquis  for anticoagulation.    8.  Hypertension - Metoprolol .     9.  Hyperlipidemia - Continue statin.    10.  CAD status post PCI -Stable.   11.  BPH - Finasteride .     12.  Neuropathy - Gabapentin .    13.  Acute gouty attack - Patient continues with complaints of R foot pain - Right great toe exquisitely tender to palpation. - Patient with history of gout. - Continue colchicine  0.6 mg twice daily.   -R foot xray reviewed. Findings c/w gout - Had been on steroids       Subjective: Feeling better today  Physical Exam: Vitals:   10/24/23 0500 10/24/23 0503 10/24/23 1141 10/24/23 1518  BP:  135/76  132/72  Pulse:  66  77  Resp:  19  19  Temp:  (!) 97.5 F (36.4 C)  97.7 F (36.5 C)  TempSrc:  Oral  Oral  SpO2:  96% 97% 97%  Weight: 101.2 kg     Height:  General exam: Conversant, in no acute distress Respiratory system: normal chest rise, clear, no audible wheezing Cardiovascular system: regular rhythm, s1-s2 Gastrointestinal system: Nondistended, nontender, pos BS Central nervous system: No seizures, no tremors Extremities: No cyanosis, no joint deformities Skin: No rashes, no pallor Psychiatry: Affect normal // no auditory hallucinations   Data Reviewed:  Labs reviewed: Na 133, K 4.4, Cr 1.11, WBC 17.4, Hgb 14.9  Family Communication: Pt in room, family not at bedside  Disposition: Status is: Inpatient Remains inpatient appropriate because: severity of  illness  Planned Discharge Destination: Home    Author: Garnette Pelt, MD 10/24/2023 5:25 PM  For on call review [email or web address].

## 2023-10-25 ENCOUNTER — Other Ambulatory Visit (HOSPITAL_COMMUNITY): Payer: Self-pay

## 2023-10-25 DIAGNOSIS — J441 Chronic obstructive pulmonary disease with (acute) exacerbation: Secondary | ICD-10-CM | POA: Diagnosis not present

## 2023-10-25 DIAGNOSIS — J9621 Acute and chronic respiratory failure with hypoxia: Secondary | ICD-10-CM | POA: Diagnosis not present

## 2023-10-25 LAB — CBC
HCT: 46.2 % (ref 39.0–52.0)
Hemoglobin: 14.9 g/dL (ref 13.0–17.0)
MCH: 28.9 pg (ref 26.0–34.0)
MCHC: 32.3 g/dL (ref 30.0–36.0)
MCV: 89.7 fL (ref 80.0–100.0)
Platelets: 142 K/uL — ABNORMAL LOW (ref 150–400)
RBC: 5.15 MIL/uL (ref 4.22–5.81)
RDW: 15.5 % (ref 11.5–15.5)
WBC: 19.6 K/uL — ABNORMAL HIGH (ref 4.0–10.5)
nRBC: 0 % (ref 0.0–0.2)

## 2023-10-25 LAB — COMPREHENSIVE METABOLIC PANEL WITH GFR
ALT: 33 U/L (ref 0–44)
AST: 18 U/L (ref 15–41)
Albumin: 2.6 g/dL — ABNORMAL LOW (ref 3.5–5.0)
Alkaline Phosphatase: 50 U/L (ref 38–126)
Anion gap: 7 (ref 5–15)
BUN: 44 mg/dL — ABNORMAL HIGH (ref 8–23)
CO2: 30 mmol/L (ref 22–32)
Calcium: 8 mg/dL — ABNORMAL LOW (ref 8.9–10.3)
Chloride: 100 mmol/L (ref 98–111)
Creatinine, Ser: 1.06 mg/dL (ref 0.61–1.24)
GFR, Estimated: >60 mL/min (ref >60)
Glucose, Bld: 146 mg/dL — ABNORMAL HIGH (ref 70–99)
Potassium: 4.4 mmol/L (ref 3.5–5.1)
Sodium: 137 mmol/L (ref 135–145)
Total Bilirubin: 1 mg/dL (ref 0.0–1.2)
Total Protein: 4.7 g/dL — ABNORMAL LOW (ref 6.5–8.1)

## 2023-10-25 MED ORDER — GUAIFENESIN ER 600 MG PO TB12
600.0000 mg | ORAL_TABLET | Freq: Two times a day (BID) | ORAL | 0 refills | Status: DC
  Filled 2023-10-25: qty 60, 30d supply, fill #0

## 2023-10-25 MED ORDER — COLCHICINE 0.6 MG PO TABS
0.6000 mg | ORAL_TABLET | Freq: Two times a day (BID) | ORAL | 0 refills | Status: DC
Start: 2023-10-25 — End: 2023-11-14
  Filled 2023-10-25: qty 60, 30d supply, fill #0

## 2023-10-25 MED ORDER — BENZONATATE 100 MG PO CAPS
100.0000 mg | ORAL_CAPSULE | Freq: Three times a day (TID) | ORAL | 0 refills | Status: DC | PRN
  Filled 2023-10-25: qty 20, 7d supply, fill #0

## 2023-10-25 MED ORDER — PANTOPRAZOLE SODIUM 40 MG PO TBEC
40.0000 mg | DELAYED_RELEASE_TABLET | Freq: Every day | ORAL | 0 refills | Status: DC
  Filled 2023-10-25: qty 30, 30d supply, fill #0

## 2023-10-25 MED ORDER — IPRATROPIUM-ALBUTEROL 0.5-2.5 (3) MG/3ML IN SOLN
3.0000 mL | Freq: Four times a day (QID) | RESPIRATORY_TRACT | Status: DC
  Administered 2023-10-25 (×2): 3 mL via RESPIRATORY_TRACT
  Filled 2023-10-25 (×2): qty 3

## 2023-10-25 MED ORDER — DEXTROMETHORPHAN POLISTIREX ER 30 MG/5ML PO SUER
15.0000 mg | Freq: Two times a day (BID) | ORAL | 0 refills | Status: DC
Start: 2023-10-25 — End: 2023-11-14
  Filled 2023-10-25: qty 89, 18d supply, fill #0

## 2023-10-25 MED ORDER — DOCUSATE SODIUM 100 MG PO CAPS
100.0000 mg | ORAL_CAPSULE | Freq: Every day | ORAL | 0 refills | Status: DC
  Filled 2023-10-25: qty 10, 10d supply, fill #0

## 2023-10-25 MED ORDER — PREDNISONE 10 MG PO TABS
ORAL_TABLET | ORAL | 0 refills | Status: AC
  Filled 2023-10-25: qty 27, 10d supply, fill #0

## 2023-10-25 NOTE — Progress Notes (Addendum)
 Occupational Therapy Treatment Patient Details Name: Mitchell [name] MRN: [id number] DOB: Aug 24, 1940 Today's Date: 10/25/2023   History of present illness Mitchell [name] is a 83 y.o. admitted to [facility] on 10/12/23 with dx of  Acute on chronic diastolic CHF and respiratory failure with hypoxia, with possible COPD exacerbation. PMH significant for COPD on 2-3 L O2 home oxygen , CAD, chronic diastolic CHF (last echo 12/2016), Hx NSTEMI.   OT comments  Patient seen for skilled OT session this am. Patient reports he is leaving today and continues to reinforce his need for in home blind services and OT secure chat to [facility] to pass along message. Patient with improved functional distance for amb, access in and out of bathroom and improved toileting with adapted methods and now use of urinal. SpO2 98% on 2 ltrs O2 during hallway amb. OT reinforced pacing, energy conservation and falls prevention this visit. OT will continue to follow acutely and HHOT recommendations appropriate for discharge.       If plan is discharge home, recommend the following:  A little help with walking and/or transfers;A lot of help with bathing/dressing/bathroom;Assistance with cooking/housework;Direct supervision/assist for medications management;Direct supervision/assist for financial management;Assist for transportation;Help with stairs or ramp for entrance   Equipment Recommendations  None recommended by OT       Precautions / Restrictions Precautions Precautions: Fall Recall of Precautions/Restrictions: Intact Precaution/Restrictions Comments: Home oxygen  2 lts at rest and 3 lts with activity Restrictions Weight Bearing Restrictions Per Provider Order: No Other Position/Activity Restrictions: legally blind/HOH       Mobility Bed Mobility Overal bed mobility: Modified Independent                  Transfers Overall transfer level: Needs assistance Equipment used: Rolling walker (2 wheels)                General transfer comment: amb with RW as above with O2 tank management and low vision navigation     Balance Overall balance assessment: Mild deficits observed, not formally tested                                         ADL either performed or assessed with clinical judgement   ADL Overall ADL's : Needs assistance/impaired Eating/Feeding: Independent   Grooming: Wash/dry hands;Wash/dry face;Oral care;Applying deodorant;Brushing hair;Modified independent;Sitting   Upper Body Bathing: Modified independent;Sitting   Lower Body Bathing: Sit to/from stand;Supervison/ safety   Upper Body Dressing : Modified independent;Sitting   Lower Body Dressing: Supervision/safety;Sit to/from stand       Toileting- Architect and Hygiene: With adaptive equipment;Minimal assistance;Sitting/lateral lean Toileting - Clothing Manipulation Details (indicate cue type and reason): ongoing training needed for full thorough bowel hygiene, rec bidet (low cost toilet attachment version), now using urinal with mod I     Functional mobility during ADLs: Supervision/safety;Rolling walker (2 wheels) (OT management of O2 tank for amb 150 ft this session) General ADL Comments: progressed activity tolerance with 98% on 2 ltrs O2    Extremity/Trunk Assessment Upper Extremity Assessment Upper Extremity Assessment: Overall WFL for tasks assessed   Lower Extremity Assessment Lower Extremity Assessment: Defer to PT evaluation        Vision   Vision Assessment?: Yes Additional Comments: legally blind, OT contacted TOC to ensure rec for blind services is put forth   Perception     Praxis  Communication Communication Communication: Impaired Factors Affecting Communication: Hearing impaired   Cognition Arousal: Alert Behavior During Therapy: WFL for tasks assessed/performed Cognition: No apparent impairments                               Following commands:  Intact        Cueing   Cueing Techniques: Verbal cues  Exercises Exercises: Other exercises (reinforced all Ue HEP and breathing integ with + teach back)       General Comments improving with overall activity tolerance as above    Pertinent Vitals/ Pain       Pain Assessment Pain Assessment: No/denies pain   Frequency  Min 2X/week        Progress Toward Goals  OT Goals(current goals can now be found in the care plan section)  Progress towards OT goals: Progressing toward goals  Acute Rehab OT Goals Patient Stated Goal: to go home today at 1 pm OT Goal Formulation: With patient Time For Goal Achievement: 10/28/23 Potential to Achieve Goals: Good ADL Goals Pt Will Perform Lower Body Bathing: with modified independence;with adaptive equipment;sit to/from stand Pt Will Perform Lower Body Dressing: with modified independence;with adaptive equipment;sit to/from stand Pt Will Transfer to Toilet: with modified independence;ambulating;grab bars Pt Will Perform Toileting - Clothing Manipulation and hygiene: with adaptive equipment;with modified independence;sit to/from stand Additional ADL Goal #1: Patient will teach back 4/4 ECT's during ADL's and mobility with min cues  Plan         AM-PAC OT 6 Clicks Daily Activity     Outcome Measure   Help from another person eating meals?: None Help from another person taking care of personal grooming?: None Help from another person toileting, which includes using toliet, bedpan, or urinal?: A Little Help from another person bathing (including washing, rinsing, drying)?: A Little Help from another person to put on and taking off regular upper body clothing?: None Help from another person to put on and taking off regular lower body clothing?: A Little 6 Click Score: 21    End of Session Equipment Utilized During Treatment: Gait belt;Rolling walker (2 wheels);Oxygen   OT Visit Diagnosis: Unsteadiness on feet (R26.81);Muscle  weakness (generalized) (M62.81);Low vision, both eyes (H54.2);Pain   Activity Tolerance Patient tolerated treatment well   Patient Left in bed;with call bell/phone within reach;with bed alarm set (wanted to rest before discharge)   Nurse Communication Mobility status;Other (comment)        Time: 9083-9047 OT Time Calculation (min): 36 min  Charges: OT General Charges $OT Visit: 1 Visit OT Treatments $Self Care/Home Management : 8-22 mins $Therapeutic Activity: 8-22 mins  Olivia Pavelko OT/L Acute Rehabilitation Department  [phone]  10/25/2023, 12:29 PM

## 2023-10-25 NOTE — Progress Notes (Signed)
 Discharge medications delivered to patient at bedside [facility]

## 2023-10-25 NOTE — Discharge Summary (Signed)
 Physician Discharge Summary   Patient: Mitchell [name] MRN: [id number] DOB: 1940/06/25  Admit date:     10/12/2023  Discharge date: 10/25/23  Discharge Physician: Garnette Pelt   PCP: Okey Carlin Redbird, MD   Recommendations at discharge:    Follow up with PCP in 1-2 weeks Follow up with Cardiology: Katlyn West, NP 7/28 @10 :55 am   Discharge Diagnoses: Principal Problem:   Acute on chronic respiratory failure with hypoxia (HCC) Active Problems:   Hyperlipidemia   Hypertension   Hypoxia   Coronary artery disease involving native heart without angina pectoris   Gout flare   Acute on chronic diastolic (congestive) heart failure (HCC)   Hyperkalemia   Constipation   PAF (paroxysmal atrial fibrillation) (HCC)  Resolved Problems:   * No resolved hospital problems. *  Hospital Course: 83 year old male with history of COPD on 2 L at home, PAF on Eliquis , chronic hypoxia with 2 L at home, CAD status post PCI, CKD 3A, legally blind, comes into the hospital with shortness of breath and fluid retention. He tells me that ever since she was discharged a month ago, continue to gain weight, and is up at least 20 pounds of water since then. He has had pretty significant dyspnea on exertion, and worsening coughing. He feels that his legs have become more swollen. No nausea or vomiting. He just finished prednisone  and a Z-Pak last week after calling his pulmonologist.   Assessment and Plan: 1 acute on chronic hypoxic respiratory failure - Patient chronically on 2 L home O2, currently weaned to baseline O2 but pt still feeling slightly sob, however describes mucus vs wheezing - Likely secondary to acute COPD exacerbation and possible acute on chronic diastolic CHF. - Patient improving clinically - Currently on oral Lasix  40mg  daily.   - Was on IV steroids, discharged on prednisone  taper on d/c  2.  Acute COPD exacerbation -Patient on presentation presented with wheezing On admission and placed  on IV Solu-Medrol  and nebs treatment. -Wheezing improving. - Continued Delsym , Mucinex , scheduled DuoNebs, Claritin , hypertonic saline nebs while inpatient - Continued Pulmicort , doxycycline . - Will need outpatient follow-up with primary pulmonologist on discharge -Now on baseline O2. Would begin weaning to PO steroids   3.  Acute on chronic diastolic CHF exacerbation -Patient with 2D echo in 2023 with EF of 55 to 60%, LVH, grade 1 diastolic dysfunction, RV normal, mild hypokinesis of left ventricular basal inferior walls. - Was on IV Lasix  and transitioned to oral Lasix  40 mg daily. - Strict I's and O's, daily weights. - Recommend close outpatient f/u with Cardiololgy   4.  Musculoskeletal chest pain resolved -Patient noted to have some chest pain after echo was done and feels ultrasound probe was pushed too hard on his chest wall which caused pain. -Improved clinically. - Status post IV Toradol  and Robaxin .   5.  Constipation -Noted to have received a Dulcolax suppository on 10/19/2023. - Continue MiraLAX  daily.    6.  Hyperkalemia -Normalized   7.  Paroxysmal atrial fibrillation - In normal sinus rhythm.   - Lopressor  for rate control.   - Eliquis  for anticoagulation.    8.  Hypertension - Metoprolol .     9.  Hyperlipidemia - Continue statin.    10.  CAD status post PCI -Stable.   11.  BPH - Finasteride .     12.  Neuropathy - Gabapentin .    13.  Acute gouty attack - Patient continues with complaints of R foot pain - Right  great toe exquisitely tender to palpation. - Patient with history of gout. - Continue colchicine  0.6 mg twice daily.   -R foot xray reviewed. Findings c/w gout - complete steroid taper        Consultants:  Procedures performed:   Disposition: Home Diet recommendation:  Regular diet DISCHARGE MEDICATION: Allergies as of 10/25/2023       Reactions   Atorvastatin  Other (See Comments)   Severe myalgias- just paralyzed me; I can't take  it   Crestor  [rosuvastatin ] Other (See Comments)   Myalgias    Norco [hydrocodone -acetaminophen ] Hives        Medication List     STOP taking these medications    amoxicillin -clavulanate 875-125 MG tablet Commonly known as: AUGMENTIN    azithromycin  250 MG tablet Commonly known as: ZITHROMAX    potassium chloride  10 MEQ tablet Commonly known as: KLOR-CON        TAKE these medications    albuterol  108 (90 Base) MCG/ACT inhaler Commonly known as: VENTOLIN  HFA Inhale 2 puffs into the lungs every 4 (four) hours as needed for wheezing or shortness of breath.   allopurinol  100 MG tablet Commonly known as: ZYLOPRIM  Take 100 mg by mouth in the morning.   apixaban  5 MG Tabs tablet Commonly known as: Eliquis  Take 1 tablet (5 mg total) by mouth 2 (two) times daily.   benzonatate  100 MG capsule Commonly known as: TESSALON  Take 1 capsule (100 mg total) by mouth 3 (three) times daily as needed for cough.   Breztri  Aerosphere 160-9-4.8 MCG/ACT Aero inhaler Generic drug: budesonide -glycopyrrolate -formoterol  Inhale 2 puffs into the lungs 2 (two) times daily.   colchicine  0.6 MG tablet Take 1 tablet (0.6 mg total) by mouth 2 (two) times daily.   dextromethorphan  30 MG/5ML liquid Commonly known as: DELSYM  Take 2.5 mLs (15 mg total) by mouth 2 (two) times daily.   docusate sodium  100 MG capsule Commonly known as: COLACE Take 1 capsule (100 mg total) by mouth daily.   feeding supplement Liqd Take 237 mLs by mouth 2 (two) times daily between meals.   finasteride  5 MG tablet Commonly known as: PROSCAR  Take 5 mg by mouth in the morning. Reported on 05/04/2015   furosemide  40 MG tablet Commonly known as: LASIX  Take 40 mg by mouth in the morning.   gabapentin  300 MG capsule Commonly known as: NEURONTIN  Take 600 mg by mouth at bedtime.   guaiFENesin  600 MG 12 hr tablet Commonly known as: MUCINEX  Take 1 tablet (600 mg total) by mouth 2 (two) times daily.    ipratropium-albuterol  0.5-2.5 (3) MG/3ML Soln Commonly known as: DUONEB Take 3 mLs by nebulization every 4 (four) hours as needed (shortness of breath/wheeze).   metoprolol  tartrate 25 MG tablet Commonly known as: LOPRESSOR  TAKE 1 TABLET TWICE DAILY What changed: when to take this   nitroGLYCERIN  0.4 MG SL tablet Commonly known as: NITROSTAT  Place 0.4 mg under the tongue every 5 (five) minutes as needed for chest pain.   OXYGEN  Inhale 2-3 L/min into the lungs continuous.   pantoprazole  40 MG tablet Commonly known as: PROTONIX  Take 1 tablet (40 mg total) by mouth daily at 6 (six) AM. Start taking on: October 26, 2023   pravastatin  20 MG tablet Commonly known as: PRAVACHOL  Take 1 tablet (20 mg total) by mouth every evening. What changed: when to take this   predniSONE  10 MG tablet Commonly known as: DELTASONE  Take 6 tablets (60 mg total) by mouth daily for 2 days, THEN 4 tablets (40 mg total)  daily for 2 days, THEN 2 tablets (20 mg total) daily for 2 days, THEN 1 tablet (10 mg total) daily for 2 days, THEN 0.5 tablets (5 mg total) daily for 2 days. Start taking on: October 25, 2023 What changed:  medication strength See the new instructions. Notes to patient: Please follow the Dosing Instructions for this Medication    QUEtiapine  50 MG tablet Commonly known as: SEROQUEL  Take 1 tablet (50 mg total) by mouth at bedtime.   Tylenol  325 MG tablet Generic drug: acetaminophen  Take 325-650 mg by mouth every 6 (six) hours as needed for mild pain (pain score 1-3) or headache.        Follow-up Information     Okey Carlin Redbird, MD Follow up in 2 week(s).   Specialty: Family Medicine Why: Hospital follow up Contact information: [address] [place] [address] [phone]         Devora Rosabel BIRCH, NP Follow up.   Specialty: Cardiology Why: 7/28 @10 :55 am, Hospital follow up Contact information: [address] [place] [address] [phone]                 Discharge Exam: Fredricka Weights   10/23/23 0500 10/24/23 0500 10/25/23 9372  Weight: 101.2 kg 101.2 kg 103 kg   General exam: Awake, laying in bed, in nad Respiratory system: Normal respiratory effort, no wheezing Cardiovascular system: regular rate, s1, s2 Gastrointestinal system: Soft, nondistended, positive BS Central nervous system: CN2-12 grossly intact, strength intact Extremities: Perfused, no clubbing Skin: Normal skin turgor, no notable skin lesions seen Psychiatry: Mood normal // no visual hallucinations   Condition at discharge: fair  The results of significant diagnostics from this hospitalization (including imaging, microbiology, ancillary and laboratory) are listed below for reference.   Imaging Studies: DG Foot 2 Views Right Result Date: 10/24/2023 CLINICAL DATA:  History of gout with right foot pain EXAM: RIGHT FOOT - 2 VIEW COMPARISON:  None Available. FINDINGS: There is no evidence of fracture or dislocation. Questionable subtle juxta-articular erosion at the medial base of the great toe proximal phalanx, which can be seen in the setting of gout. Degenerative changes of the tarsometatarsal joint. Plantar calcaneal spur. Soft tissues are unremarkable. IMPRESSION: 1. Questionable subtle juxta-articular erosion at the medial base of the great toe proximal phalanx, which can be seen in the setting of gout. 2. Degenerative changes of the tarsometatarsal joint. Electronically Signed   By: Limin  Xu M.D.   On: 10/24/2023 09:17   DG Chest Port 1V same Day Result Date: 10/19/2023 CLINICAL DATA:  Shortness of breath and dyspnea. EXAM: PORTABLE CHEST 1 VIEW COMPARISON:  10/12/2023 FINDINGS: Heart size and mediastinal contours are unremarkable. There is no pleural fluid, interstitial edema or airspace disease. The visualized osseous structures are unremarkable. IMPRESSION: No active disease. Electronically Signed   By: Waddell Calk M.D.   On: 10/19/2023 14:03    ECHOCARDIOGRAM COMPLETE Result Date: 10/13/2023    ECHOCARDIOGRAM REPORT   Patient Name:   CRISS PALLONE Date of Exam: 10/13/2023 Medical Rec #:  [id number]       Height:       72.0 in Accession #:    [id number]      Weight:       236.8 lb Date of Birth:  August 08, 1940       BSA:          2.289 m Patient Age:    82 years        BP:  152/80 mmHg Patient Gender: M               HR:           57 bpm. Exam Location:  Inpatient Procedure: 2D Echo, Cardiac Doppler and Color Doppler (Both Spectral and Color            Flow Doppler were utilized during procedure). Indications:    Dyspnea  History:        Patient has prior history of Echocardiogram examinations, most                 recent 08/24/2021. Signs/Symptoms:Shortness of Breath; Risk                 Factors:Hypertension.  Sonographer:    Benard Stallion Referring Phys: [id number] PROSPER M AMPONSAH IMPRESSIONS  1. Very limited study - only one view is obtained. Patient refused additional evaluation due to pain.  2. Left ventricular ejection fraction, by estimation, is 55 to 60%. The left ventricle has normal function. Left ventricular endocardial border not optimally defined to evaluate regional wall motion. Left ventricular diastolic function could not be evaluated.  3. Right ventricular systolic function is normal. The right ventricular size is normal.  4. The mitral valve is grossly normal. No evidence of mitral valve regurgitation. No evidence of mitral stenosis.  5. The aortic valve is grossly normal. Aortic valve regurgitation is not visualized. No aortic stenosis is present. Conclusion(s)/Recommendation(s): The study should be repeated when the patient can cooperate. FINDINGS  Left Ventricle: Left ventricular ejection fraction, by estimation, is 55 to 60%. The left ventricle has normal function. Left ventricular endocardial border not optimally defined to evaluate regional wall motion. The left ventricular internal cavity size was normal in size.  Suboptimal image quality limits for assessment of left ventricular hypertrophy. Left ventricular diastolic function could not be evaluated. Right Ventricle: The right ventricular size is normal. Right vetricular wall thickness was not well visualized. Right ventricular systolic function is normal. Left Atrium: Left atrial size was not well visualized. Right Atrium: Right atrial size was not well visualized. Pericardium: There is no evidence of pericardial effusion. Mitral Valve: The mitral valve is grossly normal. No evidence of mitral valve regurgitation. No evidence of mitral valve stenosis. Tricuspid Valve: The tricuspid valve is not well visualized. Aortic Valve: The aortic valve is grossly normal. Aortic valve regurgitation is not visualized. No aortic stenosis is present. Pulmonic Valve: The pulmonic valve was not well visualized. Aorta: The aortic root is normal in size and structure. IAS/Shunts: The interatrial septum was not well visualized. Additional Comments: Very limited study - only one view is obtained. Patient refused additional evaluation due to pain.  LEFT VENTRICLE PLAX 2D LVIDd:         3.90 cm LVIDs:         2.70 cm LV PW:         1.00 cm LV IVS:        1.00 cm LVOT diam:     2.20 cm LVOT Area:     3.80 cm   AORTA Ao Root diam: 3.40 cm  SHUNTS Systemic Diam: 2.20 cm Jerel Croitoru MD Electronically signed by Jerel Balding MD Signature Date/Time: 10/13/2023/12:02:26 PM    Final    DG Chest Portable 1 View Result Date: 10/12/2023 CLINICAL DATA:  Shortness of breath. EXAM: PORTABLE CHEST 1 VIEW COMPARISON:  None Available. FINDINGS: The heart size and mediastinal contours are within normal limits. There is no evidence of  acute infiltrate, pleural effusion or pneumothorax. Multilevel degenerative changes are seen throughout the thoracic spine. IMPRESSION: No active disease. Electronically Signed   By: Suzen Dials M.D.   On: 10/12/2023 20:57    Microbiology: Results for orders placed or  performed during the hospital encounter of 09/13/23  Resp panel by RT-PCR (RSV, Flu A&B, Covid) Anterior Nasal Swab     Status: None   Collection Time: 09/13/23  4:21 PM   Specimen: Anterior Nasal Swab  Result Value Ref Range Status   SARS Coronavirus 2 by RT PCR NEGATIVE NEGATIVE Final    Comment: (NOTE) SARS-CoV-2 target nucleic acids are NOT DETECTED.  The SARS-CoV-2 RNA is generally detectable in upper respiratory specimens during the acute phase of infection. The lowest concentration of SARS-CoV-2 viral copies this assay can detect is 138 copies/mL. A negative result does not preclude SARS-Cov-2 infection and should not be used as the sole basis for treatment or other patient management decisions. A negative result may occur with  improper specimen collection/handling, submission of specimen other than nasopharyngeal swab, presence of viral mutation(s) within the areas targeted by this assay, and inadequate number of viral copies(<138 copies/mL). A negative result must be combined with clinical observations, patient history, and epidemiological information. The expected result is Negative.  Fact Sheet for Patients:  BloggerCourse.com  Fact Sheet for Healthcare Providers:  SeriousBroker.it  This test is no t yet approved or cleared by the [place]  FDA and  has been authorized for detection and/or diagnosis of SARS-CoV-2 by FDA under an Emergency Use Authorization (EUA). This EUA will remain  in effect (meaning this test can be used) for the duration of the COVID-19 declaration under Section 564(b)(1) of the Act, 21 U.S.C.section 360bbb-3(b)(1), unless the authorization is terminated  or revoked sooner.       Influenza A by PCR NEGATIVE NEGATIVE Final   Influenza B by PCR NEGATIVE NEGATIVE Final    Comment: (NOTE) The Xpert Xpress SARS-CoV-2/FLU/RSV plus assay is intended as an aid in the diagnosis of influenza from  Nasopharyngeal swab specimens and should not be used as a sole basis for treatment. Nasal washings and aspirates are unacceptable for Xpert Xpress SARS-CoV-2/FLU/RSV testing.  Fact Sheet for Patients: BloggerCourse.com  Fact Sheet for Healthcare Providers: SeriousBroker.it  This test is not yet approved or cleared by the [place]  FDA and has been authorized for detection and/or diagnosis of SARS-CoV-2 by FDA under an Emergency Use Authorization (EUA). This EUA will remain in effect (meaning this test can be used) for the duration of the COVID-19 declaration under Section 564(b)(1) of the Act, 21 U.S.C. section 360bbb-3(b)(1), unless the authorization is terminated or revoked.     Resp Syncytial Virus by PCR NEGATIVE NEGATIVE Final    Comment: (NOTE) Fact Sheet for Patients: BloggerCourse.com  Fact Sheet for Healthcare Providers: SeriousBroker.it  This test is not yet approved or cleared by the [place]  FDA and has been authorized for detection and/or diagnosis of SARS-CoV-2 by FDA under an Emergency Use Authorization (EUA). This EUA will remain in effect (meaning this test can be used) for the duration of the COVID-19 declaration under Section 564(b)(1) of the Act, 21 U.S.C. section 360bbb-3(b)(1), unless the authorization is terminated or revoked.  Performed at [facility], [address], [place], [place] [address]   Culture, blood (Routine X 2) Call MD if unable to obtain prior to antibiotics being given     Status: None   Collection Time: 09/14/23 12:21 AM  Specimen: BLOOD RIGHT HAND  Result Value Ref Range Status   Specimen Description   Final    BLOOD RIGHT HAND Performed at [facility], [address] [place], [place] [address]    Special Requests   Final    BOTTLES DRAWN AEROBIC AND ANAEROBIC Blood Culture adequate  volume Performed at [facility], [address], [place], [place] [address]    Culture   Final    NO GROWTH 5 DAYS Performed at [facility], [address], [place], [place] [address]    Report Status 09/19/2023 FINAL  Final  Culture, blood (Routine X 2) Call MD if unable to obtain prior to antibiotics being given     Status: None   Collection Time: 09/14/23 12:21 AM   Specimen: BLOOD RIGHT HAND  Result Value Ref Range Status   Specimen Description   Final    BLOOD RIGHT HAND Performed at [facility], [address] [place], [place] [address]    Special Requests   Final    BOTTLES DRAWN AEROBIC AND ANAEROBIC Blood Culture adequate volume Performed at [facility] [address] [place], [place] [address]    Culture   Final    NO GROWTH 5 DAYS Performed at [facility], [address] [place], [place] [address]    Report Status 09/19/2023 FINAL  Final    Labs: CBC: Recent Labs  Lab 10/21/23 0445 10/22/23 0450 10/23/23 0508 10/24/23 0457 10/25/23 0440  WBC 16.4* 20.8* 18.1* 17.4* 19.6*  NEUTROABS 13.4*  --   --   --   --   HGB 14.9 14.8 14.6 14.9 14.9  HCT 46.3 45.8 44.4 46.3 46.2  MCV 89.2 88.6 88.3 88.9 89.7  PLT 187 171 158 154 142*   Basic Metabolic Panel: Recent Labs  Lab 10/21/23 0445 10/22/23 0450 10/23/23 0508 10/24/23 0457 10/25/23 0440  NA 136 138 135 133* 137  K 5.0 4.9 4.1 4.4 4.4  CL 97* 98 99 97* 100  CO2 28 29 30 28 30   GLUCOSE 169* 189* 143* 192* 146*  BUN 57* 64* 57* 47* 44*  CREATININE 1.24 1.36* 1.15 1.11 1.06  CALCIUM  8.5* 8.7* 8.0* 7.8* 8.0*   Liver Function Tests: Recent Labs  Lab 10/24/23 0457 10/25/23 0440  AST 22 18  ALT 30 33  ALKPHOS 47 50  BILITOT 0.8 1.0  PROT 4.8* 4.7*  ALBUMIN 2.6* 2.6*   CBG: No results for input(s): GLUCAP in the last 168 hours.  Discharge time spent: less than 30 minutes.  Signed: Garnette Pelt, MD [facility] 10/25/2023

## 2023-10-25 NOTE — Progress Notes (Signed)
 Patient to discharge to home today. Patient visually impaired and Patient's Ex Wife Dickey Reese to transport Dickey Reese assists Patient at home with Medications and all discharge Medications with the schedules reviewed via phone and understanding verbalized. Discharge AVS with the Patient at time of discharge

## 2023-10-25 NOTE — Plan of Care (Signed)

## 2023-10-25 NOTE — TOC Progression Note (Signed)
 Transition of Care [facility]) - Progression Note    Patient Details  Name: Mitchell [name] MRN: [id number] Date of Birth: 1940/12/20  Transition of [facility]) CM/SW Contact  Joshalyn Ancheta, Nathanel, RN Phone Number: 10/25/2023, 10:21 AM  Clinical Narrative:   I have called Referral to  [facility] Contact:Shanda Davis(social worker for the blind in [place] Tel#[phone].-informed Dickey of resources for the service of the blind. Has home 02, HHC all set up. Has own transport home.     Expected Discharge Plan: Home w Home Health Services Barriers to Discharge: Continued Medical Work up  Expected Discharge Plan and Services   Discharge Planning Services: CM Consult Post Acute Care Choice: Home Health Living arrangements for the past 2 months: Single Family Home                           HH Arranged: RN, PT, OT, Nurse's Aide, Social Work [employer] Agency: [facility] Date HH Agency Contacted: 10/25/23 Time HH Agency Contacted: 1021 Representative spoke with at [facility] Agency: Arna   Social Determinants of Health (SDOH) Interventions SDOH Screenings   Food Insecurity: Food Insecurity Present (10/13/2023)  Housing: Low Risk  (10/13/2023)  Transportation Needs: Unmet Transportation Needs (10/13/2023)  Utilities: At Risk (10/13/2023)  Alcohol  Screen: Low Risk  (08/24/2021)  Depression (PHQ2-9): Low Risk  (02/07/2019)  Social Connections: Socially Isolated (10/13/2023)  Tobacco Use: Medium Risk (10/15/2023)    Readmission Risk Interventions    09/17/2023   12:02 PM  Readmission Risk Prevention Plan  Transportation Screening Complete  PCP or Specialist Appt within 3-5 Days Complete  HRI or Home Care Consult Complete  Social Work Consult for Recovery Care Planning/Counseling Complete  Palliative Care Screening Not Applicable  Medication Review Oceanographer) Complete

## 2023-10-28 ENCOUNTER — Other Ambulatory Visit: Payer: Self-pay | Admitting: Pulmonary Disease

## 2023-10-28 DIAGNOSIS — N183 Chronic kidney disease, stage 3 unspecified: Secondary | ICD-10-CM | POA: Diagnosis not present

## 2023-10-28 DIAGNOSIS — I130 Hypertensive heart and chronic kidney disease with heart failure and stage 1 through stage 4 chronic kidney disease, or unspecified chronic kidney disease: Secondary | ICD-10-CM | POA: Diagnosis not present

## 2023-10-28 DIAGNOSIS — I2510 Atherosclerotic heart disease of native coronary artery without angina pectoris: Secondary | ICD-10-CM | POA: Diagnosis not present

## 2023-10-28 DIAGNOSIS — J441 Chronic obstructive pulmonary disease with (acute) exacerbation: Secondary | ICD-10-CM | POA: Diagnosis not present

## 2023-10-28 DIAGNOSIS — J9621 Acute and chronic respiratory failure with hypoxia: Secondary | ICD-10-CM | POA: Diagnosis not present

## 2023-10-28 DIAGNOSIS — I5032 Chronic diastolic (congestive) heart failure: Secondary | ICD-10-CM | POA: Diagnosis not present

## 2023-10-28 DIAGNOSIS — H548 Legal blindness, as defined in USA: Secondary | ICD-10-CM | POA: Diagnosis not present

## 2023-10-28 DIAGNOSIS — N400 Enlarged prostate without lower urinary tract symptoms: Secondary | ICD-10-CM | POA: Diagnosis not present

## 2023-10-28 DIAGNOSIS — I480 Paroxysmal atrial fibrillation: Secondary | ICD-10-CM | POA: Diagnosis not present

## 2023-10-29 DIAGNOSIS — J441 Chronic obstructive pulmonary disease with (acute) exacerbation: Secondary | ICD-10-CM | POA: Diagnosis not present

## 2023-10-29 DIAGNOSIS — R609 Edema, unspecified: Secondary | ICD-10-CM | POA: Diagnosis not present

## 2023-10-29 DIAGNOSIS — G4700 Insomnia, unspecified: Secondary | ICD-10-CM | POA: Diagnosis not present

## 2023-10-29 DIAGNOSIS — J9611 Chronic respiratory failure with hypoxia: Secondary | ICD-10-CM | POA: Diagnosis not present

## 2023-10-29 DIAGNOSIS — Z09 Encounter for follow-up examination after completed treatment for conditions other than malignant neoplasm: Secondary | ICD-10-CM | POA: Diagnosis not present

## 2023-10-29 DIAGNOSIS — R1310 Dysphagia, unspecified: Secondary | ICD-10-CM | POA: Diagnosis not present

## 2023-10-31 DIAGNOSIS — R269 Unspecified abnormalities of gait and mobility: Secondary | ICD-10-CM | POA: Diagnosis not present

## 2023-11-05 ENCOUNTER — Encounter (HOSPITAL_COMMUNITY): Payer: Self-pay

## 2023-11-05 ENCOUNTER — Other Ambulatory Visit: Payer: Self-pay

## 2023-11-05 ENCOUNTER — Emergency Department (HOSPITAL_COMMUNITY)

## 2023-11-05 ENCOUNTER — Inpatient Hospital Stay (HOSPITAL_COMMUNITY)
Admission: EM | Admit: 2023-11-05 | Discharge: 2023-11-14 | DRG: 190 | Disposition: A | Discharge status: Home Health | Attending: Internal Medicine | Admitting: Internal Medicine

## 2023-11-05 DIAGNOSIS — I5032 Chronic diastolic (congestive) heart failure: Secondary | ICD-10-CM | POA: Diagnosis present

## 2023-11-05 DIAGNOSIS — R5381 Other malaise: Secondary | ICD-10-CM | POA: Diagnosis present

## 2023-11-05 DIAGNOSIS — J208 Acute bronchitis due to other specified organisms: Secondary | ICD-10-CM | POA: Diagnosis present

## 2023-11-05 DIAGNOSIS — D649 Anemia, unspecified: Secondary | ICD-10-CM | POA: Diagnosis not present

## 2023-11-05 DIAGNOSIS — I252 Old myocardial infarction: Secondary | ICD-10-CM | POA: Diagnosis not present

## 2023-11-05 DIAGNOSIS — J441 Chronic obstructive pulmonary disease with (acute) exacerbation: Secondary | ICD-10-CM | POA: Diagnosis present

## 2023-11-05 DIAGNOSIS — J9621 Acute and chronic respiratory failure with hypoxia: Secondary | ICD-10-CM | POA: Diagnosis present

## 2023-11-05 DIAGNOSIS — J9690 Respiratory failure, unspecified, unspecified whether with hypoxia or hypercapnia: Secondary | ICD-10-CM | POA: Diagnosis not present

## 2023-11-05 DIAGNOSIS — Z961 Presence of intraocular lens: Secondary | ICD-10-CM | POA: Diagnosis present

## 2023-11-05 DIAGNOSIS — K219 Gastro-esophageal reflux disease without esophagitis: Secondary | ICD-10-CM | POA: Diagnosis present

## 2023-11-05 DIAGNOSIS — E873 Alkalosis: Secondary | ICD-10-CM | POA: Diagnosis present

## 2023-11-05 DIAGNOSIS — D696 Thrombocytopenia, unspecified: Secondary | ICD-10-CM | POA: Diagnosis present

## 2023-11-05 DIAGNOSIS — I2510 Atherosclerotic heart disease of native coronary artery without angina pectoris: Secondary | ICD-10-CM | POA: Diagnosis present

## 2023-11-05 DIAGNOSIS — G629 Polyneuropathy, unspecified: Secondary | ICD-10-CM | POA: Diagnosis present

## 2023-11-05 DIAGNOSIS — Z8249 Family history of ischemic heart disease and other diseases of the circulatory system: Secondary | ICD-10-CM

## 2023-11-05 DIAGNOSIS — I130 Hypertensive heart and chronic kidney disease with heart failure and stage 1 through stage 4 chronic kidney disease, or unspecified chronic kidney disease: Secondary | ICD-10-CM | POA: Diagnosis present

## 2023-11-05 DIAGNOSIS — B9789 Other viral agents as the cause of diseases classified elsewhere: Secondary | ICD-10-CM | POA: Diagnosis present

## 2023-11-05 DIAGNOSIS — H548 Legal blindness, as defined in USA: Secondary | ICD-10-CM | POA: Diagnosis present

## 2023-11-05 DIAGNOSIS — R7303 Prediabetes: Secondary | ICD-10-CM | POA: Diagnosis present

## 2023-11-05 DIAGNOSIS — R918 Other nonspecific abnormal finding of lung field: Secondary | ICD-10-CM | POA: Diagnosis not present

## 2023-11-05 DIAGNOSIS — N1831 Chronic kidney disease, stage 3a: Secondary | ICD-10-CM | POA: Diagnosis present

## 2023-11-05 DIAGNOSIS — Z66 Do not resuscitate: Secondary | ICD-10-CM | POA: Diagnosis present

## 2023-11-05 DIAGNOSIS — J470 Bronchiectasis with acute lower respiratory infection: Secondary | ICD-10-CM | POA: Diagnosis present

## 2023-11-05 DIAGNOSIS — N400 Enlarged prostate without lower urinary tract symptoms: Secondary | ICD-10-CM | POA: Diagnosis present

## 2023-11-05 DIAGNOSIS — R0689 Other abnormalities of breathing: Secondary | ICD-10-CM | POA: Diagnosis not present

## 2023-11-05 DIAGNOSIS — Z7901 Long term (current) use of anticoagulants: Secondary | ICD-10-CM | POA: Diagnosis not present

## 2023-11-05 DIAGNOSIS — Z6830 Body mass index (BMI) 30.0-30.9, adult: Secondary | ICD-10-CM

## 2023-11-05 DIAGNOSIS — J479 Bronchiectasis, uncomplicated: Secondary | ICD-10-CM | POA: Diagnosis not present

## 2023-11-05 DIAGNOSIS — I700 Atherosclerosis of aorta: Secondary | ICD-10-CM | POA: Diagnosis not present

## 2023-11-05 DIAGNOSIS — R069 Unspecified abnormalities of breathing: Secondary | ICD-10-CM | POA: Diagnosis not present

## 2023-11-05 DIAGNOSIS — J206 Acute bronchitis due to rhinovirus: Secondary | ICD-10-CM | POA: Diagnosis not present

## 2023-11-05 DIAGNOSIS — A419 Sepsis, unspecified organism: Secondary | ICD-10-CM | POA: Diagnosis not present

## 2023-11-05 DIAGNOSIS — B9781 Human metapneumovirus as the cause of diseases classified elsewhere: Secondary | ICD-10-CM | POA: Diagnosis present

## 2023-11-05 DIAGNOSIS — Z9842 Cataract extraction status, left eye: Secondary | ICD-10-CM

## 2023-11-05 DIAGNOSIS — Z9841 Cataract extraction status, right eye: Secondary | ICD-10-CM

## 2023-11-05 DIAGNOSIS — Z7951 Long term (current) use of inhaled steroids: Secondary | ICD-10-CM

## 2023-11-05 DIAGNOSIS — Z604 Social exclusion and rejection: Secondary | ICD-10-CM | POA: Diagnosis present

## 2023-11-05 DIAGNOSIS — J449 Chronic obstructive pulmonary disease, unspecified: Secondary | ICD-10-CM | POA: Diagnosis not present

## 2023-11-05 DIAGNOSIS — Z87891 Personal history of nicotine dependence: Secondary | ICD-10-CM

## 2023-11-05 DIAGNOSIS — Z9981 Dependence on supplemental oxygen: Secondary | ICD-10-CM | POA: Diagnosis not present

## 2023-11-05 DIAGNOSIS — B965 Pseudomonas (aeruginosa) (mallei) (pseudomallei) as the cause of diseases classified elsewhere: Secondary | ICD-10-CM | POA: Diagnosis present

## 2023-11-05 DIAGNOSIS — R197 Diarrhea, unspecified: Secondary | ICD-10-CM | POA: Diagnosis not present

## 2023-11-05 DIAGNOSIS — Z5941 Food insecurity: Secondary | ICD-10-CM

## 2023-11-05 DIAGNOSIS — J440 Chronic obstructive pulmonary disease with acute lower respiratory infection: Secondary | ICD-10-CM | POA: Diagnosis present

## 2023-11-05 DIAGNOSIS — E66811 Obesity, class 1: Secondary | ICD-10-CM | POA: Diagnosis present

## 2023-11-05 DIAGNOSIS — E782 Mixed hyperlipidemia: Secondary | ICD-10-CM | POA: Diagnosis present

## 2023-11-05 DIAGNOSIS — E6609 Other obesity due to excess calories: Secondary | ICD-10-CM

## 2023-11-05 DIAGNOSIS — R1310 Dysphagia, unspecified: Secondary | ICD-10-CM | POA: Diagnosis present

## 2023-11-05 DIAGNOSIS — R062 Wheezing: Secondary | ICD-10-CM | POA: Diagnosis not present

## 2023-11-05 DIAGNOSIS — Z79899 Other long term (current) drug therapy: Secondary | ICD-10-CM

## 2023-11-05 DIAGNOSIS — R9389 Abnormal findings on diagnostic imaging of other specified body structures: Secondary | ICD-10-CM | POA: Insufficient documentation

## 2023-11-05 DIAGNOSIS — I480 Paroxysmal atrial fibrillation: Secondary | ICD-10-CM | POA: Diagnosis present

## 2023-11-05 DIAGNOSIS — Z955 Presence of coronary angioplasty implant and graft: Secondary | ICD-10-CM

## 2023-11-05 DIAGNOSIS — Z888 Allergy status to other drugs, medicaments and biological substances status: Secondary | ICD-10-CM

## 2023-11-05 LAB — CBC WITH DIFFERENTIAL/PLATELET
Abs Immature Granulocytes: 0.07 K/uL (ref 0.00–0.07)
Basophils Absolute: 0 K/uL (ref 0.0–0.1)
Basophils Relative: 0 %
Eosinophils Absolute: 0.1 K/uL (ref 0.0–0.5)
Eosinophils Relative: 1 %
HCT: 49.4 % (ref 39.0–52.0)
Hemoglobin: 15.1 g/dL (ref 13.0–17.0)
Immature Granulocytes: 1 %
Lymphocytes Relative: 17 %
Lymphs Abs: 1.5 K/uL (ref 0.7–4.0)
MCH: 28.2 pg (ref 26.0–34.0)
MCHC: 30.6 g/dL (ref 30.0–36.0)
MCV: 92.2 fL (ref 80.0–100.0)
Monocytes Absolute: 0.9 K/uL (ref 0.1–1.0)
Monocytes Relative: 11 %
Neutro Abs: 6 K/uL (ref 1.7–7.7)
Neutrophils Relative %: 70 %
Platelets: 111 K/uL — ABNORMAL LOW (ref 150–400)
RBC: 5.36 MIL/uL (ref 4.22–5.81)
RDW: 15 % (ref 11.5–15.5)
WBC: 8.6 K/uL (ref 4.0–10.5)
nRBC: 0 % (ref 0.0–0.2)

## 2023-11-05 LAB — PROTIME-INR
INR: 1.1 (ref 0.8–1.2)
Prothrombin Time: 14.8 s (ref 11.4–15.2)

## 2023-11-05 LAB — COMPREHENSIVE METABOLIC PANEL WITH GFR
ALT: 49 U/L — ABNORMAL HIGH (ref 0–44)
AST: 29 U/L (ref 15–41)
Albumin: 3 g/dL — ABNORMAL LOW (ref 3.5–5.0)
Alkaline Phosphatase: 69 U/L (ref 38–126)
Anion gap: 10 (ref 5–15)
BUN: 16 mg/dL (ref 8–23)
CO2: 33 mmol/L — ABNORMAL HIGH (ref 22–32)
Calcium: 9 mg/dL (ref 8.9–10.3)
Chloride: 97 mmol/L — ABNORMAL LOW (ref 98–111)
Creatinine, Ser: 0.91 mg/dL (ref 0.61–1.24)
GFR, Estimated: >60 mL/min (ref >60)
Glucose, Bld: 136 mg/dL — ABNORMAL HIGH (ref 70–99)
Potassium: 3.6 mmol/L (ref 3.5–5.1)
Sodium: 140 mmol/L (ref 135–145)
Total Bilirubin: 1 mg/dL (ref 0.0–1.2)
Total Protein: 6.3 g/dL — ABNORMAL LOW (ref 6.5–8.1)

## 2023-11-05 LAB — BLOOD GAS, VENOUS
Acid-Base Excess: 14.5 mmol/L — ABNORMAL HIGH (ref 0.0–2.0)
Bicarbonate: 40.8 mmol/L — ABNORMAL HIGH (ref 20.0–28.0)
O2 Saturation: 71.8 %
Patient temperature: 36.7
pCO2, Ven: 55 mmHg (ref 44–60)
pH, Ven: 7.48 — ABNORMAL HIGH (ref 7.25–7.43)
pO2, Ven: 40 mmHg (ref 32–45)

## 2023-11-05 LAB — RESP PANEL BY RT-PCR (RSV, FLU A&B, COVID)  RVPGX2
Influenza A by PCR: NEGATIVE
Influenza B by PCR: NEGATIVE
Resp Syncytial Virus by PCR: NEGATIVE
SARS Coronavirus 2 by RT PCR: NEGATIVE

## 2023-11-05 LAB — I-STAT CG4 LACTIC ACID, ED: Lactic Acid, Venous: 1.4 mmol/L (ref 0.5–1.9)

## 2023-11-05 LAB — BRAIN NATRIURETIC PEPTIDE: B Natriuretic Peptide: 32.3 pg/mL (ref 0.0–100.0)

## 2023-11-05 LAB — TROPONIN I (HIGH SENSITIVITY)
Troponin I (High Sensitivity): 7 ng/L (ref <18)
Troponin I (High Sensitivity): 7 ng/L (ref <18)

## 2023-11-05 LAB — MAGNESIUM: Magnesium: 3.1 mg/dL — ABNORMAL HIGH (ref 1.7–2.4)

## 2023-11-05 MED ORDER — ONDANSETRON HCL 4 MG PO TABS: 4.0000 mg | ORAL_TABLET | Freq: Four times a day (QID) | ORAL | Status: DC | PRN

## 2023-11-05 MED ORDER — IPRATROPIUM-ALBUTEROL 0.5-2.5 (3) MG/3ML IN SOLN
3.0000 mL | Freq: Four times a day (QID) | RESPIRATORY_TRACT | Status: DC
  Administered 2023-11-05 – 2023-11-13 (×31): 3 mL via RESPIRATORY_TRACT
  Filled 2023-11-05 (×31): qty 3

## 2023-11-05 MED ORDER — PRAVASTATIN SODIUM 20 MG PO TABS
20.0000 mg | ORAL_TABLET | Freq: Every evening | ORAL | Status: DC
  Administered 2023-11-06 – 2023-11-13 (×8): 20 mg via ORAL
  Filled 2023-11-05 (×8): qty 1

## 2023-11-05 MED ORDER — METHYLPREDNISOLONE SODIUM SUCC 40 MG IJ SOLR
40.0000 mg | Freq: Two times a day (BID) | INTRAMUSCULAR | Status: DC
  Administered 2023-11-06 – 2023-11-12 (×13): 40 mg via INTRAVENOUS
  Filled 2023-11-05 (×13): qty 1

## 2023-11-05 MED ORDER — IBUPROFEN 200 MG PO TABS
400.0000 mg | ORAL_TABLET | Freq: Four times a day (QID) | ORAL | Status: DC | PRN
  Administered 2023-11-06 – 2023-11-14 (×2): 400 mg via ORAL
  Filled 2023-11-05 (×2): qty 2

## 2023-11-05 MED ORDER — METOPROLOL TARTRATE 25 MG PO TABS
25.0000 mg | ORAL_TABLET | Freq: Two times a day (BID) | ORAL | Status: DC
  Administered 2023-11-05 – 2023-11-14 (×18): 25 mg via ORAL
  Filled 2023-11-05 (×18): qty 1

## 2023-11-05 MED ORDER — APIXABAN 2.5 MG PO TABS
5.0000 mg | ORAL_TABLET | Freq: Two times a day (BID) | ORAL | Status: DC
  Administered 2023-11-05 – 2023-11-14 (×18): 5 mg via ORAL
  Filled 2023-11-05 (×18): qty 2

## 2023-11-05 MED ORDER — ENSURE PLUS HIGH PROTEIN PO LIQD
1.0000 | Freq: Two times a day (BID) | ORAL | Status: DC
  Administered 2023-11-06: 237 mL via ORAL

## 2023-11-05 MED ORDER — FINASTERIDE 5 MG PO TABS
5.0000 mg | ORAL_TABLET | Freq: Every morning | ORAL | Status: DC
  Administered 2023-11-06 – 2023-11-14 (×9): 5 mg via ORAL
  Filled 2023-11-05 (×9): qty 1

## 2023-11-05 MED ORDER — GABAPENTIN 100 MG PO CAPS
100.0000 mg | ORAL_CAPSULE | Freq: Once | ORAL | Status: AC
  Administered 2023-11-05: 100 mg via ORAL
  Filled 2023-11-05: qty 1

## 2023-11-05 MED ORDER — ONDANSETRON HCL 4 MG/2ML IJ SOLN: 4.0000 mg | Freq: Four times a day (QID) | INTRAMUSCULAR | Status: DC | PRN

## 2023-11-05 MED ORDER — GUAIFENESIN ER 600 MG PO TB12
600.0000 mg | ORAL_TABLET | Freq: Two times a day (BID) | ORAL | Status: DC
  Administered 2023-11-05 – 2023-11-14 (×16): 600 mg via ORAL
  Filled 2023-11-05 (×18): qty 1

## 2023-11-05 MED ORDER — PANTOPRAZOLE SODIUM 40 MG PO TBEC
40.0000 mg | DELAYED_RELEASE_TABLET | Freq: Every day | ORAL | Status: DC
  Administered 2023-11-06 – 2023-11-14 (×9): 40 mg via ORAL
  Filled 2023-11-05 (×9): qty 1

## 2023-11-05 MED ORDER — ALBUTEROL SULFATE (2.5 MG/3ML) 0.083% IN NEBU
2.5000 mg | INHALATION_SOLUTION | RESPIRATORY_TRACT | Status: DC | PRN
  Administered 2023-11-05 – 2023-11-13 (×2): 2.5 mg via RESPIRATORY_TRACT
  Filled 2023-11-05 (×2): qty 3

## 2023-11-05 MED ORDER — QUETIAPINE FUMARATE 50 MG PO TABS
50.0000 mg | ORAL_TABLET | Freq: Every day | ORAL | Status: DC
  Administered 2023-11-05 – 2023-11-13 (×9): 50 mg via ORAL
  Filled 2023-11-05 (×9): qty 1

## 2023-11-05 NOTE — ED Notes (Signed)
 RT at bedside for breathing treatment.

## 2023-11-05 NOTE — ED Notes (Signed)
 Provided sandwich and something to drink per patients request

## 2023-11-05 NOTE — H&P (Signed)
 History and Physical  HUY MAJID FMW:[id number] DOB: [birth year] DOA: 11/05/2023  PCP: Okey Carlin Redbird, MD   Chief Complaint: Cough, shortness of breath  HPI: Mitchell [name] is a 83 y.o. male with medical history significant for COPD chronically on 2 L nasal cannula oxygen , paroxysmal atrial fibrillation on Eliquis , CKD stage III, legal blindness recently discharged from [facility] after stay for acute exacerbation of COPD being admitted to the hospital with progressive shortness of breath likely due to recurrent acute exacerbation of COPD.  Patient states that he feels he was quite short of breath and dyspneic with exertion when he was discharged from the hospital on 7/11.  Sounds like he was doing decently well at home, he had home health.  However he feels that he has gradual worsening of his shortness of breath over the last week or so, to the point that last night he was hardly able to sleep at all, with constant cough and feeling short of breath.  He called EMS this morning.  Apparently he treated himself with multiple DuoNebs overnight, they did not seem to help much.  EMS notes initial saturation of 82% on his baseline 2 L nasal cannula oxygen .  EMS gave him DuoNeb, magnesium , IV Solu-Medrol  and epi to help with his breathing, patient denies any fevers, chills, he has a wet nonproductive cough.  He denies any nausea or vomiting.  States he has been having some diarrhea since discharge from the hospital.  Review of Systems: Please see HPI for pertinent positives and negatives. A complete 10 system review of systems are otherwise negative.  Past Medical History:  Diagnosis Date   Anxiety    BPH (benign prostatic hypertrophy)    CAD (coronary artery disease) 2007   with LAD/Diag CBPTCA and kissing    CKD (chronic kidney disease), stage III (HCC)    thelbert 01/04/2017   COPD (chronic obstructive pulmonary disease) (HCC)    maybe (02/14/2012)   Depression    GERD (gastroesophageal reflux  disease)    Hyperlipidemia    Hypertension    Hypoxemia    Myocardial infarction (HCC) 01/22/2012   NSTEMI with DES to RSA (PDA)   On home oxygen  therapy    Shortness of breath    walking and laying down sometimes (02/14/2012)   Past Surgical History:  Procedure Laterality Date   CATARACT EXTRACTION W/ INTRAOCULAR LENS  IMPLANT, BILATERAL  ~ 2010   CORONARY ANGIOPLASTY WITH STENT PLACEMENT  05/11/2005   PCI OF LAD   GANGLION CYST REMOVED  1956?   right   LEFT HEART CATHETERIZATION WITH CORONARY ANGIOGRAM Bilateral 01/24/2012   Procedure: LEFT HEART CATHETERIZATION WITH CORONARY ANGIOGRAM;  Surgeon: Victory LELON Claudene DOUGLAS, MD;  Location: [facility] CATH LAB;  Service: Cardiovascular;  Laterality: Bilateral;   REFRACTIVE SURGERY  2010   left   Social History:  reports that he quit smoking about 12 years ago. His smoking use included pipe. He has never used smokeless tobacco. He reports that he does not currently use alcohol  after a past usage of about 7.0 standard drinks of alcohol  per week. He reports that he does not use drugs.  Allergies  Allergen Reactions   Atorvastatin  Other (See Comments)    Severe myalgias- just paralyzed me; I can't take it   Crestor  [Rosuvastatin ] Other (See Comments)    Myalgias    Norco [Hydrocodone -Acetaminophen ] Hives    Family History  Problem Relation Age of Onset   Heart failure Mother    Heart  failure Brother      Prior to Admission medications   Medication Sig Start Date End Date Taking? Authorizing Provider  albuterol  (PROVENTIL  HFA;VENTOLIN  HFA) 108 (90 BASE) MCG/ACT inhaler Inhale 2 puffs into the lungs every 4 (four) hours as needed for wheezing or shortness of breath.    [provider]  albuterol  (PROVENTIL ) (2.5 MG/3ML) 0.083% nebulizer solution INHALE 3 ML EVERY 6 HOURS AS NEEDED FOR 30 DAYS 10/28/23   Kara Dorn NOVAK, MD  allopurinol  (ZYLOPRIM ) 100 MG tablet Take 100 mg by mouth in the morning. 11/07/20   [provider]   apixaban  (ELIQUIS ) 5 MG TABS tablet Take 1 tablet (5 mg total) by mouth 2 (two) times daily. 10/09/23   Tolia, Sunit, DO  benzonatate  (TESSALON ) 100 MG capsule Take 1 capsule (100 mg total) by mouth 3 (three) times daily as needed for cough. 10/25/23   Cindy Garnette POUR, MD  budesonide -glycopyrrolate -formoterol  (BREZTRI ) 160-9-4.8 MCG/ACT AERO inhaler Inhale 2 puffs into the lungs 2 (two) times daily. 09/17/23   Lue Elsie BROCKS, MD  colchicine  0.6 MG tablet Take 1 tablet (0.6 mg total) by mouth 2 (two) times daily. 10/25/23 11/24/23  Cindy Garnette POUR, MD  dextromethorphan  (DELSYM ) 30 MG/5ML liquid Take 2.5 mLs (15 mg total) by mouth 2 (two) times daily. 10/25/23   Cindy Garnette POUR, MD  docusate sodium  (COLACE) 100 MG capsule Take 1 capsule (100 mg total) by mouth daily. 10/25/23   Cindy Garnette POUR, MD  feeding supplement (ENSURE PLUS HIGH PROTEIN) LIQD Take 237 mLs by mouth 2 (two) times daily between meals. 09/17/23   Lue Elsie BROCKS, MD  finasteride  (PROSCAR ) 5 MG tablet Take 5 mg by mouth in the morning. Reported on 05/04/2015    [provider]  furosemide  (LASIX ) 40 MG tablet Take 40 mg by mouth in the morning.    [provider]  gabapentin  (NEURONTIN ) 300 MG capsule Take 600 mg by mouth at bedtime. Patient not taking: Reported on 10/13/2023    [provider]  guaiFENesin  (MUCINEX ) 600 MG 12 hr tablet Take 1 tablet (600 mg total) by mouth 2 (two) times daily. 10/25/23   Cindy Garnette POUR, MD  ipratropium-albuterol  (DUONEB) 0.5-2.5 (3) MG/3ML SOLN Take 3 mLs by nebulization every 4 (four) hours as needed (shortness of breath/wheeze). 09/17/23 10/17/23  Lue Elsie BROCKS, MD  metoprolol  tartrate (LOPRESSOR ) 25 MG tablet TAKE 1 TABLET TWICE DAILY Patient taking differently: Take 25 mg by mouth in the morning and at bedtime. 03/22/20   Claudene Victory ORN, MD  nitroGLYCERIN  (NITROSTAT ) 0.4 MG SL tablet Place 0.4 mg under the tongue every 5 (five) minutes as needed for chest pain.     [provider]  OXYGEN  Inhale 2-3 L/min into the lungs continuous.    [provider]  pantoprazole  (PROTONIX ) 40 MG tablet Take 1 tablet (40 mg total) by mouth daily at 6 (six) AM. 10/26/23   Cindy Garnette POUR, MD  pravastatin  (PRAVACHOL ) 20 MG tablet Take 1 tablet (20 mg total) by mouth every evening. Patient taking differently: Take 20 mg by mouth daily. 11/17/21   Claudene Victory ORN, MD  QUEtiapine  (SEROQUEL ) 50 MG tablet Take 1 tablet (50 mg total) by mouth at bedtime. 09/17/23   Lue Elsie BROCKS, MD  TYLENOL  325 MG tablet Take 325-650 mg by mouth every 6 (six) hours as needed for mild pain (pain score 1-3) or headache.    [provider]    Physical Exam: BP (!) 140/75  Pulse (!) 111   Temp 98 F (36.7 C) (Oral)   Resp (!) 24   Ht 6' (1.829 m)   Wt 103 kg   SpO2 94%   BMI 30.79 kg/m  General:  Alert, oriented, calm, in no acute distress, currently wearing 3 L nasal cannula oxygen .  Patient is speaking in full sentences with no evidence of respiratory distress. Cardiovascular: RRR, no murmurs or rubs, no peripheral edema  Respiratory: Breath sounds are distant bilaterally, he has very minimal tachypnea, no wheezing, no crackles, some diffuse rhonchi is present Abdomen: soft, nontender, nondistended, normal bowel tones heard  Skin: dry, no rashes  Musculoskeletal: no joint effusions, normal range of motion  Psychiatric: appropriate affect, normal speech  Neurologic: extraocular muscles intact, clear speech, moving all extremities with intact sensorium         Labs on Admission:  Basic Metabolic Panel: Recent Labs  Lab 11/05/23 1103  NA 140  K 3.6  CL 97*  CO2 33*  GLUCOSE 136*  BUN 16  CREATININE 0.91  CALCIUM  9.0  MG 3.1*   Liver Function Tests: Recent Labs  Lab 11/05/23 1103  AST 29  ALT 49*  ALKPHOS 69  BILITOT 1.0  PROT 6.3*  ALBUMIN 3.0*   No results for input(s): LIPASE, AMYLASE in the last 168 hours. No results for  input(s): AMMONIA in the last 168 hours. CBC: Recent Labs  Lab 11/05/23 1103  WBC 8.6  NEUTROABS 6.0  HGB 15.1  HCT 49.4  MCV 92.2  PLT 111*   Cardiac Enzymes: No results for input(s): CKTOTAL, CKMB, CKMBINDEX, TROPONINI in the last 168 hours. BNP (last 3 results) Recent Labs    09/13/23 1820 10/12/23 2142 11/05/23 1104  BNP 85.7 41.1 32.3    ProBNP (last 3 results) Recent Labs    01/15/23 0949  PROBNP 288    CBG: No results for input(s): GLUCAP in the last 168 hours.  Radiological Exams on Admission: DG Chest Port 1 View Result Date: 11/05/2023 CLINICAL DATA:  Sepsis EXAM: PORTABLE CHEST 1 VIEW COMPARISON:  10/19/2023 FINDINGS: Atherosclerotic calcification of the aortic arch. The lungs appear clear. No blunting of the costophrenic angles. Heart size within normal limits. Mild fullness of the right upper mediastinal region or should be did to mediastinal adipose tissues based on correlation with chest CT of 01/12/2022. IMPRESSION: 1. No acute findings. 2. Aortic Atherosclerosis (ICD10-I70.0). Electronically Signed   By: Ryan Salvage M.D.   On: 11/05/2023 11:33   Assessment/Plan TZVI ECONOMOU is a 83 y.o. male with medical history significant for COPD chronically on 2 L nasal cannula oxygen , paroxysmal atrial fibrillation on Eliquis , CKD stage III, legal blindness recently discharged from [facility] after stay for acute exacerbation of COPD being admitted to the hospital with progressive shortness of breath likely due to recurrent acute exacerbation of COPD.   Acute exacerbation of COPD-with continued cough, acute on chronic hypoxic respiratory failure.  No evidence of acute bacterial pneumonia or other infection. -Observation admission -Scheduled DuoNeb -As needed albuterol  neb -Incentive spirometer -Flutter valve -IV Solu-Medrol  -Mucinex   Acute on chronic hypoxic respiratory failure-due to likely to acute exacerbation of COPD -Supplemental oxygen  -Treat  acute exacerbation of COPD as above -Wean oxygen  as able, with goal O2 saturation greater than 89%  Paroxysmal atrial fibrillation-continue Toprol , and Eliquis   BPH-Flomax   Hyperlipidemia-Pravachol   GERD-Protonix     Code Status: Limited: Do not attempt resuscitation (DNR) -DNR-LIMITED -Do Not Intubate/DNI   Consults called: None  Admission status: Observation  Time spent: 56 minutes  Jacilyn Sanpedro CHRISTELLA Gail MD [facility] Pager [phone]  If 7PM-7AM, please contact night-coverage [email or web address] Password [email or web address]  11/05/2023, 1:48 PM

## 2023-11-05 NOTE — ED Notes (Signed)
 Redraw on venous blood gas

## 2023-11-05 NOTE — ED Provider Notes (Signed)
 [place] EMERGENCY DEPARTMENT AT [facility] Provider Note   CSN: [id number] Arrival date & time: 11/05/23  1047     Patient presents with: Shortness of Breath   Mitchell [name] is a 83 y.o. male.  He is presenting by EMS from home for worsening shortness of breath.  He has a history of COPD and CHF and is normally on 2 L nasal cannula.  He said since discharge about 10 days ago he has been getting progressively more short of breath.  He has had a nonproductive cough.  No fever.  He also endorses loose stools since discharge.  Has been trying some Vicks rub and cough medicine since discharge without improvement.  EMS found him satting 82% and gave him DuoNeb magnesium  Solu-Medrol  and epi.    Shortness of Breath Severity:  Severe Onset quality:  Gradual Duration:  1 week Timing:  Constant Progression:  Worsening Chronicity:  Recurrent Relieved by:  Nothing Worsened by:  Activity and coughing Ineffective treatments:  Inhaler, rest and oxygen  Associated symptoms: cough   Associated symptoms: no abdominal pain, no chest pain, no fever, no hemoptysis, no sputum production and no vomiting   Risk factors: no tobacco use        Prior to Admission medications   Medication Sig Start Date End Date Taking? Authorizing Provider  albuterol  (PROVENTIL  HFA;VENTOLIN  HFA) 108 (90 BASE) MCG/ACT inhaler Inhale 2 puffs into the lungs every 4 (four) hours as needed for wheezing or shortness of breath.    [provider]  albuterol  (PROVENTIL ) (2.5 MG/3ML) 0.083% nebulizer solution INHALE 3 ML EVERY 6 HOURS AS NEEDED FOR 30 DAYS 10/28/23   Kara Dorn NOVAK, MD  allopurinol  (ZYLOPRIM ) 100 MG tablet Take 100 mg by mouth in the morning. 11/07/20   [provider]  apixaban  (ELIQUIS ) 5 MG TABS tablet Take 1 tablet (5 mg total) by mouth 2 (two) times daily. 10/09/23   Tolia, Sunit, DO  benzonatate  (TESSALON ) 100 MG capsule Take 1 capsule (100 mg total) by mouth 3 (three) times  daily as needed for cough. 10/25/23   Cindy Garnette POUR, MD  budesonide -glycopyrrolate -formoterol  (BREZTRI ) 160-9-4.8 MCG/ACT AERO inhaler Inhale 2 puffs into the lungs 2 (two) times daily. 09/17/23   Lue Elsie BROCKS, MD  colchicine  0.6 MG tablet Take 1 tablet (0.6 mg total) by mouth 2 (two) times daily. 10/25/23 11/24/23  Cindy Garnette POUR, MD  dextromethorphan  (DELSYM ) 30 MG/5ML liquid Take 2.5 mLs (15 mg total) by mouth 2 (two) times daily. 10/25/23   Cindy Garnette POUR, MD  docusate sodium  (COLACE) 100 MG capsule Take 1 capsule (100 mg total) by mouth daily. 10/25/23   Cindy Garnette POUR, MD  feeding supplement (ENSURE PLUS HIGH PROTEIN) LIQD Take 237 mLs by mouth 2 (two) times daily between meals. 09/17/23   Lue Elsie BROCKS, MD  finasteride  (PROSCAR ) 5 MG tablet Take 5 mg by mouth in the morning. Reported on 05/04/2015    [provider]  furosemide  (LASIX ) 40 MG tablet Take 40 mg by mouth in the morning.    [provider]  gabapentin  (NEURONTIN ) 300 MG capsule Take 600 mg by mouth at bedtime. Patient not taking: Reported on 10/13/2023    [provider]  guaiFENesin  (MUCINEX ) 600 MG 12 hr tablet Take 1 tablet (600 mg total) by mouth 2 (two) times daily. 10/25/23   Cindy Garnette POUR, MD  ipratropium-albuterol  (DUONEB) 0.5-2.5 (3) MG/3ML SOLN Take 3 mLs by nebulization every 4 (four) hours as needed (  shortness of breath/wheeze). 09/17/23 10/17/23  Lue Elsie BROCKS, MD  metoprolol  tartrate (LOPRESSOR ) 25 MG tablet TAKE 1 TABLET TWICE DAILY Patient taking differently: Take 25 mg by mouth in the morning and at bedtime. 03/22/20   Claudene Victory ORN, MD  nitroGLYCERIN  (NITROSTAT ) 0.4 MG SL tablet Place 0.4 mg under the tongue every 5 (five) minutes as needed for chest pain.    [provider]  OXYGEN  Inhale 2-3 L/min into the lungs continuous.    [provider]  pantoprazole  (PROTONIX ) 40 MG tablet Take 1 tablet (40 mg total) by mouth daily at 6 (six) AM. 10/26/23   Cindy Garnette POUR, MD  pravastatin  (PRAVACHOL ) 20 MG tablet Take 1 tablet (20 mg total) by mouth every evening. Patient taking differently: Take 20 mg by mouth daily. 11/17/21   Claudene Victory ORN, MD  QUEtiapine  (SEROQUEL ) 50 MG tablet Take 1 tablet (50 mg total) by mouth at bedtime. 09/17/23   Lue Elsie BROCKS, MD  TYLENOL  325 MG tablet Take 325-650 mg by mouth every 6 (six) hours as needed for mild pain (pain score 1-3) or headache.    [provider]    Allergies: Atorvastatin , Crestor  [rosuvastatin ], and Norco [hydrocodone -acetaminophen ]    Review of Systems  Constitutional:  Negative for fever.  Respiratory:  Positive for cough and shortness of breath. Negative for hemoptysis and sputum production.   Cardiovascular:  Negative for chest pain.  Gastrointestinal:  Positive for diarrhea. Negative for abdominal pain and vomiting.    Updated Vital Signs BP 136/85 (BP Location: Left Arm)   Pulse (!) 106   Temp 98.5 F (36.9 C) (Oral)   Resp 19   Ht 6' (1.829 m)   Wt 103 kg   SpO2 100%   BMI 30.79 kg/m   Physical Exam Vitals and nursing note reviewed.  Constitutional:      General: He is in acute distress.     Appearance: He is well-developed.  HENT:     Head: Normocephalic and atraumatic.  Eyes:     Conjunctiva/sclera: Conjunctivae normal.  Cardiovascular:     Rate and Rhythm: Regular rhythm. Tachycardia present.     Heart sounds: No murmur heard. Pulmonary:     Effort: Tachypnea and accessory muscle usage present. No respiratory distress.     Breath sounds: Rhonchi present.  Abdominal:     Palpations: Abdomen is soft.     Tenderness: There is no abdominal tenderness.  Musculoskeletal:        General: No swelling. Normal range of motion.     Cervical back: Neck supple.     Right lower leg: No tenderness. Edema present.     Left lower leg: No tenderness. Edema present.  Skin:    General: Skin is warm and dry.     Capillary Refill: Capillary refill takes less than 2  seconds.  Neurological:     General: No focal deficit present.     Mental Status: He is alert.     (all labs ordered are listed, but only abnormal results are displayed) Labs Reviewed  COMPREHENSIVE METABOLIC PANEL WITH GFR - Abnormal; Notable for the following components:      Result Value   Chloride 97 (*)    CO2 33 (*)    Glucose, Bld 136 (*)    Total Protein 6.3 (*)    Albumin 3.0 (*)    ALT 49 (*)    All other components within normal limits  CBC WITH DIFFERENTIAL/PLATELET - Abnormal; Notable  for the following components:   Platelets 111 (*)    All other components within normal limits  BLOOD GAS, VENOUS - Abnormal; Notable for the following components:   pH, Ven 7.48 (*)    Bicarbonate 40.8 (*)    Acid-Base Excess 14.5 (*)    All other components within normal limits  MAGNESIUM  - Abnormal; Notable for the following components:   Magnesium  3.1 (*)    All other components within normal limits  RESP PANEL BY RT-PCR (RSV, FLU A&B, COVID)  [id number]  CULTURE, BLOOD (ROUTINE X 2)  CULTURE, BLOOD (ROUTINE X 2)  GASTROINTESTINAL PANEL BY PCR, STOOL (REPLACES STOOL CULTURE)  C DIFFICILE QUICK SCREEN W PCR REFLEX    PROTIME-INR  BRAIN NATRIURETIC PEPTIDE  URINALYSIS, W/ REFLEX TO CULTURE (INFECTION SUSPECTED)  BASIC METABOLIC PANEL WITH GFR  CBC  I-STAT CG4 LACTIC ACID, ED  TROPONIN I (HIGH SENSITIVITY)  TROPONIN I (HIGH SENSITIVITY)    EKG: EKG Interpretation Date/Time:  Tuesday November 05 2023 10:57:26 EDT Ventricular Rate:  104 PR Interval:  186 QRS Duration:  84 QT Interval:  335 QTC Calculation: 441 R Axis:   47  Text Interpretation: Sinus tachycardia rate increased from prior 6/25 Confirmed by Towana Sharper [phone]) on 11/05/2023 11:12:51 AM  Radiology: [facility] Chest Port 1 View Result Date: 11/05/2023 CLINICAL DATA:  Sepsis EXAM: PORTABLE CHEST 1 VIEW COMPARISON:  10/19/2023 FINDINGS: Atherosclerotic calcification of the aortic arch. The lungs appear clear. No blunting  of the costophrenic angles. Heart size within normal limits. Mild fullness of the right upper mediastinal region or should be did to mediastinal adipose tissues based on correlation with chest CT of 01/12/2022. IMPRESSION: 1. No acute findings. 2. Aortic Atherosclerosis (ICD10-I70.0). Electronically Signed   By: Ryan Salvage M.D.   On: 11/05/2023 11:33     .Critical Care  Performed by: Towana Sharper BROCKS, MD Authorized by: Towana Sharper BROCKS, MD   Critical care provider statement:    Critical care time (minutes):  30   Critical care time was exclusive of:  Separately billable procedures and treating other patients   Critical care was necessary to treat or prevent imminent or life-threatening deterioration of the following conditions:  Respiratory failure   Critical care was time spent personally by me on the following activities:  Development of treatment plan with patient or surrogate, discussions with consultants, evaluation of patient's response to treatment, examination of patient, ordering and review of laboratory studies, ordering and review of radiographic studies, ordering and performing treatments and interventions, pulse oximetry, re-evaluation of patient's condition and review of old charts    Medications Ordered in the ED  metoprolol  tartrate (LOPRESSOR ) tablet 25 mg (25 mg Oral Not Given 11/05/23 1547)  pravastatin  (PRAVACHOL ) tablet 20 mg (20 mg Oral Not Given 11/05/23 1738)  QUEtiapine  (SEROQUEL ) tablet 50 mg (has no administration in time range)  pantoprazole  (PROTONIX ) EC tablet 40 mg (has no administration in time range)  finasteride  (PROSCAR ) tablet 5 mg (has no administration in time range)  apixaban  (ELIQUIS ) tablet 5 mg (5 mg Oral Not Given 11/05/23 1547)  feeding supplement (ENSURE PLUS HIGH PROTEIN) liquid 237 mL (237 mLs Oral Not Given 11/05/23 1547)  guaiFENesin  (MUCINEX ) 12 hr tablet 600 mg (600 mg Oral Not Given 11/05/23 1547)  ibuprofen  (ADVIL ) tablet 400 mg (has no  administration in time range)  ondansetron  (ZOFRAN ) tablet 4 mg (has no administration in time range)    Or  ondansetron  (ZOFRAN ) injection 4 mg (has no administration in  time range)  albuterol  (PROVENTIL ) (2.5 MG/3ML) 0.083% nebulizer solution 2.5 mg (has no administration in time range)  ipratropium-albuterol  (DUONEB) 0.5-2.5 (3) MG/3ML nebulizer solution 3 mL (3 mLs Nebulization Given 11/05/23 1502)  methylPREDNISolone  sodium succinate (SOLU-MEDROL ) 40 mg/mL injection 40 mg (has no administration in time range)    Clinical Course as of 11/05/23 1754  Tue Nov 05, 2023  1115 Chest x-ray interpreted by me as no acute infiltrate.  Awaiting radiology reading. [MB]  1341 Discussed with [facility] hospitalist Dr. Roxane who will evaluate patient for admission [MB]    Clinical Course User Index [MB] Towana Ozell BROCKS, MD                                 Medical Decision Making Amount and/or Complexity of Data Reviewed Labs: ordered. Radiology: ordered.  Risk Decision regarding hospitalization.   This patient complains of worsening shortness of breath hypoxia weakness diarrhea; this involves an extensive number of treatment Options and is a complaint that carries with it a high risk of complications and morbidity. The differential includes COPD, CHF fluid pneumonia, C. difficile, PE  I ordered, reviewed and interpreted labs, which included CBC with new low platelets, chemistries unremarkable, troponin flat, BNP normal, COVID flu negative, lactate normal, stool studies ordered I ordered medication oxygen  and breathing treatments and reviewed PMP when indicated. I ordered imaging studies which included chest x-ray and I independently    visualized and interpreted imaging which showed no acute findings Additional history obtained from EMS Previous records obtained and reviewed in epic including recent discharge summary I consulted [facility] hospitalist Dr. Roxane and discussed lab and imaging  findings and discussed disposition.  Cardiac monitoring reviewed, sinus rhythm Social determinants considered, barriers including food insecurity transportation and utility insecurity social isolation Critical Interventions: Initiation of oxygen  and breathing treatments for patient's hypoxia  After the interventions stated above, I reevaluated the patient and found patient's work of breathing somewhat improved although not back to baseline Admission and further testing considered, he would benefit from mission to the hospital for further management.  Patient in agreement with plan for admission.      Final diagnoses:  COPD exacerbation (HCC)  Thrombocytopenia (HCC)  Acute diarrhea    ED Discharge Orders     None          Towana Ozell BROCKS, MD 11/05/23 1758

## 2023-11-05 NOTE — ED Notes (Signed)
 Brought patient bedside commode bc he reported he needed to have a BM.  Once everything set up patient reported he didn't need to go anymore.

## 2023-11-05 NOTE — ED Triage Notes (Signed)
 BIB EMS after calling for SOB. Initial sats 82%  Patient treated self with 3 duonebs at home.  EMS gaive duoneb, mag, solumedrol and epi to help with breathing. Patient is on 3L [place] at home baseline.  Patient reports he was here recently and MD told him he couldn't do anything for him so he has been at home treatment his congested cough with vick vapor rub and cough medicine.  Patient has wet cough but non productive.

## 2023-11-06 DIAGNOSIS — J206 Acute bronchitis due to rhinovirus: Secondary | ICD-10-CM | POA: Diagnosis not present

## 2023-11-06 DIAGNOSIS — R197 Diarrhea, unspecified: Secondary | ICD-10-CM | POA: Diagnosis not present

## 2023-11-06 DIAGNOSIS — K219 Gastro-esophageal reflux disease without esophagitis: Secondary | ICD-10-CM | POA: Diagnosis present

## 2023-11-06 DIAGNOSIS — J470 Bronchiectasis with acute lower respiratory infection: Secondary | ICD-10-CM | POA: Diagnosis present

## 2023-11-06 DIAGNOSIS — I2510 Atherosclerotic heart disease of native coronary artery without angina pectoris: Secondary | ICD-10-CM | POA: Diagnosis present

## 2023-11-06 DIAGNOSIS — D696 Thrombocytopenia, unspecified: Secondary | ICD-10-CM | POA: Diagnosis present

## 2023-11-06 DIAGNOSIS — B965 Pseudomonas (aeruginosa) (mallei) (pseudomallei) as the cause of diseases classified elsewhere: Secondary | ICD-10-CM | POA: Diagnosis present

## 2023-11-06 DIAGNOSIS — E66811 Obesity, class 1: Secondary | ICD-10-CM | POA: Diagnosis present

## 2023-11-06 DIAGNOSIS — Z66 Do not resuscitate: Secondary | ICD-10-CM | POA: Diagnosis present

## 2023-11-06 DIAGNOSIS — J9621 Acute and chronic respiratory failure with hypoxia: Secondary | ICD-10-CM | POA: Diagnosis present

## 2023-11-06 DIAGNOSIS — E782 Mixed hyperlipidemia: Secondary | ICD-10-CM

## 2023-11-06 DIAGNOSIS — H548 Legal blindness, as defined in USA: Secondary | ICD-10-CM | POA: Diagnosis present

## 2023-11-06 DIAGNOSIS — I5032 Chronic diastolic (congestive) heart failure: Secondary | ICD-10-CM | POA: Diagnosis present

## 2023-11-06 DIAGNOSIS — R7303 Prediabetes: Secondary | ICD-10-CM | POA: Diagnosis present

## 2023-11-06 DIAGNOSIS — J441 Chronic obstructive pulmonary disease with (acute) exacerbation: Secondary | ICD-10-CM | POA: Diagnosis not present

## 2023-11-06 DIAGNOSIS — Z7951 Long term (current) use of inhaled steroids: Secondary | ICD-10-CM | POA: Diagnosis not present

## 2023-11-06 DIAGNOSIS — I252 Old myocardial infarction: Secondary | ICD-10-CM | POA: Diagnosis not present

## 2023-11-06 DIAGNOSIS — I130 Hypertensive heart and chronic kidney disease with heart failure and stage 1 through stage 4 chronic kidney disease, or unspecified chronic kidney disease: Secondary | ICD-10-CM | POA: Diagnosis present

## 2023-11-06 DIAGNOSIS — Z7901 Long term (current) use of anticoagulants: Secondary | ICD-10-CM | POA: Diagnosis not present

## 2023-11-06 DIAGNOSIS — N1831 Chronic kidney disease, stage 3a: Secondary | ICD-10-CM | POA: Diagnosis present

## 2023-11-06 DIAGNOSIS — I480 Paroxysmal atrial fibrillation: Secondary | ICD-10-CM | POA: Diagnosis present

## 2023-11-06 DIAGNOSIS — J449 Chronic obstructive pulmonary disease, unspecified: Secondary | ICD-10-CM | POA: Diagnosis not present

## 2023-11-06 DIAGNOSIS — Z9981 Dependence on supplemental oxygen: Secondary | ICD-10-CM | POA: Diagnosis not present

## 2023-11-06 DIAGNOSIS — E873 Alkalosis: Secondary | ICD-10-CM | POA: Diagnosis present

## 2023-11-06 DIAGNOSIS — J440 Chronic obstructive pulmonary disease with acute lower respiratory infection: Secondary | ICD-10-CM | POA: Diagnosis present

## 2023-11-06 DIAGNOSIS — B9781 Human metapneumovirus as the cause of diseases classified elsewhere: Secondary | ICD-10-CM | POA: Diagnosis present

## 2023-11-06 DIAGNOSIS — J208 Acute bronchitis due to other specified organisms: Secondary | ICD-10-CM | POA: Diagnosis not present

## 2023-11-06 DIAGNOSIS — G629 Polyneuropathy, unspecified: Secondary | ICD-10-CM | POA: Diagnosis present

## 2023-11-06 DIAGNOSIS — N400 Enlarged prostate without lower urinary tract symptoms: Secondary | ICD-10-CM

## 2023-11-06 LAB — URINALYSIS, W/ REFLEX TO CULTURE (INFECTION SUSPECTED)
Bacteria, UA: NONE SEEN
Bilirubin Urine: NEGATIVE
Glucose, UA: 150 mg/dL — AB
Hgb urine dipstick: NEGATIVE
Ketones, ur: NEGATIVE mg/dL
Leukocytes,Ua: NEGATIVE
Nitrite: NEGATIVE
Protein, ur: NEGATIVE mg/dL
Specific Gravity, Urine: 1.019 (ref 1.005–1.030)
pH: 5 (ref 5.0–8.0)

## 2023-11-06 LAB — BASIC METABOLIC PANEL WITH GFR
Anion gap: 9 (ref 5–15)
BUN: 19 mg/dL (ref 8–23)
CO2: 32 mmol/L (ref 22–32)
Calcium: 8.9 mg/dL (ref 8.9–10.3)
Chloride: 100 mmol/L (ref 98–111)
Creatinine, Ser: 1.12 mg/dL (ref 0.61–1.24)
GFR, Estimated: >60 mL/min (ref >60)
Glucose, Bld: 186 mg/dL — ABNORMAL HIGH (ref 70–99)
Potassium: 4.3 mmol/L (ref 3.5–5.1)
Sodium: 141 mmol/L (ref 135–145)

## 2023-11-06 LAB — CBC
HCT: 43 % (ref 39.0–52.0)
Hemoglobin: 13.2 g/dL (ref 13.0–17.0)
MCH: 27.8 pg (ref 26.0–34.0)
MCHC: 30.7 g/dL (ref 30.0–36.0)
MCV: 90.7 fL (ref 80.0–100.0)
Platelets: 105 K/uL — ABNORMAL LOW (ref 150–400)
RBC: 4.74 MIL/uL (ref 4.22–5.81)
RDW: 14.6 % (ref 11.5–15.5)
WBC: 9.6 K/uL (ref 4.0–10.5)
nRBC: 0 % (ref 0.0–0.2)

## 2023-11-06 LAB — C DIFFICILE QUICK SCREEN W PCR REFLEX
C Diff antigen: NEGATIVE
C Diff interpretation: NOT DETECTED
C Diff toxin: NEGATIVE

## 2023-11-06 MED ORDER — BUDESONIDE 0.25 MG/2ML IN SUSP
0.2500 mg | Freq: Two times a day (BID) | RESPIRATORY_TRACT | Status: DC
  Administered 2023-11-06 – 2023-11-14 (×16): 0.25 mg via RESPIRATORY_TRACT
  Filled 2023-11-06 (×17): qty 2

## 2023-11-06 MED ORDER — BENZONATATE 100 MG PO CAPS: 100.0000 mg | ORAL_CAPSULE | Freq: Three times a day (TID) | ORAL | Status: DC | PRN

## 2023-11-06 NOTE — Care Management Obs Status (Signed)
 MEDICARE OBSERVATION STATUS NOTIFICATION   Patient Details  Name: Mitchell [name] MRN: [id number] Date of Birth: Feb 12, 1941   Medicare Observation Status Notification Given:  Yes    Toy LITTIE Agar, RN 11/06/2023, 1:50 PM

## 2023-11-06 NOTE — Assessment & Plan Note (Signed)
Continue proton pump inhibitor

## 2023-11-06 NOTE — Assessment & Plan Note (Signed)
 Patient complaining of ongoing diarrhea C. difficile testing negative, remainder of GI panel still pending Enteric precautions in the meantime, infectious cause of diarrhea unlikely.

## 2023-11-06 NOTE — Assessment & Plan Note (Signed)
 Secondary to COPD exacerbation Additional workup as above Weaning supplemental oxygen  to maintain oxygen  saturations of 89% or higher

## 2023-11-06 NOTE — Progress Notes (Signed)
 Mobility Specialist - Progress Note   11/06/23 1151  Therapy Vitals  Pulse Rate 90  Oxygen  Therapy  SpO2 99 %  O2 Device Nasal Cannula  O2 Flow Rate (L/min) 4 L/min  Mobility  Activity Ambulated with assistance in hallway  Level of Assistance Standby assist, set-up cues, supervision of patient - no hands on  Assistive Device Front wheel walker  Distance Ambulated (ft) 250 ft  Activity Response Tolerated well  Mobility Referral Yes  Mobility visit 1 Mobility  Mobility Specialist Stop Time (ACUTE ONLY) 1158   Pt received in recliner and agreeable to mobility. No complaints during session. Pt to recliner after session with all needs met.     [facility]

## 2023-11-06 NOTE — Progress Notes (Signed)
 PROGRESS NOTE   Mitchell [name]  FMW:[id number] DOB: 06-22-1940 DOA: 11/05/2023 PCP: Okey Carlin Redbird, MD   Date of Service: the patient was seen and examined on 11/06/2023  Brief Narrative:  83 y.o. male with medical history significant for COPD chronically on 2 L nasal cannula oxygen , paroxysmal atrial fibrillation on Eliquis , CKD stage III, legal blindness recently discharged from [facility] on 7/11 after stay for acute exacerbation of COPD now presenting to [place] emergency department with recurrent shortness of breath.    Upon EMS evaluation, patient was found to be hypoxic at 82% on their usual 2 L of oxygen  via nasal cannula.  Upon arrival to the emergency department patient was clinically felt to be suffering from acute exacerbation of COPD.  The hospitalist group was once again called to assess the patient for admission to the hospital.    Assessment & Plan COPD with acute exacerbation [facility]) Patient continuing to complain of shortness of breath, significant wheezing and productive cough  Continuing systemic steroids  Continuing aggressive bronchodilator therapy Adding Pulmicort  nebulized twice daily Continue as needed supplemental oxygen  for bouts of hypoxia  Due to persisting symptoms and unremarkable chest x-ray, will obtain contrasted CT imaging of the chest  Acute on chronic respiratory failure with hypoxia (HCC) Secondary to COPD exacerbation Additional workup as above Weaning supplemental oxygen  to maintain oxygen  saturations of 89% or higher BPH without urinary obstruction or lower urinary tract symptoms Continue Proscar  Mixed hyperlipidemia Continue pravastatin  Paroxysmal atrial fibrillation (HCC) Rate controlled Continue Toprol  Continue outpatient regimen of Eliquis  GERD without esophagitis Continue proton pump inhibitor Acute diarrhea Patient complaining of ongoing diarrhea C. difficile testing negative, remainder of GI panel still pending Enteric  precautions in the meantime, infectious cause of diarrhea unlikely.     Subjective:  Patient complains of continued shortness of breath, moderate to severe in intensity, worse with minimal exertion and associated with cough.  Physical Exam:  Vitals:   11/06/23 1151 11/06/23 1406 11/06/23 2048 11/06/23 2223  BP:  131/68 (!) 144/93 (!) 144/93  Pulse: 90 72 85 85  Resp:  20 14   Temp:  97.6 F (36.4 C) 97.9 F (36.6 C)   TempSrc:  Oral Oral   SpO2: 99% 98% 95%   Weight:      Height:        Constitutional: Awake alert and oriented x3, no associated distress.   Skin: Skin with numerous ecchymoses. Eyes: Pupils are equally reactive to light.  No evidence of scleral icterus or conjunctival pallor.  ENMT: Moist mucous membranes noted.  Posterior pharynx clear of any exudate or lesions.   Respiratory: Notable expiratory wheezing with prolonged expiratory phase in bilateral lower and mid fields.  Increased respiratory effort without accessory muscle use.  Cardiovascular: Regular rate and rhythm, no murmurs / rubs / gallops. No extremity edema. 2+ pedal pulses. No carotid bruits.  Abdomen: Abdomen is protuberant but soft and nontender.  No evidence of intra-abdominal masses.  Positive bowel sounds noted in all quadrants.   Musculoskeletal: No joint deformity upper and lower extremities. Good ROM, no contractures. Normal muscle tone.    Data Reviewed:  I have personally reviewed and interpreted labs, imaging.  Significant findings are   CBC: Recent Labs  Lab 11/05/23 1103 11/06/23 0614  WBC 8.6 9.6  NEUTROABS 6.0  --   HGB 15.1 13.2  HCT 49.4 43.0  MCV 92.2 90.7  PLT 111* 105*   Basic Metabolic Panel: Recent Labs  Lab 11/05/23 1103  11/06/23 0614  NA 140 141  K 3.6 4.3  CL 97* 100  CO2 33* 32  GLUCOSE 136* 186*  BUN 16 19  CREATININE 0.91 1.12  CALCIUM  9.0 8.9  MG 3.1*  --    GFR: Estimated Creatinine Clearance: 61 mL/min (by C-G formula based on SCr of 1.12  mg/dL). Liver Function Tests: Recent Labs  Lab 11/05/23 1103  AST 29  ALT 49*  ALKPHOS 69  BILITOT 1.0  PROT 6.3*  ALBUMIN 3.0*    Coagulation Profile: Recent Labs  Lab 11/05/23 1103  INR 1.1    Code Status:  DNR.  Code status decision has been confirmed with: patient    Severity of Illness:  The appropriate patient status for this patient is INPATIENT. Inpatient status is judged to be reasonable and necessary in order to provide the required intensity of service to ensure the patient's safety. The patient's presenting symptoms, physical exam findings, and initial radiographic and laboratory data in the context of their chronic comorbidities is felt to place them at high risk for further clinical deterioration. Furthermore, it is not anticipated that the patient will be medically stable for discharge from the hospital within 2 midnights of admission.   * I certify that at the point of admission it is my clinical judgment that the patient will require inpatient hospital care spanning beyond 2 midnights from the point of admission due to high intensity of service, high risk for further deterioration and high frequency of surveillance required.*  Time spent:  54 minutes  Author:  Zachary JINNY Ba MD  11/06/2023 10:55 PM

## 2023-11-06 NOTE — Assessment & Plan Note (Signed)
 Rate controlled Continue Toprol  Continue outpatient regimen of Eliquis 

## 2023-11-06 NOTE — Assessment & Plan Note (Signed)
-  Continue Proscar

## 2023-11-06 NOTE — Progress Notes (Signed)
   11/06/23 1447  TOC Brief Assessment  Insurance and Status Reviewed  Patient has primary care physician Yes  Home environment has been reviewed Yes home alone family stays to care for him 24  Prior level of function: Modified independent  Social Drivers of Health Review SDOH reviewed no interventions necessary  Readmission risk has been reviewed Yes  Transition of care needs no transition of care needs at this time

## 2023-11-06 NOTE — Assessment & Plan Note (Signed)
 Continue pravastatin

## 2023-11-06 NOTE — Hospital Course (Addendum)
 83yo with h/o COPD on 2L home O2, afib on Eliquis , stage 3a CKD, and legal blindness who presented on 7/22 with SOB and hypoxia to 82% despite [place] O2.  He was found to have rhinovirus and metapneumovirus and treated with Levaquin .  He has been recommended for SNF rehab but is currently declining, preferring to go home.

## 2023-11-06 NOTE — Plan of Care (Signed)

## 2023-11-06 NOTE — Assessment & Plan Note (Signed)
 Patient continuing to complain of shortness of breath, significant wheezing and productive cough  Continuing systemic steroids  Continuing aggressive bronchodilator therapy Adding Pulmicort  nebulized twice daily Continue as needed supplemental oxygen  for bouts of hypoxia  Due to persisting symptoms and unremarkable chest x-ray, will obtain contrasted CT imaging of the chest

## 2023-11-07 ENCOUNTER — Inpatient Hospital Stay (HOSPITAL_COMMUNITY)

## 2023-11-07 DIAGNOSIS — J470 Bronchiectasis with acute lower respiratory infection: Secondary | ICD-10-CM | POA: Insufficient documentation

## 2023-11-07 DIAGNOSIS — E782 Mixed hyperlipidemia: Secondary | ICD-10-CM | POA: Diagnosis not present

## 2023-11-07 DIAGNOSIS — R7303 Prediabetes: Secondary | ICD-10-CM | POA: Insufficient documentation

## 2023-11-07 DIAGNOSIS — G629 Polyneuropathy, unspecified: Secondary | ICD-10-CM

## 2023-11-07 DIAGNOSIS — B9781 Human metapneumovirus as the cause of diseases classified elsewhere: Secondary | ICD-10-CM | POA: Insufficient documentation

## 2023-11-07 DIAGNOSIS — J208 Acute bronchitis due to other specified organisms: Secondary | ICD-10-CM

## 2023-11-07 DIAGNOSIS — J206 Acute bronchitis due to rhinovirus: Secondary | ICD-10-CM | POA: Diagnosis not present

## 2023-11-07 DIAGNOSIS — J441 Chronic obstructive pulmonary disease with (acute) exacerbation: Secondary | ICD-10-CM | POA: Diagnosis not present

## 2023-11-07 LAB — COMPREHENSIVE METABOLIC PANEL WITH GFR
ALT: 34 U/L (ref 0–44)
AST: 18 U/L (ref 15–41)
Albumin: 2.6 g/dL — ABNORMAL LOW (ref 3.5–5.0)
Alkaline Phosphatase: 56 U/L (ref 38–126)
Anion gap: 10 (ref 5–15)
BUN: 24 mg/dL — ABNORMAL HIGH (ref 8–23)
CO2: 27 mmol/L (ref 22–32)
Calcium: 8.7 mg/dL — ABNORMAL LOW (ref 8.9–10.3)
Chloride: 98 mmol/L (ref 98–111)
Creatinine, Ser: 1.12 mg/dL (ref 0.61–1.24)
GFR, Estimated: >60 mL/min (ref >60)
Glucose, Bld: 183 mg/dL — ABNORMAL HIGH (ref 70–99)
Potassium: 4.1 mmol/L (ref 3.5–5.1)
Sodium: 135 mmol/L (ref 135–145)
Total Bilirubin: 0.6 mg/dL (ref 0.0–1.2)
Total Protein: 5.4 g/dL — ABNORMAL LOW (ref 6.5–8.1)

## 2023-11-07 LAB — RESPIRATORY PANEL BY PCR

## 2023-11-07 LAB — GASTROINTESTINAL PANEL BY PCR, STOOL (REPLACES STOOL CULTURE)

## 2023-11-07 LAB — CBC WITH DIFFERENTIAL/PLATELET
Abs Immature Granulocytes: 0.1 K/uL — ABNORMAL HIGH (ref 0.00–0.07)
Basophils Absolute: 0 K/uL (ref 0.0–0.1)
Basophils Relative: 0 %
Eosinophils Absolute: 0 K/uL (ref 0.0–0.5)
Eosinophils Relative: 0 %
HCT: 43.9 % (ref 39.0–52.0)
Hemoglobin: 13.8 g/dL (ref 13.0–17.0)
Immature Granulocytes: 1 %
Lymphocytes Relative: 4 %
Lymphs Abs: 0.4 K/uL — ABNORMAL LOW (ref 0.7–4.0)
MCH: 28.2 pg (ref 26.0–34.0)
MCHC: 31.4 g/dL (ref 30.0–36.0)
MCV: 89.8 fL (ref 80.0–100.0)
Monocytes Absolute: 0.2 K/uL (ref 0.1–1.0)
Monocytes Relative: 2 %
Neutro Abs: 9.1 K/uL — ABNORMAL HIGH (ref 1.7–7.7)
Neutrophils Relative %: 93 %
Platelets: 122 K/uL — ABNORMAL LOW (ref 150–400)
RBC: 4.89 MIL/uL (ref 4.22–5.81)
RDW: 14.5 % (ref 11.5–15.5)
WBC: 9.8 K/uL (ref 4.0–10.5)
nRBC: 0 % (ref 0.0–0.2)

## 2023-11-07 LAB — MAGNESIUM: Magnesium: 2.3 mg/dL (ref 1.7–2.4)

## 2023-11-07 LAB — PROCALCITONIN: Procalcitonin: <0.1 ng/mL

## 2023-11-07 MED ORDER — INSULIN ASPART 100 UNIT/ML IJ SOLN
0.0000 [IU] | Freq: Three times a day (TID) | INTRAMUSCULAR | Status: DC
  Administered 2023-11-08 – 2023-11-10 (×7): 2 [IU] via SUBCUTANEOUS
  Administered 2023-11-10: 3 [IU] via SUBCUTANEOUS
  Administered 2023-11-10: 2 [IU] via SUBCUTANEOUS
  Administered 2023-11-11: 3 [IU] via SUBCUTANEOUS
  Administered 2023-11-11 (×2): 2 [IU] via SUBCUTANEOUS
  Administered 2023-11-12: 5 [IU] via SUBCUTANEOUS
  Administered 2023-11-12 (×2): 2 [IU] via SUBCUTANEOUS
  Administered 2023-11-13 (×2): 1 [IU] via SUBCUTANEOUS
  Administered 2023-11-13: 3 [IU] via SUBCUTANEOUS

## 2023-11-07 MED ORDER — SODIUM CHLORIDE 3 % IN NEBU
4.0000 mL | INHALATION_SOLUTION | Freq: Two times a day (BID) | RESPIRATORY_TRACT | Status: AC
  Administered 2023-11-07 – 2023-11-10 (×6): 4 mL via RESPIRATORY_TRACT
  Filled 2023-11-07 (×7): qty 4

## 2023-11-07 MED ORDER — MELATONIN 5 MG PO TABS
5.0000 mg | ORAL_TABLET | Freq: Every evening | ORAL | Status: AC | PRN
  Administered 2023-11-07 – 2023-11-11 (×4): 5 mg via ORAL
  Filled 2023-11-07 (×4): qty 1

## 2023-11-07 MED ORDER — LEVOFLOXACIN 500 MG PO TABS
500.0000 mg | ORAL_TABLET | Freq: Every day | ORAL | Status: AC
  Administered 2023-11-08 – 2023-11-11 (×5): 500 mg via ORAL
  Filled 2023-11-07 (×5): qty 1

## 2023-11-07 MED ORDER — LEVOFLOXACIN 500 MG PO TABS: 500.0000 mg | ORAL_TABLET | Freq: Every day | ORAL | Status: DC

## 2023-11-07 MED ORDER — SODIUM CHLORIDE 3 % IN NEBU: 4.0000 mL | INHALATION_SOLUTION | Freq: Two times a day (BID) | RESPIRATORY_TRACT | Status: DC

## 2023-11-07 MED ORDER — IOHEXOL 300 MG/ML  SOLN
75.0000 mL | Freq: Once | INTRAMUSCULAR | Status: AC | PRN
  Administered 2023-11-07: 75 mL via INTRAVENOUS

## 2023-11-07 MED ORDER — PREGABALIN 50 MG PO CAPS
50.0000 mg | ORAL_CAPSULE | Freq: Two times a day (BID) | ORAL | Status: DC
  Administered 2023-11-07 – 2023-11-14 (×14): 50 mg via ORAL
  Filled 2023-11-07 (×14): qty 1

## 2023-11-07 NOTE — Assessment & Plan Note (Signed)
 Complex presentation in patient with advanced COPD and chronic respiratory failure Patient suffering from acute bronchitis secondary to metapneumovirus and rhinovirus seen on respiratory viral panel CT imaging revealing new bibasilar infiltrates which I believe is more likely due to superimposed bronchiectasis Initiating levofloxacin  500 mg daily for total of 5 days to cover for all usual organisms associated with acute bacterial bronchitis and COPD and organisms associated with acute bronchiectasis (pseudomonas) Continuing systemic steroids  Continuing aggressive bronchodilator therapy Continuing Pulmicort  nebulized twice daily Adding hypertonic saline to help mobilize sputum production while sputum culture ordered. Supplemental oxygen  weaned back to baseline of 2 L/min via [place]. Flutter valve therapy

## 2023-11-07 NOTE — Progress Notes (Signed)
 PT Cancellation Note  Patient Details Name: Mitchell [name] MRN: [id number] DOB: May 20, 1940   Cancelled Treatment:    Reason Eval/Treat Not Completed: Fatigue/lethargy limiting ability to participate Pt reports fatigue and wishing to rest at this time.  Pt has been mobilizing with staff yesterday and today.  Will check back as schedule permits.    Tari CROME Payson 11/07/2023, 3:20 PM Tari KLEIN, DPT Physical Therapist Acute Rehabilitation Services Office: [phone]

## 2023-11-07 NOTE — Assessment & Plan Note (Signed)
Continue proton pump inhibitor

## 2023-11-07 NOTE — Assessment & Plan Note (Signed)
 Rate controlled Continue Toprol  Continue outpatient regimen of Eliquis 

## 2023-11-07 NOTE — Progress Notes (Addendum)
 PROGRESS NOTE   Mitchell [name]  FMW:[id number] DOB: 1940-04-29 DOA: 11/05/2023 PCP: Okey Carlin Redbird, MD   Date of Service: the patient was seen and examined on 11/07/2023  Brief Narrative:  83 y.o. male with medical history significant for COPD chronically on 2 L nasal cannula oxygen , paroxysmal atrial fibrillation on Eliquis , CKD stage III, legal blindness recently discharged from [facility] on 7/11 after stay for acute exacerbation of COPD now presenting to [facility] emergency department with recurrent shortness of breath.    Upon EMS evaluation, patient was found to be hypoxic at 82% on their usual 2 L of oxygen  via nasal cannula.  Upon arrival to the emergency department patient was clinically felt to be suffering from acute exacerbation of COPD.  The hospitalist group was once again called to assess the patient for admission to the hospital.    Patient was placed on a combination of aggressive bronchodilator therapy and systemic steroids.  Respiratory viral panel revealed concurrent infection with rhinovirus and metapneumovirus.  Contrast CT imaging of the chest revealed new bilateral lower lobe infiltrates with bronchiectasis.  Assessment & Plan COPD with acute exacerbation (HCC) Acute on chronic respiratory failure with hypoxia (HCC) Acute bronchitis with bronchiectasis (HCC) Acute bronchitis due to Rhinovirus Acute bronchitis due to human metapneumovirus (hMPV) Complex presentation in patient with advanced COPD and chronic respiratory failure Patient suffering from acute bronchitis secondary to metapneumovirus and rhinovirus seen on respiratory viral panel CT imaging revealing new bibasilar infiltrates which I believe is more likely due to superimposed bronchiectasis Initiating levofloxacin  500 mg daily for total of 5 days to cover for all usual organisms associated with acute bacterial bronchitis and COPD and organisms associated with acute bronchiectasis  (pseudomonas) Continuing systemic steroids  Continuing aggressive bronchodilator therapy Continuing Pulmicort  nebulized twice daily Adding hypertonic saline to help mobilize sputum production while sputum culture ordered. Supplemental oxygen  weaned back to baseline of 2 L/min via . Flutter valve therapy  Peripheral polyneuropathy According to the son patient has been suffering from polyneuropathy and has been placed on gabapentin  several months ago for management of this Since then, son reports that his father has been extremely lethargic and does not want his home regimen of gabapentin  resumed I have transitioned the patient to low-dose Lyrica  twice daily instead. BPH without urinary obstruction or lower urinary tract symptoms Continue Proscar  Mixed hyperlipidemia Continue pravastatin  Paroxysmal atrial fibrillation (HCC) Rate controlled Continue Toprol  Continue outpatient regimen of Eliquis  GERD without esophagitis Continue proton pump inhibitor Acute diarrhea Patient complaining of longstanding loose stools Abdomen nontender C. difficile testing negative, GI pathogen panel negative Prediabetes Known history of prediabetes  Will place patient on Accu-Cheks before every meal and nightly with sliding scale insulin  while on systemic steroids Will obtain repeat hemoglobin A1c considering persisting hyperglycemia on chemistries.     Subjective:  Patient complains of continued shortness of breath, moderate to severe in intensity, worse with minimal exertion and associated with cough.  Patient reports he has been unable to produce significant sputum with his cough.  Physical Exam:  Vitals:   11/07/23 1321 11/07/23 1935 11/07/23 2138 11/07/23 2209  BP: (!) 140/73  (!) 150/92 (!) 150/92  Pulse: 83  100 100  Resp: 16  20   Temp: 97.6 F (36.4 C)  98 F (36.7 C)   TempSrc: Oral     SpO2: 98% 97% 94%   Weight:      Height:        Constitutional: Awake alert and oriented  x3, no associated distress.   Skin: Skin with numerous ecchymoses. Eyes: Pupils are equally reactive to light.  No evidence of scleral icterus or conjunctival pallor.  ENMT: Moist mucous membranes noted.  Posterior pharynx clear of any exudate or lesions.   Respiratory: Notable expiratory wheezing with prolonged expiratory phase in bilateral lower and mid fields.  Notable significant diffuse rhonchi as well.  Increased respiratory effort without accessory muscle use.  Cardiovascular: Regular rate and rhythm, no murmurs / rubs / gallops. No extremity edema. 2+ pedal pulses. No carotid bruits.  Abdomen: Abdomen is protuberant but soft and nontender.  No evidence of intra-abdominal masses.  Positive bowel sounds noted in all quadrants.   Musculoskeletal: No joint deformity upper and lower extremities. Good ROM, no contractures. Normal muscle tone.    Data Reviewed:  I have personally reviewed and interpreted labs, imaging.  Significant findings are   CBC: Recent Labs  Lab 11/05/23 1103 11/06/23 0614 11/07/23 0624  WBC 8.6 9.6 9.8  NEUTROABS 6.0  --  9.1*  HGB 15.1 13.2 13.8  HCT 49.4 43.0 43.9  MCV 92.2 90.7 89.8  PLT 111* 105* 122*   Basic Metabolic Panel: Recent Labs  Lab 11/05/23 1103 11/06/23 0614 11/07/23 0624  NA 140 141 135  K 3.6 4.3 4.1  CL 97* 100 98  CO2 33* 32 27  GLUCOSE 136* 186* 183*  BUN 16 19 24*  CREATININE 0.91 1.12 1.12  CALCIUM  9.0 8.9 8.7*  MG 3.1*  --  2.3   GFR: Estimated Creatinine Clearance: 61 mL/min (by C-G formula based on SCr of 1.12 mg/dL). Liver Function Tests: Recent Labs  Lab 11/05/23 1103 11/07/23 0624  AST 29 18  ALT 49* 34  ALKPHOS 69 56  BILITOT 1.0 0.6  PROT 6.3* 5.4*  ALBUMIN 3.0* 2.6*    Coagulation Profile: Recent Labs  Lab 11/05/23 1103  INR 1.1    Code Status:  DNR.  Code status decision has been confirmed with: patient  Family Communication: Plan of care discussed with son face-to-face.   Severity of  Illness:  The appropriate patient status for this patient is INPATIENT. Inpatient status is judged to be reasonable and necessary in order to provide the required intensity of service to ensure the patient's safety. The patient's presenting symptoms, physical exam findings, and initial radiographic and laboratory data in the context of their chronic comorbidities is felt to place them at high risk for further clinical deterioration. Furthermore, it is not anticipated that the patient will be medically stable for discharge from the hospital within 2 midnights of admission.   * I certify that at the point of admission it is my clinical judgment that the patient will require inpatient hospital care spanning beyond 2 midnights from the point of admission due to high intensity of service, high risk for further deterioration and high frequency of surveillance required.*  Time spent:  59 minutes  Author:  Zachary JINNY Ba MD  11/07/2023 11:00 PM

## 2023-11-07 NOTE — Assessment & Plan Note (Signed)
 Continue pravastatin

## 2023-11-07 NOTE — Progress Notes (Signed)
 PT Cancellation Note  Patient Details Name: Mitchell [name] MRN: [id number] DOB: 1941-03-18   Cancelled Treatment:    Reason Eval/Treat Not Completed: Fatigue/lethargy limiting ability to participate Pt reports he was just OOB and declined PT at this time.  Will check back as schedule permits.   Tari CROME Payson 11/07/2023, 11:48 AM Tari KLEIN, DPT Physical Therapist Acute Rehabilitation Services Office: [phone]

## 2023-11-07 NOTE — Progress Notes (Signed)
 PT os out of room for procedure per NT. RT unable to deliver  Scheduled treatment at this time. Will attempt later this shift.

## 2023-11-07 NOTE — Assessment & Plan Note (Signed)
 According to the son patient has been suffering from polyneuropathy and has been placed on gabapentin  several months ago for management of this Since then, son reports that his father has been extremely lethargic and does not want his home regimen of gabapentin  resumed I have transitioned the patient to low-dose Lyrica  twice daily instead.

## 2023-11-07 NOTE — Assessment & Plan Note (Signed)
-  Continue Proscar

## 2023-11-07 NOTE — Assessment & Plan Note (Signed)
 Patient complaining of longstanding loose stools Abdomen nontender C. difficile testing negative, GI pathogen panel negative

## 2023-11-07 NOTE — Assessment & Plan Note (Signed)
 Known history of prediabetes  Will place patient on Accu-Cheks before every meal and nightly with sliding scale insulin  while on systemic steroids Will obtain repeat hemoglobin A1c considering persisting hyperglycemia on chemistries.

## 2023-11-08 ENCOUNTER — Other Ambulatory Visit: Payer: Self-pay

## 2023-11-08 ENCOUNTER — Other Ambulatory Visit (HOSPITAL_COMMUNITY): Payer: Self-pay

## 2023-11-08 DIAGNOSIS — R9389 Abnormal findings on diagnostic imaging of other specified body structures: Secondary | ICD-10-CM | POA: Insufficient documentation

## 2023-11-08 DIAGNOSIS — E782 Mixed hyperlipidemia: Secondary | ICD-10-CM | POA: Diagnosis not present

## 2023-11-08 DIAGNOSIS — J441 Chronic obstructive pulmonary disease with (acute) exacerbation: Secondary | ICD-10-CM | POA: Diagnosis not present

## 2023-11-08 DIAGNOSIS — J208 Acute bronchitis due to other specified organisms: Secondary | ICD-10-CM | POA: Diagnosis not present

## 2023-11-08 DIAGNOSIS — J206 Acute bronchitis due to rhinovirus: Secondary | ICD-10-CM | POA: Diagnosis not present

## 2023-11-08 LAB — COMPREHENSIVE METABOLIC PANEL WITH GFR
ALT: 30 U/L (ref 0–44)
AST: 15 U/L (ref 15–41)
Albumin: 2.4 g/dL — ABNORMAL LOW (ref 3.5–5.0)
Alkaline Phosphatase: 52 U/L (ref 38–126)
Anion gap: 6 (ref 5–15)
BUN: 25 mg/dL — ABNORMAL HIGH (ref 8–23)
CO2: 29 mmol/L (ref 22–32)
Calcium: 8.5 mg/dL — ABNORMAL LOW (ref 8.9–10.3)
Chloride: 99 mmol/L (ref 98–111)
Creatinine, Ser: 1.01 mg/dL (ref 0.61–1.24)
GFR, Estimated: >60 mL/min (ref >60)
Glucose, Bld: 184 mg/dL — ABNORMAL HIGH (ref 70–99)
Potassium: 4.6 mmol/L (ref 3.5–5.1)
Sodium: 134 mmol/L — ABNORMAL LOW (ref 135–145)
Total Bilirubin: 0.6 mg/dL (ref 0.0–1.2)
Total Protein: 5.2 g/dL — ABNORMAL LOW (ref 6.5–8.1)

## 2023-11-08 LAB — CBC WITH DIFFERENTIAL/PLATELET
Abs Immature Granulocytes: 0.15 K/uL — ABNORMAL HIGH (ref 0.00–0.07)
Basophils Absolute: 0 K/uL (ref 0.0–0.1)
Basophils Relative: 0 %
Eosinophils Absolute: 0 K/uL (ref 0.0–0.5)
Eosinophils Relative: 0 %
HCT: 42 % (ref 39.0–52.0)
Hemoglobin: 13.2 g/dL (ref 13.0–17.0)
Immature Granulocytes: 1 %
Lymphocytes Relative: 4 %
Lymphs Abs: 0.4 K/uL — ABNORMAL LOW (ref 0.7–4.0)
MCH: 28 pg (ref 26.0–34.0)
MCHC: 31.4 g/dL (ref 30.0–36.0)
MCV: 89.2 fL (ref 80.0–100.0)
Monocytes Absolute: 0.5 K/uL (ref 0.1–1.0)
Monocytes Relative: 4 %
Neutro Abs: 9.5 K/uL — ABNORMAL HIGH (ref 1.7–7.7)
Neutrophils Relative %: 91 %
Platelets: 128 K/uL — ABNORMAL LOW (ref 150–400)
RBC: 4.71 MIL/uL (ref 4.22–5.81)
RDW: 14.6 % (ref 11.5–15.5)
WBC: 10.5 K/uL (ref 4.0–10.5)
nRBC: 0 % (ref 0.0–0.2)

## 2023-11-08 LAB — GLUCOSE, CAPILLARY
Glucose-Capillary: 169 mg/dL — ABNORMAL HIGH (ref 70–99)
Glucose-Capillary: 181 mg/dL — ABNORMAL HIGH (ref 70–99)
Glucose-Capillary: 176 mg/dL — ABNORMAL HIGH (ref 70–99)
Glucose-Capillary: 207 mg/dL — ABNORMAL HIGH (ref 70–99)

## 2023-11-08 LAB — HEMOGLOBIN A1C
Hgb A1c MFr Bld: 6.3 % — ABNORMAL HIGH (ref 4.8–5.6)
Mean Plasma Glucose: 134.11 mg/dL

## 2023-11-08 LAB — MAGNESIUM: Magnesium: 2.3 mg/dL (ref 1.7–2.4)

## 2023-11-08 LAB — PHOSPHORUS: Phosphorus: 3.2 mg/dL (ref 2.5–4.6)

## 2023-11-08 NOTE — Assessment & Plan Note (Signed)
 CT scan performed 7/24 revealed possible partial obstruction/scarring of right brachiocephalic vein. This is unlikely to be clinically significant as patient is already on Eliquis  therapy for atrial fibrillation No concerning associated physical exam findings on exam of the right neck or right upper extremity.

## 2023-11-08 NOTE — Assessment & Plan Note (Signed)
 Patient complaining of longstanding loose stools Abdomen nontender C. difficile testing negative, GI pathogen panel negative

## 2023-11-08 NOTE — Assessment & Plan Note (Addendum)
 Complex presentation in patient with advanced COPD and chronic respiratory failure Patient suffering from acute bronchitis secondary to metapneumovirus and rhinovirus seen on respiratory viral panel Patient also found to have bibasilar infiltrates on CT with likely superimposed bronchiectasis  Continue day 2 of levofloxacin  500 mg daily for total of 5 days to cover for all usual organisms associated with acute bacterial bronchitis and COPD and organisms associated with acute bronchiectasis (pseudomonas) Obtaining SLP evaluation to ensure patient is not suffering from bouts of aspiration. Continuing systemic steroids  Continuing aggressive bronchodilator therapy Continuing Pulmicort  nebulized twice daily Adding hypertonic saline to help mobilize sputum production while sputum culture ordered. Acute on chronic hypoxic respiratory failure resolved -- supplemental oxygen  weaned back to baseline of 2 L/min via [place]. Hypertonic saline initiated 7/24  Continuing flutter valve therapy

## 2023-11-08 NOTE — Assessment & Plan Note (Signed)
 Known history of prediabetes  Will place patient on Accu-Cheks before every meal and nightly with sliding scale insulin  while on systemic steroids Will obtain repeat hemoglobin A1c considering persisting hyperglycemia on chemistries.

## 2023-11-08 NOTE — Assessment & Plan Note (Signed)
 Rate controlled Continue Toprol  Continue outpatient regimen of Eliquis 

## 2023-11-08 NOTE — Assessment & Plan Note (Signed)
 Continue pravastatin

## 2023-11-08 NOTE — Progress Notes (Addendum)
 PROGRESS NOTE   Mitchell [name]  FMW:[id number] DOB: [birth year] DOA: 11/05/2023 PCP: Okey Carlin Redbird, MD   Date of Service: the patient was seen and examined on 11/08/2023  Brief Narrative:  83 y.o. male with medical history significant for COPD chronically on 2 L nasal cannula oxygen , paroxysmal atrial fibrillation on Eliquis , CKD stage III, legal blindness recently discharged from [facility] on 7/11 after stay for acute exacerbation of COPD now presenting to [facility] emergency department with recurrent shortness of breath.    Upon EMS evaluation, patient was found to be hypoxic at 82% on their usual 2 L of oxygen  via nasal cannula.  Upon arrival to the emergency department patient was clinically felt to be suffering from acute exacerbation of COPD.  The hospitalist group was once again called to assess the patient for admission to the hospital.    Patient was placed on a combination of aggressive bronchodilator therapy and systemic steroids.  Respiratory viral panel revealed concurrent infection with rhinovirus and metapneumovirus.  Contrast CT imaging of the chest revealed new bilateral lower lobe infiltrates with bronchiectasis.  Patient was initiated on levofloxacin .  Patient was placed on hypertonic saline and Acapella valve therapy to mobilize secretions.  Patient was evaluated by physical therapy and it was felt that patient would benefit from skilled therapy services in a skilled nursing facility.  Assessment & Plan COPD with acute exacerbation (HCC) Acute on chronic respiratory failure with hypoxia (HCC) Acute bronchitis with bronchiectasis (HCC) Acute bronchitis due to Rhinovirus Acute bronchitis due to human metapneumovirus (hMPV) Complex presentation in patient with advanced COPD and chronic respiratory failure Patient suffering from acute bronchitis secondary to metapneumovirus and rhinovirus seen on respiratory viral panel Patient also found to have bibasilar  infiltrates on CT with likely superimposed bronchiectasis  Continue day 2 of levofloxacin  500 mg daily for total of 5 days to cover for all usual organisms associated with acute bacterial bronchitis and COPD and organisms associated with acute bronchiectasis (pseudomonas) Obtaining SLP evaluation to ensure patient is not suffering from bouts of aspiration. Continuing systemic steroids  Continuing aggressive bronchodilator therapy Continuing Pulmicort  nebulized twice daily Adding hypertonic saline to help mobilize sputum production while sputum culture ordered. Acute on chronic hypoxic respiratory failure resolved -- supplemental oxygen  weaned back to baseline of 2 L/min via [place]. Hypertonic saline initiated 7/24  Continuing flutter valve therapy  Peripheral polyneuropathy According to the son patient has been suffering from polyneuropathy and has been placed on gabapentin  several months ago for management of this Since then, son reports that his father has been extremely lethargic and does not want his home regimen of gabapentin  resumed I transitioned the patient to low-dose Lyrica  twice daily instead on 7/24. BPH without urinary obstruction or lower urinary tract symptoms Continue Proscar  Mixed hyperlipidemia Continue pravastatin  Paroxysmal atrial fibrillation (HCC) Rate controlled Continue Toprol  Continue outpatient regimen of Eliquis  GERD without esophagitis Continue proton pump inhibitor Acute diarrhea Patient complaining of longstanding loose stools Abdomen nontender C. difficile testing negative, GI pathogen panel negative Prediabetes Known history of prediabetes  Will place patient on Accu-Cheks before every meal and nightly with sliding scale insulin  while on systemic steroids Will obtain repeat hemoglobin A1c considering persisting hyperglycemia on chemistries. Abnormal CT scan, chest CT scan performed 7/24 revealed possible partial obstruction/scarring of right  brachiocephalic vein. This is unlikely to be clinically significant as patient is already on Eliquis  therapy for atrial fibrillation No concerning associated physical exam findings on exam of the right neck or  right upper extremity. Class 1 obesity due to excess calories with serious comorbidity and body mass index (BMI) of 30.0 to 30.9 in adult Obesity certainly plays a role in patients overall difficulty with managing his respiratory illness although I don't believe it is severe enough to the point of obesity hypoventilation syndrome Will counsel the patient on caloric restriction and regular physical activity as the patient recovers.    Subjective:  Patient continues to complain of shortness of breath, moderate intensity, worse with exertion and improved with rest.  Patient complaining of associated cough that sounds wet but is overall nonproductive.  Overall patient is feels somewhat better compared to yesterday.  Patient states that he was able to walk with physical therapy today.  Physical Exam:  Vitals:   11/08/23 1252 11/08/23 1314 11/08/23 1609 11/08/23 1941  BP:  (!) 140/88    Pulse:  68    Resp:  16    Temp:  97.9 F (36.6 C)    TempSrc:  Oral    SpO2: 96% 95% 96% 97%  Weight:      Height:        Constitutional: Awake alert and oriented x3, no associated distress.   Skin: Thin skin with notable ecchymoses, Eyes: Pupils are equally reactive to light.  No evidence of scleral icterus or conjunctival pallor.  ENMT: Somewhat dry mucous membranes noted.  Posterior pharynx clear of any exudate or lesions.   Respiratory: Improving expiratory wheezing with prolonged expiratory phase in bilateral lower and mid fields.  Proving rhonchi.  No evidence of increased respiratory effort oraccessory muscle use.  Cardiovascular: Regular rate and rhythm, no murmurs / rubs / gallops. No extremity edema. 2+ pedal pulses. No carotid bruits.  Abdomen: Abdomen is protuberant but soft and  nontender.  No evidence of intra-abdominal masses.  Positive bowel sounds noted in all quadrants.   Musculoskeletal: No joint deformity upper and lower extremities. Good ROM, no contractures. Normal muscle tone.    Data Reviewed:  I have personally reviewed and interpreted labs, imaging.  Significant findings are   CBC: Recent Labs  Lab 11/05/23 1103 11/06/23 0614 11/07/23 0624 11/08/23 0518  WBC 8.6 9.6 9.8 10.5  NEUTROABS 6.0  --  9.1* 9.5*  HGB 15.1 13.2 13.8 13.2  HCT 49.4 43.0 43.9 42.0  MCV 92.2 90.7 89.8 89.2  PLT 111* 105* 122* 128*   Basic Metabolic Panel: Recent Labs  Lab 11/05/23 1103 11/06/23 0614 11/07/23 0624 11/08/23 0518  NA 140 141 135 134*  K 3.6 4.3 4.1 4.6  CL 97* 100 98 99  CO2 33* 32 27 29  GLUCOSE 136* 186* 183* 184*  BUN 16 19 24* 25*  CREATININE 0.91 1.12 1.12 1.01  CALCIUM  9.0 8.9 8.7* 8.5*  MG 3.1*  --  2.3 2.3  PHOS  --   --   --  3.2   GFR: Estimated Creatinine Clearance: 67.6 mL/min (by C-G formula based on SCr of 1.01 mg/dL). Liver Function Tests: Recent Labs  Lab 11/05/23 1103 11/07/23 0624 11/08/23 0518  AST 29 18 15   ALT 49* 34 30  ALKPHOS 69 56 52  BILITOT 1.0 0.6 0.6  PROT 6.3* 5.4* 5.2*  ALBUMIN 3.0* 2.6* 2.4*    Coagulation Profile: Recent Labs  Lab 11/05/23 1103  INR 1.1    Code Status:  DNR.  Code status decision has been confirmed with: patient  Family Communication: Plan of care discussed with son face-to-face.   Severity of Illness:  The appropriate  patient status for this patient is INPATIENT. Inpatient status is judged to be reasonable and necessary in order to provide the required intensity of service to ensure the patient's safety. The patient's presenting symptoms, physical exam findings, and initial radiographic and laboratory data in the context of their chronic comorbidities is felt to place them at high risk for further clinical deterioration. Furthermore, it is not anticipated that the patient  will be medically stable for discharge from the hospital within 2 midnights of admission.   * I certify that at the point of admission it is my clinical judgment that the patient will require inpatient hospital care spanning beyond 2 midnights from the point of admission due to high intensity of service, high risk for further deterioration and high frequency of surveillance required.*  Time spent:  54 minutes  Author:  Zachary JINNY Ba MD  11/08/2023 7:59 PM

## 2023-11-08 NOTE — Assessment & Plan Note (Signed)
Continue proton pump inhibitor

## 2023-11-08 NOTE — Assessment & Plan Note (Signed)
-  Continue Proscar

## 2023-11-08 NOTE — Evaluation (Signed)
 Physical Therapy Evaluation Patient Details Name: Mitchell [name] MRN: [id number] DOB: 1941/01/05 Today's Date: 11/08/2023  History of Present Illness  83 y.o. male admitted on 11/05/23 with advanced COPD and chronic respiratory failure with patient suffering from acute bronchitis secondary to metapneumovirus and rhinovirus seen on respiratory viral panel.   Past medical history significant for COPD chronically on 2-3 L nasal cannula oxygen , paroxysmal atrial fibrillation on Eliquis , CKD stage III, legal blindness recently discharged from [facility] on 7/11 after stay for acute exacerbation of COPD and acute on chronic diastolic CHF  Clinical Impression  Pt admitted with above diagnosis.  Pt currently with functional limitations due to the deficits listed below (see PT Problem List). Pt will benefit from acute skilled PT to increase their independence and safety with mobility to allow discharge.  Pt requesting and also requiring some physical assist for bed mobility and transferring to recliner today.  Pt requires encouragement to mobilize and eventually agreeable for OOB to recliner (for lunch).  Pt appears more disgruntled with staff constantly in /out of his room.  Patient will benefit from continued inpatient follow up therapy, <3 hours/day if pt agreeable.  If home, will likely need increased care at this time (since pt describes living alone and being modified independent with walker for household distances and ADLs; ex-spouse assists with IADLs).          If plan is discharge home, recommend the following: A little help with walking and/or transfers;A little help with bathing/dressing/bathroom;Assistance with cooking/housework;Help with stairs or ramp for entrance   Can travel by private vehicle        Equipment Recommendations None recommended by PT  Recommendations for Other Services       Functional Status Assessment Patient has had a recent decline in their functional status and  demonstrates the ability to make significant improvements in function in a reasonable and predictable amount of time.     Precautions / Restrictions Precautions Precautions: Fall Recall of Precautions/Restrictions: Intact Precaution/Restrictions Comments: currently on 3L O2 [place] at rest (baseline Home oxygen  2 lts at rest and 3 lts with activity) Restrictions Other Position/Activity Restrictions: legally blind/HOH      Mobility  Bed Mobility Overal bed mobility: Needs Assistance Bed Mobility: Supine to Sit     Supine to sit: Mod assist     General bed mobility comments: assist for trunk upright and assist for pt to lift to scoot out to EOB, pt requested assist donning shoes once EOB    Transfers Overall transfer level: Needs assistance Equipment used: Rolling walker (2 wheels) Transfers: Sit to/from Stand Sit to Stand: Mod assist   Step pivot transfers: Contact guard assist       General transfer comment: remained on 3L O2 [place], assist to rise and stabilize, only agreeable for OOB to recliner (multimodal cues provided due to [facility] and visual impairments)    Ambulation/Gait                  Stairs            Wheelchair Mobility     Tilt Bed    Modified Rankin (Stroke Patients Only)       Balance Overall balance assessment: Mild deficits observed, not formally tested Sitting-balance support: No upper extremity supported, Feet supported Sitting balance-Leahy Scale: Good     Standing balance support: Bilateral upper extremity supported, Reliant on assistive device for balance Standing balance-Leahy Scale: Poor  Pertinent Vitals/Pain      Home Living Family/patient expects to be discharged to:: Private residence Living Arrangements: Alone Available Help at Discharge: Family;Available PRN/intermittently (ex-wife assist with transportation/groceries as needed) Type of Home: Apartment Home Access: Level  entry       Home Layout: One level Home Equipment: Rollator (4 wheels);Wheelchair - power;BSC/3in1;Cane - single point Additional Comments: increased difficulty the past months with all aspects of IADL's and some ADL's    Prior Function Prior Level of Function : Needs assist             Mobility Comments: Pt using bil canes for ambulation; could ambulate short community distances prior to recent admission; reports using walker currently ADLs Comments: He was modified independent to independent with ADLs but does have difficulty, he does not drive, and his ex-spouse stopped by regularly to assist him with cleaning, laundry, and taking him to appointments.     Extremity/Trunk Assessment        Lower Extremity Assessment Lower Extremity Assessment: Generalized weakness;LLE deficits/detail;RLE deficits/detail RLE Sensation: history of peripheral neuropathy LLE Sensation: history of peripheral neuropathy       Communication   Communication Communication: Impaired Factors Affecting Communication: Hearing impaired    Cognition Arousal: Alert Behavior During Therapy: WFL for tasks assessed/performed   PT - Cognitive impairments: No apparent impairments                       PT - Cognition Comments: HOH and legally blind Following commands: Intact       Cueing Cueing Techniques: Verbal cues     General Comments      Exercises     Assessment/Plan    PT Assessment Patient needs continued PT services  PT Problem List Decreased strength;Cardiopulmonary status limiting activity;Decreased activity tolerance;Decreased knowledge of use of DME;Decreased balance;Decreased mobility       PT Treatment Interventions DME instruction;Therapeutic exercise;Gait training;Stair training;Functional mobility training;Therapeutic activities;Patient/family education;Balance training    PT Goals (Current goals can be found in the Care Plan section)  Acute Rehab PT Goals PT  Goal Formulation: With patient Time For Goal Achievement: 11/23/23 Potential to Achieve Goals: Fair    Frequency Min 2X/week     Co-evaluation               AM-PAC PT 6 Clicks Mobility  Outcome Measure Help needed turning from your back to your side while in a flat bed without using bedrails?: A Little Help needed moving from lying on your back to sitting on the side of a flat bed without using bedrails?: A Little Help needed moving to and from a bed to a chair (including a wheelchair)?: A Lot Help needed standing up from a chair using your arms (e.g., wheelchair or bedside chair)?: A Lot Help needed to walk in hospital room?: A Lot Help needed climbing 3-5 steps with a railing? : Total 6 Click Score: 13    End of Session Equipment Utilized During Treatment: Gait belt;Oxygen  Activity Tolerance: Patient tolerated treatment well Patient left: in chair;with call bell/phone within reach;with chair alarm set Nurse Communication: Mobility status PT Visit Diagnosis: Other abnormalities of gait and mobility (R26.89);Muscle weakness (generalized) (M62.81)    Time: 8857-8844 PT Time Calculation (min) (ACUTE ONLY): 13 min   Charges:   PT Evaluation $PT Eval Low Complexity: 1 Low   PT General Charges $$ ACUTE PT VISIT: 1 Visit        Tari PT, DPT Physical Therapist Acute  Rehabilitation Services Office: [phone]   Tari LITTIE Farm 11/08/2023, 1:56 PM

## 2023-11-08 NOTE — Assessment & Plan Note (Addendum)
 According to the son patient has been suffering from polyneuropathy and has been placed on gabapentin  several months ago for management of this Since then, son reports that his father has been extremely lethargic and does not want his home regimen of gabapentin  resumed I transitioned the patient to low-dose Lyrica  twice daily instead on 7/24.

## 2023-11-09 DIAGNOSIS — J441 Chronic obstructive pulmonary disease with (acute) exacerbation: Secondary | ICD-10-CM | POA: Diagnosis not present

## 2023-11-09 LAB — GLUCOSE, CAPILLARY
Glucose-Capillary: 193 mg/dL — ABNORMAL HIGH (ref 70–99)
Glucose-Capillary: 177 mg/dL — ABNORMAL HIGH (ref 70–99)
Glucose-Capillary: 198 mg/dL — ABNORMAL HIGH (ref 70–99)
Glucose-Capillary: 255 mg/dL — ABNORMAL HIGH (ref 70–99)

## 2023-11-09 LAB — CBC WITH DIFFERENTIAL/PLATELET
Abs Immature Granulocytes: 0.22 K/uL — ABNORMAL HIGH (ref 0.00–0.07)
Basophils Absolute: 0 K/uL (ref 0.0–0.1)
Basophils Relative: 0 %
Eosinophils Absolute: 0 K/uL (ref 0.0–0.5)
Eosinophils Relative: 0 %
HCT: 44.3 % (ref 39.0–52.0)
Hemoglobin: 13.9 g/dL (ref 13.0–17.0)
Immature Granulocytes: 2 %
Lymphocytes Relative: 4 %
Lymphs Abs: 0.4 K/uL — ABNORMAL LOW (ref 0.7–4.0)
MCH: 28.1 pg (ref 26.0–34.0)
MCHC: 31.4 g/dL (ref 30.0–36.0)
MCV: 89.5 fL (ref 80.0–100.0)
Monocytes Absolute: 0.4 K/uL (ref 0.1–1.0)
Monocytes Relative: 4 %
Neutro Abs: 8.9 K/uL — ABNORMAL HIGH (ref 1.7–7.7)
Neutrophils Relative %: 90 %
Platelets: 152 K/uL (ref 150–400)
RBC: 4.95 MIL/uL (ref 4.22–5.81)
RDW: 14.6 % (ref 11.5–15.5)
WBC: 9.9 K/uL (ref 4.0–10.5)
nRBC: 0 % (ref 0.0–0.2)

## 2023-11-09 LAB — COMPREHENSIVE METABOLIC PANEL WITH GFR
ALT: 29 U/L (ref 0–44)
AST: 15 U/L (ref 15–41)
Albumin: 2.5 g/dL — ABNORMAL LOW (ref 3.5–5.0)
Alkaline Phosphatase: 52 U/L (ref 38–126)
Anion gap: 8 (ref 5–15)
BUN: 31 mg/dL — ABNORMAL HIGH (ref 8–23)
CO2: 29 mmol/L (ref 22–32)
Calcium: 8.7 mg/dL — ABNORMAL LOW (ref 8.9–10.3)
Chloride: 100 mmol/L (ref 98–111)
Creatinine, Ser: 0.94 mg/dL (ref 0.61–1.24)
GFR, Estimated: >60 mL/min (ref >60)
Glucose, Bld: 180 mg/dL — ABNORMAL HIGH (ref 70–99)
Potassium: 4.5 mmol/L (ref 3.5–5.1)
Sodium: 137 mmol/L (ref 135–145)
Total Bilirubin: 0.4 mg/dL (ref 0.0–1.2)
Total Protein: 5.2 g/dL — ABNORMAL LOW (ref 6.5–8.1)

## 2023-11-09 LAB — MAGNESIUM: Magnesium: 2.5 mg/dL — ABNORMAL HIGH (ref 1.7–2.4)

## 2023-11-09 NOTE — Progress Notes (Signed)
 The patient ambulated with walker 100 feet, some worsening shortness of breath reported. Gait steady with walker.  Pt reports that activity was tolerated well.

## 2023-11-09 NOTE — Evaluation (Signed)
 Occupational Therapy Evaluation Patient Details Name: Mitchell [name] MRN: [id number] DOB: Jun 22, 1940 Today's Date: 11/09/2023   History of Present Illness   83 y.o. male admitted on 11/05/23 with advanced COPD and chronic respiratory failure with patient suffering from acute bronchitis secondary to metapneumovirus and rhinovirus seen on respiratory viral panel.   Past medical history significant for COPD chronically on 2-3 L nasal cannula oxygen , paroxysmal atrial fibrillation on Eliquis , CKD stage III, legal blindness recently discharged from [facility] on 7/11 after stay for acute exacerbation of COPD and acute on chronic diastolic CHF     Clinical Impressions Patient known well to this OT from recent hospital admission. Was sent home as per OT rec with [facility] services to his apartment where he has intermittent assist from ex-wife and son. Was using RW and O2 at baseline and is legally blind with low vision services recommended at last d/c as well. Currently, patient presents with deficits outlined below (see OT Problem List for details) most significantly DOE on supplemental O2 with decreased activity tolerance, LE pain, decreased balance and strength, legal blindness impacting BADL's and functional mobility. Patient requires continued Acute care hospital level OT services to progress safety and functional performance and allow for discharge. Discussed with patient potential for increased follow up to ensure patient progresses fully prior to home this episode with patient in agreement of recommendations.  Patient will benefit from continued inpatient follow up therapy, <3 hours/day.        If plan is discharge home, recommend the following:   A lot of help with bathing/dressing/bathroom;Assistance with cooking/housework;Direct supervision/assist for medications management;Direct supervision/assist for financial management;Assist for transportation;Help with stairs or ramp for entrance;A lot of help with  walking and/or transfers;Supervision due to cognitive status     Functional Status Assessment   Patient has had a recent decline in their functional status and demonstrates the ability to make significant improvements in function in a reasonable and predictable amount of time.     Equipment Recommendations   None recommended by OT      Precautions/Restrictions   Precautions Precautions: Fall Recall of Precautions/Restrictions: Intact Precaution/Restrictions Comments: currently on 3L O2 [place] at rest (baseline Home oxygen  2 lts at rest and 3 lts with activity) Restrictions Weight Bearing Restrictions Per Provider Order: No Other Position/Activity Restrictions: legally blind/HOH     Mobility Bed Mobility Overal bed mobility: Needs Assistance Bed Mobility: Supine to Sit     Supine to sit: Mod assist Sit to supine: Contact guard assist   General bed mobility comments: assist for trunk upright and assist for pt to lift to scoot out to EOB, pt requested assist donning shoes once EOB    Transfers Overall transfer level: Needs assistance Equipment used: Rolling walker (2 wheels) Transfers: Sit to/from Stand Sit to Stand: Mod assist     Step pivot transfers: Contact guard assist     General transfer comment: amb with cues for O2 tubing and obstacle navi with RW to and from bed to bathroom then to recliner      Balance Overall balance assessment: Mild deficits observed, not formally tested Sitting-balance support: No upper extremity supported, Feet supported Sitting balance-Leahy Scale: Good     Standing balance support: Bilateral upper extremity supported, Reliant on assistive device for balance Standing balance-Leahy Scale: Poor                             ADL either performed or assessed  with clinical judgement   ADL Overall ADL's : Needs assistance/impaired Eating/Feeding: Set up   Grooming: Wash/dry hands;Wash/dry face;Oral care;Applying  deodorant;Brushing hair;Sitting;Contact guard assist Grooming Details (indicate cue type and reason): including shaving Upper Body Bathing: Sitting;Contact guard assist   Lower Body Bathing: Sit to/from stand;Moderate assistance   Upper Body Dressing : Modified independent;Sitting   Lower Body Dressing: Supervision/safety;Sit to/from stand Lower Body Dressing Details (indicate cue type and reason): training with use of reacher and issued Toilet Transfer: Contact guard assist;Grab Paramedic Details (indicate cue type and reason): min cues for weightshifting to power up Toileting- Clothing Manipulation and Hygiene: With adaptive equipment;Minimal assistance;Sitting/lateral lean Toileting - Clothing Manipulation Details (indicate cue type and reason): ongoing training needed for full thorough bowel hygiene, rec bidet (low cost toilet attachment version), now using urinal with mod I     Functional mobility during ADLs: Supervision/safety;Rolling walker (2 wheels) (OT management of O2 tank for amb 150 ft this session) General ADL Comments: progressed activity tolerance with 98% on 2 ltrs O2     Vision Baseline Vision/History: 2 Legally blind Ability to See in Adequate Light: 1 Impaired Patient Visual Report: No change from baseline Vision Assessment?: Yes Additional Comments: needs services for the blind once home again            Pertinent Vitals/Pain Pain Assessment Pain Assessment: Faces Faces Pain Scale: Hurts a little bit Pain Location: B feet Pain Descriptors / Indicators: Dull, Burning Pain Intervention(s): Monitored during session, Premedicated before session, Repositioned     Extremity/Trunk Assessment Upper Extremity Assessment Upper Extremity Assessment: Generalized weakness;Right hand dominant   Lower Extremity Assessment Lower Extremity Assessment: Defer to PT evaluation RLE Sensation: history of peripheral neuropathy LLE Sensation:  history of peripheral neuropathy   Cervical / Trunk Assessment Cervical / Trunk Assessment: Normal   Communication Communication Communication: Impaired Factors Affecting Communication: Hearing impaired   Cognition Arousal: Alert Behavior During Therapy: WFL for tasks assessed/performed Cognition: Cognition impaired     Awareness: Online awareness impaired Memory impairment (select all impairments): Short-term memory Attention impairment (select first level of impairment): Selective attention Executive functioning impairment (select all impairments): Reasoning, Problem solving OT - Cognition Comments: Decreased STM, decreased insight and awareness of need for A at home                 Following commands: Intact       Cueing  General Comments   Cueing Techniques: Verbal cues  2 ltrs O2 with mild DOE and SpO2 94%, mild B Le edema with residual discoloration lower legs    Home Living Family/patient expects to be discharged to:: Private residence Living Arrangements: Alone Available Help at Discharge: Family;Available PRN/intermittently (ex-wife assist with transportation/groceries as needed) Type of Home: Apartment Home Access: Level entry     Home Layout: One level     Bathroom Shower/Tub: Chief Strategy Officer: Standard Bathroom Accessibility: Yes How Accessible: Accessible via walker Home Equipment: Rollator (4 wheels);Wheelchair - power;BSC/3in1;Cane - single point   Additional Comments: increased difficulty the past months with all aspects of IADL's and some ADL's      Prior Functioning/Environment Prior Level of Function : Needs assist       Physical Assist : Mobility (physical);ADLs (physical)     Mobility Comments: Pt using bil canes for ambulation; could ambulate short community distances prior to recent admission; reports using walker currently ADLs Comments: He was modified independent to independent with ADLs but does have  difficulty, he does not drive, and his ex-spouse stopped by regularly to assist him with cleaning, laundry, and taking him to appointments.    OT Problem List: Decreased strength;Decreased activity tolerance;Impaired balance (sitting and/or standing);Impaired vision/perception;Decreased safety awareness;Decreased knowledge of use of DME or AE;Decreased knowledge of precautions;Cardiopulmonary status limiting activity;Impaired sensation;Pain;Increased edema;Obesity   OT Treatment/Interventions: Self-care/ADL training;Therapeutic exercise;Neuromuscular education;Energy conservation;DME and/or AE instruction;Therapeutic activities;Visual/perceptual remediation/compensation;Patient/family education;Balance training      OT Goals(Current goals can be found in the care plan section)   Acute Rehab OT Goals Patient Stated Goal: to get services I need this time OT Goal Formulation: With patient Time For Goal Achievement: 11/23/23 Potential to Achieve Goals: Fair   OT Frequency:  Min 2X/week       AM-PAC OT 6 Clicks Daily Activity     Outcome Measure Help from another person eating meals?: A Little Help from another person taking care of personal grooming?: A Little Help from another person toileting, which includes using toliet, bedpan, or urinal?: A Little Help from another person bathing (including washing, rinsing, drying)?: A Lot Help from another person to put on and taking off regular upper body clothing?: A Little Help from another person to put on and taking off regular lower body clothing?: A Lot 6 Click Score: 16   End of Session Equipment Utilized During Treatment: Gait belt;Rolling walker (2 wheels);Oxygen  Nurse Communication: Mobility status  Activity Tolerance: Patient tolerated treatment well Patient left: with call bell/phone within reach;in chair;with chair alarm set (wanted to rest before discharge)  OT Visit Diagnosis: Unsteadiness on feet (R26.81);Muscle weakness  (generalized) (M62.81);Low vision, both eyes (H54.2);Pain Pain - part of body:  (feet due to PN)                Time: 9049-8971 OT Time Calculation (min): 38 min Charges:  OT General Charges $OT Visit: 1 Visit OT Evaluation $OT Eval Low Complexity: 1 Low OT Treatments $Self Care/Home Management : 8-22 mins $Therapeutic Activity: 8-22 mins Rondia Higginbotham OT/L Acute Rehabilitation Department  [phone]  11/09/2023, 11:33 AM

## 2023-11-09 NOTE — Assessment & Plan Note (Signed)
 Obesity certainly plays a role in patients overall difficulty with managing his respiratory illness although I don't believe it is severe enough to the point of obesity hypoventilation syndrome Will counsel the patient on caloric restriction and regular physical activity as the patient recovers.

## 2023-11-09 NOTE — Progress Notes (Signed)
 Progress Note   Patient: Mitchell [name] FMW:[id number] DOB: 1941-02-02 DOA: 11/05/2023     3 DOS: the patient was seen and examined on 11/09/2023   Brief hospital course: 83yo with h/o COPD on 2L home O2, afib on Eliquis , stage 3a CKD, and legal blindness who presented on 7/22 with SOB and hypoxia to 82% despite [place] O2.  He was found to have rhinovirus and metapneumovirus and treated with Levaquin .  He has been recommended for SNF rehab but is currently declining, preferring to go home.  Assessment and Plan:  COPD with acute exacerbation associated with Rhinovirus and human metapneumovirus (hMPV) Complex presentation in patient with advanced COPD and chronic respiratory failure Patient suffering from acute respiratory failure with O2 sats to 82% despite home O2 Metapneumovirus and rhinovirus seen on respiratory viral panel Patient also found to have bibasilar infiltrates on CT with likely superimposed bronchiectasis  Continue day 3 of levofloxacin  500 mg daily for total of 5 days to cover for all usual organisms associated with acute bacterial bronchitis and COPD and organisms associated with acute bronchiectasis (pseudomonas) Obtaining SLP evaluation to ensure patient is not suffering from bouts of aspiration Continue systemic steroids  Continue aggressive bronchodilator therapy Continue Pulmicort  nebulized twice daily Acute on chronic hypoxic respiratory failure resolved -- supplemental oxygen  weaned back to baseline of 2 L/min via [place]. Hypertonic saline initiated 7/24  Continuing flutter valve therapy and IS   Peripheral polyneuropathy According to the son patient has been suffering from polyneuropathy and has been placed on gabapentin  several months ago for management of this Since then, son reports that his father has been extremely lethargic and does not want his home regimen of gabapentin  resumed Transitioned to low-dose Lyrica  twice daily instead on 7/24  BPH without urinary  obstruction or lower urinary tract symptoms Continue Proscar   Mixed hyperlipidemia Continue pravastatin   Paroxysmal atrial fibrillation  Rate controlled on Toprol  XL Continue outpatient regimen of Eliquis   GERD without esophagitis Continue proton pump inhibitor  Acute diarrhea Patient complaining of longstanding loose stools Abdomen nontender C. difficile testing negative, GI pathogen panel negative  Prediabetes Known history of prediabetes  A1c 6.3 Cover with moderate-scale SSI Carb modified diet   Abnormal CT scan, chest CT scan performed 7/24 revealed possible partial obstruction/scarring of right brachiocephalic vein This is unlikely to be clinically significant as patient is already on Eliquis  therapy for atrial fibrillation No concerning associated physical exam findings on exam of the right neck or right upper extremity  Disposition Patient is nearing medical stability for discharge He has been recommended for STR He reports prior dc with HH therapy but the agency did not show up and he thinks this is why he had to come back He is insistent about dc to home with [facility] therapy with a different agent and is refusing SNF rehab at this time Will encourage ambulation today, likely dc to home on 7/27  Class 1 obesity due to excess calories with serious comorbidity and body mass index (BMI) of 30.0 to 30.9 in adult Body mass index is 30.79 kg/m.SABRA  Weight loss should be encouraged Outpatient PCP/bariatric medicine f/u encouraged Significantly low or high BMI is associated with higher medical risk including morbidity and mortality      Consultants: RT PT OT  Procedures: None  Antibiotics: Levaquin  7/24-29  30 Day Unplanned Readmission Risk Score    Flowsheet Row ED to Hosp-Admission (Current) from 11/05/2023 in [facility]  30 Day Unplanned Readmission Risk Score (%)  30.74 Filed at 11/09/2023 0400    This score is the patient's risk of an  unplanned readmission within 30 days of being discharged (0 -100%). The score is based on dignosis, age, lab data, medications, orders, and past utilization.   Low:  0-14.9   Medium: 15-21.9   High: 22-29.9   Extreme: 30 and above           Subjective: Provided a lot of historical information but little about his current state.  Unwilling to go to STR.  Generally feeling ok medically but he wants to get stronger prior to dc.   Objective: Vitals:   11/09/23 1342 11/09/23 1450  BP: 132/80   Pulse: 70   Resp: 16   Temp: 97.7 F (36.5 C)   SpO2: 93% 97%    Intake/Output Summary (Last 24 hours) at 11/09/2023 1602 Last data filed at 11/09/2023 1200 Gross per 24 hour  Intake 480 ml  Output 500 ml  Net -20 ml   Filed Weights   11/05/23 1102  Weight: 103 kg    Exam:  General:  Appears calm and comfortable and is in NAD, very conversant Eyes:  normal lids, iris ENT:  grossly normal hearing, lips & tongue, mmm Cardiovascular:  RRR. No LE edema.  Respiratory:   R-sided rhonchi.  Normal respiratory effort.  On home 2L [place] O2 Abdomen:  soft, NT, ND Skin:  no rash or induration seen on limited exam Musculoskeletal:  grossly normal tone BUE/BLE, good ROM, no bony abnormality Psychiatric:  grossly normal mood and affect, speech fluent and appropriate, AOx3 Neurologic:  CN 2-12 grossly intact, moves all extremities in coordinated fashion  Data Reviewed: I have reviewed the patient's lab results since admission.  Pertinent labs for today include:   Glucose 180 BUN 31/Creatinine 0.94/GFR >60 Normal CBC A1c 6.3    Family Communication: None present; I spoke with his son by telephone  Disposition: Status is: Inpatient Remains inpatient appropriate because: ongoing monitoring     Time spent: 50 minutes  Unresulted Labs (From admission, onward)     Start     Ordered   11/10/23 0500  CBC with Differential/Platelet  Tomorrow morning,   R        11/09/23 1602   11/07/23  2257  Expectorated Sputum Assessment w Gram Stain, Rflx to Resp Cult  Once,   R        11/07/23 2256             Author: Delon Herald, MD 11/09/2023 4:02 PM  For on call review [email or web address].

## 2023-11-09 NOTE — Evaluation (Signed)
 Clinical/Bedside Swallow Evaluation Patient Details  Name: Mitchell [name] MRN: [id number] Date of Birth: April 18, 1940  Today's Date: 11/09/2023 Time: SLP Start Time (ACUTE ONLY): 1653 SLP Stop Time (ACUTE ONLY): 1715 SLP Time Calculation (min) (ACUTE ONLY): 22 min  Past Medical History:  Past Medical History:  Diagnosis Date   Anxiety    BPH (benign prostatic hypertrophy)    CAD (coronary artery disease) 2007   with LAD/Diag CBPTCA and kissing    CKD (chronic kidney disease), stage III (HCC)    thelbert 01/04/2017   COPD (chronic obstructive pulmonary disease) (HCC)    maybe (02/14/2012)   Depression    GERD (gastroesophageal reflux disease)    Hyperlipidemia    Hypertension    Hypoxemia    Myocardial infarction (HCC) 01/22/2012   NSTEMI with DES to RSA (PDA)   On home oxygen  therapy    Shortness of breath    walking and laying down sometimes (02/14/2012)   Past Surgical History:  Past Surgical History:  Procedure Laterality Date   CATARACT EXTRACTION W/ INTRAOCULAR LENS  IMPLANT, BILATERAL  ~ 2010   CORONARY ANGIOPLASTY WITH STENT PLACEMENT  05/11/2005   PCI OF LAD   GANGLION CYST REMOVED  1956?   right   LEFT HEART CATHETERIZATION WITH CORONARY ANGIOGRAM Bilateral 01/24/2012   Procedure: LEFT HEART CATHETERIZATION WITH CORONARY ANGIOGRAM;  Surgeon: Victory LELON Claudene DOUGLAS, MD;  [facility] C[facility] LAB;  Service: Cardiovascular;  Laterality: Bilateral;   REFRACTIVE SURGERY  2010   left   HPI:  Patient is an 83 y.o. male with PMH: COPD, chronically on 2L via [place], afib, CKD stage III, legal blindness, recently discharged from [facility] after stay for acute COPD exacerbation. He presented back to the hospital on 11/05/23 with progressive SOB, having a constant cough. EMS reported oxygen  saturations at 82% on his baseline 2L oxygen . SLP swallow evaluation ordered on 11/08/23 to ensure patient is not suffering from bouts of aspiration.    Assessment / Plan / Recommendation  Clinical  Impression  As per patient's report and today's bedside swallow assessment, patient presents with questionable dysphagia. He indicated that approximately 4-5 months ago he had an incident where a pill got stuck when swallowing it and it kept him  up all night. Since then he has been more careful and has noticed that he needs to be careful to not eat too fast, to avoid certain solid food textures (meats, etc). He does indicate globus sensation with some pills and solid foods. SLP assessed his swallow function as he took straw sips of thin liquids. No overt s/s aspiration and swallow initiation did appear timely but reduced hyolaryngeal movement observed. SLP discussed MBS and as patient has been hospitalized for the third time now, he would like to r/o dysphagia as a potential cause. Plan is for MBS on Monday. SLP Visit Diagnosis: Dysphagia, unspecified (R13.10)    Aspiration Risk  Mild aspiration risk    Diet Recommendation Regular;Thin liquid    Liquid Administration via: Cup;Straw Medication Administration: Whole meds with liquid Supervision: Patient able to self feed Postural Changes: Seated upright at 90 degrees    Other  Recommendations Oral Care Recommendations: Oral care BID     Assistance Recommended at Discharge    Functional Status Assessment Patient has had a recent decline in their functional status and demonstrates the ability to make significant improvements in function in a reasonable and predictable amount of time.  Frequency and Duration min 1 x/week  1 week  Prognosis Prognosis for improved oropharyngeal function: Good      Swallow Study   General Date of Onset: 11/08/23 HPI: Patient is an 83 y.o. male with PMH: COPD, chronically on 2L via [place], afib, CKD stage III, legal blindness, recently discharged from [facility] after stay for acute COPD exacerbation. He presented back to the hospital on 11/05/23 with progressive SOB, having a constant cough. EMS reported oxygen   saturations at 82% on his baseline 2L oxygen . SLP swallow evaluation ordered on 11/08/23 to ensure patient is not suffering from bouts of aspiration. Type of Study: Bedside Swallow Evaluation Previous Swallow Assessment: none found Diet Prior to this Study: Regular;Thin liquids (Level 0) Temperature Spikes Noted: No Respiratory Status: Nasal cannula History of Recent Intubation: No Behavior/Cognition: Alert;Cooperative;Pleasant mood Oral Cavity Assessment: Within Functional Limits Oral Care Completed by SLP: No Oral Cavity - Dentition: Adequate natural dentition Vision: Functional for self-feeding Self-Feeding Abilities: Able to feed self Patient Positioning: Upright in bed Baseline Vocal Quality: Wet Volitional Cough: Strong Volitional Swallow: Able to elicit    Oral/Motor/Sensory Function Overall Oral Motor/Sensory Function: Within functional limits   Ice Chips     Thin Liquid Thin Liquid: Within functional limits Presentation: Self Fed    Nectar Thick     Honey Thick     Puree Puree: Not tested   Solid     Solid: Not tested      Norleen IVAR Blase, MA, CCC-SLP Speech Therapy

## 2023-11-10 DIAGNOSIS — J441 Chronic obstructive pulmonary disease with (acute) exacerbation: Secondary | ICD-10-CM | POA: Diagnosis not present

## 2023-11-10 LAB — CBC WITH DIFFERENTIAL/PLATELET
Abs Immature Granulocytes: 0.39 K/uL — ABNORMAL HIGH (ref 0.00–0.07)
Basophils Absolute: 0.1 K/uL (ref 0.0–0.1)
Basophils Relative: 1 %
Eosinophils Absolute: 0.3 K/uL (ref 0.0–0.5)
Eosinophils Relative: 3 %
HCT: 43.5 % (ref 39.0–52.0)
Hemoglobin: 14.2 g/dL (ref 13.0–17.0)
Immature Granulocytes: 3 %
Lymphocytes Relative: 4 %
Lymphs Abs: 0.5 K/uL — ABNORMAL LOW (ref 0.7–4.0)
MCH: 29.1 pg (ref 26.0–34.0)
MCHC: 32.6 g/dL (ref 30.0–36.0)
MCV: 89.1 fL (ref 80.0–100.0)
Monocytes Absolute: 0.6 K/uL (ref 0.1–1.0)
Monocytes Relative: 5 %
Neutro Abs: 9.6 K/uL — ABNORMAL HIGH (ref 1.7–7.7)
Neutrophils Relative %: 84 %
Platelets: 166 K/uL (ref 150–400)
RBC: 4.88 MIL/uL (ref 4.22–5.81)
RDW: 14.8 % (ref 11.5–15.5)
WBC: 11.3 K/uL — ABNORMAL HIGH (ref 4.0–10.5)
nRBC: 0.2 % (ref 0.0–0.2)

## 2023-11-10 LAB — GLUCOSE, CAPILLARY
Glucose-Capillary: 181 mg/dL — ABNORMAL HIGH (ref 70–99)
Glucose-Capillary: 204 mg/dL — ABNORMAL HIGH (ref 70–99)
Glucose-Capillary: 192 mg/dL — ABNORMAL HIGH (ref 70–99)
Glucose-Capillary: 210 mg/dL — ABNORMAL HIGH (ref 70–99)

## 2023-11-10 LAB — CULTURE, BLOOD (ROUTINE X 2)
Culture: NO GROWTH
Culture: NO GROWTH
Special Requests: ADEQUATE

## 2023-11-10 NOTE — Progress Notes (Signed)
 Progress Note   Patient: Mitchell [name] FMW:[id number] DOB: 09-17-1940 DOA: 11/05/2023     4 DOS: the patient was seen and examined on 11/10/2023   Brief hospital course: 83yo with h/o COPD on 2L home O2, afib on Eliquis , stage 3a CKD, and legal blindness who presented on 7/22 with SOB and hypoxia to 82% despite [place] O2.  He was found to have rhinovirus and metapneumovirus and treated with Levaquin .  He has been recommended for SNF rehab but is currently declining, preferring to go home.  Assessment and Plan:  COPD with acute exacerbation associated with Rhinovirus and human metapneumovirus (hMPV) Complex presentation in patient with advanced COPD and chronic respiratory failure Patient suffering from acute respiratory failure with O2 sats to 82% despite home O2 Metapneumovirus and rhinovirus seen on respiratory viral panel Patient also found to have bibasilar infiltrates on CT with likely superimposed bronchiectasis  Completes levofloxacin  500 mg daily for total of 5 days on 7/28 Obtaining SLP evaluation to ensure patient is not suffering from bouts of aspiration; he is scheduled for MBS on 7/28 Continue systemic steroids  Continue aggressive bronchodilator therapy Continue Pulmicort  nebulized twice daily Acute on chronic hypoxic respiratory failure resolved -- supplemental oxygen  weaned back to baseline of 2 L/min via [place]. Hypertonic saline initiated 7/24  Continuing flutter valve therapy and IS   Peripheral polyneuropathy According to the son patient has been suffering from polyneuropathy and has been placed on gabapentin  several months ago for management of this Since then, son reports that his father has been extremely lethargic and does not want his home regimen of gabapentin  resumed Transitioned to low-dose Lyrica  twice daily instead on 7/24   BPH without urinary obstruction or lower urinary tract symptoms Continue Proscar    Mixed hyperlipidemia Continue pravastatin     Paroxysmal atrial fibrillation  Rate controlled on Toprol  XL Continue outpatient regimen of Eliquis    GERD without esophagitis; dysphagia Continue proton pump inhibitor He is planned for MBS on 7/28 by SLP and can likely dc to home after   Acute diarrhea Patient complaining of longstanding loose stools Abdomen nontender C. difficile testing negative, GI pathogen panel negative   Prediabetes Known history of prediabetes  A1c 6.3 Cover with moderate-scale SSI Carb modified diet    Abnormal CT scan, chest CT scan performed 7/24 revealed possible partial obstruction/scarring of right brachiocephalic vein This is unlikely to be clinically significant as patient is already on Eliquis  therapy for atrial fibrillation No concerning associated physical exam findings on exam of the right neck or right upper extremity   Disposition Patient is nearing medical stability for discharge He has been recommended for STR He reports prior dc with HH therapy but the agency did not show up and he thinks this is why he had to come back He is insistent about dc to home with [facility] therapy with a different agent and is refusing SNF rehab at this time Will encourage ambulation today, likely dc to home on 7/28   Class 1 obesity due to excess calories with serious comorbidity and body mass index (BMI) of 30.0 to 30.9 in adult Body mass index is 30.79 kg/m.SABRA  Weight loss should be encouraged Outpatient PCP/bariatric medicine f/u encouraged Significantly low or high BMI is associated with higher medical risk including morbidity and mortality          Consultants: RT PT OT SLP   Procedures: None   Antibiotics: Levaquin  7/24-28     30 Day Unplanned Readmission Risk Score  Flowsheet Row ED to Hosp-Admission (Current) from 11/05/2023 in [place] 6 EAST ONCOLOGY  30 Day Unplanned Readmission Risk Score (%) 31.11 Filed at 11/10/2023 0401    This score is the patient's risk of an unplanned  readmission within 30 days of being discharged (0 -100%). The score is based on dignosis, age, lab data, medications, orders, and past utilization.   Low:  0-14.9   Medium: 15-21.9   High: 22-29.9   Extreme: 30 and above           Subjective: Some productive cough, on home O2, ambulates in the halls several times yesterday.  Planned for MBS tomorrow.  Understands that the plan is for dc afterwards.   Objective: Vitals:   11/10/23 0809 11/10/23 1135  BP:    Pulse:    Resp:    Temp:    SpO2: 98% 95%    Intake/Output Summary (Last 24 hours) at 11/10/2023 1325 Last data filed at 11/10/2023 0455 Gross per 24 hour  Intake 240 ml  Output 700 ml  Net -460 ml   Filed Weights   11/05/23 1102  Weight: 103 kg    Exam:  General:  Appears calm and comfortable and is in NAD, very conversant Eyes:  normal lids, iris ENT:  grossly normal hearing, lips & tongue, mmm Cardiovascular:  RRR. No LE edema.  Respiratory:   R-sided rhonchi.  Normal respiratory effort.  On home 2L [place] O2 Abdomen:  soft, NT, ND Skin:  no rash or induration seen on limited exam Musculoskeletal:  grossly normal tone BUE/BLE, good ROM, no bony abnormality Psychiatric:  grossly normal mood and affect, speech fluent and appropriate, AOx3 Neurologic:  CN 2-12 grossly intact, moves all extremities in coordinated fashion  Data Reviewed: I have reviewed the patient's lab results since admission.  Pertinent labs for today include:   WBC 11.3     Family Communication: None present  Disposition: Status is: Inpatient Remains inpatient appropriate because: ongoing monitoring     Time spent: 50 minutes  Unresulted Labs (From admission, onward)     Start     Ordered   11/11/23 0500  CBC with Differential/Platelet  Tomorrow morning,   R        11/10/23 0736   11/11/23 0500  Comprehensive metabolic panel with GFR  Tomorrow morning,   R        11/10/23 0736   11/07/23 2257  Expectorated Sputum Assessment w  Gram Stain, Rflx to Resp Cult  Once,   R        11/07/23 2256             Author: Delon Herald, MD 11/10/2023 1:25 PM  For on call review [email or web address].

## 2023-11-10 NOTE — Progress Notes (Signed)
 Mobility Specialist - Progress Note   11/10/23 1029  Oxygen  Therapy  O2 Device Nasal Cannula  O2 Flow Rate (L/min) 2 L/min  Patient Activity (if Appropriate) Ambulating  Mobility  Activity Ambulated with assistance in hallway;Ambulated with assistance to bathroom  Level of Assistance Standby assist, set-up cues, supervision of patient - no hands on  Assistive Device Front wheel walker  Distance Ambulated (ft) 250 ft  Activity Response Tolerated well  Mobility Referral Yes  Mobility visit 1 Mobility  Mobility Specialist Start Time (ACUTE ONLY) 1002  Mobility Specialist Stop Time (ACUTE ONLY) 1028  Mobility Specialist Time Calculation (min) (ACUTE ONLY) 26 min   Pt received in bed and agreeable to mobility. Prior to ambulating, pt requested assistance to the bathroom. Took x1 standing rest break during ambulation d/t SOB. No complaints during session. Pt to recliner after session with all needs met.    [facility]

## 2023-11-11 ENCOUNTER — Inpatient Hospital Stay (HOSPITAL_COMMUNITY)

## 2023-11-11 ENCOUNTER — Ambulatory Visit: Admitting: Cardiology

## 2023-11-11 LAB — CBC WITH DIFFERENTIAL/PLATELET
Abs Immature Granulocytes: 0.77 K/uL — ABNORMAL HIGH (ref 0.00–0.07)
Basophils Absolute: 0.1 K/uL (ref 0.0–0.1)
Basophils Relative: 1 %
Eosinophils Absolute: 0 K/uL (ref 0.0–0.5)
Eosinophils Relative: 0 %
HCT: 42.9 % (ref 39.0–52.0)
Hemoglobin: 13.6 g/dL (ref 13.0–17.0)
Immature Granulocytes: 6 %
Lymphocytes Relative: 4 %
Lymphs Abs: 0.5 K/uL — ABNORMAL LOW (ref 0.7–4.0)
MCH: 28.1 pg (ref 26.0–34.0)
MCHC: 31.7 g/dL (ref 30.0–36.0)
MCV: 88.6 fL (ref 80.0–100.0)
Monocytes Absolute: 0.6 K/uL (ref 0.1–1.0)
Monocytes Relative: 5 %
Neutro Abs: 10.6 K/uL — ABNORMAL HIGH (ref 1.7–7.7)
Neutrophils Relative %: 84 %
Platelets: 205 K/uL (ref 150–400)
RBC: 4.84 MIL/uL (ref 4.22–5.81)
RDW: 14.8 % (ref 11.5–15.5)
WBC: 12.6 K/uL — ABNORMAL HIGH (ref 4.0–10.5)
nRBC: 0.2 % (ref 0.0–0.2)

## 2023-11-11 LAB — COMPREHENSIVE METABOLIC PANEL WITH GFR
ALT: 36 U/L (ref 0–44)
AST: 20 U/L (ref 15–41)
Albumin: 2.2 g/dL — ABNORMAL LOW (ref 3.5–5.0)
Alkaline Phosphatase: 49 U/L (ref 38–126)
Anion gap: 7 (ref 5–15)
BUN: 41 mg/dL — ABNORMAL HIGH (ref 8–23)
CO2: 30 mmol/L (ref 22–32)
Calcium: 8.6 mg/dL — ABNORMAL LOW (ref 8.9–10.3)
Chloride: 97 mmol/L — ABNORMAL LOW (ref 98–111)
Creatinine, Ser: 1.23 mg/dL (ref 0.61–1.24)
GFR, Estimated: 58 mL/min — ABNORMAL LOW (ref >60)
Glucose, Bld: 211 mg/dL — ABNORMAL HIGH (ref 70–99)
Potassium: 4.9 mmol/L (ref 3.5–5.1)
Sodium: 134 mmol/L — ABNORMAL LOW (ref 135–145)
Total Bilirubin: 0.7 mg/dL (ref 0.0–1.2)
Total Protein: 4.6 g/dL — ABNORMAL LOW (ref 6.5–8.1)

## 2023-11-11 LAB — GLUCOSE, CAPILLARY
Glucose-Capillary: 186 mg/dL — ABNORMAL HIGH (ref 70–99)
Glucose-Capillary: 178 mg/dL — ABNORMAL HIGH (ref 70–99)
Glucose-Capillary: 245 mg/dL — ABNORMAL HIGH (ref 70–99)
Glucose-Capillary: 204 mg/dL — ABNORMAL HIGH (ref 70–99)

## 2023-11-11 NOTE — Progress Notes (Signed)
 Progress Note   Patient: Mitchell [name] FMW:[id number] DOB: 09-01-1940 DOA: 11/05/2023     5 DOS: the patient was seen and examined on 11/11/2023   Brief hospital course: 83yo with h/o COPD on 2L home O2, afib on Eliquis , stage 3a CKD, and legal blindness who presented on 7/22 with SOB and hypoxia to 82% despite [place] O2.  He was found to have rhinovirus and metapneumovirus and treated with Levaquin .  He has been recommended for SNF rehab but is currently declining, preferring to go home.  Assessment and Plan:  COPD with acute exacerbation associated with Rhinovirus and human metapneumovirus (hMPV) Complex presentation in patient with advanced COPD and chronic respiratory failure Patient suffering from acute respiratory failure with O2 sats to 82% despite home O2 Metapneumovirus and rhinovirus seen on respiratory viral panel Patient also found to have bibasilar infiltrates on CT with likely superimposed bronchiectasis  Completes levofloxacin  500 mg daily for total of 5 days on 7/28 Obtaining SLP evaluation to ensure patient is not suffering from bouts of aspiration;MBS on 7/28, speech therapy recommends continue with oral diet, continue with precautions such as small bites, chew food well, eat slow.  Continue systemic steroids, decrease IV Solu-Medrol  to once daily Continue aggressive bronchodilator therapy Continue Pulmicort  nebulized twice daily Acute on chronic hypoxic respiratory failure resolved -- supplemental oxygen  weaned back to baseline of 2 L/min via [place]. Hypertonic saline initiated 7/24  Continuing flutter valve therapy and IS   Peripheral polyneuropathy According to the son patient has been suffering from polyneuropathy and has been placed on gabapentin  several months ago for management of this Since then, son reports that his father has been extremely lethargic and does not want his home regimen of gabapentin  resumed Transitioned to low-dose Lyrica  twice daily instead on 7/24    BPH without urinary obstruction or lower urinary tract symptoms Continue Proscar    Mixed hyperlipidemia Continue pravastatin    Paroxysmal atrial fibrillation  Rate controlled on Toprol  XL Continue outpatient regimen of Eliquis    GERD without esophagitis; dysphagia Continue proton pump inhibitor   Acute diarrhea Patient complaining of longstanding loose stools Abdomen nontender C. difficile testing negative, GI pathogen panel negative   Prediabetes Known history of prediabetes  A1c 6.3 Cover with moderate-scale SSI Carb modified diet    Abnormal CT scan, chest CT scan performed 7/24 revealed possible partial obstruction/scarring of right brachiocephalic vein This is unlikely to be clinically significant as patient is already on Eliquis  therapy for atrial fibrillation No concerning associated physical exam findings on exam of the right neck or right upper extremity   Disposition Patient is nearing medical stability for discharge He has been recommended for STR He reports prior dc with HH therapy but the agency did not show up and he thinks this is why he had to come back He is insistent about dc to home with [facility] therapy with a different agent and is refusing SNF rehab at this time Plan to DC patient home tomorrow, preferably early morning as per patient   Class 1 obesity due to excess calories with serious comorbidity and body mass index (BMI) of 30.0 to 30.9 in adult Body mass index is 30.79 kg/m.SABRA  Weight loss should be encouraged Outpatient PCP/bariatric medicine f/u encouraged Significantly low or high BMI is associated with higher medical risk including morbidity and mortality          Consultants: RT PT OT SLP   Procedures: None   Antibiotics: Levaquin  7/24-28     30 Day  Unplanned Readmission Risk Score    Flowsheet Row ED to Hosp-Admission (Current) from 11/05/2023 in [place] 6 EAST ONCOLOGY  30 Day Unplanned Readmission Risk Score (%) 31.11  Filed at 11/10/2023 0401    This score is the patient's risk of an unplanned readmission within 30 days of being discharged (0 -100%). The score is based on dignosis, age, lab data, medications, orders, and past utilization.   Low:  0-14.9   Medium: 15-21.9   High: 22-29.9   Extreme: 30 and above           Subjective:   Continues to report feeling generalized weakness, some coughing and wheezing.  Does not think that he is safe to go home yet.  However not wanting to go to any rehab either.  Objective: Vitals:   11/11/23 1345 11/11/23 1545  BP: 134/75   Pulse: 73   Resp: 18   Temp: 97.7 F (36.5 C)   SpO2: 99% 97%    Intake/Output Summary (Last 24 hours) at 11/11/2023 1750 Last data filed at 11/11/2023 1200 Gross per 24 hour  Intake 720 ml  Output 850 ml  Net -130 ml   Filed Weights   11/05/23 1102  Weight: 103 kg    Exam:  General:  Appears calm and comfortable and is in NAD, very conversant Eyes:  normal lids, iris ENT:  grossly normal hearing, lips & tongue, mmm Cardiovascular:  RRR. No LE edema.  Respiratory:   R-sided rhonchi.  Normal respiratory effort.  On home 2L [place] O2 Abdomen:  soft, NT, ND Skin:  no rash or induration seen on limited exam Musculoskeletal:  grossly normal tone BUE/BLE, good ROM, no bony abnormality Psychiatric:  grossly normal mood and affect, speech fluent and appropriate, AOx3 Neurologic:  CN 2-12 grossly intact, moves all extremities in coordinated fashion  Data Reviewed: I have reviewed the patient's lab results since admission.  Pertinent labs for today include:   WBC 11.3     Family Communication: None present  Disposition: Status is: Inpatient Remains inpatient appropriate because: ongoing monitoring     Time spent: 50 minutes  Unresulted Labs (From admission, onward)     Start     Ordered   11/07/23 2257  Expectorated Sputum Assessment w Gram Stain, Rflx to Resp Cult  Once,   R        11/07/23 2256              Author: MARGARETMARY DELENA HALEY, MD 11/11/2023 5:50 PM  For on call review [email or web address].

## 2023-11-11 NOTE — Progress Notes (Signed)
 PT Cancellation Note  Patient Details Name: Mitchell [name] MRN: [id number] DOB: [birth year]   Cancelled Treatment:    Reason Eval/Treat Not Completed: Other (comment). Pt reported being frustrated and upset today and did not want to aggravate his breathing by getting up and moving. Pt declined therapy including getting OOB to finish eating. Pt reports he is going home tomorrow and will get plenty of exercise at home. PT to continue to follow acutely as pt remains in hospital.   [place], PT Acute Rehab   [place] VEAR Drone 11/11/2023, 4:54 PM

## 2023-11-11 NOTE — Procedures (Signed)
 Modified Barium Swallow Study  Patient Details  Name: Mitchell [name] MRN: [id number] Date of Birth: [birth year]  Today's Date: 11/11/2023  Modified Barium Swallow completed.  Full report located under Chart Review in the Imaging Section.  History of Present Illness Patient is an 83 y.o. male with PMH: COPD, chronically on 2L via [place], afib, CKD stage III, legal blindness, recently discharged from [facility] after stay for acute COPD exacerbation. He presented back to the hospital on 11/05/23 with progressive SOB, having a constant cough. EMS reported oxygen  saturations at 82% on his baseline 2L oxygen . SLP swallow evaluation ordered on 11/08/23 to ensure patient is not suffering from bouts of aspiration.   Clinical Impression Patient presents with a mild oropharyngeal dysphagia as per this MBS. Prominent cricopharyngeal bar observed (no radiologist present to confirm), which did result in barium briefly becoming lodged above cp bar during transit. Swallow initiated at level of pyriform sinus with thin liquids and at level of vallecular sinus with thicker liquids and puree solids. When trying to swallow 13mm barium tablet, patient unable to maintain cohesive bolus with thin liquid barium. This resulted in pill remaining in oral cavity, and patient exhibiting penetration to the vocal cords (PAS 5).  No other instances of penetration or aspiration occured. Patient indicated that he crushes his pills up and does not take them whole at baseline. SLP recommending patient continue on current PO diet, continue to follow the precautions he has told SLP he follows (small bites, chews food well, takes his time, etc.) No further skilled SLP intevention warranted at this time. Factors that may increase risk of adverse event in presence of aspiration Mitchell [name] & Mitchell [name] 2021): Frail or deconditioned;Poor general health and/or compromised immunity  Swallow Evaluation Recommendations Recommendations: PO diet PO Diet  Recommendation: Thin liquids (Level 0);Regular Liquid Administration via: Cup;Straw Medication Administration: Other (Comment) (whole or crushed in puree) Supervision: Patient able to self-feed Swallowing strategies  : Small bites/sips;Slow rate Postural changes: Position pt fully upright for meals Oral care recommendations: Oral care BID (2x/day)     Mitchell IVAR Blase, MA, CCC-SLP Speech Therapy

## 2023-11-11 NOTE — TOC Progression Note (Addendum)
 Transition of Care [facility]) - Progression Note    Patient Details  Name: Mitchell [name] MRN: [id number] Date of Birth: 01/27/1941  Transition of Care [facility]) CM/SW Contact  Toy LITTIE Agar, RN Phone [phone]  11/11/2023, 3:07 PM  Clinical Narrative:    Patient has SNF recommendation. Son states that patient has someone with him 24/7 and he will not put him in nursing home. Brother to come from [place] to help son Elsie to take care of the patient. Son states that patient patient has [facility] that has been ordered and son will pick it up.IP care management will continue to follow.      Barriers to Discharge: Continued Medical Work up               Expected Discharge Plan and Services                                               Social Drivers of Health (SDOH) Interventions SDOH Screenings   Food Insecurity: Food Insecurity Present (11/05/2023)  Housing: Low Risk  (11/05/2023)  Transportation Needs: Unmet Transportation Needs (11/05/2023)  Utilities: At Risk (11/05/2023)  Alcohol  Screen: Low Risk  (08/24/2021)  Depression (PHQ2-9): Low Risk  (02/07/2019)  Social Connections: Socially Isolated (11/05/2023)  Tobacco Use: Medium Risk (11/05/2023)    Readmission Risk Interventions    09/17/2023   12:02 PM  Readmission Risk Prevention Plan  Transportation Screening Complete  PCP or Specialist Appt within 3-5 Days Complete  HRI or Home Care Consult Complete  Social Work Consult for Recovery Care Planning/Counseling Complete  Palliative Care Screening Not Applicable  Medication Review [place]) Complete

## 2023-11-11 NOTE — Plan of Care (Signed)
   Problem: Education: Goal: Knowledge of General Education information will improve Description Including pain rating scale, medication(s)/side effects and non-pharmacologic comfort measures Outcome: Progressing

## 2023-11-12 DIAGNOSIS — J441 Chronic obstructive pulmonary disease with (acute) exacerbation: Secondary | ICD-10-CM | POA: Diagnosis not present

## 2023-11-12 LAB — GLUCOSE, CAPILLARY
Glucose-Capillary: 194 mg/dL — ABNORMAL HIGH (ref 70–99)
Glucose-Capillary: 193 mg/dL — ABNORMAL HIGH (ref 70–99)
Glucose-Capillary: 255 mg/dL — ABNORMAL HIGH (ref 70–99)
Glucose-Capillary: 226 mg/dL — ABNORMAL HIGH (ref 70–99)

## 2023-11-12 MED ORDER — PREDNISONE 20 MG PO TABS
40.0000 mg | ORAL_TABLET | Freq: Every day | ORAL | Status: DC
  Administered 2023-11-13 – 2023-11-14 (×2): 40 mg via ORAL
  Filled 2023-11-12 (×2): qty 2

## 2023-11-12 NOTE — Progress Notes (Signed)
 Physical Therapy Treatment Patient Details Name: ALEXX MCBURNEY MRN: [id number] DOB: 1941/03/14 Today's Date: 11/12/2023   History of Present Illness 83 y.o. male admitted on 11/05/23 with advanced COPD and chronic respiratory failure with patient suffering from acute bronchitis secondary to metapneumovirus and rhinovirus seen on respiratory viral panel.   Past medical history significant for COPD chronically on 2-3 L nasal cannula oxygen , paroxysmal atrial fibrillation on Eliquis , CKD stage III, legal blindness recently discharged from [facility] on 7/11 after stay for acute exacerbation of COPD and acute on chronic diastolic CHF    PT Comments  Patient ambulated x 110 using a [place]w, spo2 on 2 L, 95%. Patient plans to return home, can benefit from increased level of care.     If plan is discharge home, recommend the following: A little help with walking and/or transfers;A little help with bathing/dressing/bathroom;Assistance with cooking/housework;Help with stairs or ramp for entrance   Can travel by private vehicle        Equipment Recommendations  None recommended by PT    Recommendations for Other Services       Precautions / Restrictions Precautions Precautions: Fall Precaution/Restrictions Comments: currently on 1.5L O2 [place] at rest (baseline Home oxygen  2 lts at rest and 3 lts with activity) Restrictions Other Position/Activity Restrictions: legally blind/HOH     Mobility  Bed Mobility Overal bed mobility: Needs Assistance             General bed mobility comments: in recliner    Transfers Overall transfer level: Needs assistance Equipment used: Rolling walker (2 wheels) Transfers: Sit to/from Stand, Bed to chair/wheelchair/BSC Sit to Stand: Contact guard assist   Step pivot transfers: Contact guard assist       General transfer comment: min cues for obstacle avidance    Ambulation/Gait Ambulation/Gait assistance: Contact guard assist Gait Distance (Feet): 110  Feet Assistive device: Rolling walker (2 wheels) Gait Pattern/deviations: Step-through pattern, Decreased stride length, Trunk flexed Gait velocity: decreased     General Gait Details: patient ambulated using a RW,  more forward flexed posture the farther he  went, urgently needed to get to [facility] when returned to room.   Stairs             Wheelchair Mobility     Tilt Bed    Modified Rankin (Stroke Patients Only)       Balance Overall balance assessment: Needs assistance Sitting-balance support: No upper extremity supported, Feet supported Sitting balance-Leahy Scale: Good     Standing balance support: Bilateral upper extremity supported, Reliant on assistive device for balance Standing balance-Leahy Scale: Poor                              Communication Communication Communication: Impaired Factors Affecting Communication: Hearing impaired  Cognition Arousal: Alert Behavior During Therapy: WFL for tasks assessed/performed, Anxious   PT - Cognitive impairments: No apparent impairments                       PT - Cognition Comments: HOH and legally blind Following commands: Intact      Cueing Cueing Techniques: Verbal cues  Exercises      General Comments        Pertinent Vitals/Pain Pain Assessment Faces Pain Scale: No hurt    Home Living  Prior Function            PT Goals (current goals can now be found in the care plan section) Progress towards PT goals: Progressing toward goals    Frequency    Min 2X/week      PT Plan      Co-evaluation              AM-PAC PT 6 Clicks Mobility   Outcome Measure  Help needed turning from your back to your side while in a flat bed without using bedrails?: A Little Help needed moving from lying on your back to sitting on the side of a flat bed without using bedrails?: A Little Help needed moving to and from a bed to a chair  (including a wheelchair)?: A Little Help needed standing up from a chair using your arms (e.g., wheelchair or bedside chair)?: A Little Help needed to walk in hospital room?: A Little Help needed climbing 3-5 steps with a railing? : Total 6 Click Score: 16    End of Session Equipment Utilized During Treatment: Gait belt;Oxygen  Activity Tolerance: Patient tolerated treatment well Patient left: in chair;with call bell/phone within reach Nurse Communication: Mobility status PT Visit Diagnosis: Other abnormalities of gait and mobility (R26.89);Muscle weakness (generalized) (M62.81)     Time: 8399-8363 PT Time Calculation (min) (ACUTE ONLY): 36 min  Charges:    $Gait Training: 23-37 mins PT General Charges $$ ACUTE PT VISIT: 1 Visit                     Darice Potters PT Acute Rehabilitation Services Office [phone]    Potters Darice Norris 11/12/2023, 4:51 PM

## 2023-11-12 NOTE — Progress Notes (Signed)
 Progress Note   Patient: Mitchell [name] FMW:[id number] DOB: [birth year] DOA: 11/05/2023     6 DOS: the patient was seen and examined on 11/12/2023  Brief hospital course: 83yo with h/o COPD on 2L home O2, afib on Eliquis , stage 3a CKD, and legal blindness who presented on 7/22 with SOB and hypoxia to 82% despite [place] O2.  He was found to have rhinovirus and metapneumovirus and treated with Levaquin .  He has been recommended for SNF rehab but is currently declining, preferring to go home.   Assessment and Plan:   COPD with acute exacerbation associated with Rhinovirus and human metapneumovirus (hMPV) Complex presentation in patient with advanced COPD and chronic respiratory failure Patient suffering from acute on chronic hypoxemic respiratory failure with O2 sats to 82% despite home O2 Metapneumovirus and rhinovirus seen on respiratory viral panel Patient also found to have bibasilar infiltrates on CT with likely superimposed bronchiectasis  Completed 5 days course of Levaquin  Obtaining SLP evaluation to ensure patient is not suffering from bouts of aspiration;MBS on 7/28, speech therapy recommends continue with oral diet, continue with precautions such as small bites, chew food well, eat slow.  Patient has been on IV Solu-Medrol  I will switch that to oral prednisone  with hopes of tapering this at discharge Continue aggressive bronchodilator therapy Continue Pulmicort  nebulized twice daily Continuing flutter valve therapy and IS   Peripheral polyneuropathy According to the son patient has been suffering from polyneuropathy and has been placed on gabapentin  several months ago for management of this Since then, son reports that his father has been extremely lethargic and does not want his home regimen of gabapentin  resumed Transitioned to low-dose Lyrica  twice daily instead on 7/24   BPH without urinary obstruction or lower urinary tract symptoms Continue Proscar    Mixed  hyperlipidemia Continue pravastatin    Paroxysmal atrial fibrillation  Rate controlled on Toprol  XL Continue outpatient regimen of Eliquis    GERD without esophagitis; dysphagia Continue proton pump inhibitor   Acute diarrhea Patient complaining of longstanding loose stools Abdomen nontender C. difficile testing negative, GI pathogen panel negative   Prediabetes Known history of prediabetes  A1c 6.3 Cover with moderate-scale SSI Carb modified diet    Abnormal CT scan, chest CT scan performed 7/24 revealed possible partial obstruction/scarring of right brachiocephalic vein This is unlikely to be clinically significant as patient is already on Eliquis  therapy for atrial fibrillation No concerning associated physical exam findings on exam of the right neck or right upper extremity   Disposition Patient is nearing medical stability for discharge He has been recommended for STR He reports prior dc with HH therapy but the agency did not show up and he thinks this is why he had to come back He is insistent about dc to home with [facility] therapy with a different agent and is refusing SNF rehab at this time If medically stable on oral steroid and other therapy then we will consider discharge home tomorrow   Class 1 obesity due to excess calories with serious comorbidity and body mass index (BMI) of 30.0 to 30.9 in adult Body mass index is 30.79 kg/m.SABRA  Weight loss should be encouraged Outpatient PCP/bariatric medicine f/u encouraged Significantly low or high BMI is associated with higher medical risk including morbidity and mortality     Family Communication: None present   Disposition: Status is: Inpatient Remains inpatient appropriate because: ongoing monitoring  Subjective:  Patient seen and examined at bedside this morning He admits to some weight gain as well as  easy fatigability. His lab results however shows contraction alkalosis. He denies abdominal pain chest pain or  cough  Physical examination General:  Appears calm and comfortable and is in NAD, very conversant Eyes:  normal lids, iris ENT:  grossly normal hearing, lips & tongue, mmm Cardiovascular:  RRR. No LE edema.  Respiratory:   R-sided rhonchi.  Normal respiratory effort.  On home 2L [place] O2 Abdomen:  soft, NT, ND Skin:  no rash or induration seen on limited exam Musculoskeletal:  grossly normal tone BUE/BLE, good ROM, no bony abnormality Psychiatric:  grossly normal mood and affect, speech fluent and appropriate, AOx3 Neurologic:  CN 2-12 grossly intact, moves all extremities in coordinated fashion Vitals:   11/12/23 0628 11/12/23 0813 11/12/23 1331 11/12/23 1633  BP: 134/75  126/76   Pulse: 71  71   Resp: 18  16   Temp: 97.8 F (36.6 C)  (!) 97.5 F (36.4 C)   TempSrc: Oral  Oral   SpO2: 94% 94% 92% 94%  Weight:      Height:        Data Reviewed:    Latest Ref Rng & Units 11/11/2023    5:30 AM 11/10/2023    5:43 AM 11/09/2023    5:55 AM  CBC  WBC 4.0 - 10.5 K/uL 12.6  11.3  9.9   Hemoglobin 13.0 - 17.0 g/dL 86.3  85.7  86.0   Hematocrit 39.0 - 52.0 % 42.9  43.5  44.3   Platelets 150 - 400 K/uL 205  166  152        Latest Ref Rng & Units 11/11/2023    5:30 AM 11/09/2023    5:55 AM 11/08/2023    5:18 AM  BMP  Glucose 70 - 99 mg/dL 788  819  815   BUN 8 - 23 mg/dL 41  31  25   Creatinine 0.61 - 1.24 mg/dL 8.76  9.05  8.98   Sodium 135 - 145 mmol/L 134  137  134   Potassium 3.5 - 5.1 mmol/L 4.9  4.5  4.6   Chloride 98 - 111 mmol/L 97  100  99   CO2 22 - 32 mmol/L 30  29  29    Calcium  8.9 - 10.3 mg/dL 8.6  8.7  8.5       Author: Drue ONEIDA Potter, MD 11/12/2023 5:18 PM  For on call review [email or web address].

## 2023-11-12 NOTE — Plan of Care (Signed)
   Problem: Clinical Measurements: Goal: Ability to maintain clinical measurements within normal limits will improve Outcome: Progressing Goal: Respiratory complications will improve Outcome: Progressing

## 2023-11-12 NOTE — Plan of Care (Signed)
   Problem: Education: Goal: Knowledge of General Education information will improve Description Including pain rating scale, medication(s)/side effects and non-pharmacologic comfort measures Outcome: Progressing   Problem: Health Behavior/Discharge Planning: Goal: Ability to manage health-related needs will improve Outcome: Progressing

## 2023-11-12 NOTE — Progress Notes (Addendum)
 Occupational Therapy Treatment Patient Details Name: Mitchell [name] MRN: [id number] DOB: 25-Nov-1940 Today's Date: 11/12/2023   History of present illness 83 y.o. male admitted on 11/05/23 with advanced COPD and chronic respiratory failure with patient suffering from acute bronchitis secondary to metapneumovirus and rhinovirus seen on respiratory viral panel.   Past medical history significant for COPD chronically on 2-3 L nasal cannula oxygen , paroxysmal atrial fibrillation on Eliquis , CKD stage III, legal blindness recently discharged from [facility] on 7/11 after stay for acute exacerbation of COPD and acute on chronic diastolic CHF   OT comments  Patient seen for skiled OT this am. Just completed toileting and requesting chair level initial activity and some intermittent standign but deferred hallway amb until later due to risk of fatige as patient reports he may leave today. Patient confirms his sons with increase support at home and he is declining rehab therefore OT rec [facility] services as well as [facility] to be initiated and discussed with TOC after session. Commode to be picked up by son as well. Addressed integration of 5 P's of energy conservation durign grooming and LE self care with min cues as well as simple standing balance and tolerance progression.  Patient requires continued Acute care hospital level OT services to progress safety and functional performance and allow for discharge.         If plan is discharge home, recommend the following:  A little help with walking and/or transfers;A little help with bathing/dressing/bathroom;Assistance with cooking/housework;Assist for transportation;Help with stairs or ramp for entrance   Equipment Recommendations  BSC/3in1       Precautions / Restrictions Precautions Precautions: Fall Recall of Precautions/Restrictions: Intact Precaution/Restrictions Comments: currently on 3L O2 [place] at rest (baseline Home oxygen  2 lts at rest and 3 lts  with activity) Restrictions Other Position/Activity Restrictions: legally blind/HOH       Mobility Bed Mobility Overal bed mobility:  (up in recliner and remained)                  Transfers Overall transfer level: Needs assistance Equipment used: Rolling walker (2 wheels) Transfers: Sit to/from Stand, Bed to chair/wheelchair/BSC Sit to Stand: Contact guard assist     Step pivot transfers: Contact guard assist     General transfer comment: min cues for obstacle navi     Balance Overall balance assessment: Mild deficits observed, not formally tested                                         ADL either performed or assessed with clinical judgement   ADL Overall ADL's : Needs assistance/impaired     Grooming: Wash/dry hands;Wash/dry face;Oral care;Supervision/safety;Standing       Lower Body Bathing: Set up;Sit to/from stand Lower Body Bathing Details (indicate cue type and reason): applying lotion to lower legs     Lower Body Dressing: Supervision/safety;Sit to/from stand Lower Body Dressing Details (indicate cue type and reason): underwear and shorts donning this session as patient reports being d/cd             Functional mobility during ADLs: Supervision/safety;Rolling walker (2 wheels) General ADL Comments: progression of standing level ADL's and cues for 5 Ps and pacing    Extremity/Trunk Assessment Upper Extremity Assessment Upper Extremity Assessment: Generalized weakness;Right hand dominant   Lower Extremity Assessment Lower Extremity Assessment: Defer to PT evaluation  Vision   Vision Assessment?: Yes (no change from basline legal blindness) Additional Comments: requires services for the blind upon d/c         Communication Communication Communication: Impaired Factors Affecting Communication: Hearing impaired   Cognition Arousal: Alert Behavior During Therapy: WFL for tasks assessed/performed Cognition:  Cognition impaired             OT - Cognition Comments: requires cues for recalling pacing, higher level sequencing as patient can be impulsive and risk fall                 Following commands: Intact        Cueing   Cueing Techniques: Verbal cues        General Comments SpO2 98% on 2 ltrs O2 via [place]    Pertinent Vitals/ Pain       Pain Assessment Pain Assessment: No/denies pain   Frequency  Min 2X/week        Progress Toward Goals  OT Goals(current goals can now be found in the care plan section)  Progress towards OT goals: Progressing toward goals  Acute Rehab OT Goals Patient Stated Goal: to get his services in the home OT Goal Formulation: With patient Time For Goal Achievement: 11/23/23 Potential to Achieve Goals: Fair ADL Goals Pt Will Perform Lower Body Bathing: with adaptive equipment;with set-up;sit to/from stand Pt Will Perform Lower Body Dressing: with supervision;with adaptive equipment;sit to/from stand Pt Will Transfer to Toilet: with modified independence;regular height toilet;grab bars Pt Will Perform Toileting - Clothing Manipulation and hygiene: with adaptive equipment;with set-up;sit to/from stand Pt/caregiver will Perform Home Exercise Program: Increased strength;Independently;With written HEP provided;Both right and left upper extremity Additional ADL Goal #1: Patient will teach back 4/4 ECT's during ADL's and mobility with min cues  Plan         AM-PAC OT 6 Clicks Daily Activity     Outcome Measure   Help from another person eating meals?: None Help from another person taking care of personal grooming?: None Help from another person toileting, which includes using toliet, bedpan, or urinal?: A Little Help from another person bathing (including washing, rinsing, drying)?: A Little Help from another person to put on and taking off regular upper body clothing?: A Little Help from another person to put on and taking off regular  lower body clothing?: A Little 6 Click Score: 20    End of Session Equipment Utilized During Treatment: Gait belt;Rolling walker (2 wheels);Oxygen   OT Visit Diagnosis: Unsteadiness on feet (R26.81);Muscle weakness (generalized) (M62.81);Low vision, both eyes (H54.2);Pain   Activity Tolerance Patient tolerated treatment well   Patient Left with call bell/phone within reach;in chair;with chair alarm set   Nurse Communication Mobility status        Time: 8877-8855 OT Time Calculation (min): 22 min  Charges: OT Treatments $Therapeutic Activity: 8-22 mins Braulio Kiedrowski OT/L Acute Rehabilitation Department  [phone] 11/12/2023, 12:48 PM

## 2023-11-12 NOTE — TOC Transition Note (Signed)
 Transition of Care [facility]) - Discharge Note   Patient Details  Name: Mitchell [name] MRN: [id number] Date of Birth: 20-Jun-1940  Transition [facility]) CM/SW Contact:  Toy LITTIE Agar, RN Phone Number:[phone]  11/12/2023, 12:11 PM   Clinical Narrative:    Cm has confirmed that patient is active with [facility]  PT/OT RN services. Services will resume at discharge.      Barriers to Discharge: Continued Medical Work up   Patient Goals and CMS Choice            Discharge Placement                       Discharge Plan and Services Additional resources added to the After Visit Summary for                                       Social Drivers of Health (SDOH) Interventions SDOH Screenings   Food Insecurity: Food Insecurity Present (11/05/2023)  Housing: Low Risk  (11/05/2023)  Transportation Needs: Unmet Transportation Needs (11/05/2023)  Utilities: At Risk (11/05/2023)  Alcohol  Screen: Low Risk  (08/24/2021)  Depression (PHQ2-9): Low Risk  (02/07/2019)  Social Connections: Socially Isolated (11/05/2023)  Tobacco Use: Medium Risk (11/05/2023)     Readmission Risk Interventions    09/17/2023   12:02 PM  Readmission Risk Prevention Plan  Transportation Screening Complete  PCP or Specialist Appt within 3-5 Days Complete  HRI or Home Care Consult Complete  Social Work Consult for Recovery Care Planning/Counseling Complete  Palliative Care Screening Not Applicable  Medication Review Oceanographer) Complete

## 2023-11-13 ENCOUNTER — Other Ambulatory Visit: Payer: Self-pay

## 2023-11-13 DIAGNOSIS — J441 Chronic obstructive pulmonary disease with (acute) exacerbation: Secondary | ICD-10-CM | POA: Diagnosis not present

## 2023-11-13 LAB — BASIC METABOLIC PANEL WITH GFR
Anion gap: 10 (ref 5–15)
BUN: 38 mg/dL — ABNORMAL HIGH (ref 8–23)
CO2: 30 mmol/L (ref 22–32)
Calcium: 8.8 mg/dL — ABNORMAL LOW (ref 8.9–10.3)
Chloride: 95 mmol/L — ABNORMAL LOW (ref 98–111)
Creatinine, Ser: 1.24 mg/dL (ref 0.61–1.24)
GFR, Estimated: 57 mL/min — ABNORMAL LOW (ref >60)
Glucose, Bld: 117 mg/dL — ABNORMAL HIGH (ref 70–99)
Potassium: 4.7 mmol/L (ref 3.5–5.1)
Sodium: 135 mmol/L (ref 135–145)

## 2023-11-13 LAB — CBC WITH DIFFERENTIAL/PLATELET
Abs Granulocyte: 12.2 K/uL — ABNORMAL HIGH (ref 1.5–6.5)
Abs Immature Granulocytes: 2.45 K/uL — ABNORMAL HIGH (ref 0.00–0.07)
Basophils Absolute: 0 K/uL (ref 0.0–0.1)
Basophils Relative: 0 %
Eosinophils Absolute: 0 K/uL (ref 0.0–0.5)
Eosinophils Relative: 0 %
HCT: 52.1 % — ABNORMAL HIGH (ref 39.0–52.0)
Hemoglobin: 16.7 g/dL (ref 13.0–17.0)
Immature Granulocytes: 14 %
Lymphocytes Relative: 7 %
Lymphs Abs: 1.2 K/uL (ref 0.7–4.0)
MCH: 28.9 pg (ref 26.0–34.0)
MCHC: 32.1 g/dL (ref 30.0–36.0)
MCV: 90.1 fL (ref 80.0–100.0)
Monocytes Absolute: 1.5 K/uL — ABNORMAL HIGH (ref 0.1–1.0)
Monocytes Relative: 8 %
Neutro Abs: 12.2 K/uL — ABNORMAL HIGH (ref 1.7–7.7)
Neutrophils Relative %: 71 %
Platelets: 302 K/uL (ref 150–400)
RBC: 5.78 MIL/uL (ref 4.22–5.81)
RDW: 15.8 % — ABNORMAL HIGH (ref 11.5–15.5)
Smear Review: NORMAL
WBC: 17.4 K/uL — ABNORMAL HIGH (ref 4.0–10.5)
nRBC: 0.3 % — ABNORMAL HIGH (ref 0.0–0.2)

## 2023-11-13 LAB — GLUCOSE, CAPILLARY
Glucose-Capillary: 128 mg/dL — ABNORMAL HIGH (ref 70–99)
Glucose-Capillary: 125 mg/dL — ABNORMAL HIGH (ref 70–99)
Glucose-Capillary: 227 mg/dL — ABNORMAL HIGH (ref 70–99)

## 2023-11-13 MED ORDER — IPRATROPIUM-ALBUTEROL 0.5-2.5 (3) MG/3ML IN SOLN
3.0000 mL | Freq: Three times a day (TID) | RESPIRATORY_TRACT | Status: DC
  Administered 2023-11-13 – 2023-11-14 (×3): 3 mL via RESPIRATORY_TRACT
  Filled 2023-11-13 (×3): qty 3

## 2023-11-13 MED ORDER — FUROSEMIDE 40 MG PO TABS
40.0000 mg | ORAL_TABLET | Freq: Every day | ORAL | Status: DC
  Administered 2023-11-13: 40 mg via ORAL
  Filled 2023-11-13 (×2): qty 1

## 2023-11-13 NOTE — Plan of Care (Signed)
 ?  Problem: Clinical Measurements: ?Goal: Ability to maintain clinical measurements within normal limits will improve ?Outcome: Progressing ?Goal: Will remain free from infection ?Outcome: Progressing ?Goal: Diagnostic test results will improve ?Outcome: Progressing ?  ?

## 2023-11-13 NOTE — Progress Notes (Signed)
 PROGRESS NOTE    Mitchell [name]  FMW:[id number] DOB: 08-16-1940 DOA: 11/05/2023 PCP: Okey Carlin Redbird, MD    Brief Narrative:   Mitchell [name] is a 83 y.o. male with past medical history significant for chronic respiratory failure/COPD on 2 L  at baseline, paroxysmal atrial fibrillation on anticoagulation, chronic diastolic congestive heart failure, CKD stage IIIa, legal blindness, BPH, mixed hyperlipidemia who presented to [facility] ED on 11/05/2023 via EMS from home with shortness of breath.  On EMS arrival patient's oxygen  saturation 82%, treated with 3 DuoNebs, magnesium , Solu-Medrol  and epinephrine by EMS.  Patient reports gradual worsening of his shortness of breath during the last week, unable to sleep at night with constant cough.  Treated with DuoNebs without effect.  EMS transported to the ED for further evaluation and management  Recently admitted [name] 6/28 through 7/11 for acute on chronic respiratory failure secondary to COPD exacerbation and diastolic CHF exacerbation.  In the ED, temperature 98.5 F, HR 106, RR 24, BP 136/85, SpO2 100% on aerosol mask.  WBC 8.6, hemoglobin 15.1, platelet count 111.  Sodium 140, potassium 3.6, chloride 97, CO2 33, glucose 136, BUN 16, creatinine 0.91, AST 29, ALT 49, total bilirubin 1.0.  BNP 32.3.  High-sensitivity troponin 7.  Lactic acid 1.4.  INR 1.1.  COVID/RSV/influenza PCR negative.  Respiratory viral panel positive for metapneumovirus and rhinovirus/enterovirus.  Chest x-ray with no acute findings.  Patient given Mucinex , DuoNebs, Solu-Medrol  in the ED.  TRH consulted for admission for further evaluation and management of acute COPD exacerbation.  Assessment & Plan:   COPD exacerbation, acute Chronic hypoxic respiratory failure, stable Rhinovirus/human metapneumovirus infection Bacterial bronchiectasis Patient presenting with progressive shortness of breath over the past week prior to admission.  Recently admitted for COPD/CHF exacerbation.   Patient was noted to be hypoxic with SpO2 82% on 2 L nasal cannula which is his baseline at home on EMS check.  Patient was afebrile without leukocytosis.  COVID/RSV/influenza PCR negative.  Respiratory viral panel positive for metapneumovirus and rhinovirus, likely a significant contributing factor.  CT chest with likely superimposed bronchiectasis.  SLP evaluation to ensure patient not suffering from aspiration, MBS on 7/28 with recommendations of oral diet, precautions with small bites, chew food well and eat slow.  Completed 5-day course of Levaquin . -- Pulmicort  neb twice daily (On [facility]  outpatient) -- DuoNeb 3 times daily  -- Prednisone  -- Flutter valve, incentive spirometry -- Continue oxygen , maintain SpO2 greater than 88%; at baseline on 2 L nasal cannula  Leukocytosis -- WBC 8.6>>12.6>17.4 -- Etiology likely secondary to white cell de-marginalization from steroid use -- Repeat CBC in a.m., check procalcitonin  Diarrhea, chronic Patient with reports of longstanding loose stools.  C. difficile and GI PCR panel negative.  Abnormal CT scan chest CT chest 7/24 with possible partial obstruction/scarring right brachiocephalic vein.  Unlikely to be clinically significant as patient already on Eliquis  for atrial fibrillation, no concerning associated physical exam findingsto neck or right upper extremity.  Chronic diastolic congestive heart failure, compensated BNP 32.3 on admission.  TTE 10/13/2023 with LVEF 55 to 60%. -- Lasix  40 mg p.o. daily -- Strict I's and O's and daily weights  Paroxysmal atrial fibrillation -- Metoprolol  tartrate 25 m p.o. twice daily -- Apixaban  5 g p.o. twice daily  Mixed hyperlipidemia -- Pravastatin  20 mg p.o. nightly  CKD stage IIIa -- Cr 0.91>>1.12>>1.23>1.24; stable  Peripheral polyneuropathy According to patient's son, patient suffering from polyneuropathy and placed on gabapentin  several months ago for management.  Since, son reports father has  been lethargic and does not want his home regimen of gabapentin  resumed. -- Gabapentin  discontinued -- Started on Lyrica  50 mg p.o. twice daily  BPH -- Finasteride  5 mg p.o. every morning  GERD -- Protonix  40 mg p.o. daily  Legal blindness -- Supportive care  Obesity, class I Body mass index is 30.61 kg/m.  Weakness/debility/deconditioning: PT/OT recommend SNF placement, patient declines. --TOC following for home health on discharge   DVT prophylaxis: apixaban  (ELIQUIS ) tablet 5 mg Start: 11/05/23 1400 apixaban  (ELIQUIS ) tablet 5 mg    Code Status: Limited: Do not attempt resuscitation (DNR) -DNR-LIMITED -Do Not Intubate/DNI  Family Communication:   Disposition Plan:  Level of care: Med-Surg Status is: Inpatient Remains inpatient appropriate because: Dissipate discharge home in 1-2 days, refused SNF placement    Consultants:  none  Procedures:  none  Antimicrobials:  Levaquin  7/24 - 7/28   Subjective: Patient seen examined bedside, sitting in bedside chair.  Complaining of not having his weight checked today.  Also reports that we are missing his diagnosis.  Reports that he should be around 217 pounds.  Discussed with patient that his BNP was within normal limits on admission and that his symptoms are likely related to his underlying COPD superimposed on his rhino and metapneumovirus infection.  Discussed will have staff weigh patient today, we will reassess his volume status with repeat BMP in the a.m.  Remains on 2 L nasal cannula with SpO2 95% which is adequate and is at baseline.  Completed 5-day course of Levaquin .  No other specific questions or concerns at this time.  No family present at bedside.  Denies headache, no chest pain, no palpitations, no abdominal pain, no fever/chills/night sweats, no nausea/vomiting/diarrhea, no focal weakness, no fatigue, no paresthesias.  No acute events overnight per nurse staff.  Objective: Vitals:   11/13/23 0625 11/13/23  0801 11/13/23 1100 11/13/23 1250  BP: (!) 151/94   (!) 143/85  Pulse: 72   70  Resp: 14   16  Temp: 97.9 F (36.6 C)   98.1 F (36.7 C)  TempSrc: Oral   Oral  SpO2: 98% 98%  95%  Weight:   102.4 kg   Height:        Intake/Output Summary (Last 24 hours) at 11/13/2023 1544 Last data filed at 11/13/2023 1250 Gross per 24 hour  Intake 720 ml  Output 300 ml  Net 420 ml   Filed Weights   11/05/23 1102 11/13/23 1100  Weight: 103 kg 102.4 kg    Examination:  Physical Exam: GEN: NAD, alert and oriented x 3, obese, chronically ill appearance, appears older than stated age HEENT: NCAT, PERRL, EOMI, sclera clear, MMM PULM: Breath sounds diminished lateral bases, no wheezes/crackles, normal respiratory effort without accessory muscle use, on 2 L [place] which is at baseline CV: RRR w/o M/G/R GI: abd soft, NTND, +BS MSK: no peripheral edema, moves all extremity independently NEURO: CN II-XII intact, no focal deficits, sensation to light touch intact PSYCH: normal mood/affect Integumentary: Chronic venous changes bilateral lower extremities, no concerning rashes/lesions/wounds noted on exposed skin surfaces     Data Reviewed: I have personally reviewed following labs and imaging studies  CBC: Recent Labs  Lab 11/08/23 0518 11/09/23 0555 11/10/23 0543 11/11/23 0530 11/13/23 0627  WBC 10.5 9.9 11.3* 12.6* 17.4*  NEUTROABS 9.5* 8.9* 9.6* 10.6* 12.2*  HGB 13.2 13.9 14.2 13.6 16.7  HCT 42.0 44.3 43.5 42.9 52.1*  MCV 89.2 89.5 89.1 88.6 90.1  PLT 128* 152 166 205 302   Basic Metabolic Panel: Recent Labs  Lab 11/07/23 0624 11/08/23 0518 11/09/23 0555 11/11/23 0530 11/13/23 0627  NA 135 134* 137 134* 135  K 4.1 4.6 4.5 4.9 4.7  CL 98 99 100 97* 95*  CO2 27 29 29 30 30   GLUCOSE 183* 184* 180* 211* 117*  BUN 24* 25* 31* 41* 38*  CREATININE 1.12 1.01 0.94 1.23 1.24  CALCIUM  8.7* 8.5* 8.7* 8.6* 8.8*  MG 2.3 2.3 2.5*  --   --   PHOS  --  3.2  --   --   --    GFR: Estimated  Creatinine Clearance: 54.9 mL/min (by C-G formula based on SCr of 1.24 mg/dL). Liver Function Tests: Recent Labs  Lab 11/07/23 0624 11/08/23 0518 11/09/23 0555 11/11/23 0530  AST 18 15 15 20   ALT 34 30 29 36  ALKPHOS 56 52 52 49  BILITOT 0.6 0.6 0.4 0.7  PROT 5.4* 5.2* 5.2* 4.6*  ALBUMIN 2.6* 2.4* 2.5* 2.2*   No results for input(s): LIPASE, AMYLASE in the last 168 hours. No results for input(s): AMMONIA in the last 168 hours. Coagulation Profile: No results for input(s): INR, PROTIME in the last 168 hours. Cardiac Enzymes: No results for input(s): CKTOTAL, CKMB, CKMBINDEX, TROPONINI in the last 168 hours. BNP (last 3 results) Recent Labs    01/15/23 0949  PROBNP 288   HbA1C: No results for input(s): HGBA1C in the last 72 hours. CBG: Recent Labs  Lab 11/12/23 1116 11/12/23 1627 11/12/23 2138 11/13/23 0755 11/13/23 1125  GLUCAP 193* 255* 226* 128* 125*   Lipid Profile: No results for input(s): CHOL, HDL, LDLCALC, TRIG, CHOLHDL, LDLDIRECT in the last 72 hours. Thyroid Function Tests: No results for input(s): TSH, T4TOTAL, FREET4, T3FREE, THYROIDAB in the last 72 hours. Anemia Panel: No results for input(s): VITAMINB12, FOLATE, FERRITIN, TIBC, IRON, RETICCTPCT in the last 72 hours. Sepsis Labs: Recent Labs  Lab 11/07/23 1659  PROCALCITON <0.10    Recent Results (from the past 240 hours)  C Difficile Quick Screen w PCR reflex     Status: None   Collection Time: 11/05/23 10:47 AM   Specimen: STOOL  Result Value Ref Range Status   C Diff antigen NEGATIVE NEGATIVE Final   C Diff toxin NEGATIVE NEGATIVE Final   C Diff interpretation No C. difficile detected.  Final    Comment: Performed at [facility], [address], [place], [place] [address]  Blood Culture (routine x 2)     Status: None   Collection Time: 11/05/23 11:03 AM   Specimen: BLOOD  Result Value Ref Range Status   Specimen  Description   Final    BLOOD RIGHT ANTECUBITAL Performed at [facility], [address], [place], [place] [address]    Special Requests   Final    BOTTLES DRAWN AEROBIC AND ANAEROBIC Blood Culture adequate volume Performed at [facility], [address] [place], [place] [address]    Culture   Final    NO GROWTH 5 DAYS Performed at [facility], [address] [place], [place] [address]    Report Status 11/10/2023 FINAL  Final  Gastrointestinal Panel by PCR , Stool     Status: None   Collection Time: 11/05/23 11:04 AM   Specimen: STOOL  Result Value Ref Range Status   Campylobacter species NOT DETECTED NOT DETECTED Final   Plesimonas shigelloides NOT DETECTED NOT DETECTED Final  Salmonella species NOT DETECTED NOT DETECTED Final   Yersinia enterocolitica NOT DETECTED NOT DETECTED Final   Vibrio species NOT DETECTED NOT DETECTED Final   Vibrio cholerae NOT DETECTED NOT DETECTED Final   Enteroaggregative E coli (EAEC) NOT DETECTED NOT DETECTED Final   Enteropathogenic E coli (EPEC) NOT DETECTED NOT DETECTED Final   Enterotoxigenic E coli (ETEC) NOT DETECTED NOT DETECTED Final   Shiga like toxin producing E coli (STEC) NOT DETECTED NOT DETECTED Final   Shigella/Enteroinvasive E coli (EIEC) NOT DETECTED NOT DETECTED Final   Cryptosporidium NOT DETECTED NOT DETECTED Final   Cyclospora cayetanensis NOT DETECTED NOT DETECTED Final   Entamoeba histolytica NOT DETECTED NOT DETECTED Final   Giardia lamblia NOT DETECTED NOT DETECTED Final   Adenovirus F40/41 NOT DETECTED NOT DETECTED Final   Astrovirus NOT DETECTED NOT DETECTED Final   Norovirus GI/GII NOT DETECTED NOT DETECTED Final   Rotavirus A NOT DETECTED NOT DETECTED Final   Sapovirus (I, II, IV, and V) NOT DETECTED NOT DETECTED Final    Comment: Performed at [facility], [address] [place], [place] [address]  Blood Culture (routine x 2)     Status: None   Collection  Time: 11/05/23 11:30 AM   Specimen: BLOOD RIGHT HAND  Result Value Ref Range Status   Specimen Description   Final    BLOOD RIGHT HAND Performed at [facility], [address] [place], [place] [address]    Special Requests   Final    BOTTLES DRAWN AEROBIC ONLY Blood Culture results may not be optimal due to an inadequate volume of blood received in culture bottles Performed at [facility], [address], [place], [place] [address]    Culture   Final    NO GROWTH 5 DAYS Performed at [facility], [address] [place], [place] [address]    Report Status 11/10/2023 FINAL  Final  Resp panel by RT-PCR (RSV, Flu A&B, Covid) Anterior Nasal Swab     Status: None   Collection Time: 11/05/23 11:30 AM   Specimen: Anterior Nasal Swab  Result Value Ref Range Status   SARS Coronavirus 2 by RT PCR NEGATIVE NEGATIVE Final    Comment: (NOTE) SARS-CoV-2 target nucleic acids are NOT DETECTED.  The SARS-CoV-2 RNA is generally detectable in upper respiratory specimens during the acute phase of infection. The lowest concentration of SARS-CoV-2 viral copies this assay can detect is 138 copies/mL. A negative result does not preclude SARS-Cov-2 infection and should not be used as the sole basis for treatment or other patient management decisions. A negative result may occur with  improper specimen collection/handling, submission of specimen other than nasopharyngeal swab, presence of viral mutation(s) within the areas targeted by this assay, and inadequate number of viral copies(<138 copies/mL). A negative result must be combined with clinical observations, patient history, and epidemiological information. The expected result is Negative.  Fact Sheet for Patients:  BloggerCourse.com  Fact Sheet for Healthcare Providers:  SeriousBroker.it  This test is no t yet approved or cleared by the [place]  FDA and   has been authorized for detection and/or diagnosis of SARS-CoV-2 by FDA under an Emergency Use Authorization (EUA). This EUA will remain  in effect (meaning this test can be used) for the duration of the COVID-19 declaration under Section 564(b)(1) of the Act, 21 U.S.C.section 360bbb-3(b)(1), unless the authorization is terminated  or revoked sooner.       Influenza A by PCR NEGATIVE NEGATIVE  Final   Influenza B by PCR NEGATIVE NEGATIVE Final    Comment: (NOTE) The Xpert Xpress SARS-CoV-2/FLU/RSV plus assay is intended as an aid in the diagnosis of influenza from Nasopharyngeal swab specimens and should not be used as a sole basis for treatment. Nasal washings and aspirates are unacceptable for Xpert Xpress SARS-CoV-2/FLU/RSV testing.  Fact Sheet for Patients: BloggerCourse.com  Fact Sheet for Healthcare Providers: SeriousBroker.it  This test is not yet approved or cleared by the [place]  FDA and has been authorized for detection and/or diagnosis of SARS-CoV-2 by FDA under an Emergency Use Authorization (EUA). This EUA will remain in effect (meaning this test can be used) for the duration of the COVID-19 declaration under Section 564(b)(1) of the Act, 21 U.S.C. section 360bbb-3(b)(1), unless the authorization is terminated or revoked.     Resp Syncytial Virus by PCR NEGATIVE NEGATIVE Final    Comment: (NOTE) Fact Sheet for Patients: BloggerCourse.com  Fact Sheet for Healthcare Providers: SeriousBroker.it  This test is not yet approved or cleared by the [place]  FDA and has been authorized for detection and/or diagnosis of SARS-CoV-2 by FDA under an Emergency Use Authorization (EUA). This EUA will remain in effect (meaning this test can be used) for the duration of the COVID-19 declaration under Section 564(b)(1) of the Act, 21 U.S.C. section 360bbb-3(b)(1),  unless the authorization is terminated or revoked.  Performed at [facility], [address] [place], [place] [address]   Respiratory (~20 pathogens) panel by PCR     Status: Abnormal   Collection Time: 11/06/23  6:53 PM   Specimen: Nasopharyngeal Swab; Respiratory  Result Value Ref Range Status   Adenovirus NOT DETECTED NOT DETECTED Final   Coronavirus 229E NOT DETECTED NOT DETECTED Final    Comment: (NOTE) The Coronavirus on the Respiratory Panel, DOES NOT test for the novel  Coronavirus (2019 nCoV)    Coronavirus HKU1 NOT DETECTED NOT DETECTED Final   Coronavirus NL63 NOT DETECTED NOT DETECTED Final   Coronavirus OC43 NOT DETECTED NOT DETECTED Final   Metapneumovirus DETECTED (A) NOT DETECTED Final   Rhinovirus / Enterovirus DETECTED (A) NOT DETECTED Final   Influenza A NOT DETECTED NOT DETECTED Final   Influenza B NOT DETECTED NOT DETECTED Final   Parainfluenza Virus 1 NOT DETECTED NOT DETECTED Final   Parainfluenza Virus 2 NOT DETECTED NOT DETECTED Final   Parainfluenza Virus 3 NOT DETECTED NOT DETECTED Final   Parainfluenza Virus 4 NOT DETECTED NOT DETECTED Final   Respiratory Syncytial Virus NOT DETECTED NOT DETECTED Final   Bordetella pertussis NOT DETECTED NOT DETECTED Final   Bordetella Parapertussis NOT DETECTED NOT DETECTED Final   Chlamydophila pneumoniae NOT DETECTED NOT DETECTED Final   Mycoplasma pneumoniae NOT DETECTED NOT DETECTED Final    Comment: Performed at [facility], [address], [place], [place] [address]         Radiology Studies: No results found.      Scheduled Meds:  apixaban   5 mg Oral BID   budesonide  (PULMICORT ) nebulizer solution  0.25 mg Nebulization BID   feeding supplement  1 Bottle Oral BID BM   finasteride   5 mg Oral q AM   guaiFENesin   600 mg Oral BID   insulin  aspart  0-9 Units Subcutaneous TID WC   ipratropium-albuterol   3 mL Nebulization TID   metoprolol  tartrate  25 mg Oral BID    pantoprazole   40 mg Oral Q0600   pravastatin   20 mg Oral QPM  predniSONE   40 mg Oral Q breakfast   pregabalin   50 mg Oral BID   QUEtiapine   50 mg Oral QHS   Continuous Infusions:   LOS: 7 days    Time spent: 51 minutes spent on 11/13/2023 caring for this patient face-to-face including chart review, ordering labs/tests, documenting, discussion with nursing staff, consultants, updating family and interview/physical exam    Camellia PARAS [place], DO [facility] Available via Epic secure chat 7am-7pm After these hours, please refer to coverage provider listed on [email or web address] 11/13/2023, 3:44 PM

## 2023-11-14 DIAGNOSIS — J441 Chronic obstructive pulmonary disease with (acute) exacerbation: Secondary | ICD-10-CM | POA: Diagnosis not present

## 2023-11-14 LAB — BASIC METABOLIC PANEL WITH GFR
Anion gap: 13 (ref 5–15)
BUN: 40 mg/dL — ABNORMAL HIGH (ref 8–23)
CO2: 30 mmol/L (ref 22–32)
Calcium: 8.4 mg/dL — ABNORMAL LOW (ref 8.9–10.3)
Chloride: 94 mmol/L — ABNORMAL LOW (ref 98–111)
Creatinine, Ser: 1.28 mg/dL — ABNORMAL HIGH (ref 0.61–1.24)
GFR, Estimated: 55 mL/min — ABNORMAL LOW (ref >60)
Glucose, Bld: 101 mg/dL — ABNORMAL HIGH (ref 70–99)
Potassium: 4.2 mmol/L (ref 3.5–5.1)
Sodium: 137 mmol/L (ref 135–145)

## 2023-11-14 LAB — GLUCOSE, CAPILLARY
Glucose-Capillary: 132 mg/dL — ABNORMAL HIGH (ref 70–99)
Glucose-Capillary: 98 mg/dL (ref 70–99)
Glucose-Capillary: 111 mg/dL — ABNORMAL HIGH (ref 70–99)

## 2023-11-14 LAB — BRAIN NATRIURETIC PEPTIDE: B Natriuretic Peptide: 94 pg/mL (ref 0.0–100.0)

## 2023-11-14 LAB — PROCALCITONIN: Procalcitonin: <0.1 ng/mL

## 2023-11-14 MED ORDER — QUETIAPINE FUMARATE 25 MG PO TABS: 25.0000 mg | ORAL_TABLET | Freq: Every day | ORAL | 0 refills | Status: AC

## 2023-11-14 MED ORDER — PREDNISONE 10 MG PO TABS: ORAL_TABLET | ORAL | 0 refills | Status: AC

## 2023-11-14 MED ORDER — BUDESON-GLYCOPYRROL-FORMOTEROL 160-9-4.8 MCG/ACT IN AERO: 2.0000 | INHALATION_SPRAY | Freq: Two times a day (BID) | RESPIRATORY_TRACT | 3 refills | Status: AC

## 2023-11-14 MED ORDER — PREGABALIN 50 MG PO CAPS: 50.0000 mg | ORAL_CAPSULE | Freq: Two times a day (BID) | ORAL | 0 refills | Status: AC

## 2023-11-14 MED ORDER — IPRATROPIUM-ALBUTEROL 0.5-2.5 (3) MG/3ML IN SOLN: 3.0000 mL | RESPIRATORY_TRACT | 0 refills | Status: AC | PRN

## 2023-11-14 NOTE — Progress Notes (Addendum)
 Pt refused lasix . Pt upset about not getting much rest and unable to sleep lastnight. Pt stated he doe not want to take lasix  prior to discharging home because he will have to urinate multiple times. This nurse educated pt on the importance of taking lasix  and pt verbalized understanding and still refused lasix .

## 2023-11-14 NOTE — Progress Notes (Signed)
 Discharge education provided to pt. Pt verbalized having a machine for nebulizer treatment and personal oxygen  tank.   Informed pt of changes to medication and to pick up medications from pharmacy after discharge.  Pt didn't have any questions or concerns

## 2023-11-14 NOTE — Discharge Summary (Signed)
 Physician Discharge Summary  AVEL OGAWA FMW:[id number] DOB: June 25, 1940 DOA: 11/05/2023  PCP: Okey Carlin Redbird, MD  Admit date: 11/05/2023 Discharge date: 11/14/2023  Admitted From: Home Disposition: Home with home health (refused SNF placement)  Recommendations for Outpatient Follow-up:  Follow up with PCP in 1-2 weeks Follow-up with pulmonology, Dr. Kara 2 weeks Follow-up with cardiology, Dr. Michele as needed Gabapentin  discontinued in favor of Lyrica  50 mg p.o. twice daily Continue prednisone  taper on discharge Refilled home Breztri  inhaler Recommend continue to monitor daily weights  Home Health: PT/OT/RN Equipment/Devices: None  Discharge Condition: Stable CODE STATUS: DNR Diet recommendation: Heart healthy diet  History of present illness:  Mitchell [name] is a 83 y.o. male with past medical history significant for chronic respiratory failure/COPD on 2 L [place] at baseline, paroxysmal atrial fibrillation on anticoagulation, chronic diastolic congestive heart failure, CKD stage IIIa, legal blindness, BPH, mixed hyperlipidemia who presented to [facility] ED on 11/05/2023 via EMS from home with shortness of breath.  On EMS arrival patient's oxygen  saturation 82%, treated with 3 DuoNebs, magnesium , Solu-Medrol  and epinephrine by EMS.  Patient reports gradual worsening of his shortness of breath during the last week, unable to sleep at night with constant cough.  Treated with DuoNebs without effect.  EMS transported to the ED for further evaluation and management   Recently admitted [facility] 6/28 through 7/11 for acute on chronic respiratory failure secondary to COPD exacerbation and diastolic CHF exacerbation.   In the ED, temperature 98.5 F, HR 106, RR 24, BP 136/85, SpO2 100% on aerosol mask.  WBC 8.6, hemoglobin 15.1, platelet count 111.  Sodium 140, potassium 3.6, chloride 97, CO2 33, glucose 136, BUN 16, creatinine 0.91, AST 29, ALT 49, total bilirubin 1.0.  BNP 32.3.  High-sensitivity  troponin 7.  Lactic acid 1.4.  INR 1.1.  COVID/RSV/influenza PCR negative.  Respiratory viral panel positive for metapneumovirus and rhinovirus/enterovirus.  Chest x-ray with no acute findings.  Patient given Mucinex , DuoNebs, Solu-Medrol  in the ED.  TRH consulted for admission for further evaluation and management of acute COPD exacerbation.  Hospital course:  COPD exacerbation, acute Chronic hypoxic respiratory failure, stable Rhinovirus/human metapneumovirus infection Bacterial bronchiectasis Patient presenting with progressive shortness of breath over the past week prior to admission.  Recently admitted for COPD/CHF exacerbation.  Patient was noted to be hypoxic with SpO2 82% on 2 L nasal cannula which is his baseline at home on EMS check.  Patient was afebrile without leukocytosis.  COVID/RSV/influenza PCR negative.  Respiratory viral panel positive for metapneumovirus and rhinovirus, likely a significant contributing factor.  CT chest with likely superimposed bronchiectasis.  SLP evaluation to ensure patient not suffering from aspiration, MBS on 7/28 with recommendations of oral diet, precautions with small bites, chew food well and eat slow.  Completed 5-day course of Levaquin .  Refilled home of Breztri  and DuoNebs.  Patient has been maintained on his baseline 2 L nasal cannula with adequate oxygenation.  Prednisone  taper at discharge.  Outpatient follow-up with pulmonology, Dr. Kara.   Leukocytosis Etiology likely secondary to white cell de-marginalization from steroid use.  Recommend repeat CBC 2 weeks following prednisone  taper.   Diarrhea, chronic Patient with reports of longstanding loose stools.  C. difficile and GI PCR panel negative.   Abnormal CT scan chest CT chest 7/24 with possible partial obstruction/scarring right brachiocephalic vein.  Unlikely to be clinically significant as patient already on Eliquis  for atrial fibrillation, no concerning associated physical exam findingsto  neck or right upper extremity.  Chronic diastolic congestive heart failure, compensated BNP 32.3 on admission.  TTE 10/13/2023 with LVEF 55 to 60%.  Continue home Lasix .  Recommend monitoring of daily weights.  Outpatient follow-up with cardiology, Dr. Tolia PRN.   Paroxysmal atrial fibrillation Metoprolol  tartrate 25 m p.o. twice daily, Apixaban  5 mg p.o. twice daily   Mixed hyperlipidemia Pravastatin  20 mg p.o. nightly   CKD stage IIIa Creatinine 1.28  at time of discharge;  Stable   Peripheral polyneuropathy According to patient's son, patient suffering from polyneuropathy and placed on gabapentin  several months ago for management.  Since, son reports father has been lethargic and does not want his home regimen of gabapentin  resumed.  Gabapentin  discontinued and started on Lyrica  50 mg p.o. twice daily.  Outpatient follow-up with PCP.   BPH Finasteride  5 mg p.o. every morning   Legal blindness Supportive care   Obesity, class I Body mass index is 30.61 kg/m.   Weakness/debility/deconditioning: PT/OT recommend SNF placement, patient declines. --TOC following for home health on discharge  Discharge Diagnoses:  Principal Problem:   COPD with acute exacerbation (HCC) Active Problems:   Mixed hyperlipidemia   Class 1 obesity due to excess calories with serious comorbidity and body mass index (BMI) of 30.0 to 30.9 in adult   BPH without urinary obstruction or lower urinary tract symptoms   Acute on chronic respiratory failure with hypoxia (HCC)   Paroxysmal atrial fibrillation (HCC)   GERD without esophagitis   Acute diarrhea   Acute bronchitis with bronchiectasis (HCC)   Acute bronchitis due to human metapneumovirus (hMPV)   Acute bronchitis due to Rhinovirus   Peripheral polyneuropathy   Prediabetes   Abnormal CT scan, chest    Discharge Instructions  Discharge Instructions     Call MD for:  difficulty breathing, headache or visual disturbances   Complete by: As  directed    Call MD for:  extreme fatigue   Complete by: As directed    Call MD for:  persistant dizziness or light-headedness   Complete by: As directed    Call MD for:  persistant nausea and vomiting   Complete by: As directed    Call MD for:  severe uncontrolled pain   Complete by: As directed    Call MD for:  temperature >100.4   Complete by: As directed    Diet - low sodium heart healthy   Complete by: As directed    Increase activity slowly   Complete by: As directed       Allergies as of 11/14/2023       Reactions   Atorvastatin  Other (See Comments)   Severe myalgias- just paralyzed me; I can't take it   Crestor  [rosuvastatin ] Other (See Comments)   Myalgias    Gabapentin  Other (See Comments)   Caused unwanted sedation (300 mg strength)   Hydrocodone -acetaminophen  Hives, Rash        Medication List     STOP taking these medications    benzonatate  100 MG capsule Commonly known as: TESSALON    colchicine  0.6 MG tablet   dextromethorphan  30 MG/5ML liquid Commonly known as: DELSYM    docusate sodium  100 MG capsule Commonly known as: COLACE   guaiFENesin  600 MG 12 hr tablet Commonly known as: MUCINEX    pantoprazole  40 MG tablet Commonly known as: PROTONIX        TAKE these medications    albuterol  (2.5 MG/3ML) 0.083% nebulizer solution Commonly known as: PROVENTIL  INHALE 3 ML EVERY 6 HOURS AS NEEDED FOR 30 DAYS What  changed: See the new instructions.   allopurinol  100 MG tablet Commonly known as: ZYLOPRIM  Take 100 mg by mouth in the morning.   apixaban  5 MG Tabs tablet Commonly known as: Eliquis  Take 1 tablet (5 mg total) by mouth 2 (two) times daily.   budesonide -glycopyrrolate -formoterol  160-9-4.8 MCG/ACT Aero inhaler Commonly known as: BREZTRI  Inhale 2 puffs into the lungs 2 (two) times daily.   Ensure Plant-Based Protein Liqd Take 330 mLs by mouth 2 (two) times daily between meals. What changed: Another medication with the same name  was removed. Continue taking this medication, and follow the directions you see here.   finasteride  5 MG tablet Commonly known as: PROSCAR  Take 5 mg by mouth in the morning. Reported on 05/04/2015   furosemide  40 MG tablet Commonly known as: LASIX  Take 20-40 mg by mouth in the morning.   ipratropium-albuterol  0.5-2.5 (3) MG/3ML Soln Commonly known as: DUONEB Take 3 mLs by nebulization every 4 (four) hours as needed (shortness of breath/wheeze).   metoprolol  tartrate 25 MG tablet Commonly known as: LOPRESSOR  TAKE 1 TABLET TWICE DAILY What changed: when to take this   nitroGLYCERIN  0.4 MG SL tablet Commonly known as: NITROSTAT  Place 0.4 mg under the tongue every 5 (five) minutes as needed for chest pain.   OXYGEN  Inhale 2-2.5 L/min into the lungs continuous.   pravastatin  20 MG tablet Commonly known as: PRAVACHOL  Take 1 tablet (20 mg total) by mouth every evening. What changed: when to take this   predniSONE  10 MG tablet Commonly known as: DELTASONE  Take 4 tablets (40 mg total) by mouth daily for 2 days, THEN 3 tablets (30 mg total) daily for 2 days, THEN 2 tablets (20 mg total) daily for 2 days, THEN 1 tablet (10 mg total) daily for 2 days. Start taking on: November 15, 2023   pregabalin  50 MG capsule Commonly known as: LYRICA  Take 1 capsule (50 mg total) by mouth 2 (two) times daily.   QUEtiapine  25 MG tablet Commonly known as: SEROQUEL  Take 1 tablet (25 mg total) by mouth at bedtime. What changed: Another medication with the same name was removed. Continue taking this medication, and follow the directions you see here.   Tylenol  325 MG tablet Generic drug: acetaminophen  Take 325-650 mg by mouth every 6 (six) hours as needed for mild pain (pain score 1-3) or headache.        Follow-up Information     [facility] Follow up.   Specialty: Home Health Services Why: Your home health services will resume with [facility]. The office will call you  to set up date to resume services. Contact information: [address] [place] [address] [phone]         Okey Carlin Redbird, MD. Schedule an appointment as soon as possible for a visit in 1 week(s).   Specialty: Family Medicine Contact information: [address] [place] [address] [phone]         Kara Dorn NOVAK, MD. Schedule an appointment as soon as possible for a visit in 2 week(s).   Specialty: Pulmonary Disease Contact information: [address] [place] [address] [phone]         Michele Richardson, DO Follow up.   Specialties: Cardiology, Vascular Surgery Why: As needed Contact information: [address] [place] [address] [phone]                Allergies  Allergen Reactions   Atorvastatin  Other (  See Comments)    Severe myalgias- just paralyzed me; I can't take it   Crestor  [Rosuvastatin ] Other (See Comments)    Myalgias    Gabapentin  Other (See Comments)    Caused unwanted sedation (300 mg strength)   Hydrocodone -Acetaminophen  Hives and Rash    Consultations: None   Procedures/Studies: DG Swallowing Func-Speech Pathology Result Date: 11/11/2023 Table formatting from the original result was not included. Modified Barium Swallow Study Patient Details Name: DEVEAN SKOCZYLAS MRN: [id number] Date of Birth: [birth year] Today's Date: 11/11/2023 HPI/PMH: HPI: Patient is an 83 y.o. male with PMH: COPD, chronically on 2L via [place], afib, CKD stage III, legal blindness, recently discharged from [facility] after stay for acute COPD exacerbation. He presented back to the hospital on 11/05/23 with progressive SOB, having a constant cough. EMS reported oxygen  saturations at 82% on his baseline 2L oxygen . SLP swallow evaluation ordered on 11/08/23 to ensure patient is not suffering from bouts of aspiration. Clinical Impression: Clinical Impression: Patient presents with a mild oropharyngeal dysphagia as per  this MBS. Prominent cricopharyngeal bar observed (no radiologist present to confirm), which did result in barium briefly becoming lodged above cp bar during transit. Swallow initiated at level of pyriform sinus with thin liquids and at level of vallecular sinus with thicker liquids and puree solids. When trying to swallow 13mm barium tablet, patient unable to maintain cohesive bolus with thin liquid barium. This resulted in pill remaining in oral cavity, and patient exhibiting penetration to the vocal cords (PAS 5).  No other instances of penetration or aspiration occured. Patient indicated that he crushes his pills up and does not take them whole at baseline. SLP recommending patient continue on current PO diet, continue to follow the precautions he has told SLP he follows (small bites, chews food well, takes his time, etc.) No further skilled SLP intevention warranted at this time. Factors that may increase risk of adverse event in presence of aspiration Noe & Lianne 2021): Factors that may increase risk of adverse event in presence of aspiration Noe & Lianne 2021): Frail or deconditioned; Poor general health and/or compromised immunity Recommendations/Plan: Swallowing Evaluation Recommendations Swallowing Evaluation Recommendations Recommendations: PO diet PO Diet Recommendation: Thin liquids (Level 0); Regular Liquid Administration via: Cup; Straw Medication Administration: Other (Comment) (whole or crushed in puree) Supervision: Patient able to self-feed Swallowing strategies  : Small bites/sips; Slow rate Postural changes: Position pt fully upright for meals Oral care recommendations: Oral care BID (2x/day) Treatment Plan Treatment Plan Treatment recommendations: No treatment recommended at this time Follow-up recommendations: No SLP follow up Functional status assessment: Patient has not had a recent decline in their functional status. Recommendations Recommendations for follow up therapy are one  component of a multi-disciplinary discharge planning process, led by the attending physician.  Recommendations may be updated based on patient status, additional functional criteria and insurance authorization. Assessment: Orofacial Exam: No data recorded Anatomy: Anatomy: Prominent cricopharyngeus Boluses Administered: Boluses Administered Boluses Administered: Thin liquids (Level 0); Mildly thick liquids (Level 2, nectar thick); Moderately thick liquids (Level 3, honey thick); Puree  Oral Impairment Domain: Oral Impairment Domain Lip Closure: No labial escape Tongue control during bolus hold: Not tested Bolus preparation/mastication: Timely and efficient chewing and mashing Bolus transport/lingual motion: Brisk tongue motion Oral residue: Complete oral clearance Location of oral residue : N/A Initiation of pharyngeal swallow : Valleculae; Pyriform sinuses  Pharyngeal Impairment Domain: Pharyngeal Impairment Domain Soft palate elevation: No bolus between soft palate (SP)/pharyngeal wall (PW) Laryngeal elevation: Complete superior movement  of thyroid cartilage with complete approximation of arytenoids to epiglottic petiole Anterior hyoid excursion: Complete anterior movement Epiglottic movement: Complete inversion Laryngeal vestibule closure: Incomplete, narrow column air/contrast in laryngeal vestibule Pharyngeal stripping wave : Present - complete Pharyngeal contraction (A/P view only): N/A Pharyngoesophageal segment opening: Complete distension and complete duration, no obstruction of flow Tongue base retraction: No contrast between tongue base and posterior pharyngeal wall (PPW) Pharyngeal residue: Trace residue within or on pharyngeal structures Location of pharyngeal residue: Valleculae  Esophageal Impairment Domain: Esophageal Impairment Domain Esophageal clearance upright position: Complete clearance, esophageal coating Pill: Pill Consistency administered: Thin liquids (Level 0) Thin liquids (Level 0):  Impaired (see clinical impressions) Penetration/Aspiration Scale Score: Penetration/Aspiration Scale Score 1.  Material does not enter airway: Mildly thick liquids (Level 2, nectar thick); Moderately thick liquids (Level 3, honey thick); Puree; Pill 5.  Material enters airway, CONTACTS cords and not ejected out: Thin liquids (Level 0) Compensatory Strategies: Compensatory Strategies Compensatory strategies: No   General Information: No data recorded Diet Prior to this Study: Regular; Thin liquids (Level 0)   Temperature : Normal   Respiratory Status: WFL   Supplemental O2: Nasal cannula   History of Recent Intubation: No  Behavior/Cognition: Alert; Cooperative; Pleasant mood Self-Feeding Abilities: Able to self-feed Baseline vocal quality/speech: Normal Volitional Cough: Able to elicit Volitional Swallow: Able to elicit Exam Limitations: No limitations Goal Planning: Prognosis for improved oropharyngeal function: Good No data recorded No data recorded Patient/Family Stated Goal: to stay hospitalized until more fully recovered Consulted and agree with results and recommendations: Patient Pain: Pain Assessment Pain Assessment: No/denies pain Faces Pain Scale: 0 End of Session: Start Time:SLP Start Time (ACUTE ONLY): 1405 Stop Time: SLP Stop Time (ACUTE ONLY): 1430 Time Calculation:SLP Time Calculation (min) (ACUTE ONLY): 25 min Charges: SLP Evaluations $ SLP Speech Visit: 1 Visit SLP Evaluations $MBS Swallow: 1 Procedure SLP visit diagnosis: SLP Visit Diagnosis: Dysphagia, oropharyngeal phase (R13.12) Past Medical History: Past Medical History: Diagnosis Date  Anxiety   BPH (benign prostatic hypertrophy)   CAD (coronary artery disease) 2007  with LAD/Diag CBPTCA and kissing   CKD (chronic kidney disease), stage III (HCC)   thelbert 01/04/2017  COPD (chronic obstructive pulmonary disease) (HCC)   maybe (02/14/2012)  Depression   GERD (gastroesophageal reflux disease)   Hyperlipidemia   Hypertension   Hypoxemia    Myocardial infarction (HCC) 01/22/2012  NSTEMI with DES to RSA (PDA)  On home oxygen  therapy   Shortness of breath   walking and laying down sometimes (02/14/2012) Past Surgical History: Past Surgical History: Procedure Laterality Date  CATARACT EXTRACTION W/ INTRAOCULAR LENS  IMPLANT, BILATERAL  ~ 2010  CORONARY ANGIOPLASTY WITH STENT PLACEMENT  05/11/2005  PCI OF LAD  GANGLION CYST REMOVED  1956?  right  LEFT HEART CATHETERIZATION WITH CORONARY ANGIOGRAM Bilateral 01/24/2012  Procedure: LEFT HEART CATHETERIZATION WITH CORONARY ANGIOGRAM;  Surgeon: Victory LELON Claudene DOUGLAS, MD;  Location: [facility] CATH LAB;  Service: Cardiovascular;  Laterality: Bilateral;  REFRACTIVE SURGERY  2010  left Norleen IVAR Blase, MA, CCC-SLP Speech Therapy   CT CHEST W CONTRAST Result Date: 11/07/2023 CLINICAL DATA:  Bronchiectasis, respiratory failure EXAM: CT CHEST WITH CONTRAST TECHNIQUE: Multidetector CT imaging of the chest was performed during intravenous contrast administration. RADIATION DOSE REDUCTION: This exam was performed according to the departmental dose-optimization program which includes automated exposure control, adjustment of the mA and/or kV according to patient size and/or use of iterative reconstruction technique. CONTRAST:  75mL OMNIPAQUE  IOHEXOL  300 MG/ML  SOLN COMPARISON:  Chest radiograph November 05, 2023, chest CT January 12, 2022 FINDINGS: Exam is suboptimal due to breathing motion artifact. Lower neck: Prominent collateral veins are identified in anterior lower neck. Query circumferential wall thickening/narrowing of the right brachiocephalic vein posterior to the right clavicular head (2/16). Alternatively this may represent mixing artifact on this nondedicated exam. Cardiovascular: The heart size is normal. Atherosclerotic calcifications of coronary arteries. No pericardial fluid. Mediastinum/Nodes: No enlarged mediastinal, hilar, or axillary lymph nodes. Thyroid gland, trachea, and esophagus demonstrate no significant  findings. Lungs/Pleura: Bilateral lung base consolidations with associated bronchiectasis measuring 4.9 x 2.2 cm on right and 4.9 x 3.5 cm on the left, new to prior CT. Additional scattered pulmonary micro nodules and foci of solid, ground-glass and subsolid nodules for example in left upper lobe measuring up to 7 mm for example 5/31, right lower lobe 5/54, 59, right upper lobe 5/50, 16. These nodules are stable to prior. A previously seen right upper lobe pulmonary nodule on prior exams from 2023 is resolved likely due to resolution of infection/inflammation. Trachea and major airways are patent. Bronchial and bronchiolar wall thickening. No pleural effusion. Upper Abdomen: No acute abnormality. Musculoskeletal: Sclerotic lesions are right second and left fourth ribs. Multilevel degenerative changes of the spine and scoliosis. Chronic healed fracture of the sternum. The IMPRESSION: Bilateral new lung base consolidations with air bronchograms suggestive of multifocal infection/inflammation including post aspiration pneumonias. Recommend follow-up to ensure complete resolution. Additional scattered pulmonary nodules as detailed above are stable to prior. Follow-up in 1 year to ensure stability. Incidental note was made of collateral veins in lower neck with questionable partial obstruction/scarring of right brachiocephalic vein, incompletely assessed on current nondedicated CT. Recommend dedicated dynamic CT venogram neck for further assessment if clinically warranted. Electronically Signed   By: Megan  Zare M.D.   On: 11/07/2023 14:10   DG Chest Port 1 View Result Date: 11/05/2023 CLINICAL DATA:  Sepsis EXAM: PORTABLE CHEST 1 VIEW COMPARISON:  10/19/2023 FINDINGS: Atherosclerotic calcification of the aortic arch. The lungs appear clear. No blunting of the costophrenic angles. Heart size within normal limits. Mild fullness of the right upper mediastinal region or should be did to mediastinal adipose tissues based on  correlation with chest CT of 01/12/2022. IMPRESSION: 1. No acute findings. 2. Aortic Atherosclerosis (ICD10-I70.0). Electronically Signed   By: Ryan Salvage M.D.   On: 11/05/2023 11:33   DG Foot 2 Views Right Result Date: 10/24/2023 CLINICAL DATA:  History of gout with right foot pain EXAM: RIGHT FOOT - 2 VIEW COMPARISON:  None Available. FINDINGS: There is no evidence of fracture or dislocation. Questionable subtle juxta-articular erosion at the medial base of the great toe proximal phalanx, which can be seen in the setting of gout. Degenerative changes of the tarsometatarsal joint. Plantar calcaneal spur. Soft tissues are unremarkable. IMPRESSION: 1. Questionable subtle juxta-articular erosion at the medial base of the great toe proximal phalanx, which can be seen in the setting of gout. 2. Degenerative changes of the tarsometatarsal joint. Electronically Signed   By: Limin  Xu M.D.   On: 10/24/2023 09:17   DG Chest Port 1V same Day Result Date: 10/19/2023 CLINICAL DATA:  Shortness of breath and dyspnea. EXAM: PORTABLE CHEST 1 VIEW COMPARISON:  10/12/2023 FINDINGS: Heart size and mediastinal contours are unremarkable. There is no pleural fluid, interstitial edema or airspace disease. The visualized osseous structures are unremarkable. IMPRESSION: No active disease. Electronically Signed   By: Waddell Calk M.D.   On: 10/19/2023 14:03  Subjective: Patient seen examined bedside, lying in bed.  Remains on 2 L nasal cannula with SpO2 97%.  Discharging home with prednisone  taper.  Frustrated that he keeps being asked about his home medications reports I do not know my medications my ex-wife manages them.  No other questions or concerns at this time.  Denies headache, no dizziness, no chest pain, no palpitations, no shortness of breath greater than his typical baseline, no abdominal pain, no fever/chills/night sweats, no nausea/vomiting/diarrhea, no focal weakness, no fatigue, no paresthesias.  No  acute events overnight per nurse staff.  Discharge Exam: Vitals:   11/14/23 0804 11/14/23 0939  BP:  118/69  Pulse:  83  Resp:    Temp:    SpO2: 97%    Vitals:   11/14/23 0500 11/14/23 0623 11/14/23 0804 11/14/23 0939  BP:  (!) 140/77  118/69  Pulse:  73  83  Resp:  14    Temp:  97.7 F (36.5 C)    TempSrc:  Oral    SpO2:  97% 97%   Weight: 104.8 kg     Height:        Physical Exam: GEN: NAD, alert and oriented x 3, obese, chronically ill appearance, appears older than stated age HEENT: NCAT, PERRL, EOMI, sclera clear, MMM PULM: Breath sounds diminished lateral bases, no wheezes/crackles, normal respiratory effort without accessory muscle use, on 2 L [place] which is at baseline with SpO2 97% CV: RRR w/o M/G/R GI: abd soft, NTND, +BS MSK: no peripheral edema, moves all extremity independently NEURO: CN II-XII intact, no focal deficits, sensation to light touch intact PSYCH: normal mood/affect Integumentary: Chronic venous changes bilateral lower extremities, no concerning rashes/lesions/wounds noted on exposed skin surfaces       The results of significant diagnostics from this hospitalization (including imaging, microbiology, ancillary and laboratory) are listed below for reference.     Microbiology: Recent Results (from the past 240 hours)  C Difficile Quick Screen w PCR reflex     Status: None   Collection Time: 11/05/23 10:47 AM   Specimen: STOOL  Result Value Ref Range Status   C Diff antigen NEGATIVE NEGATIVE Final   C Diff toxin NEGATIVE NEGATIVE Final   C Diff interpretation No C. difficile detected.  Final    Comment: Performed at [facility], [address], [place], [place] [address]  Blood Culture (routine x 2)     Status: None   Collection Time: 11/05/23 11:03 AM   Specimen: BLOOD  Result Value Ref Range Status   Specimen Description   Final    BLOOD RIGHT ANTECUBITAL Performed at [facility], [address], [place], [place] [address]    Special Requests   Final    BOTTLES DRAWN AEROBIC AND ANAEROBIC Blood Culture adequate volume Performed at [facility], [address], [place], [place] [address]    Culture   Final    NO GROWTH 5 DAYS Performed at [facility], [address] [place], [place] [address]    Report Status 11/10/2023 FINAL  Final  Gastrointestinal Panel by PCR , Stool     Status: None   Collection Time: 11/05/23 11:04 AM   Specimen: STOOL  Result Value Ref Range Status   Campylobacter species NOT DETECTED NOT DETECTED Final   Plesimonas shigelloides NOT DETECTED NOT DETECTED Final   Salmonella species NOT DETECTED NOT DETECTED Final   Yersinia enterocolitica NOT DETECTED NOT DETECTED Final  Vibrio species NOT DETECTED NOT DETECTED Final   Vibrio cholerae NOT DETECTED NOT DETECTED Final   Enteroaggregative E coli (EAEC) NOT DETECTED NOT DETECTED Final   Enteropathogenic E coli (EPEC) NOT DETECTED NOT DETECTED Final   Enterotoxigenic E coli (ETEC) NOT DETECTED NOT DETECTED Final   Shiga like toxin producing E coli (STEC) NOT DETECTED NOT DETECTED Final   Shigella/Enteroinvasive E coli (EIEC) NOT DETECTED NOT DETECTED Final   Cryptosporidium NOT DETECTED NOT DETECTED Final   Cyclospora cayetanensis NOT DETECTED NOT DETECTED Final   Entamoeba histolytica NOT DETECTED NOT DETECTED Final   Giardia lamblia NOT DETECTED NOT DETECTED Final   Adenovirus F40/41 NOT DETECTED NOT DETECTED Final   Astrovirus NOT DETECTED NOT DETECTED Final   Norovirus GI/GII NOT DETECTED NOT DETECTED Final   Rotavirus A NOT DETECTED NOT DETECTED Final   Sapovirus (I, II, IV, and V) NOT DETECTED NOT DETECTED Final    Comment: Performed at [facility], [address] [place], [place] [address]  Blood Culture (routine x 2)     Status: None   Collection Time: 11/05/23 11:30 AM   Specimen: BLOOD RIGHT HAND  Result Value Ref Range Status   Specimen Description    Final    BLOOD RIGHT HAND Performed at [facility], [address] [place], [place] [address]    Special Requests   Final    BOTTLES DRAWN AEROBIC ONLY Blood Culture results may not be optimal due to an inadequate volume of blood received in culture bottles Performed at [facility], [address], [place], [place] [address]    Culture   Final    NO GROWTH 5 DAYS Performed at [facility], [address] [place], [place] [address]    Report Status 11/10/2023 FINAL  Final  Resp panel by RT-PCR (RSV, Flu A&B, Covid) Anterior Nasal Swab     Status: None   Collection Time: 11/05/23 11:30 AM   Specimen: Anterior Nasal Swab  Result Value Ref Range Status   SARS Coronavirus 2 by RT PCR NEGATIVE NEGATIVE Final    Comment: (NOTE) SARS-CoV-2 target nucleic acids are NOT DETECTED.  The SARS-CoV-2 RNA is generally detectable in upper respiratory specimens during the acute phase of infection. The lowest concentration of SARS-CoV-2 viral copies this assay can detect is 138 copies/mL. A negative result does not preclude SARS-Cov-2 infection and should not be used as the sole basis for treatment or other patient management decisions. A negative result may occur with  improper specimen collection/handling, submission of specimen other than nasopharyngeal swab, presence of viral mutation(s) within the areas targeted by this assay, and inadequate number of viral copies(<138 copies/mL). A negative result must be combined with clinical observations, patient history, and epidemiological information. The expected result is Negative.  Fact Sheet for Patients:  BloggerCourse.com  Fact Sheet for Healthcare Providers:  SeriousBroker.it  This test is no t yet approved or cleared by the [place]  FDA and  has been authorized for detection and/or diagnosis of SARS-CoV-2 by FDA under an Emergency Use Authorization  (EUA). This EUA will remain  in effect (meaning this test can be used) for the duration of the COVID-19 declaration under Section 564(b)(1) of the Act, 21 U.S.C.section 360bbb-3(b)(1), unless the authorization is terminated  or revoked sooner.       Influenza A by PCR NEGATIVE NEGATIVE Final   Influenza B by PCR NEGATIVE NEGATIVE Final    Comment: (NOTE) The Xpert Xpress  SARS-CoV-2/FLU/RSV plus assay is intended as an aid in the diagnosis of influenza from Nasopharyngeal swab specimens and should not be used as a sole basis for treatment. Nasal washings and aspirates are unacceptable for Xpert Xpress SARS-CoV-2/FLU/RSV testing.  Fact Sheet for Patients: BloggerCourse.com  Fact Sheet for Healthcare Providers: SeriousBroker.it  This test is not yet approved or cleared by the [place]  FDA and has been authorized for detection and/or diagnosis of SARS-CoV-2 by FDA under an Emergency Use Authorization (EUA). This EUA will remain in effect (meaning this test can be used) for the duration of the COVID-19 declaration under Section 564(b)(1) of the Act, 21 U.S.C. section 360bbb-3(b)(1), unless the authorization is terminated or revoked.     Resp Syncytial Virus by PCR NEGATIVE NEGATIVE Final    Comment: (NOTE) Fact Sheet for Patients: BloggerCourse.com  Fact Sheet for Healthcare Providers: SeriousBroker.it  This test is not yet approved or cleared by the [place]  FDA and has been authorized for detection and/or diagnosis of SARS-CoV-2 by FDA under an Emergency Use Authorization (EUA). This EUA will remain in effect (meaning this test can be used) for the duration of the COVID-19 declaration under Section 564(b)(1) of the Act, 21 U.S.C. section 360bbb-3(b)(1), unless the authorization is terminated or revoked.  Performed at [facility], [address] [place], [place] [address]   Respiratory (~20 pathogens) panel by PCR     Status: Abnormal   Collection Time: 11/06/23  6:53 PM   Specimen: Nasopharyngeal Swab; Respiratory  Result Value Ref Range Status   Adenovirus NOT DETECTED NOT DETECTED Final   Coronavirus 229E NOT DETECTED NOT DETECTED Final    Comment: (NOTE) The Coronavirus on the Respiratory Panel, DOES NOT test for the novel  Coronavirus (2019 nCoV)    Coronavirus HKU1 NOT DETECTED NOT DETECTED Final   Coronavirus NL63 NOT DETECTED NOT DETECTED Final   Coronavirus OC43 NOT DETECTED NOT DETECTED Final   Metapneumovirus DETECTED (A) NOT DETECTED Final   Rhinovirus / Enterovirus DETECTED (A) NOT DETECTED Final   Influenza A NOT DETECTED NOT DETECTED Final   Influenza B NOT DETECTED NOT DETECTED Final   Parainfluenza Virus 1 NOT DETECTED NOT DETECTED Final   Parainfluenza Virus 2 NOT DETECTED NOT DETECTED Final   Parainfluenza Virus 3 NOT DETECTED NOT DETECTED Final   Parainfluenza Virus 4 NOT DETECTED NOT DETECTED Final   Respiratory Syncytial Virus NOT DETECTED NOT DETECTED Final   Bordetella pertussis NOT DETECTED NOT DETECTED Final   Bordetella Parapertussis NOT DETECTED NOT DETECTED Final   Chlamydophila pneumoniae NOT DETECTED NOT DETECTED Final   Mycoplasma pneumoniae NOT DETECTED NOT DETECTED Final    Comment: Performed at [facility], [address], [place], [place] [address]     Labs: BNP (last 3 results) Recent Labs    10/12/23 2142 11/05/23 1104 11/14/23 0659  BNP 41.1 32.3 94.0   Basic Metabolic Panel: Recent Labs  Lab 11/08/23 0518 11/09/23 0555 11/11/23 0530 11/13/23 0627 11/14/23 0659  NA 134* 137 134* 135 137  K 4.6 4.5 4.9 4.7 4.2  CL 99 100 97* 95* 94*  CO2 29 29 30 30 30   GLUCOSE 184* 180* 211* 117* 101*  BUN 25* 31* 41* 38* 40*  CREATININE 1.01 0.94 1.23 1.24 1.28*  CALCIUM  8.5* 8.7* 8.6* 8.8* 8.4*  MG 2.3 2.5*  --   --   --   PHOS 3.2  --   --   --   --  Liver Function  Tests: Recent Labs  Lab 11/08/23 0518 11/09/23 0555 11/11/23 0530  AST 15 15 20   ALT 30 29 36  ALKPHOS 52 52 49  BILITOT 0.6 0.4 0.7  PROT 5.2* 5.2* 4.6*  ALBUMIN 2.4* 2.5* 2.2*   No results for input(s): LIPASE, AMYLASE in the last 168 hours. No results for input(s): AMMONIA in the last 168 hours. CBC: Recent Labs  Lab 11/08/23 0518 11/09/23 0555 11/10/23 0543 11/11/23 0530 11/13/23 0627  WBC 10.5 9.9 11.3* 12.6* 17.4*  NEUTROABS 9.5* 8.9* 9.6* 10.6* 12.2*  HGB 13.2 13.9 14.2 13.6 16.7  HCT 42.0 44.3 43.5 42.9 52.1*  MCV 89.2 89.5 89.1 88.6 90.1  PLT 128* 152 166 205 302   Cardiac Enzymes: No results for input(s): CKTOTAL, CKMB, CKMBINDEX, TROPONINI in the last 168 hours. BNP: Invalid input(s): POCBNP CBG: Recent Labs  Lab 11/13/23 0755 11/13/23 1125 11/13/23 1625 11/13/23 2142 11/14/23 0737  GLUCAP 128* 125* 227* 132* 98   D-Dimer No results for input(s): DDIMER in the last 72 hours. Hgb A1c No results for input(s): HGBA1C in the last 72 hours. Lipid Profile No results for input(s): CHOL, HDL, LDLCALC, TRIG, CHOLHDL, LDLDIRECT in the last 72 hours. Thyroid function studies No results for input(s): TSH, T4TOTAL, T3FREE, THYROIDAB in the last 72 hours.  Invalid input(s): FREET3 Anemia work up No results for input(s): VITAMINB12, FOLATE, FERRITIN, TIBC, IRON, RETICCTPCT in the last 72 hours. Urinalysis    Component Value Date/Time   COLORURINE YELLOW 11/06/2023 0530   APPEARANCEUR CLEAR 11/06/2023 0530   LABSPEC 1.019 11/06/2023 0530   PHURINE 5.0 11/06/2023 0530   GLUCOSEU 150 (A) 11/06/2023 0530   HGBUR NEGATIVE 11/06/2023 0530   BILIRUBINUR NEGATIVE 11/06/2023 0530   KETONESUR NEGATIVE 11/06/2023 0530   PROTEINUR NEGATIVE 11/06/2023 0530   UROBILINOGEN 1.0 04/29/2011 1809   NITRITE NEGATIVE 11/06/2023 0530   LEUKOCYTESUR NEGATIVE 11/06/2023 0530   Sepsis Labs Recent Labs  Lab  11/09/23 0555 11/10/23 0543 11/11/23 0530 11/13/23 0627  WBC 9.9 11.3* 12.6* 17.4*   Microbiology Recent Results (from the past 240 hours)  C Difficile Quick Screen w PCR reflex     Status: None   Collection Time: 11/05/23 10:47 AM   Specimen: STOOL  Result Value Ref Range Status   C Diff antigen NEGATIVE NEGATIVE Final   C Diff toxin NEGATIVE NEGATIVE Final   C Diff interpretation No C. difficile detected.  Final    Comment: Performed at [facility], [address], [place], [place] [address]  Blood Culture (routine x 2)     Status: None   Collection Time: 11/05/23 11:03 AM   Specimen: BLOOD  Result Value Ref Range Status   Specimen Description   Final    BLOOD RIGHT ANTECUBITAL Performed at [facility], [address], [place], [place] [address]    Special Requests   Final    BOTTLES DRAWN AEROBIC AND ANAEROBIC Blood Culture adequate volume Performed at [facility], [address] [place], [place] [address]    Culture   Final    NO GROWTH 5 DAYS Performed at [facility], [address] [place], [place] [address]    Report Status 11/10/2023 FINAL  Final  Gastrointestinal Panel by PCR , Stool     Status: None   Collection Time: 11/05/23 11:04 AM   Specimen: STOOL  Result Value Ref Range Status   Campylobacter species NOT DETECTED NOT DETECTED Final  Plesimonas shigelloides NOT DETECTED NOT DETECTED Final   Salmonella species NOT DETECTED NOT DETECTED Final   Yersinia enterocolitica NOT DETECTED NOT DETECTED Final   Vibrio species NOT DETECTED NOT DETECTED Final   Vibrio cholerae NOT DETECTED NOT DETECTED Final   Enteroaggregative E coli (EAEC) NOT DETECTED NOT DETECTED Final   Enteropathogenic E coli (EPEC) NOT DETECTED NOT DETECTED Final   Enterotoxigenic E coli (ETEC) NOT DETECTED NOT DETECTED Final   Shiga like toxin producing E coli (STEC) NOT DETECTED NOT DETECTED Final   Shigella/Enteroinvasive  E coli (EIEC) NOT DETECTED NOT DETECTED Final   Cryptosporidium NOT DETECTED NOT DETECTED Final   Cyclospora cayetanensis NOT DETECTED NOT DETECTED Final   Entamoeba histolytica NOT DETECTED NOT DETECTED Final   Giardia lamblia NOT DETECTED NOT DETECTED Final   Adenovirus F40/41 NOT DETECTED NOT DETECTED Final   Astrovirus NOT DETECTED NOT DETECTED Final   Norovirus GI/GII NOT DETECTED NOT DETECTED Final   Rotavirus A NOT DETECTED NOT DETECTED Final   Sapovirus (I, II, IV, and V) NOT DETECTED NOT DETECTED Final    Comment: Performed at [facility], [address] [place], [place] [address]  Blood Culture (routine x 2)     Status: None   Collection Time: 11/05/23 11:30 AM   Specimen: BLOOD RIGHT HAND  Result Value Ref Range Status   Specimen Description   Final    BLOOD RIGHT HAND Performed at [facility], [address] [place], [place] [address]    Special Requests   Final    BOTTLES DRAWN AEROBIC ONLY Blood Culture results may not be optimal due to an inadequate volume of blood received in culture bottles Performed at [facility], [address] [place], [place] [address]    Culture   Final    NO GROWTH 5 DAYS Performed at [facility], [address] [place], [place] [address]    Report Status 11/10/2023 FINAL  Final  Resp panel by RT-PCR (RSV, Flu A&B, Covid) Anterior Nasal Swab     Status: None   Collection Time: 11/05/23 11:30 AM   Specimen: Anterior Nasal Swab  Result Value Ref Range Status   SARS Coronavirus 2 by RT PCR NEGATIVE NEGATIVE Final    Comment: (NOTE) SARS-CoV-2 target nucleic acids are NOT DETECTED.  The SARS-CoV-2 RNA is generally detectable in upper respiratory specimens during the acute phase of infection. The lowest concentration of SARS-CoV-2 viral copies this assay can detect is 138 copies/mL. A negative result does not preclude SARS-Cov-2 infection and should not be used as the sole basis for treatment  or other patient management decisions. A negative result may occur with  improper specimen collection/handling, submission of specimen other than nasopharyngeal swab, presence of viral mutation(s) within the areas targeted by this assay, and inadequate number of viral copies(<138 copies/mL). A negative result must be combined with clinical observations, patient history, and epidemiological information. The expected result is Negative.  Fact Sheet for Patients:  BloggerCourse.com  Fact Sheet for Healthcare Providers:  SeriousBroker.it  This test is no t yet approved or cleared by the [place]  FDA and  has been authorized for detection and/or diagnosis of SARS-CoV-2 by FDA under an Emergency Use Authorization (EUA). This EUA will remain  in effect (meaning this test can be used) for the duration of the COVID-19 declaration under Section 564(b)(1) of the Act, 21 U.S.C.section 360bbb-3(b)(1), unless the authorization is terminated  or revoked sooner.  Influenza A by PCR NEGATIVE NEGATIVE Final   Influenza B by PCR NEGATIVE NEGATIVE Final    Comment: (NOTE) The Xpert Xpress SARS-CoV-2/FLU/RSV plus assay is intended as an aid in the diagnosis of influenza from Nasopharyngeal swab specimens and should not be used as a sole basis for treatment. Nasal washings and aspirates are unacceptable for Xpert Xpress SARS-CoV-2/FLU/RSV testing.  Fact Sheet for Patients: BloggerCourse.com  Fact Sheet for Healthcare Providers: SeriousBroker.it  This test is not yet approved or cleared by the [place]  FDA and has been authorized for detection and/or diagnosis of SARS-CoV-2 by FDA under an Emergency Use Authorization (EUA). This EUA will remain in effect (meaning this test can be used) for the duration of the COVID-19 declaration under Section 564(b)(1) of the Act, 21 U.S.C. section  360bbb-3(b)(1), unless the authorization is terminated or revoked.     Resp Syncytial Virus by PCR NEGATIVE NEGATIVE Final    Comment: (NOTE) Fact Sheet for Patients: BloggerCourse.com  Fact Sheet for Healthcare Providers: SeriousBroker.it  This test is not yet approved or cleared by the [place]  FDA and has been authorized for detection and/or diagnosis of SARS-CoV-2 by FDA under an Emergency Use Authorization (EUA). This EUA will remain in effect (meaning this test can be used) for the duration of the COVID-19 declaration under Section 564(b)(1) of the Act, 21 U.S.C. section 360bbb-3(b)(1), unless the authorization is terminated or revoked.  Performed at [facility], [address], [place], [place] [address]   Respiratory (~20 pathogens) panel by PCR     Status: Abnormal   Collection Time: 11/06/23  6:53 PM   Specimen: Nasopharyngeal Swab; Respiratory  Result Value Ref Range Status   Adenovirus NOT DETECTED NOT DETECTED Final   Coronavirus 229E NOT DETECTED NOT DETECTED Final    Comment: (NOTE) The Coronavirus on the Respiratory Panel, DOES NOT test for the novel  Coronavirus (2019 nCoV)    Coronavirus HKU1 NOT DETECTED NOT DETECTED Final   Coronavirus NL63 NOT DETECTED NOT DETECTED Final   Coronavirus OC43 NOT DETECTED NOT DETECTED Final   Metapneumovirus DETECTED (A) NOT DETECTED Final   Rhinovirus / Enterovirus DETECTED (A) NOT DETECTED Final   Influenza A NOT DETECTED NOT DETECTED Final   Influenza B NOT DETECTED NOT DETECTED Final   Parainfluenza Virus 1 NOT DETECTED NOT DETECTED Final   Parainfluenza Virus 2 NOT DETECTED NOT DETECTED Final   Parainfluenza Virus 3 NOT DETECTED NOT DETECTED Final   Parainfluenza Virus 4 NOT DETECTED NOT DETECTED Final   Respiratory Syncytial Virus NOT DETECTED NOT DETECTED Final   Bordetella pertussis NOT DETECTED NOT DETECTED Final   Bordetella  Parapertussis NOT DETECTED NOT DETECTED Final   Chlamydophila pneumoniae NOT DETECTED NOT DETECTED Final   Mycoplasma pneumoniae NOT DETECTED NOT DETECTED Final    Comment: Performed at [facility], [address] [place], [place] [address]     Time coordinating discharge: Over 30 minutes  SIGNED:   Camellia PARAS [place], DO  [facility] 11/14/2023, 10:05 AM

## 2023-11-14 NOTE — Progress Notes (Signed)
 Physical Therapy Treatment Patient Details Name: Mitchell [name] MRN: [id number] DOB: 01/02/1941 Today's Date: 11/14/2023   History of Present Illness 83 y.o. male admitted on 11/05/23 with advanced COPD and chronic respiratory failure with patient suffering from acute bronchitis secondary to metapneumovirus and rhinovirus seen on respiratory viral panel.   Past medical history significant for COPD chronically on 2-3 L nasal cannula oxygen , paroxysmal atrial fibrillation on Eliquis , CKD stage III, legal blindness recently discharged from [facility] on 7/11 after stay for acute exacerbation of COPD and acute on chronic diastolic CHF    PT Comments  Today's PT session focused on bed mobility and transfers. Pt progressing toward acute PT goals with transfer sand bed mobility requiring decreased assist this date. Pt able to perform static and dynamic tasks at EOB related to daily cares per pt request prior to performance of OOB mobility.Pt able to perform step transfers from EOB to [facility] and from [facility] to recliner chair. Pt deferring additional mobility and and ambulation due to fatigue following. Discussed with in house mobility specialists for plan to mobilize with pt later following rest with goal of ambulation as pt agreeable to ambulate following time for rest. Pt will benefit from continued skilled PT to increase their independence and maximize safety with mobility.     If plan is discharge home, recommend the following: A little help with walking and/or transfers;A little help with bathing/dressing/bathroom;Assistance with cooking/housework;Help with stairs or ramp for entrance;Assist for transportation   Can travel by private vehicle     Yes  Equipment Recommendations  None recommended by PT    Recommendations for Other Services       Precautions / Restrictions Precautions Precautions: Fall Restrictions Weight Bearing Restrictions Per Provider Order: No     Mobility  Bed Mobility Overal bed  mobility: Needs Assistance Bed Mobility: Supine to Sit     Supine to sit: Min assist     General bed mobility comments: MIN A for trunk to upright and to bring R LE to EOB due to reported stiffness  after being in bed all night.    Transfers Overall transfer level: Needs assistance Equipment used: Rolling walker (2 wheels), None Transfers: Sit to/from Stand Sit to Stand: Contact guard assist   Step pivot transfers: Contact guard assist       General transfer comment: STS x2. Progressed to close supervision on 2nd stand. Increased time sitting EOB after seated tasks for LE circulation interventions- pt reports he needs to get his legs going. Able to stand for prolonged period for use of urinal and perform step transfer from EOB to [facility] with use of RW. Pt pushing away and declining use of RW and utilizing HHA and bedrail to perform step transfer from [facility] to recliner.    Ambulation/Gait               General Gait Details: Pt deferring ambulation reporting increased fatigue following seated balance tasks and transfers. Only agreeable to transfers during session- discussed with mobility specialists to assist pt with mobility later today with goal of ambulation.   Stairs             Wheelchair Mobility     Tilt Bed    Modified Rankin (Stroke Patients Only)       Balance Overall balance assessment: Needs assistance Sitting-balance support: Feet supported, No upper extremity supported Sitting balance-Leahy Scale: Normal Sitting balance - Comments: able to perform dynamic and static tasks sitting EOB - per pt request  able to wash at EOB and perform reaching outside BOS laterally and forward for hygiene products   Standing balance support: Bilateral upper extremity supported, Reliant on assistive device for balance Standing balance-Leahy Scale: Poor Standing balance comment: reliant on external support                            Communication  Communication Communication: Impaired Factors Affecting Communication: Hearing impaired  Cognition Arousal: Alert Behavior During Therapy: WFL for tasks assessed/performed   PT - Cognitive impairments: No apparent impairments                       PT - Cognition Comments: HOH and legally blind. Required increased encouragement this date. Requires increased time due to specific pt preferences and each task step by step. Following commands: Intact      Cueing Cueing Techniques: Verbal cues  Exercises      General Comments General comments (skin integrity, edema, etc.): O2 on pt forehead upon entry, RN present and aware. O2 also removed by pt while bathing at EOB (per pt request). O2 assessed and reading 87%, HR 119bpm. Pt placed back on [place] at 3L for O2 recovery (was on 2L, pt request 3L- uses 2-3L baseline at home)      Pertinent Vitals/Pain Pain Assessment Pain Assessment: Faces Faces Pain Scale: Hurts little more Pain Location: head (headache) Pain Descriptors / Indicators: Headache Pain Intervention(s): RN gave pain meds during session, Monitored during session, Limited activity within patient's tolerance    Home Living                          Prior Function            PT Goals (current goals can now be found in the care plan section) Acute Rehab PT Goals Patient Stated Goal: return home PT Goal Formulation: With patient Time For Goal Achievement: 11/23/23 Potential to Achieve Goals: Fair Progress towards PT goals: Progressing toward goals    Frequency    Min 2X/week      PT Plan      Co-evaluation              AM-PAC PT 6 Clicks Mobility   Outcome Measure  Help needed turning from your back to your side while in a flat bed without using bedrails?: A Little Help needed moving from lying on your back to sitting on the side of a flat bed without using bedrails?: A Little Help needed moving to and from a bed to a chair (including  a wheelchair)?: A Little Help needed standing up from a chair using your arms (e.g., wheelchair or bedside chair)?: A Little Help needed to walk in hospital room?: A Little Help needed climbing 3-5 steps with a railing? : A Lot 6 Click Score: 17    End of Session Equipment Utilized During Treatment: Oxygen  Activity Tolerance: Patient tolerated treatment well Patient left: in chair;with call bell/phone within reach;with chair alarm set Nurse Communication: Mobility status PT Visit Diagnosis: Other abnormalities of gait and mobility (R26.89);Muscle weakness (generalized) (M62.81)     Time: 9056-8963 PT Time Calculation (min) (ACUTE ONLY): 53 min  Charges:    $Therapeutic Activity: 38-52 mins PT General Charges $$ ACUTE PT VISIT: 1 Visit                     Tinnie GRADE., PT,  DPT  Acute Rehabilitation Services  Office [phone]  11/14/2023, 11:55 AM

## 2023-11-14 NOTE — Progress Notes (Signed)
 Patient very upset & agitated s/p family leaving and refused care throughout the shift.

## 2023-11-14 NOTE — Progress Notes (Signed)
 Pt received water with ice from nurse tech. Nurse tech also repositioned pt in the bed. Pt informed me and nurse tech of breakfast order. This nurse ordered pts breakfast.

## 2023-11-14 NOTE — TOC Transition Note (Signed)
 Transition of Care [facility]) - Discharge Note   Patient Details  Name: Mitchell [name] MRN: [id number] Date of Birth: 05/02/1940  Transition of [facility] CM/SW Contact:  Toy LITTIE Agar, RN Phone Number:[phone]  11/14/2023, 10:22 AM   Clinical Narrative:    Patient with discharge orders. HH has previously been set up. No other TOC needs noted.    Final next level of care: Home w Home Health Services Barriers to Discharge: No Barriers Identified   Patient Goals and CMS Choice            Discharge Placement                       Discharge Plan and Services Additional resources added to the After Visit Summary for                  DME Arranged: N/A DME Agency: NA       HH Arranged: NA HH Agency: NA        Social Drivers of Health (SDOH) Interventions SDOH Screenings   Food Insecurity: Food Insecurity Present (11/05/2023)  Housing: Low Risk  (11/05/2023)  Transportation Needs: Unmet Transportation Needs (11/05/2023)  Utilities: At Risk (11/05/2023)  Alcohol  Screen: Low Risk  (08/24/2021)  Depression (PHQ2-9): Low Risk  (02/07/2019)  Social Connections: Socially Isolated (11/05/2023)  Tobacco Use: Medium Risk (11/05/2023)     Readmission Risk Interventions    09/17/2023   12:02 PM  Readmission Risk Prevention Plan  Transportation Screening Complete  PCP or Specialist Appt within 3-5 Days Complete  HRI or Home Care Consult Complete  Social Work Consult for Recovery Care Planning/Counseling Complete  Palliative Care Screening Not Applicable  Medication Review Oceanographer) Complete

## 2023-11-14 NOTE — Plan of Care (Signed)

## 2023-11-14 NOTE — Care Management Important Message (Signed)
 Important Message  Patient Details IM Letter given. Name: Mitchell [name] MRN: [id number] Date of Birth: 25-Jul-1940   Important Message Given:  Yes - Medicare IM     Melba Ates 11/14/2023, 11:25 AM

## 2023-11-15 DIAGNOSIS — J449 Chronic obstructive pulmonary disease, unspecified: Secondary | ICD-10-CM | POA: Diagnosis not present

## 2023-11-17 DIAGNOSIS — M1030 Gout due to renal impairment, unspecified site: Secondary | ICD-10-CM | POA: Diagnosis not present

## 2023-11-17 DIAGNOSIS — N1831 Chronic kidney disease, stage 3a: Secondary | ICD-10-CM | POA: Diagnosis not present

## 2023-11-17 DIAGNOSIS — J9621 Acute and chronic respiratory failure with hypoxia: Secondary | ICD-10-CM | POA: Diagnosis not present

## 2023-11-17 DIAGNOSIS — J470 Bronchiectasis with acute lower respiratory infection: Secondary | ICD-10-CM | POA: Diagnosis not present

## 2023-11-17 DIAGNOSIS — J123 Human metapneumovirus pneumonia: Secondary | ICD-10-CM | POA: Diagnosis not present

## 2023-11-17 DIAGNOSIS — I130 Hypertensive heart and chronic kidney disease with heart failure and stage 1 through stage 4 chronic kidney disease, or unspecified chronic kidney disease: Secondary | ICD-10-CM | POA: Diagnosis not present

## 2023-11-17 DIAGNOSIS — J441 Chronic obstructive pulmonary disease with (acute) exacerbation: Secondary | ICD-10-CM | POA: Diagnosis not present

## 2023-11-17 DIAGNOSIS — I5033 Acute on chronic diastolic (congestive) heart failure: Secondary | ICD-10-CM | POA: Diagnosis not present

## 2023-11-17 DIAGNOSIS — I2510 Atherosclerotic heart disease of native coronary artery without angina pectoris: Secondary | ICD-10-CM | POA: Diagnosis not present

## 2023-11-18 DIAGNOSIS — J470 Bronchiectasis with acute lower respiratory infection: Secondary | ICD-10-CM | POA: Diagnosis not present

## 2023-11-18 DIAGNOSIS — J9621 Acute and chronic respiratory failure with hypoxia: Secondary | ICD-10-CM | POA: Diagnosis not present

## 2023-11-18 DIAGNOSIS — J123 Human metapneumovirus pneumonia: Secondary | ICD-10-CM | POA: Diagnosis not present

## 2023-11-18 DIAGNOSIS — J441 Chronic obstructive pulmonary disease with (acute) exacerbation: Secondary | ICD-10-CM | POA: Diagnosis not present

## 2023-11-18 DIAGNOSIS — I2510 Atherosclerotic heart disease of native coronary artery without angina pectoris: Secondary | ICD-10-CM | POA: Diagnosis not present

## 2023-11-18 DIAGNOSIS — N1831 Chronic kidney disease, stage 3a: Secondary | ICD-10-CM | POA: Diagnosis not present

## 2023-11-18 DIAGNOSIS — I130 Hypertensive heart and chronic kidney disease with heart failure and stage 1 through stage 4 chronic kidney disease, or unspecified chronic kidney disease: Secondary | ICD-10-CM | POA: Diagnosis not present

## 2023-11-18 DIAGNOSIS — I5033 Acute on chronic diastolic (congestive) heart failure: Secondary | ICD-10-CM | POA: Diagnosis not present

## 2023-11-18 DIAGNOSIS — M1030 Gout due to renal impairment, unspecified site: Secondary | ICD-10-CM | POA: Diagnosis not present

## 2023-11-19 DIAGNOSIS — J470 Bronchiectasis with acute lower respiratory infection: Secondary | ICD-10-CM | POA: Diagnosis not present

## 2023-11-19 DIAGNOSIS — J123 Human metapneumovirus pneumonia: Secondary | ICD-10-CM | POA: Diagnosis not present

## 2023-11-19 DIAGNOSIS — I130 Hypertensive heart and chronic kidney disease with heart failure and stage 1 through stage 4 chronic kidney disease, or unspecified chronic kidney disease: Secondary | ICD-10-CM | POA: Diagnosis not present

## 2023-11-19 DIAGNOSIS — N1831 Chronic kidney disease, stage 3a: Secondary | ICD-10-CM | POA: Diagnosis not present

## 2023-11-19 DIAGNOSIS — J9621 Acute and chronic respiratory failure with hypoxia: Secondary | ICD-10-CM | POA: Diagnosis not present

## 2023-11-19 DIAGNOSIS — I2510 Atherosclerotic heart disease of native coronary artery without angina pectoris: Secondary | ICD-10-CM | POA: Diagnosis not present

## 2023-11-19 DIAGNOSIS — J441 Chronic obstructive pulmonary disease with (acute) exacerbation: Secondary | ICD-10-CM | POA: Diagnosis not present

## 2023-11-19 DIAGNOSIS — M1030 Gout due to renal impairment, unspecified site: Secondary | ICD-10-CM | POA: Diagnosis not present

## 2023-11-19 DIAGNOSIS — I5033 Acute on chronic diastolic (congestive) heart failure: Secondary | ICD-10-CM | POA: Diagnosis not present

## 2023-11-21 DIAGNOSIS — I5033 Acute on chronic diastolic (congestive) heart failure: Secondary | ICD-10-CM | POA: Diagnosis not present

## 2023-11-21 DIAGNOSIS — J441 Chronic obstructive pulmonary disease with (acute) exacerbation: Secondary | ICD-10-CM | POA: Diagnosis not present

## 2023-11-21 DIAGNOSIS — N1831 Chronic kidney disease, stage 3a: Secondary | ICD-10-CM | POA: Diagnosis not present

## 2023-11-21 DIAGNOSIS — J9611 Chronic respiratory failure with hypoxia: Secondary | ICD-10-CM | POA: Diagnosis not present

## 2023-11-21 DIAGNOSIS — J470 Bronchiectasis with acute lower respiratory infection: Secondary | ICD-10-CM | POA: Diagnosis not present

## 2023-11-21 DIAGNOSIS — J123 Human metapneumovirus pneumonia: Secondary | ICD-10-CM | POA: Diagnosis not present

## 2023-11-21 DIAGNOSIS — J9621 Acute and chronic respiratory failure with hypoxia: Secondary | ICD-10-CM | POA: Diagnosis not present

## 2023-11-21 DIAGNOSIS — I130 Hypertensive heart and chronic kidney disease with heart failure and stage 1 through stage 4 chronic kidney disease, or unspecified chronic kidney disease: Secondary | ICD-10-CM | POA: Diagnosis not present

## 2023-11-21 DIAGNOSIS — M1030 Gout due to renal impairment, unspecified site: Secondary | ICD-10-CM | POA: Diagnosis not present

## 2023-11-21 DIAGNOSIS — I2510 Atherosclerotic heart disease of native coronary artery without angina pectoris: Secondary | ICD-10-CM | POA: Diagnosis not present

## 2023-11-26 DIAGNOSIS — M1030 Gout due to renal impairment, unspecified site: Secondary | ICD-10-CM | POA: Diagnosis not present

## 2023-11-26 DIAGNOSIS — J441 Chronic obstructive pulmonary disease with (acute) exacerbation: Secondary | ICD-10-CM | POA: Diagnosis not present

## 2023-11-26 DIAGNOSIS — J9621 Acute and chronic respiratory failure with hypoxia: Secondary | ICD-10-CM | POA: Diagnosis not present

## 2023-11-26 DIAGNOSIS — I130 Hypertensive heart and chronic kidney disease with heart failure and stage 1 through stage 4 chronic kidney disease, or unspecified chronic kidney disease: Secondary | ICD-10-CM | POA: Diagnosis not present

## 2023-11-26 DIAGNOSIS — N1831 Chronic kidney disease, stage 3a: Secondary | ICD-10-CM | POA: Diagnosis not present

## 2023-11-26 DIAGNOSIS — J123 Human metapneumovirus pneumonia: Secondary | ICD-10-CM | POA: Diagnosis not present

## 2023-11-26 DIAGNOSIS — I2510 Atherosclerotic heart disease of native coronary artery without angina pectoris: Secondary | ICD-10-CM | POA: Diagnosis not present

## 2023-11-26 DIAGNOSIS — J470 Bronchiectasis with acute lower respiratory infection: Secondary | ICD-10-CM | POA: Diagnosis not present

## 2023-11-26 DIAGNOSIS — I5033 Acute on chronic diastolic (congestive) heart failure: Secondary | ICD-10-CM | POA: Diagnosis not present

## 2023-12-01 DIAGNOSIS — R269 Unspecified abnormalities of gait and mobility: Secondary | ICD-10-CM | POA: Diagnosis not present

## 2023-12-22 DIAGNOSIS — J9611 Chronic respiratory failure with hypoxia: Secondary | ICD-10-CM | POA: Diagnosis not present

## 2024-01-01 DIAGNOSIS — R269 Unspecified abnormalities of gait and mobility: Secondary | ICD-10-CM | POA: Diagnosis not present

## 2024-01-21 DIAGNOSIS — J9611 Chronic respiratory failure with hypoxia: Secondary | ICD-10-CM | POA: Diagnosis not present
